# Patient Record
Sex: Male | Born: 1950 | Race: Black or African American | Hispanic: No | Marital: Married | Smoking: Current every day smoker
Health system: Southern US, Community
[De-identification: ages and names within clinical notes are randomized; demographics above are authoritative.]

## PROBLEM LIST (undated history)

## (undated) DIAGNOSIS — N289 Disorder of kidney and ureter, unspecified: Secondary | ICD-10-CM

## (undated) DIAGNOSIS — E785 Hyperlipidemia, unspecified: Secondary | ICD-10-CM

## (undated) DIAGNOSIS — K219 Gastro-esophageal reflux disease without esophagitis: Secondary | ICD-10-CM

## (undated) DIAGNOSIS — I10 Essential (primary) hypertension: Secondary | ICD-10-CM

## (undated) DIAGNOSIS — G4700 Insomnia, unspecified: Secondary | ICD-10-CM

## (undated) DIAGNOSIS — K760 Fatty (change of) liver, not elsewhere classified: Secondary | ICD-10-CM

## (undated) HISTORY — DX: Essential (primary) hypertension: I10

## (undated) HISTORY — DX: Fatty (change of) liver, not elsewhere classified: K76.0

## (undated) HISTORY — DX: Hyperlipidemia, unspecified: E78.5

## (undated) HISTORY — PX: OTHER SURGICAL HISTORY: SHX169

## (undated) HISTORY — PX: BACK SURGERY: SHX140

---

## 2002-08-25 ENCOUNTER — Encounter: Payer: Self-pay | Admitting: Family Medicine

## 2002-08-25 ENCOUNTER — Ambulatory Visit (HOSPITAL_COMMUNITY): Admission: RE | Admit: 2002-08-25 | Discharge: 2002-08-25 | Payer: Self-pay | Admitting: Family Medicine

## 2002-09-06 ENCOUNTER — Ambulatory Visit (HOSPITAL_COMMUNITY): Admission: RE | Admit: 2002-09-06 | Discharge: 2002-09-06 | Payer: Self-pay | Admitting: Urology

## 2002-09-06 ENCOUNTER — Encounter: Payer: Self-pay | Admitting: Urology

## 2002-10-23 ENCOUNTER — Ambulatory Visit (HOSPITAL_COMMUNITY): Admission: RE | Admit: 2002-10-23 | Discharge: 2002-10-23 | Payer: Self-pay | Admitting: Urology

## 2002-10-23 ENCOUNTER — Encounter: Payer: Self-pay | Admitting: Urology

## 2003-04-01 ENCOUNTER — Emergency Department (HOSPITAL_COMMUNITY): Admission: EM | Admit: 2003-04-01 | Discharge: 2003-04-01 | Payer: Self-pay | Admitting: Emergency Medicine

## 2003-04-02 ENCOUNTER — Emergency Department (HOSPITAL_COMMUNITY): Admission: EM | Admit: 2003-04-02 | Discharge: 2003-04-02 | Payer: Self-pay | Admitting: Emergency Medicine

## 2006-04-12 ENCOUNTER — Ambulatory Visit (HOSPITAL_COMMUNITY): Admission: RE | Admit: 2006-04-12 | Discharge: 2006-04-12 | Payer: Self-pay | Admitting: Family Medicine

## 2006-04-15 ENCOUNTER — Ambulatory Visit (HOSPITAL_COMMUNITY): Admission: RE | Admit: 2006-04-15 | Discharge: 2006-04-15 | Payer: Self-pay | Admitting: Family Medicine

## 2007-01-13 HISTORY — PX: COLONOSCOPY: SHX174

## 2007-01-14 ENCOUNTER — Ambulatory Visit (HOSPITAL_COMMUNITY): Admission: RE | Admit: 2007-01-14 | Discharge: 2007-01-14 | Payer: Self-pay | Admitting: General Surgery

## 2007-02-21 ENCOUNTER — Emergency Department (HOSPITAL_COMMUNITY): Admission: EM | Admit: 2007-02-21 | Discharge: 2007-02-21 | Payer: Self-pay | Admitting: Emergency Medicine

## 2007-11-28 ENCOUNTER — Ambulatory Visit (HOSPITAL_COMMUNITY): Admission: RE | Admit: 2007-11-28 | Discharge: 2007-11-28 | Payer: Self-pay | Admitting: Family Medicine

## 2008-06-11 ENCOUNTER — Ambulatory Visit (HOSPITAL_COMMUNITY): Admission: RE | Admit: 2008-06-11 | Discharge: 2008-06-11 | Payer: Self-pay | Admitting: General Surgery

## 2008-06-29 ENCOUNTER — Ambulatory Visit (HOSPITAL_COMMUNITY): Admission: RE | Admit: 2008-06-29 | Discharge: 2008-06-29 | Payer: Self-pay | Admitting: Neurosurgery

## 2008-07-10 IMAGING — CR DG CHEST 1V PORT
1 series · 1 of 1 positions shown · non-contrast
Comparison: 04/15/2006

CLINICAL DATA: Chest pain hypertension.

CHEST - 1 VIEW

[view not recorded]
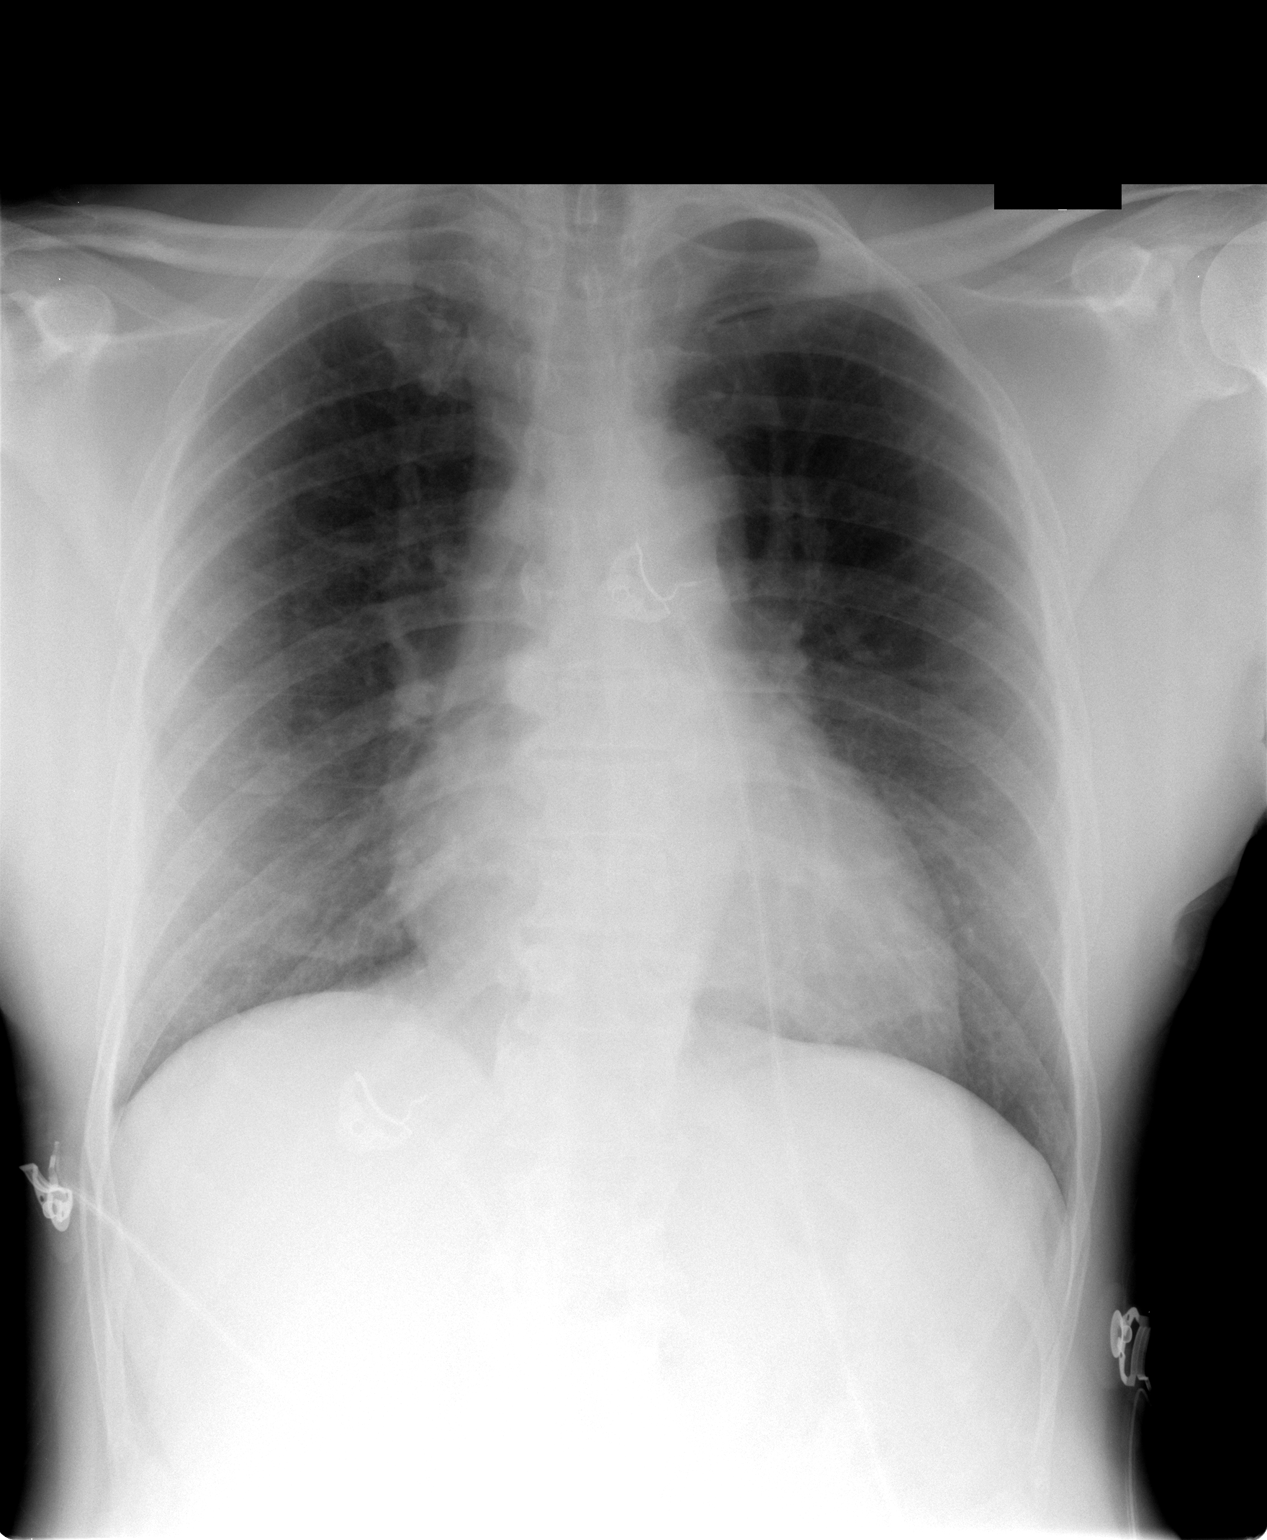

[1 of 1 positions shown; findings below may reference images not displayed]

FINDINGS: Mild S-shaped spinal curvature with advanced thoracic spondylosis.

Midline trachea. Mild cardiomegaly. Normal mediastinal contours. Sharp
costophrenic angles.

No pneumothorax or congestive failure. Clear lungs.

IMPRESSION

1. Mild cardiomegaly without acute cardiopulmonary disease.
2. Advanced thoracic spondylosis.

## 2008-08-16 ENCOUNTER — Ambulatory Visit (HOSPITAL_COMMUNITY): Admission: RE | Admit: 2008-08-16 | Discharge: 2008-08-17 | Payer: Self-pay | Admitting: Neurosurgery

## 2009-05-28 ENCOUNTER — Emergency Department (HOSPITAL_COMMUNITY): Admission: EM | Admit: 2009-05-28 | Discharge: 2009-05-28 | Payer: Self-pay | Admitting: Emergency Medicine

## 2009-12-27 ENCOUNTER — Ambulatory Visit (HOSPITAL_COMMUNITY): Admission: RE | Admit: 2009-12-27 | Discharge: 2009-12-27 | Payer: Self-pay | Admitting: Family Medicine

## 2010-11-28 ENCOUNTER — Ambulatory Visit (HOSPITAL_COMMUNITY)
Admission: RE | Admit: 2010-11-28 | Discharge: 2010-11-28 | Disposition: A | Discharge status: Home / Self Care | Payer: BC Managed Care – PPO | Source: Ambulatory Visit | Attending: Family Medicine | Admitting: Family Medicine

## 2010-11-28 ENCOUNTER — Other Ambulatory Visit (HOSPITAL_COMMUNITY): Payer: Self-pay | Admitting: Family Medicine

## 2010-11-28 DIAGNOSIS — K7689 Other specified diseases of liver: Secondary | ICD-10-CM | POA: Insufficient documentation

## 2010-11-28 DIAGNOSIS — R1011 Right upper quadrant pain: Secondary | ICD-10-CM

## 2010-11-28 DIAGNOSIS — R1013 Epigastric pain: Secondary | ICD-10-CM | POA: Insufficient documentation

## 2010-12-10 ENCOUNTER — Emergency Department (HOSPITAL_COMMUNITY)
Admission: EM | Admit: 2010-12-10 | Discharge: 2010-12-10 | Disposition: A | Discharge status: Home / Self Care | Payer: BC Managed Care – PPO | Attending: Emergency Medicine | Admitting: Emergency Medicine

## 2010-12-10 ENCOUNTER — Emergency Department (HOSPITAL_COMMUNITY): Payer: BC Managed Care – PPO

## 2010-12-10 DIAGNOSIS — Z79899 Other long term (current) drug therapy: Secondary | ICD-10-CM | POA: Insufficient documentation

## 2010-12-10 DIAGNOSIS — R10811 Right upper quadrant abdominal tenderness: Secondary | ICD-10-CM | POA: Insufficient documentation

## 2010-12-10 DIAGNOSIS — R609 Edema, unspecified: Secondary | ICD-10-CM | POA: Insufficient documentation

## 2010-12-10 DIAGNOSIS — E119 Type 2 diabetes mellitus without complications: Secondary | ICD-10-CM | POA: Insufficient documentation

## 2010-12-10 DIAGNOSIS — M2550 Pain in unspecified joint: Secondary | ICD-10-CM | POA: Insufficient documentation

## 2010-12-10 DIAGNOSIS — R109 Unspecified abdominal pain: Secondary | ICD-10-CM | POA: Insufficient documentation

## 2010-12-10 DIAGNOSIS — I10 Essential (primary) hypertension: Secondary | ICD-10-CM | POA: Insufficient documentation

## 2010-12-10 LAB — COMPREHENSIVE METABOLIC PANEL
ALT: 20 U/L (ref 0–53)
AST: 21 U/L (ref 0–37)
Albumin: 4.2 g/dL (ref 3.5–5.2)
Calcium: 9.7 mg/dL (ref 8.4–10.5)
GFR calc Af Amer: >60 mL/min (ref >60)
Sodium: 139 mEq/L (ref 135–145)
Total Protein: 7.8 g/dL (ref 6.0–8.3)

## 2010-12-10 LAB — URINALYSIS, ROUTINE W REFLEX MICROSCOPIC
Bilirubin Urine: NEGATIVE
Hgb urine dipstick: NEGATIVE
Nitrite: NEGATIVE
Specific Gravity, Urine: <1.005 — ABNORMAL LOW (ref 1.005–1.030)
pH: 5 (ref 5.0–8.0)

## 2010-12-10 LAB — CBC
MCHC: 33.2 g/dL (ref 30.0–36.0)
Platelets: 221 10*3/uL (ref 150–400)
RDW: 13.7 % (ref 11.5–15.5)

## 2010-12-10 LAB — DIFFERENTIAL
Basophils Absolute: 0.1 10*3/uL (ref 0.0–0.1)
Basophils Relative: 1 % (ref 0–1)
Eosinophils Relative: 2 % (ref 0–5)
Monocytes Absolute: 0.5 10*3/uL (ref 0.1–1.0)

## 2010-12-10 LAB — LIPASE, BLOOD: Lipase: 19 U/L (ref 11–59)

## 2010-12-10 MED ORDER — IOHEXOL 300 MG/ML  SOLN
100.0000 mL | Freq: Once | INTRAMUSCULAR | Status: AC | PRN
  Administered 2010-12-10: 100 mL via INTRAVENOUS

## 2011-01-07 ENCOUNTER — Encounter: Payer: Self-pay | Admitting: Gastroenterology

## 2011-01-07 ENCOUNTER — Ambulatory Visit (INDEPENDENT_AMBULATORY_CARE_PROVIDER_SITE_OTHER): Payer: BC Managed Care – PPO | Admitting: Gastroenterology

## 2011-01-07 VITALS — BP 190/100 | HR 83 | Temp 98.3°F | Ht 67.0 in | Wt 145.4 lb

## 2011-01-07 DIAGNOSIS — I10 Essential (primary) hypertension: Secondary | ICD-10-CM

## 2011-01-07 DIAGNOSIS — R1011 Right upper quadrant pain: Secondary | ICD-10-CM | POA: Insufficient documentation

## 2011-01-07 MED ORDER — IBUPROFEN 200 MG PO TABS: ORAL_TABLET | ORAL | Status: DC

## 2011-01-07 MED ORDER — ESOMEPRAZOLE MAGNESIUM 40 MG PO CPDR: 40.0000 mg | DELAYED_RELEASE_CAPSULE | Freq: Every day | ORAL | Status: DC

## 2011-01-07 NOTE — Progress Notes (Signed)
Primary Care Physician:  Colette Ribas, MD  Primary Gastroenterologist:  Dr. Roetta Sessions  Chief Complaint  Patient presents with  . Abdominal Pain    right side    HPI:  Chase Briggs is a 60 y.o. male here for further evaluation right upper quadrant abdominal pain of 3 weeks' duration. He saw Dr. Phillips Odor for this pain .LUQ pain began about 3 week or so. Burning pain. Notices more with heavy lifting. Some better with ace bandage applied. Sometimes feels little sore there. No heartburn. Took pill for indigestion for three days, given by ED. No n/v. BM regular. No melena, brbpr. Appetite not good for long time. Weight stable. No dysuria, hematuria. No rash. Taking some ibuprofen occasionally for this pain. Not pp.   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 Lumbar surgery two years ago, sometimes feels pain on right side (flank).    Abdominal ultrasound 11/28/2010 showed hepatic steatosis but otherwise unremarkable. 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 Urinalysis was negative. Lipase 19. LFTs normal. CBC normal.  Current Outpatient Prescriptions  Medication Sig Dispense Refill  . amLODipine-valsartan (EXFORGE) 5-320 MG per tablet Take 1 tablet by mouth daily.        Marland Kitchen glyBURIDE-metformin (GLUCOVANCE) 5-500 MG per tablet Take 1 tablet by mouth daily with breakfast.        . esomeprazole (NEXIUM) 40 MG capsule Take 1 capsule (40 mg total) by mouth daily.  30 capsule  0  . ibuprofen (ADVIL) 200 MG tablet Take two tablets three times daily for ten days.      . simvastatin (ZOCOR) 20 MG tablet Takes it every now again but not everyday        Allergies as of 01/07/2011  . (No Known Allergies)    Past Medical History  Diagnosis Date  . HTN (hypertension)   . Diabetes mellitus   . Hyperlipidemia     Past Surgical History  Procedure Date  . Back surgery   . Colonoscopy     Dr. [name], normal, per  patient around 2010    Family History  Problem Relation Age of Onset  . Colon cancer Neg Hx   . Liver disease Neg Hx     History   Social History  . Marital Status: Married    Spouse Name: N/A    Number of Children: 1  . Years of Education: N/A   Occupational History  .      [employer], heavy lifting   Social History Main Topics  . Smoking status: Current Everyday Smoker -- 1.0 packs/day  . Smokeless tobacco: Not on file  . Alcohol Use: No  . Drug Use: No  . Sexually Active: Not on file      ROS:  General: Negative for anorexia, weight loss, fever, chills, fatigue, weakness. Eyes: Negative for vision changes.  ENT: Negative for hoarseness, difficulty swallowing , nasal congestion. CV: Negative for chest pain, angina, palpitations, dyspnea on exertion, peripheral edema.  Respiratory: Negative for dyspnea at rest, dyspnea on exertion, cough, sputum, wheezing.  GI: See history of present illness. GU:  Negative for dysuria, hematuria, urinary incontinence, urinary frequency, nocturnal urination.  MS: Negative for joint pain. Intermittent low back pain especially in am.  Derm: Negative for rash or itching.  Neuro: Negative for weakness, abnormal sensation, seizure, frequent headaches, memory loss, confusion.  Psych: Negative for anxiety, depression, suicidal ideation, hallucinations.  Endo: Negative for unusual weight change.  Heme:  Negative for bruising or bleeding. Allergy: Negative for rash or hives.    Physical Examination:  BP 190/100  Pulse 83  Temp(Src) 98.3 F (36.8 C) (Tympanic)  Ht 5\' 7"  (1.702 m)  Wt 145 lb 6.4 oz (65.953 kg)  BMI 22.77 kg/m2  SpO2 100%   General: Well-nourished, well-developed in no acute distress.  Head: Normocephalic, atraumatic.   Eyes: Conjunctiva pink, no icterus. Mouth: Oropharyngeal mucosa moist and pink , no lesions erythema or exudate. Neck: Supple without thyromegaly, masses, or lymphadenopathy.  Lungs: Clear to auscultation  bilaterally.  Heart: Regular rate and rhythm, no murmurs rubs or gallops.  Abdomen: Bowel sounds are normal, nontender, nondistended, no hepatosplenomegaly or masses, no abdominal bruits or hernia , no rebound or guarding.   Extremities: No lower extremity edema.  Neuro: Alert and oriented x 4 , grossly normal neurologically.  Skin: Warm and dry, no rash or jaundice.   Psych: Alert and cooperative, normal mood and affect.

## 2011-01-07 NOTE — Assessment & Plan Note (Signed)
Blood pressure up this visit. Patient states he has "whitecoat syndrome". He will check his blood pressures at home and if remain elevated with systolic greater than 160 or diastolic greater than 100 he will call his PCP or go to the emergency department if elevated like it is currently. He will keep a record and discuss further with his PCP.

## 2011-01-07 NOTE — Assessment & Plan Note (Signed)
Three-week history of right upper quadrant abdominal pain, worse with movement, unrelated to meals. Suspect musculoskeletal source. Abdominal ultrasound and abdominal pelvic CT felt to reveal a source of his pain. Ibuprofen 400 mg 3 times a day for 10 days. Nexium 40 mg daily for one month. Samples provided. He will call with persistent symptoms.

## 2011-01-07 NOTE — Patient Instructions (Signed)
I suspect your right-sided pain is due to musculoskeletal component.  Please take ibuprofen 400 mg 3 times a day for the next 10 days. Hold for worsening abdominal pain. Take Nexium 40 mg daily for the next 30 days. This will protect her stomach as well as treat any gastritis or GERD. Call if symptoms persist.

## 2011-01-08 NOTE — Progress Notes (Signed)
Cc to PCP 

## 2011-01-27 NOTE — Op Note (Signed)
NAME:  Chase Briggs, Chase Briggs          ACCOUNT NO.:  [id number]   MEDICAL RECORD NO.:  [id number]          PATIENT TYPE:  OIB   LOCATION:  3535                         FACILITY:  [facility]   PHYSICIAN:  Danae Orleans. Venetia Maxon, M.D.  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   PROCEDURE:  Right L3-4 far-lateral microdiskectomy with microdissection.   SURGEON:  Danae Orleans. Venetia Maxon, MD   ASSISTANT:  Georgiann Cocker, RN and Clydene Fake, MD   ANESTHESIA:  General endotracheal anesthesia.   BLOOD LOSS:  Minimal.   COMPLICATIONS:  None.   DISPOSITION:  Recovery.   INDICATIONS:  Chase Briggs is a 60 year old man with right L3  radiculopathy with a large extraforaminal disk herniation at the L3-4  level.  It was elected to take him to surgery for far-lateral right L3-4  microdiskectomy.   PROCEDURE:  Chase Briggs was brought to the operating room.  Following satisfactory and uncomplicated induction of general  endotracheal anesthesia and placement of intravenous lines, the patient  was placed in the prone position on the operating table using the Wilson  frame.  His low back was prepped and draped in the usual sterile fashion  using C-arm fluoroscopy.  The L3-4 level was identified and incision was  made in the midline after infiltrating the skin with local lidocaine to  the lumbodorsal fascia which was incised sharply on the right side of  midline and flapped medially.  Subperiosteal dissection was performed  exposing the L3-4 and L2-3 facet joints and the L3 pars.  The self-  retaining retractor was placed and intraoperative x-ray confirmed  correct  orientation.  The outer rim of the pars was then drilled away  along with the superior aspect of the L3-4 facet joint.  Microscope was  brought into field, 2 and 3-mm Kerrison rongeurs were used to complete  bone removal.  The intertransverse ligament was then incised and removed  in piecemeal fashion with Kerrison rongeurs exposing the L3 nerve root  extraforaminally.  This was mobilized very gently laterally to expose  the interspace where there was a significant amount of herniated disk  material.  The interspace was incised with 15 blade and multiple large  fragments of disk material including more laterally.  The herniated disk  materials were removed.  Epstein curettes were used to gently remove any  residual adherent to the disk material.  A variety ball probes were then  inserted along the course of the nerve and this was felt to be  significantly decompressed.  Hemostasis was assured with Gelfoam soaked  in thrombin and was irrigated and bathed in Depo-Medrol and fentanyl.  The fascia was closed with 0 Vicryl sutures.  The subcutaneous tissues  were reapproximated with 2-0 Vicryl interrupted inverted sutures and  skin edges were  reapproximated with interrupted 3-0 Vicryl subcuticular stitch.  The  wound was dressed with Dermabond.  The patient was extubated in the  operating room and taken to recovery in stable and satisfactory  condition having tolerated the procedure well.  Counts were correct at  the end of the case.      Danae Orleans. Venetia Maxon, M.D.  Electronically Signed     JDS/MEDQ  D:  08/16/2008  T:  08/17/2008  Job:  [id number]

## 2011-01-27 NOTE — H&P (Signed)
NAME:  Chase Briggs, Chase Briggs          ACCOUNT NO.:  [id number]   MEDICAL RECORD NO.:  [id number]         PATIENT TYPE:  PAMB   LOCATION:  DAY                           FACILITY:  [facility]   PHYSICIAN:  [name], M.D.  DATE OF BIRTH:  1951-05-10   DATE OF ADMISSION:  DATE OF DISCHARGE:  LH                              HISTORY & PHYSICAL   CHIEF COMPLAINT:  Sebaceous cyst, scalp.   HISTORY OF PRESENT ILLNESS:  The patient is a 60 year old black male who  presents with an enlarging sebaceous cyst on the left posterior aspect  of his scalp.  It is causing him discomfort.  No recent drainage has  been noted.   PAST MEDICAL HISTORY:  1. Noninsulin-dependent diabetes mellitus.  2. Hypertension.   PAST SURGICAL HISTORY:  Colonoscopy.   CURRENT MEDICATIONS:  Glyburide/metformin, Benicar.   ALLERGIES:  No known drug allergies.   REVIEW OF SYSTEMS:  The patient smokes 1/2 pack of cigarettes a day.  Denies any alcohol use.  Denies any other cardiopulmonary difficulties  or bleeding disorders.   PHYSICAL EXAMINATION:  GENERAL:  The patient is a well-developed, well-  nourished black male in no acute distress.  LUNGS:  Clear to auscultation with equal breath sounds bilaterally.  HEART:  Regular rate and rhythm without S1, S3, or murmurs.  SCALP:  Reveals a 2-cm sebaceous cyst along the left posterior scalp  region.  No drainage is noted.   IMPRESSION:  Sebaceous cyst, scalp.   PLAN:  The patient is scheduled for excision of the sebaceous cyst on  the scalp on June 11, 2008.  The risks and benefits of the  procedure including bleeding, infection, and recurrence of the cyst were  fully explained to the patient, gave informed consent.      [name], M.D.  Electronically Signed     MAJ/MEDQ  D:  06/07/2008  T:  06/08/2008  Job:  [id number]   cc:   Patrica Duel, M.D.  Fax: [phone]

## 2011-01-27 NOTE — Op Note (Signed)
NAME:  Chase Briggs, Chase Briggs          ACCOUNT NO.:  [id number]   MEDICAL RECORD NO.:  [id number]          PATIENT TYPE:  AMB   LOCATION:  DAY                           FACILITY:  [facility]   PHYSICIAN:  [name], M.D.  DATE OF BIRTH:  02/02/1951   DATE OF PROCEDURE:  06/11/2008  DATE OF DISCHARGE:                               OPERATIVE REPORT   PREOPERATIVE DIAGNOSIS:  Sebaceous cyst, scalp.   POSTOPERATIVE DIAGNOSIS:  Sebaceous cyst, scalp.   PROCEDURE:  Excision of 2-cm sebaceous cyst, scalp.   SURGEON:  [name], MD   ANESTHESIA:  General.   INDICATIONS:  The patient is a 60 year old black male, who presents with  enlarging sebaceous cyst on the left posterior scalp region.  The risks  and benefits of the procedure including bleeding, infection, and  recurrence of the cyst were fully explained to the patient, who gave  informed consent.   PROCEDURE NOTE:  The patient was placed in the supine position.  After  general anesthesia was administered, the left posteroauricular scalp  region was prepped and draped using the usual sterile technique with  Betadine.  Surgical site confirmation was performed.   A 2-cm sebaceous cyst with punctum was present.  An elliptical incision  was made around the punctum.  The cyst was then removed without  difficulty.  The cyst was disposed off, thus no pathological specimen  was sent.  The wound was irrigated with normal saline.  Sensorcaine 0.5%  was instilled into the surrounding wound.  The incision was closed using  4-0 nylon interrupted sutures.  Betadine ointment and dry sterile  dressing were applied.   All tape and needle counts were correct at the end of the procedure.  The patient was awakened and transferred to PACU in stable condition.   COMPLICATIONS:  None.   SPECIMEN:  None.   BLOOD LOSS:  Minimal.      [name], M.D.  Electronically Signed     MAJ/MEDQ  D:  06/11/2008  T:  06/12/2008  Job:   [id number]   cc:   Patrica Duel, M.D.  Fax: [phone]

## 2011-01-30 NOTE — H&P (Signed)
NAME:  Chase Briggs, Chase Briggs          ACCOUNT NO.:  [id number]   MEDICAL RECORD NO.:  [id number]          PATIENT TYPE:  AMB   LOCATION:  DAY                           FACILITY:  [facility]   PHYSICIAN:  [name], M.D.  DATE OF BIRTH:  1951/02/21   DATE OF ADMISSION:  DATE OF DISCHARGE:  LH                              HISTORY & PHYSICAL   CHIEF COMPLAINT:  Need for screening colonoscopy.   HISTORY OF PRESENT ILLNESS:  The patient is a 60 year old black male who  was referred for endoscopic evaluation.  He needs a colonoscopy for  screening purposes.  No abdominal pain, weight loss, nausea, vomiting,  diarrhea, constipation, melena, or hematochezia have been noted.  He has  never had a colonoscopy.  There is no family history of colon carcinoma.   PAST MEDICAL HISTORY:  Includes hypertension and noninsulin dependent  diabetes mellitus.   PAST SURGICAL HISTORY:  Unremarkable.   CURRENT MEDICATIONS:  Amlodipine, Metoprolol, Glyburide,  hydrochlorothiazide, and simvastatin.   ALLERGIES:  NO KNOWN DRUG ALLERGIES.   REVIEW OF SYSTEMS:  The patient smokes daily.  Denies any alcohol use.  He denies any other cardiopulmonary difficulties or bleeding disorders.   PHYSICAL EXAMINATION:  GENERAL:  The patient is a well-developed, well-  nourished black male in no acute distress.  LUNGS:  Clear to auscultation with equal breath sounds bilaterally.  HEART:  The heart examination reveals a regular rate and rhythm without  S3, S4, or murmurs.  ABDOMEN:  Soft, nontender, nondistended.  No hepatosplenomegaly or  masses are noted.  RECTAL:  Examination deferred to the procedure.   IMPRESSION:  Need for screening colonoscopy.   PLAN:  The patient is scheduled for a colonoscopy on 01/21/2007.  The  risks and benefits of the procedure including bleeding and perforation  were fully explained to the patient, he gave informed consent.      [name], M.D.  Electronically Signed     MAJ/MEDQ  D:  01/06/2007  T:  01/06/2007  Job:  [id number]   cc:   Short Stay at [facility]   Patrica Duel, M.D.  Fax: [phone]

## 2011-05-20 ENCOUNTER — Ambulatory Visit (INDEPENDENT_AMBULATORY_CARE_PROVIDER_SITE_OTHER): Payer: BC Managed Care – PPO | Admitting: Gastroenterology

## 2011-05-20 ENCOUNTER — Encounter: Payer: Self-pay | Admitting: Gastroenterology

## 2011-05-20 DIAGNOSIS — R0789 Other chest pain: Secondary | ICD-10-CM

## 2011-05-20 DIAGNOSIS — R079 Chest pain, unspecified: Secondary | ICD-10-CM

## 2011-05-20 DIAGNOSIS — R1011 Right upper quadrant pain: Secondary | ICD-10-CM

## 2011-05-20 DIAGNOSIS — Z1211 Encounter for screening for malignant neoplasm of colon: Secondary | ICD-10-CM | POA: Insufficient documentation

## 2011-05-20 NOTE — Assessment & Plan Note (Signed)
Some musculoskeletal component but better on PPI. No alarm symptoms at this time. Continue lansoprazole one to two daily.

## 2011-05-20 NOTE — Patient Instructions (Signed)
Please take advil 200mg , 2-3 every 8 hours X 10 days for pain. Please take lansoprazole one tablet twice a day 30 minutes before breakfast and evening meal. Please call in two weeks and let us know how you are doing. If needed, we will order CXR at that time. If you develop difficulty breathing or worsening chest pain, go to ER.

## 2011-05-20 NOTE — Progress Notes (Signed)
Primary Care Physician: Colette Ribas, MD  Primary Gastroenterologist:  Jonette Eva, MD    Chief Complaint  Patient presents with  . Abdominal Pain    pain in the center of chest with sore under right breast    HPI: Chase Briggs is a 60 y.o. male here for f/u. Seen in 12/2010 for RUQ pain thought to be secondary to musculoskeletal source. Abd U/S and CT A/P unrevealing. Patient started on ibuprofen and Nexium. He states his pain went away after couple of weeks. Recently had some recurrent pain. Started lansoprazole OTC which seems to help. He is also concerned about pain that originates in sternum and goes down right ribs. No SOB. No diaphoresis. Worse with lifting. No n/v. BM normal . No melena, brbpr.   Current Outpatient Prescriptions  Medication Sig Dispense Refill  . amLODipine-valsartan (EXFORGE) 5-320 MG per tablet Take 1 tablet by mouth daily.        Marland Kitchen glyBURIDE-metformin (GLUCOVANCE) 5-500 MG per tablet Take 1 tablet by mouth daily with breakfast.        . ibuprofen (ADVIL) 200 MG tablet Take two tablets three times daily for ten days.      . lansoprazole (PREVACID) 15 MG capsule Take 15 mg by mouth daily.        . simvastatin (ZOCOR) 20 MG tablet Takes it every now again but not everyday        Allergies as of 05/20/2011  . (No Known Allergies)    ROS:  General: Negative for anorexia, weight loss, fever, chills, fatigue, weakness. ENT: Negative for hoarseness, difficulty swallowing , nasal congestion. CV: Negative for chest pain, angina, palpitations, dyspnea on exertion, peripheral edema.  Respiratory: Negative for dyspnea at rest, dyspnea on exertion, cough, sputum, wheezing.  GI: See history of present illness. GU:  Negative for dysuria, hematuria, urinary incontinence, urinary frequency, nocturnal urination.  Endo: Negative for unusual weight change.    Physical Examination:   BP 174/87  Pulse 86  Temp(Src) 97.7 F (36.5 C) (Temporal)  Ht 5\' 7"   (1.702 m)  Wt 144 lb 3.2 oz (65.409 kg)  BMI 22.59 kg/m2  General: Well-nourished, well-developed in no acute distress.  Eyes: No icterus. Mouth: Oropharyngeal mucosa moist and pink , no lesions erythema or exudate. Lungs: Clear to auscultation bilaterally. Palpation over sternum and chest wall without obvious abnormalities. Reproduction of tenderness notes.  Heart: Regular rate and rhythm, no murmurs rubs or gallops.  Abdomen: Bowel sounds are normal, nontender, nondistended, no hepatosplenomegaly or masses, no abdominal bruits or hernia , no rebound or guarding.   Extremities: No lower extremity edema. No clubbing or deformities. Neuro: Alert and oriented x 4   Skin: Warm and dry, no jaundice.   Psych: Alert and cooperative, normal mood and affect.

## 2011-05-20 NOTE — Assessment & Plan Note (Signed)
Ibuprofen 400-600mg  TID for 10 days. He will call if no better. Consider CXR as next step.

## 2011-05-20 NOTE — Assessment & Plan Note (Signed)
Patient states last TCS was in 2010 by Dr. [name]. Retrieve report.

## 2011-05-21 NOTE — Progress Notes (Signed)
agree

## 2011-05-21 NOTE — Progress Notes (Signed)
Cc to PCP 

## 2011-05-28 NOTE — Progress Notes (Signed)
Please let pt know, we have tried to find record of colonoscopy he said he had done by Dr. [name] in 2010. [facility] states there are no records. Please find out more information from pt or have him to get copy of report so further recommendations can be made regarding when next TCS is due.

## 2011-05-29 NOTE — Progress Notes (Signed)
Spoke with Debbie @ Dr. [name]. Pt had a colonoscopy in 2008 by Dr. [name], no biopsy.   She is faxing over the report. ( Dr. [name] did see him in 2009 for a cyst on his scalp)

## 2011-06-15 ENCOUNTER — Encounter: Payer: Self-pay | Admitting: Gastroenterology

## 2011-06-15 LAB — CBC
Hemoglobin: 13.8
MCHC: 34.1
RBC: 4.18 — ABNORMAL LOW

## 2011-06-15 LAB — BASIC METABOLIC PANEL
CO2: 28
Calcium: 9.9
Glucose, Bld: 165 — ABNORMAL HIGH
Sodium: 137

## 2011-06-15 NOTE — Progress Notes (Signed)
Please let pt know. Reviewed record from Dr. [name]. Next TCS due 01/2017. Please NIC.

## 2011-06-15 NOTE — Progress Notes (Signed)
Pt informed. Will let Darl Pikes know to NIC.

## 2011-06-16 LAB — BASIC METABOLIC PANEL
BUN: 9 mg/dL (ref 6–23)
CO2: 26 mEq/L (ref 19–32)
Chloride: 100 mEq/L (ref 96–112)
Glucose, Bld: 325 mg/dL — ABNORMAL HIGH (ref 70–99)
Potassium: 4 mEq/L (ref 3.5–5.1)

## 2011-06-16 LAB — CBC
HCT: 45.7 % (ref 39.0–52.0)
MCHC: 33.2 g/dL (ref 30.0–36.0)
MCV: 96.6 fL (ref 78.0–100.0)
Platelets: 190 10*3/uL (ref 150–400)
RDW: 13.3 % (ref 11.5–15.5)

## 2011-06-16 NOTE — Progress Notes (Signed)
Reminder in epic to have next tcs in 01/2017

## 2011-06-19 ENCOUNTER — Encounter: Payer: Self-pay | Admitting: General Surgery

## 2011-06-19 LAB — GLUCOSE, CAPILLARY
Glucose-Capillary: 248 mg/dL — ABNORMAL HIGH (ref 70–99)
Glucose-Capillary: 276 mg/dL — ABNORMAL HIGH (ref 70–99)

## 2011-07-02 LAB — BASIC METABOLIC PANEL
GFR calc non Af Amer: >60
Glucose, Bld: 197 — ABNORMAL HIGH
Potassium: 3.5
Sodium: 137

## 2011-07-02 LAB — POCT CARDIAC MARKERS
CKMB, poc: 4.7
Troponin i, poc: <0.05

## 2011-07-02 LAB — DIFFERENTIAL
Basophils Absolute: 0.1
Eosinophils Relative: 1
Lymphocytes Relative: 22
Lymphs Abs: 2.6
Monocytes Absolute: 0.6
Monocytes Relative: 5

## 2011-07-02 LAB — CBC
HCT: 39.9
Hemoglobin: 13.8
RDW: 13.7

## 2011-07-07 ENCOUNTER — Other Ambulatory Visit (HOSPITAL_COMMUNITY): Payer: Self-pay | Admitting: Rehabilitation

## 2011-07-07 DIAGNOSIS — M545 Low back pain: Secondary | ICD-10-CM

## 2011-07-07 DIAGNOSIS — M5137 Other intervertebral disc degeneration, lumbosacral region: Secondary | ICD-10-CM

## 2011-07-10 ENCOUNTER — Ambulatory Visit (HOSPITAL_COMMUNITY)
Admission: RE | Admit: 2011-07-10 | Discharge: 2011-07-10 | Disposition: A | Discharge status: Home / Self Care | Payer: BC Managed Care – PPO | Source: Ambulatory Visit | Attending: Rehabilitation | Admitting: Rehabilitation

## 2011-07-10 DIAGNOSIS — M5137 Other intervertebral disc degeneration, lumbosacral region: Secondary | ICD-10-CM

## 2011-07-10 DIAGNOSIS — M545 Low back pain, unspecified: Secondary | ICD-10-CM | POA: Insufficient documentation

## 2011-07-10 DIAGNOSIS — M25559 Pain in unspecified hip: Secondary | ICD-10-CM | POA: Insufficient documentation

## 2011-07-10 DIAGNOSIS — M5126 Other intervertebral disc displacement, lumbar region: Secondary | ICD-10-CM | POA: Insufficient documentation

## 2011-07-10 DIAGNOSIS — M79609 Pain in unspecified limb: Secondary | ICD-10-CM | POA: Insufficient documentation

## 2012-08-19 ENCOUNTER — Other Ambulatory Visit (HOSPITAL_COMMUNITY): Payer: Self-pay | Admitting: Internal Medicine

## 2012-08-19 DIAGNOSIS — R1011 Right upper quadrant pain: Secondary | ICD-10-CM

## 2012-08-23 ENCOUNTER — Ambulatory Visit (HOSPITAL_COMMUNITY)
Admission: RE | Admit: 2012-08-23 | Discharge: 2012-08-23 | Disposition: A | Discharge status: Home / Self Care | Payer: BC Managed Care – PPO | Source: Ambulatory Visit | Attending: Internal Medicine | Admitting: Internal Medicine

## 2012-08-23 DIAGNOSIS — R1011 Right upper quadrant pain: Secondary | ICD-10-CM | POA: Insufficient documentation

## 2012-08-23 DIAGNOSIS — I10 Essential (primary) hypertension: Secondary | ICD-10-CM | POA: Insufficient documentation

## 2012-08-23 DIAGNOSIS — E119 Type 2 diabetes mellitus without complications: Secondary | ICD-10-CM | POA: Insufficient documentation

## 2012-09-21 ENCOUNTER — Ambulatory Visit (INDEPENDENT_AMBULATORY_CARE_PROVIDER_SITE_OTHER): Payer: BC Managed Care – PPO | Admitting: Urgent Care

## 2012-09-21 ENCOUNTER — Encounter: Payer: Self-pay | Admitting: Urgent Care

## 2012-09-21 VITALS — BP 160/82 | HR 81 | Temp 98.3°F | Ht 67.0 in | Wt 137.6 lb

## 2012-09-21 DIAGNOSIS — K7689 Other specified diseases of liver: Secondary | ICD-10-CM

## 2012-09-21 DIAGNOSIS — Z1211 Encounter for screening for malignant neoplasm of colon: Secondary | ICD-10-CM

## 2012-09-21 DIAGNOSIS — R634 Abnormal weight loss: Secondary | ICD-10-CM

## 2012-09-21 DIAGNOSIS — K760 Fatty (change of) liver, not elsewhere classified: Secondary | ICD-10-CM

## 2012-09-21 DIAGNOSIS — R1011 Right upper quadrant pain: Secondary | ICD-10-CM

## 2012-09-21 LAB — HEPATIC FUNCTION PANEL
Bilirubin, Direct: 0.1 mg/dL (ref 0.0–0.3)
Indirect Bilirubin: 0.4 mg/dL (ref 0.0–0.9)
Total Bilirubin: 0.5 mg/dL (ref 0.3–1.2)

## 2012-09-21 MED ORDER — OMEPRAZOLE 20 MG PO CPDR: 20.0000 mg | DELAYED_RELEASE_CAPSULE | Freq: Every day | ORAL | Status: DC

## 2012-09-21 NOTE — Assessment & Plan Note (Signed)
Unintentional weight loss of 7# documented in past 3 months per our records.  Further evaluation warranted.  See RUQ pain.

## 2012-09-21 NOTE — Progress Notes (Signed)
Referring Provider: Corrie Mckusick, MD Primary Care Physician:  Colette Ribas, MD Primary Gastroenterologist:  Dr. Jonette Eva  Chief Complaint  Patient presents with  . Abdominal Pain    chronic RUQ pain, wt loss, fatty liver    HPI:  Chase Briggs is a 62 y.o. male here for follow up for RUQ pain.  He has chronic RUQ pain that has been worse for the past month.  He describes his pain as daily pain, but it is intermittent.  Pain not necessarily worse after eating, but may be.  Pain is better when he is supine.  Pain radiates to back.  Pain lingers.  Pain 8/10 at worst, usually lasts minutes to hours.  Occasionally feels like pins & needles.  Worse w/ movement.  He lifts & carries stuff at work & it makes it worse.  Denies nausea or vomiting.  Denies fever.  Denies heartburn or indigestion so long as he takes OTC omeprazole 20mg  daily.  He occasionally skips doses.  He has never had an EGD.  He is having BMs BID without rectal bleeding or melena.  Denies diarrhea or constipation.  He has poor appetite.  He has had weight loss 7# since October.  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  Rarely drinks now.  Last ETOH Aug 14, 2012.  Denies illicit drug use.  Diabetes not very well controlled.  However, last Hgb a1c was ok he thinks.  Last labs [facility] 3 mo ago.  Denies cough or SOB.    08/23/12 Abdominal ultrasound-Echogenic liver consistent with fatty infiltration. No focal abnormality. No gallstones.  Past Medical History  Diagnosis Date  . HTN (hypertension)   . Diabetes mellitus   . Hyperlipidemia   . Fatty liver     Past Surgical History  Procedure Date  . Back surgery   . Colonoscopy 01/2007    Dr. [name], reviewed report, mild diverticulosis. Prep adequate. Next TCS due 01/2017    Current Outpatient Prescriptions  Medication Sig Dispense Refill  . amLODipine-valsartan (EXFORGE) 5-320 MG per tablet Take 1 tablet by mouth daily.          Marland Kitchen glyBURIDE-metformin (GLUCOVANCE) 5-500 MG per tablet Take 1 tablet by mouth daily with breakfast.        . ibuprofen (ADVIL) 200 MG tablet Take two tablets three times daily for ten days.      Marland Kitchen omeprazole (PRILOSEC) 20 MG capsule Take 1 capsule (20 mg total) by mouth daily.  31 capsule  2  . simvastatin (ZOCOR) 20 MG tablet Takes it every now again but not everyday        Allergies as of 09/21/2012  . (No Known Allergies)    Review of Systems: Gen: see HPI CV: Denies chest pain, angina, palpitations, syncope, orthopnea, PND, peripheral edema, and claudication. Resp: Denies dyspnea at rest, dyspnea with exercise, cough, sputum, wheezing, coughing up blood, and pleurisy. GI: Denies vomiting blood, jaundice, and fecal incontinence.    Derm: Denies rash, itching, dry skin, hives, moles, warts, or unhealing ulcers.  Psych: Denies depression, anxiety, memory loss, suicidal ideation, hallucinations, paranoia, and confusion. Heme: Denies bruising, bleeding, and enlarged lymph nodes.  Physical Exam: BP 160/82  Pulse 81  Temp 98.3 F (36.8 C) (Oral)  Ht 5\' 7"  (1.702 m)  Wt 137 lb 9.6 oz (62.415 kg)  BMI 21.55 kg/m2 General:   Alert,  thin, pleasant and cooperative in NAD Eyes:  Sclera  clear, no icterus.   Conjunctiva pink. Mouth:  No deformity or lesions, oropharynx pink and moist. Neck:  Supple; no masses or thyromegaly. Heart:  Regular rate and rhythm; no murmurs, clicks, rubs,  or gallops. Abdomen:  Normal bowel sounds.  No bruits.  Soft,non-distended without masses, hepatosplenomegaly or hernias noted.  +mild RUQ tenderness on deep palpation.  Negative Murphy's.  No guarding or rebound tenderness.  Negative Carnett sign.  No CVAT.  Non-tender right costal margin. Rectal:  Deferred.  Msk:  Symmetrical without gross deformities.  Pulses:  Normal pulses noted. Extremities:  + clubbing.  No edema. Neurologic:  Alert and oriented x4;  grossly normal neurologically. Skin:  Intact  without significant lesions or rashes.

## 2012-09-21 NOTE — Assessment & Plan Note (Addendum)
Chronic RUQ pain ? Etiology. Differentials include biliary dyskinesia, gastritis or PUD, vs musculoskeletal component.  Fatty liver noted on otherwise normal ultrasound.  Unintentional weight loss is concerning.   HIDA scan if no symptom reproduction, will arrange EGD w/ Dr Darrick Penna. Omeprazole 20mg  30 min before breakfast daily Instructions for fatty liver: Talk with your doctor about blood sugar control with your diabetes Low fat/cholesterol diet.   Avoid sweets, sodas, fruit juices, sweetened beverages like tea, etc. Gradually increase exercise from 15 min daily up to 1 hr per day 5 days/week. Limit alcohol use. We have requested your recent labs from [place] to review

## 2012-09-21 NOTE — Progress Notes (Signed)
Faxed to PCP

## 2012-09-21 NOTE — Patient Instructions (Addendum)
Office visit with Chase Ribas, MD to discuss your blood pressure Go to lab to get blood drawn as soon as possible.  We will call you with results. Omeprazole 20mg  30 min before breakfast daily Instructions for fatty liver: Talk with your doctor about blood sugar control with your diabetes Low fat/cholesterol diet.   Avoid sweets, sodas, fruit juices, sweetened beverages like tea, etc. Gradually increase exercise from 15 min daily up to 1 hr per day 5 days/week. Limit alcohol use. We have requested your recent labs from [facility] to review Next screening colonoscopy May 2018

## 2012-09-22 NOTE — Progress Notes (Signed)
Quick Note:  Please let pt know liver tests normal Will call once gallbladder reviewed [id number], Chancy Hurter, MD   ______

## 2012-09-23 NOTE — Progress Notes (Signed)
Quick Note:  LMOM to Call. ______

## 2012-09-26 NOTE — Progress Notes (Signed)
Quick Note:  LMOM to call. ______ 

## 2012-09-27 ENCOUNTER — Telehealth: Payer: Self-pay | Admitting: Urgent Care

## 2012-09-27 NOTE — Telephone Encounter (Signed)
A user error has taken place: encounter opened in error, closed for administrative reasons.

## 2012-09-28 ENCOUNTER — Telehealth: Payer: Self-pay | Admitting: *Deleted

## 2012-09-28 NOTE — Telephone Encounter (Signed)
Routing to Soledad Gerlach who is scheduling his test for Gallbladder.

## 2012-09-28 NOTE — Telephone Encounter (Signed)
Chase Briggs called you back. He is at work and will try to reach you again around 2:30. Thanks.

## 2012-09-30 ENCOUNTER — Ambulatory Visit (HOSPITAL_COMMUNITY)
Admission: RE | Admit: 2012-09-30 | Discharge: 2012-09-30 | Disposition: A | Discharge status: Home / Self Care | Payer: BC Managed Care – PPO | Source: Ambulatory Visit | Attending: Family Medicine | Admitting: Family Medicine

## 2012-09-30 ENCOUNTER — Other Ambulatory Visit (HOSPITAL_COMMUNITY): Payer: Self-pay | Admitting: Family Medicine

## 2012-09-30 DIAGNOSIS — J449 Chronic obstructive pulmonary disease, unspecified: Secondary | ICD-10-CM | POA: Insufficient documentation

## 2012-09-30 DIAGNOSIS — R634 Abnormal weight loss: Secondary | ICD-10-CM | POA: Insufficient documentation

## 2012-09-30 DIAGNOSIS — R05 Cough: Secondary | ICD-10-CM

## 2012-09-30 DIAGNOSIS — F17200 Nicotine dependence, unspecified, uncomplicated: Secondary | ICD-10-CM

## 2012-09-30 DIAGNOSIS — J4489 Other specified chronic obstructive pulmonary disease: Secondary | ICD-10-CM | POA: Insufficient documentation

## 2012-09-30 DIAGNOSIS — R059 Cough, unspecified: Secondary | ICD-10-CM | POA: Insufficient documentation

## 2012-10-07 ENCOUNTER — Encounter (HOSPITAL_COMMUNITY): Payer: Self-pay

## 2012-10-07 ENCOUNTER — Encounter (HOSPITAL_COMMUNITY)
Admission: RE | Admit: 2012-10-07 | Discharge: 2012-10-07 | Disposition: A | Discharge status: Home / Self Care | Payer: BC Managed Care – PPO | Source: Ambulatory Visit | Attending: Urgent Care | Admitting: Urgent Care

## 2012-10-07 DIAGNOSIS — R1011 Right upper quadrant pain: Secondary | ICD-10-CM | POA: Insufficient documentation

## 2012-10-07 MED ORDER — TECHNETIUM TC 99M MEBROFENIN IV KIT
5.0000 | PACK | Freq: Once | INTRAVENOUS | Status: AC | PRN
  Administered 2012-10-07: 5 via INTRAVENOUS

## 2012-10-07 NOTE — Progress Notes (Signed)
Quick Note:  LMOM for pt to return call. His gallbladder function is normal. He should not take any advil, aleve, bc powders or anti-inflammatories I recommend EGD w/ Dr Darrick Penna as next step re: Upper abdominal pain & weight loss. If nothing on EGD, suggest CT scan. Continue PPI I can speak with him if he returns call. Thanks JW:[id number], Chancy Hurter, MD  ______

## 2012-10-10 ENCOUNTER — Other Ambulatory Visit: Payer: Self-pay | Admitting: Gastroenterology

## 2012-10-10 DIAGNOSIS — R634 Abnormal weight loss: Secondary | ICD-10-CM

## 2012-10-10 DIAGNOSIS — R1011 Right upper quadrant pain: Secondary | ICD-10-CM

## 2012-10-10 NOTE — Progress Notes (Signed)
Quick Note:  Pt returned call and was informed. OK to schedule EGD and can leave a message on his machine. ______

## 2012-10-10 NOTE — Progress Notes (Signed)
Quick Note:  LMOM to call. ______ 

## 2012-10-10 NOTE — Progress Notes (Signed)
Faxed to PCP

## 2012-10-10 NOTE — Progress Notes (Signed)
Patient is scheduled for EGD with [facility] on Friday 02/07 at 11:30 and instructions have been mailed

## 2012-10-13 ENCOUNTER — Encounter (HOSPITAL_COMMUNITY): Payer: Self-pay | Admitting: Pharmacy Technician

## 2012-10-20 ENCOUNTER — Encounter (HOSPITAL_COMMUNITY): Payer: Self-pay

## 2012-10-20 ENCOUNTER — Other Ambulatory Visit: Payer: Self-pay

## 2012-10-20 ENCOUNTER — Encounter (HOSPITAL_COMMUNITY): Payer: Self-pay | Admitting: Pharmacy Technician

## 2012-10-20 ENCOUNTER — Encounter (HOSPITAL_COMMUNITY)
Admission: RE | Admit: 2012-10-20 | Discharge: 2012-10-20 | Disposition: A | Discharge status: Home / Self Care | Payer: BC Managed Care – PPO | Source: Ambulatory Visit | Attending: General Surgery | Admitting: General Surgery

## 2012-10-20 HISTORY — DX: Gastro-esophageal reflux disease without esophagitis: K21.9

## 2012-10-20 LAB — CBC WITH DIFFERENTIAL/PLATELET
Basophils Relative: 1 % (ref 0–1)
Eosinophils Relative: 1 % (ref 0–5)
HCT: 41.6 % (ref 39.0–52.0)
Hemoglobin: 14.3 g/dL (ref 13.0–17.0)
Lymphs Abs: 2.9 10*3/uL (ref 0.7–4.0)
MCV: 93.7 fL (ref 78.0–100.0)
Monocytes Relative: 7 % (ref 3–12)
Platelets: ADEQUATE 10*3/uL (ref 150–400)
RBC: 4.44 MIL/uL (ref 4.22–5.81)
WBC: 8.5 10*3/uL (ref 4.0–10.5)

## 2012-10-20 LAB — BASIC METABOLIC PANEL
CO2: 26 mEq/L (ref 19–32)
Calcium: 10.2 mg/dL (ref 8.4–10.5)
Chloride: 101 mEq/L (ref 96–112)
Glucose, Bld: 192 mg/dL — ABNORMAL HIGH (ref 70–99)
Potassium: 4.7 mEq/L (ref 3.5–5.1)
Sodium: 137 mEq/L (ref 135–145)

## 2012-10-20 NOTE — Patient Instructions (Addendum)
     Chase Briggs  10/20/2012   Your procedure is scheduled on:  10/26/2012  Report to [facility] at  700 AM.  Call this number if you have problems the morning of surgery: [phone]   Remember:   Do not eat food or drink liquids after midnight.   Take these medicines the morning of surgery with A SIP OF WATER: exforge,prilosec   Do not wear jewelry, make-up or nail polish.  Do not wear lotions, powders, or perfumes.   Do not shave 48 hours prior to surgery. Men may shave face and neck.  Do not bring valuables to the hospital.  Contacts, dentures or bridgework may not be worn into surgery.  Leave suitcase in the car. After surgery it may be brought to your room.  For patients admitted to the hospital, checkout time is 11:00 AM the day of discharge.   Patients discharged the day of surgery will not be allowed to drive home.  Name and phone number of your driver: family  Special Instructions: Shower using CHG 2 nights before surgery and the night before surgery.  If you shower the day of surgery use CHG.  Use special wash - you have one bottle of CHG for all showers.  You should use approximately 1/3 of the bottle for each shower.   Please read over the following fact sheets that you were given: Pain Booklet, Coughing and Deep Breathing, MRSA Information, Surgical Site Infection Prevention, Anesthesia Post-op Instructions and Care and Recovery After Surgery  POST-ANESTHESIA  IMMEDIATELY FOLLOWING SURGERY:  Do not drive or operate machinery for the first twenty four hours after surgery.  Do not make any important decisions for twenty four hours after surgery or while taking narcotic pain medications or sedatives.  If you develop intractable nausea and vomiting or a severe headache please notify your doctor immediately.  FOLLOW-UP:  Please make an appointment with your surgeon as instructed. You do not need to follow up with anesthesia unless specifically instructed to do so.  WOUND  CARE INSTRUCTIONS (if applicable):  Keep a dry clean dressing on the anesthesia/puncture wound site if there is drainage.  Once the wound has quit draining you may leave it open to air.  Generally you should leave the bandage intact for twenty four hours unless there is drainage.  If the epidural site drains for more than 36-48 hours please call the anesthesia department.  QUESTIONS?:  Please feel free to call your physician or the hospital operator if you have any questions, and they will be happy to assist you.

## 2012-10-20 NOTE — H&P (Signed)
  NTS SOAP Note  Vital Signs:  Vitals as of: 10/20/2012: Systolic 163: Diastolic 92: Heart Rate 94: Temp 98.51F: Height 20ft 7.5in: Weight 140Lbs 0 Ounces: BMI 22  BMI : 21.6 kg/m2  Subjective: This 62 Years 65 Months old Male presents for a recurrent cyst on the scalp.  Had been removed in 2009, has recurred.  Enlarging in size.   Review of Symptoms:  Constitutional:unremarkable   Head:unremarkable    Eyes:unremarkable   Nose/Mouth/Throat:unremarkable Cardiovascular:  unremarkable   Respiratory:  cough Gastrointestin    abdominal pain Genitourinary:unremarkable     Musculoskeletal:unremarkable   Hematolgic/Lymphatic:unremarkable     Allergic/Immunologic:unremarkable     Past Medical History:    Reviewed   Past Medical History  Surgical History: TCS, scalp excision of cyst Medical Problems:  Diabetes, High Blood pressure Allergies: nkda Medications: janumet, exforge, ambien, lorcet   Social History:Reviewed  Social History  Preferred Language: English Race:  Black or African American Ethnicity: Hispanic / Latino Age: 62 Years 3 Months Marital Status:  S Alcohol:  No Recreational drug(s):  No   Smoking Status: Light tobacoo smoker reviewed on 10/20/2012 Started Date: 09/14/1970 Packs per day: 0.50 Functional Status reviewed on mm/dd/yyyy ------------------------------------------------ Bathing: Normal Cooking: Normal Dressing: Normal Driving: Normal Eating: Normal Managing Meds: Normal Oral Care: Normal Shopping: Normal Toileting: Normal Transferring: Normal Walking: Normal Cognitive Status reviewed on mm/dd/yyyy ------------------------------------------------ Attention: Normal Decision Making: Normal Language: Normal Memory: Normal Motor: Normal Perception: Normal Problem Solving: Normal Visual and Spatial: Normal   Family History:  Reviewed   Family History  Is there a family history  [id number]    Objective Information: General:  Well appearing, well nourished in no distress.      2cm cystic mass, left scalp.  No drainage noted.   Heart:  RRR, no murmur Lungs:    CTA bilaterally, no wheezes, rhonchi, rales.  Breathing unlabored.  Assessment:Inflamed cyst, scalp  Diagnosis &amp; Procedure:    Plan:Scheduled for excision of scalp mass on 10/26/12.   Patient Education:Alternative treatments to surgery were discussed with patient (and family).  Risks and benefits  of procedure were fully explained to the patient (and family) who gave informed consent. Patient/family questions were addressed.  Follow-up:Pending Surgery

## 2012-10-21 ENCOUNTER — Encounter (HOSPITAL_COMMUNITY)
Admission: RE | Disposition: A | Discharge status: Home / Self Care | Payer: Self-pay | Source: Ambulatory Visit | Attending: Gastroenterology

## 2012-10-21 ENCOUNTER — Ambulatory Visit (HOSPITAL_COMMUNITY)
Admission: RE | Admit: 2012-10-21 | Discharge: 2012-10-21 | Disposition: A | Discharge status: Home / Self Care | Payer: BC Managed Care – PPO | Source: Ambulatory Visit | Attending: Gastroenterology | Admitting: Gastroenterology

## 2012-10-21 ENCOUNTER — Encounter (HOSPITAL_COMMUNITY): Payer: Self-pay | Admitting: *Deleted

## 2012-10-21 DIAGNOSIS — R1011 Right upper quadrant pain: Secondary | ICD-10-CM

## 2012-10-21 DIAGNOSIS — I10 Essential (primary) hypertension: Secondary | ICD-10-CM | POA: Insufficient documentation

## 2012-10-21 DIAGNOSIS — E119 Type 2 diabetes mellitus without complications: Secondary | ICD-10-CM | POA: Insufficient documentation

## 2012-10-21 DIAGNOSIS — R634 Abnormal weight loss: Secondary | ICD-10-CM

## 2012-10-21 DIAGNOSIS — Z79899 Other long term (current) drug therapy: Secondary | ICD-10-CM | POA: Insufficient documentation

## 2012-10-21 DIAGNOSIS — K2970 Gastritis, unspecified, without bleeding: Secondary | ICD-10-CM

## 2012-10-21 DIAGNOSIS — Z01812 Encounter for preprocedural laboratory examination: Secondary | ICD-10-CM | POA: Insufficient documentation

## 2012-10-21 DIAGNOSIS — K2940 Chronic atrophic gastritis without bleeding: Secondary | ICD-10-CM | POA: Insufficient documentation

## 2012-10-21 DIAGNOSIS — K2990 Gastroduodenitis, unspecified, without bleeding: Secondary | ICD-10-CM

## 2012-10-21 HISTORY — PX: ESOPHAGOGASTRODUODENOSCOPY: SHX5428

## 2012-10-21 LAB — GLUCOSE, CAPILLARY: Glucose-Capillary: 155 mg/dL — ABNORMAL HIGH (ref 70–99)

## 2012-10-21 SURGERY — EGD (ESOPHAGOGASTRODUODENOSCOPY)
Anesthesia: Moderate Sedation

## 2012-10-21 MED ORDER — SODIUM CHLORIDE 0.45 % IV SOLN
INTRAVENOUS | Status: DC
  Administered 2012-10-21: 10:00:00 via INTRAVENOUS

## 2012-10-21 MED ORDER — MIDAZOLAM HCL 5 MG/5ML IJ SOLN
INTRAMUSCULAR | Status: AC
  Filled 2012-10-21: qty 10

## 2012-10-21 MED ORDER — MIDAZOLAM HCL 5 MG/5ML IJ SOLN
INTRAMUSCULAR | Status: DC | PRN
  Administered 2012-10-21: 2 mg via INTRAVENOUS
  Administered 2012-10-21 (×2): 1 mg via INTRAVENOUS

## 2012-10-21 MED ORDER — PROMETHAZINE HCL 25 MG/ML IJ SOLN
INTRAMUSCULAR | Status: AC
  Filled 2012-10-21: qty 1

## 2012-10-21 MED ORDER — MEPERIDINE HCL 100 MG/ML IJ SOLN
INTRAMUSCULAR | Status: DC | PRN
  Administered 2012-10-21: 50 mg via INTRAVENOUS
  Administered 2012-10-21: 25 mg via INTRAVENOUS

## 2012-10-21 MED ORDER — SODIUM CHLORIDE 0.9 % IJ SOLN
INTRAMUSCULAR | Status: AC
  Filled 2012-10-21: qty 10

## 2012-10-21 MED ORDER — MEPERIDINE HCL 100 MG/ML IJ SOLN
INTRAMUSCULAR | Status: AC
  Filled 2012-10-21: qty 2

## 2012-10-21 NOTE — Op Note (Addendum)
[facility] [address] [place], [address]   ENDOSCOPY PROCEDURE REPORT  PATIENT: Chase Briggs, Chase Briggs  MR#: [id number] BIRTHDATE: 1951-07-07 , 61  yrs. old GENDER: Male  ENDOSCOPIST: Jonette Eva, MD REFERRED MW:NUUV Phillips Odor, M.D. PROCEDURE DATE: 10/21/2012 PROCEDURE:   EGD w/ biopsy  INDICATIONS:RUQ ABDOMINAL PAIN, WEIGHT LOSS. LAST CT MAR 2012 DONE FOR EPIGASTRIC/RUQ PAIN: NAIAP, 2014:  ABD U/S-NL GB/PANCREAS, FATTY LIVER, HIDA- NL GB EF MEDICATIONS: Demerol 75 mg IV and Versed 4 mg IV TOPICAL ANESTHETIC:   Cetacaine Spray  DESCRIPTION OF PROCEDURE:     Physical exam was performed.  Informed consent was obtained from the patient after explaining the benefits, risks, and alternatives to the procedure.  The patient was connected to the monitor and placed in the left lateral position.  Continuous oxygen was provided by nasal cannula and IV medicine administered through an indwelling cannula.  After administration of sedation, the patients esophagus was intubated and the EG-[id number] ([id number])  endoscope was advanced under direct visualization to the second portion of the duodenum.  The scope was removed slowly by carefully examining the color, texture, anatomy, and integrity of the mucosa on the way out.  The patient was recovered in endoscopy and discharged home in satisfactory condition.   ESOPHAGUS: The mucosa of the esophagus appeared normal.   STOMACH: Moderate non-erosive gastritis (inflammation) was found in the gastric antrum.  Multiple biopsies were performed using cold forceps.   DUODENUM: The duodenal mucosa showed no abnormalities in the bulb and second portion of the duodenum. COMPLICATIONS:   None  ENDOSCOPIC IMPRESSION: MODERATE GASTRITIS  RECOMMENDATIONS: BID OMEPRAZOLE AWAIT BIOPSY LOW FAT DIET CT A/P W/ IV/ORAL CONTRAST NEXT WEEK OPV IN 3 MOS   REPEAT EXAM:   _______________________________ Rosalie DoctorJonette Eva, MD  10/21/2012 3:01 PM Revised: 10/21/2012 3:01 PM

## 2012-10-21 NOTE — H&P (Signed)
  Primary Care Physician:  Colette Ribas, MD Primary Gastroenterologist:  Dr. Darrick Penna  Pre-Procedure History & Physical: HPI:  Chase Briggs is a 62 y.o. male here for  ABDOMINAL PAIN/weight loss.  Past Medical History  Diagnosis Date  . HTN (hypertension)   . Diabetes mellitus   . Hyperlipidemia   . Fatty liver   . GERD (gastroesophageal reflux disease)    Past Surgical History  Procedure Date  . Back surgery   . Colonoscopy 01/2007    Dr. [name], reviewed report, mild diverticulosis. Prep adequate. Next TCS due 01/2017  . Scalp mass 4-5 yrs ago    Dr [name]    Prior to Admission medications   Medication Sig Start Date End Date Taking? Authorizing Provider  amLODipine-valsartan (EXFORGE) 5-320 MG per tablet Take 1 tablet by mouth daily.     Yes Historical Provider, MD  omeprazole (PRILOSEC) 20 MG capsule Take 1 capsule (20 mg total) by mouth daily. 09/21/12  Yes Joselyn Arrow, NP  simvastatin (ZOCOR) 20 MG tablet Take 20 mg by mouth every other day.  11/18/10  Yes Historical Provider, MD  sitaGLIPtan-metformin (JANUMET) 50-500 MG per tablet Take 1 tablet by mouth 2 (two) times daily with a meal.   Yes Historical Provider, MD  ibuprofen (ADVIL,MOTRIN) 200 MG tablet Take 400 mg by mouth every 6 (six) hours as needed. For pain 01/07/11   Tiffany Kocher, PA    Allergies as of 10/10/2012  . (No Known Allergies)    Family History  Problem Relation Age of Onset  . Colon cancer Neg Hx   . Liver disease Neg Hx     History   Social History  . Marital Status: Married    Spouse Name: N/A    Number of Children: 1  . Years of Education: N/A   Occupational History  .      [employer], heavy lifting   Social History Main Topics  . Smoking status: Current Every Day Smoker -- 0.5 packs/day for 25 years    Types: Cigarettes  . Smokeless tobacco: Not on file  . Alcohol Use: No     Comment: hx heavy etoh x 3 yrs, quit 30+ yrs ago  . Drug Use: No  . Sexually Active: Yes   Birth Control/ Protection: None   Other Topics Concern  . Not on file   Social History Narrative  . No narrative on file    Review of Systems: See HPI, otherwise negative ROS   Physical Exam: BP 172/92  Pulse 77  Temp 97.7 F (36.5 C) (Oral)  Resp 21  Ht 5' 7.5" (1.715 m)  Wt 137 lb (62.143 kg)  BMI 21.14 kg/m2  SpO2 99% General:   Alert,  pleasant and cooperative in NAD Head:  Normocephalic and atraumatic. Neck:  Supple; Lungs:  Clear throughout to auscultation.    Heart:  Regular rate and rhythm. Abdomen:  Soft, nontender and nondistended. Normal bowel sounds, without guarding, and without rebound.   Neurologic:  Alert and  oriented x4;  grossly normal neurologically.  Impression/Plan:     ABDOMINAL PAIN/WEIGHT LOSS  PLAN: 1. EGD TODAY

## 2012-10-24 ENCOUNTER — Ambulatory Visit (HOSPITAL_COMMUNITY)
Admit: 2012-10-24 | Discharge: 2012-10-24 | Disposition: A | Discharge status: Home / Self Care | Payer: BC Managed Care – PPO | Source: Ambulatory Visit | Attending: Gastroenterology | Admitting: Gastroenterology

## 2012-10-24 ENCOUNTER — Other Ambulatory Visit (HOSPITAL_COMMUNITY): Payer: BC Managed Care – PPO

## 2012-10-24 DIAGNOSIS — R1011 Right upper quadrant pain: Secondary | ICD-10-CM | POA: Insufficient documentation

## 2012-10-24 MED ORDER — IOHEXOL 300 MG/ML  SOLN
100.0000 mL | Freq: Once | INTRAMUSCULAR | Status: AC | PRN
  Administered 2012-10-24: 100 mL via INTRAVENOUS

## 2012-10-25 ENCOUNTER — Encounter (HOSPITAL_COMMUNITY): Payer: Self-pay | Admitting: Gastroenterology

## 2012-10-26 ENCOUNTER — Encounter (HOSPITAL_COMMUNITY): Payer: Self-pay | Admitting: *Deleted

## 2012-10-26 ENCOUNTER — Encounter (HOSPITAL_COMMUNITY): Payer: Self-pay | Admitting: Anesthesiology

## 2012-10-26 ENCOUNTER — Ambulatory Visit (HOSPITAL_COMMUNITY)
Admission: RE | Admit: 2012-10-26 | Discharge: 2012-10-26 | Disposition: A | Discharge status: Home / Self Care | Payer: BC Managed Care – PPO | Source: Ambulatory Visit | Attending: General Surgery | Admitting: General Surgery

## 2012-10-26 ENCOUNTER — Ambulatory Visit (HOSPITAL_COMMUNITY): Payer: BC Managed Care – PPO | Admitting: Anesthesiology

## 2012-10-26 ENCOUNTER — Encounter (HOSPITAL_COMMUNITY)
Admission: RE | Disposition: A | Discharge status: Home / Self Care | Payer: Self-pay | Source: Ambulatory Visit | Attending: General Surgery

## 2012-10-26 DIAGNOSIS — L723 Sebaceous cyst: Secondary | ICD-10-CM | POA: Insufficient documentation

## 2012-10-26 DIAGNOSIS — Z01812 Encounter for preprocedural laboratory examination: Secondary | ICD-10-CM | POA: Insufficient documentation

## 2012-10-26 DIAGNOSIS — I10 Essential (primary) hypertension: Secondary | ICD-10-CM | POA: Insufficient documentation

## 2012-10-26 DIAGNOSIS — E119 Type 2 diabetes mellitus without complications: Secondary | ICD-10-CM | POA: Insufficient documentation

## 2012-10-26 DIAGNOSIS — Z01810 Encounter for preprocedural cardiovascular examination: Secondary | ICD-10-CM | POA: Insufficient documentation

## 2012-10-26 HISTORY — PX: MASS EXCISION: SHX2000

## 2012-10-26 LAB — GLUCOSE, CAPILLARY: Glucose-Capillary: 150 mg/dL — ABNORMAL HIGH (ref 70–99)

## 2012-10-26 SURGERY — EXCISION MASS
Anesthesia: General | Site: Scalp | Laterality: Right | Wound class: Clean

## 2012-10-26 MED ORDER — BUPIVACAINE HCL 0.5 % IJ SOLN
INTRAMUSCULAR | Status: DC | PRN
  Administered 2012-10-26: 1 mL

## 2012-10-26 MED ORDER — CEFAZOLIN SODIUM-DEXTROSE 2-3 GM-% IV SOLR: 2.0000 g | INTRAVENOUS | Status: DC

## 2012-10-26 MED ORDER — LACTATED RINGERS IV SOLN
INTRAVENOUS | Status: DC
  Administered 2012-10-26: 1000 mL via INTRAVENOUS

## 2012-10-26 MED ORDER — BUPIVACAINE HCL (PF) 0.5 % IJ SOLN
INTRAMUSCULAR | Status: AC
  Filled 2012-10-26: qty 30

## 2012-10-26 MED ORDER — KETOROLAC TROMETHAMINE 30 MG/ML IJ SOLN
INTRAMUSCULAR | Status: AC
  Filled 2012-10-26: qty 1

## 2012-10-26 MED ORDER — SODIUM CHLORIDE 0.9 % IR SOLN
Status: DC | PRN
  Administered 2012-10-26: 1000 mL

## 2012-10-26 MED ORDER — POVIDONE-IODINE 10 % EX OINT
TOPICAL_OINTMENT | CUTANEOUS | Status: DC | PRN
  Administered 2012-10-26: 1 via TOPICAL

## 2012-10-26 MED ORDER — LACTATED RINGERS IV SOLN
INTRAVENOUS | Status: DC | PRN
  Administered 2012-10-26: 08:00:00 via INTRAVENOUS

## 2012-10-26 MED ORDER — MIDAZOLAM HCL 2 MG/2ML IJ SOLN
INTRAMUSCULAR | Status: AC
  Filled 2012-10-26: qty 2

## 2012-10-26 MED ORDER — FENTANYL CITRATE 0.05 MG/ML IJ SOLN
INTRAMUSCULAR | Status: DC | PRN
  Administered 2012-10-26 (×2): 50 ug via INTRAVENOUS

## 2012-10-26 MED ORDER — MIDAZOLAM HCL 2 MG/2ML IJ SOLN
1.0000 mg | INTRAMUSCULAR | Status: DC | PRN
  Administered 2012-10-26: 2 mg via INTRAVENOUS

## 2012-10-26 MED ORDER — CHLORHEXIDINE GLUCONATE 4 % EX LIQD: 1.0000 "application " | Freq: Once | CUTANEOUS | Status: DC

## 2012-10-26 MED ORDER — FENTANYL CITRATE 0.05 MG/ML IJ SOLN: 25.0000 ug | INTRAMUSCULAR | Status: DC | PRN

## 2012-10-26 MED ORDER — CEFAZOLIN SODIUM-DEXTROSE 2-3 GM-% IV SOLR
INTRAVENOUS | Status: DC | PRN
  Administered 2012-10-26: 2 g via INTRAVENOUS

## 2012-10-26 MED ORDER — PROPOFOL 10 MG/ML IV EMUL
INTRAVENOUS | Status: AC
  Filled 2012-10-26: qty 20

## 2012-10-26 MED ORDER — LIDOCAINE HCL (PF) 1 % IJ SOLN
INTRAMUSCULAR | Status: AC
  Filled 2012-10-26: qty 5

## 2012-10-26 MED ORDER — HYDROCODONE-ACETAMINOPHEN 5-325 MG PO TABS: 1.0000 | ORAL_TABLET | Freq: Four times a day (QID) | ORAL | Status: DC | PRN

## 2012-10-26 MED ORDER — PROPOFOL 10 MG/ML IV BOLUS
INTRAVENOUS | Status: DC | PRN
  Administered 2012-10-26: 160 mg via INTRAVENOUS

## 2012-10-26 MED ORDER — KETOROLAC TROMETHAMINE 30 MG/ML IJ SOLN
30.0000 mg | Freq: Once | INTRAMUSCULAR | Status: AC
  Administered 2012-10-26: 30 mg via INTRAVENOUS

## 2012-10-26 MED ORDER — POVIDONE-IODINE 10 % EX OINT
TOPICAL_OINTMENT | CUTANEOUS | Status: AC
  Filled 2012-10-26: qty 2

## 2012-10-26 MED ORDER — ONDANSETRON HCL 4 MG/2ML IJ SOLN: 4.0000 mg | Freq: Once | INTRAMUSCULAR | Status: DC | PRN

## 2012-10-26 MED ORDER — CEFAZOLIN SODIUM-DEXTROSE 2-3 GM-% IV SOLR
INTRAVENOUS | Status: AC
  Filled 2012-10-26: qty 50

## 2012-10-26 MED ORDER — FENTANYL CITRATE 0.05 MG/ML IJ SOLN
INTRAMUSCULAR | Status: AC
  Filled 2012-10-26: qty 2

## 2012-10-26 MED ORDER — LIDOCAINE HCL (CARDIAC) 10 MG/ML IV SOLN
INTRAVENOUS | Status: DC | PRN
  Administered 2012-10-26: 50 mg via INTRAVENOUS

## 2012-10-26 SURGICAL SUPPLY — 35 items
ADH SKN CLS APL DERMABOND .7 (GAUZE/BANDAGES/DRESSINGS) ×1
BAG HAMPER (MISCELLANEOUS) ×2 IMPLANT
CLOTH BEACON ORANGE TIMEOUT ST (SAFETY) ×2 IMPLANT
COVER LIGHT HANDLE STERIS (MISCELLANEOUS) ×4 IMPLANT
DECANTER SPIKE VIAL GLASS SM (MISCELLANEOUS) ×2 IMPLANT
DERMABOND ADVANCED (GAUZE/BANDAGES/DRESSINGS) ×1
DERMABOND ADVANCED .7 DNX12 (GAUZE/BANDAGES/DRESSINGS) IMPLANT
DURAPREP 26ML APPLICATOR (WOUND CARE) ×2 IMPLANT
ELECT NDL TIP 2.8 STRL (NEEDLE) IMPLANT
ELECT NEEDLE TIP 2.8 STRL (NEEDLE) ×2 IMPLANT
ELECT REM PT RETURN 9FT ADLT (ELECTROSURGICAL) ×2
ELECTRODE REM PT RTRN 9FT ADLT (ELECTROSURGICAL) ×1 IMPLANT
FORMALIN 10 PREFIL 120ML (MISCELLANEOUS) ×2 IMPLANT
GLOVE BIO SURGEON STRL SZ7.5 (GLOVE) ×2 IMPLANT
GLOVE BIOGEL PI IND STRL 7.0 (GLOVE) IMPLANT
GLOVE BIOGEL PI INDICATOR 7.0 (GLOVE) ×2
GLOVE ECLIPSE 6.5 STRL STRAW (GLOVE) ×1 IMPLANT
GOWN STRL REIN XL XLG (GOWN DISPOSABLE) ×5 IMPLANT
KIT ROOM TURNOVER APOR (KITS) ×2 IMPLANT
MANIFOLD NEPTUNE II (INSTRUMENTS) ×2 IMPLANT
NDL HYPO 25X1 1.5 SAFETY (NEEDLE) ×1 IMPLANT
NEEDLE HYPO 25X1 1.5 SAFETY (NEEDLE) ×2 IMPLANT
NS IRRIG 1000ML POUR BTL (IV SOLUTION) ×2 IMPLANT
PACK MINOR (CUSTOM PROCEDURE TRAY) IMPLANT
PAD ARMBOARD 7.5X6 YLW CONV (MISCELLANEOUS) ×2 IMPLANT
SET BASIN LINEN APH (SET/KITS/TRAYS/PACK) ×2 IMPLANT
SPONGE GAUZE 2X2 8PLY STRL LF (GAUZE/BANDAGES/DRESSINGS) ×1 IMPLANT
SUT ETHILON 3 0 FSL (SUTURE) IMPLANT
SUT ETHILON 4 0 PS 2 18 (SUTURE) ×2 IMPLANT
SUT PROLENE 4 0 PS 2 18 (SUTURE) IMPLANT
SUT VIC AB 3-0 SH 27 (SUTURE)
SUT VIC AB 3-0 SH 27X BRD (SUTURE) IMPLANT
SUT VIC AB 4-0 PS2 27 (SUTURE) IMPLANT
SYR CONTROL 10ML LL (SYRINGE) ×2 IMPLANT
TAPE CLOTH SURG 4X10 WHT LF (GAUZE/BANDAGES/DRESSINGS) ×1 IMPLANT

## 2012-10-26 NOTE — Anesthesia Procedure Notes (Signed)
Procedure Name: LMA Insertion Date/Time: 10/26/2012 8:31 AM Performed by: Carolyne Littles, Ninfa Giannelli L Pre-anesthesia Checklist: Patient identified, Patient being monitored, Emergency Drugs available, Timeout performed and Suction available Patient Re-evaluated:Patient Re-evaluated prior to inductionOxygen Delivery Method: Circle system utilized Preoxygenation: Pre-oxygenation with 100% oxygen Intubation Type: IV induction Ventilation: Mask ventilation without difficulty LMA: LMA inserted LMA Size: 4.0 Number of attempts: 1 Placement Confirmation: positive ETCO2 and breath sounds checked- equal and bilateral Tube secured with: Tape Dental Injury: Teeth and Oropharynx as per pre-operative assessment

## 2012-10-26 NOTE — Anesthesia Postprocedure Evaluation (Signed)
Transfer of care  Patient: Chase Briggs  Procedure(s) Performed: Procedure(s): EXCISION MASS (Right)  Patient Location: PACU  Anesthesia Type: General  Level of Consciousness: awake, alert , oriented and patient cooperative  Airway and Oxygen Therapy: Patient Spontanous Breathing and Patient connected to face mask oxygen  Post-op Pain: mild  Post-op Assessment: Post-op Vital signs reviewed, Patient's Cardiovascular Status Stable, Respiratory Function Stable, Patent Airway and No signs of Nausea or vomiting  Post-op Vital Signs: Reviewed and stable  Complications: No apparent anesthesia complications

## 2012-10-26 NOTE — Transfer of Care (Signed)
  Anesthesia Post-op Note  Patient: Chase Briggs  Procedure(s) Performed: Procedure(s): EXCISION MASS (Right)  Patient Location: PACU  Anesthesia Type: General  Level of Consciousness: awake, alert , oriented and patient cooperative  Airway and Oxygen Therapy: Patient Spontanous Breathing and Patient connected to face mask oxygen  Post-op Pain: mild  Post-op Assessment: Post-op Vital signs reviewed, Patient's Cardiovascular Status Stable, Respiratory Function Stable, Patent Airway and No signs of Nausea or vomiting  Post-op Vital Signs: Reviewed and stable  Complications: No apparent anesthesia complications

## 2012-10-26 NOTE — Anesthesia Preprocedure Evaluation (Signed)
Anesthesia Evaluation  Patient identified by MRN, date of birth, ID band Patient awake    Reviewed: Allergy & Precautions, H&P , NPO status , Patient's Chart, lab work & pertinent test results  Airway Mallampati: II TM Distance: >3 FB     Dental  (+) Edentulous Upper and Edentulous Lower   Pulmonary Current Smoker,  breath sounds clear to auscultation        Cardiovascular hypertension, Pt. on medications Rhythm:Regular Rate:Normal     Neuro/Psych    GI/Hepatic GERD-  Medicated,  Endo/Other  diabetes, Type 2, Oral Hypoglycemic Agents  Renal/GU      Musculoskeletal   Abdominal   Peds  Hematology   Anesthesia Other Findings   Reproductive/Obstetrics                           Anesthesia Physical Anesthesia Plan  ASA: III  Anesthesia Plan: General   Post-op Pain Management:    Induction: Intravenous  Airway Management Planned: LMA  Additional Equipment:   Intra-op Plan:   Post-operative Plan: Extubation in OR  Informed Consent: I have reviewed the patients History and Physical, chart, labs and discussed the procedure including the risks, benefits and alternatives for the proposed anesthesia with the patient or authorized representative who has indicated his/her understanding and acceptance.     Plan Discussed with:   Anesthesia Plan Comments:         Anesthesia Quick Evaluation

## 2012-10-26 NOTE — Interval H&P Note (Signed)
History and Physical Interval Note:  10/26/2012 8:14 AM  Chase Briggs  has presented today for surgery, with the diagnosis of Sebaceous Cyst of scalp  The various methods of treatment have been discussed with the patient and family. After consideration of risks, benefits and other options for treatment, the patient has consented to  Procedure(s): EXCISION MASS (Right) as a surgical intervention .  The patient's history has been reviewed, patient examined, no change in status, stable for surgery.  I have reviewed the patient's chart and labs.  Questions were answered to the patient's satisfaction.     Franky Macho A

## 2012-10-26 NOTE — Preoperative (Signed)
Beta Blockers   Reason not to administer Beta Blockers:Not Applicable 

## 2012-10-26 NOTE — Op Note (Signed)
Patient:  Chase Briggs  DOB:  1951-08-13  MRN:  [id number]   Preop Diagnosis:  Mass, scalp  Postop Diagnosis:  Sebaceous cyst, scalp  Procedure:  Excision of benign mass, scalp  Surgeon:  Franky Macho, M.D.  Anes:  General  Indications:  Patient is a 62 year old white male who has a cystic mass in the left posterior regular region of the scalp. The patient now comes to the operating room for excision. The risks and benefits of the procedure were fully explained to the patient, who gave informed consent.  Procedure note:  The patient was placed the supine position. After induction of general anesthesia, the left posterior regular scalp region was prepped and draped using usual sterile technique with DuraPrep. Surgical site confirmation was performed.  An elliptical incision was made around the base of the cystic mass which measured approximately 1.5 cm. A sebaceous cyst was found. It was excised in full without difficulty. He was sent to pathology for further examination. Any bleeding was controlled using Bovie electrocautery. 0.5% Sensorcaine was instilled the surrounding wound. The incision was closed with 4-0 nylon interrupted sutures. Betadine ointment dry sterile dressing were applied.  All tape and needle counts were correct at the end of the procedure. The patient was awakened and transferred to PACU in stable condition.  Complications:  None  EBL:  Minimal  Specimen:  Sebaceous cyst

## 2012-10-28 ENCOUNTER — Encounter (HOSPITAL_COMMUNITY): Payer: Self-pay | Admitting: General Surgery

## 2012-10-31 ENCOUNTER — Telehealth: Payer: Self-pay | Admitting: Gastroenterology

## 2012-10-31 NOTE — Telephone Encounter (Signed)
PLEASE CALL PT. His stomach Bx shows gastritis. Continue OMEPRAZOLE. HIS CT did not show any other reason for his abdominal pain. Follow up in June 2014.

## 2012-11-01 NOTE — Telephone Encounter (Signed)
Results faxed to PCP, recall made for June 2014

## 2012-11-01 NOTE — Telephone Encounter (Signed)
LMOM to call.

## 2012-11-02 NOTE — Telephone Encounter (Signed)
Pt returned call and was informed of results. Is to follow up in June 2014.

## 2012-11-22 NOTE — Progress Notes (Signed)
EGD JAN 2014 Gastitis  CT A/P FEB 2014 NAIAP  REVIEWED.

## 2013-01-18 ENCOUNTER — Ambulatory Visit: Payer: BC Managed Care – PPO | Admitting: Gastroenterology

## 2013-01-18 ENCOUNTER — Telehealth: Payer: Self-pay | Admitting: Gastroenterology

## 2013-01-18 NOTE — Telephone Encounter (Signed)
Pt was a no show

## 2013-01-18 NOTE — Telephone Encounter (Signed)
RECALL FOR JUN 2014

## 2013-01-19 ENCOUNTER — Encounter: Payer: Self-pay | Admitting: Gastroenterology

## 2013-01-19 NOTE — Telephone Encounter (Signed)
Pt is aware of OV on 6/4 at 11 with SF and appt card was mailed

## 2013-02-13 ENCOUNTER — Encounter: Payer: Self-pay | Admitting: Gastroenterology

## 2013-02-15 ENCOUNTER — Ambulatory Visit: Payer: BC Managed Care – PPO | Admitting: Gastroenterology

## 2013-02-15 ENCOUNTER — Telehealth: Payer: Self-pay | Admitting: Gastroenterology

## 2013-02-15 NOTE — Telephone Encounter (Signed)
REVIEWED.  

## 2013-02-15 NOTE — Telephone Encounter (Signed)
Pt was a no show

## 2014-01-31 ENCOUNTER — Ambulatory Visit (HOSPITAL_COMMUNITY)
Admission: RE | Admit: 2014-01-31 | Discharge: 2014-01-31 | Disposition: A | Discharge status: Home / Self Care | Source: Ambulatory Visit | Attending: Family Medicine | Admitting: Family Medicine

## 2014-01-31 ENCOUNTER — Other Ambulatory Visit (HOSPITAL_COMMUNITY): Payer: Self-pay | Admitting: Family Medicine

## 2014-01-31 DIAGNOSIS — R634 Abnormal weight loss: Secondary | ICD-10-CM | POA: Insufficient documentation

## 2014-01-31 DIAGNOSIS — F17200 Nicotine dependence, unspecified, uncomplicated: Secondary | ICD-10-CM | POA: Insufficient documentation

## 2014-02-14 ENCOUNTER — Encounter (HOSPITAL_COMMUNITY): Payer: Self-pay | Admitting: Dietician

## 2014-02-14 NOTE — Progress Notes (Signed)
Outpatient Initial Nutrition Assessment  Date:02/14/2014   Appt Start Time: 1334  Referring Physician: [facility] Reason for Visit: diabetes, unintentional weight loss  Nutrition Assessment:  Height: 5\' 7"  (170.2 cm)   Weight: 131 lb (59.421 kg)   IBW: 148#  %IBW: 89% UBW: 145#  %UBW: 90% Body mass index is 20.51 kg/(m^2).  Goal Weight: Maintenance Weight hx: Wt Readings from Last 10 Encounters:  10/21/12 137 lb (62.143 kg)  10/21/12 137 lb (62.143 kg)  10/20/12 137 lb (62.143 kg)  09/21/12 137 lb 9.6 oz (62.415 kg)  05/20/11 144 lb 3.2 oz (65.409 kg)  01/07/11 145 lb 6.4 oz (65.953 kg)    Estimated nutritional needs:  Kcals/ day: 4656-8127 Protein (grams)/day: 60-71 Fluid (L)/ day: 1.5-1.7  PMH:  Past Medical History  Diagnosis Date  . HTN (hypertension)   . Diabetes mellitus   . Hyperlipidemia   . Fatty liver   . GERD (gastroesophageal reflux disease)     Medications:  Current Outpatient Rx  Name  Route  Sig  Dispense  Refill  . amLODipine-valsartan (EXFORGE) 5-320 MG per tablet   Oral   Take 1 tablet by mouth daily.           Marland Kitchen HYDROcodone-acetaminophen (NORCO) 5-325 MG per tablet   Oral   Take 1-2 tablets by mouth every 6 (six) hours as needed for pain.   40 tablet   0   . ibuprofen (ADVIL,MOTRIN) 200 MG tablet   Oral   Take 400 mg by mouth every 6 (six) hours as needed. For pain         . omeprazole (PRILOSEC) 20 MG capsule   Oral   Take 1 capsule (20 mg total) by mouth daily.   31 capsule   2   . simvastatin (ZOCOR) 20 MG tablet   Oral   Take 20 mg by mouth every other day.          . sitaGLIPtan-metformin (JANUMET) 50-500 MG per tablet   Oral   Take 1 tablet by mouth 2 (two) times daily with a meal.           Labs: CMP     Component Value Date/Time   NA 137 10/20/2012 1525   K 4.7 10/20/2012 1525   CL 101 10/20/2012 1525   CO2 26 10/20/2012 1525   GLUCOSE 192* 10/20/2012 1525   BUN 11 10/20/2012 1525   CREATININE 1.14 10/20/2012 1525   CALCIUM  10.2 10/20/2012 1525   PROT 7.5 09/21/2012 1114   ALBUMIN 4.7 09/21/2012 1114   AST 13 09/21/2012 1114   ALT 12 09/21/2012 1114   ALKPHOS 79 09/21/2012 1114   BILITOT 0.5 09/21/2012 1114   GFRNONAA 68* 10/20/2012 1525   GFRAA 78* 10/20/2012 1525    Lipid Panel  No results found for this basename: chol, trig, hdl, cholhdl, vldl, ldlcalc     No results found for this basename: HGBA1C   Lab Results  Component Value Date   CREATININE 1.14 10/20/2012     Lifestyle/ social habits: Mr. Schmiesing resides in [place] with his wife. Occupation: retired from [employer] in January. Physical activity: active. He does not participate in a formal exercise program, but he reports he is very active. He mows his 1 acre yard and maintains a garden on his property. He reports "I'm always doing something".    Nutrition hx/habits: Mr. Chesbro complains of decreased appetite and weight loss. He reports UBW of 145#, which he last weighed 1 year ago.  Appetite has been poor for about the same time. Noted a 14# (9.7%) wt loss x 1 year, which is not clinically significant.  He describes himself of being "too choicey" of an eater. He reveals a limited diet since childhood, as his parents would not make him eat foods that he didn't like. He does not like to eat many vegetables, although he has access to a home garden. Upon further questioning, he reports he will eat vegetables are long as they are cooked soft. He denies any chewing or swallowing difficulties.  He also reveals that he recently retired from [employer] after working there since 1973. He reports that eating on a schedule was easier for him then, as he would eat during his breaks. He admits that now he is home alone, he will "forget" to eat because he will want to complete his household chores without taking a break. Snacks will include things like apples or grapes. However, meals are mainly starch and protein. He drinks mostly water and Gatorade.  He reveals that he takes his  health very seriously and his very concerned about his weight loss. He reports that cancer runs in his family and hopes that he does not have it. He attributes some of his weight loss to his disordered eating pattern.  CBGs typically range from 160-180.  He self tests twice a day.  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   Nutrition Intervention: Nutrition rx: 1500-1700 Kcal NAS, diabetic diet; 3 meals per day; 2-3 snacks; low calorie beverages only; Physical activity 30-60 minutes daily  Education/Counseling Provided: Reviewed diet recall. Discussed components of a general healthy diet, including: lean proteins, fruits, vegetables, whole grains, and low fat dairy. Discussed eating small, frequent meals due to early satiety. Discussed examples of high calorie, high protein snacks. Gave specific examples of ways pt could include more fruits and vegetables in his diet. Also reviewed low sodium and diabetic diet guidelines. Educated pt on food label reading. Provided AND Nutrition Care Manual's "General, Healthy Diet", "Low Sodium Nutrition Therapy", and "Tups for Increasing Calories and Protein" handouts. Also provided "Destination: Heart Healthy Eating" handout upon request. Teachback method used.   Understanding, Motivation, Ability to Follow Recommendations: Expect fair compliance.   Monitoring and Evaluation: Goals: 1) 0.5-2# wt loss per week; 2) Continue current physical activity; 3) 4-6 small meals per day  Recommendations: 1) Continue to drink water; 2) High Protein snacks, such as apple with peanut butter or yogurt; 3) Vegetables with every meal; 4) Continue to reduce salt intake  F/U: PRN. Provided RD contact information.   Yader Criger A. Mayford KnifeWilliams, RD, LDN 02/14/2014   Appt EndTime: [id number]

## 2014-02-19 ENCOUNTER — Encounter (HOSPITAL_COMMUNITY): Payer: Self-pay | Admitting: Emergency Medicine

## 2014-02-19 ENCOUNTER — Emergency Department (HOSPITAL_COMMUNITY)

## 2014-02-19 ENCOUNTER — Inpatient Hospital Stay (HOSPITAL_COMMUNITY)
Admission: EM | Admit: 2014-02-19 | Discharge: 2014-02-21 | DRG: 637 | Disposition: A | Discharge status: Home / Self Care | Attending: Internal Medicine | Admitting: Internal Medicine

## 2014-02-19 DIAGNOSIS — E1110 Type 2 diabetes mellitus with ketoacidosis without coma: Secondary | ICD-10-CM | POA: Diagnosis present

## 2014-02-19 DIAGNOSIS — R634 Abnormal weight loss: Secondary | ICD-10-CM

## 2014-02-19 DIAGNOSIS — E1310 Other specified diabetes mellitus with ketoacidosis without coma: Principal | ICD-10-CM | POA: Diagnosis present

## 2014-02-19 DIAGNOSIS — Z794 Long term (current) use of insulin: Secondary | ICD-10-CM

## 2014-02-19 DIAGNOSIS — E1165 Type 2 diabetes mellitus with hyperglycemia: Secondary | ICD-10-CM

## 2014-02-19 DIAGNOSIS — E441 Mild protein-calorie malnutrition: Secondary | ICD-10-CM | POA: Diagnosis present

## 2014-02-19 DIAGNOSIS — F17200 Nicotine dependence, unspecified, uncomplicated: Secondary | ICD-10-CM | POA: Diagnosis present

## 2014-02-19 DIAGNOSIS — E119 Type 2 diabetes mellitus without complications: Secondary | ICD-10-CM

## 2014-02-19 DIAGNOSIS — K7689 Other specified diseases of liver: Secondary | ICD-10-CM | POA: Diagnosis present

## 2014-02-19 DIAGNOSIS — K219 Gastro-esophageal reflux disease without esophagitis: Secondary | ICD-10-CM | POA: Diagnosis present

## 2014-02-19 DIAGNOSIS — E785 Hyperlipidemia, unspecified: Secondary | ICD-10-CM | POA: Diagnosis present

## 2014-02-19 DIAGNOSIS — R5383 Other fatigue: Secondary | ICD-10-CM

## 2014-02-19 DIAGNOSIS — E43 Unspecified severe protein-calorie malnutrition: Secondary | ICD-10-CM | POA: Diagnosis present

## 2014-02-19 DIAGNOSIS — E1122 Type 2 diabetes mellitus with diabetic chronic kidney disease: Secondary | ICD-10-CM | POA: Diagnosis present

## 2014-02-19 DIAGNOSIS — N183 Chronic kidney disease, stage 3 (moderate): Secondary | ICD-10-CM

## 2014-02-19 DIAGNOSIS — I10 Essential (primary) hypertension: Secondary | ICD-10-CM | POA: Diagnosis present

## 2014-02-19 DIAGNOSIS — E1169 Type 2 diabetes mellitus with other specified complication: Secondary | ICD-10-CM | POA: Diagnosis present

## 2014-02-19 DIAGNOSIS — Z79899 Other long term (current) drug therapy: Secondary | ICD-10-CM

## 2014-02-19 LAB — BASIC METABOLIC PANEL
BUN: 18 mg/dL (ref 6–23)
CHLORIDE: 101 meq/L (ref 96–112)
CO2: 17 mEq/L — ABNORMAL LOW (ref 19–32)
Calcium: 9.2 mg/dL (ref 8.4–10.5)
Creatinine, Ser: 1.19 mg/dL (ref 0.50–1.35)
GFR calc Af Amer: 74 mL/min — ABNORMAL LOW (ref >90)
GFR, EST NON AFRICAN AMERICAN: 64 mL/min — AB (ref >90)
GLUCOSE: 344 mg/dL — AB (ref 70–99)
POTASSIUM: 4.3 meq/L (ref 3.7–5.3)
SODIUM: 135 meq/L — AB (ref 137–147)

## 2014-02-19 LAB — URINALYSIS, ROUTINE W REFLEX MICROSCOPIC
BILIRUBIN URINE: NEGATIVE
Glucose, UA: >1000 mg/dL — AB
HGB URINE DIPSTICK: NEGATIVE
KETONES UR: 15 mg/dL — AB
Leukocytes, UA: NEGATIVE
NITRITE: NEGATIVE
Protein, ur: 30 mg/dL — AB
SPECIFIC GRAVITY, URINE: 1.039 — AB (ref 1.005–1.030)
UROBILINOGEN UA: 0.2 mg/dL (ref 0.0–1.0)
pH: 5.5 (ref 5.0–8.0)

## 2014-02-19 LAB — URINE MICROSCOPIC-ADD ON

## 2014-02-19 LAB — CBC
HCT: 42.2 % (ref 39.0–52.0)
HCT: 40.8 % (ref 39.0–52.0)
HEMOGLOBIN: 15.2 g/dL (ref 13.0–17.0)
HEMOGLOBIN: 14.7 g/dL (ref 13.0–17.0)
MCH: 32.2 pg (ref 26.0–34.0)
MCH: 31.9 pg (ref 26.0–34.0)
MCHC: 36 g/dL (ref 30.0–36.0)
MCHC: 36 g/dL (ref 30.0–36.0)
MCV: 89.4 fL (ref 78.0–100.0)
MCV: 88.5 fL (ref 78.0–100.0)
PLATELETS: 176 10*3/uL (ref 150–400)
Platelets: 179 10*3/uL (ref 150–400)
RBC: 4.72 MIL/uL (ref 4.22–5.81)
RBC: 4.61 MIL/uL (ref 4.22–5.81)
RDW: 12.7 % (ref 11.5–15.5)
RDW: 12.6 % (ref 11.5–15.5)
WBC: 10 10*3/uL (ref 4.0–10.5)
WBC: 9.3 10*3/uL (ref 4.0–10.5)

## 2014-02-19 LAB — I-STAT VENOUS BLOOD GAS, ED
Acid-base deficit: 6 mmol/L — ABNORMAL HIGH (ref 0.0–2.0)
BICARBONATE: 18.5 meq/L — AB (ref 20.0–24.0)
O2 Saturation: 58 %
PCO2 VEN: 34.3 mmHg — AB (ref 45.0–50.0)
PH VEN: 7.339 — AB (ref 7.250–7.300)
TCO2: 20 mmol/L (ref 0–100)
pO2, Ven: 32 mmHg (ref 30.0–45.0)

## 2014-02-19 LAB — GLUCOSE, CAPILLARY
Glucose-Capillary: 246 mg/dL — ABNORMAL HIGH (ref 70–99)
Glucose-Capillary: 137 mg/dL — ABNORMAL HIGH (ref 70–99)

## 2014-02-19 LAB — COMPREHENSIVE METABOLIC PANEL
ALK PHOS: 135 U/L — AB (ref 39–117)
ALT: 14 U/L (ref 0–53)
AST: 17 U/L (ref 0–37)
Albumin: 4.1 g/dL (ref 3.5–5.2)
BUN: 22 mg/dL (ref 6–23)
CHLORIDE: 100 meq/L (ref 96–112)
CO2: 16 meq/L — AB (ref 19–32)
Calcium: 9.9 mg/dL (ref 8.4–10.5)
Creatinine, Ser: 1.4 mg/dL — ABNORMAL HIGH (ref 0.50–1.35)
GFR, EST AFRICAN AMERICAN: 61 mL/min — AB (ref >90)
GFR, EST NON AFRICAN AMERICAN: 52 mL/min — AB (ref >90)
GLUCOSE: 431 mg/dL — AB (ref 70–99)
POTASSIUM: 4.8 meq/L (ref 3.7–5.3)
SODIUM: 133 meq/L — AB (ref 137–147)
Total Bilirubin: 0.4 mg/dL (ref 0.3–1.2)
Total Protein: 8 g/dL (ref 6.0–8.3)

## 2014-02-19 LAB — CBG MONITORING, ED
GLUCOSE-CAPILLARY: 433 mg/dL — AB (ref 70–99)
Glucose-Capillary: 294 mg/dL — ABNORMAL HIGH (ref 70–99)
Glucose-Capillary: 362 mg/dL — ABNORMAL HIGH (ref 70–99)
Glucose-Capillary: 368 mg/dL — ABNORMAL HIGH (ref 70–99)

## 2014-02-19 MED ORDER — PANTOPRAZOLE SODIUM 40 MG PO TBEC
40.0000 mg | DELAYED_RELEASE_TABLET | Freq: Every day | ORAL | Status: DC
  Administered 2014-02-19 – 2014-02-21 (×3): 40 mg via ORAL
  Filled 2014-02-19 (×3): qty 1

## 2014-02-19 MED ORDER — AMLODIPINE BESYLATE 5 MG PO TABS
5.0000 mg | ORAL_TABLET | Freq: Every day | ORAL | Status: DC
Start: 2014-02-20 — End: 2014-02-21
  Administered 2014-02-20 – 2014-02-21 (×2): 5 mg via ORAL
  Filled 2014-02-19 (×2): qty 1

## 2014-02-19 MED ORDER — IRBESARTAN 300 MG PO TABS
300.0000 mg | ORAL_TABLET | Freq: Every day | ORAL | Status: DC
  Administered 2014-02-20 – 2014-02-21 (×2): 300 mg via ORAL
  Filled 2014-02-19 (×2): qty 1

## 2014-02-19 MED ORDER — AMLODIPINE BESYLATE-VALSARTAN 5-320 MG PO TABS: 1.0000 | ORAL_TABLET | Freq: Every day | ORAL | Status: DC

## 2014-02-19 MED ORDER — SODIUM CHLORIDE 0.9 % IV SOLN
INTRAVENOUS | Status: AC
  Administered 2014-02-19: 2.3 [IU]/h via INTRAVENOUS
  Administered 2014-02-19: 6 [IU]/h via INTRAVENOUS
  Administered 2014-02-19: 0.8 [IU]/h via INTRAVENOUS
  Administered 2014-02-19: 3.7 [IU]/h via INTRAVENOUS
  Filled 2014-02-19 (×3): qty 1

## 2014-02-19 MED ORDER — DIAZEPAM 5 MG PO TABS: 5.0000 mg | ORAL_TABLET | ORAL | Status: DC | PRN

## 2014-02-19 MED ORDER — DEXTROSE-NACL 5-0.45 % IV SOLN
INTRAVENOUS | Status: AC
  Administered 2014-02-19: via INTRAVENOUS

## 2014-02-19 MED ORDER — POTASSIUM CHLORIDE 10 MEQ/100ML IV SOLN
10.0000 meq | INTRAVENOUS | Status: AC
  Administered 2014-02-19: 10 meq via INTRAVENOUS
  Filled 2014-02-19 (×2): qty 100

## 2014-02-19 MED ORDER — CLONIDINE HCL 0.2 MG PO TABS
0.2000 mg | ORAL_TABLET | Freq: Three times a day (TID) | ORAL | Status: DC
  Administered 2014-02-19 – 2014-02-21 (×5): 0.2 mg via ORAL
  Filled 2014-02-19 (×7): qty 1

## 2014-02-19 MED ORDER — SODIUM CHLORIDE 0.9 % IV SOLN
INTRAVENOUS | Status: DC
  Administered 2014-02-20: 14:00:00 via INTRAVENOUS

## 2014-02-19 MED ORDER — DEXTROSE 50 % IV SOLN: 25.0000 mL | INTRAVENOUS | Status: DC | PRN

## 2014-02-19 MED ORDER — DEXTROSE-NACL 5-0.45 % IV SOLN: INTRAVENOUS | Status: AC

## 2014-02-19 MED ORDER — SIMVASTATIN 20 MG PO TABS
20.0000 mg | ORAL_TABLET | ORAL | Status: DC
  Administered 2014-02-20: 20 mg via ORAL
  Filled 2014-02-19: qty 1

## 2014-02-19 MED ORDER — SODIUM CHLORIDE 0.9 % IV SOLN: INTRAVENOUS | Status: AC

## 2014-02-19 MED ORDER — HEPARIN SODIUM (PORCINE) 5000 UNIT/ML IJ SOLN
5000.0000 [IU] | Freq: Three times a day (TID) | INTRAMUSCULAR | Status: DC
  Administered 2014-02-19 – 2014-02-20 (×3): 5000 [IU] via SUBCUTANEOUS
  Filled 2014-02-19 (×8): qty 1

## 2014-02-19 MED ORDER — SODIUM CHLORIDE 0.9 % IV BOLUS (SEPSIS)
2000.0000 mL | Freq: Once | INTRAVENOUS | Status: AC
  Administered 2014-02-19: 2000 mL via INTRAVENOUS

## 2014-02-19 NOTE — ED Notes (Signed)
Dr. Gardener at bedside. 

## 2014-02-19 NOTE — ED Notes (Signed)
Hannah M, PA at bedside  

## 2014-02-19 NOTE — H&P (Signed)
[facility] History and Physical  DREGAN PALUBICKI DGL:[id number] DOB: 07-01-1951 DOA: 02/19/2014  Referring physician: EDP PCP: Lenise Herald, PA-C   Chief Complaint: Hyperglycemia   HPI: Chase Briggs is a 63 y.o. male h/o NIDDM, HTN, GERD.  He presents to the ED with gradually worsening hyperglycemia over the past 5 days.  Weight loss of 5 lbs in the past 4 days.  Polyuria and polydipsia in past week.  CBGs in the 500s today so he came in to the ED.  No changes in diet.  Has had significant stress in life over past week.  No other symptoms at this time.  Review of Systems: Systems reviewed.  As above, otherwise negative  Past Medical History  Diagnosis Date  . HTN (hypertension)   . Diabetes mellitus   . Hyperlipidemia   . Fatty liver   . GERD (gastroesophageal reflux disease)    Past Surgical History  Procedure Laterality Date  . Back surgery    . Colonoscopy  01/2007    Dr. [name], reviewed report, mild diverticulosis. Prep adequate. Next TCS due 01/2017  . Scalp mass  4-5 yrs ago    Dr [name]  . Esophagogastroduodenoscopy N/A 10/21/2012    [id number] GASTRITIS  . Mass excision Right 10/26/2012    Procedure: EXCISION MASS;  Surgeon: [name], MD;  Location: [facility];  Service: General;  Laterality: Right;   Social History:  reports that he has been smoking Cigarettes.  He has a 12.5 pack-year smoking history. He does not have any smokeless tobacco history on file. He reports that he does not drink alcohol or use illicit drugs.  No Known Allergies  Family History  Problem Relation Age of Onset  . Colon cancer Neg Hx   . Liver disease Neg Hx      Prior to Admission medications   Medication Sig Start Date End Date Taking? Authorizing Provider  amLODipine (NORVASC) 10 MG tablet Take 10 mg by mouth daily.   Yes Historical Provider, MD  amLODipine-valsartan (EXFORGE) 5-320 MG per tablet Take 1 tablet by mouth daily.     Yes Historical Provider, MD   cloNIDine (CATAPRES) 0.2 MG tablet Take 0.2 mg by mouth 3 (three) times daily.   Yes Historical Provider, MD  diazepam (VALIUM) 5 MG tablet Take 5 mg by mouth as needed for anxiety.   Yes Historical Provider, MD  losartan (COZAAR) 100 MG tablet Take 100 mg by mouth daily.   Yes Historical Provider, MD  megestrol (MEGACE) 40 MG/ML suspension Take by mouth daily.   Yes Historical Provider, MD  metFORMIN (GLUCOPHAGE-XR) 500 MG 24 hr tablet Take 1,000 mg by mouth at bedtime.   Yes Historical Provider, MD  omeprazole (PRILOSEC) 20 MG capsule Take 1 capsule (20 mg total) by mouth daily. 09/21/12  Yes Joselyn Arrow, NP  simvastatin (ZOCOR) 20 MG tablet Take 20 mg by mouth every other day.  11/18/10  Yes Historical Provider, MD   Physical Exam: Filed Vitals:   02/19/14 1930  BP: 188/93  Pulse: 75  Temp:   Resp:     BP 188/93  Pulse 75  Temp(Src) 97.9 F (36.6 C) (Oral)  Resp 16  Ht 5' 6.5" (1.689 m)  Wt 58.514 kg (129 lb)  BMI 20.51 kg/m2  SpO2 99%  General Appearance:    Alert, oriented, no distress, appears stated age  Head:    Normocephalic, atraumatic  Eyes:    PERRL, EOMI, sclera non-icteric  Nose:   Nares without drainage or epistaxis. Mucosa, turbinates normal  Throat:   Moist mucous membranes. Oropharynx without erythema or exudate.  Neck:   Supple. No carotid bruits.  No thyromegaly.  No lymphadenopathy.   Back:     No CVA tenderness, no spinal tenderness  Lungs:     Clear to auscultation bilaterally, without wheezes, rhonchi or rales  Chest wall:    No tenderness to palpitation  Heart:    Regular rate and rhythm without murmurs, gallops, rubs  Abdomen:     Soft, non-tender, nondistended, normal bowel sounds, no organomegaly  Genitalia:    deferred  Rectal:    deferred  Extremities:   No clubbing, cyanosis or edema.  Pulses:   2+ and symmetric all extremities  Skin:   Skin color, texture, turgor normal, no rashes or lesions  Lymph nodes:   Cervical, supraclavicular,  and axillary nodes normal  Neurologic:   CNII-XII intact. Normal strength, sensation and reflexes      throughout    Labs on Admission:  Basic Metabolic Panel:  Recent Labs Lab 02/19/14 1340  NA 133*  K 4.8  CL 100  CO2 16*  GLUCOSE 431*  BUN 22  CREATININE 1.40*  CALCIUM 9.9   Liver Function Tests:  Recent Labs Lab 02/19/14 1340  AST 17  ALT 14  ALKPHOS 135*  BILITOT 0.4  PROT 8.0  ALBUMIN 4.1   No results found for this basename: LIPASE, AMYLASE,  in the last 168 hours No results found for this basename: AMMONIA,  in the last 168 hours CBC:  Recent Labs Lab 02/19/14 1340  WBC 10.0  HGB 15.2  HCT 42.2  MCV 89.4  PLT 176   Cardiac Enzymes: No results found for this basename: CKTOTAL, CKMB, CKMBINDEX, TROPONINI,  in the last 168 hours  BNP (last 3 results) No results found for this basename: PROBNP,  in the last 8760 hours CBG:  Recent Labs Lab 02/19/14 1341 02/19/14 1929  GLUCAP 433* 294*    Radiological Exams on Admission: Dg Chest 2 View  02/19/2014   CLINICAL DATA:  Cough.  Smoker.  EXAM: CHEST  2 VIEW  COMPARISON:  PA and lateral chest 01/31/2014 and 09/30/2012.  FINDINGS: The lungs are clear. Heart size is normal. No pneumothorax or pleural effusion. Thoracic spondylosis noted.  IMPRESSION: No acute abnormality.   Electronically Signed   By: Drusilla Kannerhomas  Dalessio M.D.   On: 02/19/2014 18:55    EKG: Independently reviewed.  Assessment/Plan Principal Problem:   DKA (diabetic ketoacidoses)   1. DKA - patient with mild DKA, on DKA protocol with Q4H labs.  Work up negative for infectious cause.  Suspect increased stress vs conversion to IDDM given his long history of DM (10-15 years since diagnosis).    Code Status: Full  Family Communication: No family in room Disposition Plan: Admit to inpatient   Time spent: 50 min  Hillary BowJared M Nasean Zapf [facility] Pager [phone]  If 7AM-7PM, please contact the day team taking care of the  patient [email or web address] Password [email or web address] 02/19/2014, 8:49 PM

## 2014-02-19 NOTE — ED Notes (Signed)
Pt reports high blood sugars for about 1 week, also 15 lb wt loss over past few weeks. Has been taking metformin. Pt is a x 4. Denies pain. Respirations unlabored.

## 2014-02-19 NOTE — ED Provider Notes (Signed)
Medical screening examination/treatment/procedure(s) were conducted as a shared visit with non-physician practitioner(s) and myself.  I personally evaluated the patient during the encounter.   EKG Interpretation None      Patient seen by me. 63 year old male presented for high blood sugars for about a week. 15 pound weight loss. Patient is on metformin long-standing history of non-insulin-dependent diabetes. Patient here was significant lab abnormalities and acidosis consistent with DKA. Patient started on glucose stabilizer and will require admission. Critical care time built by the mid-level. Critical care time is appropriate. Admission arranged.     Vanetta Mulders, MD 02/19/14 2124

## 2014-02-19 NOTE — ED Provider Notes (Signed)
CSN: [id number]     Arrival date & time 02/19/14  1300 History   First MD Initiated Contact with Patient 02/19/14 1659     Chief Complaint  Patient presents with  . Hyperglycemia     (Consider location/radiation/quality/duration/timing/severity/associated sxs/prior Treatment) The history is provided by the patient and medical records. No language interpreter was used.    Chase Briggs is a 63 y.o. male  with a hx of NIDDM, HTN, GERD presents to the Emergency Department complaining of gradual, persistent, progressively worsening hyperglycemia onset 5 days ago.  Pt is taking Metformin 100mg  QD at bedtime.  Pt reports his CBG increased last week and was found to have CBGs in the 500s.   BS normally ran in the mid to low 100s.  Pt denies changes in his diet, but has had intermittent anorexia.  Pt reports weight loss of 5 lbs in the last 4 days, but weighed approx 145 in Jan.  Pt also endorses polyuria, polydipsia in the last week, but denies polyphagia. Pt reports he has had a significant increase in stress in his life last week.  No known aggravating or alleviating factors.  Pt denies fever, chills, headache, neck pain, chest pain, SOB, abd pain, N/V/D, weakness, dizziness, syncope.   PCP - [facility]  Past Medical History  Diagnosis Date  . HTN (hypertension)   . Diabetes mellitus   . Hyperlipidemia   . Fatty liver   . GERD (gastroesophageal reflux disease)    Past Surgical History  Procedure Laterality Date  . Back surgery    . Colonoscopy  01/2007    Dr. [name], reviewed report, mild diverticulosis. Prep adequate. Next TCS due 01/2017  . Scalp mass  4-5 yrs ago    Dr [name]  . Esophagogastroduodenoscopy N/A 10/21/2012    [id number] GASTRITIS  . Mass excision Right 10/26/2012    Procedure: EXCISION MASS;  Surgeon: [name], MD;  Location: [facility];  Service: General;  Laterality: Right;   Family History  Problem Relation Age of Onset  . Colon  cancer Neg Hx   . Liver disease Neg Hx    History  Substance Use Topics  . Smoking status: Current Every Day Smoker -- 0.50 packs/day for 25 years    Types: Cigarettes  . Smokeless tobacco: Not on file  . Alcohol Use: No     Comment: hx heavy etoh x 3 yrs, quit 30+ yrs ago    Review of Systems  Constitutional: Positive for fatigue and unexpected weight change (weight loss). Negative for fever, diaphoresis and appetite change.  HENT: Negative for mouth sores.   Eyes: Negative for visual disturbance.  Respiratory: Positive for cough. Negative for chest tightness, shortness of breath and wheezing.   Cardiovascular: Negative for chest pain.  Gastrointestinal: Negative for nausea, vomiting, abdominal pain, diarrhea and constipation.  Endocrine: Positive for polydipsia and polyuria. Negative for polyphagia.  Genitourinary: Negative for dysuria, urgency, frequency and hematuria.  Musculoskeletal: Negative for back pain and neck stiffness.  Skin: Negative for rash.  Allergic/Immunologic: Negative for immunocompromised state.  Neurological: Negative for syncope, light-headedness and headaches.  Hematological: Does not bruise/bleed easily.  Psychiatric/Behavioral: Negative for sleep disturbance. The patient is not nervous/anxious.       Allergies  Review of patient's allergies indicates no known allergies.  Home Medications   Prior to Admission medications   Medication Sig Start Date End Date Taking? Authorizing Provider  amLODipine (NORVASC) 10 MG tablet Take 10 mg by  mouth daily.   Yes Historical Provider, MD  amLODipine-valsartan (EXFORGE) 5-320 MG per tablet Take 1 tablet by mouth daily.     Yes Historical Provider, MD  cloNIDine (CATAPRES) 0.2 MG tablet Take 0.2 mg by mouth 3 (three) times daily.   Yes Historical Provider, MD  diazepam (VALIUM) 5 MG tablet Take 5 mg by mouth as needed for anxiety.   Yes Historical Provider, MD  losartan (COZAAR) 100 MG tablet Take 100 mg by mouth  daily.   Yes Historical Provider, MD  megestrol (MEGACE) 40 MG/ML suspension Take by mouth daily.   Yes Historical Provider, MD  metFORMIN (GLUCOPHAGE-XR) 500 MG 24 hr tablet Take 1,000 mg by mouth at bedtime.   Yes Historical Provider, MD  omeprazole (PRILOSEC) 20 MG capsule Take 1 capsule (20 mg total) by mouth daily. 09/21/12  Yes Joselyn ArrowKandice L Jones, NP  simvastatin (ZOCOR) 20 MG tablet Take 20 mg by mouth every other day.  11/18/10  Yes Historical Provider, MD   BP 188/93  Pulse 75  Temp(Src) 97.9 F (36.6 C) (Oral)  Resp 16  Ht 5' 6.5" (1.689 m)  Wt 129 lb (58.514 kg)  BMI 20.51 kg/m2  SpO2 99% Physical Exam  Nursing note and vitals reviewed. Constitutional: He is oriented to person, place, and time. He appears well-developed and well-nourished. No distress.  Awake, alert, nontoxic appearance Cachectic  HENT:  Head: Normocephalic and atraumatic.  Nose: Nose normal.  Mouth/Throat: Mucous membranes are dry. No oropharyngeal exudate.  Because membranes somewhat dry  Eyes: Conjunctivae are normal. No scleral icterus.  Neck: Normal range of motion. Neck supple.  Cardiovascular: Normal rate, regular rhythm, normal heart sounds and intact distal pulses.   No murmur heard. Pulmonary/Chest: Effort normal. No respiratory distress. He has wheezes in the left upper field. He has no rhonchi. He has no rales.  Mild, expiratory wheeze heard in the left upper field without rales or rhonchi  Abdominal: Soft. Bowel sounds are normal. He exhibits no distension and no mass. There is no tenderness. There is no rebound and no guarding.  Musculoskeletal: Normal range of motion. He exhibits no edema.  Neurological: He is alert and oriented to person, place, and time. Coordination normal.  Speech is clear and goal oriented Moves extremities without ataxia  Skin: Skin is warm and dry. He is not diaphoretic. No erythema.  Psychiatric: He has a normal mood and affect.    ED Course  Procedures (including  critical care time) Labs Review Labs Reviewed  COMPREHENSIVE METABOLIC PANEL - Abnormal; Notable for the following:    Sodium 133 (*)    CO2 16 (*)    Glucose, Bld 431 (*)    Creatinine, Ser 1.40 (*)    Alkaline Phosphatase 135 (*)    GFR calc non Af Amer 52 (*)    GFR calc Af Amer 61 (*)    All other components within normal limits  URINALYSIS, ROUTINE W REFLEX MICROSCOPIC - Abnormal; Notable for the following:    APPearance HAZY (*)    Specific Gravity, Urine 1.039 (*)    Glucose, UA >1000 (*)    Ketones, ur 15 (*)    Protein, ur 30 (*)    All other components within normal limits  URINE MICROSCOPIC-ADD ON - Abnormal; Notable for the following:    Bacteria, UA FEW (*)    Casts GRANULAR CAST (*)    All other components within normal limits  CBG MONITORING, ED - Abnormal; Notable for the following:  Glucose-Capillary 433 (*)    All other components within normal limits  I-STAT VENOUS BLOOD GAS, ED - Abnormal; Notable for the following:    pH, Ven 7.339 (*)    pCO2, Ven 34.3 (*)    Bicarbonate 18.5 (*)    Acid-base deficit 6.0 (*)    All other components within normal limits  CBG MONITORING, ED - Abnormal; Notable for the following:    Glucose-Capillary 294 (*)    All other components within normal limits  CBC  BLOOD GAS, VENOUS    Imaging Review Dg Chest 2 View  02/19/2014   CLINICAL DATA:  Cough.  Smoker.  EXAM: CHEST  2 VIEW  COMPARISON:  PA and lateral chest 01/31/2014 and 09/30/2012.  FINDINGS: The lungs are clear. Heart size is normal. No pneumothorax or pleural effusion. Thoracic spondylosis noted.  IMPRESSION: No acute abnormality.   Electronically Signed   By: Drusilla Kanner M.D.   On: 02/19/2014 18:55     EKG Interpretation None      CRITICAL CARE Performed by: Dahlia Client Antonis Lor Total critical care time: Critical care time was exclusive of separately billable procedures and treating other patients. Critical care was necessary to treat or  prevent imminent or life-threatening deterioration. Critical care was time spent personally by me on the following activities: development of treatment plan with patient and/or surrogate as well as nursing, discussions with consultants, evaluation of patient's response to treatment, examination of patient, obtaining history from patient or surrogate, ordering and performing treatments and interventions, ordering and review of laboratory studies, ordering and review of radiographic studies, pulse oximetry and re-evaluation of patient's condition.   MDM   Final diagnoses:  DKA (diabetic ketoacidoses)  Fatigue  Weight loss   Chase Briggs presents for hyperglycemia.  Patient without readily identifiable source of infection. Chest x-ray pending. Anion gap of 17 however patient CO2 low on CMP. Concern for possible DKA. Ketones noted in urine. We'll begin giving fluids, glucostabilizer.  VBG pending.    7:31 PM VBG with mild acidosis and low bicarbonate.  Pt with DKA.  Glucostabilizer begun.  Will proceed with admission.    BP 181/93  Pulse 74  Temp(Src) 97.9 F (36.6 C) (Oral)  Resp 16  Ht 5' 6.5" (1.689 m)  Wt 129 lb (58.514 kg)  BMI 20.51 kg/m2  SpO2 98%  8:06 PM Pt admitted by [facility].  Dahlia Client Raziya Aveni, PA-C 02/19/14 2006

## 2014-02-20 DIAGNOSIS — E43 Unspecified severe protein-calorie malnutrition: Secondary | ICD-10-CM | POA: Diagnosis present

## 2014-02-20 DIAGNOSIS — E1122 Type 2 diabetes mellitus with diabetic chronic kidney disease: Secondary | ICD-10-CM | POA: Diagnosis present

## 2014-02-20 DIAGNOSIS — E441 Mild protein-calorie malnutrition: Secondary | ICD-10-CM | POA: Diagnosis present

## 2014-02-20 DIAGNOSIS — E1169 Type 2 diabetes mellitus with other specified complication: Secondary | ICD-10-CM | POA: Diagnosis present

## 2014-02-20 DIAGNOSIS — E1165 Type 2 diabetes mellitus with hyperglycemia: Secondary | ICD-10-CM

## 2014-02-20 DIAGNOSIS — I10 Essential (primary) hypertension: Secondary | ICD-10-CM

## 2014-02-20 DIAGNOSIS — N183 Chronic kidney disease, stage 3 (moderate): Secondary | ICD-10-CM

## 2014-02-20 LAB — GLUCOSE, CAPILLARY
GLUCOSE-CAPILLARY: 138 mg/dL — AB (ref 70–99)
GLUCOSE-CAPILLARY: 157 mg/dL — AB (ref 70–99)
Glucose-Capillary: 160 mg/dL — ABNORMAL HIGH (ref 70–99)
Glucose-Capillary: 189 mg/dL — ABNORMAL HIGH (ref 70–99)
Glucose-Capillary: 214 mg/dL — ABNORMAL HIGH (ref 70–99)
Glucose-Capillary: 151 mg/dL — ABNORMAL HIGH (ref 70–99)
Glucose-Capillary: 109 mg/dL — ABNORMAL HIGH (ref 70–99)
Glucose-Capillary: 100 mg/dL — ABNORMAL HIGH (ref 70–99)
Glucose-Capillary: 132 mg/dL — ABNORMAL HIGH (ref 70–99)
Glucose-Capillary: 142 mg/dL — ABNORMAL HIGH (ref 70–99)
Glucose-Capillary: 178 mg/dL — ABNORMAL HIGH (ref 70–99)
Glucose-Capillary: 405 mg/dL — ABNORMAL HIGH (ref 70–99)
Glucose-Capillary: 324 mg/dL — ABNORMAL HIGH (ref 70–99)
Glucose-Capillary: 177 mg/dL — ABNORMAL HIGH (ref 70–99)

## 2014-02-20 LAB — BASIC METABOLIC PANEL
BUN: 14 mg/dL (ref 6–23)
BUN: 14 mg/dL (ref 6–23)
BUN: 16 mg/dL (ref 6–23)
BUN: 16 mg/dL (ref 6–23)
CALCIUM: 8.8 mg/dL (ref 8.4–10.5)
CALCIUM: 9.2 mg/dL (ref 8.4–10.5)
CHLORIDE: 104 meq/L (ref 96–112)
CHLORIDE: 105 meq/L (ref 96–112)
CO2: 17 mEq/L — ABNORMAL LOW (ref 19–32)
CO2: 17 meq/L — AB (ref 19–32)
CO2: 18 mEq/L — ABNORMAL LOW (ref 19–32)
CO2: 20 mEq/L (ref 19–32)
CREATININE: 1.05 mg/dL (ref 0.50–1.35)
CREATININE: 1.15 mg/dL (ref 0.50–1.35)
Calcium: 8.8 mg/dL (ref 8.4–10.5)
Calcium: 9 mg/dL (ref 8.4–10.5)
Chloride: 103 mEq/L (ref 96–112)
Chloride: 106 mEq/L (ref 96–112)
Creatinine, Ser: 1.1 mg/dL (ref 0.50–1.35)
Creatinine, Ser: 1.36 mg/dL — ABNORMAL HIGH (ref 0.50–1.35)
GFR calc Af Amer: 63 mL/min — ABNORMAL LOW (ref >90)
GFR calc non Af Amer: 54 mL/min — ABNORMAL LOW (ref >90)
GFR calc non Af Amer: 70 mL/min — ABNORMAL LOW (ref >90)
GFR, EST AFRICAN AMERICAN: 86 mL/min — AB (ref >90)
GFR, EST AFRICAN AMERICAN: 77 mL/min — AB (ref >90)
GFR, EST AFRICAN AMERICAN: 81 mL/min — AB (ref >90)
GFR, EST NON AFRICAN AMERICAN: 74 mL/min — AB (ref >90)
GFR, EST NON AFRICAN AMERICAN: 66 mL/min — AB (ref >90)
GLUCOSE: 220 mg/dL — AB (ref 70–99)
GLUCOSE: 86 mg/dL (ref 70–99)
Glucose, Bld: 152 mg/dL — ABNORMAL HIGH (ref 70–99)
Glucose, Bld: 397 mg/dL — ABNORMAL HIGH (ref 70–99)
POTASSIUM: 5.1 meq/L (ref 3.7–5.3)
POTASSIUM: 4.2 meq/L (ref 3.7–5.3)
Potassium: 4.3 mEq/L (ref 3.7–5.3)
Potassium: 4.2 mEq/L (ref 3.7–5.3)
SODIUM: 135 meq/L — AB (ref 137–147)
Sodium: 136 mEq/L — ABNORMAL LOW (ref 137–147)
Sodium: 138 mEq/L (ref 137–147)
Sodium: 139 mEq/L (ref 137–147)

## 2014-02-20 LAB — HEMOGLOBIN A1C
HEMOGLOBIN A1C: 10.8 % — AB (ref <5.7)
MEAN PLASMA GLUCOSE: 263 mg/dL — AB (ref <117)

## 2014-02-20 MED ORDER — INSULIN GLARGINE 100 UNIT/ML ~~LOC~~ SOLN
10.0000 [IU] | Freq: Every day | SUBCUTANEOUS | Status: DC
  Administered 2014-02-20 – 2014-02-21 (×2): 10 [IU] via SUBCUTANEOUS
  Filled 2014-02-20 (×3): qty 0.1

## 2014-02-20 MED ORDER — INSULIN ASPART 100 UNIT/ML ~~LOC~~ SOLN: 0.0000 [IU] | Freq: Every day | SUBCUTANEOUS | Status: DC

## 2014-02-20 MED ORDER — INSULIN ASPART 100 UNIT/ML ~~LOC~~ SOLN
0.0000 [IU] | Freq: Three times a day (TID) | SUBCUTANEOUS | Status: DC
  Administered 2014-02-21 (×2): 3 [IU] via SUBCUTANEOUS

## 2014-02-20 MED ORDER — GLUCERNA SHAKE PO LIQD
237.0000 mL | Freq: Three times a day (TID) | ORAL | Status: DC
  Administered 2014-02-20 – 2014-02-21 (×4): 237 mL via ORAL

## 2014-02-20 MED ORDER — SODIUM CHLORIDE 0.9 % IV BOLUS (SEPSIS)
500.0000 mL | Freq: Once | INTRAVENOUS | Status: AC
  Administered 2014-02-20: 500 mL via INTRAVENOUS

## 2014-02-20 MED ORDER — POTASSIUM CHLORIDE 10 MEQ/100ML IV SOLN
10.0000 meq | Freq: Once | INTRAVENOUS | Status: AC
  Administered 2014-02-20: 10 meq via INTRAVENOUS
  Filled 2014-02-20: qty 100

## 2014-02-20 MED ORDER — INSULIN GLARGINE 100 UNIT/ML ~~LOC~~ SOLN
8.0000 [IU] | Freq: Every day | SUBCUTANEOUS | Status: DC
  Administered 2014-02-20: 8 [IU] via SUBCUTANEOUS
  Filled 2014-02-20 (×2): qty 0.08

## 2014-02-20 MED ORDER — INSULIN ASPART 100 UNIT/ML ~~LOC~~ SOLN
15.0000 [IU] | Freq: Once | SUBCUTANEOUS | Status: AC
  Administered 2014-02-20: 15 [IU] via SUBCUTANEOUS

## 2014-02-20 MED ORDER — INSULIN ASPART 100 UNIT/ML ~~LOC~~ SOLN
0.0000 [IU] | Freq: Three times a day (TID) | SUBCUTANEOUS | Status: DC
  Administered 2014-02-20: 1 [IU] via SUBCUTANEOUS

## 2014-02-20 NOTE — Progress Notes (Signed)
INITIAL NUTRITION ASSESSMENT  DOCUMENTATION CODES Per approved criteria  -Severe malnutrition in the context of acute illness or injury   Pt meets criteria for severe MALNUTRITION in the context of acute illness as evidenced by 3% wt loss x 1 week, moderate fat and muscle depletion, <75% estimated energy intake x 1 month.  INTERVENTION: Glucerna Shake po TID, each supplement provides 220 kcal and 10 grams of protein  NUTRITION DIAGNOSIS: Inadequate oral intake related to decreased appetite as evidenced by 3% wt loss x 1 week, moderate fat and muscle depletion.   Goal: Pt will meet >90% of estimated nutritional needs  Monitor:  PO/supplement intake, labs, weight changes, I/O's  Reason for Assessment: MST=2  63 y.o. male  Admitting Dx: DKA (diabetic ketoacidoses)  ASSESSMENT: Pt admitted with DKA. Pt familiar to RD due to recent outpatient evaluation.  Pt with flat affect, but very cooperative. He reports his appetite is "okay". He does not drink nutritional supplements at home, but reports "someone talked to me about getting one". He is agreeable to Glucerna.  He is on Megace at home. He complains of early satiety, reporting that he is only able to eat small amount at a time.  Wt hx reveals UBW: 145#. Wt change of 3% x 1 week is clinically significant.  Nutrition Focused Physical Exam:  Subcutaneous Fat:  Orbital Region: moderate depletion Upper Arm Region: mild depletion Thoracic and Lumbar Region: moderate depletion  Muscle:  Temple Region: mild depletion Clavicle Bone Region: moderate depletion Clavicle and Acromion Bone Region: WDL Scapular Bone Region: WDL Dorsal Hand: moderate depletion Patellar Region: moderate depletion Anterior Thigh Region: moderate depletion Posterior Calf Region: moderate depletion  Edema: none present  Height: Ht Readings from Last 1 Encounters:  02/19/14 5' 6.5" (1.689 m)    Weight: Wt Readings from Last 1 Encounters:  02/19/14  127 lb 3.3 oz (57.7 kg)    Ideal Body Weight: 145#  % Ideal Body Weight: 88%  Wt Readings from Last 10 Encounters:  02/19/14 127 lb 3.3 oz (57.7 kg)  02/15/14 131 lb (59.421 kg)  10/21/12 137 lb (62.143 kg)  10/21/12 137 lb (62.143 kg)  10/20/12 137 lb (62.143 kg)  09/21/12 137 lb 9.6 oz (62.415 kg)  05/20/11 144 lb 3.2 oz (65.409 kg)  01/07/11 145 lb 6.4 oz (65.953 kg)    Usual Body Weight: 145#  % Usual Body Weight: 88%  BMI:  Body mass index is 20.23 kg/(m^2). Meets criteria for normal weight.  Estimated Nutritional Needs: Kcal: 1443-1731 Protein: 58-72 grams Fluid: 1.4-1.7 L  Skin: WDL  Diet Order: Carb Control  EDUCATION NEEDS: -Education needs addressed   Intake/Output Summary (Last 24 hours) at 02/20/14 1234 Last data filed at 02/20/14 1033  Gross per 24 hour  Intake 466.25 ml  Output   1550 ml  Net -1083.75 ml    Last BM: 02/19/14  Labs:   Recent Labs Lab 02/20/14 02/20/14 0341 02/20/14 0741  NA 139 136* 138  K 4.2 4.3 4.2  CL 105 103 106  CO2 18* 17* 20  BUN 16 14 14   CREATININE 1.10 1.05 1.15  CALCIUM 9.0 8.8 9.2  GLUCOSE 152* 220* 86    CBG (last 3)   Recent Labs  02/20/14 0732 02/20/14 0856 02/20/14 1002  GLUCAP 100* 132* 142*    Scheduled Meds: . amLODipine  5 mg Oral Daily  . cloNIDine  0.2 mg Oral TID  . heparin  5,000 Units Subcutaneous 3 times per day  .  insulin aspart  0-5 Units Subcutaneous QHS  . insulin aspart  0-9 Units Subcutaneous TID WC  . insulin glargine  10 Units Subcutaneous Daily  . irbesartan  300 mg Oral Daily  . pantoprazole  40 mg Oral Daily  . simvastatin  20 mg Oral QODAY    Continuous Infusions: . sodium chloride 125 mL/hr at 02/19/14 2305  . dextrose 5 % and 0.45% NaCl Stopped (02/20/14 1207)  . dextrose 5 % and 0.45% NaCl Stopped (02/20/14 1207)    Past Medical History  Diagnosis Date  . HTN (hypertension)   . Diabetes mellitus   . Hyperlipidemia   . Fatty liver   . GERD  (gastroesophageal reflux disease)     Past Surgical History  Procedure Laterality Date  . Back surgery    . Colonoscopy  01/2007    Dr. [name], reviewed report, mild diverticulosis. Prep adequate. Next TCS due 01/2017  . Scalp mass  4-5 yrs ago    Dr [name]  . Esophagogastroduodenoscopy N/A 10/21/2012    RDE:YCXKGYJE GASTRITIS  . Mass excision Right 10/26/2012    Procedure: EXCISION MASS;  Surgeon: [name], MD;  Location: [facility];  Service: General;  Laterality: Right;    Shondra Capps A. Mayford Knife, RD, LDN Pager: [phone] After hours Pager: [phone]

## 2014-02-20 NOTE — Progress Notes (Signed)
[facility] PROGRESS NOTE  EDRIC BIERCE ZOX:[id number] DOB: 09/28/1950 DOA: 02/19/2014  PCP: Lenise Herald, PA-C  Brief HPI: Chase Briggs is a 63 y.o. male h/o NIDDM, HTN, GERD. He presented to the ED with gradually worsening hyperglycemia over the past 5 days. Weight loss of 5 lbs in the past 4 days. Polyuria and polydipsia in past week. CBGs in the 500s today so he came in to the ED. No changes in diet. Has had significant stress in life over past week.   Past medical history:  Past Medical History  Diagnosis Date  . HTN (hypertension)   . Diabetes mellitus   . Hyperlipidemia   . Fatty liver   . GERD (gastroesophageal reflux disease)     Consultants: None  Procedures: None  Antibiotics: None  Subjective: Patient denies any complaints. Denies non compliance with his medications. Has been on Metformin for a long time. No recent dose changes.  Objective: Vital Signs  Filed Vitals:   02/19/14 2115 02/19/14 2208 02/20/14 0446 02/20/14 0921  BP: 171/93 180/96 146/86 156/89  Pulse: 87 74 61 60  Temp:  98.7 F (37.1 C) 98.5 F (36.9 C) 98.5 F (36.9 C)  TempSrc:    Oral  Resp: 23 18 18 18   Height:  5' 6.5" (1.689 m)    Weight:  57.7 kg (127 lb 3.3 oz)    SpO2: 90% 100% 98% 100%    Intake/Output Summary (Last 24 hours) at 02/20/14 1105 Last data filed at 02/20/14 1033  Gross per 24 hour  Intake 466.25 ml  Output   1550 ml  Net -1083.75 ml   Filed Weights   02/19/14 1331 02/19/14 2208  Weight: 58.514 kg (129 lb) 57.7 kg (127 lb 3.3 oz)    General appearance: alert, cooperative, appears stated age and no distress Resp: clear to auscultation bilaterally Cardio: regular rate and rhythm, S1, S2 normal, no murmur, click, rub or gallop GI: soft, non-tender; bowel sounds normal; no masses,  no organomegaly Extremities: extremities normal, atraumatic, no cyanosis or edema Neurologic: Alert and oriented X 3, normal strength and tone. Normal  symmetric reflexes. Normal coordination and gait  Lab Results:  Basic Metabolic Panel:  Recent Labs Lab 02/19/14 1340 02/19/14 2046 02/20/14 02/20/14 0341 02/20/14 0741  NA 133* 135* 139 136* 138  K 4.8 4.3 4.2 4.3 4.2  CL 100 101 105 103 106  CO2 16* 17* 18* 17* 20  GLUCOSE 431* 344* 152* 220* 86  BUN 22 18 16 14 14   CREATININE 1.40* 1.19 1.10 1.05 1.15  CALCIUM 9.9 9.2 9.0 8.8 9.2   Liver Function Tests:  Recent Labs Lab 02/19/14 1340  AST 17  ALT 14  ALKPHOS 135*  BILITOT 0.4  PROT 8.0  ALBUMIN 4.1   CBC:  Recent Labs Lab 02/19/14 1340 02/19/14 2046  WBC 10.0 9.3  HGB 15.2 14.7  HCT 42.2 40.8  MCV 89.4 88.5  PLT 176 179   CBG:  Recent Labs Lab 02/20/14 0548 02/20/14 0657 02/20/14 0732 02/20/14 0856 02/20/14 1002  GLUCAP 151* 109* 100* 132* 142*    Studies/Results: Dg Chest 2 View  02/19/2014   CLINICAL DATA:  Cough.  Smoker.  EXAM: CHEST  2 VIEW  COMPARISON:  PA and lateral chest 01/31/2014 and 09/30/2012.  FINDINGS: The lungs are clear. Heart size is normal. No pneumothorax or pleural effusion. Thoracic spondylosis noted.  IMPRESSION: No acute abnormality.   Electronically Signed   By: Drusilla Kanner M.D.  On: 02/19/2014 18:55    Medications:  Scheduled: . amLODipine  5 mg Oral Daily  . cloNIDine  0.2 mg Oral TID  . heparin  5,000 Units Subcutaneous 3 times per day  . insulin aspart  0-5 Units Subcutaneous QHS  . insulin aspart  0-9 Units Subcutaneous TID WC  . insulin glargine  10 Units Subcutaneous Daily  . irbesartan  300 mg Oral Daily  . pantoprazole  40 mg Oral Daily  . simvastatin  20 mg Oral QODAY   Continuous: . sodium chloride 125 mL/hr at 02/19/14 2305  . dextrose 5 % and 0.45% NaCl    . dextrose 5 % and 0.45% NaCl 75 mL/hr at 02/19/14 2347  . insulin (NOVOLIN-R) infusion 0.5 Units/hr (02/20/14 [id number])   JWJ:XBJYNWGNPRN:dextrose, diazepam  Assessment/Plan:  Principal Problem:   DKA (diabetic ketoacidoses) Active Problems:   HTN  (hypertension)   HTN (hypertension), benign   DM type 2 (diabetes mellitus, type 2)    DKA Resolved. Start lantus and stop insulin infusion. Resume diet.  Dm2, uncontrolled Appears to have progressed. Now requires insulin. Check HbA1c. SSI. Lantus. Diabetes Education.  Benign Hypertension Continue home medications. BP not well controlled. Monitor for now.   Code Status: Full Code  DVT Prophylaxis: Heparin    Family Communication: Discussed with patient and his wife.  Disposition Plan: Anticipate discharge home in AM. Mobilize.    LOS: 1 day   Osvaldo ShipperGokul Guilianna Mckoy  [facility] Pager [phone] 02/20/2014, 11:05 AM  If 8PM-8AM, please contact night-coverage at [email or web address], password [email or web address]   Disclaimer: This note was dictated with voice recognition software. Similar sounding words can inadvertently be transcribed and may not be corrected upon review.

## 2014-02-21 DIAGNOSIS — E43 Unspecified severe protein-calorie malnutrition: Secondary | ICD-10-CM

## 2014-02-21 DIAGNOSIS — E119 Type 2 diabetes mellitus without complications: Secondary | ICD-10-CM

## 2014-02-21 DIAGNOSIS — I10 Essential (primary) hypertension: Secondary | ICD-10-CM

## 2014-02-21 DIAGNOSIS — E1110 Type 2 diabetes mellitus with ketoacidosis without coma: Secondary | ICD-10-CM

## 2014-02-21 LAB — BASIC METABOLIC PANEL
BUN: 17 mg/dL (ref 6–23)
CALCIUM: 9.1 mg/dL (ref 8.4–10.5)
CO2: 21 meq/L (ref 19–32)
CREATININE: 1.35 mg/dL (ref 0.50–1.35)
Chloride: 106 mEq/L (ref 96–112)
GFR calc Af Amer: 63 mL/min — ABNORMAL LOW (ref >90)
GFR calc non Af Amer: 55 mL/min — ABNORMAL LOW (ref >90)
GLUCOSE: 207 mg/dL — AB (ref 70–99)
Potassium: 4.7 mEq/L (ref 3.7–5.3)
Sodium: 138 mEq/L (ref 137–147)

## 2014-02-21 LAB — CBC
HEMATOCRIT: 37 % — AB (ref 39.0–52.0)
Hemoglobin: 13.1 g/dL (ref 13.0–17.0)
MCH: 31.7 pg (ref 26.0–34.0)
MCHC: 35.4 g/dL (ref 30.0–36.0)
MCV: 89.6 fL (ref 78.0–100.0)
PLATELETS: 180 10*3/uL (ref 150–400)
RBC: 4.13 MIL/uL — AB (ref 4.22–5.81)
RDW: 12.6 % (ref 11.5–15.5)
WBC: 10.7 10*3/uL — ABNORMAL HIGH (ref 4.0–10.5)

## 2014-02-21 LAB — GLUCOSE, CAPILLARY
Glucose-Capillary: 128 mg/dL — ABNORMAL HIGH (ref 70–99)
Glucose-Capillary: 145 mg/dL — ABNORMAL HIGH (ref 70–99)

## 2014-02-21 MED ORDER — INSULIN GLARGINE 100 UNIT/ML ~~LOC~~ SOLN: 10.0000 [IU] | Freq: Every day | SUBCUTANEOUS | Status: DC

## 2014-02-21 MED ORDER — GLUCERNA SHAKE PO LIQD: 237.0000 mL | Freq: Three times a day (TID) | ORAL | Status: DC

## 2014-02-21 MED ORDER — INSULIN GLULISINE 100 UNIT/ML SOLOSTAR PEN: 10.0000 [IU] | PEN_INJECTOR | Freq: Every day | SUBCUTANEOUS | Status: DC

## 2014-02-21 MED ORDER — INSULIN STARTER KIT- PEN NEEDLES (ENGLISH): 1.0000 | Freq: Once | Status: DC

## 2014-02-21 MED ORDER — INSULIN GLARGINE 100 UNIT/ML SOLOSTAR PEN: 10.0000 [IU] | PEN_INJECTOR | Freq: Every day | SUBCUTANEOUS | Status: DC

## 2014-02-21 MED ORDER — INSULIN STARTER KIT- PEN NEEDLES (ENGLISH)
1.0000 | Freq: Once | Status: AC
  Administered 2014-02-21: 1
  Filled 2014-02-21: qty 1

## 2014-02-21 MED ORDER — LIVING WELL WITH DIABETES BOOK
Freq: Once | Status: AC
  Administered 2014-02-21: 16:00:00
  Filled 2014-02-21: qty 1

## 2014-02-21 MED ORDER — INSULIN PEN NEEDLE 31G X 5 MM MISC: Status: DC

## 2014-02-21 NOTE — Progress Notes (Signed)
Inpatient Diabetes Program Recommendations  AACE/ADA: New Consensus Statement on Inpatient Glycemic Control (2013)  Target Ranges:  Prepandial:   less than 140 mg/dL      Peak postprandial:   less than 180 mg/dL (1-2 hours)      Critically ill patients:  140 - 180 mg/dL   This coordinator met with patient and wife to instruct how to use insulin pen.  Patient and wife did return demonstration using teach back method.  Expect good compliance.  Discussed timing of insulin dose, monitoring glucose, hypoglycemia s/s and treatment and when to call MD.  Patient will be given an insulin starter kit at discharge. No further questions/concerns at the end of our visit.   Thank you  Raoul Pitch BSN, RN,CDE Inpatient Diabetes Coordinator [phone] (team pager)

## 2014-02-21 NOTE — Progress Notes (Signed)
Insulin starter kit and living with diabetis booklet given to patient.

## 2014-02-21 NOTE — Progress Notes (Signed)
Several Diabetes education printed and given to the patient like 1.Basic Carbohydrate counting. 2.Diabetes Meal Planning Guide 3.Diabetes and Exercise 4.Basic Food Portioning for Diabetes. 5. Signs and symptoms of Hyperglycemia  And Hypoglycemia. 5.Precautions and side effect of insulin and oral diabetic medicine. All of these printed from [place] and explained to the patient and his wife. Was seen also by diabetic  Educator.

## 2014-02-21 NOTE — Discharge Summary (Signed)
Physician Discharge Summary  Chase Briggs KVQ:[id number] DOB: Oct 24, 1950 DOA: 02/19/2014  PCP: PROVIDER NOT IN SYSTEM  Admit date: 02/19/2014 Discharge date: 02/21/2014  Time spent: 25 minutes  Recommendations for Outpatient Follow-up:  1. New medications: Lantus pen 10 units nightly 2. Medication change: Metformin discontinued 3. Patient will followup with PCP in 2 weeks  Discharge Diagnoses:  Principal Problem:   DKA (diabetic ketoacidoses) Active Problems:   HTN (hypertension)   HTN (hypertension), benign   DM type 2 (diabetes mellitus, type 2)   Malnutrition of moderate degree   Discharge Condition: Improved, being discharged home  Diet recommendation: Carb modified, low sodium  Filed Weights   02/19/14 1331 02/19/14 2208 02/20/14 2012  Weight: 58.514 kg (129 lb) 57.7 kg (127 lb 3.3 oz) 60.1 kg (132 lb 7.9 oz)    History of present illness:  63 year old African American male past medical history of diabetes mellitus on metformin plus hypertension who presented with worsening hyperglycemia over the past 5 days and CBGs noted to be in the 500s. Patient reports he has been taking his medication. He was found to be in DKA and started on insulin drip plus IV fluids.  Hospital Course:  Principal Problem:   DKA (diabetic ketoacidoses): Patient on DKA protocol. By that evening, able to be turned off of insulin drip. Start on Lantus 10 units each bedtime which she has been able to tolerate. CBGs have remained stable. Her condition given an A1c at 10.8, is that patient start on Lantus. Given slightly elevated creatinine, had discontinued metformin  Active Problems:   HTN (hypertension): Mildly elevated blood pressures. Following fluid resuscitation, restarted on home meds    DM type 2 (diabetes mellitus, type 2): See above Severe protein calorie malnutrition: Patient meets criteria in the context of acute illness as evidence by 3% weight loss x1 week, moderate fat and muscle  dictation and less than 75% estimated energy intake times one month. Started on Glucerna shakes 3 times a day  Procedures:  None  Consultations:  None  Discharge Exam: Filed Vitals:   02/21/14 0941  BP: 171/79  Pulse: 60  Temp: 98.3 F (36.8 C)  Resp: 18    General: Alert and oriented x3, no acute distress Cardiovascular: Regular rate and rhythm, S1-S2 Respiratory: Clear to auscultation bilaterally  Discharge Instructions You were cared for by a hospitalist during your hospital stay. If you have any questions about your discharge medications or the care you received while you were in the hospital after you are discharged, you can call the unit and asked to speak with the hospitalist on call if the hospitalist that took care of you is not available. Once you are discharged, your primary care physician will handle any further medical issues. Please note that NO REFILLS for any discharge medications will be authorized once you are discharged, as it is imperative that you return to your primary care physician (or establish a relationship with a primary care physician if you do not have one) for your aftercare needs so that they can reassess your need for medications and monitor your lab values.  Discharge Instructions   Diet - low sodium heart healthy    Complete by:  As directed      Diet Carb Modified    Complete by:  As directed      Increase activity slowly    Complete by:  As directed             Medication List  STOP taking these medications       metFORMIN 500 MG 24 hr tablet  Commonly known as:  GLUCOPHAGE-XR      TAKE these medications       amLODipine 10 MG tablet  Commonly known as:  NORVASC  Take 10 mg by mouth daily.     amLODipine-valsartan 5-320 MG per tablet  Commonly known as:  EXFORGE  Take 1 tablet by mouth daily.     cloNIDine 0.2 MG tablet  Commonly known as:  CATAPRES  Take 0.2 mg by mouth 3 (three) times daily.     diazepam 5 MG tablet   Commonly known as:  VALIUM  Take 5 mg by mouth as needed for anxiety.     feeding supplement (GLUCERNA SHAKE) Liqd  Take 237 mLs by mouth 3 (three) times daily between meals.     Insulin Glargine 100 UNIT/ML Solostar Pen  Commonly known as:  LANTUS  Inject 10 Units into the skin daily at 10 pm.     Insulin Pen Needle 31G X 5 MM Misc  Use as directed     insulin starter kit- pen needles Misc  1 kit by Other route once.     losartan 100 MG tablet  Commonly known as:  COZAAR  Take 100 mg by mouth daily.     megestrol 40 MG/ML suspension  Commonly known as:  MEGACE  Take by mouth daily.     omeprazole 20 MG capsule  Commonly known as:  PRILOSEC  Take 1 capsule (20 mg total) by mouth daily.     simvastatin 20 MG tablet  Commonly known as:  ZOCOR  Take 20 mg by mouth every other day.       No Known Allergies    The results of significant diagnostics from this hospitalization (including imaging, microbiology, ancillary and laboratory) are listed below for reference.    Significant Diagnostic Studies: Dg Chest 2 View  02/19/2014   CLINICAL DATA:  Cough.  Smoker.  EXAM: CHEST  2 VIEW  COMPARISON:  PA and lateral chest 01/31/2014 and 09/30/2012.  FINDINGS: The lungs are clear. Heart size is normal. No pneumothorax or pleural effusion. Thoracic spondylosis noted.  IMPRESSION: No acute abnormality.   Electronically Signed   By: Inge Rise M.D.   On: 02/19/2014 18:55   Dg Chest 2 View  01/31/2014   CLINICAL DATA:  Cigarette smoker, weight loss  EXAM: CHEST  2 VIEW  COMPARISON:  09/30/2012  FINDINGS: Normal heart size, mediastinal contours, and pulmonary vascularity.  Lungs clear.  No pleural effusion or pneumothorax.  Scattered multilevel endplate spur formation thoracic spine.  IMPRESSION: No acute abnormalities.   Electronically Signed   By: Lavonia Dana M.D.   On: 01/31/2014 12:49    Microbiology: No results found for this or any previous visit (from the past 240  hour(s)).   Labs: Basic Metabolic Panel:  Recent Labs Lab 02/20/14 02/20/14 0341 02/20/14 0741 02/20/14 1515 02/21/14 0142  NA 139 136* 138 135* 138  K 4.2 4.3 4.2 5.1 4.7  CL 105 103 106 104 106  CO2 18* 17* 20 17* 21  GLUCOSE 152* 220* 86 397* 207*  BUN $Re'16 14 14 16 17  'UuE$ CREATININE 1.10 1.05 1.15 1.36* 1.35  CALCIUM 9.0 8.8 9.2 8.8 9.1   Liver Function Tests:  Recent Labs Lab 02/19/14 1340  AST 17  ALT 14  ALKPHOS 135*  BILITOT 0.4  PROT 8.0  ALBUMIN 4.1   No results  found for this basename: LIPASE, AMYLASE,  in the last 168 hours No results found for this basename: AMMONIA,  in the last 168 hours CBC:  Recent Labs Lab 02/19/14 1340 02/19/14 2046 02/21/14 0146  WBC 10.0 9.3 10.7*  HGB 15.2 14.7 13.1  HCT 42.2 40.8 37.0*  MCV 89.4 88.5 89.6  PLT 176 179 180   Cardiac Enzymes: No results found for this basename: CKTOTAL, CKMB, CKMBINDEX, TROPONINI,  in the last 168 hours BNP: BNP (last 3 results) No results found for this basename: PROBNP,  in the last 8760 hours CBG:  Recent Labs Lab 02/20/14 1657 02/20/14 1854 02/20/14 2008 02/21/14 0729 02/21/14 1138  GLUCAP 405* 324* 177* 128* 145*       Signed:  Stephie Xu K  [facility] 02/21/2014, 12:21 PM

## 2015-07-12 ENCOUNTER — Other Ambulatory Visit: Payer: Self-pay | Admitting: "Endocrinology

## 2015-07-28 ENCOUNTER — Other Ambulatory Visit: Payer: Self-pay | Admitting: "Endocrinology

## 2015-08-19 LAB — HEMOGLOBIN A1C: Hgb A1c MFr Bld: 6.9 % — AB (ref 4.0–6.0)

## 2015-08-27 ENCOUNTER — Ambulatory Visit (INDEPENDENT_AMBULATORY_CARE_PROVIDER_SITE_OTHER): Admitting: "Endocrinology

## 2015-08-27 ENCOUNTER — Encounter: Payer: Self-pay | Admitting: "Endocrinology

## 2015-08-27 VITALS — BP 170/60 | HR 86 | Ht 67.0 in | Wt 132.0 lb

## 2015-08-27 DIAGNOSIS — I10 Essential (primary) hypertension: Secondary | ICD-10-CM

## 2015-08-27 DIAGNOSIS — E785 Hyperlipidemia, unspecified: Secondary | ICD-10-CM | POA: Diagnosis not present

## 2015-08-27 DIAGNOSIS — E782 Mixed hyperlipidemia: Secondary | ICD-10-CM | POA: Insufficient documentation

## 2015-08-27 DIAGNOSIS — E119 Type 2 diabetes mellitus without complications: Secondary | ICD-10-CM | POA: Diagnosis not present

## 2015-08-27 DIAGNOSIS — E559 Vitamin D deficiency, unspecified: Secondary | ICD-10-CM

## 2015-08-27 MED ORDER — VITAMIN D (ERGOCALCIFEROL) 1.25 MG (50000 UNIT) PO CAPS: 50000.0000 [IU] | ORAL_CAPSULE | ORAL | Status: DC

## 2015-08-27 NOTE — Progress Notes (Signed)
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 HTN (hypertension)   . Diabetes mellitus   . Hyperlipidemia   . Fatty liver   . GERD (gastroesophageal reflux disease)    Past Surgical History  Procedure Laterality Date  . Back surgery    . Colonoscopy  01/2007    Dr. Arnoldo Morale, reviewed report, mild diverticulosis. Prep adequate. Next TCS due 01/2017  . Scalp mass  4-5 yrs ago    Dr Arnoldo Morale  . Esophagogastroduodenoscopy N/A 10/21/2012    [id number] GASTRITIS  . Mass excision Right 10/26/2012    Procedure: EXCISION MASS;  Surgeon: Jamesetta So, MD;  Location: [facility];  Service: General;  Laterality: Right;   Social History   Social History  . Marital Status: Married    Spouse Name: N/A  . Number of Children: 1  . Years of Education: N/A   Occupational History  .      [employer], heavy lifting   Social History Main Topics  . Smoking status: Current Every Day Smoker -- 0.50 packs/day for 25 years    Types: Cigarettes  . Smokeless tobacco: None  . Alcohol Use: No     Comment: hx heavy etoh x 3 yrs, quit 30+ yrs ago  . Drug Use: No  . Sexual Activity: Yes    Birth Control/ Protection: None   Other Topics Concern  . None   Social History Narrative   Outpatient Encounter Prescriptions as of 08/27/2015  Medication Sig  . amLODipine (NORVASC) 10 MG tablet Take 10 mg by mouth daily.  Marland Kitchen aspirin EC 81 MG tablet Take 81 mg by mouth daily.  . cloNIDine (CATAPRES) 0.2 MG tablet Take 0.2 mg by mouth 3 (three) times daily.  Marland Kitchen losartan (COZAAR) 100 MG tablet Take 100 mg by mouth daily.  . metFORMIN (GLUCOPHAGE) 500 MG tablet TAKE 1 TABLET TWICE A DAY  . simvastatin (ZOCOR) 20 MG tablet Take 20 mg by mouth every other day.   Marland Kitchen omeprazole (PRILOSEC) 20 MG capsule Take 1 capsule (20 mg total) by mouth daily.  . Vitamin D, Ergocalciferol, (DRISDOL) 50000 UNITS CAPS capsule Take 1 capsule  (50,000 Units total) by mouth every 7 (seven) days.  . [DISCONTINUED] amLODipine-valsartan (EXFORGE) 5-320 MG per tablet Take 1 tablet by mouth daily.    . [DISCONTINUED] diazepam (VALIUM) 5 MG tablet Take 5 mg by mouth as needed for anxiety.  . [DISCONTINUED] feeding supplement, GLUCERNA SHAKE, (GLUCERNA SHAKE) LIQD Take 237 mLs by mouth 3 (three) times daily between meals.  . [DISCONTINUED] Insulin Glargine (LANTUS) 100 UNIT/ML Solostar Pen Inject 10 Units into the skin daily at 10 pm.  . [DISCONTINUED] Insulin Pen Needle 31G X 5 MM MISC Use as directed  . [DISCONTINUED] insulin starter kit- pen needles MISC 1 kit by Other route once.  . [DISCONTINUED] megestrol (MEGACE) 40 MG/ML suspension Take by mouth daily.   No facility-administered encounter medications on file as of 08/27/2015.   ALLERGIES: No Known Allergies VACCINATION STATUS:  There is no immunization history on file for this patient.  Diabetes He presents for his follow-up diabetic visit. He has type 2 diabetes mellitus. Onset time: He was diagnosed at approximate age of 74 years. His disease course has been stable. There are no hypoglycemic associated symptoms. Pertinent negatives for hypoglycemia include no confusion, headaches, pallor or seizures. There are no diabetic  associated symptoms. Pertinent negatives for diabetes include no chest pain, no fatigue, no polydipsia, no polyphagia, no polyuria and no weakness. There are no hypoglycemic complications. Symptoms are stable. Risk factors for coronary artery disease include diabetes mellitus, dyslipidemia, hypertension, male sex and sedentary lifestyle. He is compliant with treatment most of the time. His weight is stable. When asked about meal planning, he reported none. He has not had a previous visit with a dietitian. An ACE inhibitor/angiotensin II receptor blocker is being taken. Eye exam is current.  Hypertension This is a chronic problem. The current episode started more  than 1 year ago. The problem is uncontrolled. Pertinent negatives include no chest pain, headaches, neck pain, palpitations or shortness of breath. Risk factors for coronary artery disease include smoking/tobacco exposure. Past treatments include ACE inhibitors, calcium channel blockers and central alpha agonists. The current treatment provides no improvement. Compliance problems include psychosocial issues.   Hyperlipidemia This is a chronic problem. The current episode started more than 1 year ago. Pertinent negatives include no chest pain, myalgias or shortness of breath. Current antihyperlipidemic treatment includes statins. Risk factors for coronary artery disease include dyslipidemia, diabetes mellitus, male sex, hypertension and a sedentary lifestyle.     Review of Systems  Constitutional: Negative for fatigue and unexpected weight change.  HENT: Negative for dental problem, mouth sores and trouble swallowing.   Eyes: Negative for visual disturbance.  Respiratory: Negative for cough, choking, chest tightness, shortness of breath and wheezing.   Cardiovascular: Negative for chest pain, palpitations and leg swelling.  Gastrointestinal: Negative for nausea, vomiting, abdominal pain, diarrhea, constipation and abdominal distention.  Endocrine: Negative for polydipsia, polyphagia and polyuria.  Genitourinary: Negative for dysuria, urgency, hematuria and flank pain.  Musculoskeletal: Negative for myalgias, back pain, gait problem and neck pain.  Skin: Negative for pallor, rash and wound.  Neurological: Negative for seizures, syncope, weakness, numbness and headaches.  Psychiatric/Behavioral: Negative.  Negative for confusion and dysphoric mood.    Objective:    BP 170/60 mmHg  Pulse 86  Ht _0  (1.702 m)  Wt 132 lb (59.875 kg)  BMI 20.67 kg/m2  SpO2 99%  Wt Readings from Last 3 Encounters:  08/27/15 132 lb (59.875 kg)  02/20/14 132 lb 7.9 oz (60.1 kg)  02/15/14 131 lb (59.421 kg)     Physical Exam  Constitutional: He is oriented to person, place, and time. He appears well-developed and well-nourished. He is cooperative. No distress.  HENT:  Head: Normocephalic and atraumatic.  Eyes: EOM are normal.  Neck: Normal range of motion. Neck supple. No tracheal deviation present. No thyromegaly present.  Cardiovascular: Normal rate, S1 normal, S2 normal and normal heart sounds.  Exam reveals no gallop.   No murmur heard. Pulses:      Dorsalis pedis pulses are 1+ on the right side, and 1+ on the left side.       Posterior tibial pulses are 1+ on the right side, and 1+ on the left side.  Pulmonary/Chest: Breath sounds normal. No respiratory distress. He has no wheezes.  Abdominal: Soft. Bowel sounds are normal. He exhibits no distension. There is no tenderness. There is no guarding and no CVA tenderness.  Musculoskeletal: He exhibits no edema.       Right shoulder: He exhibits no swelling and no deformity.  Neurological: He is alert and oriented to person, place, and time. He has normal strength and normal reflexes. No cranial nerve deficit or sensory deficit. Gait normal.  Skin: Skin is warm  and dry. No rash noted. No cyanosis. Nails show no clubbing.  Psychiatric: He has a normal mood and affect. His speech is normal and behavior is normal. Judgment and thought content normal. Cognition and memory are normal.     Complete Blood Count (Most recent): Lab Results  Component Value Date   WBC 10.7* 02/21/2014   HGB 13.1 02/21/2014   HCT 37.0* 02/21/2014   MCV 89.6 02/21/2014   PLT 180 02/21/2014   Chemistry (most recent): Lab Results  Component Value Date   NA 138 02/21/2014   K 4.7 02/21/2014   CL 106 02/21/2014   CO2 21 02/21/2014   BUN 17 02/21/2014   CREATININE 1.35 02/21/2014   Diabetic Labs (most recent): Lab Results  Component Value Date   HGBA1C 6.9* 08/19/2015   HGBA1C 10.8* 02/20/2014    Assessment & Plan:   1. Type 2 diabetes mellitus without  complication, without long-term current use of insulin (HCC)  -He had hyperosmolar state in the past, patient remains at a high risk for more acute and chronic complications of diabetes which include CAD, CVA, CKD, retinopathy, and neuropathy. These are all discussed in detail with the patient.  Patient came with controlled A1c of 6.9 %.   Recent labs reviewed.   - I have re-counseled the patient on diet management   by adopting a carbohydrate restricted / protein rich  Diet.  - Suggestion is made for patient to avoid simple carbohydrates   from their diet including Cakes , Desserts, Ice Cream,  Soda (  diet and regular) , Sweet Tea , Candies,  Chips, Cookies, Artificial Sweeteners,   and "Sugar-free" Products .  This will help patient to have stable blood glucose profile and potentially avoid unintended  Weight gain.  - Patient is advised to stick to a routine mealtimes to eat 3 meals  a day and avoid unnecessary snacks ( to snack only to correct hypoglycemia).  - I have approached patient with the following individualized plan to manage diabetes and patient agrees.  - Continue low dose metformin 519m po qday. he will not need insulin therapy for now. -Patient is encouraged to call clinic for blood glucose levels less than 70 or above 300 mg /dl.  - Patient is not a candidate for  incretin therapy . - Patient specific target  for A1c; LDL, HDL, Triglycerides, and  Waist Circumference were discussed in detail.  2) BP/HTN:  uncontrolled due to the fact that he did not take his blood pressure medications this morning. Continue current medications including ACEI/ARB. 3) Lipids/HPL:  continue statins. 4)  Weight/Diet:  no weight problem and his case, exercise, and carbohydrates information provided. 5. Vitamin D deficiency -New diagnosis for him. I would initiate vitamin D 50,000 units weekly for the next 12 weeks.  6) Chronic Care/Health Maintenance:  -Patient is on ACEI/ARB and Statin  medications and encouraged to continue to follow up with Ophthalmology, Podiatrist at least yearly or according to recommendations, and advised to  Quit  smoking. I have recommended yearly flu vaccine and pneumonia vaccination at least every 5 years; moderate intensity exercise for up to 150 minutes weekly; and  sleep for at least 7 hours a day.  - 25 minutes of time was spent on the care of this patient , 50% of which was applied for counseling on diabetes complications and their preventions.  - I advised patient [place] [place]  Patient  is asked to bring meter and  blood glucose logs during their next visit.   Follow up plan: -Return in about 6 months (around 02/25/2016) for diabetes, high blood pressure, high cholesterol, follow up with pre-visit labs.  Glade Lloyd, MD Phone: [phone]  Fax: [phone]   08/27/2015, 9:03 AM

## 2015-10-03 ENCOUNTER — Other Ambulatory Visit: Payer: Self-pay

## 2015-10-03 MED ORDER — AMLODIPINE BESYLATE 10 MG PO TABS: 10.0000 mg | ORAL_TABLET | Freq: Every day | ORAL | Status: DC

## 2015-10-08 ENCOUNTER — Other Ambulatory Visit: Payer: Self-pay | Admitting: "Endocrinology

## 2015-11-14 ENCOUNTER — Ambulatory Visit (INDEPENDENT_AMBULATORY_CARE_PROVIDER_SITE_OTHER): Admitting: Orthopaedic Surgery

## 2015-11-14 ENCOUNTER — Encounter: Payer: Self-pay | Admitting: Orthopaedic Surgery

## 2015-11-14 ENCOUNTER — Ambulatory Visit (HOSPITAL_COMMUNITY)
Admission: RE | Admit: 2015-11-14 | Discharge: 2015-11-14 | Disposition: A | Discharge status: Home / Self Care | Source: Ambulatory Visit | Attending: Orthopaedic Surgery | Admitting: Orthopaedic Surgery

## 2015-11-14 ENCOUNTER — Ambulatory Visit

## 2015-11-14 VITALS — BP 195/86 | HR 83 | Temp 97.7°F | Resp 16 | Ht 67.0 in | Wt 132.0 lb

## 2015-11-14 DIAGNOSIS — M25512 Pain in left shoulder: Secondary | ICD-10-CM | POA: Diagnosis not present

## 2015-11-14 NOTE — Progress Notes (Addendum)
CC: left shoulder pain for a month  Subjective:    Patient ID: Chase Briggs, male    DOB: [birth year], 65 y.o.   MRN: [id number]  Shoulder Pain  The pain is present in the left shoulder. This is a new problem. The current episode started more than 1 month ago. There has been no history of extremity trauma. The problem occurs daily. The problem has been gradually worsening. The quality of the pain is described as aching and dull. The pain is at a severity of 4/10. The pain is moderate. He has tried acetaminophen, cold, heat and rest for the symptoms. The treatment provided mild relief.   He has had pain in the left shoulder for just over a month.  He has no trauma.  He has no paresthesias or redness.  It is not getting any better.  He saw his family doctor and is now referred here.    Review of Systems  Constitutional:       He has diabetes He has hypertension He does not have COPD He smokes  HENT: Negative for congestion.   Respiratory: Negative for cough and shortness of breath.   Cardiovascular: Negative for chest pain.  Endocrine: Positive for cold intolerance.  Musculoskeletal: Positive for myalgias and arthralgias.  Allergic/Immunologic: Positive for environmental allergies.  All other systems reviewed and are negative.  Past Medical History  Diagnosis Date  . HTN (hypertension)   . Diabetes mellitus   . Hyperlipidemia   . Fatty liver   . GERD (gastroesophageal reflux disease)    Social History   Social History  . Marital Status: Married    Spouse Name: N/A  . Number of Children: 1  . Years of Education: N/A   Occupational History  .      [employer], heavy lifting   Social History Main Topics  . Smoking status: Current Every Day Smoker -- 0.50 packs/day for 25 years    Types: Cigarettes  . Smokeless tobacco: Not on file  . Alcohol Use: No     Comment: hx heavy etoh x 3 yrs, quit 30+ yrs ago  . Drug Use: No  . Sexual Activity: Yes    Birth Control/  Protection: None   Other Topics Concern  . Not on file   Social History Narrative   Past Surgical History  Procedure Laterality Date  . Back surgery    . Colonoscopy  01/2007    Dr. [name], reviewed report, mild diverticulosis. Prep adequate. Next TCS due 01/2017  . Scalp mass  4-5 yrs ago    Dr [name]  . Esophagogastroduodenoscopy N/A 10/21/2012    [id number] GASTRITIS  . Mass excision Right 10/26/2012    Procedure: EXCISION MASS;  Surgeon: [name], MD;  Location: [facility];  Service: General;  Laterality: Right;     BP 195/86 mmHg  Pulse 83  Temp(Src) 97.7 F (36.5 C)  Resp 16  Ht  (1.702 m)  Wt 132 lb (59.875 kg)  BMI 20.67 kg/m2  Objective:   Physical Exam  Constitutional: He is oriented to person, place, and time. He appears well-developed and well-nourished.  HENT:  Head: Normocephalic and atraumatic.  Eyes: Conjunctivae and EOM are normal. Pupils are equal, round, and reactive to light.  Neck: Normal range of motion. Neck supple.  Cardiovascular: Normal rate, regular rhythm and intact distal pulses.   Pulmonary/Chest: Effort normal and breath sounds normal.  Abdominal: Soft.  Musculoskeletal: He exhibits tenderness (Pain left shoulder with no  effusion.  Decreaed motion.).       Left shoulder: He exhibits decreased range of motion and tenderness.       Arms: Neurological: He is alert and oriented to person, place, and time. He has normal reflexes. He displays normal reflexes. No cranial nerve deficit. He exhibits normal muscle tone. Coordination normal.  Skin: Skin is warm and dry.  Psychiatric: He has a normal mood and affect. His behavior is normal. Judgment and thought content normal.   Examination of left Upper Extremity is done.  Inspection:   Overall:  Elbow non-tender without crepitus or defects, forearm non-tender without crepitus or defects, wrist non-tender without crepitus or defects, hand non-tender.    Shoulder: with glenohumeral joint  tenderness, without effusion.   Upper arm: without swelling and tenderness   Range of motion:   Overall:  Full range of motion of the elbow, full range of motion of wrist and full range of motion in fingers.   Shoulder:  left  145 degrees forward flexion; 100 degrees abduction; 35 degrees internal rotation, 35 degrees external rotation, 15 degrees extension, 40 degrees adduction.   Stability:   Overall:  Shoulder, elbow and wrist stable   Strength and Tone:   Overall full shoulder muscles strength, full upper arm strength and normal upper arm bulk and tone.  The patient request injection, verbal consent was obtained.  The left shoulder was prepped appropriately after time out was performed.   Sterile technique was observed and injection of 1 cc of Depo-Medrol 40 mg with several cc's of plain xylocaine. Anesthesia was provided by ethyl chloride and a 20-gauge needle was used to inject the shoulder area. A posterior approach was used.  The injection was tolerated well.  A band aid dressing was applied.  The patient was advised to apply ice later today and tomorrow to the injection sight as needed.  I talked to him about his diabetes. His A1C is 6.6.  He is watching his blood sugars.  His hypertension is well controlled.  He has been retired for two years now and tries to stay active.    Encounter Diagnosis  Name Primary?  . Left shoulder pain Yes    Assessment & Plan:  Left shoulder pain  Return in three weeks.  Consider MRI if not improved.

## 2015-11-14 NOTE — Patient Instructions (Signed)

## 2015-12-05 ENCOUNTER — Encounter: Payer: Self-pay | Admitting: Orthopaedic Surgery

## 2015-12-05 ENCOUNTER — Ambulatory Visit (INDEPENDENT_AMBULATORY_CARE_PROVIDER_SITE_OTHER): Admitting: Orthopaedic Surgery

## 2015-12-05 VITALS — BP 173/94 | HR 81 | Temp 97.7°F | Resp 16 | Ht 67.0 in | Wt 133.0 lb

## 2015-12-05 DIAGNOSIS — M25512 Pain in left shoulder: Secondary | ICD-10-CM | POA: Diagnosis not present

## 2015-12-05 NOTE — Progress Notes (Signed)
Patient Chase Briggs, male DOB:1950-10-18, 65 y.o. JJO:[id number]  Chief Complaint  Patient presents with  . Follow-up    follow up left shoulder    HPI  Chase RainbowCalvin E Briggs is a 65 y.o. male who has left shoulder pain.  He had an injection last time.  He is much better.  He is doing his exercises. He is taking his medicine.  He has no paresthesias.  He has better motion.  He has been able to more things at home.  He is pleased with his progress.  HPI  Body mass index is 20.83 kg/(m^2).   Review of Systems  Constitutional:       He has diabetes He has hypertension He does not have COPD He smokes  HENT: Negative for congestion.   Respiratory: Negative for shortness of breath.   Cardiovascular: Negative for chest pain.  Endocrine: Positive for cold intolerance.  Musculoskeletal: Positive for myalgias and arthralgias.  All other systems reviewed and are negative.   Past Medical History  Diagnosis Date  . HTN (hypertension)   . Diabetes mellitus   . Hyperlipidemia   . Fatty liver   . GERD (gastroesophageal reflux disease)     Past Surgical History  Procedure Laterality Date  . Back surgery    . Colonoscopy  01/2007    Dr. Lovell SheehanJenkins, reviewed report, mild diverticulosis. Prep adequate. Next TCS due 01/2017  . Scalp mass  4-5 yrs ago    Dr Lovell SheehanJenkins  . Esophagogastroduodenoscopy N/A 10/21/2012    [id number]:MODERATE GASTRITIS  . Mass excision Right 10/26/2012    Procedure: EXCISION MASS;  Surgeon: Dalia HeadingMark A Jenkins, MD;  Location: [facility];  Service: General;  Laterality: Right;    Family History  Problem Relation Age of Onset  . Colon cancer Neg Hx   . Liver disease Neg Hx   . Diabetes Mother   . Diabetes Father   . Diabetes Brother     Social History Social History  Substance Use Topics  . Smoking status: Current Every Day Smoker -- 0.50 packs/day for 25 years    Types: Cigarettes  . Smokeless tobacco: None  . Alcohol Use: No     Comment: hx heavy etoh x 3 yrs,  quit 30+ yrs ago    No Known Allergies  Current Outpatient Prescriptions  Medication Sig Dispense Refill  . amLODipine (NORVASC) 10 MG tablet Take 1 tablet (10 mg total) by mouth daily. 90 tablet 0  . aspirin EC 81 MG tablet Take 81 mg by mouth daily.    . cloNIDine (CATAPRES) 0.2 MG tablet Take 0.2 mg by mouth 3 (three) times daily.    Marland Kitchen. losartan (COZAAR) 100 MG tablet Take 100 mg by mouth daily.    . metFORMIN (GLUCOPHAGE) 500 MG tablet TAKE 1 TABLET TWICE A DAY 180 tablet 0  . omeprazole (PRILOSEC) 20 MG capsule Take 1 capsule (20 mg total) by mouth daily. 31 capsule 2  . simvastatin (ZOCOR) 20 MG tablet Take 20 mg by mouth every other day.     . Vitamin D, Ergocalciferol, (DRISDOL) 50000 UNITS CAPS capsule Take 1 capsule (50,000 Units total) by mouth every 7 (seven) days. 12 capsule 0   No current facility-administered medications for this visit.     Physical Exam  Blood pressure 173/94, pulse 81, temperature 97.7 F (36.5 C), resp. rate 16, height 5\' 7"  (1.702 m), weight 133 lb (60.328 kg).  Constitutional: overall normal hygiene, normal nutrition, well developed, normal grooming, normal body habitus.  Assistive device:none  Musculoskeletal: gait and station Limp none, muscle tone and strength are normal, no tremors or atrophy is present.  .  Neurological: coordination overall normal.  Deep tendon reflex/nerve stretch intact.  Sensation normal.  Cranial nerves II-XII intact.   Skin:   normal overall no scars, lesions, ulcers or rashes. No psoriasis.  Psychiatric: Alert and oriented x 3.  Recent memory intact, remote memory unclear.  Normal mood and affect. Well groomed.  Good eye contact.  Cardiovascular: overall no swelling, no varicosities, no edema bilaterally, normal temperatures of the legs and arms, no clubbing, cyanosis and good capillary refill.  Lymphatic: palpation is normal.  Examination of left Upper Extremity is done.  Inspection:   Overall:  Elbow  non-tender without crepitus or defects, forearm non-tender without crepitus or defects, wrist non-tender without crepitus or defects, hand non-tender.    Shoulder: with glenohumeral joint tenderness, without effusion.   Upper arm: without swelling and tenderness   Range of motion:   Overall:  Full range of motion of the elbow, full range of motion of wrist and full range of motion in fingers.   Shoulder:  left  180 degrees forward flexion; 160 degrees abduction; 40 degrees internal rotation, 40 degrees external rotation, 30 degrees extension, 40 degrees adduction.   Stability:   Overall:  Shoulder, elbow and wrist stable   Strength and Tone:   Overall full shoulder muscles strength, full upper arm strength and normal upper arm bulk and tone.  The patient has been educated about the nature of the problem(s) and counseled on treatment options.  The patient appeared to understand what I have discussed and is in agreement with it.  Encounter Diagnosis  Name Primary?  . Left shoulder pain Yes    PLAN Call if any problems.  Precautions discussed.  Continue current medications.   Return to clinic 1 month

## 2015-12-05 NOTE — Patient Instructions (Signed)
Smoking Cessation, Tips for Success If you are ready to quit smoking, congratulations! You have chosen to help yourself be healthier. Cigarettes bring nicotine, tar, carbon monoxide, and other irritants into your body. Your lungs, heart, and blood vessels will be able to work better without these poisons. There are many different ways to quit smoking. Nicotine gum, nicotine patches, a nicotine inhaler, or nicotine nasal spray can help with physical craving. Hypnosis, support groups, and medicines help break the habit of smoking. WHAT THINGS CAN I DO TO MAKE QUITTING EASIER?  Here are some tips to help you quit for good:  Pick a date when you will quit smoking completely. Tell all of your friends and family about your plan to quit on that date.  Do not try to slowly cut down on the number of cigarettes you are smoking. Pick a quit date and quit smoking completely starting on that day.  Throw away all cigarettes.   Clean and remove all ashtrays from your home, work, and car.  On a card, write down your reasons for quitting. Carry the card with you and read it when you get the urge to smoke.  Cleanse your body of nicotine. Drink enough water and fluids to keep your urine clear or pale yellow. Do this after quitting to flush the nicotine from your body.  Learn to predict your moods. Do not let a bad situation be your excuse to have a cigarette. Some situations in your life might tempt you into wanting a cigarette.  Never have "just one" cigarette. It leads to wanting another and another. Remind yourself of your decision to quit.  Change habits associated with smoking. If you smoked while driving or when feeling stressed, try other activities to replace smoking. Stand up when drinking your coffee. Brush your teeth after eating. Sit in a different chair when you read the paper. Avoid alcohol while trying to quit, and try to drink fewer caffeinated beverages. Alcohol and caffeine may urge you to  smoke.  Avoid foods and drinks that can trigger a desire to smoke, such as sugary or spicy foods and alcohol.  Ask people who smoke not to smoke around you.  Have something planned to do right after eating or having a cup of coffee. For example, plan to take a walk or exercise.  Try a relaxation exercise to calm you down and decrease your stress. Remember, you may be tense and nervous for the first 2 weeks after you quit, but this will pass.  Find new activities to keep your hands busy. Play with a pen, coin, or rubber band. Doodle or draw things on paper.  Brush your teeth right after eating. This will help cut down on the craving for the taste of tobacco after meals. You can also try mouthwash.   Use oral substitutes in place of cigarettes. Try using lemon drops, carrots, cinnamon sticks, or chewing gum. Keep them handy so they are available when you have the urge to smoke.  When you have the urge to smoke, try deep breathing.  Designate your home as a nonsmoking area.  If you are a heavy smoker, ask your health care provider about a prescription for nicotine chewing gum. It can ease your withdrawal from nicotine.  Reward yourself. Set aside the cigarette money you save and buy yourself something nice.  Look for support from others. Join a support group or smoking cessation program. Ask someone at home or at work to help you with your plan   to quit smoking.  Always ask yourself, "Do I need this cigarette or is this just a reflex?" Tell yourself, "Today, I choose not to smoke," or "I do not want to smoke." You are reminding yourself of your decision to quit.  Do not replace cigarette smoking with electronic cigarettes (commonly called e-cigarettes). The safety of e-cigarettes is unknown, and some may contain harmful chemicals.  If you relapse, do not give up! Plan ahead and think about what you will do the next time you get the urge to smoke. HOW WILL I FEEL WHEN I QUIT SMOKING? You  may have symptoms of withdrawal because your body is used to nicotine (the addictive substance in cigarettes). You may crave cigarettes, be irritable, feel very hungry, cough often, get headaches, or have difficulty concentrating. The withdrawal symptoms are only temporary. They are strongest when you first quit but will go away within 10-14 days. When withdrawal symptoms occur, stay in control. Think about your reasons for quitting. Remind yourself that these are signs that your body is healing and getting used to being without cigarettes. Remember that withdrawal symptoms are easier to treat than the major diseases that smoking can cause.  Even after the withdrawal is over, expect periodic urges to smoke. However, these cravings are generally short lived and will go away whether you smoke or not. Do not smoke! WHAT RESOURCES ARE AVAILABLE TO HELP ME QUIT SMOKING? Your health care provider can direct you to community resources or hospitals for support, which may include:  Group support.  Education.  Hypnosis.  Therapy.   This information is not intended to replace advice given to you by your health care provider. Make sure you discuss any questions you have with your health care provider.   Document Released: 05/29/2004 Document Revised: 09/21/2014 Document Reviewed: 02/16/2013 Elsevier Interactive Patient Education 2016 [employer]  

## 2015-12-09 NOTE — Addendum Note (Signed)
Addended by: Earnstine RegalKEELING, JOHN W on: 12/09/2015 09:50 PM   Modules accepted: Kipp BroodSmartSet

## 2016-01-02 ENCOUNTER — Other Ambulatory Visit: Payer: Self-pay | Admitting: "Endocrinology

## 2016-01-02 ENCOUNTER — Encounter: Payer: Self-pay | Admitting: Orthopaedic Surgery

## 2016-01-02 ENCOUNTER — Ambulatory Visit (INDEPENDENT_AMBULATORY_CARE_PROVIDER_SITE_OTHER): Admitting: Orthopaedic Surgery

## 2016-01-02 VITALS — BP 174/78 | HR 76 | Temp 97.7°F | Ht 67.0 in | Wt 133.0 lb

## 2016-01-02 DIAGNOSIS — M25512 Pain in left shoulder: Secondary | ICD-10-CM | POA: Diagnosis not present

## 2016-01-02 NOTE — Progress Notes (Signed)
Patient MV:HQIONG:Chase Briggs, male DOB:[birth year], 65 y.o. EXB:[id number]  Chief Complaint  Patient presents with  . Follow-up    left shoulder pain    HPI  Chase Briggs is a 65 y.o. male who has had left shoulder pain.  He is significantly improved. He has no pain, no difficulty.  He is doing his exercises.  He has no numbness.    HPI  Body mass index is 20.83 kg/(m^2).  Review of Systems  Constitutional:       He has diabetes He has hypertension He does not have COPD He smokes  HENT: Negative for congestion.   Respiratory: Negative for shortness of breath.   Cardiovascular: Negative for chest pain.  Endocrine: Positive for cold intolerance.  Musculoskeletal: Positive for myalgias and arthralgias.  All other systems reviewed and are negative.   Past Medical History  Diagnosis Date  . HTN (hypertension)   . Diabetes mellitus   . Hyperlipidemia   . Fatty liver   . GERD (gastroesophageal reflux disease)     Past Surgical History  Procedure Laterality Date  . Back surgery    . Colonoscopy  01/2007    Dr. Lovell SheehanJenkins, reviewed report, mild diverticulosis. Prep adequate. Next TCS due 01/2017  . Scalp mass  4-5 yrs ago    Dr Lovell SheehanJenkins  . Esophagogastroduodenoscopy N/A 10/21/2012    NUU:[id number]:MODERATE GASTRITIS  . Mass excision Right 10/26/2012    Procedure: EXCISION MASS;  Surgeon: Dalia HeadingMark A Jenkins, MD;  Location: [facility];  Service: General;  Laterality: Right;    Family History  Problem Relation Age of Onset  . Colon cancer Neg Hx   . Liver disease Neg Hx   . Diabetes Mother   . Diabetes Father   . Diabetes Brother     Social History Social History  Substance Use Topics  . Smoking status: Current Every Day Smoker -- 0.50 packs/day for 25 years    Types: Cigarettes  . Smokeless tobacco: None  . Alcohol Use: No     Comment: hx heavy etoh x 3 yrs, quit 30+ yrs ago    No Known Allergies  Current Outpatient Prescriptions  Medication Sig Dispense Refill  .  amLODipine (NORVASC) 10 MG tablet Take 1 tablet (10 mg total) by mouth daily. 90 tablet 0  . aspirin EC 81 MG tablet Take 81 mg by mouth daily.    Marland Kitchen. losartan (COZAAR) 100 MG tablet Take 100 mg by mouth daily.    . metFORMIN (GLUCOPHAGE) 500 MG tablet TAKE 1 TABLET TWICE A DAY 180 tablet 0  . omeprazole (PRILOSEC) 20 MG capsule Take 1 capsule (20 mg total) by mouth daily. 31 capsule 2  . simvastatin (ZOCOR) 20 MG tablet Take 20 mg by mouth every other day.     . Vitamin D, Ergocalciferol, (DRISDOL) 50000 UNITS CAPS capsule Take 1 capsule (50,000 Units total) by mouth every 7 (seven) days. 12 capsule 0  . cloNIDine (CATAPRES) 0.2 MG tablet TAKE 1 TABLET TWICE A DAY 180 tablet 0   No current facility-administered medications for this visit.     Physical Exam  Blood pressure 174/78, pulse 76, temperature 97.7 F (36.5 C), height 5\' 7"  (1.702 m), weight 133 lb (60.328 kg).  Constitutional: overall normal hygiene, normal nutrition, well developed, normal grooming, normal body habitus. Assistive device:none  Musculoskeletal: gait and station Limp none, muscle tone and strength are normal, no tremors or atrophy is present.  .  Neurological: coordination overall normal.  Deep tendon reflex/nerve  stretch intact.  Sensation normal.  Cranial nerves II-XII intact.   Skin:   normal overall no scars, lesions, ulcers or rashes. No psoriasis.  Psychiatric: Alert and oriented x 3.  Recent memory intact, remote memory unclear.  Normal mood and affect. Well groomed.  Good eye contact.  Cardiovascular: overall no swelling, no varicosities, no edema bilaterally, normal temperatures of the legs and arms, no clubbing, cyanosis and good capillary refill.  Lymphatic: palpation is normal.  Examination of left Upper Extremity is done.  Inspection:   Overall:  Elbow non-tender without crepitus or defects, forearm non-tender without crepitus or defects, wrist non-tender without crepitus or defects, hand  non-tender.    Shoulder: without glenohumeral joint tenderness, without effusion.   Upper arm: without swelling and tenderness   Range of motion:   Overall:  Full range of motion of the elbow, full range of motion of wrist and full range of motion in fingers.   Shoulder:  left  180 degrees forward flexion; 180 degrees abduction; 40 degrees internal rotation, 40 degrees external rotation, 30 degrees extension, 40 degrees adduction.   Stability:   Overall:  Shoulder, elbow and wrist stable   Strength and Tone:   Overall full shoulder muscles strength, full upper arm strength and normal upper arm bulk and tone.  The patient has been educated about the nature of the problem(s) and counseled on treatment options.  The patient appeared to understand what I have discussed and is in agreement with it.  Encounter Diagnosis  Name Primary?  . Left shoulder pain Yes    PLAN Call if any problems.  Precautions discussed.  Continue current medications.   Return to clinic as needed.

## 2016-02-19 ENCOUNTER — Other Ambulatory Visit: Payer: Self-pay | Admitting: "Endocrinology

## 2016-02-19 LAB — HEMOGLOBIN A1C
Hgb A1c MFr Bld: 6.8 % — ABNORMAL HIGH (ref <5.7)
MEAN PLASMA GLUCOSE: 148 mg/dL

## 2016-02-20 LAB — BASIC METABOLIC PANEL
BUN: 16 mg/dL (ref 7–25)
CHLORIDE: 104 mmol/L (ref 98–110)
CO2: 24 mmol/L (ref 20–31)
CREATININE: 1.31 mg/dL — AB (ref 0.70–1.25)
Calcium: 9.7 mg/dL (ref 8.6–10.3)
Glucose, Bld: 131 mg/dL — ABNORMAL HIGH (ref 65–99)
POTASSIUM: 4.3 mmol/L (ref 3.5–5.3)
Sodium: 139 mmol/L (ref 135–146)

## 2016-02-26 ENCOUNTER — Ambulatory Visit: Admitting: "Endocrinology

## 2016-03-02 ENCOUNTER — Encounter: Payer: Self-pay | Admitting: "Endocrinology

## 2016-03-02 ENCOUNTER — Ambulatory Visit (INDEPENDENT_AMBULATORY_CARE_PROVIDER_SITE_OTHER): Admitting: "Endocrinology

## 2016-03-02 VITALS — BP 152/77 | HR 66 | Ht 67.0 in | Wt 132.0 lb

## 2016-03-02 DIAGNOSIS — E119 Type 2 diabetes mellitus without complications: Secondary | ICD-10-CM

## 2016-03-02 DIAGNOSIS — I10 Essential (primary) hypertension: Secondary | ICD-10-CM

## 2016-03-02 DIAGNOSIS — E785 Hyperlipidemia, unspecified: Secondary | ICD-10-CM

## 2016-03-02 NOTE — Progress Notes (Signed)
Subjective:    Patient ID: Chase Briggs, male    DOB: February 16, 1951, PCP [facility]   Past Medical History  Diagnosis Date  . HTN (hypertension)   . Diabetes mellitus   . Hyperlipidemia   . Fatty liver   . GERD (gastroesophageal reflux disease)    Past Surgical History  Procedure Laterality Date  . Back surgery    . Colonoscopy  01/2007    Dr. [name], reviewed report, mild diverticulosis. Prep adequate. Next TCS due 01/2017  . Scalp mass  4-5 yrs ago    Dr [name]  . Esophagogastroduodenoscopy N/A 10/21/2012    JXB:[id number] GASTRITIS  . Mass excision Right 10/26/2012    Procedure: EXCISION MASS;  Surgeon: [name], MD;  Location: [facility];  Service: General;  Laterality: Right;   Social History   Social History  . Marital Status: Married    Spouse Name: N/A  . Number of Children: 1  . Years of Education: N/A   Occupational History  .      [employer], heavy lifting   Social History Main Topics  . Smoking status: Current Every Day Smoker -- 0.50 packs/day for 25 years    Types: Cigarettes  . Smokeless tobacco: None  . Alcohol Use: No     Comment: hx heavy etoh x 3 yrs, quit 30+ yrs ago  . Drug Use: No  . Sexual Activity: Yes    Birth Control/ Protection: None   Other Topics Concern  . None   Social History Narrative   Outpatient Encounter Prescriptions as of 03/02/2016  Medication Sig  . amLODipine (NORVASC) 10 MG tablet Take 1 tablet (10 mg total) by mouth daily.  Marland Kitchen aspirin EC 81 MG tablet Take 81 mg by mouth daily.  . cloNIDine (CATAPRES) 0.2 MG tablet TAKE 1 TABLET TWICE A DAY  . losartan (COZAAR) 100 MG tablet Take 100 mg by mouth daily.  . metFORMIN (GLUCOPHAGE) 500 MG tablet TAKE 1 TABLET TWICE A DAY  . omeprazole (PRILOSEC) 20 MG capsule Take 1 capsule (20 mg total) by mouth daily.  . simvastatin (ZOCOR) 20 MG tablet Take 20 mg by mouth every other day.   . Vitamin D, Ergocalciferol, (DRISDOL) 50000 UNITS CAPS capsule  Take 1 capsule (50,000 Units total) by mouth every 7 (seven) days.   No facility-administered encounter medications on file as of 03/02/2016.   ALLERGIES: No Known Allergies VACCINATION STATUS:  There is no immunization history on file for this patient.  Diabetes He presents for his follow-up diabetic visit. He has type 2 diabetes mellitus. Onset time: He was diagnosed at approximate age of 35 years. His disease course has been improving. There are no hypoglycemic associated symptoms. Pertinent negatives for hypoglycemia include no confusion, headaches, pallor or seizures. There are no diabetic associated symptoms. Pertinent negatives for diabetes include no chest pain, no fatigue, no polydipsia, no polyphagia, no polyuria and no weakness. There are no hypoglycemic complications. Symptoms are improving. Risk factors for coronary artery disease include diabetes mellitus, dyslipidemia, hypertension, male sex and sedentary lifestyle. He is compliant with treatment most of the time. His weight is stable. When asked about meal planning, he reported none. He has not had a previous visit with a dietitian. An ACE inhibitor/angiotensin II receptor blocker is being taken. Eye exam is current.  Hypertension This is a chronic problem. The current episode started more than 1 year ago. The problem is uncontrolled. Pertinent negatives include no chest  pain, headaches, neck pain, palpitations or shortness of breath. Risk factors for coronary artery disease include smoking/tobacco exposure. Past treatments include ACE inhibitors, calcium channel blockers and central alpha agonists. The current treatment provides no improvement. Compliance problems include psychosocial issues.   Hyperlipidemia This is a chronic problem. The current episode started more than 1 year ago. Pertinent negatives include no chest pain, myalgias or shortness of breath. Current antihyperlipidemic treatment includes statins. Risk factors for  coronary artery disease include dyslipidemia, diabetes mellitus, male sex, hypertension and a sedentary lifestyle.     Review of Systems  Constitutional: Negative for fatigue and unexpected weight change.  HENT: Negative for dental problem, mouth sores and trouble swallowing.   Eyes: Negative for visual disturbance.  Respiratory: Negative for cough, choking, chest tightness, shortness of breath and wheezing.   Cardiovascular: Negative for chest pain, palpitations and leg swelling.  Gastrointestinal: Negative for nausea, vomiting, abdominal pain, diarrhea, constipation and abdominal distention.  Endocrine: Negative for polydipsia, polyphagia and polyuria.  Genitourinary: Negative for dysuria, urgency, hematuria and flank pain.  Musculoskeletal: Negative for myalgias, back pain, gait problem and neck pain.  Skin: Negative for pallor, rash and wound.  Neurological: Negative for seizures, syncope, weakness, numbness and headaches.  Psychiatric/Behavioral: Negative.  Negative for confusion and dysphoric mood.    Objective:    BP 152/77 mmHg  Pulse 66  Ht  (1.702 m)  Wt 132 lb (59.875 kg)  BMI 20.67 kg/m2  SpO2 98%  Wt Readings from Last 3 Encounters:  03/02/16 132 lb (59.875 kg)  01/02/16 133 lb (60.328 kg)  12/05/15 133 lb (60.328 kg)    Physical Exam  Constitutional: He is oriented to person, place, and time. He appears well-developed and well-nourished. He is cooperative. No distress.  HENT:  Head: Normocephalic and atraumatic.  Eyes: EOM are normal.  Neck: Normal range of motion. Neck supple. No tracheal deviation present. No thyromegaly present.  Cardiovascular: Normal rate, S1 normal, S2 normal and normal heart sounds.  Exam reveals no gallop.   No murmur heard. Pulses:      Dorsalis pedis pulses are 1+ on the right side, and 1+ on the left side.       Posterior tibial pulses are 1+ on the right side, and 1+ on the left side.  Pulmonary/Chest: Breath sounds normal. No  respiratory distress. He has no wheezes.  Abdominal: Soft. Bowel sounds are normal. He exhibits no distension. There is no tenderness. There is no guarding and no CVA tenderness.  Musculoskeletal: He exhibits no edema.       Right shoulder: He exhibits no swelling and no deformity.  Neurological: He is alert and oriented to person, place, and time. He has normal strength and normal reflexes. No cranial nerve deficit or sensory deficit. Gait normal.  Skin: Skin is warm and dry. No rash noted. No cyanosis. Nails show no clubbing.  Psychiatric: He has a normal mood and affect. His speech is normal and behavior is normal. Judgment and thought content normal. Cognition and memory are normal.     Complete Blood Count (Most recent): Lab Results  Component Value Date   WBC 10.7* 02/21/2014   HGB 13.1 02/21/2014   HCT 37.0* 02/21/2014   MCV 89.6 02/21/2014   PLT 180 02/21/2014   Chemistry (most recent): Lab Results  Component Value Date   NA 139 02/19/2016   K 4.3 02/19/2016   CL 104 02/19/2016   CO2 24 02/19/2016   BUN 16 02/19/2016  CREATININE 1.31* 02/19/2016   Diabetic Labs (most recent): Lab Results  Component Value Date   HGBA1C 6.8* 02/19/2016   HGBA1C 6.9* 08/19/2015   HGBA1C 10.8* 02/20/2014    Assessment & Plan:   1. Type 2 diabetes mellitus without complication, without long-term current use of insulin (HCC)  -He had hyperosmolar state in the past, patient remains at a high risk for more acute and chronic complications of diabetes which include CAD, CVA, CKD, retinopathy, and neuropathy. These are all discussed in detail with the patient.  Patient came with controlled A1c of 6.8 %, Overall improving from 10.8%.   Recent labs reviewed.   - I have re-counseled the patient on diet management   by adopting a carbohydrate restricted / protein rich  Diet.  - Suggestion is made for patient to avoid simple carbohydrates   from their diet including Cakes , Desserts, Ice  Cream,  Soda (  diet and regular) , Sweet Tea , Candies,  Chips, Cookies, Artificial Sweeteners,   and "Sugar-free" Products .  This will help patient to have stable blood glucose profile and potentially avoid unintended  Weight gain.  - Patient is advised to stick to a routine mealtimes to eat 3 meals  a day and avoid unnecessary snacks ( to snack only to correct hypoglycemia).  - I have approached patient with the following individualized plan to manage diabetes and patient agrees.  - Continue low dose metformin 500mg  po qday. he will not need insulin therapy for now. -Patient is encouraged to call clinic for blood glucose levels less than 70 or above 300 mg /dl.  - Patient is not a candidate for  incretin therapy . - Patient specific target  for A1c; LDL, HDL, Triglycerides, and  Waist Circumference were discussed in detail.  2) BP/HTN:  uncontrolled due to the fact that he did not take his blood pressure medications this morning. Continue current medications including ACEI/ARB. 3) Lipids/HPL:  continue statins. 4)  Weight/Diet:  no weight problem and his case, exercise, and carbohydrates information provided. 5. Vitamin D deficiency -New diagnosis for him. I would initiate vitamin D 50,000 units weekly for the next 12 weeks.  6) Chronic Care/Health Maintenance:  -Patient is on ACEI/ARB and Statin medications and encouraged to continue to follow up with Ophthalmology, Podiatrist at least yearly or according to recommendations, and advised to  Quit  smoking. I have recommended yearly flu vaccine and pneumonia vaccination at least every 5 years; moderate intensity exercise for up to 150 minutes weekly; and  sleep for at least 7 hours a day.  - 25 minutes of time was spent on the care of this patient , 50% of which was applied for counseling on diabetes complications and their preventions.  - I advised patient to maintain close follow up with [facility] for primary  care needs.  Patient is asked to bring meter and  blood glucose logs during their next visit.   Follow up plan: -Return in about 6 months (around 09/01/2016) for diabetes, follow up with pre-visit labs.  Marquis LunchGebre Aviana Shevlin, MD Phone: [phone]  Fax: [phone]   03/02/2016, 1:57 PM

## 2016-04-01 ENCOUNTER — Other Ambulatory Visit: Payer: Self-pay | Admitting: "Endocrinology

## 2016-07-06 ENCOUNTER — Other Ambulatory Visit (HOSPITAL_COMMUNITY): Payer: Self-pay | Admitting: Internal Medicine

## 2016-07-06 ENCOUNTER — Ambulatory Visit (HOSPITAL_COMMUNITY)
Admission: RE | Admit: 2016-07-06 | Discharge: 2016-07-06 | Disposition: A | Discharge status: Home / Self Care | Payer: Medicare Other | Source: Ambulatory Visit | Attending: Internal Medicine | Admitting: Internal Medicine

## 2016-07-06 DIAGNOSIS — Z720 Tobacco use: Secondary | ICD-10-CM

## 2016-07-06 DIAGNOSIS — I10 Essential (primary) hypertension: Secondary | ICD-10-CM | POA: Insufficient documentation

## 2016-08-17 ENCOUNTER — Other Ambulatory Visit: Payer: Self-pay | Admitting: "Endocrinology

## 2016-08-26 ENCOUNTER — Other Ambulatory Visit: Payer: Self-pay | Admitting: "Endocrinology

## 2016-08-27 LAB — COMPLETE METABOLIC PANEL WITH GFR
ALT: 11 U/L (ref 9–46)
AST: 13 U/L (ref 10–35)
Albumin: 4.6 g/dL (ref 3.6–5.1)
Alkaline Phosphatase: 132 U/L — ABNORMAL HIGH (ref 40–115)
BUN: 13 mg/dL (ref 7–25)
CHLORIDE: 98 mmol/L (ref 98–110)
CO2: 28 mmol/L (ref 20–31)
Calcium: 10 mg/dL (ref 8.6–10.3)
Creat: 1.62 mg/dL — ABNORMAL HIGH (ref 0.70–1.25)
GFR, EST AFRICAN AMERICAN: 51 mL/min — AB (ref >=60)
GFR, Est Non African American: 44 mL/min — ABNORMAL LOW (ref >=60)
Glucose, Bld: 413 mg/dL — ABNORMAL HIGH (ref 65–99)
POTASSIUM: 4.5 mmol/L (ref 3.5–5.3)
Sodium: 136 mmol/L (ref 135–146)
Total Bilirubin: 0.6 mg/dL (ref 0.2–1.2)
Total Protein: 8 g/dL (ref 6.1–8.1)

## 2016-08-27 LAB — HEMOGLOBIN A1C
Hgb A1c MFr Bld: 12.6 % — ABNORMAL HIGH (ref <5.7)
Mean Plasma Glucose: 315 mg/dL

## 2016-09-03 ENCOUNTER — Ambulatory Visit (INDEPENDENT_AMBULATORY_CARE_PROVIDER_SITE_OTHER): Payer: Medicare Other | Admitting: "Endocrinology

## 2016-09-03 ENCOUNTER — Encounter: Payer: Self-pay | Admitting: "Endocrinology

## 2016-09-03 VITALS — BP 146/81 | HR 67 | Ht 67.0 in | Wt 121.0 lb

## 2016-09-03 DIAGNOSIS — I10 Essential (primary) hypertension: Secondary | ICD-10-CM

## 2016-09-03 DIAGNOSIS — E1122 Type 2 diabetes mellitus with diabetic chronic kidney disease: Secondary | ICD-10-CM | POA: Diagnosis not present

## 2016-09-03 DIAGNOSIS — N183 Chronic kidney disease, stage 3 (moderate): Secondary | ICD-10-CM | POA: Diagnosis not present

## 2016-09-03 DIAGNOSIS — E1165 Type 2 diabetes mellitus with hyperglycemia: Secondary | ICD-10-CM

## 2016-09-03 DIAGNOSIS — E782 Mixed hyperlipidemia: Secondary | ICD-10-CM

## 2016-09-03 DIAGNOSIS — IMO0002 Reserved for concepts with insufficient information to code with codable children: Secondary | ICD-10-CM

## 2016-09-03 NOTE — Patient Instructions (Signed)

## 2016-09-03 NOTE — Progress Notes (Signed)
Subjective:    Patient ID: Chase Briggs, male    DOB: March 11, 1951, PCP [facility]   Past Medical History:  Diagnosis Date  . Diabetes mellitus   . Fatty liver   . GERD (gastroesophageal reflux disease)   . HTN (hypertension)   . Hyperlipidemia    Past Surgical History:  Procedure Laterality Date  . BACK SURGERY    . COLONOSCOPY  01/2007   Dr. Lovell SheehanJenkins, reviewed report, mild diverticulosis. Prep adequate. Next TCS due 01/2017  . ESOPHAGOGASTRODUODENOSCOPY N/A 10/21/2012   [id number]:MODERATE GASTRITIS  . MASS EXCISION Right 10/26/2012   Procedure: EXCISION MASS;  Surgeon: Dalia HeadingMark A Jenkins, MD;  Location: [facility];  Service: General;  Laterality: Right;  . scalp mass  4-5 yrs ago   Dr Lovell SheehanJenkins   Social History   Social History  . Marital status: Married    Spouse name: N/A  . Number of children: 1  . Years of education: N/A   Occupational History  .      [employer], heavy lifting   Social History Main Topics  . Smoking status: Current Every Day Smoker    Packs/day: 0.50    Years: 25.00    Types: Cigarettes  . Smokeless tobacco: Never Used  . Alcohol use No     Comment: hx heavy etoh x 3 yrs, quit 30+ yrs ago  . Drug use: No  . Sexual activity: Yes    Birth control/ protection: None   Other Topics Concern  . None   Social History Narrative  . None   Outpatient Encounter Prescriptions as of 09/03/2016  Medication Sig  . amLODipine (NORVASC) 10 MG tablet Take 1 tablet (10 mg total) by mouth daily.  Marland Kitchen. aspirin EC 81 MG tablet Take 81 mg by mouth daily.  . cloNIDine (CATAPRES) 0.2 MG tablet TAKE 1 TABLET TWICE A DAY  . losartan (COZAAR) 100 MG tablet Take 100 mg by mouth daily.  . metFORMIN (GLUCOPHAGE) 500 MG tablet TAKE 1 TABLET TWICE A DAY  . omeprazole (PRILOSEC) 20 MG capsule Take 1 capsule (20 mg total) by mouth daily.  . simvastatin (ZOCOR) 20 MG tablet Take 20 mg by mouth every other day.   . Vitamin D, Ergocalciferol, (DRISDOL) 50000  UNITS CAPS capsule Take 1 capsule (50,000 Units total) by mouth every 7 (seven) days.   No facility-administered encounter medications on file as of 09/03/2016.    ALLERGIES: No Known Allergies VACCINATION STATUS:  There is no immunization history on file for this patient.  Diabetes  He presents for his follow-up diabetic visit. He has type 2 diabetes mellitus. Onset time: He was diagnosed at approximate age of 35 years. His disease course has been worsening. There are no hypoglycemic associated symptoms. Pertinent negatives for hypoglycemia include no confusion, headaches, pallor or seizures. There are no diabetic associated symptoms. Pertinent negatives for diabetes include no chest pain, no fatigue, no polydipsia, no polyphagia, no polyuria and no weakness. There are no hypoglycemic complications. Symptoms are worsening. Risk factors for coronary artery disease include diabetes mellitus, dyslipidemia, hypertension, male sex and sedentary lifestyle. He is compliant with treatment most of the time. His weight is decreasing steadily. He is following a generally unhealthy diet. When asked about meal planning, he reported none. He has not had a previous visit with a dietitian. An ACE inhibitor/angiotensin II receptor blocker is being taken. Eye exam is current.  Hypertension  This is a chronic problem. The current episode  started more than 1 year ago. The problem is uncontrolled. Pertinent negatives include no chest pain, headaches, neck pain, palpitations or shortness of breath. Risk factors for coronary artery disease include smoking/tobacco exposure. Past treatments include ACE inhibitors, calcium channel blockers and central alpha agonists. The current treatment provides no improvement. Compliance problems include psychosocial issues.   Hyperlipidemia  This is a chronic problem. The current episode started more than 1 year ago. Pertinent negatives include no chest pain, myalgias or shortness of  breath. Current antihyperlipidemic treatment includes statins. Risk factors for coronary artery disease include dyslipidemia, diabetes mellitus, male sex, hypertension and a sedentary lifestyle.     Review of Systems  Constitutional: Negative for fatigue and unexpected weight change.  HENT: Negative for dental problem, mouth sores and trouble swallowing.   Eyes: Negative for visual disturbance.  Respiratory: Negative for cough, choking, chest tightness, shortness of breath and wheezing.   Cardiovascular: Negative for chest pain, palpitations and leg swelling.  Gastrointestinal: Negative for abdominal distention, abdominal pain, constipation, diarrhea, nausea and vomiting.  Endocrine: Negative for polydipsia, polyphagia and polyuria.  Genitourinary: Negative for dysuria, flank pain, hematuria and urgency.  Musculoskeletal: Negative for back pain, gait problem, myalgias and neck pain.  Skin: Negative for pallor, rash and wound.  Neurological: Negative for seizures, syncope, weakness, numbness and headaches.  Psychiatric/Behavioral: Negative.  Negative for confusion and dysphoric mood.    Objective:    BP (!) 146/81   Pulse 67   Ht 5\' 7"  (1.702 m)   Wt 121 lb (54.9 kg)   BMI 18.95 kg/m   Wt Readings from Last 3 Encounters:  09/03/16 121 lb (54.9 kg)  03/02/16 132 lb (59.9 kg)  01/02/16 133 lb (60.3 kg)    Physical Exam  Constitutional: He is oriented to person, place, and time. He appears well-developed and well-nourished. He is cooperative. No distress.  HENT:  Head: Normocephalic and atraumatic.  Eyes: EOM are normal.  Neck: Normal range of motion. Neck supple. No tracheal deviation present. No thyromegaly present.  Cardiovascular: Normal rate, S1 normal, S2 normal and normal heart sounds.  Exam reveals no gallop.   No murmur heard. Pulses:      Dorsalis pedis pulses are 1+ on the right side, and 1+ on the left side.       Posterior tibial pulses are 1+ on the right side, and  1+ on the left side.  Pulmonary/Chest: Breath sounds normal. No respiratory distress. He has no wheezes.  Abdominal: Soft. Bowel sounds are normal. He exhibits no distension. There is no tenderness. There is no guarding and no CVA tenderness.  Musculoskeletal: He exhibits no edema.       Right shoulder: He exhibits no swelling and no deformity.  Neurological: He is alert and oriented to person, place, and time. He has normal strength and normal reflexes. No cranial nerve deficit or sensory deficit. Gait normal.  Skin: Skin is warm and dry. No rash noted. No cyanosis. Nails show no clubbing.  Psychiatric: He has a normal mood and affect. His speech is normal and behavior is normal. Judgment and thought content normal. Cognition and memory are normal.     Complete Blood Count (Most recent): Lab Results  Component Value Date   WBC 10.7 (H) 02/21/2014   HGB 13.1 02/21/2014   HCT 37.0 (L) 02/21/2014   MCV 89.6 02/21/2014   PLT 180 02/21/2014   Chemistry (most recent): Lab Results  Component Value Date   NA 136 08/26/2016  K 4.5 08/26/2016   CL 98 08/26/2016   CO2 28 08/26/2016   BUN 13 08/26/2016   CREATININE 1.62 (H) 08/26/2016   Diabetic Labs (most recent): Lab Results  Component Value Date   HGBA1C 12.6 (H) 08/26/2016   HGBA1C 6.8 (H) 02/19/2016   HGBA1C 6.9 (A) 08/19/2015    Assessment & Plan:   1. Type 2 diabetes mellitus without complication, without long-term current use of insulin (HCC)  -He had hyperosmolar state in the past, patient remains at a high risk for more acute and chronic complications of diabetes which include CAD, CVA, CKD, retinopathy, and neuropathy. These are all discussed in detail with the patient.  Patient came with loss of control with A1c of 12.6%, increasing from 6.8%.   Recent labs reviewed.   - I have re-counseled the patient on diet management   by adopting a carbohydrate restricted / protein rich  Diet.  - Suggestion is made for patient  to avoid simple carbohydrates   from their diet including Cakes , Desserts, Ice Cream,  Soda (  diet and regular) , Sweet Tea , Candies,  Chips, Cookies, Artificial Sweeteners,   and "Sugar-free" Products .  This will help patient to have stable blood glucose profile and potentially avoid unintended  Weight gain.  - Patient is advised to stick to a routine mealtimes to eat 3 meals  a day and avoid unnecessary snacks ( to snack only to correct hypoglycemia).  - I have approached patient with the following individualized plan to manage diabetes and patient agrees.  - He did not bring any meter nor logs to review today. He is likely to require insulin treatment, however he is hesitant today. -I urged him to start monitoring blood glucose before meals and at bedtime (  4 times a day) and return in one week with his meter and logs. - In the meantime, I will increase his metformin to 500 mg by mouth twice a day.  -Patient is encouraged to call clinic for blood glucose levels less than 70 or above 300 mg /dl.  - Patient is not a candidate for  incretin therapy , however he is an insulin candidate. - Patient specific target  for A1c; LDL, HDL, Triglycerides, and  Waist Circumference were discussed in detail.  2) BP/HTN:  uncontrolled , as usual due to the fact that he did not take his blood pressure medications this morning. Continue current medications including ACEI/ARB. He is a chronic heavy smoker. 3) Lipids/HPL:  continue statins. 4)  Weight/Diet: He has lost 10 pounds since last visit which is due to significant glycosuria , exercise, and carbohydrates information provided. He will benefit from weight gain as a result of insulin treatment, see above.  CDE will be initiated. 5. Vitamin D deficiency -New diagnosis for him. I would initiate vitamin D 50,000 units weekly for the next 12 weeks.  6) Chronic Care/Health Maintenance:  -Patient is on ACEI/ARB and Statin medications and encouraged to  continue to follow up with Ophthalmology, Podiatrist at least yearly or according to recommendations, and advised to  Quit  smoking. I have recommended yearly flu vaccine and pneumonia vaccination at least every 5 years; moderate intensity exercise for up to 150 minutes weekly; and  sleep for at least 7 hours a day.  - 25 minutes of time was spent on the care of this patient , 50% of which was applied for counseling on diabetes complications and their preventions.  - I advised patient to  maintain close follow up with [facility] for primary care needs.  Patient is asked to bring meter and  blood glucose logs during their next visit.   Follow up plan: -Return in about 1 week (around 09/10/2016) for follow up with meter and logs- no labs.  Marquis Lunch, MD Phone: [phone]  Fax: [phone]   09/03/2016, 8:44 AM

## 2016-09-15 ENCOUNTER — Telehealth: Payer: Self-pay | Admitting: "Endocrinology

## 2016-09-15 NOTE — Telephone Encounter (Signed)
PT scheduled to be seen this week

## 2016-09-15 NOTE — Telephone Encounter (Signed)
He needs to return to clinic ASAP with his meter and logs for insulin initiation.

## 2016-09-15 NOTE — Telephone Encounter (Signed)
Patient left a VM stating he has had high blood sugar readings.

## 2016-09-15 NOTE — Telephone Encounter (Signed)
Pt states he has had high BG readings.   Date Before breakfast Before lunch Before supper Bedtime  12/31 413  [phone]  1/1 432 [phone]  1/2 270 418            Pt taking: MTF 500 mg bid

## 2016-09-16 ENCOUNTER — Ambulatory Visit: Payer: Medicare Other | Admitting: "Endocrinology

## 2016-09-17 ENCOUNTER — Ambulatory Visit (INDEPENDENT_AMBULATORY_CARE_PROVIDER_SITE_OTHER): Payer: Medicare Other | Admitting: "Endocrinology

## 2016-09-17 ENCOUNTER — Encounter: Payer: Self-pay | Admitting: "Endocrinology

## 2016-09-17 VITALS — BP 138/71 | HR 108 | Ht 67.0 in | Wt 125.0 lb

## 2016-09-17 DIAGNOSIS — N183 Chronic kidney disease, stage 3 (moderate): Secondary | ICD-10-CM | POA: Diagnosis not present

## 2016-09-17 DIAGNOSIS — IMO0002 Reserved for concepts with insufficient information to code with codable children: Secondary | ICD-10-CM

## 2016-09-17 DIAGNOSIS — E1122 Type 2 diabetes mellitus with diabetic chronic kidney disease: Secondary | ICD-10-CM

## 2016-09-17 DIAGNOSIS — I10 Essential (primary) hypertension: Secondary | ICD-10-CM

## 2016-09-17 DIAGNOSIS — E1165 Type 2 diabetes mellitus with hyperglycemia: Secondary | ICD-10-CM

## 2016-09-17 DIAGNOSIS — E782 Mixed hyperlipidemia: Secondary | ICD-10-CM | POA: Diagnosis not present

## 2016-09-17 MED ORDER — INSULIN GLARGINE 300 UNIT/ML ~~LOC~~ SOPN: 15.0000 [IU] | PEN_INJECTOR | Freq: Every day | SUBCUTANEOUS | 0 refills | Status: DC

## 2016-09-17 NOTE — Progress Notes (Signed)
Subjective:    Patient ID: Chase Briggs, male    DOB: 1950-12-13, PCP [facility]   Past Medical History:  Diagnosis Date  . Diabetes mellitus   . Fatty liver   . GERD (gastroesophageal reflux disease)   . HTN (hypertension)   . Hyperlipidemia    Past Surgical History:  Procedure Laterality Date  . BACK SURGERY    . COLONOSCOPY  01/2007   Dr. [name], reviewed report, mild diverticulosis. Prep adequate. Next TCS due 01/2017  . ESOPHAGOGASTRODUODENOSCOPY N/A 10/21/2012   [id number] GASTRITIS  . MASS EXCISION Right 10/26/2012   Procedure: EXCISION MASS;  Surgeon: [name], MD;  Location: [facility];  Service: General;  Laterality: Right;  . scalp mass  4-5 yrs ago   Dr [name]   Social History   Social History  . Marital status: Married    Spouse name: N/A  . Number of children: 1  . Years of education: N/A   Occupational History  .      [employer], heavy lifting   Social History Main Topics  . Smoking status: Current Every Day Smoker    Packs/day: 0.50    Years: 25.00    Types: Cigarettes  . Smokeless tobacco: Never Used  . Alcohol use No     Comment: hx heavy etoh x 3 yrs, quit 30+ yrs ago  . Drug use: No  . Sexual activity: Yes    Birth control/ protection: None   Other Topics Concern  . None   Social History Narrative  . None   Outpatient Encounter Prescriptions as of 09/17/2016  Medication Sig  . amLODipine (NORVASC) 10 MG tablet Take 1 tablet (10 mg total) by mouth daily.  Marland Kitchen aspirin EC 81 MG tablet Take 81 mg by mouth daily.  . cloNIDine (CATAPRES) 0.2 MG tablet TAKE 1 TABLET TWICE A DAY  . Insulin Glargine (TOUJEO SOLOSTAR) 300 UNIT/ML SOPN Inject 15 Units into the skin at bedtime.  Marland Kitchen losartan (COZAAR) 100 MG tablet Take 100 mg by mouth daily.  . metFORMIN (GLUCOPHAGE) 500 MG tablet Take 500 mg by mouth 2 (two) times daily with a meal.  . omeprazole (PRILOSEC) 20 MG capsule Take 1 capsule (20 mg total) by mouth  daily.  . simvastatin (ZOCOR) 20 MG tablet Take 20 mg by mouth every other day.   . Vitamin D, Ergocalciferol, (DRISDOL) 50000 UNITS CAPS capsule Take 1 capsule (50,000 Units total) by mouth every 7 (seven) days.   No facility-administered encounter medications on file as of 09/17/2016.    ALLERGIES: No Known Allergies VACCINATION STATUS:  There is no immunization history on file for this patient.  Diabetes  He presents for his follow-up diabetic visit. He has type 2 diabetes mellitus. Onset time: He was diagnosed at approximate age of 35 years. His disease course has been worsening. There are no hypoglycemic associated symptoms. Pertinent negatives for hypoglycemia include no confusion, headaches, pallor or seizures. There are no diabetic associated symptoms. Pertinent negatives for diabetes include no chest pain, no fatigue, no polydipsia, no polyphagia, no polyuria and no weakness. There are no hypoglycemic complications. Symptoms are worsening. Risk factors for coronary artery disease include diabetes mellitus, dyslipidemia, hypertension, male sex and sedentary lifestyle. He is compliant with treatment most of the time. His weight is decreasing steadily. He is following a generally unhealthy diet. When asked about meal planning, he reported none. He has not had a previous visit with a dietitian. His  breakfast blood glucose range is generally >200 mg/dl. His lunch blood glucose range is generally >200 mg/dl. His dinner blood glucose range is generally >200 mg/dl. His overall blood glucose range is >200 mg/dl. An ACE inhibitor/angiotensin II receptor blocker is being taken. Eye exam is current.  Hypertension  This is a chronic problem. The current episode started more than 1 year ago. The problem is uncontrolled. Pertinent negatives include no chest pain, headaches, neck pain, palpitations or shortness of breath. Risk factors for coronary artery disease include smoking/tobacco exposure. Past  treatments include ACE inhibitors, calcium channel blockers and central alpha agonists. The current treatment provides no improvement. Compliance problems include psychosocial issues.   Hyperlipidemia  This is a chronic problem. The current episode started more than 1 year ago. Pertinent negatives include no chest pain, myalgias or shortness of breath. Current antihyperlipidemic treatment includes statins. Risk factors for coronary artery disease include dyslipidemia, diabetes mellitus, male sex, hypertension and a sedentary lifestyle.     Review of Systems  Constitutional: Negative for fatigue and unexpected weight change.  HENT: Negative for dental problem, mouth sores and trouble swallowing.   Eyes: Negative for visual disturbance.  Respiratory: Negative for cough, choking, chest tightness, shortness of breath and wheezing.   Cardiovascular: Negative for chest pain, palpitations and leg swelling.  Gastrointestinal: Negative for abdominal distention, abdominal pain, constipation, diarrhea, nausea and vomiting.  Endocrine: Negative for polydipsia, polyphagia and polyuria.  Genitourinary: Negative for dysuria, flank pain, hematuria and urgency.  Musculoskeletal: Negative for back pain, gait problem, myalgias and neck pain.  Skin: Negative for pallor, rash and wound.  Neurological: Negative for seizures, syncope, weakness, numbness and headaches.  Psychiatric/Behavioral: Negative.  Negative for confusion and dysphoric mood.    Objective:    BP 138/71   Pulse (!) 108   Ht 5\' 7"  (1.702 m)   Wt 125 lb (56.7 kg)   BMI 19.58 kg/m   Wt Readings from Last 3 Encounters:  09/17/16 125 lb (56.7 kg)  09/03/16 121 lb (54.9 kg)  03/02/16 132 lb (59.9 kg)    Physical Exam  Constitutional: He is oriented to person, place, and time. He appears well-developed and well-nourished. He is cooperative. No distress.  HENT:  Head: Normocephalic and atraumatic.  Eyes: EOM are normal.  Neck: Normal range  of motion. Neck supple. No tracheal deviation present. No thyromegaly present.  Cardiovascular: Normal rate, S1 normal, S2 normal and normal heart sounds.  Exam reveals no gallop.   No murmur heard. Pulses:      Dorsalis pedis pulses are 1+ on the right side, and 1+ on the left side.       Posterior tibial pulses are 1+ on the right side, and 1+ on the left side.  Pulmonary/Chest: Breath sounds normal. No respiratory distress. He has no wheezes.  Abdominal: Soft. Bowel sounds are normal. He exhibits no distension. There is no tenderness. There is no guarding and no CVA tenderness.  Musculoskeletal: He exhibits no edema.       Right shoulder: He exhibits no swelling and no deformity.  Neurological: He is alert and oriented to person, place, and time. He has normal strength and normal reflexes. No cranial nerve deficit or sensory deficit. Gait normal.  Skin: Skin is warm and dry. No rash noted. No cyanosis. Nails show no clubbing.  Psychiatric: He has a normal mood and affect. His speech is normal and behavior is normal. Judgment and thought content normal. Cognition and memory are normal.  Complete Blood Count (Most recent): Lab Results  Component Value Date   WBC 10.7 (H) 02/21/2014   HGB 13.1 02/21/2014   HCT 37.0 (L) 02/21/2014   MCV 89.6 02/21/2014   PLT 180 02/21/2014   Chemistry (most recent): Lab Results  Component Value Date   NA 136 08/26/2016   K 4.5 08/26/2016   CL 98 08/26/2016   CO2 28 08/26/2016   BUN 13 08/26/2016   CREATININE 1.62 (H) 08/26/2016   Diabetic Labs (most recent): Lab Results  Component Value Date   HGBA1C 12.6 (H) 08/26/2016   HGBA1C 6.8 (H) 02/19/2016   HGBA1C 6.9 (A) 08/19/2015    Assessment & Plan:   1. Type 2 diabetes mellitus without complication, without long-term current use of insulin (HCC)  -He had hyperosmolar state in the past, patient remains at a high risk for more acute and chronic complications of diabetes which include CAD,  CVA, CKD, retinopathy, and neuropathy. These are all discussed in detail with the patient.  Patient came with loss of control with A1c of 12.6%, increasing from 6.8%.   Recent labs reviewed.   - I have re-counseled the patient on diet management   by adopting a carbohydrate restricted / protein rich  Diet.  - Suggestion is made for patient to avoid simple carbohydrates   from their diet including Cakes , Desserts, Ice Cream,  Soda (  diet and regular) , Sweet Tea , Candies,  Chips, Cookies, Artificial Sweeteners,   and "Sugar-free" Products .  This will help patient to have stable blood glucose profile and potentially avoid unintended  Weight gain.  - Patient is advised to stick to a routine mealtimes to eat 3 meals  a day and avoid unnecessary snacks ( to snack only to correct hypoglycemia).  - I have approached patient with the following individualized plan to manage diabetes and patient agrees.  - He has significant glycemic burden on this time. - He is approached for insulin treatment, to start with basal insulin. He agrees.  -I will initiate 15 units of Toujeo daily at bedtime, strict  monitoring blood glucose before meals and at bedtime (  4 times a day) and return in one week with his meter and logs. -I will continue  metformin to 500 mg by mouth twice a day.  -Patient is encouraged to call clinic for blood glucose levels less than 70 or above 300 mg /dl.  - Patient is not a candidate for  incretin therapy , however he is an insulin candidate. - Patient specific target  for A1c; LDL, HDL, Triglycerides, and  Waist Circumference were discussed in detail.  2) BP/HTN:  uncontrolled , as usual due to the fact that he did not take his blood pressure medications this morning. Continue current medications including ACEI/ARB. He is a chronic heavy smoker. 3) Lipids/HPL:  continue statins. 4)  Weight/Diet: He has lost 10 pounds since last visit which is due to significant glycosuria ,  exercise, and carbohydrates information provided. He will benefit from weight gain as a result of insulin treatment, see above.  CDE will be initiated. 5. Vitamin D deficiency -New diagnosis for him. I would initiate vitamin D 50,000 units weekly for the next 12 weeks.  6) Chronic Care/Health Maintenance:  -Patient is on ACEI/ARB and Statin medications and encouraged to continue to follow up with Ophthalmology, Podiatrist at least yearly or according to recommendations, and advised to  Quit  smoking. I have recommended yearly flu vaccine and pneumonia  vaccination at least every 5 years; moderate intensity exercise for up to 150 minutes weekly; and  sleep for at least 7 hours a day.  - 25 minutes of time was spent on the care of this patient , 50% of which was applied for counseling on diabetes complications and their preventions.  - I advised patient to maintain close follow up with [facility] for primary care needs.  Patient is asked to bring meter and  blood glucose logs during their next visit.   Follow up plan: -Return in about 1 week (around 09/24/2016) for follow up with meter and logs- no labs.  Marquis Lunch, MD Phone: [phone]  Fax: [phone]   09/17/2016, 2:24 PM

## 2016-09-25 DIAGNOSIS — Z720 Tobacco use: Secondary | ICD-10-CM | POA: Diagnosis not present

## 2016-09-25 DIAGNOSIS — I739 Peripheral vascular disease, unspecified: Secondary | ICD-10-CM | POA: Diagnosis not present

## 2016-09-25 DIAGNOSIS — E119 Type 2 diabetes mellitus without complications: Secondary | ICD-10-CM | POA: Diagnosis not present

## 2016-09-25 DIAGNOSIS — R252 Cramp and spasm: Secondary | ICD-10-CM | POA: Diagnosis not present

## 2016-09-25 DIAGNOSIS — I2510 Atherosclerotic heart disease of native coronary artery without angina pectoris: Secondary | ICD-10-CM | POA: Diagnosis not present

## 2016-09-29 ENCOUNTER — Ambulatory Visit: Payer: Medicare Other | Admitting: "Endocrinology

## 2016-09-29 ENCOUNTER — Encounter: Payer: Self-pay | Admitting: "Endocrinology

## 2016-09-29 ENCOUNTER — Ambulatory Visit (INDEPENDENT_AMBULATORY_CARE_PROVIDER_SITE_OTHER): Payer: Medicare Other | Admitting: "Endocrinology

## 2016-09-29 VITALS — BP 147/76 | HR 80 | Ht 67.0 in | Wt 129.0 lb

## 2016-09-29 DIAGNOSIS — E1122 Type 2 diabetes mellitus with diabetic chronic kidney disease: Secondary | ICD-10-CM

## 2016-09-29 DIAGNOSIS — E1165 Type 2 diabetes mellitus with hyperglycemia: Secondary | ICD-10-CM | POA: Diagnosis not present

## 2016-09-29 DIAGNOSIS — N183 Chronic kidney disease, stage 3 (moderate): Secondary | ICD-10-CM

## 2016-09-29 DIAGNOSIS — I10 Essential (primary) hypertension: Secondary | ICD-10-CM | POA: Diagnosis not present

## 2016-09-29 DIAGNOSIS — E782 Mixed hyperlipidemia: Secondary | ICD-10-CM

## 2016-09-29 DIAGNOSIS — IMO0002 Reserved for concepts with insufficient information to code with codable children: Secondary | ICD-10-CM

## 2016-09-29 MED ORDER — INSULIN GLARGINE 300 UNIT/ML ~~LOC~~ SOPN: 15.0000 [IU] | PEN_INJECTOR | Freq: Every day | SUBCUTANEOUS | 0 refills | Status: DC

## 2016-09-29 NOTE — Progress Notes (Signed)
Subjective:    Patient ID: Chase Briggs, male    DOB: 1951-04-26, PCP [facility]   Past Medical History:  Diagnosis Date  . Diabetes mellitus   . Fatty liver   . GERD (gastroesophageal reflux disease)   . HTN (hypertension)   . Hyperlipidemia    Past Surgical History:  Procedure Laterality Date  . BACK SURGERY    . COLONOSCOPY  01/2007   Dr. [name], reviewed report, mild diverticulosis. Prep adequate. Next TCS due 01/2017  . ESOPHAGOGASTRODUODENOSCOPY N/A 10/21/2012   [id number] GASTRITIS  . MASS EXCISION Right 10/26/2012   Procedure: EXCISION MASS;  Surgeon: [name], MD;  Location: [facility];  Service: General;  Laterality: Right;  . scalp mass  4-5 yrs ago   Dr [name]   Social History   Social History  . Marital status: Married    Spouse name: N/A  . Number of children: 1  . Years of education: N/A   Occupational History  .      [employer], heavy lifting   Social History Main Topics  . Smoking status: Current Every Day Smoker    Packs/day: 0.50    Years: 25.00    Types: Cigarettes  . Smokeless tobacco: Never Used  . Alcohol use No     Comment: hx heavy etoh x 3 yrs, quit 30+ yrs ago  . Drug use: No  . Sexual activity: Yes    Birth control/ protection: None   Other Topics Concern  . None   Social History Narrative  . None   Outpatient Encounter Prescriptions as of 09/29/2016  Medication Sig  . amLODipine (NORVASC) 10 MG tablet Take 1 tablet (10 mg total) by mouth daily.  Marland Kitchen aspirin EC 81 MG tablet Take 81 mg by mouth daily.  . cloNIDine (CATAPRES) 0.2 MG tablet TAKE 1 TABLET TWICE A DAY  . Insulin Glargine (TOUJEO SOLOSTAR) 300 UNIT/ML SOPN Inject 15 Units into the skin daily with breakfast.  . losartan (COZAAR) 100 MG tablet Take 100 mg by mouth daily.  . metFORMIN (GLUCOPHAGE) 500 MG tablet Take 500 mg by mouth 2 (two) times daily with a meal.  . omeprazole (PRILOSEC) 20 MG capsule Take 1 capsule (20 mg total) by  mouth daily.  . simvastatin (ZOCOR) 20 MG tablet Take 20 mg by mouth every other day.   . Vitamin D, Ergocalciferol, (DRISDOL) 50000 UNITS CAPS capsule Take 1 capsule (50,000 Units total) by mouth every 7 (seven) days.  . [DISCONTINUED] Insulin Glargine (TOUJEO SOLOSTAR) 300 UNIT/ML SOPN Inject 15 Units into the skin at bedtime.   No facility-administered encounter medications on file as of 09/29/2016.    ALLERGIES: No Known Allergies VACCINATION STATUS:  There is no immunization history on file for this patient.  Diabetes  He presents for his follow-up diabetic visit. He has type 2 diabetes mellitus. Onset time: He was diagnosed at approximate age of 35 years. His disease course has been improving. There are no hypoglycemic associated symptoms. Pertinent negatives for hypoglycemia include no confusion, headaches, pallor or seizures. There are no diabetic associated symptoms. Pertinent negatives for diabetes include no chest pain, no fatigue, no polydipsia, no polyphagia, no polyuria and no weakness. There are no hypoglycemic complications. Symptoms are improving. Risk factors for coronary artery disease include diabetes mellitus, dyslipidemia, hypertension, male sex and sedentary lifestyle. He is compliant with treatment most of the time. His weight is increasing steadily. He is following a generally unhealthy diet.  When asked about meal planning, he reported none. He has not had a previous visit with a dietitian. His breakfast blood glucose range is generally 130-140 mg/dl. His lunch blood glucose range is generally 180-200 mg/dl. His dinner blood glucose range is generally 140-180 mg/dl. His overall blood glucose range is 180-200 mg/dl. An ACE inhibitor/angiotensin II receptor blocker is being taken. Eye exam is current.  Hypertension  This is a chronic problem. The current episode started more than 1 year ago. The problem is uncontrolled. Pertinent negatives include no chest pain, headaches, neck  pain, palpitations or shortness of breath. Risk factors for coronary artery disease include smoking/tobacco exposure. Past treatments include ACE inhibitors, calcium channel blockers and central alpha agonists. The current treatment provides no improvement. Compliance problems include psychosocial issues.   Hyperlipidemia  This is a chronic problem. The current episode started more than 1 year ago. Pertinent negatives include no chest pain, myalgias or shortness of breath. Current antihyperlipidemic treatment includes statins. Risk factors for coronary artery disease include dyslipidemia, diabetes mellitus, male sex, hypertension and a sedentary lifestyle.     Review of Systems  Constitutional: Negative for fatigue and unexpected weight change.  HENT: Negative for dental problem, mouth sores and trouble swallowing.   Eyes: Negative for visual disturbance.  Respiratory: Negative for cough, choking, chest tightness, shortness of breath and wheezing.   Cardiovascular: Negative for chest pain, palpitations and leg swelling.  Gastrointestinal: Negative for abdominal distention, abdominal pain, constipation, diarrhea, nausea and vomiting.  Endocrine: Negative for polydipsia, polyphagia and polyuria.  Genitourinary: Negative for dysuria, flank pain, hematuria and urgency.  Musculoskeletal: Negative for back pain, gait problem, myalgias and neck pain.  Skin: Negative for pallor, rash and wound.  Neurological: Negative for seizures, syncope, weakness, numbness and headaches.  Psychiatric/Behavioral: Negative.  Negative for confusion and dysphoric mood.    Objective:    BP (!) 147/76   Pulse 80   Ht 5\' 7"  (1.702 m)   Wt 129 lb (58.5 kg)   BMI 20.20 kg/m   Wt Readings from Last 3 Encounters:  09/29/16 129 lb (58.5 kg)  09/17/16 125 lb (56.7 kg)  09/03/16 121 lb (54.9 kg)    Physical Exam  Constitutional: He is oriented to person, place, and time. He appears well-developed and well-nourished.  He is cooperative. No distress.  HENT:  Head: Normocephalic and atraumatic.  Eyes: EOM are normal.  Neck: Normal range of motion. Neck supple. No tracheal deviation present. No thyromegaly present.  Cardiovascular: Normal rate, S1 normal, S2 normal and normal heart sounds.  Exam reveals no gallop.   No murmur heard. Pulses:      Dorsalis pedis pulses are 1+ on the right side, and 1+ on the left side.       Posterior tibial pulses are 1+ on the right side, and 1+ on the left side.  Pulmonary/Chest: Breath sounds normal. No respiratory distress. He has no wheezes.  Abdominal: Soft. Bowel sounds are normal. He exhibits no distension. There is no tenderness. There is no guarding and no CVA tenderness.  Musculoskeletal: He exhibits no edema.       Right shoulder: He exhibits no swelling and no deformity.  Neurological: He is alert and oriented to person, place, and time. He has normal strength and normal reflexes. No cranial nerve deficit or sensory deficit. Gait normal.  Skin: Skin is warm and dry. No rash noted. No cyanosis. Nails show no clubbing.  Psychiatric: He has a normal mood and affect. His  speech is normal and behavior is normal. Judgment and thought content normal. Cognition and memory are normal.     Complete Blood Count (Most recent): Lab Results  Component Value Date   WBC 10.7 (H) 02/21/2014   HGB 13.1 02/21/2014   HCT 37.0 (L) 02/21/2014   MCV 89.6 02/21/2014   PLT 180 02/21/2014   Chemistry (most recent): Lab Results  Component Value Date   NA 136 08/26/2016   K 4.5 08/26/2016   CL 98 08/26/2016   CO2 28 08/26/2016   BUN 13 08/26/2016   CREATININE 1.62 (H) 08/26/2016   Diabetic Labs (most recent): Lab Results  Component Value Date   HGBA1C 12.6 (H) 08/26/2016   HGBA1C 6.8 (H) 02/19/2016   HGBA1C 6.9 (A) 08/19/2015    Assessment & Plan:   1. Type 2 diabetes mellitus without complication, without long-term current use of insulin (HCC)  -He had  hyperosmolar state in the past, patient remains at a high risk for more acute and chronic complications of diabetes which include CAD, CVA, CKD, retinopathy, and neuropathy. These are all discussed in detail with the patient.  Patient came with loss of control with A1c of 12.6%, increasing from 6.8%.   Recent labs reviewed.   - I have re-counseled the patient on diet management   by adopting a carbohydrate restricted / protein rich  Diet.  - Suggestion is made for patient to avoid simple carbohydrates   from their diet including Cakes , Desserts, Ice Cream,  Soda (  diet and regular) , Sweet Tea , Candies,  Chips, Cookies, Artificial Sweeteners,   and "Sugar-free" Products .  This will help patient to have stable blood glucose profile and potentially avoid unintended  Weight gain.  - Patient is advised to stick to a routine mealtimes to eat 3 meals  a day and avoid unnecessary snacks ( to snack only to correct hypoglycemia).  - I have approached patient with the following individualized plan to manage diabetes and patient agrees.  - He has benefited from insulin initiation.   -I will switch Toujeo 15 units to AM with breakfast , continue strict  monitoring blood glucose daily  before breakfast and at bedtime and PRN . - I  will continue  metformin to 500 mg by mouth twice a day.  -Patient is encouraged to call clinic for blood glucose levels less than 70 or above 300 mg /dl.  - Patient is not a candidate for  incretin therapy , however he is an insulin candidate. - Patient specific target  for A1c; LDL, HDL, Triglycerides, and  Waist Circumference were discussed in detail.  2) BP/HTN:  uncontrolled , as usual due to the fact that he did not take his blood pressure medications this morning. Continue current medications including ACEI/ARB. He is a chronic heavy smoker. 3) Lipids/HPL:  continue statins. 4)  Weight/Diet: He has regained 8 lbs after he lost  10 pounds. exercise, and  carbohydrates information provided. CDE consult is requested. 5. Vitamin D deficiency -New diagnosis for him. I would initiate vitamin D 50,000 units weekly for the next 12 weeks.  6) Chronic Care/Health Maintenance:  -Patient is on ACEI/ARB and Statin medications and encouraged to continue to follow up with Ophthalmology, Podiatrist at least yearly or according to recommendations, and advised to  Quit  smoking. I have recommended yearly flu vaccine and pneumonia vaccination at least every 5 years; moderate intensity exercise for up to 150 minutes weekly; and  sleep for at  least 7 hours a day.  - 25 minutes of time was spent on the care of this patient , 50% of which was applied for counseling on diabetes complications and their preventions.  - I advised patient to maintain close follow up with [facility] for primary care needs.  Patient is asked to bring meter and  blood glucose logs during their next visit.   Follow up plan: -Return in about 8 weeks (around 11/24/2016) for meter, and logs.  Chase LunchGebre Marilou Barnfield, MD Phone: [phone]  Fax: [phone]   09/29/2016, 3:40 PM

## 2016-11-16 ENCOUNTER — Other Ambulatory Visit: Payer: Self-pay | Admitting: "Endocrinology

## 2016-11-16 DIAGNOSIS — E1122 Type 2 diabetes mellitus with diabetic chronic kidney disease: Secondary | ICD-10-CM | POA: Diagnosis not present

## 2016-11-16 DIAGNOSIS — E1165 Type 2 diabetes mellitus with hyperglycemia: Secondary | ICD-10-CM | POA: Diagnosis not present

## 2016-11-16 DIAGNOSIS — N183 Chronic kidney disease, stage 3 (moderate): Secondary | ICD-10-CM | POA: Diagnosis not present

## 2016-11-16 LAB — HEMOGLOBIN A1C
HEMOGLOBIN A1C: 6 % — AB (ref <5.7)
Mean Plasma Glucose: 126 mg/dL

## 2016-11-16 LAB — COMPREHENSIVE METABOLIC PANEL
ALK PHOS: 66 U/L (ref 40–115)
ALT: 16 U/L (ref 9–46)
AST: 19 U/L (ref 10–35)
Albumin: 4.2 g/dL (ref 3.6–5.1)
BILIRUBIN TOTAL: 0.5 mg/dL (ref 0.2–1.2)
BUN: 18 mg/dL (ref 7–25)
CO2: 29 mmol/L (ref 20–31)
CREATININE: 1.39 mg/dL — AB (ref 0.70–1.25)
Calcium: 9.6 mg/dL (ref 8.6–10.3)
Chloride: 109 mmol/L (ref 98–110)
Glucose, Bld: 127 mg/dL — ABNORMAL HIGH (ref 65–99)
Potassium: 4.5 mmol/L (ref 3.5–5.3)
SODIUM: 141 mmol/L (ref 135–146)
TOTAL PROTEIN: 7.3 g/dL (ref 6.1–8.1)

## 2016-11-24 ENCOUNTER — Encounter: Payer: Self-pay | Admitting: "Endocrinology

## 2016-11-24 ENCOUNTER — Ambulatory Visit: Payer: Medicare Other | Admitting: "Endocrinology

## 2016-11-24 ENCOUNTER — Ambulatory Visit (INDEPENDENT_AMBULATORY_CARE_PROVIDER_SITE_OTHER): Payer: Medicare Other | Admitting: "Endocrinology

## 2016-11-24 VITALS — BP 151/81 | HR 83 | Ht 67.0 in | Wt 131.0 lb

## 2016-11-24 DIAGNOSIS — I10 Essential (primary) hypertension: Secondary | ICD-10-CM | POA: Diagnosis not present

## 2016-11-24 DIAGNOSIS — IMO0002 Reserved for concepts with insufficient information to code with codable children: Secondary | ICD-10-CM

## 2016-11-24 DIAGNOSIS — E1122 Type 2 diabetes mellitus with diabetic chronic kidney disease: Secondary | ICD-10-CM | POA: Diagnosis not present

## 2016-11-24 DIAGNOSIS — E1165 Type 2 diabetes mellitus with hyperglycemia: Secondary | ICD-10-CM | POA: Diagnosis not present

## 2016-11-24 DIAGNOSIS — E782 Mixed hyperlipidemia: Secondary | ICD-10-CM | POA: Diagnosis not present

## 2016-11-24 DIAGNOSIS — N183 Chronic kidney disease, stage 3 (moderate): Secondary | ICD-10-CM

## 2016-11-24 DIAGNOSIS — E559 Vitamin D deficiency, unspecified: Secondary | ICD-10-CM

## 2016-11-24 MED ORDER — VITAMIN D3 125 MCG (5000 UT) PO CAPS: 5000.0000 [IU] | ORAL_CAPSULE | Freq: Every day | ORAL | 0 refills | Status: DC

## 2016-11-24 NOTE — Progress Notes (Signed)
Subjective:    Patient ID: Chase Briggs, male    DOB: 03-27-1951, PCP [facility]   Past Medical History:  Diagnosis Date  . Diabetes mellitus   . Fatty liver   . GERD (gastroesophageal reflux disease)   . HTN (hypertension)   . Hyperlipidemia    Past Surgical History:  Procedure Laterality Date  . BACK SURGERY    . COLONOSCOPY  01/2007   Dr. [name], reviewed report, mild diverticulosis. Prep adequate. Next TCS due 01/2017  . ESOPHAGOGASTRODUODENOSCOPY N/A 10/21/2012   [id number] GASTRITIS  . MASS EXCISION Right 10/26/2012   Procedure: EXCISION MASS;  Surgeon: [name], MD;  Location: [facility];  Service: General;  Laterality: Right;  . scalp mass  4-5 yrs ago   Dr [name]   Social History   Social History  . Marital status: Married    Spouse name: N/A  . Number of children: 1  . Years of education: N/A   Occupational History  .      [employer], heavy lifting   Social History Main Topics  . Smoking status: Current Every Day Smoker    Packs/day: 0.50    Years: 25.00    Types: Cigarettes  . Smokeless tobacco: Never Used  . Alcohol use No     Comment: hx heavy etoh x 3 yrs, quit 30+ yrs ago  . Drug use: No  . Sexual activity: Yes    Birth control/ protection: None   Other Topics Concern  . None   Social History Narrative  . None   Outpatient Encounter Prescriptions as of 11/24/2016  Medication Sig  . amLODipine (NORVASC) 10 MG tablet Take 1 tablet (10 mg total) by mouth daily.  Marland Kitchen aspirin EC 81 MG tablet Take 81 mg by mouth daily.  . Cholecalciferol (VITAMIN D3) 5000 units CAPS Take 1 capsule (5,000 Units total) by mouth daily.  . cloNIDine (CATAPRES) 0.2 MG tablet TAKE 1 TABLET TWICE A DAY  . Insulin Glargine (TOUJEO SOLOSTAR) 300 UNIT/ML SOPN Inject 15 Units into the skin daily with breakfast.  . losartan (COZAAR) 100 MG tablet Take 100 mg by mouth daily.  . metFORMIN (GLUCOPHAGE) 500 MG tablet Take 500 mg by mouth 2  (two) times daily with a meal.  . omeprazole (PRILOSEC) 20 MG capsule Take 1 capsule (20 mg total) by mouth daily.  . simvastatin (ZOCOR) 20 MG tablet Take 20 mg by mouth every other day.   . [DISCONTINUED] Vitamin D, Ergocalciferol, (DRISDOL) 50000 UNITS CAPS capsule Take 1 capsule (50,000 Units total) by mouth every 7 (seven) days.   No facility-administered encounter medications on file as of 11/24/2016.    ALLERGIES: No Known Allergies VACCINATION STATUS:  There is no immunization history on file for this patient.  Diabetes  He presents for his follow-up diabetic visit. He has type 2 diabetes mellitus. Onset time: He was diagnosed at approximate age of 35 years. His disease course has been improving. There are no hypoglycemic associated symptoms. Pertinent negatives for hypoglycemia include no confusion, headaches, pallor or seizures. There are no diabetic associated symptoms. Pertinent negatives for diabetes include no chest pain, no fatigue, no polydipsia, no polyphagia, no polyuria and no weakness. There are no hypoglycemic complications. Symptoms are improving. Risk factors for coronary artery disease include diabetes mellitus, dyslipidemia, hypertension, male sex and sedentary lifestyle. He is compliant with treatment most of the time. His weight is increasing steadily. He is following a generally unhealthy diet.  When asked about meal planning, he reported none. He has not had a previous visit with a dietitian. His breakfast blood glucose range is generally 130-140 mg/dl. His lunch blood glucose range is generally 180-200 mg/dl. His dinner blood glucose range is generally 140-180 mg/dl. His overall blood glucose range is 180-200 mg/dl. An ACE inhibitor/angiotensin II receptor blocker is being taken. Eye exam is current.  Hypertension  This is a chronic problem. The current episode started more than 1 year ago. The problem is uncontrolled. Pertinent negatives include no chest pain, headaches,  neck pain, palpitations or shortness of breath. Risk factors for coronary artery disease include smoking/tobacco exposure. Past treatments include ACE inhibitors, calcium channel blockers and central alpha agonists. The current treatment provides no improvement. Compliance problems include psychosocial issues.   Hyperlipidemia  This is a chronic problem. The current episode started more than 1 year ago. Pertinent negatives include no chest pain, myalgias or shortness of breath. Current antihyperlipidemic treatment includes statins. Risk factors for coronary artery disease include dyslipidemia, diabetes mellitus, male sex, hypertension and a sedentary lifestyle.     Review of Systems  Constitutional: Negative for fatigue and unexpected weight change.  HENT: Negative for dental problem, mouth sores and trouble swallowing.   Eyes: Negative for visual disturbance.  Respiratory: Negative for cough, choking, chest tightness, shortness of breath and wheezing.   Cardiovascular: Negative for chest pain, palpitations and leg swelling.  Gastrointestinal: Negative for abdominal distention, abdominal pain, constipation, diarrhea, nausea and vomiting.  Endocrine: Negative for polydipsia, polyphagia and polyuria.  Genitourinary: Negative for dysuria, flank pain, hematuria and urgency.  Musculoskeletal: Negative for back pain, gait problem, myalgias and neck pain.  Skin: Negative for pallor, rash and wound.  Neurological: Negative for seizures, syncope, weakness, numbness and headaches.  Psychiatric/Behavioral: Negative.  Negative for confusion and dysphoric mood.    Objective:    BP (!) 151/81   Pulse 83   Ht 5\' 7"  (1.702 m)   Wt 131 lb (59.4 kg)   BMI 20.52 kg/m   Wt Readings from Last 3 Encounters:  11/24/16 131 lb (59.4 kg)  09/29/16 129 lb (58.5 kg)  09/17/16 125 lb (56.7 kg)    Physical Exam  Constitutional: He is oriented to person, place, and time. He appears well-developed and  well-nourished. He is cooperative. No distress.  HENT:  Head: Normocephalic and atraumatic.  Eyes: EOM are normal.  Neck: Normal range of motion. Neck supple. No tracheal deviation present. No thyromegaly present.  Cardiovascular: Normal rate, S1 normal, S2 normal and normal heart sounds.  Exam reveals no gallop.   No murmur heard. Pulses:      Dorsalis pedis pulses are 1+ on the right side, and 1+ on the left side.       Posterior tibial pulses are 1+ on the right side, and 1+ on the left side.  Pulmonary/Chest: Breath sounds normal. No respiratory distress. He has no wheezes.  Abdominal: Soft. Bowel sounds are normal. He exhibits no distension. There is no tenderness. There is no guarding and no CVA tenderness.  Musculoskeletal: He exhibits no edema.       Right shoulder: He exhibits no swelling and no deformity.  Neurological: He is alert and oriented to person, place, and time. He has normal strength and normal reflexes. No cranial nerve deficit or sensory deficit. Gait normal.  Skin: Skin is warm and dry. No rash noted. No cyanosis. Nails show no clubbing.  Psychiatric: He has a normal mood and affect. His  speech is normal and behavior is normal. Judgment and thought content normal. Cognition and memory are normal.     Complete Blood Count (Most recent): Lab Results  Component Value Date   WBC 10.7 (H) 02/21/2014   HGB 13.1 02/21/2014   HCT 37.0 (L) 02/21/2014   MCV 89.6 02/21/2014   PLT 180 02/21/2014   Chemistry (most recent): Lab Results  Component Value Date   NA 141 11/16/2016   K 4.5 11/16/2016   CL 109 11/16/2016   CO2 29 11/16/2016   BUN 18 11/16/2016   CREATININE 1.39 (H) 11/16/2016   Diabetic Labs (most recent): Lab Results  Component Value Date   HGBA1C 6.0 (H) 11/16/2016   HGBA1C 12.6 (H) 08/26/2016   HGBA1C 6.8 (H) 02/19/2016    Assessment & Plan:   1. Type 2 diabetes mellitus without complication, without long-term current use of insulin  (HCC)  -He had hyperosmolar state in the past, patient remains at a high risk for more acute and chronic complications of diabetes which include CAD, CVA, CKD, retinopathy, and neuropathy. These are all discussed in detail with the patient.  Patient came with significant improvement in his A1c to 6% from 12.6%.   Recent labs reviewed, showing improving renal function.    - I have re-counseled the patient on diet management   by adopting a carbohydrate restricted / protein rich  Diet.  - Suggestion is made for patient to avoid simple carbohydrates   from their diet including Cakes , Desserts, Ice Cream,  Soda (  diet and regular) , Sweet Tea , Candies,  Chips, Cookies, Artificial Sweeteners,   and "Sugar-free" Products .  This will help patient to have stable blood glucose profile and potentially avoid unintended  Weight gain.  - Patient is advised to stick to a routine mealtimes to eat 3 meals  a day and avoid unnecessary snacks ( to snack only to correct hypoglycemia).  - I have approached patient with the following individualized plan to manage diabetes and patient agrees.  - He has benefited from insulin initiation.   -I will continue  Toujeo 15 units to AM with breakfast , continue strict  monitoring blood glucose daily  before breakfast and at bedtime and PRN . - I  will continue  metformin to 500 mg by mouth twice a day.  -Patient is encouraged to call clinic for blood glucose levels less than 70 or above 300 mg /dl.  - Patient is not a candidate for  incretin therapy , however he is an insulin candidate. - Patient specific target  for A1c; LDL, HDL, Triglycerides, and  Waist Circumference were discussed in detail.  2) BP/HTN:  uncontrolled , as usual due to the fact that he did not take his blood pressure medications this morning. Continue current medications including ACEI/ARB. He is a chronic heavy smoker. 3) Lipids/HPL:  continue statins. 4)  Weight/Diet: He has regained 11 lbs  ,, overall and this is a good development for him.  - He would benefit from addition of more complex carbohydrates, information provided. CDE consult is requested. 5. Vitamin D deficiency -New diagnosis for him. He is status post therapy with  D 50,000 units weekly for 12 weeks. - He will continue with vitamin D3 5000 units daily for 90 days.  6) Chronic Care/Health Maintenance:  -Patient is on ACEI/ARB and Statin medications and encouraged to continue to follow up with Ophthalmology, Podiatrist at least yearly or according to recommendations, and advised to  Quit  smoking. I have recommended yearly flu vaccine and pneumonia vaccination at least every 5 years; moderate intensity exercise for up to 150 minutes weekly; and  sleep for at least 7 hours a day.  - 25 minutes of time was spent on the care of this patient , 50% of which was applied for counseling on diabetes complications and their preventions.  - I advised patient to maintain close follow up with [facility] for primary care needs.  Patient is asked to bring meter and  blood glucose logs during their next visit.   Follow up plan: -Return in about 3 months (around 02/24/2017) for follow up with pre-visit labs, meter, and logs.  Marquis Lunch, MD Phone: [phone]  Fax: [phone]   11/24/2016, 10:51 AM

## 2016-11-27 DIAGNOSIS — E119 Type 2 diabetes mellitus without complications: Secondary | ICD-10-CM | POA: Diagnosis not present

## 2016-11-27 DIAGNOSIS — F4320 Adjustment disorder, unspecified: Secondary | ICD-10-CM | POA: Diagnosis not present

## 2016-11-27 DIAGNOSIS — D509 Iron deficiency anemia, unspecified: Secondary | ICD-10-CM | POA: Diagnosis not present

## 2016-11-27 DIAGNOSIS — E785 Hyperlipidemia, unspecified: Secondary | ICD-10-CM | POA: Diagnosis not present

## 2016-12-15 ENCOUNTER — Encounter: Payer: Self-pay | Admitting: Gastroenterology

## 2016-12-24 ENCOUNTER — Other Ambulatory Visit: Payer: Self-pay

## 2016-12-24 MED ORDER — PEN NEEDLES 31G X 5 MM MISC: 1.0000 | Freq: Every day | 2 refills | Status: DC

## 2017-01-11 ENCOUNTER — Other Ambulatory Visit: Payer: Self-pay | Admitting: "Endocrinology

## 2017-02-15 ENCOUNTER — Other Ambulatory Visit: Payer: Self-pay | Admitting: "Endocrinology

## 2017-02-15 DIAGNOSIS — E1165 Type 2 diabetes mellitus with hyperglycemia: Secondary | ICD-10-CM | POA: Diagnosis not present

## 2017-02-15 DIAGNOSIS — E1122 Type 2 diabetes mellitus with diabetic chronic kidney disease: Secondary | ICD-10-CM | POA: Diagnosis not present

## 2017-02-15 DIAGNOSIS — N183 Chronic kidney disease, stage 3 (moderate): Secondary | ICD-10-CM | POA: Diagnosis not present

## 2017-02-15 LAB — COMPREHENSIVE METABOLIC PANEL
ALBUMIN: 4.2 g/dL (ref 3.6–5.1)
ALT: 14 U/L (ref 9–46)
AST: 21 U/L (ref 10–35)
Alkaline Phosphatase: 74 U/L (ref 40–115)
BUN: 17 mg/dL (ref 7–25)
CHLORIDE: 106 mmol/L (ref 98–110)
CO2: 28 mmol/L (ref 20–31)
Calcium: 9.6 mg/dL (ref 8.6–10.3)
Creat: 1.45 mg/dL — ABNORMAL HIGH (ref 0.70–1.25)
Glucose, Bld: 63 mg/dL — ABNORMAL LOW (ref 65–99)
POTASSIUM: 4.1 mmol/L (ref 3.5–5.3)
Sodium: 142 mmol/L (ref 135–146)
TOTAL PROTEIN: 7.7 g/dL (ref 6.1–8.1)
Total Bilirubin: 0.5 mg/dL (ref 0.2–1.2)

## 2017-02-16 LAB — HEMOGLOBIN A1C
HEMOGLOBIN A1C: 5.5 % (ref <5.7)
MEAN PLASMA GLUCOSE: 111 mg/dL

## 2017-02-25 ENCOUNTER — Encounter: Payer: Self-pay | Admitting: "Endocrinology

## 2017-02-25 ENCOUNTER — Ambulatory Visit (INDEPENDENT_AMBULATORY_CARE_PROVIDER_SITE_OTHER): Payer: Medicare Other | Admitting: "Endocrinology

## 2017-02-25 VITALS — BP 156/80 | HR 86 | Ht 67.0 in | Wt 133.0 lb

## 2017-02-25 DIAGNOSIS — E1122 Type 2 diabetes mellitus with diabetic chronic kidney disease: Secondary | ICD-10-CM | POA: Diagnosis not present

## 2017-02-25 DIAGNOSIS — E782 Mixed hyperlipidemia: Secondary | ICD-10-CM | POA: Diagnosis not present

## 2017-02-25 DIAGNOSIS — E1165 Type 2 diabetes mellitus with hyperglycemia: Secondary | ICD-10-CM

## 2017-02-25 DIAGNOSIS — N183 Chronic kidney disease, stage 3 (moderate): Secondary | ICD-10-CM | POA: Diagnosis not present

## 2017-02-25 DIAGNOSIS — IMO0002 Reserved for concepts with insufficient information to code with codable children: Secondary | ICD-10-CM

## 2017-02-25 DIAGNOSIS — I10 Essential (primary) hypertension: Secondary | ICD-10-CM

## 2017-02-25 NOTE — Progress Notes (Signed)
Subjective:    Patient ID: Chase Briggs, male    DOB: 1951-03-19, [facility]   Past Medical History:  Diagnosis Date  . Diabetes mellitus   . Fatty liver   . GERD (gastroesophageal reflux disease)   . HTN (hypertension)   . Hyperlipidemia    Past Surgical History:  Procedure Laterality Date  . BACK SURGERY    . COLONOSCOPY  01/2007   Dr. Lovell SheehanJenkins, reviewed report, mild diverticulosis. Prep adequate. Next TCS due 01/2017  . ESOPHAGOGASTRODUODENOSCOPY N/A 10/21/2012   ZOX:WRUEAVWUSLF:MODERATE GASTRITIS  . MASS EXCISION Right 10/26/2012   Procedure: EXCISION MASS;  Surgeon: Dalia HeadingMark A Jenkins, MD;  Location: [facility];  Service: General;  Laterality: Right;  . scalp mass  4-5 yrs ago   Dr Lovell SheehanJenkins   Social History   Social History  . Marital status: Married    Spouse name: N/A  . Number of children: 1  . Years of education: N/A   Occupational History  .      [employer], heavy lifting   Social History Main Topics  . Smoking status: Current Every Day Smoker    Packs/day: 0.50    Years: 25.00    Types: Cigarettes  . Smokeless tobacco: Never Used  . Alcohol use No     Comment: hx heavy etoh x 3 yrs, quit 30+ yrs ago  . Drug use: No  . Sexual activity: Yes    Birth control/ protection: None   Other Topics Concern  . None   Social History Narrative  . None   Outpatient Encounter Prescriptions as of 02/25/2017  Medication Sig  . amLODipine (NORVASC) 10 MG tablet Take 1 tablet (10 mg total) by mouth daily.  Marland Kitchen. aspirin EC 81 MG tablet Take 81 mg by mouth daily.  . Cholecalciferol (VITAMIN D3) 5000 units CAPS Take 1 capsule (5,000 Units total) by mouth daily.  . cloNIDine (CATAPRES) 0.2 MG tablet TAKE 1 TABLET TWICE A DAY  . Insulin Glargine (TOUJEO SOLOSTAR) 300 UNIT/ML SOPN Inject 15 Units into the skin daily with breakfast.  . Insulin Pen Needle (PEN NEEDLES) 31G X 5 MM MISC 1 each by Does not apply route at bedtime.  Marland Kitchen. losartan (COZAAR) 100 MG  tablet Take 100 mg by mouth daily.  Marland Kitchen. omeprazole (PRILOSEC) 20 MG capsule Take 1 capsule (20 mg total) by mouth daily.  . simvastatin (ZOCOR) 20 MG tablet Take 20 mg by mouth every other day.   . [DISCONTINUED] metFORMIN (GLUCOPHAGE) 500 MG tablet Take 500 mg by mouth 2 (two) times daily with a meal.   No facility-administered encounter medications on file as of 02/25/2017.    ALLERGIES: No Known Allergies VACCINATION STATUS:  There is no immunization history on file for this patient.  Diabetes  He presents for his follow-up diabetic visit. He has type 2 diabetes mellitus. Onset time: He was diagnosed at approximate age of 35 years. His disease course has been improving. There are no hypoglycemic associated symptoms. Pertinent negatives for hypoglycemia include no confusion, headaches, pallor or seizures. There are no diabetic associated symptoms. Pertinent negatives for diabetes include no chest pain, no fatigue, no polydipsia, no polyphagia, no polyuria and no weakness. There are no hypoglycemic complications. Symptoms are improving. Risk factors for coronary artery disease include diabetes mellitus, dyslipidemia, hypertension, male sex and sedentary lifestyle. He is compliant with treatment most of the time. His weight is increasing steadily. He is following a generally unhealthy diet. When  asked about meal planning, he reported none. He has not had a previous visit with a dietitian. His breakfast blood glucose range is generally 110-130 mg/dl. His overall blood glucose range is 110-130 mg/dl. An ACE inhibitor/angiotensin II receptor blocker is being taken. Eye exam is current.  Hypertension  This is a chronic problem. The current episode started more than 1 year ago. The problem is uncontrolled. Pertinent negatives include no chest pain, headaches, neck pain, palpitations or shortness of breath. Risk factors for coronary artery disease include smoking/tobacco exposure. Past treatments include ACE  inhibitors, calcium channel blockers and central alpha agonists. The current treatment provides no improvement. Compliance problems include psychosocial issues.   Hyperlipidemia  This is a chronic problem. The current episode started more than 1 year ago. Pertinent negatives include no chest pain, myalgias or shortness of breath. Current antihyperlipidemic treatment includes statins. Risk factors for coronary artery disease include dyslipidemia, diabetes mellitus, male sex, hypertension and a sedentary lifestyle.     Review of Systems  Constitutional: Negative for fatigue and unexpected weight change.  HENT: Negative for dental problem, mouth sores and trouble swallowing.   Eyes: Negative for visual disturbance.  Respiratory: Negative for cough, choking, chest tightness, shortness of breath and wheezing.   Cardiovascular: Negative for chest pain, palpitations and leg swelling.  Gastrointestinal: Negative for abdominal distention, abdominal pain, constipation, diarrhea, nausea and vomiting.  Endocrine: Negative for polydipsia, polyphagia and polyuria.  Genitourinary: Negative for dysuria, flank pain, hematuria and urgency.  Musculoskeletal: Negative for back pain, gait problem, myalgias and neck pain.  Skin: Negative for pallor, rash and wound.  Neurological: Negative for seizures, syncope, weakness, numbness and headaches.  Psychiatric/Behavioral: Negative.  Negative for confusion and dysphoric mood.    Objective:    BP (!) 156/80   Pulse 86   Ht 5\' 7"  (1.702 m)   Wt 133 lb (60.3 kg)   BMI 20.83 kg/m   Wt Readings from Last 3 Encounters:  02/25/17 133 lb (60.3 kg)  11/24/16 131 lb (59.4 kg)  09/29/16 129 lb (58.5 kg)    Physical Exam  Constitutional: He is oriented to person, place, and time. He appears well-developed and well-nourished. He is cooperative. No distress.  HENT:  Head: Normocephalic and atraumatic.  Eyes: EOM are normal.  Neck: Normal range of motion. Neck supple.  No tracheal deviation present. No thyromegaly present.  Cardiovascular: Normal rate, S1 normal, S2 normal and normal heart sounds.  Exam reveals no gallop.   No murmur heard. Pulses:      Dorsalis pedis pulses are 1+ on the right side, and 1+ on the left side.       Posterior tibial pulses are 1+ on the right side, and 1+ on the left side.  Pulmonary/Chest: Breath sounds normal. No respiratory distress. He has no wheezes.  Abdominal: Soft. Bowel sounds are normal. He exhibits no distension. There is no tenderness. There is no guarding and no CVA tenderness.  Musculoskeletal: He exhibits no edema.       Right shoulder: He exhibits no swelling and no deformity.  Neurological: He is alert and oriented to person, place, and time. He has normal strength and normal reflexes. No cranial nerve deficit or sensory deficit. Gait normal.  Skin: Skin is warm and dry. No rash noted. No cyanosis. Nails show no clubbing.  Psychiatric: He has a normal mood and affect. His speech is normal and behavior is normal. Judgment and thought content normal. Cognition and memory are normal.  Complete Blood Count (Most recent): Lab Results  Component Value Date   WBC 10.7 (H) 02/21/2014   HGB 13.1 02/21/2014   HCT 37.0 (L) 02/21/2014   MCV 89.6 02/21/2014   PLT 180 02/21/2014   Chemistry (most recent): Lab Results  Component Value Date   NA 142 02/15/2017   K 4.1 02/15/2017   CL 106 02/15/2017   CO2 28 02/15/2017   BUN 17 02/15/2017   CREATININE 1.45 (H) 02/15/2017   Diabetic Labs (most recent): Lab Results  Component Value Date   HGBA1C 5.5 02/15/2017   HGBA1C 6.0 (H) 11/16/2016   HGBA1C 12.6 (H) 08/26/2016    Assessment & Plan:   1. Type 2 diabetes mellitus without complication, without long-term current use of insulin (HCC)  -He had hyperosmolar state in the past, patient remains at a high risk for more acute and chronic complications of diabetes which include CAD, CVA, CKD, retinopathy, and  neuropathy. These are all discussed in detail with the patient.  Patient came with significant improvement in his A1c to 5.5% from 12.6%.   Recent labs reviewed, showing improving renal function.    - I have re-counseled the patient on diet management   by adopting a carbohydrate restricted / protein rich  Diet.  - Suggestion is made for patient to avoid simple carbohydrates   from their diet including Cakes , Desserts, Ice Cream,  Soda (  diet and regular) , Sweet Tea , Candies,  Chips, Cookies, Artificial Sweeteners,   and "Sugar-free" Products .  This will help patient to have stable blood glucose profile and potentially avoid unintended  Weight gain.  - Patient is advised to stick to a routine mealtimes to eat 3 meals  a day and avoid unnecessary snacks ( to snack only to correct hypoglycemia).  - I have approached patient with the following individualized plan to manage diabetes and patient agrees.  - He has benefited from insulin initiation.   -I will continue  Toujeo 15 units to AM with breakfast , continue strict  monitoring blood glucose daily  before breakfast and at bedtime and PRN . - I  will discontinue  Metformin for now due to rising serum creatinine. -Patient is encouraged to call clinic for blood glucose levels less than 70 or above 300 mg /dl.  - Patient is not a candidate for  incretin therapy . - Patient specific target  for A1c; LDL, HDL, Triglycerides, and  Waist Circumference were discussed in detail.  2) BP/HTN:  uncontrolled , as usual due to the fact that he did not take his blood pressure medications this morning. Continue current medications including ACEI/ARB. He is a chronic heavy smoker. 3) Lipids/HPL:  continue statins. 4)  Weight/Diet: He has regained 13 lbs , overall and this is a good development for him.  - He would benefit from addition of more complex carbohydrates, information provided. CDE consult is requested.  5. Vitamin D deficiency  - He is  status post therapy with  D 50,000 units weekly for 12 weeks. - He will continue with vitamin D3 5000 units daily for 90 days.   6) Chronic Care/Health Maintenance:  -Patient is on ACEI/ARB and Statin medications and encouraged to continue to follow up with Ophthalmology, Podiatrist at least yearly or according to recommendations, and advised to  Quit  smoking. I have recommended yearly flu vaccine and pneumonia vaccination at least every 5 years; moderate intensity exercise for up to 150 minutes weekly; and  sleep for  at least 7 hours a day.  - 25 minutes of time was spent on the care of this patient , 50% of which was applied for counseling on diabetes complications and their preventions.  - I advised patient to maintain close follow up with [facility] for primary care needs.  Patient is asked to bring meter and  blood glucose logs during their next visit.   Follow up plan: -Return in about 3 months (around 05/28/2017) for follow up with pre-visit labs, meter, and logs.  Marquis Lunch, MD Phone: [phone]  Fax: [phone]   02/25/2017, 9:40 AM

## 2017-03-12 DIAGNOSIS — R252 Cramp and spasm: Secondary | ICD-10-CM | POA: Diagnosis not present

## 2017-03-12 DIAGNOSIS — E119 Type 2 diabetes mellitus without complications: Secondary | ICD-10-CM | POA: Diagnosis not present

## 2017-03-12 DIAGNOSIS — D509 Iron deficiency anemia, unspecified: Secondary | ICD-10-CM | POA: Diagnosis not present

## 2017-03-12 DIAGNOSIS — E785 Hyperlipidemia, unspecified: Secondary | ICD-10-CM | POA: Diagnosis not present

## 2017-05-14 DIAGNOSIS — L03031 Cellulitis of right toe: Secondary | ICD-10-CM | POA: Diagnosis not present

## 2017-05-14 DIAGNOSIS — M6282 Rhabdomyolysis: Secondary | ICD-10-CM | POA: Diagnosis not present

## 2017-05-14 DIAGNOSIS — E785 Hyperlipidemia, unspecified: Secondary | ICD-10-CM | POA: Diagnosis not present

## 2017-05-24 DIAGNOSIS — E1165 Type 2 diabetes mellitus with hyperglycemia: Secondary | ICD-10-CM | POA: Diagnosis not present

## 2017-05-24 DIAGNOSIS — N183 Chronic kidney disease, stage 3 (moderate): Secondary | ICD-10-CM | POA: Diagnosis not present

## 2017-05-24 DIAGNOSIS — E1122 Type 2 diabetes mellitus with diabetic chronic kidney disease: Secondary | ICD-10-CM | POA: Diagnosis not present

## 2017-05-25 LAB — RENAL FUNCTION PANEL
ALBUMIN MSPROF: 4.4 g/dL (ref 3.6–5.1)
BUN/Creatinine Ratio: 12 (calc) (ref 6–22)
BUN: 18 mg/dL (ref 7–25)
CALCIUM: 10.1 mg/dL (ref 8.6–10.3)
CO2: 30 mmol/L (ref 20–32)
Chloride: 102 mmol/L (ref 98–110)
Creat: 1.52 mg/dL — ABNORMAL HIGH (ref 0.70–1.25)
GLUCOSE: 154 mg/dL — AB (ref 65–99)
PHOSPHORUS: 3.6 mg/dL (ref 2.1–4.3)
Potassium: 4.7 mmol/L (ref 3.5–5.3)
Sodium: 138 mmol/L (ref 135–146)

## 2017-05-25 LAB — HEMOGLOBIN A1C
EAG (MMOL/L): 8.4 (calc)
HEMOGLOBIN A1C: 6.9 %{Hb} — AB (ref <5.7)
Mean Plasma Glucose: 151 (calc)

## 2017-05-31 ENCOUNTER — Encounter: Payer: Self-pay | Admitting: "Endocrinology

## 2017-05-31 ENCOUNTER — Ambulatory Visit (INDEPENDENT_AMBULATORY_CARE_PROVIDER_SITE_OTHER): Payer: Medicare Other | Admitting: "Endocrinology

## 2017-05-31 VITALS — BP 146/71 | HR 87 | Ht 67.0 in | Wt 136.0 lb

## 2017-05-31 DIAGNOSIS — E1165 Type 2 diabetes mellitus with hyperglycemia: Secondary | ICD-10-CM

## 2017-05-31 DIAGNOSIS — E559 Vitamin D deficiency, unspecified: Secondary | ICD-10-CM

## 2017-05-31 DIAGNOSIS — N183 Chronic kidney disease, stage 3 (moderate): Secondary | ICD-10-CM | POA: Diagnosis not present

## 2017-05-31 DIAGNOSIS — E782 Mixed hyperlipidemia: Secondary | ICD-10-CM

## 2017-05-31 DIAGNOSIS — E1122 Type 2 diabetes mellitus with diabetic chronic kidney disease: Secondary | ICD-10-CM

## 2017-05-31 DIAGNOSIS — I10 Essential (primary) hypertension: Secondary | ICD-10-CM

## 2017-05-31 DIAGNOSIS — IMO0002 Reserved for concepts with insufficient information to code with codable children: Secondary | ICD-10-CM

## 2017-05-31 NOTE — Progress Notes (Signed)
Subjective:    Patient ID: Chase Briggs, male    DOB: Dec 20, 1950, [facility], [facility]   Past Medical History:  Diagnosis Date  . Diabetes mellitus   . Fatty liver   . GERD (gastroesophageal reflux disease)   . HTN (hypertension)   . Hyperlipidemia    Past Surgical History:  Procedure Laterality Date  . BACK SURGERY    . COLONOSCOPY  01/2007   Dr. [name], reviewed report, mild diverticulosis. Prep adequate. Next TCS due 01/2017  . ESOPHAGOGASTRODUODENOSCOPY N/A 10/21/2012   ZOX:WRUEAVWU GASTRITIS  . MASS EXCISION Right 10/26/2012   Procedure: EXCISION MASS;  Surgeon: [name], MD;  Location: [facility];  Service: General;  Laterality: Right;  . scalp mass  4-5 yrs ago   Dr [name]   Social History   Social History  . Marital status: Married    Spouse name: N/A  . Number of children: 1  . Years of education: N/A   Occupational History  .      [employer], heavy lifting   Social History Main Topics  . Smoking status: Current Every Day Smoker    Packs/day: 0.50    Years: 25.00    Types: Cigarettes  . Smokeless tobacco: Never Used  . Alcohol use No     Comment: hx heavy etoh x 3 yrs, quit 30+ yrs ago  . Drug use: No  . Sexual activity: Yes    Birth control/ protection: None   Other Topics Concern  . None   Social History Narrative  . None   Outpatient Encounter Prescriptions as of 05/31/2017  Medication Sig  . amLODipine (NORVASC) 10 MG tablet Take 1 tablet (10 mg total) by mouth daily.  Marland Kitchen aspirin EC 81 MG tablet Take 81 mg by mouth daily.  . Cholecalciferol (VITAMIN D3) 5000 units CAPS Take 1 capsule (5,000 Units total) by mouth daily.  . cloNIDine (CATAPRES) 0.2 MG tablet TAKE 1 TABLET TWICE A DAY  . Insulin Glargine (TOUJEO SOLOSTAR) 300 UNIT/ML SOPN Inject 15 Units into the skin daily with breakfast.  . Insulin Pen Needle (PEN NEEDLES) 31G X 5 MM MISC 1 each by Does not apply route at bedtime.  Marland Kitchen losartan (COZAAR) 100 MG  tablet Take 100 mg by mouth daily.  Marland Kitchen omeprazole (PRILOSEC) 20 MG capsule Take 1 capsule (20 mg total) by mouth daily.  . simvastatin (ZOCOR) 20 MG tablet Take 20 mg by mouth every other day.    No facility-administered encounter medications on file as of 05/31/2017.    ALLERGIES: No Known Allergies VACCINATION STATUS:  There is no immunization history on file for this patient.  Diabetes  He presents for his follow-up diabetic visit. He has type 2 diabetes mellitus. Onset time: He was diagnosed at approximate age of 35 years. His disease course has been improving. There are no hypoglycemic associated symptoms. Pertinent negatives for hypoglycemia include no confusion, headaches, pallor or seizures. There are no diabetic associated symptoms. Pertinent negatives for diabetes include no chest pain, no fatigue, no polydipsia, no polyphagia, no polyuria and no weakness. There are no hypoglycemic complications. Symptoms are improving. Risk factors for coronary artery disease include diabetes mellitus, dyslipidemia, hypertension, male sex and sedentary lifestyle. He is compliant with treatment most of the time. His weight is increasing steadily. He is following a generally unhealthy diet. When asked about meal planning, he reported none. He has not had a previous visit with a dietitian. His breakfast blood  glucose range is generally 110-130 mg/dl. His overall blood glucose range is 110-130 mg/dl. An ACE inhibitor/angiotensin II receptor blocker is being taken. Eye exam is current.  Hypertension  This is a chronic problem. The current episode started more than 1 year ago. The problem is uncontrolled. Pertinent negatives include no chest pain, headaches, neck pain, palpitations or shortness of breath. Risk factors for coronary artery disease include smoking/tobacco exposure. Past treatments include ACE inhibitors, calcium channel blockers and central alpha agonists. The current treatment provides no  improvement. Compliance problems include psychosocial issues.   Hyperlipidemia  This is a chronic problem. The current episode started more than 1 year ago. Pertinent negatives include no chest pain, myalgias or shortness of breath. Current antihyperlipidemic treatment includes statins. Risk factors for coronary artery disease include dyslipidemia, diabetes mellitus, male sex, hypertension and a sedentary lifestyle.     Review of Systems  Constitutional: Negative for fatigue and unexpected weight change.  HENT: Negative for dental problem, mouth sores and trouble swallowing.   Eyes: Negative for visual disturbance.  Respiratory: Negative for cough, choking, chest tightness, shortness of breath and wheezing.   Cardiovascular: Negative for chest pain, palpitations and leg swelling.  Gastrointestinal: Negative for abdominal distention, abdominal pain, constipation, diarrhea, nausea and vomiting.  Endocrine: Negative for polydipsia, polyphagia and polyuria.  Genitourinary: Negative for dysuria, flank pain, hematuria and urgency.  Musculoskeletal: Negative for back pain, gait problem, myalgias and neck pain.  Skin: Negative for pallor, rash and wound.  Neurological: Negative for seizures, syncope, weakness, numbness and headaches.  Psychiatric/Behavioral: Negative.  Negative for confusion and dysphoric mood.    Objective:    BP (!) 146/71   Pulse 87   Ht  (1.702 m)   Wt 136 lb (61.7 kg)   BMI 21.30 kg/m   Wt Readings from Last 3 Encounters:  05/31/17 136 lb (61.7 kg)  02/25/17 133 lb (60.3 kg)  11/24/16 131 lb (59.4 kg)    Physical Exam  Constitutional: He is oriented to person, place, and time. He appears well-developed and well-nourished. He is cooperative. No distress.  HENT:  Head: Normocephalic and atraumatic.  Eyes: EOM are normal.  Neck: Normal range of motion. Neck supple. No tracheal deviation present. No thyromegaly present.  Cardiovascular: Normal rate, S1 normal,  S2 normal and normal heart sounds.  Exam reveals no gallop.   No murmur heard. Pulses:      Dorsalis pedis pulses are 1+ on the right side, and 1+ on the left side.       Posterior tibial pulses are 1+ on the right side, and 1+ on the left side.  Pulmonary/Chest: Breath sounds normal. No respiratory distress. He has no wheezes.  Abdominal: Soft. Bowel sounds are normal. He exhibits no distension. There is no tenderness. There is no guarding and no CVA tenderness.  Musculoskeletal: He exhibits no edema.       Right shoulder: He exhibits no swelling and no deformity.  Neurological: He is alert and oriented to person, place, and time. He has normal strength and normal reflexes. No cranial nerve deficit or sensory deficit. Gait normal.  Skin: Skin is warm and dry. No rash noted. No cyanosis. Nails show no clubbing.  Psychiatric: He has a normal mood and affect. His speech is normal and behavior is normal. Judgment and thought content normal. Cognition and memory are normal.     Complete Blood Count (Most recent): Lab Results  Component Value Date   WBC 10.7 (H) 02/21/2014  HGB 13.1 02/21/2014   HCT 37.0 (L) 02/21/2014   MCV 89.6 02/21/2014   PLT 180 02/21/2014   Chemistry (most recent): Lab Results  Component Value Date   NA 138 05/24/2017   K 4.7 05/24/2017   CL 102 05/24/2017   CO2 30 05/24/2017   BUN 18 05/24/2017   CREATININE 1.52 (H) 05/24/2017   Diabetic Labs (most recent): Lab Results  Component Value Date   HGBA1C 6.9 (H) 05/24/2017   HGBA1C 5.5 02/15/2017   HGBA1C 6.0 (H) 11/16/2016    Assessment & Plan:   1. Type 2 diabetes mellitus without complication, without long-term current use of insulin (HCC)  -He had hyperosmolar state in the past, patient remains at a high risk for more acute and chronic complications of diabetes which include CAD, CVA, CKD, retinopathy, and neuropathy. These are all discussed in detail with the patient.  Patient came with controlled  glycemic profile and stable A1c of 6.9%, generally improving from 12.6%.   Recent labs reviewed, showing improving renal function.   - I have re-counseled the patient on diet management   by adopting a carbohydrate restricted / protein rich  Diet.  - Suggestion is made for patient to avoid simple carbohydrates   from their diet including Cakes , Desserts, Ice Cream,  Soda (  diet and regular) , Sweet Tea , Candies,  Chips, Cookies, Artificial Sweeteners,   and "Sugar-free" Products .  This will help patient to have stable blood glucose profile and potentially avoid unintended  Weight gain.  - Patient is advised to stick to a routine mealtimes to eat 3 meals  a day and avoid unnecessary snacks ( to snack only to correct hypoglycemia).  - I have approached patient with the following individualized plan to manage diabetes and patient agrees.  - He has benefited from insulin initiation.   - I will continue Toujeo 15 units  Every morning with breakfast , continue strict  monitoring blood glucose daily  before breakfast and at bedtime and PRN . -   he has stage 3 renal insufficiency , will stay off of metformin for now. If his renal insufficiency continues, he will need consultation with nephrology.  -Patient is encouraged to call clinic for blood glucose levels less than 70 or above 300 mg /dl.  - Patient is not a candidate for  incretin therapy . - Patient specific target  for A1c; LDL, HDL, Triglycerides, and  Waist Circumference were discussed in detail.  2) BP/HTN:  uncontrolled , as usual due to the fact that he did not take his blood pressure medications this morning. Continue current medications including ACEI/ARB. He is a chronic heavy smoker.  3) Lipids/HPL:  continue statins. 4)  Weight/Diet: He has regained 16 lbs  overall and this is a good development for him.  - He would benefit from addition of more complex carbohydrates, information provided. CDE consult is requested.  5. Vitamin  D deficiency - His insurance would not allow for 25 hydroxy vitamin D recheck. - He will continue with vitamin D3 5000 units daily for 90 days.   6) Chronic Care/Health Maintenance:  -Patient is on ACEI/ARB and Statin medications and encouraged to continue to follow up with Ophthalmology, Podiatrist at least yearly or according to recommendations, and advised to  Quit  smoking. I have recommended yearly flu vaccine and pneumonia vaccination at least every 5 years; moderate intensity exercise for up to 150 minutes weekly; and  sleep for at least 7 hours  a day.  - Time spent with the patient: 25 min, of which >50% was spent in reviewing his sugar logs , discussing his hypo- and hyper-glycemic episodes, reviewing his current and  previous labs and insulin doses and developing a plan to avoid hypo- and hyper-glycemia.  - I advised patient to maintain close follow up with [facility] for primary care needs. - I have advised him to avoid over-the-counter pain medications to avoid additional risk for renal insufficiency.  Follow up plan: -Return in about 3 months (around 08/30/2017) for follow up with pre-visit labs, meter, and logs.  Marquis Lunch, MD Phone: [phone]  Fax: [phone]  This note was partially dictated with voice recognition software. Similar sounding words can be transcribed inadequately or may not  be corrected upon review.  05/31/2017, 8:54 AM

## 2017-07-09 DIAGNOSIS — M25569 Pain in unspecified knee: Secondary | ICD-10-CM | POA: Diagnosis not present

## 2017-07-09 DIAGNOSIS — E119 Type 2 diabetes mellitus without complications: Secondary | ICD-10-CM | POA: Diagnosis not present

## 2017-07-09 DIAGNOSIS — E785 Hyperlipidemia, unspecified: Secondary | ICD-10-CM | POA: Diagnosis not present

## 2017-07-09 DIAGNOSIS — I10 Essential (primary) hypertension: Secondary | ICD-10-CM | POA: Diagnosis not present

## 2017-08-12 ENCOUNTER — Emergency Department (HOSPITAL_COMMUNITY): Payer: Medicare Other

## 2017-08-12 ENCOUNTER — Emergency Department (HOSPITAL_COMMUNITY)
Admission: EM | Admit: 2017-08-12 | Discharge: 2017-08-12 | Disposition: A | Discharge status: Home / Self Care | Payer: Medicare Other | Attending: Emergency Medicine | Admitting: Emergency Medicine

## 2017-08-12 ENCOUNTER — Encounter (HOSPITAL_COMMUNITY): Payer: Self-pay

## 2017-08-12 DIAGNOSIS — E119 Type 2 diabetes mellitus without complications: Secondary | ICD-10-CM | POA: Insufficient documentation

## 2017-08-12 DIAGNOSIS — S39012A Strain of muscle, fascia and tendon of lower back, initial encounter: Secondary | ICD-10-CM

## 2017-08-12 DIAGNOSIS — M25551 Pain in right hip: Secondary | ICD-10-CM | POA: Diagnosis not present

## 2017-08-12 DIAGNOSIS — Y929 Unspecified place or not applicable: Secondary | ICD-10-CM | POA: Diagnosis not present

## 2017-08-12 DIAGNOSIS — Z794 Long term (current) use of insulin: Secondary | ICD-10-CM | POA: Diagnosis not present

## 2017-08-12 DIAGNOSIS — Y999 Unspecified external cause status: Secondary | ICD-10-CM | POA: Diagnosis not present

## 2017-08-12 DIAGNOSIS — I10 Essential (primary) hypertension: Secondary | ICD-10-CM | POA: Diagnosis not present

## 2017-08-12 DIAGNOSIS — X58XXXA Exposure to other specified factors, initial encounter: Secondary | ICD-10-CM | POA: Insufficient documentation

## 2017-08-12 DIAGNOSIS — Z79899 Other long term (current) drug therapy: Secondary | ICD-10-CM | POA: Diagnosis not present

## 2017-08-12 DIAGNOSIS — M545 Low back pain: Secondary | ICD-10-CM | POA: Diagnosis not present

## 2017-08-12 DIAGNOSIS — S3992XA Unspecified injury of lower back, initial encounter: Secondary | ICD-10-CM | POA: Diagnosis present

## 2017-08-12 DIAGNOSIS — F17210 Nicotine dependence, cigarettes, uncomplicated: Secondary | ICD-10-CM | POA: Diagnosis not present

## 2017-08-12 DIAGNOSIS — Y939 Activity, unspecified: Secondary | ICD-10-CM | POA: Insufficient documentation

## 2017-08-12 MED ORDER — METHOCARBAMOL 500 MG PO TABS: 500.0000 mg | ORAL_TABLET | Freq: Three times a day (TID) | ORAL | 0 refills | Status: DC

## 2017-08-12 MED ORDER — PREDNISONE 10 MG PO TABS: ORAL_TABLET | ORAL | 0 refills | Status: DC

## 2017-08-12 MED ORDER — DEXAMETHASONE SODIUM PHOSPHATE 4 MG/ML IJ SOLN
10.0000 mg | Freq: Once | INTRAMUSCULAR | Status: AC
  Administered 2017-08-12: 10 mg via INTRAMUSCULAR
  Filled 2017-08-12: qty 3

## 2017-08-12 NOTE — ED Provider Notes (Signed)
[facility] EMERGENCY DEPARTMENT Provider Note   CSN: [id number] Arrival date & time: 08/12/17  4098     History   Chief Complaint Chief Complaint  Patient presents with  . Back Pain    HPI ZOLTAN GENEST is a 66 y.o. male.  HPI   TAVARIS EUDY is a 66 y.o. male who presents to the Emergency Department complaining of recurrent right low back pain for 1 week.  He describes a sharp throbbing pain that radiates from his right lower back into his right hip and buttock.  Pain is worse with certain movements and improves at rest.  He states this is a recurring issue for him.  He denies known injury, urine or bowel changes, abdominal pain, fever, pain numbness or weakness of the right lower leg.    Past Medical History:  Diagnosis Date  . Diabetes mellitus   . Fatty liver   . GERD (gastroesophageal reflux disease)   . HTN (hypertension)   . Hyperlipidemia     Patient Active Problem List   Diagnosis Date Noted  . Mixed hyperlipidemia 08/27/2015  . Vitamin D deficiency 08/27/2015  . Uncontrolled type 2 diabetes mellitus with stage 3 chronic kidney disease, without long-term current use of insulin (HCC) 02/20/2014  . Severe protein-calorie malnutrition (HCC) 02/20/2014  . Fatty liver 09/21/2012  . Weight loss 09/21/2012  . Sternum pain 05/20/2011  . Screen for colon cancer 05/20/2011  . RUQ pain 01/07/2011  . Essential hypertension 01/07/2011    Past Surgical History:  Procedure Laterality Date  . BACK SURGERY    . COLONOSCOPY  01/2007   Dr. [name], reviewed report, mild diverticulosis. Prep adequate. Next TCS due 01/2017  . ESOPHAGOGASTRODUODENOSCOPY N/A 10/21/2012   [id number] GASTRITIS  . MASS EXCISION Right 10/26/2012   Procedure: EXCISION MASS;  Surgeon: [name], MD;  Location: [facility];  Service: General;  Laterality: Right;  . scalp mass  4-5 yrs ago   Dr [name]       Home Medications    Prior to Admission medications   Medication Sig  Start Date End Date Taking? Authorizing Provider  acetaminophen (TYLENOL) 500 MG tablet Take 500 mg by mouth every 6 (six) hours as needed for moderate pain.   Yes [provider]  amLODipine (NORVASC) 10 MG tablet Take 1 tablet (10 mg total) by mouth daily. 10/03/15  Yes Roma Kayser, MD  aspirin EC 81 MG tablet Take 81 mg by mouth daily.   Yes [provider]  Cholecalciferol (VITAMIN D3) 5000 units CAPS Take 1 capsule (5,000 Units total) by mouth daily. 11/24/16  Yes Nida, Denman George, MD  cloNIDine (CATAPRES) 0.3 MG tablet Take 0.3 mg by mouth at bedtime.   Yes [provider]  Insulin Glargine (TOUJEO SOLOSTAR) 300 UNIT/ML SOPN Inject 15 Units into the skin daily with breakfast. 09/29/16  Yes Nida, Denman George, MD  losartan (COZAAR) 100 MG tablet Take 100 mg by mouth daily.   Yes [provider]  omeprazole (PRILOSEC) 20 MG capsule Take 1 capsule (20 mg total) by mouth daily. 09/21/12  Yes Lorenza Burton L, NP  Insulin Pen Needle (PEN NEEDLES) 31G X 5 MM MISC 1 each by Does not apply route at bedtime. 12/24/16   Roma Kayser, MD  methocarbamol (ROBAXIN) 500 MG tablet Take 1 tablet (500 mg total) by mouth 3 (three) times daily. 08/12/17   Gabriellah Rabel, PA-C  predniSONE (DELTASONE) 10 MG tablet Take 6 tablets day one,  5 tablets day two, 4 tablets day three, 3 tablets day four, 2 tablets day five, then 1 tablet day six 08/12/17   Asheley Hellberg, PA-C    Family History Family History  Problem Relation Age of Onset  . Diabetes Mother   . Diabetes Father   . Diabetes Brother   . Colon cancer Neg Hx   . Liver disease Neg Hx     Social History Social History   Tobacco Use  . Smoking status: Current Every Day Smoker    Packs/day: 0.50    Years: 25.00    Pack years: 12.50    Types: Cigarettes  . Smokeless tobacco: Never Used  Substance Use Topics  . Alcohol use: No    Comment: hx heavy etoh x 3 yrs, quit 30+ yrs ago  . Drug  use: No     Allergies   Patient has no known allergies.   Review of Systems Review of Systems  Constitutional: Negative for fever.  Respiratory: Negative for shortness of breath.   Gastrointestinal: Negative for abdominal pain, constipation and vomiting.  Genitourinary: Negative for decreased urine volume, difficulty urinating, dysuria, flank pain and hematuria.  Musculoskeletal: Positive for back pain. Negative for joint swelling.  Skin: Negative for rash.  Neurological: Negative for weakness and numbness.  All other systems reviewed and are negative.    Physical Exam Updated Vital Signs BP (!) 146/102 (BP Location: Right Arm)   Pulse 81   Temp 97.8 F (36.6 C) (Oral)   Resp 18   Ht 5\' 7"  (1.702 m)   Wt 62.6 kg (138 lb)   SpO2 98%   BMI 21.61 kg/m   Physical Exam  Constitutional: He is oriented to person, place, and time. He appears well-developed and well-nourished. No distress.  HENT:  Head: Normocephalic and atraumatic.  Neck: Normal range of motion. Neck supple.  Cardiovascular: Normal rate, regular rhythm and intact distal pulses.  No murmur heard. Pulmonary/Chest: Effort normal and breath sounds normal. No respiratory distress.  Abdominal: Soft. He exhibits no distension. There is no tenderness.  Musculoskeletal: He exhibits tenderness. He exhibits no edema.       Lumbar back: He exhibits tenderness and pain. He exhibits normal range of motion, no swelling, no deformity, no laceration and normal pulse.  Tenderness palpation of the right lower lumbar paraspinal muscles and SI joint space.  Patient has 5/5 strength against resistance of bilateral lower extremities.  Negative bilateral straight leg raise   Neurological: He is alert and oriented to person, place, and time. He has normal strength. No sensory deficit. He exhibits normal muscle tone. Coordination and gait normal.  Reflex Scores:      Patellar reflexes are 2+ on the right side and 2+ on the left  side.      Achilles reflexes are 2+ on the right side and 2+ on the left side. Skin: Skin is warm and dry. Capillary refill takes less than 2 seconds. No rash noted.  Nursing note and vitals reviewed.    ED Treatments / Results  Labs (all labs ordered are listed, but only abnormal results are displayed) Labs Reviewed - No data to display  EKG  EKG Interpretation None       Radiology Dg Lumbar Spine Complete  Result Date: 08/12/2017 CLINICAL DATA:  Right-sided back pain. EXAM: LUMBAR SPINE - COMPLETE 4+ VIEW COMPARISON:  CT 10/24/2012 . FINDINGS: Degenerative changes and scoliosis lumbar spine. No acute bony abnormality. Degenerative changes both hips. Aortoiliac atherosclerotic vascular  calcification. IMPRESSION: 1. Degenerative changes lumbar spine with scoliosis concave left. Degenerative changes most prominent at L3 and L4. No acute bony abnormality . 2. Aortoiliac atherosclerotic vascular disease. Electronically Signed   By: Maisie Fushomas  Register   On: 08/12/2017 11:04   Dg Hip Unilat W Or Wo Pelvis 2-3 Views Right  Result Date: 08/12/2017 CLINICAL DATA:  Right low back pain, right hip pain. EXAM: DG HIP (WITH OR WITHOUT PELVIS) 2-3V RIGHT COMPARISON:  None. FINDINGS: Mild symmetric degenerative changes in the hips with spurring. SI joints are symmetric and unremarkable. No acute bony abnormality. Specifically, no fracture, subluxation, or dislocation. Soft tissues are intact. 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  No acute bony abnormality. Electronically Signed   By: Charlett NoseKevin  Dover M.D.   On: 08/12/2017 11:02    Procedures Procedures (including critical care time)  Medications Ordered in ED Medications  dexamethasone (DECADRON) injection 10 mg (10 mg Intramuscular Given 08/12/17 1145)     Initial Impression / Assessment and Plan / ED Course  I have reviewed the triage vital signs and the nursing notes.  Pertinent labs & imaging results that were available during  my care of the patient were reviewed by me and considered in my medical decision making (see chart for details).     Patient well-appearing.  Neurovascularly intact.  No focal neuro deficits on exam.  Patient is ambulatory with steady gait.  On review of patient's medical record laboratory studies from 05/31/2017 show chronic kidney disease, so I will refrain from use of NSAIDs.  We will try steroids and muscle relaxers.  Patient agrees to close follow-up with his PCP.  Return cautions discussed.  Final Clinical Impressions(s) / ED Diagnoses   Final diagnoses:  Strain of lumbar region, initial encounter  Acute pain of right hip    ED Discharge Orders        Ordered    predniSONE (DELTASONE) 10 MG tablet     08/12/17 1138    methocarbamol (ROBAXIN) 500 MG tablet  3 times daily     08/12/17 1138       Pauline Ausriplett, Aadvik Roker, PA-C 08/12/17 2019    Linwood DibblesKnapp, Jon, MD 08/14/17 [phone]

## 2017-08-12 NOTE — ED Triage Notes (Signed)
Pt reports r lower back pain radiating into r hip and thigh x 1 week.  Denies injury.  Reports long history of back pain.  Denies urinary symptoms.

## 2017-08-12 NOTE — Discharge Instructions (Signed)
You can alternate ice and heat on and off to your back and right hip as needed.  Follow-up with your back specialist if your pain is worsening or persist.

## 2017-08-12 NOTE — ED Notes (Signed)
Tammy PA aware of bp, instructed to monitor bp and follow up with pcp.

## 2017-08-20 DIAGNOSIS — E1122 Type 2 diabetes mellitus with diabetic chronic kidney disease: Secondary | ICD-10-CM | POA: Diagnosis not present

## 2017-08-20 DIAGNOSIS — E1165 Type 2 diabetes mellitus with hyperglycemia: Secondary | ICD-10-CM | POA: Diagnosis not present

## 2017-08-20 DIAGNOSIS — N183 Chronic kidney disease, stage 3 (moderate): Secondary | ICD-10-CM | POA: Diagnosis not present

## 2017-08-20 DIAGNOSIS — E782 Mixed hyperlipidemia: Secondary | ICD-10-CM | POA: Diagnosis not present

## 2017-08-21 LAB — LIPID PANEL
Cholesterol: 104 mg/dL (ref <200)
HDL: 43 mg/dL (ref >40)
LDL Cholesterol (Calc): 45 mg/dL (calc)
NON-HDL CHOLESTEROL (CALC): 61 mg/dL (ref <130)
TRIGLYCERIDES: 84 mg/dL (ref <150)
Total CHOL/HDL Ratio: 2.4 (calc) (ref <5.0)

## 2017-08-21 LAB — COMPLETE METABOLIC PANEL WITH GFR
AG Ratio: 1.2 (calc) (ref 1.0–2.5)
ALBUMIN MSPROF: 3.7 g/dL (ref 3.6–5.1)
ALT: 14 U/L (ref 9–46)
AST: 12 U/L (ref 10–35)
Alkaline phosphatase (APISO): 113 U/L (ref 40–115)
BUN / CREAT RATIO: 12 (calc) (ref 6–22)
BUN: 20 mg/dL (ref 7–25)
CALCIUM: 9.5 mg/dL (ref 8.6–10.3)
CO2: 31 mmol/L (ref 20–32)
CREATININE: 1.62 mg/dL — AB (ref 0.70–1.25)
Chloride: 101 mmol/L (ref 98–110)
GFR, EST AFRICAN AMERICAN: 51 mL/min/{1.73_m2} — AB (ref >=60)
GFR, EST NON AFRICAN AMERICAN: 44 mL/min/{1.73_m2} — AB (ref >=60)
GLUCOSE: 300 mg/dL — AB (ref 65–99)
Globulin: 3 g/dL (calc) (ref 1.9–3.7)
Potassium: 4.1 mmol/L (ref 3.5–5.3)
Sodium: 139 mmol/L (ref 135–146)
TOTAL PROTEIN: 6.7 g/dL (ref 6.1–8.1)
Total Bilirubin: 0.3 mg/dL (ref 0.2–1.2)

## 2017-08-21 LAB — HEMOGLOBIN A1C
EAG (MMOL/L): 10.9 (calc)
Hgb A1c MFr Bld: 8.5 % of total Hgb — ABNORMAL HIGH (ref <5.7)
Mean Plasma Glucose: 197 (calc)

## 2017-08-21 LAB — MICROALBUMIN / CREATININE URINE RATIO
Creatinine, Urine: 284 mg/dL (ref 20–320)
MICROALB/CREAT RATIO: 35 ug/mg{creat} — AB (ref <30)
Microalb, Ur: 9.9 mg/dL

## 2017-08-25 ENCOUNTER — Emergency Department (HOSPITAL_COMMUNITY)
Admission: EM | Admit: 2017-08-25 | Discharge: 2017-08-25 | Disposition: A | Discharge status: Home / Self Care | Payer: Medicare Other | Attending: Emergency Medicine | Admitting: Emergency Medicine

## 2017-08-25 ENCOUNTER — Encounter (HOSPITAL_COMMUNITY): Payer: Self-pay | Admitting: *Deleted

## 2017-08-25 ENCOUNTER — Other Ambulatory Visit: Payer: Self-pay

## 2017-08-25 DIAGNOSIS — Z7982 Long term (current) use of aspirin: Secondary | ICD-10-CM | POA: Diagnosis not present

## 2017-08-25 DIAGNOSIS — Z794 Long term (current) use of insulin: Secondary | ICD-10-CM | POA: Diagnosis not present

## 2017-08-25 DIAGNOSIS — E1165 Type 2 diabetes mellitus with hyperglycemia: Secondary | ICD-10-CM | POA: Insufficient documentation

## 2017-08-25 DIAGNOSIS — F17210 Nicotine dependence, cigarettes, uncomplicated: Secondary | ICD-10-CM | POA: Diagnosis not present

## 2017-08-25 DIAGNOSIS — I10 Essential (primary) hypertension: Secondary | ICD-10-CM | POA: Diagnosis not present

## 2017-08-25 DIAGNOSIS — R739 Hyperglycemia, unspecified: Secondary | ICD-10-CM

## 2017-08-25 DIAGNOSIS — Z79899 Other long term (current) drug therapy: Secondary | ICD-10-CM | POA: Diagnosis not present

## 2017-08-25 LAB — CBC
HEMATOCRIT: 44.1 % (ref 39.0–52.0)
HEMOGLOBIN: 14.6 g/dL (ref 13.0–17.0)
MCH: 31.9 pg (ref 26.0–34.0)
MCHC: 33.1 g/dL (ref 30.0–36.0)
MCV: 96.3 fL (ref 78.0–100.0)
Platelets: 201 10*3/uL (ref 150–400)
RBC: 4.58 MIL/uL (ref 4.22–5.81)
RDW: 13.3 % (ref 11.5–15.5)
WBC: 7.7 10*3/uL (ref 4.0–10.5)

## 2017-08-25 LAB — URINALYSIS, ROUTINE W REFLEX MICROSCOPIC
BACTERIA UA: NONE SEEN
Bilirubin Urine: NEGATIVE
Glucose, UA: >=500 mg/dL — AB
Hgb urine dipstick: NEGATIVE
Ketones, ur: NEGATIVE mg/dL
Leukocytes, UA: NEGATIVE
Nitrite: NEGATIVE
PROTEIN: NEGATIVE mg/dL
SPECIFIC GRAVITY, URINE: 1.008 (ref 1.005–1.030)
pH: 7 (ref 5.0–8.0)

## 2017-08-25 LAB — BASIC METABOLIC PANEL
ANION GAP: 10 (ref 5–15)
BUN: 20 mg/dL (ref 6–20)
CALCIUM: 9.8 mg/dL (ref 8.9–10.3)
CO2: 24 mmol/L (ref 22–32)
Chloride: 96 mmol/L — ABNORMAL LOW (ref 101–111)
Creatinine, Ser: 1.67 mg/dL — ABNORMAL HIGH (ref 0.61–1.24)
GFR calc Af Amer: 48 mL/min — ABNORMAL LOW (ref >60)
GFR calc non Af Amer: 41 mL/min — ABNORMAL LOW (ref >60)
GLUCOSE: 456 mg/dL — AB (ref 65–99)
POTASSIUM: 4.4 mmol/L (ref 3.5–5.1)
Sodium: 130 mmol/L — ABNORMAL LOW (ref 135–145)

## 2017-08-25 LAB — CBG MONITORING, ED
Glucose-Capillary: 458 mg/dL — ABNORMAL HIGH (ref 65–99)
Glucose-Capillary: 299 mg/dL — ABNORMAL HIGH (ref 65–99)

## 2017-08-25 MED ORDER — SODIUM CHLORIDE 0.9 % IV BOLUS (SEPSIS)
1000.0000 mL | Freq: Once | INTRAVENOUS | Status: AC
  Administered 2017-08-25: 1000 mL via INTRAVENOUS

## 2017-08-25 NOTE — ED Notes (Signed)
ED Provider at bedside. 

## 2017-08-25 NOTE — ED Triage Notes (Signed)
Blood sugar running high

## 2017-08-25 NOTE — ED Provider Notes (Signed)
[facility] EMERGENCY DEPARTMENT Provider Note   CSN: [id number] Arrival date & time: 08/25/17  [id number]     History   Chief Complaint Chief Complaint  Patient presents with  . Hyperglycemia    HPI Chase Briggs is a 66 y.o. male.  HPI  66 year old male with a history of type 2 diabetes presents with increased glucose for the last week or more.  Glucose has been running in the 400s.  Has been taking his insulin as prescribed.  2 weeks ago he was given a steroid shot and prednisone for hip pain.  Last finished the prednisone 1 week ago.  Continues to have high glucoses.  He states he feels tired but otherwise denies any acute illness such as fevers, pain, or vomiting.  Past Medical History:  Diagnosis Date  . Diabetes mellitus   . Fatty liver   . GERD (gastroesophageal reflux disease)   . HTN (hypertension)   . Hyperlipidemia     Patient Active Problem List   Diagnosis Date Noted  . Mixed hyperlipidemia 08/27/2015  . Vitamin D deficiency 08/27/2015  . Uncontrolled type 2 diabetes mellitus with stage 3 chronic kidney disease, without long-term current use of insulin (HCC) 02/20/2014  . Severe protein-calorie malnutrition (HCC) 02/20/2014  . Fatty liver 09/21/2012  . Weight loss 09/21/2012  . Sternum pain 05/20/2011  . Screen for colon cancer 05/20/2011  . RUQ pain 01/07/2011  . Essential hypertension 01/07/2011    Past Surgical History:  Procedure Laterality Date  . BACK SURGERY    . COLONOSCOPY  01/2007   Dr. Lovell SheehanJenkins, reviewed report, mild diverticulosis. Prep adequate. Next TCS due 01/2017  . ESOPHAGOGASTRODUODENOSCOPY N/A 10/21/2012   [id number]:MODERATE GASTRITIS  . MASS EXCISION Right 10/26/2012   Procedure: EXCISION MASS;  Surgeon: Dalia HeadingMark A Jenkins, MD;  Location: [facility];  Service: General;  Laterality: Right;  . scalp mass  4-5 yrs ago   Dr Lovell SheehanJenkins       Home Medications    Prior to Admission medications   Medication Sig Start Date End Date Taking?  Authorizing Provider  acetaminophen (TYLENOL) 500 MG tablet Take 500 mg by mouth every 6 (six) hours as needed for moderate pain.   Yes [provider]  amLODipine (NORVASC) 10 MG tablet Take 1 tablet (10 mg total) by mouth daily. 10/03/15  Yes Roma KayserNida, Gebreselassie W, MD  aspirin EC 81 MG tablet Take 81 mg by mouth daily.   Yes [provider]  Cholecalciferol (VITAMIN D3) 5000 units CAPS Take 1 capsule (5,000 Units total) by mouth daily. 11/24/16  Yes Nida, Denman GeorgeGebreselassie W, MD  cloNIDine (CATAPRES) 0.3 MG tablet Take 0.3 mg by mouth at bedtime.   Yes [provider]  Insulin Glargine (TOUJEO SOLOSTAR) 300 UNIT/ML SOPN Inject 15 Units into the skin daily with breakfast. 09/29/16  Yes Nida, Denman GeorgeGebreselassie W, MD  Insulin Pen Needle (PEN NEEDLES) 31G X 5 MM MISC 1 each by Does not apply route at bedtime. 12/24/16  Yes Nida, Denman GeorgeGebreselassie W, MD  losartan (COZAAR) 100 MG tablet Take 100 mg by mouth daily.   Yes [provider]  omeprazole (PRILOSEC) 20 MG capsule Take 1 capsule (20 mg total) by mouth daily. 09/21/12  Yes Joselyn ArrowJones, Kandice L, NP  methocarbamol (ROBAXIN) 500 MG tablet Take 1 tablet (500 mg total) by mouth 3 (three) times daily. Patient not taking: Reported on 08/25/2017 08/12/17   Triplett, Tammy, PA-C  predniSONE (DELTASONE) 10 MG tablet Take 6 tablets day one, 5 tablets  day two, 4 tablets day three, 3 tablets day four, 2 tablets day five, then 1 tablet day six Patient not taking: Reported on 08/25/2017 08/12/17   Pauline Ausriplett, Tammy, PA-C    Family History Family History  Problem Relation Age of Onset  . Diabetes Mother   . Diabetes Father   . Diabetes Brother   . Colon cancer Neg Hx   . Liver disease Neg Hx     Social History Social History   Tobacco Use  . Smoking status: Current Every Day Smoker    Packs/day: 0.50    Years: 25.00    Pack years: 12.50    Types: Cigarettes  . Smokeless tobacco: Never Used  Substance Use Topics  . Alcohol use: No      Comment: hx heavy etoh x 3 yrs, quit 30+ yrs ago  . Drug use: No     Allergies   Patient has no known allergies.   Review of Systems Review of Systems  Constitutional: Positive for fatigue. Negative for fever.  Respiratory: Negative for shortness of breath.   Cardiovascular: Negative for chest pain.  Gastrointestinal: Negative for abdominal pain.  All other systems reviewed and are negative.    Physical Exam Updated Vital Signs BP (!) 160/88 (BP Location: Left Arm)   Pulse 86   Temp 97.8 F (36.6 C) (Oral)   Resp 18   Ht 5' 7.5" (1.715 m)   Wt 62.6 kg (138 lb)   SpO2 100%   BMI 21.29 kg/m   Physical Exam  Constitutional: He is oriented to person, place, and time. He appears well-developed and well-nourished. No distress.  HENT:  Head: Normocephalic and atraumatic.  Right Ear: External ear normal.  Left Ear: External ear normal.  Nose: Nose normal.  Eyes: Right eye exhibits no discharge. Left eye exhibits no discharge.  Neck: Neck supple.  Cardiovascular: Normal rate, regular rhythm and normal heart sounds.  Pulmonary/Chest: Effort normal and breath sounds normal.  Abdominal: Soft. There is no tenderness.  Musculoskeletal: He exhibits no edema.  Neurological: He is alert and oriented to person, place, and time.  Skin: Skin is warm and dry. He is not diaphoretic.  Nursing note and vitals reviewed.    ED Treatments / Results  Labs (all labs ordered are listed, but only abnormal results are displayed) Labs Reviewed  BASIC METABOLIC PANEL - Abnormal; Notable for the following components:      Result Value   Sodium 130 (*)    Chloride 96 (*)    Glucose, Bld 456 (*)    Creatinine, Ser 1.67 (*)    GFR calc non Af Amer 41 (*)    GFR calc Af Amer 48 (*)    All other components within normal limits  URINALYSIS, ROUTINE W REFLEX MICROSCOPIC - Abnormal; Notable for the following components:   Color, Urine STRAW (*)    Glucose, UA >=500 (*)    Squamous  Epithelial / LPF 0-5 (*)    All other components within normal limits  CBG MONITORING, ED - Abnormal; Notable for the following components:   Glucose-Capillary 458 (*)    All other components within normal limits  CBG MONITORING, ED - Abnormal; Notable for the following components:   Glucose-Capillary 299 (*)    All other components within normal limits  CBC    EKG  EKG Interpretation None       Radiology No results found.  Procedures Procedures (including critical care time)  Medications Ordered in ED Medications  sodium chloride 0.9 % bolus 1,000 mL (0 mLs Intravenous Stopped 08/25/17 1534)  sodium chloride 0.9 % bolus 1,000 mL (0 mLs Intravenous Stopped 08/25/17 1405)     Initial Impression / Assessment and Plan / ED Course  I have reviewed the triage vital signs and the nursing notes.  Pertinent labs & imaging results that were available during my care of the patient were reviewed by me and considered in my medical decision making (see chart for details).     Hyperglycemia is likely secondary to recent steroid use.  However he does not have evidence of DKA or other significant electrolyte disturbance.  Renal function is at baseline.  Glucose is coming down with IV fluids and he feels better.  Instructed to continue using his insulin and call his PCP for further outpatient diabetes management.  He already has an appointment early next week. Return precautions  Final Clinical Impressions(s) / ED Diagnoses   Final diagnoses:  Hyperglycemia    ED Discharge Orders    None       Pricilla Loveless, MD 08/25/17 1601

## 2017-08-25 NOTE — ED Notes (Signed)
Pt c/o elevated blood sugar for the past week or so, reports that he was seen in er recently, given an ?steroid shot and sent home with prescription for prednisone, pt states that he blood sugar has increased steadily since then,

## 2017-08-27 ENCOUNTER — Encounter: Payer: Self-pay | Admitting: "Endocrinology

## 2017-08-31 ENCOUNTER — Encounter: Payer: Self-pay | Admitting: "Endocrinology

## 2017-08-31 ENCOUNTER — Ambulatory Visit (INDEPENDENT_AMBULATORY_CARE_PROVIDER_SITE_OTHER): Payer: Medicare Other | Admitting: "Endocrinology

## 2017-08-31 VITALS — BP 180/88 | HR 76 | Ht 67.0 in | Wt 136.0 lb

## 2017-08-31 DIAGNOSIS — F17200 Nicotine dependence, unspecified, uncomplicated: Secondary | ICD-10-CM | POA: Diagnosis not present

## 2017-08-31 DIAGNOSIS — E1165 Type 2 diabetes mellitus with hyperglycemia: Secondary | ICD-10-CM

## 2017-08-31 DIAGNOSIS — I10 Essential (primary) hypertension: Secondary | ICD-10-CM | POA: Diagnosis not present

## 2017-08-31 DIAGNOSIS — N183 Chronic kidney disease, stage 3 (moderate): Secondary | ICD-10-CM | POA: Diagnosis not present

## 2017-08-31 DIAGNOSIS — E782 Mixed hyperlipidemia: Secondary | ICD-10-CM

## 2017-08-31 DIAGNOSIS — IMO0002 Reserved for concepts with insufficient information to code with codable children: Secondary | ICD-10-CM

## 2017-08-31 DIAGNOSIS — E1122 Type 2 diabetes mellitus with diabetic chronic kidney disease: Secondary | ICD-10-CM | POA: Diagnosis not present

## 2017-08-31 NOTE — Progress Notes (Signed)
Subjective:    Patient ID: Chase Briggs, male    DOB: Feb 07, 1951, [facility]   Past Medical History:  Diagnosis Date  . Diabetes mellitus   . Fatty liver   . GERD (gastroesophageal reflux disease)   . HTN (hypertension)   . Hyperlipidemia    Past Surgical History:  Procedure Laterality Date  . BACK SURGERY    . COLONOSCOPY  01/2007   Dr. Lovell SheehanJenkins, reviewed report, mild diverticulosis. Prep adequate. Next TCS due 01/2017  . ESOPHAGOGASTRODUODENOSCOPY N/A 10/21/2012   ZOX:[id number]:MODERATE GASTRITIS  . MASS EXCISION Right 10/26/2012   Procedure: EXCISION MASS;  Surgeon: Dalia HeadingMark A Jenkins, MD;  Location: [facility];  Service: General;  Laterality: Right;  . scalp mass  4-5 yrs ago   Dr Lovell SheehanJenkins   Social History   Socioeconomic History  . Marital status: Married    Spouse name: None  . Number of children: 1  . Years of education: None  . Highest education level: None  Social Needs  . Financial resource strain: None  . Food insecurity - worry: None  . Food insecurity - inability: None  . Transportation needs - medical: None  . Transportation needs - non-medical: None  Occupational History    Comment: [employer], heavy lifting  Tobacco Use  . Smoking status: Current Every Day Smoker    Packs/day: 0.50    Years: 25.00    Pack years: 12.50    Types: Cigarettes  . Smokeless tobacco: Never Used  Substance and Sexual Activity  . Alcohol use: No    Comment: hx heavy etoh x 3 yrs, quit 30+ yrs ago  . Drug use: No  . Sexual activity: Yes    Birth control/protection: None  Other Topics Concern  . None  Social History Narrative  . None   Outpatient Encounter Medications as of 08/31/2017  Medication Sig  . acetaminophen (TYLENOL) 500 MG tablet Take 500 mg by mouth every 6 (six) hours as needed for moderate pain.  Marland Kitchen. amLODipine (NORVASC) 10 MG tablet Take 1 tablet (10 mg total) by mouth daily.  Marland Kitchen. aspirin EC 81 MG tablet Take 81 mg by mouth daily.  .  Cholecalciferol (VITAMIN D3) 5000 units CAPS Take 1 capsule (5,000 Units total) by mouth daily.  . cloNIDine (CATAPRES) 0.3 MG tablet Take 0.3 mg by mouth at bedtime.  . Insulin Glargine (TOUJEO SOLOSTAR) 300 UNIT/ML SOPN Inject 15 Units into the skin daily with breakfast.  . Insulin Pen Needle (PEN NEEDLES) 31G X 5 MM MISC 1 each by Does not apply route at bedtime.  Marland Kitchen. losartan (COZAAR) 100 MG tablet Take 100 mg by mouth daily.  . methocarbamol (ROBAXIN) 500 MG tablet Take 1 tablet (500 mg total) by mouth 3 (three) times daily. (Patient not taking: Reported on 08/25/2017)  . omeprazole (PRILOSEC) 20 MG capsule Take 1 capsule (20 mg total) by mouth daily.  . predniSONE (DELTASONE) 10 MG tablet Take 6 tablets day one, 5 tablets day two, 4 tablets day three, 3 tablets day four, 2 tablets day five, then 1 tablet day six (Patient not taking: Reported on 08/25/2017)   No facility-administered encounter medications on file as of 08/31/2017.    ALLERGIES: No Known Allergies VACCINATION STATUS:  There is no immunization history on file for this patient.  Diabetes  He presents for his follow-up diabetic visit. He has type 2 diabetes mellitus. Onset time: He was diagnosed at approximate age of 35 years. His  disease course has been worsening (He did have oral and intra-articular steroid exposure on 2 separate occasions since his last visit as related to back pain.). There are no hypoglycemic associated symptoms. Pertinent negatives for hypoglycemia include no confusion, headaches, pallor or seizures. There are no diabetic associated symptoms. Pertinent negatives for diabetes include no chest pain, no fatigue, no polydipsia, no polyphagia, no polyuria and no weakness. There are no hypoglycemic complications. Symptoms are worsening. Risk factors for coronary artery disease include diabetes mellitus, dyslipidemia, hypertension, male sex and sedentary lifestyle. He is compliant with treatment most of the time. His  weight is stable. He is following a generally unhealthy diet. When asked about meal planning, he reported none. He has not had a previous visit with a dietitian. His breakfast blood glucose range is generally 140-180 mg/dl. His dinner blood glucose range is generally 180-200 mg/dl. His overall blood glucose range is 180-200 mg/dl. An ACE inhibitor/angiotensin II receptor blocker is being taken. Eye exam is current.  Hypertension  This is a chronic problem. The current episode started more than 1 year ago. The problem is uncontrolled. Pertinent negatives include no chest pain, headaches, neck pain, palpitations or shortness of breath. Risk factors for coronary artery disease include smoking/tobacco exposure. Past treatments include ACE inhibitors, calcium channel blockers and central alpha agonists. The current treatment provides no improvement. Compliance problems include psychosocial issues.   Hyperlipidemia  This is a chronic problem. The current episode started more than 1 year ago. Pertinent negatives include no chest pain, myalgias or shortness of breath. Current antihyperlipidemic treatment includes statins. Risk factors for coronary artery disease include dyslipidemia, diabetes mellitus, male sex, hypertension and a sedentary lifestyle.     Review of Systems  Constitutional: Negative for fatigue and unexpected weight change.  HENT: Negative for dental problem, mouth sores and trouble swallowing.   Eyes: Negative for visual disturbance.  Respiratory: Negative for cough, choking, chest tightness, shortness of breath and wheezing.   Cardiovascular: Negative for chest pain, palpitations and leg swelling.  Gastrointestinal: Negative for abdominal distention, abdominal pain, constipation, diarrhea, nausea and vomiting.  Endocrine: Negative for polydipsia, polyphagia and polyuria.  Genitourinary: Negative for dysuria, flank pain, hematuria and urgency.  Musculoskeletal: Negative for back pain, gait  problem, myalgias and neck pain.  Skin: Negative for pallor, rash and wound.  Neurological: Negative for seizures, syncope, weakness, numbness and headaches.  Psychiatric/Behavioral: Negative.  Negative for confusion and dysphoric mood.    Objective:    BP (!) 180/88   Pulse 76   Ht 5\' 7"  (1.702 m)   Wt 136 lb (61.7 kg)   BMI 21.30 kg/m   Wt Readings from Last 3 Encounters:  08/31/17 136 lb (61.7 kg)  08/25/17 138 lb (62.6 kg)  08/12/17 138 lb (62.6 kg)    Physical Exam  Constitutional: He is oriented to person, place, and time. He appears well-developed and well-nourished. He is cooperative. No distress.  HENT:  Head: Normocephalic and atraumatic.  Eyes: EOM are normal.  Neck: Normal range of motion. Neck supple. No tracheal deviation present. No thyromegaly present.  Cardiovascular: Normal rate, S1 normal, S2 normal and normal heart sounds. Exam reveals no gallop.  No murmur heard. Pulses:      Dorsalis pedis pulses are 1+ on the right side, and 1+ on the left side.       Posterior tibial pulses are 1+ on the right side, and 1+ on the left side.  Pulmonary/Chest: Breath sounds normal. No respiratory distress. He  has no wheezes.  Abdominal: Soft. Bowel sounds are normal. He exhibits no distension. There is no tenderness. There is no guarding and no CVA tenderness.  Musculoskeletal: He exhibits no edema.       Right shoulder: He exhibits no swelling and no deformity.  Neurological: He is alert and oriented to person, place, and time. He has normal strength and normal reflexes. No cranial nerve deficit or sensory deficit. Gait normal.  Skin: Skin is warm and dry. No rash noted. No cyanosis. Nails show no clubbing.  Psychiatric: He has a normal mood and affect. His speech is normal and behavior is normal. Judgment and thought content normal. Cognition and memory are normal.     Complete Blood Count (Most recent): Lab Results  Component Value Date   WBC 7.7 08/25/2017   HGB  14.6 08/25/2017   HCT 44.1 08/25/2017   MCV 96.3 08/25/2017   PLT 201 08/25/2017   Chemistry (most recent): Lab Results  Component Value Date   NA 130 (L) 08/25/2017   K 4.4 08/25/2017   CL 96 (L) 08/25/2017   CO2 24 08/25/2017   BUN 20 08/25/2017   CREATININE 1.67 (H) 08/25/2017   Diabetic Labs (most recent): Lab Results  Component Value Date   HGBA1C 8.5 (H) 08/20/2017   HGBA1C 6.9 (H) 05/24/2017   HGBA1C 5.5 02/15/2017    Assessment & Plan:   1. Type 2 diabetes mellitus without complication, without long-term current use of insulin (HCC)  -He had hyperosmolar state in the past, patient remains at a high risk for more acute and chronic complications of diabetes which include CAD, CVA, CKD, retinopathy, and neuropathy. These are all discussed in detail with the patient.  Patient came with loss of control with A1c of 8.5% from 6.9%, after generally improving from 12.6%. - This is due to his exposure to steroids on multiple occasions since last visit related to his back pain.   Recent labs reviewed, state 3 renal insufficiency. - I have re-counseled the patient on diet management  by adopting a carbohydrate restricted / protein rich  Diet.  -  Suggestion is made for him to avoid simple carbohydrates  from his diet including Cakes, Sweet Desserts / Pastries, Ice Cream, Soda (diet and regular), Sweet Tea, Candies, Chips, Cookies, Store Bought Juices, Alcohol in Excess of  1-2 drinks a day, Artificial Sweeteners, and "Sugar-free" Products. This will help patient to have stable blood glucose profile and potentially avoid unintended weight gain.   - Patient is advised to stick to a routine mealtimes to eat 3 meals  a day and avoid unnecessary snacks ( to snack only to correct hypoglycemia).  - I have approached patient with the following individualized plan to manage diabetes and patient agrees.  - He has benefited from insulin initiation.   - I   advised him to increase his  basal insulin Toujeo to 20 units  Every morning with breakfast , continue strict  monitoring blood glucose daily  before breakfast and at bedtime and PRN . He will call clinic if he registers greater than 200 mg/dL.  -   he has stage 3 renal insufficiency , will stay off of metformin for now. If his renal insufficiency continues, he will need consultation with nephrology.  -Patient is encouraged to call clinic for blood glucose levels less than 70 or above 300 mg /dl.  - Patient is not a candidate for  incretin therapy . - Patient specific target  for A1c; LDL,  HDL, Triglycerides, and  Waist Circumference were discussed in detail.  2) BP/HTN:  Uncontrolled. He has 3 medications including losartan, clonidine, and amlodipine. He is not consistent taking his blood pressure medications. I urged him to start his medications as prescribed.   He is a chronic heavy smoker, stating blood pressure control, re-counseled him about smoking cessation.   3) Lipids/HPL:  continue statins. 4)  Weight/Diet: He has regained 16 lbs  overall and this is a good development for him.  - He would benefit from addition of more complex carbohydrates, information provided. CDE consult is requested.  5. Vitamin D deficiency - His insurance would not allow for 25 hydroxy vitamin D recheck. - He will continue with vitamin D3 5000 units daily for 90 days.   6) Chronic Care/Health Maintenance:  -Patient is on ACEI/ARB and Statin medications and encouraged to continue to follow up with Ophthalmology, Podiatrist at least yearly or according to recommendations, and advised to  Quit  smoking. I have recommended yearly flu vaccine and pneumonia vaccination at least every 5 years; moderate intensity exercise for up to 150 minutes weekly; and  sleep for at least 7 hours a day.  - I advised patient to maintain close follow up with [facility] for primary care needs. - I have advised him to avoid  over-the-counter pain medications to avoid additional risk for renal insufficiency.  - Time spent with the patient: 25 min, of which >50% was spent in reviewing his sugar logs , discussing his hypo- and hyper-glycemic episodes, reviewing his current and  previous labs and insulin doses and developing a plan to avoid hypo- and hyper-glycemia.   Follow up plan: -Return in about 3 months (around 11/29/2017) for follow up with pre-visit labs, meter, and logs.  Marquis Lunch, MD Phone: [phone]  Fax: [phone]  This note was partially dictated with voice recognition software. Similar sounding words can be transcribed inadequately or may not  be corrected upon review.  08/31/2017, 9:15 AM

## 2017-10-06 DIAGNOSIS — E119 Type 2 diabetes mellitus without complications: Secondary | ICD-10-CM | POA: Diagnosis not present

## 2017-10-06 DIAGNOSIS — I10 Essential (primary) hypertension: Secondary | ICD-10-CM | POA: Diagnosis not present

## 2017-10-06 DIAGNOSIS — F419 Anxiety disorder, unspecified: Secondary | ICD-10-CM | POA: Diagnosis not present

## 2017-10-14 ENCOUNTER — Telehealth: Payer: Self-pay

## 2017-10-14 MED ORDER — PEN NEEDLES 32G X 4 MM MISC: 1.0000 | Freq: Every day | 2 refills | Status: DC

## 2017-10-14 NOTE — Telephone Encounter (Signed)
Pt needs refill on Sure comfort pen 32 gx 4mm

## 2017-10-20 DIAGNOSIS — D649 Anemia, unspecified: Secondary | ICD-10-CM | POA: Diagnosis not present

## 2017-10-20 DIAGNOSIS — F419 Anxiety disorder, unspecified: Secondary | ICD-10-CM | POA: Diagnosis not present

## 2017-10-20 DIAGNOSIS — E1165 Type 2 diabetes mellitus with hyperglycemia: Secondary | ICD-10-CM | POA: Diagnosis not present

## 2017-10-27 ENCOUNTER — Emergency Department (HOSPITAL_COMMUNITY)
Admission: EM | Admit: 2017-10-27 | Discharge: 2017-10-27 | Disposition: A | Discharge status: Home / Self Care | Payer: Medicare Other | Attending: Emergency Medicine | Admitting: Emergency Medicine

## 2017-10-27 ENCOUNTER — Encounter (HOSPITAL_COMMUNITY): Payer: Self-pay | Admitting: Emergency Medicine

## 2017-10-27 ENCOUNTER — Other Ambulatory Visit: Payer: Self-pay

## 2017-10-27 DIAGNOSIS — Z794 Long term (current) use of insulin: Secondary | ICD-10-CM | POA: Insufficient documentation

## 2017-10-27 DIAGNOSIS — Z79899 Other long term (current) drug therapy: Secondary | ICD-10-CM | POA: Insufficient documentation

## 2017-10-27 DIAGNOSIS — Z7982 Long term (current) use of aspirin: Secondary | ICD-10-CM | POA: Insufficient documentation

## 2017-10-27 DIAGNOSIS — R197 Diarrhea, unspecified: Secondary | ICD-10-CM

## 2017-10-27 DIAGNOSIS — R110 Nausea: Secondary | ICD-10-CM | POA: Diagnosis not present

## 2017-10-27 DIAGNOSIS — F17210 Nicotine dependence, cigarettes, uncomplicated: Secondary | ICD-10-CM | POA: Insufficient documentation

## 2017-10-27 DIAGNOSIS — E1122 Type 2 diabetes mellitus with diabetic chronic kidney disease: Secondary | ICD-10-CM | POA: Diagnosis not present

## 2017-10-27 DIAGNOSIS — N183 Chronic kidney disease, stage 3 (moderate): Secondary | ICD-10-CM | POA: Diagnosis not present

## 2017-10-27 DIAGNOSIS — I129 Hypertensive chronic kidney disease with stage 1 through stage 4 chronic kidney disease, or unspecified chronic kidney disease: Secondary | ICD-10-CM | POA: Insufficient documentation

## 2017-10-27 DIAGNOSIS — R630 Anorexia: Secondary | ICD-10-CM | POA: Diagnosis not present

## 2017-10-27 LAB — CBG MONITORING, ED: GLUCOSE-CAPILLARY: 189 mg/dL — AB (ref 65–99)

## 2017-10-27 LAB — CBC WITH DIFFERENTIAL/PLATELET
BASOS PCT: 1 %
Basophils Absolute: 0.1 10*3/uL (ref 0.0–0.1)
EOS ABS: 0 10*3/uL (ref 0.0–0.7)
Eosinophils Relative: 0 %
HCT: 43.3 % (ref 39.0–52.0)
Hemoglobin: 14.4 g/dL (ref 13.0–17.0)
Lymphocytes Relative: 35 %
Lymphs Abs: 1.7 10*3/uL (ref 0.7–4.0)
MCH: 31.2 pg (ref 26.0–34.0)
MCHC: 33.3 g/dL (ref 30.0–36.0)
MCV: 93.9 fL (ref 78.0–100.0)
MONO ABS: 0.7 10*3/uL (ref 0.1–1.0)
MONOS PCT: 15 %
Neutro Abs: 2.5 10*3/uL (ref 1.7–7.7)
Neutrophils Relative %: 49 %
Platelets: 124 10*3/uL — ABNORMAL LOW (ref 150–400)
RBC: 4.61 MIL/uL (ref 4.22–5.81)
RDW: 12.9 % (ref 11.5–15.5)
WBC: 5 10*3/uL (ref 4.0–10.5)

## 2017-10-27 LAB — URINALYSIS, ROUTINE W REFLEX MICROSCOPIC
Bacteria, UA: NONE SEEN
Bilirubin Urine: NEGATIVE
Glucose, UA: NEGATIVE mg/dL
Ketones, ur: 5 mg/dL — AB
Leukocytes, UA: NEGATIVE
Nitrite: NEGATIVE
Protein, ur: 30 mg/dL — AB
RBC / HPF: NONE SEEN RBC/hpf (ref 0–5)
Specific Gravity, Urine: 1.013 (ref 1.005–1.030)
pH: 6 (ref 5.0–8.0)

## 2017-10-27 LAB — COMPREHENSIVE METABOLIC PANEL
ALBUMIN: 4.2 g/dL (ref 3.5–5.0)
ALK PHOS: 69 U/L (ref 38–126)
ALT: 37 U/L (ref 17–63)
AST: 44 U/L — ABNORMAL HIGH (ref 15–41)
Anion gap: 14 (ref 5–15)
BUN: 31 mg/dL — AB (ref 6–20)
CALCIUM: 9.3 mg/dL (ref 8.9–10.3)
CO2: 24 mmol/L (ref 22–32)
CREATININE: 2.09 mg/dL — AB (ref 0.61–1.24)
Chloride: 92 mmol/L — ABNORMAL LOW (ref 101–111)
GFR calc Af Amer: 36 mL/min — ABNORMAL LOW (ref >60)
GFR calc non Af Amer: 31 mL/min — ABNORMAL LOW (ref >60)
GLUCOSE: 164 mg/dL — AB (ref 65–99)
Potassium: 4.2 mmol/L (ref 3.5–5.1)
SODIUM: 130 mmol/L — AB (ref 135–145)
Total Bilirubin: 0.5 mg/dL (ref 0.3–1.2)
Total Protein: 8.2 g/dL — ABNORMAL HIGH (ref 6.5–8.1)

## 2017-10-27 LAB — I-STAT CREATININE, ED: Creatinine, Ser: 1.7 mg/dL — ABNORMAL HIGH (ref 0.61–1.24)

## 2017-10-27 LAB — LIPASE, BLOOD: Lipase: 27 U/L (ref 11–51)

## 2017-10-27 MED ORDER — ONDANSETRON HCL 4 MG PO TABS: 4.0000 mg | ORAL_TABLET | Freq: Three times a day (TID) | ORAL | 0 refills | Status: DC | PRN

## 2017-10-27 MED ORDER — SODIUM CHLORIDE 0.9 % IV BOLUS (SEPSIS)
500.0000 mL | Freq: Once | INTRAVENOUS | Status: AC
  Administered 2017-10-27: 500 mL via INTRAVENOUS

## 2017-10-27 MED ORDER — SODIUM CHLORIDE 0.9 % IV BOLUS (SEPSIS)
1000.0000 mL | Freq: Once | INTRAVENOUS | Status: AC
  Administered 2017-10-27: 1000 mL via INTRAVENOUS

## 2017-10-27 NOTE — ED Triage Notes (Signed)
Pt c/o diarrhea and no appetite x 3 days. Denies pain or n/v. Nad. C/o gen weakness

## 2017-10-27 NOTE — ED Notes (Signed)
Pt has urinated x2. Has had no diarrhea since in ED. Drinking fluids well

## 2017-10-27 NOTE — ED Provider Notes (Signed)
[facility]Y [facility]PART[facility] Provider Note   CSN: [id number] Arrival date & time: 10/27/17  0759     History   Chief Complaint Chief Complaint  Patient presents with  . Diarrhea    HPI Chase Briggs is a 67 y.o. male with history of DM, fatty liver, GERD, HTN, HLD presents today for evaluation of acute onset of anorexia for 3 days.  He states that he developed nausea as well but not denies vomiting.  Notes that yesterday he developed diarrhea "all night long".  Stools were watery and nonbloody.  He feels generally weak.  He denies abdominal pain, chest pain, shortness of breath.  He denies fevers but endorses chills.  Has not tried anything for his symptoms.  No known sick contacts.  He denies suspicious food intake or recent treatment with antibiotics.  The history is provided by the patient.    Past Medical History:  Diagnosis Date  . Diabetes mellitus   . Fatty liver   . GERD (gastroesophageal reflux disease)   . HTN (hypertension)   . Hyperlipidemia     Patient Active Problem List   Diagnosis Date Noted  . Current smoker 08/31/2017  . Mixed hyperlipidemia 08/27/2015  . Vitamin D deficiency 08/27/2015  . Uncontrolled type 2 diabetes mellitus with stage 3 chronic kidney disease, without long-term current use of insulin (HCC) 02/20/2014  . Severe protein-calorie malnutrition (HCC) 02/20/2014  . Fatty liver 09/21/2012  . Weight loss 09/21/2012  . Sternum pain 05/20/2011  . Screen for colon cancer 05/20/2011  . RUQ pain 01/07/2011  . Essential hypertension 01/07/2011    Past Surgical History:  Procedure Laterality Date  . BACK SURGERY    . COLONOSCOPY  01/2007   Dr. Lovell SheehanJenkins, reviewed report, mild diverticulosis. Prep adequate. Next TCS due 01/2017  . ESOPHAGOGASTRODUODENOSCOPY N/A 10/21/2012   XLK:[id number]:MODERATE GASTRITIS  . MASS EXCISION Right 10/26/2012   Procedure: EXCISION MASS;  Surgeon: Dalia HeadingMark A Jenkins, MD;  Location: [facility];  Service: General;  Laterality:  Right;  . scalp mass  4-5 yrs ago   Dr Lovell SheehanJenkins       Home Medications    Prior to Admission medications   Medication Sig Start Date End Date Taking? Authorizing Provider  acetaminophen (TYLENOL) 500 MG tablet Take 500 mg by mouth every 6 (six) hours as needed for moderate pain.   Yes [provider]  amLODipine (NORVASC) 10 MG tablet Take 1 tablet (10 mg total) by mouth daily. 10/03/15  Yes Roma KayserNida, Gebreselassie W, MD  aspirin EC 81 MG tablet Take 81 mg by mouth daily.   Yes [provider]  cloNIDine (CATAPRES) 0.3 MG tablet Take 0.3 mg by mouth at bedtime.   Yes [provider]  FLUoxetine (PROZAC) 10 MG capsule Take 10 mg by mouth daily. 10/06/17  Yes [provider]  Insulin Glargine (TOUJEO SOLOSTAR) 300 UNIT/ML SOPN Inject 15 Units into the skin daily with breakfast. Patient taking differently: Inject 20 Units into the skin daily with breakfast.  09/29/16  Yes Nida, Denman GeorgeGebreselassie W, MD  lisinopril-hydrochlorothiazide (PRINZIDE,ZESTORETIC) 10-12.5 MG tablet Take 1 tablet by mouth daily. 10/06/17  Yes [provider]  Insulin Pen Needle (PEN NEEDLES) 32G X 4 MM MISC 1 each by Does not apply route at bedtime. 10/14/17   Roma KayserNida, Gebreselassie W, MD  ondansetron (ZOFRAN) 4 MG tablet Take 1 tablet (4 mg total) by mouth every 8 (eight) hours as needed for nausea or vomiting. 10/27/17   Jeanie SewerFawze, Quintel Mccalla A, PA-C  Family History Family History  Problem Relation Age of Onset  . Diabetes Mother   . Diabetes Father   . Diabetes Brother   . Colon cancer Neg Hx   . Liver disease Neg Hx     Social History Social History   Tobacco Use  . Smoking status: Current Every Day Smoker    Packs/day: 0.50    Years: 25.00    Pack years: 12.50    Types: Cigarettes  . Smokeless tobacco: Never Used  Substance Use Topics  . Alcohol use: No    Comment: hx heavy etoh x 3 yrs, quit 30+ yrs ago  . Drug use: No     Allergies   Patient has no known  allergies.   Review of Systems Review of Systems  Constitutional: Positive for appetite change and chills. Negative for fever.  Respiratory: Negative for shortness of breath.   Cardiovascular: Negative for chest pain.  Gastrointestinal: Positive for diarrhea and nausea. Negative for abdominal pain, blood in stool, constipation and vomiting.  Genitourinary: Negative for dysuria, frequency, hematuria and urgency.  All other systems reviewed and are negative.    Physical Exam Updated Vital Signs BP (!) 144/73 (BP Location: Left Arm)   Pulse 75   Temp 98.3 F (36.8 C) (Oral)   Resp 17   SpO2 100%   Physical Exam  Constitutional: He appears well-developed and well-nourished. No distress.  Thin, resting in bed in no apparent distress  HENT:  Head: Normocephalic and atraumatic.  Eyes: Conjunctivae are normal. Right eye exhibits no discharge. Left eye exhibits no discharge.  Neck: Normal range of motion. Neck supple. No JVD present. No tracheal deviation present.  Cardiovascular: Normal rate, regular rhythm and normal heart sounds.  Pulmonary/Chest: Effort normal and breath sounds normal.  Abdominal: Soft. Bowel sounds are normal. He exhibits no distension. There is no tenderness.  Musculoskeletal: He exhibits no edema.  Neurological: He is alert.  Skin: Skin is warm and dry. No erythema.  Psychiatric: He has a normal mood and affect. His behavior is normal.  Nursing note and vitals reviewed.    ED Treatments / Results  Labs (all labs ordered are listed, but only abnormal results are displayed) Labs Reviewed  COMPREHENSIVE METABOLIC PANEL - Abnormal; Notable for the following components:      Result Value   Sodium 130 (*)    Chloride 92 (*)    Glucose, Bld 164 (*)    BUN 31 (*)    Creatinine, Ser 2.09 (*)    Total Protein 8.2 (*)    AST 44 (*)    GFR calc non Af Amer 31 (*)    GFR calc Af Amer 36 (*)    All other components within normal limits  CBC WITH  DIFFERENTIAL/PLATELET - Abnormal; Notable for the following components:   Platelets 124 (*)    All other components within normal limits  URINALYSIS, ROUTINE W REFLEX MICROSCOPIC - Abnormal; Notable for the following components:   Hgb urine dipstick SMALL (*)    Ketones, ur 5 (*)    Protein, ur 30 (*)    Squamous Epithelial / LPF 0-5 (*)    All other components within normal limits  CBG MONITORING, ED - Abnormal; Notable for the following components:   Glucose-Capillary 189 (*)    All other components within normal limits  I-STAT CREATININE, ED - Abnormal; Notable for the following components:   Creatinine, Ser 1.70 (*)    All other components within normal limits  LIPASE, BLOOD    EKG  EKG Interpretation None       Radiology No results found.  Procedures Procedures (including critical care time)  Medications Ordered in ED Medications  sodium chloride 0.9 % bolus 500 mL (0 mLs Intravenous Stopped 10/27/17 1545)  sodium chloride 0.9 % bolus 1,000 mL (0 mLs Intravenous Stopped 10/27/17 1418)     Initial Impression / Assessment and Plan / ED Course  I have reviewed the triage vital signs and the nursing notes.  Pertinent labs & imaging results that were available during my care of the patient were reviewed by me and considered in my medical decision making (see chart for details).     Patient presents with 3 days of decreased appetite, nausea, and diarrhea.  Afebrile, vital signs are at patient's baseline.  He is thin but nontoxic in appearance.  No episodes of diarrhea or emesis while in the ED.  Abdominal examination is entirely benign.  No leukocytosis or anemia, however lab work does show changes consistent with dehydration.  He does have an elevation in his creatinine from his baseline of around 1.6 to 2.09 with acutely elevated BUN of 31.  Patient was given IV fluid rehydration, with repeat creatinine improved to 1.7.  He has been tolerating p.o. food and fluids in the  ED and has urinated multiple times without difficulty.  He states he is feeling better.  I doubt acute surgical abdominal pathology such as obstruction, perforation, appendicitis, colitis, or AAA.  Suspect likely viral process.  Recommended supportive treatment and will discharge with Zofran as needed for nausea.  Encouraged patient to follow-up with his PCP in the next few days.  Discussed indications for return to the ED. Pt verbalized understanding of and agreement with plan and is safe for discharge home at this time.  He has no complaints prior to discharge.  Final Clinical Impressions(s) / ED Diagnoses   Final diagnoses:  Diarrhea in adult patient  Nausea  Anorexia    ED Discharge Orders        Ordered    ondansetron (ZOFRAN) 4 MG tablet  Every 8 hours PRN     10/27/17 1541       Trentyn Boisclair, Marlynn Perking, PA-C 10/27/17 1706    Samuel Jester, DO 10/29/17 1301

## 2017-10-27 NOTE — Discharge Instructions (Signed)
Drink plenty of fluids and get plenty of rest.  Take Zofran as needed for nausea and vomiting.  Wait around 20 minutes before having something to eat and drink after taking this medication.  Follow-up with your primary care physician for reevaluation of your symptoms.  Return to the emergency department if any concerning signs or symptoms develop such as severe abdominal pain, fevers, not being able to keep any food or drink down, or blood in your urine or stool.  Follow-up with your primary care physician also to check your kidney function.

## 2017-10-30 ENCOUNTER — Emergency Department (HOSPITAL_COMMUNITY): Payer: Medicare Other

## 2017-10-30 ENCOUNTER — Encounter (HOSPITAL_COMMUNITY): Payer: Self-pay | Admitting: Emergency Medicine

## 2017-10-30 ENCOUNTER — Emergency Department (HOSPITAL_COMMUNITY)
Admission: EM | Admit: 2017-10-30 | Discharge: 2017-10-30 | Disposition: A | Discharge status: Home / Self Care | Payer: Medicare Other | Attending: Emergency Medicine | Admitting: Emergency Medicine

## 2017-10-30 ENCOUNTER — Other Ambulatory Visit: Payer: Self-pay

## 2017-10-30 DIAGNOSIS — R1013 Epigastric pain: Secondary | ICD-10-CM | POA: Insufficient documentation

## 2017-10-30 DIAGNOSIS — I10 Essential (primary) hypertension: Secondary | ICD-10-CM | POA: Insufficient documentation

## 2017-10-30 DIAGNOSIS — R109 Unspecified abdominal pain: Secondary | ICD-10-CM | POA: Diagnosis not present

## 2017-10-30 DIAGNOSIS — Z7982 Long term (current) use of aspirin: Secondary | ICD-10-CM | POA: Diagnosis not present

## 2017-10-30 DIAGNOSIS — F17210 Nicotine dependence, cigarettes, uncomplicated: Secondary | ICD-10-CM | POA: Diagnosis not present

## 2017-10-30 DIAGNOSIS — Z794 Long term (current) use of insulin: Secondary | ICD-10-CM | POA: Insufficient documentation

## 2017-10-30 DIAGNOSIS — Z79899 Other long term (current) drug therapy: Secondary | ICD-10-CM | POA: Diagnosis not present

## 2017-10-30 DIAGNOSIS — I700 Atherosclerosis of aorta: Secondary | ICD-10-CM

## 2017-10-30 DIAGNOSIS — E119 Type 2 diabetes mellitus without complications: Secondary | ICD-10-CM | POA: Insufficient documentation

## 2017-10-30 DIAGNOSIS — R1011 Right upper quadrant pain: Secondary | ICD-10-CM | POA: Diagnosis not present

## 2017-10-30 LAB — URINALYSIS, ROUTINE W REFLEX MICROSCOPIC
Bacteria, UA: NONE SEEN
Bilirubin Urine: NEGATIVE
GLUCOSE, UA: 50 mg/dL — AB
KETONES UR: NEGATIVE mg/dL
LEUKOCYTES UA: NEGATIVE
NITRITE: NEGATIVE
PH: 6 (ref 5.0–8.0)
Protein, ur: 30 mg/dL — AB
Specific Gravity, Urine: 1.015 (ref 1.005–1.030)

## 2017-10-30 LAB — CBC WITH DIFFERENTIAL/PLATELET
BASOS ABS: 0.1 10*3/uL (ref 0.0–0.1)
Basophils Relative: 1 %
Eosinophils Absolute: 0 10*3/uL (ref 0.0–0.7)
Eosinophils Relative: 0 %
HEMATOCRIT: 38.6 % — AB (ref 39.0–52.0)
Hemoglobin: 12.7 g/dL — ABNORMAL LOW (ref 13.0–17.0)
LYMPHS ABS: 2.1 10*3/uL (ref 0.7–4.0)
Lymphocytes Relative: 37 %
MCH: 31.2 pg (ref 26.0–34.0)
MCHC: 32.9 g/dL (ref 30.0–36.0)
MCV: 94.8 fL (ref 78.0–100.0)
MONO ABS: 0.6 10*3/uL (ref 0.1–1.0)
MONOS PCT: 11 %
NEUTROS ABS: 3 10*3/uL (ref 1.7–7.7)
Neutrophils Relative %: 51 %
Platelets: 203 10*3/uL (ref 150–400)
RBC: 4.07 MIL/uL — ABNORMAL LOW (ref 4.22–5.81)
RDW: 13 % (ref 11.5–15.5)
WBC: 5.8 10*3/uL (ref 4.0–10.5)

## 2017-10-30 LAB — COMPREHENSIVE METABOLIC PANEL
ALBUMIN: 4 g/dL (ref 3.5–5.0)
ALT: 34 U/L (ref 17–63)
ANION GAP: 13 (ref 5–15)
AST: 38 U/L (ref 15–41)
Alkaline Phosphatase: 79 U/L (ref 38–126)
BILIRUBIN TOTAL: 0.5 mg/dL (ref 0.3–1.2)
BUN: 16 mg/dL (ref 6–20)
CO2: 25 mmol/L (ref 22–32)
Calcium: 9.8 mg/dL (ref 8.9–10.3)
Chloride: 97 mmol/L — ABNORMAL LOW (ref 101–111)
Creatinine, Ser: 1.38 mg/dL — ABNORMAL HIGH (ref 0.61–1.24)
GFR calc Af Amer: 60 mL/min — ABNORMAL LOW (ref >60)
GFR, EST NON AFRICAN AMERICAN: 52 mL/min — AB (ref >60)
GLUCOSE: 206 mg/dL — AB (ref 65–99)
POTASSIUM: 4.1 mmol/L (ref 3.5–5.1)
Sodium: 135 mmol/L (ref 135–145)
TOTAL PROTEIN: 8.1 g/dL (ref 6.5–8.1)

## 2017-10-30 LAB — LIPASE, BLOOD: LIPASE: 26 U/L (ref 11–51)

## 2017-10-30 MED ORDER — IOPAMIDOL (ISOVUE-300) INJECTION 61%
100.0000 mL | Freq: Once | INTRAVENOUS | Status: AC | PRN
  Administered 2017-10-30: 100 mL via INTRAVENOUS

## 2017-10-30 MED ORDER — SODIUM CHLORIDE 0.9 % IV BOLUS (SEPSIS)
500.0000 mL | Freq: Once | INTRAVENOUS | Status: AC
  Administered 2017-10-30: 500 mL via INTRAVENOUS

## 2017-10-30 NOTE — ED Triage Notes (Signed)
Pt seen here Wednesday diarrhea. Pt states today having abd pain in mid-right side. Denies nausea/vomiting/diarrhea. Pt states did not have xray or Ct scan. Pt states feels like something is "in there."

## 2017-10-30 NOTE — ED Notes (Signed)
Patient transported to CT 

## 2017-10-30 NOTE — ED Provider Notes (Signed)
[facility] EMERGENCY DEPARTMENT Provider Note   CSN: [id number] Arrival date & time: 10/30/17  [id number]     History   Chief Complaint Chief Complaint  Patient presents with  . Abdominal Pain    HPI Osborne CascoCalvin E Muzzy is a 67 y.o. male.  HPI 67 year old male complaining of some epigastric to right upper quadrant abdominal pain.  He states he began having some diarrhea on Wednesday and had several loose bowel movements.  These were non-bloody and they were not dark.  He has not had nausea or vomiting.  The diarrhea has resolved.  Yesterday began having some right-sided abdominal pain.  He has had some similar episodes in the past.  Pain is not worsened with food intake and he has been able to eat and drink.  He does describe some decrease in appetite.  He has not noted any weight loss.  Not noted any blood in his stool.  He has not noted any night sweats. Past Medical History:  Diagnosis Date  . Diabetes mellitus   . Fatty liver   . GERD (gastroesophageal reflux disease)   . HTN (hypertension)   . Hyperlipidemia     Patient Active Problem List   Diagnosis Date Noted  . Current smoker 08/31/2017  . Mixed hyperlipidemia 08/27/2015  . Vitamin D deficiency 08/27/2015  . Uncontrolled type 2 diabetes mellitus with stage 3 chronic kidney disease, without long-term current use of insulin (HCC) 02/20/2014  . Severe protein-calorie malnutrition (HCC) 02/20/2014  . Fatty liver 09/21/2012  . Weight loss 09/21/2012  . Sternum pain 05/20/2011  . Screen for colon cancer 05/20/2011  . RUQ pain 01/07/2011  . Essential hypertension 01/07/2011    Past Surgical History:  Procedure Laterality Date  . BACK SURGERY    . COLONOSCOPY  01/2007   Dr. Lovell SheehanJenkins, reviewed report, mild diverticulosis. Prep adequate. Next TCS due 01/2017  . ESOPHAGOGASTRODUODENOSCOPY N/A 10/21/2012   JXB:[id number]:MODERATE GASTRITIS  . MASS EXCISION Right 10/26/2012   Procedure: EXCISION MASS;  Surgeon: Dalia HeadingMark A Jenkins, MD;   Location: [facility];  Service: General;  Laterality: Right;  . scalp mass  4-5 yrs ago   Dr Lovell SheehanJenkins       Home Medications    Prior to Admission medications   Medication Sig Start Date End Date Taking? Authorizing Provider  acetaminophen (TYLENOL) 500 MG tablet Take 500 mg by mouth every 6 (six) hours as needed for moderate pain.    [provider]  amLODipine (NORVASC) 10 MG tablet Take 1 tablet (10 mg total) by mouth daily. 10/03/15   Roma KayserNida, Gebreselassie W, MD  aspirin EC 81 MG tablet Take 81 mg by mouth daily.    [provider]  cloNIDine (CATAPRES) 0.3 MG tablet Take 0.3 mg by mouth at bedtime.    [provider]  FLUoxetine (PROZAC) 10 MG capsule Take 10 mg by mouth daily. 10/06/17   [provider]  Insulin Glargine (TOUJEO SOLOSTAR) 300 UNIT/ML SOPN Inject 15 Units into the skin daily with breakfast. Patient taking differently: Inject 20 Units into the skin daily with breakfast.  09/29/16   Nida, Denman GeorgeGebreselassie W, MD  Insulin Pen Needle (PEN NEEDLES) 32G X 4 MM MISC 1 each by Does not apply route at bedtime. 10/14/17   Roma KayserNida, Gebreselassie W, MD  lisinopril-hydrochlorothiazide (PRINZIDE,ZESTORETIC) 10-12.5 MG tablet Take 1 tablet by mouth daily. 10/06/17   [provider]  ondansetron (ZOFRAN) 4 MG tablet Take 1 tablet (4 mg total) by mouth every 8 (eight) hours  as needed for nausea or vomiting. 10/27/17   Jeanie Sewer, PA-C    Family History Family History  Problem Relation Age of Onset  . Diabetes Mother   . Diabetes Father   . Diabetes Brother   . Colon cancer Neg Hx   . Liver disease Neg Hx     Social History Social History   Tobacco Use  . Smoking status: Current Every Day Smoker    Packs/day: 0.50    Years: 25.00    Pack years: 12.50    Types: Cigarettes  . Smokeless tobacco: Never Used  Substance Use Topics  . Alcohol use: No    Comment: hx heavy etoh x 3 yrs, quit 30+ yrs ago  . Drug use: No     Allergies   Patient  has no known allergies.   Review of Systems Review of Systems  All other systems reviewed and are negative.    Physical Exam Updated Vital Signs BP (!) 164/64 (BP Location: Left Arm)   Pulse 83   Temp 98 F (36.7 C) (Oral)   Resp 16   Ht 1.702 m (5\' 7" )   Wt 62.6 kg (138 lb)   SpO2 98%   BMI 21.61 kg/m   Physical Exam  Constitutional: He is oriented to person, place, and time. He appears well-developed.  HENT:  Head: Normocephalic and atraumatic.  Right Ear: External ear normal.  Left Ear: External ear normal.  Nose: Nose normal.  Eyes: EOM are normal.  Neck: No tracheal deviation present.  Pulmonary/Chest: Effort normal.  Abdominal: Soft. Normal appearance, normal aorta and bowel sounds are normal. He exhibits no distension and no fluid wave. There is tenderness.  Mild diffuse tenderness to palpation without masses or rebound noted  Musculoskeletal: Normal range of motion.  Neurological: He is alert and oriented to person, place, and time.  Skin: Skin is warm and dry. Capillary refill takes less than 2 seconds.  Psychiatric: He has a normal mood and affect. His behavior is normal.  Nursing note and vitals reviewed.    ED Treatments / Results  Labs (all labs ordered are listed, but only abnormal results are displayed) Labs Reviewed  CBC WITH DIFFERENTIAL/PLATELET - Abnormal; Notable for the following components:      Result Value   RBC 4.07 (*)    Hemoglobin 12.7 (*)    HCT 38.6 (*)    All other components within normal limits  COMPREHENSIVE METABOLIC PANEL - Abnormal; Notable for the following components:   Chloride 97 (*)    Glucose, Bld 206 (*)    Creatinine, Ser 1.38 (*)    GFR calc non Af Amer 52 (*)    GFR calc Af Amer 60 (*)    All other components within normal limits  URINALYSIS, ROUTINE W REFLEX MICROSCOPIC - Abnormal; Notable for the following components:   Glucose, UA 50 (*)    Hgb urine dipstick SMALL (*)    Protein, ur 30 (*)    Squamous  Epithelial / LPF 0-5 (*)    All other components within normal limits  LIPASE, BLOOD    EKG  EKG Interpretation None       Radiology Ct Abdomen Pelvis W Contrast  Result Date: 10/30/2017 CLINICAL DATA:  Right-sided abdominal pain. EXAM: CT ABDOMEN AND PELVIS WITH CONTRAST TECHNIQUE: Multidetector CT imaging of the abdomen and pelvis was performed using the standard protocol following bolus administration of intravenous contrast. CONTRAST:  ISOVUE-300 IOPAMIDOL (ISOVUE-300) INJECTION 61% COMPARISON:  CT abdomen pelvis dated October 24, 2012. FINDINGS: Lower chest: No acute abnormality. Hepatobiliary: Focal fat along the falciform ligament. No other focal liver abnormality. Gallbladder is unremarkable. No biliary dilatation. Pancreas: Unremarkable. No pancreatic ductal dilatation or surrounding inflammatory changes. Spleen: Normal in size without focal abnormality. Adrenals/Urinary Tract: Adrenal glands are unremarkable. Subcentimeter low-density lesions in the left kidney remain too small to characterize, but are unchanged. Kidneys are otherwise normal, without renal calculi, focal lesion, or hydronephrosis. Bladder is unremarkable. Stomach/Bowel: Stomach is within normal limits. Appendix appears normal. No evidence of bowel wall thickening, distention, or inflammatory changes. Vascular/Lymphatic: Severe aortic atherosclerosis. No enlarged abdominal or pelvic lymph nodes. Reproductive: Mild prostatomegaly. Other: No abdominal wall hernia or abnormality. No abdominopelvic ascites. Musculoskeletal: No acute or significant osseous findings. Degenerative changes of the thoracolumbar spine. IMPRESSION: 1.  No acute intra-abdominal process. 2.  Aortic atherosclerosis (ICD10-I70.0). Electronically Signed   By: Obie Dredge M.D.   On: 10/30/2017 09:39   Reviewed labs and radiology studies with patient and family. Galen Malkowski  Procedures Procedures (including critical care time)  Medications  Ordered in ED Medications  sodium chloride 0.9 % bolus 500 mL (0 mLs Intravenous Stopped 10/30/17 0904)  iopamidol (ISOVUE-300) 61 % injection 100 mL (100 mLs Intravenous Contrast Given 10/30/17 0910)     Initial Impression / Assessment and Plan / ED Course  I have reviewed the triage vital signs and the nursing notes.  Pertinent labs & imaging results that were available during my care of the patient were reviewed by me and considered in my medical decision making (see chart for details).     67 year old male with diarrhea earlier this week and now with some diffuse abdominal pain.  Labs are essentially normal with some mild anemia.  His creatinine is slightly elevated but is improved from previous recent creatinine levels.  CT of the abdomen shows some small lesions of the left kidney and abdominal atherosclerosis of the aorta but is otherwise normal without any explanation for his abdominal pain.  Recent is hypertensive here with a known history of hypertension.  He received 500 cc of normal saline here.  He remains hemodynamically stable.  We have discussed need for follow-up, return precautions, and results of the above studies.  He voices understanding of the plan.  Final Clinical Impressions(s) / ED Diagnoses   Final diagnoses:  Abdominal pain, unspecified abdominal location  Aortic atherosclerosis [facility])    ED Discharge Orders    None       Margarita Grizzle, MD 10/30/17 [phone]

## 2017-11-05 DIAGNOSIS — D649 Anemia, unspecified: Secondary | ICD-10-CM | POA: Diagnosis not present

## 2017-11-05 DIAGNOSIS — E079 Disorder of thyroid, unspecified: Secondary | ICD-10-CM | POA: Diagnosis not present

## 2017-11-05 DIAGNOSIS — I10 Essential (primary) hypertension: Secondary | ICD-10-CM | POA: Diagnosis not present

## 2017-11-05 DIAGNOSIS — R630 Anorexia: Secondary | ICD-10-CM | POA: Diagnosis not present

## 2017-11-19 DIAGNOSIS — D649 Anemia, unspecified: Secondary | ICD-10-CM | POA: Diagnosis not present

## 2017-11-19 DIAGNOSIS — F329 Major depressive disorder, single episode, unspecified: Secondary | ICD-10-CM | POA: Diagnosis not present

## 2017-11-19 DIAGNOSIS — I10 Essential (primary) hypertension: Secondary | ICD-10-CM | POA: Diagnosis not present

## 2017-11-22 DIAGNOSIS — E1165 Type 2 diabetes mellitus with hyperglycemia: Secondary | ICD-10-CM | POA: Diagnosis not present

## 2017-11-22 DIAGNOSIS — E1122 Type 2 diabetes mellitus with diabetic chronic kidney disease: Secondary | ICD-10-CM | POA: Diagnosis not present

## 2017-11-22 DIAGNOSIS — N183 Chronic kidney disease, stage 3 (moderate): Secondary | ICD-10-CM | POA: Diagnosis not present

## 2017-11-23 LAB — COMPLETE METABOLIC PANEL WITH GFR
AG Ratio: 1.1 (calc) (ref 1.0–2.5)
ALBUMIN MSPROF: 4 g/dL (ref 3.6–5.1)
ALKALINE PHOSPHATASE (APISO): 91 U/L (ref 40–115)
ALT: 10 U/L (ref 9–46)
AST: 14 U/L (ref 10–35)
BILIRUBIN TOTAL: 0.4 mg/dL (ref 0.2–1.2)
BUN / CREAT RATIO: 10 (calc) (ref 6–22)
BUN: 14 mg/dL (ref 7–25)
CHLORIDE: 104 mmol/L (ref 98–110)
CO2: 30 mmol/L (ref 20–32)
CREATININE: 1.38 mg/dL — AB (ref 0.70–1.25)
Calcium: 9.5 mg/dL (ref 8.6–10.3)
GFR, Est African American: 61 mL/min/{1.73_m2} (ref >=60)
GFR, Est Non African American: 53 mL/min/{1.73_m2} — ABNORMAL LOW (ref >=60)
Globulin: 3.5 g/dL (calc) (ref 1.9–3.7)
Glucose, Bld: 134 mg/dL — ABNORMAL HIGH (ref 65–99)
POTASSIUM: 4.1 mmol/L (ref 3.5–5.3)
SODIUM: 139 mmol/L (ref 135–146)
Total Protein: 7.5 g/dL (ref 6.1–8.1)

## 2017-11-23 LAB — HEMOGLOBIN A1C
EAG (MMOL/L): 7.9 (calc)
Hgb A1c MFr Bld: 6.6 % of total Hgb — ABNORMAL HIGH (ref <5.7)
MEAN PLASMA GLUCOSE: 143 (calc)

## 2017-11-29 ENCOUNTER — Encounter: Payer: Self-pay | Admitting: "Endocrinology

## 2017-11-29 ENCOUNTER — Ambulatory Visit (INDEPENDENT_AMBULATORY_CARE_PROVIDER_SITE_OTHER): Payer: Medicare Other | Admitting: "Endocrinology

## 2017-11-29 VITALS — BP 142/84 | HR 80 | Ht 67.0 in | Wt 138.0 lb

## 2017-11-29 DIAGNOSIS — E559 Vitamin D deficiency, unspecified: Secondary | ICD-10-CM | POA: Diagnosis not present

## 2017-11-29 DIAGNOSIS — E1165 Type 2 diabetes mellitus with hyperglycemia: Secondary | ICD-10-CM

## 2017-11-29 DIAGNOSIS — I10 Essential (primary) hypertension: Secondary | ICD-10-CM | POA: Diagnosis not present

## 2017-11-29 DIAGNOSIS — F17200 Nicotine dependence, unspecified, uncomplicated: Secondary | ICD-10-CM

## 2017-11-29 DIAGNOSIS — N183 Chronic kidney disease, stage 3 (moderate): Secondary | ICD-10-CM | POA: Diagnosis not present

## 2017-11-29 DIAGNOSIS — E782 Mixed hyperlipidemia: Secondary | ICD-10-CM

## 2017-11-29 DIAGNOSIS — IMO0002 Reserved for concepts with insufficient information to code with codable children: Secondary | ICD-10-CM

## 2017-11-29 DIAGNOSIS — E1122 Type 2 diabetes mellitus with diabetic chronic kidney disease: Secondary | ICD-10-CM | POA: Diagnosis not present

## 2017-11-29 NOTE — Progress Notes (Signed)
Subjective:    Patient ID: Chase Briggs, male    DOB: 1951-02-03, [facility]   Past Medical History:  Diagnosis Date  . Diabetes mellitus   . Fatty liver   . GERD (gastroesophageal reflux disease)   . HTN (hypertension)   . Hyperlipidemia    Past Surgical History:  Procedure Laterality Date  . BACK SURGERY    . COLONOSCOPY  01/2007   Dr. [name], reviewed report, mild diverticulosis. Prep adequate. Next TCS due 01/2017  . ESOPHAGOGASTRODUODENOSCOPY N/A 10/21/2012   RUE:[id number] GASTRITIS  . MASS EXCISION Right 10/26/2012   Procedure: EXCISION MASS;  Surgeon: [name], MD;  Location: [facility];  Service: General;  Laterality: Right;  . scalp mass  4-5 yrs ago   Dr [name]   Social History   Socioeconomic History  . Marital status: Married    Spouse name: None  . Number of children: 1  . Years of education: None  . Highest education level: None  Social Needs  . Financial resource strain: None  . Food insecurity - worry: None  . Food insecurity - inability: None  . Transportation needs - medical: None  . Transportation needs - non-medical: None  Occupational History    Comment: [employer], heavy lifting  Tobacco Use  . Smoking status: Current Every Day Smoker    Packs/day: 0.50    Years: 25.00    Pack years: 12.50    Types: Cigarettes  . Smokeless tobacco: Never Used  Substance and Sexual Activity  . Alcohol use: No    Comment: hx heavy etoh x 3 yrs, quit 30+ yrs ago  . Drug use: No  . Sexual activity: Yes    Birth control/protection: None  Other Topics Concern  . None  Social History Narrative  . None   Outpatient Encounter Medications as of 11/29/2017  Medication Sig  . acetaminophen (TYLENOL) 500 MG tablet Take 500 mg by mouth every 6 (six) hours as needed for moderate pain.  Marland Kitchen amLODipine (NORVASC) 10 MG tablet Take 1 tablet (10 mg total) by mouth daily.  Marland Kitchen aspirin EC 81 MG tablet Take 81 mg by mouth daily.  .  cloNIDine (CATAPRES) 0.3 MG tablet Take 0.3 mg by mouth at bedtime.  Marland Kitchen FLUoxetine (PROZAC) 10 MG capsule Take 10 mg by mouth daily.  . Insulin Glargine (TOUJEO SOLOSTAR) 300 UNIT/ML SOPN Inject 15 Units into the skin daily with breakfast. (Patient taking differently: Inject 20 Units into the skin daily with breakfast. )  . Insulin Pen Needle (PEN NEEDLES) 32G X 4 MM MISC 1 each by Does not apply route at bedtime.  Marland Kitchen lisinopril-hydrochlorothiazide (PRINZIDE,ZESTORETIC) 10-12.5 MG tablet Take 1 tablet by mouth daily.  . ondansetron (ZOFRAN) 4 MG tablet Take 1 tablet (4 mg total) by mouth every 8 (eight) hours as needed for nausea or vomiting.   No facility-administered encounter medications on file as of 11/29/2017.    ALLERGIES: No Known Allergies VACCINATION STATUS:  There is no immunization history on file for this patient.  Diabetes  He presents for his follow-up diabetic visit. He has type 2 diabetes mellitus. Onset time: He was diagnosed at approximate age of 35 years. His disease course has been improving (He did have oral and intra-articular steroid exposure on 2 separate occasions since his last visit as related to back pain.). There are no hypoglycemic associated symptoms. Pertinent negatives for hypoglycemia include no confusion, headaches, pallor or seizures. There are no  diabetic associated symptoms. Pertinent negatives for diabetes include no chest pain, no fatigue, no polydipsia, no polyphagia, no polyuria and no weakness. There are no hypoglycemic complications. Symptoms are improving. Risk factors for coronary artery disease include diabetes mellitus, dyslipidemia, hypertension, male sex and sedentary lifestyle. He is compliant with treatment most of the time. His weight is stable. He is following a generally unhealthy diet. When asked about meal planning, he reported none. He has not had a previous visit with a dietitian. His home blood glucose trend is increasing steadily. His  breakfast blood glucose range is generally 140-180 mg/dl. His bedtime blood glucose range is generally 140-180 mg/dl. His overall blood glucose range is 140-180 mg/dl. An ACE inhibitor/angiotensin II receptor blocker is being taken. Eye exam is current.  Hypertension  This is a chronic problem. The current episode started more than 1 year ago. The problem has been gradually improving since onset. The problem is uncontrolled. Pertinent negatives include no chest pain, headaches, neck pain, palpitations or shortness of breath. Risk factors for coronary artery disease include smoking/tobacco exposure, sedentary lifestyle, dyslipidemia, diabetes mellitus and family history. Past treatments include ACE inhibitors, calcium channel blockers and central alpha agonists. The current treatment provides no improvement. Compliance problems include psychosocial issues.   Hyperlipidemia  This is a chronic problem. The current episode started more than 1 year ago. Exacerbating diseases include diabetes. Pertinent negatives include no chest pain, myalgias or shortness of breath. Current antihyperlipidemic treatment includes statins. Risk factors for coronary artery disease include dyslipidemia, diabetes mellitus, male sex, hypertension and a sedentary lifestyle.     Review of Systems  Constitutional: Negative for fatigue and unexpected weight change.  HENT: Negative for dental problem, mouth sores and trouble swallowing.   Eyes: Negative for visual disturbance.  Respiratory: Negative for cough, choking, chest tightness, shortness of breath and wheezing.   Cardiovascular: Negative for chest pain, palpitations and leg swelling.  Gastrointestinal: Negative for abdominal distention, abdominal pain, constipation, diarrhea, nausea and vomiting.  Endocrine: Negative for polydipsia, polyphagia and polyuria.  Genitourinary: Negative for dysuria, flank pain, hematuria and urgency.  Musculoskeletal: Negative for back pain,  gait problem, myalgias and neck pain.  Skin: Negative for pallor, rash and wound.  Neurological: Negative for seizures, syncope, weakness, numbness and headaches.  Psychiatric/Behavioral: Negative.  Negative for confusion and dysphoric mood.    Objective:    BP (!) 142/84   Pulse 80   Ht 5\' 7"  (1.702 m)   Wt 138 lb (62.6 kg)   BMI 21.61 kg/m   Wt Readings from Last 3 Encounters:  11/29/17 138 lb (62.6 kg)  10/30/17 138 lb (62.6 kg)  08/31/17 136 lb (61.7 kg)    Physical Exam  Constitutional: He is oriented to person, place, and time. He appears well-developed and well-nourished. He is cooperative. No distress.  HENT:  Head: Normocephalic and atraumatic.  Eyes: EOM are normal.  Neck: Normal range of motion. Neck supple. No tracheal deviation present. No thyromegaly present.  Cardiovascular: Normal rate, S1 normal, S2 normal and normal heart sounds. Exam reveals no gallop.  No murmur heard. Pulses:      Dorsalis pedis pulses are 1+ on the right side, and 1+ on the left side.       Posterior tibial pulses are 1+ on the right side, and 1+ on the left side.  Pulmonary/Chest: Breath sounds normal. No respiratory distress. He has no wheezes.  Abdominal: Soft. Bowel sounds are normal. He exhibits no distension. There is no tenderness.  There is no guarding and no CVA tenderness.  Musculoskeletal: He exhibits no edema.       Right shoulder: He exhibits no swelling and no deformity.  Neurological: He is alert and oriented to person, place, and time. He has normal strength and normal reflexes. No cranial nerve deficit or sensory deficit. Gait normal.  Skin: Skin is warm and dry. No rash noted. No cyanosis. Nails show no clubbing.  Psychiatric: He has a normal mood and affect. His speech is normal and behavior is normal. Judgment and thought content normal. Cognition and memory are normal.     Complete Blood Count (Most recent): Lab Results  Component Value Date   WBC 5.8 10/30/2017    HGB 12.7 (L) 10/30/2017   HCT 38.6 (L) 10/30/2017   MCV 94.8 10/30/2017   PLT 203 10/30/2017   Chemistry (most recent): Lab Results  Component Value Date   NA 139 11/22/2017   K 4.1 11/22/2017   CL 104 11/22/2017   CO2 30 11/22/2017   BUN 14 11/22/2017   CREATININE 1.38 (H) 11/22/2017   Diabetic Labs (most recent): Lab Results  Component Value Date   HGBA1C 6.6 (H) 11/22/2017   HGBA1C 8.5 (H) 08/20/2017   HGBA1C 6.9 (H) 05/24/2017    Assessment & Plan:   1. Type 2 diabetes mellitus without complication, without long-term current use of insulin (HCC)  -He has had hyperosmolar state in the past, patient remains at a high risk for more acute and chronic complications of diabetes which include CAD, CVA, CKD, retinopathy, and neuropathy. These are all discussed in detail with the patient.  Patient came with controlled glycemic profile, A1c 6.6% improving from 8.5% during last visit.    -Overall, he has improved his A1c from 12.6%.      Recent labs reviewed, state 3 renal insufficiency. - I have re-counseled the patient on diet management  by adopting a carbohydrate restricted / protein rich  Diet.  -  Suggestion is made for him to avoid simple carbohydrates  from his diet including Cakes, Sweet Desserts / Pastries, Ice Cream, Soda (diet and regular), Sweet Tea, Candies, Chips, Cookies, Store Bought Juices, Alcohol in Excess of  1-2 drinks a day, Artificial Sweeteners, and "Sugar-free" Products. This will help patient to have stable blood glucose profile and potentially avoid unintended weight gain.   - Patient is advised to stick to a routine mealtimes to eat 3 meals  a day and avoid unnecessary snacks ( to snack only to correct hypoglycemia).  - I have approached patient with the following individualized plan to manage diabetes and patient agrees.  - He has benefited from insulin initiation.   - I   advised him to continue Toujeo 20 units   Every morning with breakfast ,  continue strict  monitoring blood glucose daily  before breakfast and at bedtime and PRN . He will call clinic if he registers blood glucose readings less than 70 or greater than 200 mg/dL.  -   He has stage 3 renal insufficiency , will stay off of metformin for now. If his renal insufficiency continues, he will need consultation with nephrology.  -Patient is encouraged to call clinic for blood glucose levels less than 70 or above 200 mg /dl.  - Patient is not a candidate for  incretin therapy . - Patient specific target  for A1c; LDL, HDL, Triglycerides, and  Waist Circumference were discussed in detail.  2) BP/HTN:  Uncontrolled, proving.  He has 3 medications  including losartan, clonidine, and amlodipine.  Since his last visit, he has been more consistent taking  his blood pressure medications. I urged him to start his medications as prescribed.   He is a chronic heavy smoker, stating blood pressure control, re-counseled him about smoking cessation.   3) Lipids/HPL: Advised him to continue statins. 4)  Weight/Diet: He has regained 18 lbs  overall and this is a good development for him.  - He would benefit from addition of more complex carbohydrates, information provided. CDE consult is requested.  5. Vitamin D deficiency - His insurance would not allow for 25 hydroxy vitamin D recheck. - He will continue with vitamin D3 5000 units daily for 90 days.   6) Chronic Care/Health Maintenance:  -Patient is on ACEI/ARB and Statin medications and encouraged to continue to follow up with Ophthalmology, Podiatrist at least yearly or according to recommendations, and advised to  Quit  smoking. I have recommended yearly flu vaccine and pneumonia vaccination at least every 5 years; moderate intensity exercise for up to 150 minutes weekly; and  sleep for at least 7 hours a day.  - I advised patient to maintain close follow up with [facility] for primary care needs. - I have  advised him to avoid over-the-counter pain medications to avoid additional risk for renal insufficiency.  - Time spent with the patient: 25 min, of which >50% was spent in reviewing his blood glucose logs , discussing his hypo- and hyper-glycemic episodes, reviewing his current and  previous labs and insulin doses and developing a plan to avoid hypo- and hyper-glycemia. Please refer to Patient Instructions for Blood Glucose Monitoring and Insulin/Medications Dosing Guide"  in media tab for additional information. Roseanna Rainbowalvin E Valdivia participated in the discussions, expressed understanding, and voiced agreement with the above plans.  All questions were answered to his satisfaction. he is encouraged to contact clinic should he have any questions or concerns prior to his return visit.  Follow up plan: -Return in about 4 months (around 03/31/2018) for follow up with pre-visit labs, meter, and logs.  Marquis LunchGebre Jylan Loeza, MD Phone: [phone]  Fax: [phone]  This note was partially dictated with voice recognition software. Similar sounding words can be transcribed inadequately or may not  be corrected upon review.  11/29/2017, 12:08 PM

## 2017-12-24 DIAGNOSIS — F329 Major depressive disorder, single episode, unspecified: Secondary | ICD-10-CM | POA: Diagnosis not present

## 2017-12-24 DIAGNOSIS — E785 Hyperlipidemia, unspecified: Secondary | ICD-10-CM | POA: Diagnosis not present

## 2017-12-24 DIAGNOSIS — R52 Pain, unspecified: Secondary | ICD-10-CM | POA: Diagnosis not present

## 2018-02-23 DIAGNOSIS — E1165 Type 2 diabetes mellitus with hyperglycemia: Secondary | ICD-10-CM | POA: Diagnosis not present

## 2018-02-23 DIAGNOSIS — E559 Vitamin D deficiency, unspecified: Secondary | ICD-10-CM | POA: Diagnosis not present

## 2018-02-23 DIAGNOSIS — N183 Chronic kidney disease, stage 3 (moderate): Secondary | ICD-10-CM | POA: Diagnosis not present

## 2018-02-23 DIAGNOSIS — E1122 Type 2 diabetes mellitus with diabetic chronic kidney disease: Secondary | ICD-10-CM | POA: Diagnosis not present

## 2018-02-24 LAB — VITAMIN D 25 HYDROXY (VIT D DEFICIENCY, FRACTURES): VIT D 25 HYDROXY: 55 ng/mL (ref 30–100)

## 2018-02-24 LAB — COMPLETE METABOLIC PANEL WITH GFR
AG Ratio: 1.3 (calc) (ref 1.0–2.5)
ALKALINE PHOSPHATASE (APISO): 89 U/L (ref 40–115)
ALT: 11 U/L (ref 9–46)
AST: 16 U/L (ref 10–35)
Albumin: 4.3 g/dL (ref 3.6–5.1)
BILIRUBIN TOTAL: 0.6 mg/dL (ref 0.2–1.2)
BUN/Creatinine Ratio: 11 (calc) (ref 6–22)
BUN: 17 mg/dL (ref 7–25)
CHLORIDE: 103 mmol/L (ref 98–110)
CO2: 29 mmol/L (ref 20–32)
CREATININE: 1.6 mg/dL — AB (ref 0.70–1.25)
Calcium: 10 mg/dL (ref 8.6–10.3)
GFR, Est African American: 51 mL/min/{1.73_m2} — ABNORMAL LOW (ref >=60)
GFR, Est Non African American: 44 mL/min/{1.73_m2} — ABNORMAL LOW (ref >=60)
GLUCOSE: 108 mg/dL — AB (ref 65–99)
Globulin: 3.3 g/dL (calc) (ref 1.9–3.7)
Potassium: 4.5 mmol/L (ref 3.5–5.3)
Sodium: 140 mmol/L (ref 135–146)
Total Protein: 7.6 g/dL (ref 6.1–8.1)

## 2018-02-24 LAB — HEMOGLOBIN A1C
HEMOGLOBIN A1C: 6.7 %{Hb} — AB (ref <5.7)
MEAN PLASMA GLUCOSE: 146 (calc)
eAG (mmol/L): 8.1 (calc)

## 2018-03-25 DIAGNOSIS — M545 Low back pain: Secondary | ICD-10-CM | POA: Diagnosis not present

## 2018-03-25 DIAGNOSIS — M79669 Pain in unspecified lower leg: Secondary | ICD-10-CM | POA: Diagnosis not present

## 2018-04-01 ENCOUNTER — Encounter: Payer: Self-pay | Admitting: "Endocrinology

## 2018-04-01 ENCOUNTER — Ambulatory Visit (INDEPENDENT_AMBULATORY_CARE_PROVIDER_SITE_OTHER): Payer: Medicare Other | Admitting: "Endocrinology

## 2018-04-01 VITALS — BP 131/80 | HR 82 | Ht 67.0 in | Wt 146.0 lb

## 2018-04-01 DIAGNOSIS — F17200 Nicotine dependence, unspecified, uncomplicated: Secondary | ICD-10-CM | POA: Diagnosis not present

## 2018-04-01 DIAGNOSIS — I10 Essential (primary) hypertension: Secondary | ICD-10-CM | POA: Diagnosis not present

## 2018-04-01 DIAGNOSIS — E782 Mixed hyperlipidemia: Secondary | ICD-10-CM | POA: Diagnosis not present

## 2018-04-01 DIAGNOSIS — IMO0002 Reserved for concepts with insufficient information to code with codable children: Secondary | ICD-10-CM

## 2018-04-01 DIAGNOSIS — E1165 Type 2 diabetes mellitus with hyperglycemia: Secondary | ICD-10-CM | POA: Diagnosis not present

## 2018-04-01 DIAGNOSIS — E559 Vitamin D deficiency, unspecified: Secondary | ICD-10-CM

## 2018-04-01 DIAGNOSIS — E1122 Type 2 diabetes mellitus with diabetic chronic kidney disease: Secondary | ICD-10-CM

## 2018-04-01 DIAGNOSIS — N183 Chronic kidney disease, stage 3 (moderate): Secondary | ICD-10-CM | POA: Diagnosis not present

## 2018-04-01 NOTE — Progress Notes (Signed)
Subjective:    Patient ID: Chase Briggs, male    DOB: 06/19/1951, [facility]   Past Medical History:  Diagnosis Date  . Diabetes mellitus   . Fatty liver   . GERD (gastroesophageal reflux disease)   . HTN (hypertension)   . Hyperlipidemia    Past Surgical History:  Procedure Laterality Date  . BACK SURGERY    . COLONOSCOPY  01/2007   Dr. [name], reviewed report, mild diverticulosis. Prep adequate. Next TCS due 01/2017  . ESOPHAGOGASTRODUODENOSCOPY N/A 10/21/2012   ZOX:[id number] GASTRITIS  . MASS EXCISION Right 10/26/2012   Procedure: EXCISION MASS;  Surgeon: [name], MD;  Location: [facility];  Service: General;  Laterality: Right;  . scalp mass  4-5 yrs ago   Dr [name]   Social History   Socioeconomic History  . Marital status: Married    Spouse name: Not on file  . Number of children: 1  . Years of education: Not on file  . Highest education level: Not on file  Occupational History    Comment: [employer], heavy lifting  Social Needs  . Financial resource strain: Not on file  . Food insecurity:    Worry: Not on file    Inability: Not on file  . Transportation needs:    Medical: Not on file    Non-medical: Not on file  Tobacco Use  . Smoking status: Current Every Day Smoker    Packs/day: 0.50    Years: 25.00    Pack years: 12.50    Types: Cigarettes  . Smokeless tobacco: Never Used  Substance and Sexual Activity  . Alcohol use: No    Comment: hx heavy etoh x 3 yrs, quit 30+ yrs ago  . Drug use: No  . Sexual activity: Yes    Birth control/protection: None  Lifestyle  . Physical activity:    Days per week: Not on file    Minutes per session: Not on file  . Stress: Not on file  Relationships  . Social connections:    Talks on phone: Not on file    Gets together: Not on file    Attends religious service: Not on file    Active member of club or organization: Not on file    Attends meetings of clubs or  organizations: Not on file    Relationship status: Not on file  Other Topics Concern  . Not on file  Social History Narrative  . Not on file   Outpatient Encounter Medications as of 04/01/2018  Medication Sig  . acetaminophen (TYLENOL) 500 MG tablet Take 500 mg by mouth every 6 (six) hours as needed for moderate pain.  Marland Kitchen amLODipine (NORVASC) 10 MG tablet Take 1 tablet (10 mg total) by mouth daily.  Marland Kitchen aspirin EC 81 MG tablet Take 81 mg by mouth daily.  . cloNIDine (CATAPRES) 0.3 MG tablet Take 0.3 mg by mouth at bedtime.  Marland Kitchen FLUoxetine (PROZAC) 10 MG capsule Take 10 mg by mouth daily.  . Insulin Glargine (TOUJEO SOLOSTAR) 300 UNIT/ML SOPN Inject 15 Units into the skin daily with breakfast. (Patient taking differently: Inject 20 Units into the skin daily with breakfast. )  . Insulin Pen Needle (PEN NEEDLES) 32G X 4 MM MISC 1 each by Does not apply route at bedtime.  Marland Kitchen lisinopril-hydrochlorothiazide (PRINZIDE,ZESTORETIC) 10-12.5 MG tablet Take 1 tablet by mouth daily.  . ondansetron (ZOFRAN) 4 MG tablet Take 1 tablet (4 mg total) by mouth every  8 (eight) hours as needed for nausea or vomiting.   No facility-administered encounter medications on file as of 04/01/2018.    ALLERGIES: No Known Allergies VACCINATION STATUS:  There is no immunization history on file for this patient.  Diabetes  He presents for his follow-up diabetic visit. He has type 2 diabetes mellitus. Onset time: He was diagnosed at approximate age of 35 years. His disease course has been stable. There are no hypoglycemic associated symptoms. Pertinent negatives for hypoglycemia include no confusion, headaches, pallor or seizures. There are no diabetic associated symptoms. Pertinent negatives for diabetes include no chest pain, no fatigue, no polydipsia, no polyphagia, no polyuria and no weakness. There are no hypoglycemic complications. Symptoms are stable. Risk factors for coronary artery disease include diabetes mellitus,  dyslipidemia, hypertension, male sex and sedentary lifestyle. He is compliant with treatment most of the time. His weight is increasing steadily. He is following a generally unhealthy diet. When asked about meal planning, he reported none. He has not had a previous visit with a dietitian. His home blood glucose trend is increasing steadily. His breakfast blood glucose range is generally 140-180 mg/dl. His bedtime blood glucose range is generally 130-140 mg/dl. His overall blood glucose range is 130-140 mg/dl. An ACE inhibitor/angiotensin II receptor blocker is being taken. Eye exam is current.  Hypertension  This is a chronic problem. The current episode started more than 1 year ago. The problem has been gradually improving since onset. The problem is uncontrolled. Pertinent negatives include no chest pain, headaches, neck pain, palpitations or shortness of breath. Risk factors for coronary artery disease include smoking/tobacco exposure, sedentary lifestyle, dyslipidemia, diabetes mellitus and family history. Past treatments include ACE inhibitors, calcium channel blockers and central alpha agonists. The current treatment provides no improvement. Compliance problems include psychosocial issues.   Hyperlipidemia  This is a chronic problem. The current episode started more than 1 year ago. Exacerbating diseases include diabetes. Pertinent negatives include no chest pain, myalgias or shortness of breath. Current antihyperlipidemic treatment includes statins. Risk factors for coronary artery disease include dyslipidemia, diabetes mellitus, male sex, hypertension and a sedentary lifestyle.     Review of Systems  Constitutional: Negative for fatigue and unexpected weight change.  HENT: Negative for dental problem, mouth sores and trouble swallowing.   Eyes: Negative for visual disturbance.  Respiratory: Negative for cough, choking, chest tightness, shortness of breath and wheezing.   Cardiovascular: Negative  for chest pain, palpitations and leg swelling.  Gastrointestinal: Negative for abdominal distention, abdominal pain, constipation, diarrhea, nausea and vomiting.  Endocrine: Negative for polydipsia, polyphagia and polyuria.  Genitourinary: Negative for dysuria, flank pain, hematuria and urgency.  Musculoskeletal: Negative for back pain, gait problem, myalgias and neck pain.  Skin: Negative for pallor, rash and wound.  Neurological: Negative for seizures, syncope, weakness, numbness and headaches.  Psychiatric/Behavioral: Negative.  Negative for confusion and dysphoric mood.    Objective:    BP 131/80   Pulse 82   Ht 5\' 7"  (1.702 m)   Wt 146 lb (66.2 kg)   BMI 22.87 kg/m   Wt Readings from Last 3 Encounters:  04/01/18 146 lb (66.2 kg)  11/29/17 138 lb (62.6 kg)  10/30/17 138 lb (62.6 kg)    Physical Exam  Constitutional: He is oriented to person, place, and time. He appears well-developed. He is cooperative. No distress.  HENT:  Head: Normocephalic and atraumatic.  Eyes: EOM are normal.  Neck: Normal range of motion. Neck supple. No tracheal deviation present. No  thyromegaly present.  Cardiovascular: Normal rate, S1 normal and S2 normal. Exam reveals no gallop.  No murmur heard. Pulses:      Dorsalis pedis pulses are 1+ on the right side, and 1+ on the left side.       Posterior tibial pulses are 1+ on the right side, and 1+ on the left side.  Pulmonary/Chest: Effort normal. No respiratory distress. He has no wheezes.  Abdominal: Bowel sounds are normal. He exhibits no distension. There is no tenderness. There is no guarding and no CVA tenderness.  Musculoskeletal: He exhibits no edema.       Right shoulder: He exhibits no swelling and no deformity.  Neurological: He is alert and oriented to person, place, and time. He has normal strength and normal reflexes. No cranial nerve deficit or sensory deficit. Gait normal.  Skin: Skin is warm and dry. No rash noted. No cyanosis. Nails  show no clubbing.  Psychiatric: He has a normal mood and affect. His speech is normal. Judgment normal. Cognition and memory are normal.    Complete Blood Count (Most recent): Lab Results  Component Value Date   WBC 5.8 10/30/2017   HGB 12.7 (L) 10/30/2017   HCT 38.6 (L) 10/30/2017   MCV 94.8 10/30/2017   PLT 203 10/30/2017   Chemistry (most recent): Lab Results  Component Value Date   NA 140 02/23/2018   K 4.5 02/23/2018   CL 103 02/23/2018   CO2 29 02/23/2018   BUN 17 02/23/2018   CREATININE 1.60 (H) 02/23/2018   Diabetic Labs (most recent): Lab Results  Component Value Date   HGBA1C 6.7 (H) 02/23/2018   HGBA1C 6.6 (H) 11/22/2017   HGBA1C 8.5 (H) 08/20/2017   Lipid Panel     Component Value Date/Time   CHOL 104 08/20/2017 0725   TRIG 84 08/20/2017 0725   HDL 43 08/20/2017 0725   CHOLHDL 2.4 08/20/2017 0725   LDLCALC 45 08/20/2017 0725     Assessment & Plan:   1. Type 2 diabetes mellitus without complication, without long-term current use of insulin (HCC)  -He presents with controlled glycemia and stable A1c of 6.7%, overall improving from 12.6%.    He Has had hyperosmolar state in the past, patient remains at a high risk for more acute and chronic complications of diabetes which include CAD, CVA, CKD, retinopathy, and neuropathy. These are all discussed in detail with the patient.    Recent labs reviewed, state 3 renal insufficiency. - I have re-counseled the patient on diet management  by adopting a carbohydrate restricted / protein rich  Diet.  -  Suggestion is made for him to avoid simple carbohydrates  from his diet including Cakes, Sweet Desserts / Pastries, Ice Cream, Soda (diet and regular), Sweet Tea, Candies, Chips, Cookies, Store Bought Juices, Alcohol in Excess of  1-2 drinks a day, Artificial Sweeteners, and "Sugar-free" Products. This will help patient to have stable blood glucose profile and potentially avoid unintended weight gain.   - Patient  is advised to stick to a routine mealtimes to eat 3 meals  a day and avoid unnecessary snacks ( to snack only to correct hypoglycemia).  - I have approached patient with the following individualized plan to manage diabetes and patient agrees.  - He has benefited from insulin initiation.   - I   advised him to lower his Toujeo to 15  units   Every morning with breakfast , continue strict  monitoring blood glucose daily  before breakfast and  at bedtime and PRN . He will call clinic if he registers blood glucose readings less than 70 or greater than 200 mg/dL.  -   He has stage 3 renal insufficiency , will stay off of metformin for now. If his renal insufficiency continues, he will need consultation with nephrology.  -Patient is encouraged to call clinic for blood glucose levels less than 70 or above 200 mg /dl.  - Patient is not a candidate for  incretin therapy . - Patient specific target  for A1c; LDL, HDL, Triglycerides, and  Waist Circumference were discussed in detail.  2) BP/HTN: His blood pressure is controlled.    He has 3 medications including losartan, clonidine, and amlodipine.  Since his last visit, he has been more consistent taking  his blood pressure medications. I urged him to start his medications as prescribed.   He is a chronic heavy smoker, re-counseled him about smoking cessation.   3) Lipids/HPL: Recent lipid panel showed LDL of 45.  He is not on statins at the moment.   4)  Weight/Diet: He has regained 20+ lbs  overall and this is a good development for him.  - He would benefit from addition of more complex carbohydrates, information provided. CDE consult is requested.  5. Vitamin D deficiency - His insurance would not allow for 25 hydroxy vitamin D recheck. - He will continue with vitamin D3 5000 units daily for 90 days.   6) Chronic Care/Health Maintenance:  -Patient is on ACEI/ARB and Statin medications and encouraged to continue to follow up with Ophthalmology,  Podiatrist at least yearly or according to recommendations, and advised to  Quit  smoking. I have recommended yearly flu vaccine and pneumonia vaccination at least every 5 years; moderate intensity exercise for up to 150 minutes weekly; and  sleep for at least 7 hours a day.  - I advised patient to maintain close follow up with [facility] for primary care needs. - I have advised him to avoid over-the-counter pain medications to avoid additional risk for renal insufficiency.  - Time spent with the patient: 25 min, of which >50% was spent in reviewing his blood glucose logs , discussing his hypo- and hyper-glycemic episodes, reviewing his current and  previous labs and insulin doses and developing a plan to avoid hypo- and hyper-glycemia. Please refer to Patient Instructions for Blood Glucose Monitoring and Insulin/Medications Dosing Guide"  in media tab for additional information. Roseanna Rainbowalvin E Maniaci participated in the discussions, expressed understanding, and voiced agreement with the above plans.  All questions were answered to his satisfaction. he is encouraged to contact clinic should he have any questions or concerns prior to his return visit.   Follow up plan: -Return in about 6 months (around 10/02/2018) for meter, and logs.  Marquis LunchGebre Undray Allman, MD Phone: [phone]  Fax: [phone]  This note was partially dictated with voice recognition software. Similar sounding words can be transcribed inadequately or may not  be corrected upon review.  04/01/2018, 8:40 AM

## 2018-04-01 NOTE — Patient Instructions (Signed)
                                             Additional Care Considerations for Diabetes   -Diabetes he is a chronic disease.  The most important care consideration is regular follow-up with your diabetes care provider with the goal being avoiding or delaying its complications and to take advantage of advances in medications and technology.    -Type 2 diabetes is known to coexist with other important comorbidities such as high blood pressure and high cholesterol.  It is critical to control not only the diabetes but also the high blood pressure and high cholesterol to minimize and delay the risk of complications including coronary artery disease, stroke, amputations, blindness, etc.    - Studies showed that people with diabetes will benefit from a class of medications known as ACE inhibitors and statins.  Unless there are specific reasons not to be on these medications, the standard of care is to consider getting one from these groups of medications at a minimum dose.  These medications are generally considered safe and proven to help protect the heart and the kidneys.    - People with diabetes are encouraged to initiate and maintain regular follow-up with eye doctors, foot doctors, dentists , and if necessary heart and kidney doctors.     - It is highly recommended that people with diabetes quit smoking or stay away from smoking, and get yearly  flu vaccine and pneumonia vaccine at least every 5 years.  One other important lifestyle recommendation is to ensure adequate sleep - at least 7 hours of uninterrupted sleep at night.  -Exercise: If you are able: 30 -60 minutes a day, 4 days a week, or 150 minutes a week.  The longer the better.  Combine stretch, strength, and aerobic activities.  If you were told in the past that you have high risk for cardiovascular diseases, you may seek evaluation by your heart doctor prior to initiating moderate to intense exercise programs.          

## 2018-07-07 ENCOUNTER — Ambulatory Visit (HOSPITAL_COMMUNITY)
Admission: RE | Admit: 2018-07-07 | Discharge: 2018-07-07 | Disposition: A | Discharge status: Home / Self Care | Payer: Medicare Other | Source: Ambulatory Visit | Attending: Nurse Practitioner | Admitting: Nurse Practitioner

## 2018-07-07 ENCOUNTER — Other Ambulatory Visit (HOSPITAL_COMMUNITY): Payer: Self-pay | Admitting: Nurse Practitioner

## 2018-07-07 DIAGNOSIS — R202 Paresthesia of skin: Secondary | ICD-10-CM | POA: Diagnosis not present

## 2018-07-07 DIAGNOSIS — Z125 Encounter for screening for malignant neoplasm of prostate: Secondary | ICD-10-CM | POA: Diagnosis not present

## 2018-07-07 DIAGNOSIS — M545 Low back pain: Secondary | ICD-10-CM | POA: Diagnosis not present

## 2018-07-07 DIAGNOSIS — I10 Essential (primary) hypertension: Secondary | ICD-10-CM | POA: Diagnosis not present

## 2018-07-07 DIAGNOSIS — F17200 Nicotine dependence, unspecified, uncomplicated: Secondary | ICD-10-CM | POA: Diagnosis not present

## 2018-07-07 DIAGNOSIS — R0609 Other forms of dyspnea: Principal | ICD-10-CM

## 2018-07-07 DIAGNOSIS — E559 Vitamin D deficiency, unspecified: Secondary | ICD-10-CM | POA: Diagnosis not present

## 2018-07-07 DIAGNOSIS — F330 Major depressive disorder, recurrent, mild: Secondary | ICD-10-CM | POA: Diagnosis not present

## 2018-07-07 DIAGNOSIS — R630 Anorexia: Secondary | ICD-10-CM | POA: Diagnosis not present

## 2018-07-07 DIAGNOSIS — E119 Type 2 diabetes mellitus without complications: Secondary | ICD-10-CM | POA: Diagnosis not present

## 2018-07-08 ENCOUNTER — Telehealth (HOSPITAL_COMMUNITY): Payer: Self-pay | Admitting: *Deleted

## 2018-07-08 NOTE — Telephone Encounter (Signed)
Tried several times to reach patient to schedule appointment.

## 2018-07-11 ENCOUNTER — Other Ambulatory Visit: Payer: Self-pay

## 2018-07-11 DIAGNOSIS — M79662 Pain in left lower leg: Principal | ICD-10-CM

## 2018-07-11 DIAGNOSIS — M79661 Pain in right lower leg: Secondary | ICD-10-CM

## 2018-07-11 DIAGNOSIS — I739 Peripheral vascular disease, unspecified: Secondary | ICD-10-CM

## 2018-07-18 ENCOUNTER — Ambulatory Visit (HOSPITAL_COMMUNITY)
Admission: RE | Admit: 2018-07-18 | Discharge: 2018-07-18 | Disposition: A | Discharge status: Home / Self Care | Payer: Medicare Other | Source: Ambulatory Visit | Attending: Nurse Practitioner | Admitting: Nurse Practitioner

## 2018-07-18 ENCOUNTER — Encounter: Payer: Self-pay | Admitting: Vascular Surgery

## 2018-07-18 ENCOUNTER — Ambulatory Visit (INDEPENDENT_AMBULATORY_CARE_PROVIDER_SITE_OTHER): Payer: Medicare Other | Admitting: Vascular Surgery

## 2018-07-18 VITALS — BP 156/82 | HR 67 | Temp 97.8°F | Resp 20 | Ht 67.0 in | Wt 146.6 lb

## 2018-07-18 DIAGNOSIS — I739 Peripheral vascular disease, unspecified: Secondary | ICD-10-CM | POA: Diagnosis not present

## 2018-07-18 DIAGNOSIS — M79662 Pain in left lower leg: Secondary | ICD-10-CM | POA: Diagnosis not present

## 2018-07-18 DIAGNOSIS — M79661 Pain in right lower leg: Secondary | ICD-10-CM

## 2018-07-18 NOTE — Progress Notes (Signed)
[facility]ist of [facility]  Patient name: Chase Briggs MRN: [id number] DOB: 12-Jul-1951 Sex: male  REASON FOR CONSULT: Evaluation of bilateral lower extremity medication right greater than left  Seen today in our [place] office  HPI: Chase Briggs is a 67 y.o. male, who is here today for evaluation of lower extremity claudication.  He is a very pleasant gentleman who reports calf claudication which can extend up into his thigh and occasionally in his buttocks with a great deal of walking.  He reports that this is worse if he is walking uphill.  He denies any history of tissue loss and has no history of arterial rest pain.  He reports that this limits him to some degree but that he is retired and does not limit his ability for work.  No history of cardiac disease and no history of stroke.  He reports that this is been present for quite a few months and bothers him more in the summertime when he is active and is less active in the winter.  He does have a prior history of lumbar disc surgery 25 years ago and reports some of the similarity to the pain he had with this.  Past Medical History:  Diagnosis Date  . Diabetes mellitus   . Fatty liver   . GERD (gastroesophageal reflux disease)   . HTN (hypertension)   . Hyperlipidemia     Family History  Problem Relation Age of Onset  . Diabetes Mother   . Diabetes Father   . Diabetes Brother   . Colon cancer Neg Hx   . Liver disease Neg Hx     SOCIAL HISTORY: Social History   Socioeconomic History  . Marital status: Married    Spouse name: Not on file  . Number of children: 1  . Years of education: Not on file  . Highest education level: Not on file  Occupational History    Comment: [employer], heavy lifting  Social Needs  . Financial resource strain: Not on file  . Food insecurity:    Worry: Not on file    Inability: Not on file  . Transportation needs:    Medical:  Not on file    Non-medical: Not on file  Tobacco Use  . Smoking status: Current Every Day Smoker    Packs/day: 0.50    Years: 25.00    Pack years: 12.50    Types: Cigarettes  . Smokeless tobacco: Never Used  Substance and Sexual Activity  . Alcohol use: No    Comment: hx heavy etoh x 3 yrs, quit 30+ yrs ago  . Drug use: No  . Sexual activity: Yes    Birth control/protection: None  Lifestyle  . Physical activity:    Days per week: Not on file    Minutes per session: Not on file  . Stress: Not on file  Relationships  . Social connections:    Talks on phone: Not on file    Gets together: Not on file    Attends religious service: Not on file    Active member of club or organization: Not on file    Attends meetings of clubs or organizations: Not on file    Relationship status: Not on file  . Intimate partner violence:    Fear of current or ex partner: Not on file    Emotionally abused: Not on file    Physically abused: Not on file    Forced sexual activity: Not on  file  Other Topics Concern  . Not on file  Social History Narrative  . Not on file    No Known Allergies  Current Outpatient Medications  Medication Sig Dispense Refill  . acetaminophen (TYLENOL) 500 MG tablet Take 500 mg by mouth every 6 (six) hours as needed for moderate pain.    Marland Kitchen amLODipine (NORVASC) 10 MG tablet Take 1 tablet (10 mg total) by mouth daily. 90 tablet 0  . aspirin EC 81 MG tablet Take 81 mg by mouth daily.    . cloNIDine (CATAPRES) 0.3 MG tablet Take 0.3 mg by mouth at bedtime.    Marland Kitchen FLUoxetine (PROZAC) 10 MG capsule Take 10 mg by mouth daily.    . Insulin Glargine (TOUJEO SOLOSTAR) 300 UNIT/ML SOPN Inject 15 Units into the skin daily with breakfast. (Patient taking differently: Inject 20 Units into the skin daily with breakfast. ) 3 pen 0  . Insulin Pen Needle (PEN NEEDLES) 32G X 4 MM MISC 1 each by Does not apply route at bedtime. 200 each 2  . lisinopril-hydrochlorothiazide  (PRINZIDE,ZESTORETIC) 10-12.5 MG tablet Take 1 tablet by mouth daily.    . ondansetron (ZOFRAN) 4 MG tablet Take 1 tablet (4 mg total) by mouth every 8 (eight) hours as needed for nausea or vomiting. 10 tablet 0   No current facility-administered medications for this visit.     REVIEW OF SYSTEMS:  [X]  denotes positive finding, [ ]  denotes negative finding Cardiac  Comments:  Chest pain or chest pressure:    Shortness of breath upon exertion: x   Short of breath when lying flat:    Irregular heart rhythm:        Vascular    Pain in calf, thigh, or hip brought on by ambulation: x   Pain in feet at night that wakes you up from your sleep:     Blood clot in your veins:    Leg swelling:         Pulmonary    Oxygen at home:    Productive cough:     Wheezing:         Neurologic    Sudden weakness in arms or legs:     Sudden numbness in arms or legs:     Sudden onset of difficulty speaking or slurred speech:    Temporary loss of vision in one eye:     Problems with dizziness:         Gastrointestinal    Blood in stool:     Vomited blood:         Genitourinary    Burning when urinating:     Blood in urine:        Psychiatric    Major depression:         Hematologic    Bleeding problems:    Problems with blood clotting too easily:        Skin    Rashes or ulcers:        Constitutional    Fever or chills:      PHYSICAL EXAM: Vitals:   07/18/18 1109  BP: (!) 156/82  Pulse: 67  Resp: 20  Temp: 97.8 F (36.6 C)  TempSrc: Temporal  Weight: 146 lb 9.6 oz (66.5 kg)  Height: 5\' 7"  (1.702 m)    GENERAL: The patient is a well-nourished male, in no acute distress. The vital signs are documented above. CARDIOVASCULAR: Carotid arteries without bruits bilaterally.  2+ radial pulses bilaterally.  1+ right  femoral and 1-2+ left femoral pulse.  1+ left popliteal pulse.  I do not palpate pedal pulses bilaterally PULMONARY: There is good air exchange  ABDOMEN: Soft and  non-tender no abdominal bruit MUSCULOSKELETAL: There are no major deformities or cyanosis. NEUROLOGIC: No focal weakness or paresthesias are detected. SKIN: There are no ulcers or rashes noted. PSYCHIATRIC: The patient has a normal affect.  DATA:  Noninvasive studies at [facility] today reveal ankle arm index of 0.66 on the right and 0.81 on the left  MEDICAL ISSUES: Had a long discussion with the patient.  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  I explained that this is not limb threatening and would recommend treatment only if he is severely limited by this.  He reports that he is comfortable with this level of claudication and wishes observation only.  I encouraged him to continue walking program.  I did explain that this is directly related to cigarette smoking and explained the critical importance of smoking cessation.  He will continue to try this as well.  He will see [place] again on an as-needed basis   Larina Earthly, MD [facility] of [facility] Tel [phone] Pager [phone]

## 2018-07-25 DIAGNOSIS — F17200 Nicotine dependence, unspecified, uncomplicated: Secondary | ICD-10-CM | POA: Diagnosis not present

## 2018-07-25 DIAGNOSIS — M6740 Ganglion, unspecified site: Secondary | ICD-10-CM | POA: Diagnosis not present

## 2018-07-25 DIAGNOSIS — M79669 Pain in unspecified lower leg: Secondary | ICD-10-CM | POA: Diagnosis not present

## 2018-07-25 DIAGNOSIS — I10 Essential (primary) hypertension: Secondary | ICD-10-CM | POA: Diagnosis not present

## 2018-08-09 ENCOUNTER — Other Ambulatory Visit: Payer: Self-pay

## 2018-08-09 MED ORDER — PEN NEEDLES 32G X 4 MM MISC: 1.0000 | Freq: Every day | 2 refills | Status: DC

## 2018-08-10 DIAGNOSIS — I7300 Raynaud's syndrome without gangrene: Secondary | ICD-10-CM | POA: Diagnosis not present

## 2018-08-10 DIAGNOSIS — F17200 Nicotine dependence, unspecified, uncomplicated: Secondary | ICD-10-CM | POA: Diagnosis not present

## 2018-08-10 DIAGNOSIS — M6740 Ganglion, unspecified site: Secondary | ICD-10-CM | POA: Diagnosis not present

## 2018-08-10 DIAGNOSIS — R202 Paresthesia of skin: Secondary | ICD-10-CM | POA: Diagnosis not present

## 2018-08-15 ENCOUNTER — Ambulatory Visit (INDEPENDENT_AMBULATORY_CARE_PROVIDER_SITE_OTHER): Payer: Medicare Other | Admitting: Vascular Surgery

## 2018-08-15 ENCOUNTER — Encounter: Payer: Self-pay | Admitting: Vascular Surgery

## 2018-08-15 VITALS — BP 162/66 | Temp 99.5°F | Ht 67.0 in | Wt 147.1 lb

## 2018-08-15 DIAGNOSIS — I739 Peripheral vascular disease, unspecified: Secondary | ICD-10-CM | POA: Diagnosis not present

## 2018-08-15 NOTE — Progress Notes (Signed)
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 male here today for follow-up.  I had seen him initially 1 month ago.  He has very clear-cut right calf claudication.  I explained that the next step would be arteriography for further evaluation and the could potentially be treated with endovascular means and may require open surgical treatment.  We would make this recommendation based on his arteriogram.  Did explain that this is not limb threatening.  He reports that he is unable to tolerate this level of discomfort.  He also reports symptoms of discomfort in his posterior thighs bilaterally with sitting.  I explained that this sounds more neurogenic and would not be related to his arterial flow and therefore would not be directed with any treatment we could offer calf discomfort is limited to him and wishes to proceed.  Past Medical History:  Diagnosis Date  . Diabetes mellitus   . Fatty liver   . GERD (gastroesophageal reflux disease)   . HTN (hypertension)   . Hyperlipidemia     Family History  Problem Relation Age of Onset  . Diabetes Mother   . Diabetes Father   . Diabetes Brother   . Colon cancer Neg Hx   . Liver disease Neg Hx     SOCIAL HISTORY: Social History   Tobacco Use  . Smoking status: Current Every Day Smoker    Packs/day: 0.50    Years: 25.00    Pack years: 12.50    Types: Cigarettes  . Smokeless tobacco: Never Used  Substance Use Topics  . Alcohol use: No    Comment: hx heavy etoh x 3 yrs, quit 30+ yrs ago    No Known Allergies  Current Outpatient Medications  Medication Sig Dispense Refill  . amLODipine (NORVASC) 10 MG tablet Take 1 tablet (10 mg total) by mouth daily. 90 tablet 0  . aspirin EC 81 MG tablet Take 81 mg by mouth daily.     . cloNIDine (CATAPRES) 0.3 MG tablet Take 0.3 mg by mouth at bedtime.    Marland Kitchen. FLUoxetine (PROZAC) 10 MG capsule Take 10 mg by mouth daily.    . Insulin Glargine (TOUJEO SOLOSTAR) 300 UNIT/ML SOPN Inject 15 Units into the skin daily with breakfast. (Patient taking differently: Inject 20 Units into the skin daily with breakfast. ) 3 pen 0  . Insulin Pen Needle (PEN NEEDLES) 32G X 4 MM MISC 1 each by Does not apply route at bedtime. 200 each 2  . lisinopril-hydrochlorothiazide (PRINZIDE,ZESTORETIC) 10-12.5 MG tablet Take 1 tablet by mouth daily.    . ondansetron (ZOFRAN) 4 MG tablet Take 1 tablet (4 mg total) by mouth every 8 (eight) hours as needed for nausea or vomiting. 10 tablet 0  . acetaminophen (TYLENOL) 500 MG tablet Take 500 mg by mouth every 6 (six) hours as needed for moderate pain.     No current facility-administered medications for this visit.     REVIEW OF SYSTEMS:  [X]  denotes positive finding, [ ]  denotes negative finding Cardiac  Comments:  Chest pain or chest pressure:    Shortness of breath upon exertion:    Short of breath when lying flat:    Irregular heart rhythm:        Vascular    Pain in calf, thigh, or hip brought on by ambulation:  x   Pain in feet at night that wakes you up from your sleep:     Blood clot in your veins:    Leg swelling:           PHYSICAL EXAM: Vitals:   08/15/18 1126  BP: (!) 162/66  Temp: 99.5 F (37.5 C)  Weight: 147 lb 2 oz (66.7 kg)  Height: 5\' 7"  (1.702 m)    GENERAL: The patient is a well-nourished male, in no acute distress. The vital signs are documented above. CARDIOVASCULAR: Palpable right pedal pulses. PULMONARY: There is good air exchange  MUSCULOSKELETAL: There are no major deformities or cyanosis. NEUROLOGIC: No focal weakness or paresthesias are detected. SKIN: There are no ulcers or rashes noted. PSYCHIATRIC: The patient has a normal affect.  DATA:  Invasive studies again reviewed showing ankle arm index of 0.6  on the and normal on the left  MEDICAL ISSUES: Eating claudication right leg.  Potentially related to right iliac or SFA occlusive disease.  Will schedule outpatient diagnostic arteriogram.  Explained that he would potentially have endovascular treatment if appropriate at the same setting.  If not would be have further discussion for surgical indications.    Larina Earthly, MD FACS [facility] of [facility] Tel [phone] Pager [phone]

## 2018-08-16 ENCOUNTER — Other Ambulatory Visit: Payer: Self-pay

## 2018-08-16 ENCOUNTER — Telehealth: Payer: Self-pay

## 2018-08-16 NOTE — Telephone Encounter (Signed)
Spoke with patient to schedule his surgery for 12/9. Pt. Was given instructions for taking half his dose of insulin in morning, as well as Lisinopril/HCT and Norvasc. He understands he is also supposed to eat a high protein snack the night before and then remain NPO after midnight. Pt. Verbalized understanding and states no further questions at this time.

## 2018-08-22 ENCOUNTER — Other Ambulatory Visit: Payer: Self-pay

## 2018-08-22 ENCOUNTER — Ambulatory Visit (HOSPITAL_COMMUNITY)
Admission: RE | Admit: 2018-08-22 | Discharge: 2018-08-22 | Disposition: A | Discharge status: Home / Self Care | Payer: Medicare Other | Attending: Vascular Surgery | Admitting: Vascular Surgery

## 2018-08-22 ENCOUNTER — Telehealth: Payer: Self-pay | Admitting: Vascular Surgery

## 2018-08-22 ENCOUNTER — Encounter (HOSPITAL_COMMUNITY): Payer: Self-pay | Admitting: Vascular Surgery

## 2018-08-22 ENCOUNTER — Encounter (HOSPITAL_COMMUNITY)
Admission: RE | Disposition: A | Discharge status: Home / Self Care | Payer: Self-pay | Source: Home / Self Care | Attending: Vascular Surgery

## 2018-08-22 DIAGNOSIS — Z7982 Long term (current) use of aspirin: Secondary | ICD-10-CM | POA: Diagnosis not present

## 2018-08-22 DIAGNOSIS — Z794 Long term (current) use of insulin: Secondary | ICD-10-CM | POA: Insufficient documentation

## 2018-08-22 DIAGNOSIS — E119 Type 2 diabetes mellitus without complications: Secondary | ICD-10-CM | POA: Insufficient documentation

## 2018-08-22 DIAGNOSIS — K219 Gastro-esophageal reflux disease without esophagitis: Secondary | ICD-10-CM | POA: Diagnosis not present

## 2018-08-22 DIAGNOSIS — Z79899 Other long term (current) drug therapy: Secondary | ICD-10-CM | POA: Insufficient documentation

## 2018-08-22 DIAGNOSIS — Z833 Family history of diabetes mellitus: Secondary | ICD-10-CM | POA: Diagnosis not present

## 2018-08-22 DIAGNOSIS — E785 Hyperlipidemia, unspecified: Secondary | ICD-10-CM | POA: Diagnosis not present

## 2018-08-22 DIAGNOSIS — F17210 Nicotine dependence, cigarettes, uncomplicated: Secondary | ICD-10-CM | POA: Diagnosis not present

## 2018-08-22 DIAGNOSIS — K769 Liver disease, unspecified: Secondary | ICD-10-CM | POA: Diagnosis not present

## 2018-08-22 DIAGNOSIS — I70213 Atherosclerosis of native arteries of extremities with intermittent claudication, bilateral legs: Secondary | ICD-10-CM | POA: Diagnosis not present

## 2018-08-22 DIAGNOSIS — I10 Essential (primary) hypertension: Secondary | ICD-10-CM | POA: Diagnosis not present

## 2018-08-22 HISTORY — PX: ABDOMINAL AORTOGRAM W/LOWER EXTREMITY: CATH118223

## 2018-08-22 HISTORY — PX: PERIPHERAL VASCULAR INTERVENTION: CATH118257

## 2018-08-22 LAB — POCT I-STAT, CHEM 8
BUN: 18 mg/dL (ref 8–23)
CHLORIDE: 105 mmol/L (ref 98–111)
Calcium, Ion: 1.19 mmol/L (ref 1.15–1.40)
Creatinine, Ser: 1.5 mg/dL — ABNORMAL HIGH (ref 0.61–1.24)
GLUCOSE: 224 mg/dL — AB (ref 70–99)
HCT: 44 % (ref 39.0–52.0)
Hemoglobin: 15 g/dL (ref 13.0–17.0)
POTASSIUM: 3.7 mmol/L (ref 3.5–5.1)
Sodium: 139 mmol/L (ref 135–145)
TCO2: 25 mmol/L (ref 22–32)

## 2018-08-22 LAB — GLUCOSE, CAPILLARY: Glucose-Capillary: 169 mg/dL — ABNORMAL HIGH (ref 70–99)

## 2018-08-22 SURGERY — ABDOMINAL AORTOGRAM W/LOWER EXTREMITY
Anesthesia: LOCAL

## 2018-08-22 MED ORDER — LABETALOL HCL 5 MG/ML IV SOLN: 10.0000 mg | INTRAVENOUS | Status: DC | PRN

## 2018-08-22 MED ORDER — HEPARIN (PORCINE) IN NACL 1000-0.9 UT/500ML-% IV SOLN
INTRAVENOUS | Status: AC
  Filled 2018-08-22: qty 500

## 2018-08-22 MED ORDER — MIDAZOLAM HCL 2 MG/2ML IJ SOLN
INTRAMUSCULAR | Status: DC | PRN
  Administered 2018-08-22: 2 mg via INTRAVENOUS

## 2018-08-22 MED ORDER — ACETAMINOPHEN 325 MG PO TABS: 650.0000 mg | ORAL_TABLET | ORAL | Status: DC | PRN

## 2018-08-22 MED ORDER — HEPARIN (PORCINE) IN NACL 1000-0.9 UT/500ML-% IV SOLN
INTRAVENOUS | Status: DC | PRN
  Administered 2018-08-22 (×2): 500 mL

## 2018-08-22 MED ORDER — SODIUM CHLORIDE 0.9 % IV SOLN
INTRAVENOUS | Status: DC
  Administered 2018-08-22: 06:00:00 via INTRAVENOUS

## 2018-08-22 MED ORDER — LIDOCAINE HCL (PF) 1 % IJ SOLN
INTRAMUSCULAR | Status: DC | PRN
  Administered 2018-08-22: 15 mL

## 2018-08-22 MED ORDER — LIDOCAINE HCL (PF) 1 % IJ SOLN
INTRAMUSCULAR | Status: AC
  Filled 2018-08-22: qty 30

## 2018-08-22 MED ORDER — SODIUM CHLORIDE 0.9% FLUSH: 3.0000 mL | Freq: Two times a day (BID) | INTRAVENOUS | Status: DC

## 2018-08-22 MED ORDER — SODIUM CHLORIDE 0.9 % WEIGHT BASED INFUSION: 1.0000 mL/kg/h | INTRAVENOUS | Status: DC

## 2018-08-22 MED ORDER — CLOPIDOGREL BISULFATE 300 MG PO TABS
ORAL_TABLET | ORAL | Status: AC
  Filled 2018-08-22: qty 1

## 2018-08-22 MED ORDER — CLOPIDOGREL BISULFATE 300 MG PO TABS
ORAL_TABLET | ORAL | Status: DC | PRN
  Administered 2018-08-22: 300 mg via ORAL

## 2018-08-22 MED ORDER — MIDAZOLAM HCL 2 MG/2ML IJ SOLN
INTRAMUSCULAR | Status: AC
  Filled 2018-08-22: qty 2

## 2018-08-22 MED ORDER — FENTANYL CITRATE (PF) 100 MCG/2ML IJ SOLN
INTRAMUSCULAR | Status: AC
  Filled 2018-08-22: qty 2

## 2018-08-22 MED ORDER — CLOPIDOGREL BISULFATE 75 MG PO TABS: 75.0000 mg | ORAL_TABLET | Freq: Every day | ORAL | Status: DC

## 2018-08-22 MED ORDER — OXYCODONE HCL 5 MG PO TABS: 5.0000 mg | ORAL_TABLET | ORAL | Status: DC | PRN

## 2018-08-22 MED ORDER — HEPARIN SODIUM (PORCINE) 1000 UNIT/ML IJ SOLN
INTRAMUSCULAR | Status: AC
  Filled 2018-08-22: qty 1

## 2018-08-22 MED ORDER — FENTANYL CITRATE (PF) 100 MCG/2ML IJ SOLN
INTRAMUSCULAR | Status: DC | PRN
  Administered 2018-08-22: 50 ug via INTRAVENOUS

## 2018-08-22 MED ORDER — IODIXANOL 320 MG/ML IV SOLN
INTRAVENOUS | Status: DC | PRN
  Administered 2018-08-22: 130 mL via INTRA_ARTERIAL

## 2018-08-22 MED ORDER — SODIUM CHLORIDE 0.9 % IV SOLN: 250.0000 mL | INTRAVENOUS | Status: DC | PRN

## 2018-08-22 MED ORDER — HEPARIN SODIUM (PORCINE) 1000 UNIT/ML IJ SOLN
INTRAMUSCULAR | Status: DC | PRN
  Administered 2018-08-22: 6000 [IU] via INTRAVENOUS

## 2018-08-22 MED ORDER — CLOPIDOGREL BISULFATE 75 MG PO TABS: 300.0000 mg | ORAL_TABLET | Freq: Once | ORAL | Status: DC

## 2018-08-22 MED ORDER — HYDRALAZINE HCL 20 MG/ML IJ SOLN: 5.0000 mg | INTRAMUSCULAR | Status: DC | PRN

## 2018-08-22 MED ORDER — CLOPIDOGREL BISULFATE 75 MG PO TABS: 75.0000 mg | ORAL_TABLET | Freq: Every day | ORAL | 3 refills | Status: DC

## 2018-08-22 MED ORDER — ONDANSETRON HCL 4 MG/2ML IJ SOLN: 4.0000 mg | Freq: Four times a day (QID) | INTRAMUSCULAR | Status: DC | PRN

## 2018-08-22 MED ORDER — SODIUM CHLORIDE 0.9% FLUSH: 3.0000 mL | INTRAVENOUS | Status: DC | PRN

## 2018-08-22 SURGICAL SUPPLY — 19 items
BALLN MUSTANG 6.0X40 75 (BALLOONS) ×3
BALLN MUSTANG 6X60X75 (BALLOONS) ×3
BALLOON MUSTANG 6.0X40 75 (BALLOONS) IMPLANT
BALLOON MUSTANG 6X60X75 (BALLOONS) IMPLANT
CATH OMNI FLUSH 5F 65CM (CATHETERS) ×1 IMPLANT
CLOSURE MYNX CONTROL 6F/7F (Vascular Products) ×1 IMPLANT
KIT MICROPUNCTURE NIT STIFF (SHEATH) ×1 IMPLANT
KIT PV (KITS) ×3 IMPLANT
SHEATH FLEX ANSEL ANG 6F 45CM (SHEATH) ×1 IMPLANT
SHEATH PINNACLE 5F 10CM (SHEATH) ×1 IMPLANT
SHEATH PINNACLE 6F 10CM (SHEATH) ×1 IMPLANT
SHEATH PROBE COVER 6X72 (BAG) ×1 IMPLANT
STENT INNOVA 7X40X130 (Permanent Stent) ×1 IMPLANT
STENT INNOVA 7X60X130 (Permanent Stent) ×1 IMPLANT
SYR MEDRAD MARK V 150ML (SYRINGE) ×1 IMPLANT
TRANSDUCER W/STOPCOCK (MISCELLANEOUS) ×3 IMPLANT
TRAY PV CATH (CUSTOM PROCEDURE TRAY) ×3 IMPLANT
WIRE BENTSON .035X145CM (WIRE) ×1 IMPLANT
WIRE ROSEN-J .035X260CM (WIRE) ×1 IMPLANT

## 2018-08-22 NOTE — Telephone Encounter (Signed)
sch appt spk to pt wife mld ltr 10/11/2018 10am Aorta iliac 11am ABI 1130am p/o MD

## 2018-08-22 NOTE — Discharge Instructions (Signed)
Femoral Site Care °Refer to this sheet in the next few weeks. These instructions provide you with information about caring for yourself after your procedure. Your health care provider may also give you more specific instructions. Your treatment has been planned according to current medical practices, but problems sometimes occur. Call your health care provider if you have any problems or questions after your procedure. °What can I expect after the procedure? °After your procedure, it is typical to have the following: °· Bruising at the site that usually fades within 1-2 weeks. °· Blood collecting in the tissue (hematoma) that may be painful to the touch. It should usually decrease in size and tenderness within 1-2 weeks. ° °Follow these instructions at home: °· Take medicines only as directed by your health care provider. °· You may shower 24-48 hours after the procedure or as directed by your health care provider. Remove the bandage (dressing) and gently wash the site with plain soap and water. Pat the area dry with a clean towel. Do not rub the site, because this may cause bleeding. °· Do not take baths, swim, or use a hot tub until your health care provider approves. °· Check your insertion site every day for redness, swelling, or drainage. °· Do not apply powder or lotion to the site. °· Limit use of stairs to twice a day for the first 2-3 days or as directed by your health care provider. °· Do not squat for the first 2-3 days or as directed by your health care provider. °· Do not lift over 10 lb (4.5 kg) for 5 days after your procedure or as directed by your health care provider. °· Ask your health care provider when it is okay to: °? Return to work or school. °? Resume usual physical activities or sports. °? Resume sexual activity. °· Do not drive home if you are discharged the same day as the procedure. Have someone else drive you. °· You may drive 24 hours after the procedure unless otherwise instructed by  your health care provider. °· Do not operate machinery or power tools for 24 hours after the procedure or as directed by your health care provider. °· If your procedure was done as an outpatient procedure, which means that you went home the same day as your procedure, a responsible adult should be with you for the first 24 hours after you arrive home. °· Keep all follow-up visits as directed by your health care provider. This is important. °Contact a health care provider if: °· You have a fever. °· You have chills. °· You have increased bleeding from the site. Hold pressure on the site. °Get help right away if: °· You have unusual pain at the site. °· You have redness, warmth, or swelling at the site. °· You have drainage (other than a small amount of blood on the dressing) from the site. °· The site is bleeding, and the bleeding does not stop after 30 minutes of holding steady pressure on the site. °· Your leg or foot becomes pale, cool, tingly, or numb. °This information is not intended to replace advice given to you by your health care provider. Make sure you discuss any questions you have with your health care provider. °Document Released: 05/04/2014 Document Revised: 02/06/2016 Document Reviewed: 03/20/2014 °Elsevier Interactive Patient Education © 2018 [employer] °Moderate Conscious Sedation, Adult, Care After °These instructions provide you with information about caring for yourself after your procedure. Your health care provider may also give you more   specific instructions. Your treatment has been planned according to current medical practices, but problems sometimes occur. Call your health care provider if you have any problems or questions after your procedure. °What can I expect after the procedure? °After your procedure, it is common: °· To feel sleepy for several hours. °· To feel clumsy and have poor balance for several hours. °· To have poor judgment for several hours. °· To vomit if you eat too  soon. ° °Follow these instructions at home: °For at least 24 hours after the procedure: ° °· Do not: °? Participate in activities where you could fall or become injured. °? Drive. °? Use heavy machinery. °? Drink alcohol. °? Take sleeping pills or medicines that cause drowsiness. °? Make important decisions or sign legal documents. °? Take care of children on your own. °· Rest. °Eating and drinking °· Follow the diet recommended by your health care provider. °· If you vomit: °? Drink water, juice, or soup when you can drink without vomiting. °? Make sure you have little or no nausea before eating solid foods. °General instructions °· Have a responsible adult stay with you until you are awake and alert. °· Take over-the-counter and prescription medicines only as told by your health care provider. °· If you smoke, do not smoke without supervision. °· Keep all follow-up visits as told by your health care provider. This is important. °Contact a health care provider if: °· You keep feeling nauseous or you keep vomiting. °· You feel light-headed. °· You develop a rash. °· You have a fever. °Get help right away if: °· You have trouble breathing. °This information is not intended to replace advice given to you by your health care provider. Make sure you discuss any questions you have with your health care provider. °Document Released: 06/21/2013 Document Revised: 02/03/2016 Document Reviewed: 12/21/2015 °Elsevier Interactive Patient Education © 2018 Elsevier Inc. ° °

## 2018-08-22 NOTE — Telephone Encounter (Signed)
No additional nursing notes. 

## 2018-08-22 NOTE — Op Note (Signed)
    Patient name: Osborne CascoCalvin E Fregeau MRN: [id number] DOB: October 27, 1950 Sex: male  08/22/2018 Pre-operative Diagnosis: Bilateral lower extremity claudication Post-operative diagnosis:  Same Surgeon:  Apolinar JunesBrandon C. Randie Heinzain, MD Procedure Performed: 1.  Ultrasound-guided cannulation left common femoral artery 2.  Aortogram bilateral lower extremity runoff 3.  Stent of right external iliac artery with 7 x 60 mm Innova 4.  Stent of left external iliac artery with 7 x 40 mm Innova 5.  Minx device closure left common femoral artery 6.  Moderate sedation with fentanyl and Versed for 45 minutes  Indications: 67 year old male with bilateral lower extremity claudication.  He is an every day smoker.  He takes aspirin daily.  He is indicated for angiogram possible intervention.  Findings: Aorta bilateral common iliac arteries were free of disease.  Right external iliac artery was stenosed at 90% at completion 0% residual stenosis and no dissection.  Left external iliac artery stenosis 70% completion 0% residual stenosis and no dissection.  He has no flow-limiting stenosis in his bilateral SFAs or tibial disease but the arteries are very diminutive.   Procedure:  The patient was identified in the holding area and taken to room 8.  The patient was then placed supine on the table and prepped and draped in the usual sterile fashion.  A time out was called.  Ultrasound was used to evaluate left common femoral artery which was noted to be patent.  An image was saved the permanent record.  The area was anesthetized 1% lidocaine.  It was cannulate with direct ultrasound guidance with micropuncture needle followed by wire and sheath.  A Bentson wire was placed an Omni catheter to L1 and aortogram followed by pelvic views followed by bilateral lower extremity runoffs were performed.  We then elected to intervene first on the right.  We were able to cross over the bifurcation with Omni catheter Bentson wire.  We then exchanged  for a Rosen wire.  A long 6 French sheath was placed past the tight stenosis.  We then retracted the sheath placed a 7 x 60 stent deployed this and post ballooned it with 6 x 60 balloon.  There was no residual dissection or stenosis.  We then with directed our sheath performed angled views of the left external iliac artery.  A 7 x 40 mm bare metal stent was then brought into place.  This was deployed postdilated with a 6 x 40 balloon.  Completion showed no residual stenosis or dissection.  We exchanged for short 6 French sheath deployed a minx device.  He tolerated this well without immediate complication  Contrast: 130cc  Marico Buckle C. Randie Heinzain, MD [facility] Office: [phone] Pager: [phone]

## 2018-08-22 NOTE — Progress Notes (Signed)
Up and walked and tolerated well; left groin stable, no bleeding or hematoma 

## 2018-08-22 NOTE — Telephone Encounter (Signed)
-----   Message from Maeola HarmanBrandon Christopher Cain, MD sent at 08/22/2018  8:38 AM EST ----- Chase Briggs [id number] Mar 27, 1951  08/22/2018 Pre-operative Diagnosis: Bilateral lower extremity claudication  Surgeon:  Apolinar JunesBrandon C. Randie Heinzain, MD  Procedure Performed: 1.  Ultrasound-guided cannulation left common femoral artery 2.  Aortogram bilateral lower extremity runoff 3.  Stent of right external iliac artery with 7 x 60 mm Innova 4.  Stent of left external iliac artery with 7 x 40 mm Innova 5.  Minx device closure left common femoral artery 6.  Moderate sedation with fentanyl and Versed for 45 minutes  F/u in 4-6 weeks with np/pa or Dr. Arbie CookeyEarly with ABI and aorto iliac duplex.

## 2018-08-22 NOTE — H&P (Signed)
   History and Physical Update  The patient was interviewed and re-examined.  The patient's previous History and Physical has been reviewed and is unchanged from Dr. Bosie HelperEarly's office visit. Plan for aortogram with bilateral runoff and possible right sided intervention.  Gale Klar C. Randie Heinzain, MD [facility] Office: [phone] Pager: [phone]   08/22/2018, 7:17 AM

## 2018-09-01 ENCOUNTER — Encounter: Payer: Medicare Other | Admitting: Vascular Surgery

## 2018-09-01 ENCOUNTER — Encounter (HOSPITAL_COMMUNITY): Payer: Medicare Other

## 2018-09-09 ENCOUNTER — Telehealth: Payer: Self-pay

## 2018-09-09 NOTE — Telephone Encounter (Signed)
1.  He can increase his Toujeo to 18 units daily at breakfast. 2.  He has to start testing blood glucose 4 times a day-before meals and at bedtime and call back if readings are greater than 2003

## 2018-09-09 NOTE — Telephone Encounter (Signed)
Patient is aware of the recommendation 

## 2018-09-09 NOTE — Telephone Encounter (Signed)
Chase Briggs is stating that his blood sugars have been fluctuating but was on Steroids for 5 days does he need to make any changes, please advise?   Tues 12/24 AM-130 PM-201  Wed 12/25 AM-141 PM-129  Thurs 12/26 AM-128 PM-DIDN'T CHECK  Friday 12/27 AM-367

## 2018-09-11 ENCOUNTER — Encounter (HOSPITAL_COMMUNITY): Payer: Self-pay | Admitting: Emergency Medicine

## 2018-09-11 ENCOUNTER — Other Ambulatory Visit: Payer: Self-pay

## 2018-09-11 ENCOUNTER — Emergency Department (HOSPITAL_COMMUNITY)
Admission: EM | Admit: 2018-09-11 | Discharge: 2018-09-11 | Disposition: A | Discharge status: Home / Self Care | Payer: Medicare Other | Attending: Emergency Medicine | Admitting: Emergency Medicine

## 2018-09-11 DIAGNOSIS — Z5321 Procedure and treatment not carried out due to patient leaving prior to being seen by health care provider: Secondary | ICD-10-CM | POA: Insufficient documentation

## 2018-09-11 DIAGNOSIS — R739 Hyperglycemia, unspecified: Secondary | ICD-10-CM | POA: Diagnosis not present

## 2018-09-11 LAB — CBG MONITORING, ED
Glucose-Capillary: 385 mg/dL — ABNORMAL HIGH (ref 70–99)
Glucose-Capillary: 328 mg/dL — ABNORMAL HIGH (ref 70–99)

## 2018-09-11 NOTE — ED Notes (Signed)
Rechecked fsbs. Glucose went down since prior check. Pt said he is going to go home. Has apt with pcp soon. Will come back if needed.

## 2018-09-11 NOTE — ED Triage Notes (Signed)
PT STATES THAT HIS SUGAR IS ELEVATED PT STATES THAT HE DOES NOT HAVE ANY ENERGY

## 2018-09-12 ENCOUNTER — Telehealth: Payer: Self-pay

## 2018-09-12 NOTE — Telephone Encounter (Signed)
Patient is aware of the recommendation 

## 2018-09-12 NOTE — Telephone Encounter (Signed)
After reviewing the blood sugar logs that Mr. Chase Briggs dropped off Dr. Fransico HimNida suggested that Chase Briggs increase the Toujeo to 22 units at breakfast

## 2018-09-16 ENCOUNTER — Encounter (HOSPITAL_COMMUNITY): Payer: Self-pay

## 2018-09-16 ENCOUNTER — Encounter: Payer: Self-pay | Admitting: "Endocrinology

## 2018-09-16 ENCOUNTER — Other Ambulatory Visit: Payer: Self-pay

## 2018-09-16 ENCOUNTER — Emergency Department (HOSPITAL_COMMUNITY)
Admission: EM | Admit: 2018-09-16 | Discharge: 2018-09-16 | Disposition: A | Discharge status: Home / Self Care | Payer: Medicare Other | Attending: Emergency Medicine | Admitting: Emergency Medicine

## 2018-09-16 ENCOUNTER — Ambulatory Visit (INDEPENDENT_AMBULATORY_CARE_PROVIDER_SITE_OTHER): Payer: Medicare Other | Admitting: "Endocrinology

## 2018-09-16 VITALS — BP 160/74 | HR 78 | Ht 67.0 in | Wt 145.0 lb

## 2018-09-16 DIAGNOSIS — E1122 Type 2 diabetes mellitus with diabetic chronic kidney disease: Secondary | ICD-10-CM | POA: Diagnosis not present

## 2018-09-16 DIAGNOSIS — Z79899 Other long term (current) drug therapy: Secondary | ICD-10-CM | POA: Insufficient documentation

## 2018-09-16 DIAGNOSIS — N183 Chronic kidney disease, stage 3 (moderate): Secondary | ICD-10-CM

## 2018-09-16 DIAGNOSIS — Z794 Long term (current) use of insulin: Secondary | ICD-10-CM | POA: Insufficient documentation

## 2018-09-16 DIAGNOSIS — E1165 Type 2 diabetes mellitus with hyperglycemia: Secondary | ICD-10-CM | POA: Insufficient documentation

## 2018-09-16 DIAGNOSIS — I10 Essential (primary) hypertension: Secondary | ICD-10-CM | POA: Diagnosis not present

## 2018-09-16 DIAGNOSIS — Z7902 Long term (current) use of antithrombotics/antiplatelets: Secondary | ICD-10-CM | POA: Insufficient documentation

## 2018-09-16 DIAGNOSIS — F17210 Nicotine dependence, cigarettes, uncomplicated: Secondary | ICD-10-CM | POA: Diagnosis not present

## 2018-09-16 DIAGNOSIS — Z7982 Long term (current) use of aspirin: Secondary | ICD-10-CM | POA: Insufficient documentation

## 2018-09-16 DIAGNOSIS — IMO0002 Reserved for concepts with insufficient information to code with codable children: Secondary | ICD-10-CM

## 2018-09-16 DIAGNOSIS — F17200 Nicotine dependence, unspecified, uncomplicated: Secondary | ICD-10-CM | POA: Diagnosis not present

## 2018-09-16 DIAGNOSIS — R739 Hyperglycemia, unspecified: Secondary | ICD-10-CM

## 2018-09-16 LAB — CBC WITH DIFFERENTIAL/PLATELET
Abs Immature Granulocytes: 0.02 10*3/uL (ref 0.00–0.07)
Basophils Absolute: 0 10*3/uL (ref 0.0–0.1)
Basophils Relative: 0 %
EOS ABS: 0.1 10*3/uL (ref 0.0–0.5)
Eosinophils Relative: 2 %
HCT: 39.5 % (ref 39.0–52.0)
Hemoglobin: 12.8 g/dL — ABNORMAL LOW (ref 13.0–17.0)
Immature Granulocytes: 0 %
LYMPHS ABS: 2.3 10*3/uL (ref 0.7–4.0)
Lymphocytes Relative: 31 %
MCH: 30.6 pg (ref 26.0–34.0)
MCHC: 32.4 g/dL (ref 30.0–36.0)
MCV: 94.5 fL (ref 80.0–100.0)
Monocytes Absolute: 0.6 10*3/uL (ref 0.1–1.0)
Monocytes Relative: 8 %
NRBC: 0 % (ref 0.0–0.2)
Neutro Abs: 4.2 10*3/uL (ref 1.7–7.7)
Neutrophils Relative %: 59 %
Platelets: 202 10*3/uL (ref 150–400)
RBC: 4.18 MIL/uL — ABNORMAL LOW (ref 4.22–5.81)
RDW: 12.6 % (ref 11.5–15.5)
WBC: 7.2 10*3/uL (ref 4.0–10.5)

## 2018-09-16 LAB — BASIC METABOLIC PANEL
Anion gap: 9 (ref 5–15)
BUN: 17 mg/dL (ref 8–23)
CO2: 22 mmol/L (ref 22–32)
Calcium: 9.2 mg/dL (ref 8.9–10.3)
Chloride: 102 mmol/L (ref 98–111)
Creatinine, Ser: 1.51 mg/dL — ABNORMAL HIGH (ref 0.61–1.24)
GFR calc Af Amer: 55 mL/min — ABNORMAL LOW (ref >60)
GFR calc non Af Amer: 47 mL/min — ABNORMAL LOW (ref >60)
Glucose, Bld: 327 mg/dL — ABNORMAL HIGH (ref 70–99)
Potassium: 4 mmol/L (ref 3.5–5.1)
Sodium: 133 mmol/L — ABNORMAL LOW (ref 135–145)

## 2018-09-16 LAB — URINALYSIS, ROUTINE W REFLEX MICROSCOPIC
Bilirubin Urine: NEGATIVE
Glucose, UA: >=500 mg/dL — AB
Ketones, ur: NEGATIVE mg/dL
Leukocytes, UA: NEGATIVE
Nitrite: NEGATIVE
PH: 6 (ref 5.0–8.0)
Protein, ur: NEGATIVE mg/dL
Specific Gravity, Urine: 1.005 (ref 1.005–1.030)

## 2018-09-16 LAB — CBG MONITORING, ED
Glucose-Capillary: 338 mg/dL — ABNORMAL HIGH (ref 70–99)
Glucose-Capillary: 271 mg/dL — ABNORMAL HIGH (ref 70–99)

## 2018-09-16 MED ORDER — SODIUM CHLORIDE 0.9 % IV BOLUS
500.0000 mL | Freq: Once | INTRAVENOUS | Status: AC
  Administered 2018-09-16: 500 mL via INTRAVENOUS

## 2018-09-16 NOTE — ED Provider Notes (Signed)
[facility] EMERGENCY DEPARTMENT Provider Note   CSN: [id number] Arrival date & time: 09/16/18  0830     History   Chief Complaint Chief Complaint  Patient presents with  . Hyperglycemia    HPI Chase Briggs is a 68 y.o. male.  HPI Patient presents with hyperglycemia.  Over the last 2 weeks his sugars been elevated and his endocrinologist has been increasing his medicines.  He is on Toujeo at 22.  This is recently been increased.  However sugars are still been running high.  Up to 465.  Has had some mild thirst but otherwise patient states he feels well.  Was able to sleep all night last night without getting up to urinate.  He had been on steroids for wrist pain but finished that up around 2 weeks ago. Past Medical History:  Diagnosis Date  . Diabetes mellitus   . Fatty liver   . GERD (gastroesophageal reflux disease)   . HTN (hypertension)   . Hyperlipidemia     Patient Active Problem List   Diagnosis Date Noted  . Current smoker 08/31/2017  . Mixed hyperlipidemia 08/27/2015  . Vitamin D deficiency 08/27/2015  . Uncontrolled type 2 diabetes mellitus with stage 3 chronic kidney disease, without long-term current use of insulin (HCC) 02/20/2014  . Severe protein-calorie malnutrition (HCC) 02/20/2014  . Fatty liver 09/21/2012  . Weight loss 09/21/2012  . Sternum pain 05/20/2011  . Screen for colon cancer 05/20/2011  . RUQ pain 01/07/2011  . Essential hypertension 01/07/2011    Past Surgical History:  Procedure Laterality Date  . ABDOMINAL AORTOGRAM W/LOWER EXTREMITY N/A 08/22/2018   Procedure: ABDOMINAL AORTOGRAM W/LOWER EXTREMITY;  Surgeon: [name], MD;  Location: [facility] INVASIVE CV LAB;  Service: Cardiovascular;  Laterality: N/A;  . BACK SURGERY    . COLONOSCOPY  01/2007   Dr. [name], reviewed report, mild diverticulosis. Prep adequate. Next TCS due 01/2017  . ESOPHAGOGASTRODUODENOSCOPY N/A 10/21/2012   UMP:NTIRWERX GASTRITIS  . MASS EXCISION  Right 10/26/2012   Procedure: EXCISION MASS;  Surgeon: [name], MD;  Location: [facility] ORS;  Service: General;  Laterality: Right;  . PERIPHERAL VASCULAR INTERVENTION  08/22/2018   Procedure: PERIPHERAL VASCULAR INTERVENTION;  Surgeon: [name], MD;  Location: [facility] INVASIVE CV LAB;  Service: Cardiovascular;;  bilateral external iliac stents  . scalp mass  4-5 yrs ago   Dr [name]        Home Medications    Prior to Admission medications   Medication Sig Start Date End Date Taking? Authorizing Provider  acetaminophen (TYLENOL) 500 MG tablet Take 500 mg by mouth every 6 (six) hours as needed for moderate pain.    [provider]  amLODipine (NORVASC) 10 MG tablet Take 1 tablet (10 mg total) by mouth daily. 10/03/15   Roma Kayser, MD  aspirin EC 81 MG tablet Take 81 mg by mouth daily.    [provider]  cholecalciferol (VITAMIN D3) 25 MCG (1000 UT) tablet Take 1,000 Units by mouth daily.    [provider]  cloNIDine (CATAPRES) 0.3 MG tablet Take 0.3 mg by mouth at bedtime.    [provider]  clopidogrel (PLAVIX) 75 MG tablet Take 1 tablet (75 mg total) by mouth daily. 08/22/18 08/22/19  [name], MD  Ferrous Sulfate (IRON) 325 (65 Fe) MG TABS Take 325 mg by mouth daily.    [provider]  FLUoxetine (PROZAC) 10 MG capsule Take 10 mg by mouth 2 (two) times  a week.  10/06/17   [provider]  Insulin Glargine, 1 Unit Dial, 300 UNIT/ML SOPN Inject 30 Units into the skin daily with breakfast.    [provider]  Insulin Pen Needle (PEN NEEDLES) 32G X 4 MM MISC 1 each by Does not apply route at bedtime. 08/09/18   Roma KayserNida, Gebreselassie W, MD  lisinopril-hydrochlorothiazide (PRINZIDE,ZESTORETIC) 10-12.5 MG tablet Take 1 tablet by mouth daily. 10/06/17   [provider]  mirtazapine (REMERON) 15 MG tablet Take 7.5 mg by mouth daily.    [provider]  traZODone (DESYREL) 50 MG  tablet Take 50 mg by mouth at bedtime. 07/25/18   [provider]    Family History Family History  Problem Relation Age of Onset  . Diabetes Mother   . Diabetes Father   . Diabetes Brother   . Colon cancer Neg Hx   . Liver disease Neg Hx     Social History Social History   Tobacco Use  . Smoking status: Current Every Day Smoker    Packs/day: 0.50    Years: 25.00    Pack years: 12.50    Types: Cigarettes  . Smokeless tobacco: Never Used  Substance Use Topics  . Alcohol use: No    Comment: hx heavy etoh x 3 yrs, quit 30+ yrs ago  . Drug use: No     Allergies   Patient has no known allergies.   Review of Systems Review of Systems  Constitutional: Negative for appetite change.  HENT: Negative for congestion.   Respiratory: Negative for shortness of breath.   Cardiovascular: Negative for chest pain.  Gastrointestinal: Negative for abdominal distention.  Endocrine: Positive for polydipsia. Negative for polyphagia and polyuria.  Genitourinary: Negative for flank pain.  Musculoskeletal: Negative for arthralgias.  Skin: Negative for rash.  Neurological: Negative for weakness.     Physical Exam Updated Vital Signs BP (!) 146/73   Pulse (!) 48   Temp 98.1 F (36.7 C) (Oral)   Resp 18   Ht 5\' 7"  (1.702 m)   Wt 66.2 kg   SpO2 100%   BMI 22.87 kg/m   Physical Exam Vitals signs and nursing note reviewed.  HENT:     Head: Atraumatic.  Cardiovascular:     Rate and Rhythm: Normal rate and regular rhythm.  Pulmonary:     Effort: Pulmonary effort is normal.  Abdominal:     General: There is no distension.  Musculoskeletal:     Right lower leg: No edema.     Left lower leg: No edema.  Skin:    General: Skin is warm.     Capillary Refill: Capillary refill takes less than 2 seconds.  Neurological:     General: No focal deficit present.     Mental Status: He is alert.  Psychiatric:        Mood and Affect: Mood normal.      ED Treatments /  Results  Labs (all labs ordered are listed, but only abnormal results are displayed) Labs Reviewed  BASIC METABOLIC PANEL - Abnormal; Notable for the following components:      Result Value   Sodium 133 (*)    Glucose, Bld 327 (*)    Creatinine, Ser 1.51 (*)    GFR calc non Af Amer 47 (*)    GFR calc Af Amer 55 (*)    All other components within normal limits  CBC WITH DIFFERENTIAL/PLATELET - Abnormal; Notable for the following components:   RBC 4.18 (*)  Hemoglobin 12.8 (*)    All other components within normal limits  URINALYSIS, ROUTINE W REFLEX MICROSCOPIC - Abnormal; Notable for the following components:   Color, Urine STRAW (*)    Glucose, UA >=500 (*)    Hgb urine dipstick SMALL (*)    Bacteria, UA RARE (*)    All other components within normal limits  CBG MONITORING, ED - Abnormal; Notable for the following components:   Glucose-Capillary 338 (*)    All other components within normal limits  CBG MONITORING, ED - Abnormal; Notable for the following components:   Glucose-Capillary 271 (*)    All other components within normal limits    EKG None  Radiology No results found.  Procedures Procedures (including critical care time)  Medications Ordered in ED Medications  sodium chloride 0.9 % bolus 500 mL (0 mLs Intravenous Stopped 09/16/18 1016)     Initial Impression / Assessment and Plan / ED Course  I have reviewed the triage vital signs and the nursing notes.  Pertinent labs & imaging results that were available during my care of the patient were reviewed by me and considered in my medical decision making (see chart for details).     Patient with hyperglycemia.  Not DKA.  Had been on steroids up until a couple weeks ago but still has sugars that are high.  Fluid is helped some.  Will follow with his endocrinologist about further adjustment of his medication.  Final Clinical Impressions(s) / ED Diagnoses   Final diagnoses:  Hyperglycemia    ED Discharge  Orders    None       Benjiman Core, MD 09/16/18 [phone]

## 2018-09-16 NOTE — ED Triage Notes (Signed)
Pt reports blood sugar has been elevated for the past 2 weeks.   REports has been as high as 465.  PT says is on insulin and is taking it as prescribed.  Denies any symptoms at this time.

## 2018-09-16 NOTE — Progress Notes (Signed)
Subjective:    Patient ID: Chase Briggs, male    DOB: [birth year], PCP Elenore Paddy, NP   Past Medical History:  Diagnosis Date  . Diabetes mellitus   . Fatty liver   . GERD (gastroesophageal reflux disease)   . HTN (hypertension)   . Hyperlipidemia    Past Surgical History:  Procedure Laterality Date  . ABDOMINAL AORTOGRAM W/LOWER EXTREMITY N/A 08/22/2018   Procedure: ABDOMINAL AORTOGRAM W/LOWER EXTREMITY;  Surgeon: [name], MD;  Location: [facility] INVASIVE CV LAB;  Service: Cardiovascular;  Laterality: N/A;  . BACK SURGERY    . COLONOSCOPY  01/2007   Dr. [name], reviewed report, mild diverticulosis. Prep adequate. Next TCS due 01/2017  . ESOPHAGOGASTRODUODENOSCOPY N/A 10/21/2012   WGN:FAOZHYQM GASTRITIS  . MASS EXCISION Right 10/26/2012   Procedure: EXCISION MASS;  Surgeon: [name], MD;  Location: [facility];  Service: General;  Laterality: Right;  . PERIPHERAL VASCULAR INTERVENTION  08/22/2018   Procedure: PERIPHERAL VASCULAR INTERVENTION;  Surgeon: [name], MD;  Location: [facility] INVASIVE CV LAB;  Service: Cardiovascular;;  bilateral external iliac stents  . scalp mass  4-5 yrs ago   Dr [name]   Social History   Socioeconomic History  . Marital status: Married    Spouse name: Not on file  . Number of children: 1  . Years of education: Not on file  . Highest education level: Not on file  Occupational History    Comment: [employer], heavy lifting  Social Needs  . Financial resource strain: Not on file  . Food insecurity:    Worry: Not on file    Inability: Not on file  . Transportation needs:    Medical: Not on file    Non-medical: Not on file  Tobacco Use  . Smoking status: Current Every Day Smoker    Packs/day: 0.50    Years: 25.00    Pack years: 12.50    Types: Cigarettes  . Smokeless tobacco: Never Used  Substance and Sexual Activity  . Alcohol use: No    Comment: hx heavy etoh x 3 yrs, quit 30+ yrs ago  . Drug use: No  .  Sexual activity: Yes    Birth control/protection: None  Lifestyle  . Physical activity:    Days per week: Not on file    Minutes per session: Not on file  . Stress: Not on file  Relationships  . Social connections:    Talks on phone: Not on file    Gets together: Not on file    Attends religious service: Not on file    Active member of club or organization: Not on file    Attends meetings of clubs or organizations: Not on file    Relationship status: Not on file  Other Topics Concern  . Not on file  Social History Narrative  . Not on file   Outpatient Encounter Medications as of 09/16/2018  Medication Sig  . Insulin Glargine, 1 Unit Dial, 300 UNIT/ML SOPN Inject 30 Units into the skin daily with breakfast.  . acetaminophen (TYLENOL) 500 MG tablet Take 500 mg by mouth every 6 (six) hours as needed for moderate pain.  Marland Kitchen amLODipine (NORVASC) 10 MG tablet Take 1 tablet (10 mg total) by mouth daily.  Marland Kitchen aspirin EC 81 MG tablet Take 81 mg by mouth daily.  . cholecalciferol (VITAMIN D3) 25 MCG (1000 UT) tablet Take 1,000 Units by mouth daily.  . cloNIDine (CATAPRES) 0.3 MG tablet Take 0.3  mg by mouth at bedtime.  . clopidogrel (PLAVIX) 75 MG tablet Take 1 tablet (75 mg total) by mouth daily.  . Ferrous Sulfate (IRON) 325 (65 Fe) MG TABS Take 325 mg by mouth daily.  Marland Kitchen. FLUoxetine (PROZAC) 10 MG capsule Take 10 mg by mouth 2 (two) times a week.   . Insulin Pen Needle (PEN NEEDLES) 32G X 4 MM MISC 1 each by Does not apply route at bedtime.  Marland Kitchen. lisinopril-hydrochlorothiazide (PRINZIDE,ZESTORETIC) 10-12.5 MG tablet Take 1 tablet by mouth daily.  . mirtazapine (REMERON) 15 MG tablet Take 7.5 mg by mouth daily.  . traZODone (DESYREL) 50 MG tablet Take 50 mg by mouth at bedtime.  . [DISCONTINUED] Insulin Glargine (TOUJEO SOLOSTAR) 300 UNIT/ML SOPN Inject 15 Units into the skin daily with breakfast. (Patient taking differently: Inject 22 Units into the skin daily with breakfast. )   No  facility-administered encounter medications on file as of 09/16/2018.    ALLERGIES: No Known Allergies VACCINATION STATUS:  There is no immunization history on file for this patient.  Diabetes  He presents for his follow-up diabetic visit. He has type 2 diabetes mellitus. Onset time: He was diagnosed at approximate age of 35 years. His disease course has been worsening. There are no hypoglycemic associated symptoms. Pertinent negatives for hypoglycemia include no confusion, headaches, pallor or seizures. There are no diabetic associated symptoms. Pertinent negatives for diabetes include no chest pain, no fatigue, no polydipsia, no polyphagia, no polyuria and no weakness. There are no hypoglycemic complications. Symptoms are worsening. Risk factors for coronary artery disease include diabetes mellitus, dyslipidemia, hypertension, male sex and sedentary lifestyle. He is compliant with treatment most of the time. His weight is stable. He is following a generally unhealthy diet. When asked about meal planning, he reported none. He has not had a previous visit with a dietitian. His home blood glucose trend is increasing steadily. His breakfast blood glucose range is generally >200 mg/dl. His lunch blood glucose range is generally >200 mg/dl. His dinner blood glucose range is generally >200 mg/dl. His bedtime blood glucose range is generally >200 mg/dl. His overall blood glucose range is >200 mg/dl. An ACE inhibitor/angiotensin II receptor blocker is being taken. Eye exam is current.  Hypertension  This is a chronic problem. The current episode started more than 1 year ago. The problem has been gradually improving since onset. The problem is uncontrolled. Pertinent negatives include no chest pain, headaches, neck pain, palpitations or shortness of breath. Risk factors for coronary artery disease include smoking/tobacco exposure, sedentary lifestyle, dyslipidemia, diabetes mellitus and family history. Past  treatments include ACE inhibitors, calcium channel blockers and central alpha agonists. The current treatment provides no improvement. Compliance problems include psychosocial issues.   Hyperlipidemia  This is a chronic problem. The current episode started more than 1 year ago. Exacerbating diseases include diabetes. Pertinent negatives include no chest pain, myalgias or shortness of breath. Current antihyperlipidemic treatment includes statins. Risk factors for coronary artery disease include dyslipidemia, diabetes mellitus, male sex, hypertension and a sedentary lifestyle.     Review of Systems  Constitutional: Negative for fatigue and unexpected weight change.  HENT: Negative for dental problem, mouth sores and trouble swallowing.   Eyes: Negative for visual disturbance.  Respiratory: Negative for cough, choking, chest tightness, shortness of breath and wheezing.   Cardiovascular: Negative for chest pain, palpitations and leg swelling.  Gastrointestinal: Negative for abdominal distention, abdominal pain, constipation, diarrhea, nausea and vomiting.  Endocrine: Negative for polydipsia, polyphagia and polyuria.  Genitourinary: Negative for dysuria, flank pain, hematuria and urgency.  Musculoskeletal: Negative for back pain, gait problem, myalgias and neck pain.  Skin: Negative for pallor, rash and wound.  Neurological: Negative for seizures, syncope, weakness, numbness and headaches.  Psychiatric/Behavioral: Negative.  Negative for confusion and dysphoric mood.    Objective:    BP (!) 160/74   Pulse 78   Ht 5\' 7"  (1.702 m)   Wt 145 lb (65.8 kg)   BMI 22.71 kg/m   Wt Readings from Last 3 Encounters:  09/16/18 146 lb (66.2 kg)  09/16/18 145 lb (65.8 kg)  09/11/18 146 lb (66.2 kg)    Physical Exam  Constitutional: He is oriented to person, place, and time. He appears well-developed. He is cooperative. No distress.  HENT:  Head: Normocephalic and atraumatic.  Eyes: EOM are normal.   Neck: Normal range of motion. Neck supple. No tracheal deviation present. No thyromegaly present.  Cardiovascular: Normal rate, S1 normal and S2 normal. Exam reveals no gallop.  No murmur heard. Pulses:      Dorsalis pedis pulses are 1+ on the right side and 1+ on the left side.       Posterior tibial pulses are 1+ on the right side and 1+ on the left side.  Pulmonary/Chest: Effort normal. No respiratory distress. He has no wheezes.  Abdominal: Bowel sounds are normal. He exhibits no distension. There is no abdominal tenderness. There is no guarding and no CVA tenderness.  Musculoskeletal:        General: No edema.     Right shoulder: He exhibits no swelling and no deformity.  Neurological: He is alert and oriented to person, place, and time. He has normal strength and normal reflexes. No cranial nerve deficit or sensory deficit. Gait normal.  Skin: Skin is warm and dry. No rash noted. No cyanosis. Nails show no clubbing.  Psychiatric: He has a normal mood and affect. His speech is normal. Judgment normal. Cognition and memory are normal.    Complete Blood Count (Most recent): Lab Results  Component Value Date   WBC 7.2 09/16/2018   HGB 12.8 (L) 09/16/2018   HCT 39.5 09/16/2018   MCV 94.5 09/16/2018   PLT 202 09/16/2018   Chemistry (most recent): Lab Results  Component Value Date   NA 133 (L) 09/16/2018   K 4.0 09/16/2018   CL 102 09/16/2018   CO2 22 09/16/2018   BUN 17 09/16/2018   CREATININE 1.51 (H) 09/16/2018   Diabetic Labs (most recent): Lab Results  Component Value Date   HGBA1C 6.7 (H) 02/23/2018   HGBA1C 6.6 (H) 11/22/2017   HGBA1C 8.5 (H) 08/20/2017   Lipid Panel     Component Value Date/Time   CHOL 104 08/20/2017 0725   TRIG 84 08/20/2017 0725   HDL 43 08/20/2017 0725   CHOLHDL 2.4 08/20/2017 0725   LDLCALC 45 08/20/2017 0725     Assessment & Plan:   1. Type 2 diabetes mellitus without complication, without long-term current use of insulin  (HCC)  -He presents with loss of control of glycemia due to recent treatment with high-dose steroids related to his right hand pain.    -His recent A1c was 6.7% overall improving from 12.6%   He Has had hyperosmolar state in the past, patient remains at a high risk for more acute and chronic complications of diabetes which include CAD, CVA, CKD, retinopathy, and neuropathy. These are all discussed in detail with the patient.    Recent labs reviewed,  state 3 renal insufficiency. - I have re-counseled the patient on diet management  by adopting a carbohydrate restricted / protein rich  Diet.  -  Suggestion is made for him to avoid simple carbohydrates  from his diet including Cakes, Sweet Desserts / Pastries, Ice Cream, Soda (diet and regular), Sweet Tea, Candies, Chips, Cookies, Store Bought Juices, Alcohol in Excess of  1-2 drinks a day, Artificial Sweeteners, and "Sugar-free" Products. This will help patient to have stable blood glucose profile and potentially avoid unintended weight gain.   - Patient is advised to stick to a routine mealtimes to eat 3 meals  a day and avoid unnecessary snacks ( to snack only to correct hypoglycemia).  - I have approached patient with the following individualized plan to manage diabetes and patient agrees.  - He has benefited from insulin initiation.   - He is on his presentation with persistent severe hyperglycemia, he is advised to increase Toujeo to 30 units daily at breakfast (to get 10 units extra this morning) , associated with monitoring of blood glucose 4 times a day-daily before breakfast, lunch, supper, and at bedtime.    - He will call clinic if he registers blood glucose readings less than 70 or greater than 200 mg/dL.  -   He has stage 3 renal insufficiency , will stay off of metformin for now. If his renal insufficiency continues, he will need consultation with nephrology.    - Patient is not a candidate for  incretin therapy . - Patient  specific target  for A1c; LDL, HDL, Triglycerides, and  Waist Circumference were discussed in detail.  2) BP/HTN: His blood pressure is uncontrolled.    He has 3 medications including losartan, clonidine, and amlodipine.  He continues to smoke heavily.  He is advised to be consistent taking his medications. - re-counseled him about smoking cessation.   3) Lipids/HPL: Recent lipid panel showed LDL of 45.  He is not on statins at the moment.   4)  Weight/Diet: He has regained 20+ lbs  overall and this is a good development for him.  - He would benefit from addition of more complex carbohydrates, information provided. CDE consult is requested.  5. Vitamin D deficiency - His insurance would not allow for 25 hydroxy vitamin D recheck. - He will continue with vitamin D3 5000 units daily for 90 days.   6) Chronic Care/Health Maintenance:  -Patient is on ACEI/ARB and Statin medications and encouraged to continue to follow up with Ophthalmology, Podiatrist at least yearly or according to recommendations, and advised to  Quit  smoking. I have recommended yearly flu vaccine and pneumonia vaccination at least every 5 years; moderate intensity exercise for up to 150 minutes weekly; and  sleep for at least 7 hours a day.  - I advised patient to maintain close follow up with Elenore Paddy, NP for primary care needs. - I have advised him to avoid over-the-counter pain medications to avoid additional risk for renal insufficiency.  - Time spent with the patient: 25 min, of which >50% was spent in reviewing his blood glucose logs , discussing his hypo- and hyper-glycemic episodes, reviewing his current and  previous labs and insulin doses and developing a plan to avoid hypo- and hyper-glycemia. Please refer to Patient Instructions for Blood Glucose Monitoring and Insulin/Medications Dosing Guide"  in media tab for additional information. Chase Briggs participated in the discussions, expressed  understanding, and voiced agreement with the above plans.  All questions  were answered to his satisfaction. he is encouraged to contact clinic should he have any questions or concerns prior to his return visit.   Follow up plan: -Return Keep his regular appointment.  Marquis Lunch, MD Phone: [phone]  Fax: [phone]  This note was partially dictated with voice recognition software. Similar sounding words can be transcribed inadequately or may not  be corrected upon review.  09/16/2018, 11:51 AM

## 2018-09-19 DIAGNOSIS — R630 Anorexia: Secondary | ICD-10-CM | POA: Diagnosis not present

## 2018-09-19 DIAGNOSIS — R202 Paresthesia of skin: Secondary | ICD-10-CM | POA: Diagnosis not present

## 2018-09-19 DIAGNOSIS — M6740 Ganglion, unspecified site: Secondary | ICD-10-CM | POA: Diagnosis not present

## 2018-09-20 DIAGNOSIS — G5601 Carpal tunnel syndrome, right upper limb: Secondary | ICD-10-CM | POA: Diagnosis not present

## 2018-09-20 DIAGNOSIS — E1142 Type 2 diabetes mellitus with diabetic polyneuropathy: Secondary | ICD-10-CM | POA: Diagnosis not present

## 2018-09-20 DIAGNOSIS — G5622 Lesion of ulnar nerve, left upper limb: Secondary | ICD-10-CM | POA: Diagnosis not present

## 2018-09-28 DIAGNOSIS — E1122 Type 2 diabetes mellitus with diabetic chronic kidney disease: Secondary | ICD-10-CM | POA: Diagnosis not present

## 2018-09-28 DIAGNOSIS — E1165 Type 2 diabetes mellitus with hyperglycemia: Secondary | ICD-10-CM | POA: Diagnosis not present

## 2018-09-28 DIAGNOSIS — N183 Chronic kidney disease, stage 3 (moderate): Secondary | ICD-10-CM | POA: Diagnosis not present

## 2018-09-29 LAB — COMPLETE METABOLIC PANEL WITH GFR
AG RATIO: 1.3 (calc) (ref 1.0–2.5)
ALT: 13 U/L (ref 9–46)
AST: 9 U/L — AB (ref 10–35)
Albumin: 4.1 g/dL (ref 3.6–5.1)
Alkaline phosphatase (APISO): 144 U/L — ABNORMAL HIGH (ref 40–115)
BUN/Creatinine Ratio: 17 (calc) (ref 6–22)
BUN: 28 mg/dL — ABNORMAL HIGH (ref 7–25)
CALCIUM: 9.6 mg/dL (ref 8.6–10.3)
CO2: 25 mmol/L (ref 20–32)
Chloride: 99 mmol/L (ref 98–110)
Creat: 1.65 mg/dL — ABNORMAL HIGH (ref 0.70–1.25)
GFR, EST NON AFRICAN AMERICAN: 42 mL/min/{1.73_m2} — AB (ref >=60)
GFR, Est African American: 49 mL/min/{1.73_m2} — ABNORMAL LOW (ref >=60)
GLOBULIN: 3.1 g/dL (ref 1.9–3.7)
Glucose, Bld: 348 mg/dL — ABNORMAL HIGH (ref 65–99)
Potassium: 4.4 mmol/L (ref 3.5–5.3)
SODIUM: 132 mmol/L — AB (ref 135–146)
TOTAL PROTEIN: 7.2 g/dL (ref 6.1–8.1)
Total Bilirubin: 0.4 mg/dL (ref 0.2–1.2)

## 2018-09-29 LAB — HEMOGLOBIN A1C
HEMOGLOBIN A1C: 10.8 %{Hb} — AB (ref <5.7)
MEAN PLASMA GLUCOSE: 263 (calc)
eAG (mmol/L): 14.6 (calc)

## 2018-10-05 ENCOUNTER — Ambulatory Visit (INDEPENDENT_AMBULATORY_CARE_PROVIDER_SITE_OTHER): Payer: Medicare Other | Admitting: "Endocrinology

## 2018-10-05 ENCOUNTER — Encounter: Payer: Self-pay | Admitting: "Endocrinology

## 2018-10-05 VITALS — BP 158/89 | HR 106 | Ht 67.0 in | Wt 148.0 lb

## 2018-10-05 DIAGNOSIS — E1165 Type 2 diabetes mellitus with hyperglycemia: Secondary | ICD-10-CM | POA: Diagnosis not present

## 2018-10-05 DIAGNOSIS — F17200 Nicotine dependence, unspecified, uncomplicated: Secondary | ICD-10-CM | POA: Diagnosis not present

## 2018-10-05 DIAGNOSIS — I10 Essential (primary) hypertension: Secondary | ICD-10-CM

## 2018-10-05 DIAGNOSIS — E1122 Type 2 diabetes mellitus with diabetic chronic kidney disease: Secondary | ICD-10-CM

## 2018-10-05 DIAGNOSIS — E782 Mixed hyperlipidemia: Secondary | ICD-10-CM

## 2018-10-05 DIAGNOSIS — N183 Chronic kidney disease, stage 3 (moderate): Secondary | ICD-10-CM | POA: Diagnosis not present

## 2018-10-05 DIAGNOSIS — IMO0002 Reserved for concepts with insufficient information to code with codable children: Secondary | ICD-10-CM

## 2018-10-05 NOTE — Progress Notes (Signed)
Subjective:    Patient ID: Chase Briggs, male    DOB: 25-Feb-1951, PCP Elenore PaddyGray, Sarah E, NP   Past Medical History:  Diagnosis Date  . Diabetes mellitus   . Fatty liver   . GERD (gastroesophageal reflux disease)   . HTN (hypertension)   . Hyperlipidemia    Past Surgical History:  Procedure Laterality Date  . ABDOMINAL AORTOGRAM W/LOWER EXTREMITY N/A 08/22/2018   Procedure: ABDOMINAL AORTOGRAM W/LOWER EXTREMITY;  Surgeon: Maeola Harmanain, Brandon Christopher, MD;  Location: [facility];  Service: Cardiovascular;  Laterality: N/A;  . BACK SURGERY    . COLONOSCOPY  01/2007   Dr. Lovell SheehanJenkins, reviewed report, mild diverticulosis. Prep adequate. Next TCS due 01/2017  . ESOPHAGOGASTRODUODENOSCOPY N/A 10/21/2012   PIR:JJOACZYSSLF:MODERATE GASTRITIS  . MASS EXCISION Right 10/26/2012   Procedure: EXCISION MASS;  Surgeon: Dalia HeadingMark A Jenkins, MD;  Location: [facility];  Service: General;  Laterality: Right;  . PERIPHERAL VASCULAR INTERVENTION  08/22/2018   Procedure: PERIPHERAL VASCULAR INTERVENTION;  Surgeon: Maeola Harmanain, Brandon Christopher, MD;  [facility];  Service: Cardiovascular;;  bilateral external iliac stents  . scalp mass  4-5 yrs ago   Dr Lovell SheehanJenkins   Social History   Socioeconomic History  . Marital status: Married    Spouse name: Not on file  . Number of children: 1  . Years of education: Not on file  . Highest education level: Not on file  Occupational History    Comment: [employer], heavy lifting  Social Needs  . Financial resource strain: Not on file  . Food insecurity:    Worry: Not on file    Inability: Not on file  . Transportation needs:    Medical: Not on file    Non-medical: Not on file  Tobacco Use  . Smoking status: Current Every Day Smoker    Packs/day: 0.50    Years: 25.00    Pack years: 12.50    Types: Cigarettes  . Smokeless tobacco: Never Used  Substance and Sexual Activity  . Alcohol use: No    Comment: hx heavy etoh x 3 yrs, quit 30+ yrs ago  . Drug use: No  .  Sexual activity: Yes    Birth control/protection: None  Lifestyle  . Physical activity:    Days per week: Not on file    Minutes per session: Not on file  . Stress: Not on file  Relationships  . Social connections:    Talks on phone: Not on file    Gets together: Not on file    Attends religious service: Not on file    Active member of club or organization: Not on file    Attends meetings of clubs or organizations: Not on file    Relationship status: Not on file  Other Topics Concern  . Not on file  Social History Narrative  . Not on file   Outpatient Encounter Medications as of 10/05/2018  Medication Sig  . amLODipine (NORVASC) 10 MG tablet Take 1 tablet (10 mg total) by mouth daily.  Marland Kitchen. aspirin EC 81 MG tablet Take 81 mg by mouth daily.  . cholecalciferol (VITAMIN D3) 25 MCG (1000 UT) tablet Take 1,000 Units by mouth daily.  . cloNIDine (CATAPRES) 0.3 MG tablet Take 0.3 mg by mouth at bedtime.  . clopidogrel (PLAVIX) 75 MG tablet Take 1 tablet (75 mg total) by mouth daily.  . Ferrous Sulfate (IRON) 325 (65 Fe) MG TABS Take 325 mg by mouth daily.  Marland Kitchen. FLUoxetine (  PROZAC) 10 MG capsule Take 10 mg by mouth 2 (two) times a week.   . Insulin Glargine, 1 Unit Dial, 300 UNIT/ML SOPN Inject 50 Units into the skin at bedtime.  . Insulin Pen Needle (PEN NEEDLES) 32G X 4 MM MISC 1 each by Does not apply route at bedtime.  Marland Kitchen lisinopril-hydrochlorothiazide (PRINZIDE,ZESTORETIC) 10-12.5 MG tablet Take 1 tablet by mouth daily.  . mirtazapine (REMERON) 15 MG tablet Take 7.5 mg by mouth daily.  . traZODone (DESYREL) 50 MG tablet Take 50 mg by mouth at bedtime.  . [DISCONTINUED] acetaminophen (TYLENOL) 500 MG tablet Take 500 mg by mouth every 6 (six) hours as needed for moderate pain.   No facility-administered encounter medications on file as of 10/05/2018.    ALLERGIES: No Known Allergies VACCINATION STATUS:  There is no immunization history on file for this patient.  Diabetes  He presents  for his follow-up diabetic visit. He has type 2 diabetes mellitus. Onset time: He was diagnosed at approximate age of 35 years. His disease course has been worsening. There are no hypoglycemic associated symptoms. Pertinent negatives for hypoglycemia include no confusion, headaches, pallor or seizures. There are no diabetic associated symptoms. Pertinent negatives for diabetes include no chest pain, no fatigue, no polydipsia, no polyphagia, no polyuria and no weakness. There are no hypoglycemic complications. Symptoms are worsening (He recently lost control of his glycemia due to exposure to heavy dose steroids.). Risk factors for coronary artery disease include diabetes mellitus, dyslipidemia, hypertension, male sex and sedentary lifestyle. He is compliant with treatment most of the time. His weight is fluctuating minimally. He is following a generally unhealthy diet. When asked about meal planning, he reported none. He has not had a previous visit with a dietitian. His home blood glucose trend is increasing steadily. His breakfast blood glucose range is generally >200 mg/dl. His lunch blood glucose range is generally >200 mg/dl. His dinner blood glucose range is generally >200 mg/dl. His bedtime blood glucose range is generally >200 mg/dl. His overall blood glucose range is >200 mg/dl. An ACE inhibitor/angiotensin II receptor blocker is being taken. Eye exam is current.  Hypertension  This is a chronic problem. The current episode started more than 1 year ago. The problem has been gradually improving since onset. The problem is uncontrolled. Pertinent negatives include no chest pain, headaches, neck pain, palpitations or shortness of breath. Risk factors for coronary artery disease include smoking/tobacco exposure, sedentary lifestyle, dyslipidemia, diabetes mellitus and family history. Past treatments include ACE inhibitors, calcium channel blockers and central alpha agonists. The current treatment provides no  improvement. Compliance problems include psychosocial issues.   Hyperlipidemia  This is a chronic problem. The current episode started more than 1 year ago. Exacerbating diseases include diabetes. Pertinent negatives include no chest pain, myalgias or shortness of breath. Current antihyperlipidemic treatment includes statins. Risk factors for coronary artery disease include dyslipidemia, diabetes mellitus, male sex, hypertension and a sedentary lifestyle.     Review of Systems  Constitutional: Negative for fatigue and unexpected weight change.  HENT: Negative for dental problem, mouth sores and trouble swallowing.   Eyes: Negative for visual disturbance.  Respiratory: Negative for cough, choking, chest tightness, shortness of breath and wheezing.   Cardiovascular: Negative for chest pain, palpitations and leg swelling.  Gastrointestinal: Negative for abdominal distention, abdominal pain, constipation, diarrhea, nausea and vomiting.  Endocrine: Negative for polydipsia, polyphagia and polyuria.  Genitourinary: Negative for dysuria, flank pain, hematuria and urgency.  Musculoskeletal: Negative for back pain, gait  problem, myalgias and neck pain.  Skin: Negative for pallor, rash and wound.  Neurological: Negative for seizures, syncope, weakness, numbness and headaches.  Psychiatric/Behavioral: Negative.  Negative for confusion and dysphoric mood.    Objective:    BP (!) 158/89   Pulse (!) 106   Ht 5\' 7"  (1.702 m)   Wt 148 lb (67.1 kg)   BMI 23.18 kg/m   Wt Readings from Last 3 Encounters:  10/05/18 148 lb (67.1 kg)  09/16/18 146 lb (66.2 kg)  09/16/18 145 lb (65.8 kg)    Physical Exam  Constitutional: He is oriented to person, place, and time. He appears well-developed. He is cooperative. No distress.  HENT:  Head: Normocephalic and atraumatic.  Eyes: EOM are normal.  Neck: Normal range of motion. Neck supple. No tracheal deviation present. No thyromegaly present.  Cardiovascular:  Normal rate, S1 normal and S2 normal. Exam reveals no gallop.  No murmur heard. Pulses:      Dorsalis pedis pulses are 1+ on the right side and 1+ on the left side.       Posterior tibial pulses are 1+ on the right side and 1+ on the left side.  Pulmonary/Chest: Effort normal. No respiratory distress. He has no wheezes.  Abdominal: Bowel sounds are normal. He exhibits no distension. There is no abdominal tenderness. There is no guarding and no CVA tenderness.  Musculoskeletal:        General: No edema.     Right shoulder: He exhibits no swelling and no deformity.  Neurological: He is alert and oriented to person, place, and time. He has normal strength and normal reflexes. No cranial nerve deficit or sensory deficit. Gait normal.  Skin: Skin is warm and dry. No rash noted. No cyanosis. Nails show no clubbing.  Psychiatric: He has a normal mood and affect. His speech is normal. Judgment normal. Cognition and memory are normal.    Complete Blood Count (Most recent): Lab Results  Component Value Date   WBC 7.2 09/16/2018   HGB 12.8 (L) 09/16/2018   HCT 39.5 09/16/2018   MCV 94.5 09/16/2018   PLT 202 09/16/2018   Chemistry (most recent): Lab Results  Component Value Date   NA 132 (L) 09/28/2018   K 4.4 09/28/2018   CL 99 09/28/2018   CO2 25 09/28/2018   BUN 28 (H) 09/28/2018   CREATININE 1.65 (H) 09/28/2018   Diabetic Labs (most recent): Lab Results  Component Value Date   HGBA1C 10.8 (H) 09/28/2018   HGBA1C 6.7 (H) 02/23/2018   HGBA1C 6.6 (H) 11/22/2017   Lipid Panel     Component Value Date/Time   CHOL 104 08/20/2017 0725   TRIG 84 08/20/2017 0725   HDL 43 08/20/2017 0725   CHOLHDL 2.4 08/20/2017 0725   LDLCALC 45 08/20/2017 0725     Assessment & Plan:   1. Type 2 diabetes mellitus without complication, without long-term current use of insulin (HCC)  -He presents with loss of control of glycemia due to recent treatment with high-dose steroids related to his right  hand pain.    -He recently lost control of glycemia due to high-dose steroids given for pain in his right hand.  His A1c has increased to 10.8% from 6.7%.     He is a chronic heavy smoker, remains at a high risk for more acute and chronic complications of diabetes which include CAD, CVA, CKD, retinopathy, and neuropathy. These are all discussed in detail with the patient.    Recent  labs reviewed, stage 3 renal insufficiency. - I have re-counseled the patient on diet management  by adopting a carbohydrate restricted / protein rich  Diet.  -  Suggestion is made for him to avoid simple carbohydrates  from his diet including Cakes, Sweet Desserts / Pastries, Ice Cream, Soda (diet and regular), Sweet Tea, Candies, Chips, Cookies, Store Bought Juices, Alcohol in Excess of  1-2 drinks a day, Artificial Sweeteners, and "Sugar-free" Products. This will help patient to have stable blood glucose profile and potentially avoid unintended weight gain.  - Patient is advised to stick to a routine mealtimes to eat 3 meals  a day and avoid unnecessary snacks ( to snack only to correct hypoglycemia).  - I have approached patient with the following individualized plan to manage diabetes and patient agrees.  - He has benefited from insulin initiation.   - Based on  his presentation with persistent severe hyperglycemia, he is advised to increase Toujeo to 50 units daily at bedtime, associated with strict monitoring of blood glucose 4 times a day-daily before breakfast, lunch, supper, and at bedtime.    - He will call clinic if he registers blood glucose readings less than 70 or greater than 200 mg/dL.  -   He has stage 3 renal insufficiency , will stay off of metformin for now. If his renal insufficiency continues, he will need consultation with nephrology.    - Patient is not a candidate for  incretin therapy . - Patient specific target  for A1c; LDL, HDL, Triglycerides, and  Waist Circumference were discussed  in detail.  2) BP/HTN: His blood pressure is not controlled to target.     He has 3 medications including losartan, clonidine, and amlodipine.  He continues to smoke heavily.  He is advised to be consistent taking his medications. - re-counseled him about smoking cessation.   3) Lipids/HPL: Recent lipid panel showed LDL of 45.  He is not on statins at the moment.   4)  Weight/Diet: He has regained 20+ lbs  overall and this is a good development for him.  - He would benefit from addition of more complex carbohydrates, information provided. CDE consult is requested.  5. Vitamin D deficiency - His insurance would not allow for 25 hydroxy vitamin D recheck. - He will continue with vitamin D3 5000 units daily for 90 days.   6) Chronic Care/Health Maintenance:  -Patient is on ACEI/ARB and Statin medications and encouraged to continue to follow up with Ophthalmology, Podiatrist at least yearly or according to recommendations, and advised to  Quit  smoking. I have recommended yearly flu vaccine and pneumonia vaccination at least every 5 years; moderate intensity exercise for up to 150 minutes weekly; and  sleep for at least 7 hours a day. -He is counseled extensively for smoking cessation.  - I advised patient to maintain close follow up with Elenore PaddyGray, Sarah E, NP for primary care needs. - I have advised him to avoid over-the-counter pain medications to avoid additional risk for renal insufficiency.  - Time spent with the patient: 25 min, of which >50% was spent in reviewing his blood glucose logs , discussing his hypo- and hyper-glycemic episodes, reviewing his current and  previous labs and insulin doses and developing a plan to avoid hypo- and hyper-glycemia. Please refer to Patient Instructions for Blood Glucose Monitoring and Insulin/Medications Dosing Guide"  in media tab for additional information. Chase Briggs participated in the discussions, expressed understanding, and voiced agreement  with the  above plans.  All questions were answered to his satisfaction. he is encouraged to contact clinic should he have any questions or concerns prior to his return visit.   Follow up plan: -Return in about 3 months (around 01/04/2019) for Meter, and Logs.  Marquis Lunch, MD Phone: [phone]  Fax: [phone]  This note was partially dictated with voice recognition software. Similar sounding words can be transcribed inadequately or may not  be corrected upon review.  10/05/2018, 8:51 AM

## 2018-10-10 ENCOUNTER — Other Ambulatory Visit: Payer: Self-pay

## 2018-10-10 DIAGNOSIS — M79661 Pain in right lower leg: Secondary | ICD-10-CM

## 2018-10-10 DIAGNOSIS — M79662 Pain in left lower leg: Secondary | ICD-10-CM

## 2018-10-10 DIAGNOSIS — I739 Peripheral vascular disease, unspecified: Secondary | ICD-10-CM

## 2018-10-11 ENCOUNTER — Ambulatory Visit (HOSPITAL_COMMUNITY)
Admission: RE | Admit: 2018-10-11 | Discharge: 2018-10-11 | Disposition: A | Discharge status: Home / Self Care | Payer: Medicare Other | Source: Ambulatory Visit | Attending: Vascular Surgery | Admitting: Vascular Surgery

## 2018-10-11 ENCOUNTER — Ambulatory Visit (INDEPENDENT_AMBULATORY_CARE_PROVIDER_SITE_OTHER): Payer: Medicare Other | Admitting: Vascular Surgery

## 2018-10-11 ENCOUNTER — Other Ambulatory Visit: Payer: Self-pay

## 2018-10-11 ENCOUNTER — Ambulatory Visit (INDEPENDENT_AMBULATORY_CARE_PROVIDER_SITE_OTHER)
Admit: 2018-10-11 | Discharge: 2018-10-11 | Disposition: A | Discharge status: Home / Self Care | Payer: Medicare Other | Attending: Vascular Surgery | Admitting: Vascular Surgery

## 2018-10-11 ENCOUNTER — Encounter: Payer: Self-pay | Admitting: Vascular Surgery

## 2018-10-11 VITALS — BP 137/75 | HR 68 | Temp 97.8°F | Resp 16 | Ht 67.0 in | Wt 146.3 lb

## 2018-10-11 DIAGNOSIS — M79661 Pain in right lower leg: Secondary | ICD-10-CM | POA: Diagnosis not present

## 2018-10-11 DIAGNOSIS — I739 Peripheral vascular disease, unspecified: Secondary | ICD-10-CM

## 2018-10-11 DIAGNOSIS — M79662 Pain in left lower leg: Secondary | ICD-10-CM

## 2018-10-11 NOTE — Progress Notes (Signed)
[facility]  Patient name: Chase Briggs MRN: [id number] DOB: 02-22-1951 Sex: male  REASON FOR VISIT: Follow-up recent bilateral external iliac artery angioplasty with Dr. [name] on 08/22/2018.  HPI: Chase Briggs is a 68 y.o. male here today for follow-up.  He reports complete resolution of his lower extremity thigh and calf claudication.  He does report some pain in his hips with walking.  I suspect this is more related to arthritis or degenerative disc disease.  He did not have any difficulty with his puncture site.  Past Medical History:  Diagnosis Date  . Diabetes mellitus   . Fatty liver   . GERD (gastroesophageal reflux disease)   . HTN (hypertension)   . Hyperlipidemia     Family History  Problem Relation Age of Onset  . Diabetes Mother   . Diabetes Father   . Diabetes Brother   . Colon cancer Neg Hx   . Liver disease Neg Hx     SOCIAL HISTORY: Social History   Tobacco Use  . Smoking status: Current Every Day Smoker    Packs/day: 0.50    Years: 25.00    Pack years: 12.50    Types: Cigarettes  . Smokeless tobacco: Never Used  Substance Use Topics  . Alcohol use: No    Comment: hx heavy etoh x 3 yrs, quit 30+ yrs ago    No Known Allergies  Current Outpatient Medications  Medication Sig Dispense Refill  . amLODipine (NORVASC) 10 MG tablet Take 1 tablet (10 mg total) by mouth daily. 90 tablet 0  . aspirin EC 81 MG tablet Take 81 mg by mouth daily.    . cholecalciferol (VITAMIN D3) 25 MCG (1000 UT) tablet Take 1,000 Units by mouth daily.    . cloNIDine (CATAPRES) 0.3 MG tablet Take 0.3 mg by mouth at bedtime.    . clopidogrel (PLAVIX) 75 MG tablet Take 1 tablet (75 mg total) by mouth daily. 30 tablet 3  . Ferrous Sulfate (IRON) 325 (65 Fe) MG TABS Take 325 mg by mouth daily.    Marland Kitchen FLUoxetine (PROZAC) 10 MG capsule Take 10 mg by mouth 2 (two) times a week.     . Insulin Glargine, 1  Unit Dial, 300 UNIT/ML SOPN Inject 50 Units into the skin at bedtime.    . Insulin Pen Needle (PEN NEEDLES) 32G X 4 MM MISC 1 each by Does not apply route at bedtime. 200 each 2  . lisinopril-hydrochlorothiazide (PRINZIDE,ZESTORETIC) 10-12.5 MG tablet Take 1 tablet by mouth daily.    . mirtazapine (REMERON) 15 MG tablet Take 7.5 mg by mouth daily.    . traZODone (DESYREL) 50 MG tablet Take 50 mg by mouth at bedtime.     No current facility-administered medications for this visit.     REVIEW OF SYSTEMS:  [X]  denotes positive finding, [ ]  denotes negative finding Cardiac  Comments:  Chest pain or chest pressure:    Shortness of breath upon exertion:    Short of breath when lying flat:    Irregular heart rhythm:        Vascular    Pain in calf, thigh, or hip brought on by ambulation:    Pain in feet at night that wakes you up from your sleep:     Blood clot in your veins:    Leg swelling:           PHYSICAL EXAM: Vitals:   10/11/18 1014  BP: 137/75  Pulse: 68  Resp: 16  Temp: 97.8 F (36.6 C)  TempSrc: Oral  Weight: 146 lb 4.8 oz (66.4 kg)  Height: 5\' 7"  (1.702 m)    GENERAL: The patient is a well-nourished male, in no acute distress. The vital signs are documented above. CARDIOVASCULAR: 2+ popliteal and dorsalis pedis pulses bilaterally.  Groins without pseudoaneurysm bilaterally PULMONARY: There is good air exchange  MUSCULOSKELETAL: There are no major deformities or cyanosis. NEUROLOGIC: No focal weakness or paresthesias are detected. SKIN: There are no ulcers or rashes noted. PSYCHIATRIC: The patient has a normal affect.  DATA:  Noninvasive studies showed improvement with ankle arm index from 0.8 bilaterally to 1.0 bilaterally following the procedure.  Duplex of his external iliac shows no evidence of stenosis  MEDICAL ISSUES:  Akiah Bauch result from external iliac artery angioplasty.  Will continue his walking program.  Will continue Plavix as well.  I again discussed  with the patient and his wife the critical limb's importance of smoking cessation.  He reports that he is cut back.  I explained that this is the most important thing he can do for his health.  He will see us again and 6 months with repeat noninvasive studies.  At that time we will consider discontinuation of his Plavix.    Chase Earthlyodd F. Alberto Schoch, MD FACS [facility] Tel [phone] Pager [phone]

## 2018-10-12 DIAGNOSIS — F17200 Nicotine dependence, unspecified, uncomplicated: Secondary | ICD-10-CM | POA: Diagnosis not present

## 2018-10-12 DIAGNOSIS — I10 Essential (primary) hypertension: Secondary | ICD-10-CM | POA: Diagnosis not present

## 2018-10-12 DIAGNOSIS — E119 Type 2 diabetes mellitus without complications: Secondary | ICD-10-CM | POA: Diagnosis not present

## 2018-10-12 DIAGNOSIS — I739 Peripheral vascular disease, unspecified: Secondary | ICD-10-CM | POA: Diagnosis not present

## 2018-10-31 ENCOUNTER — Other Ambulatory Visit: Payer: Self-pay | Admitting: "Endocrinology

## 2018-11-02 DIAGNOSIS — I10 Essential (primary) hypertension: Secondary | ICD-10-CM | POA: Diagnosis not present

## 2018-11-02 DIAGNOSIS — E119 Type 2 diabetes mellitus without complications: Secondary | ICD-10-CM | POA: Diagnosis not present

## 2018-11-02 DIAGNOSIS — Z139 Encounter for screening, unspecified: Secondary | ICD-10-CM | POA: Diagnosis not present

## 2018-11-02 DIAGNOSIS — I739 Peripheral vascular disease, unspecified: Secondary | ICD-10-CM | POA: Diagnosis not present

## 2018-11-03 DIAGNOSIS — E1142 Type 2 diabetes mellitus with diabetic polyneuropathy: Secondary | ICD-10-CM | POA: Diagnosis not present

## 2018-11-03 DIAGNOSIS — G5601 Carpal tunnel syndrome, right upper limb: Secondary | ICD-10-CM | POA: Diagnosis not present

## 2018-11-03 DIAGNOSIS — G5622 Lesion of ulnar nerve, left upper limb: Secondary | ICD-10-CM | POA: Diagnosis not present

## 2018-11-16 ENCOUNTER — Encounter: Payer: Self-pay | Admitting: Orthopedic Surgery

## 2018-11-16 ENCOUNTER — Other Ambulatory Visit: Payer: Self-pay

## 2018-11-16 ENCOUNTER — Telehealth: Payer: Self-pay | Admitting: Orthopedic Surgery

## 2018-11-16 ENCOUNTER — Ambulatory Visit (INDEPENDENT_AMBULATORY_CARE_PROVIDER_SITE_OTHER): Payer: Medicare Other | Admitting: Orthopedic Surgery

## 2018-11-16 VITALS — BP 165/82 | HR 54 | Ht 67.5 in | Wt 150.0 lb

## 2018-11-16 DIAGNOSIS — G5621 Lesion of ulnar nerve, right upper limb: Secondary | ICD-10-CM

## 2018-11-16 DIAGNOSIS — G5602 Carpal tunnel syndrome, left upper limb: Secondary | ICD-10-CM | POA: Diagnosis not present

## 2018-11-16 DIAGNOSIS — G5601 Carpal tunnel syndrome, right upper limb: Secondary | ICD-10-CM | POA: Diagnosis not present

## 2018-11-16 NOTE — Telephone Encounter (Signed)
Upon checking out, Chase Briggs stated that he was given 2 medicines by Dr. Gerilyn Pilgrim and that he was to call the office back with the name of those medications.    He did call back with the following medications:  PREDNISONE     10 MGS. GABAPENTIN      300 MGS. CAPSULE

## 2018-11-16 NOTE — Patient Instructions (Signed)
[facility] in [place] will call you with an appointment, they are located on [address] in [place], the phone number is [phone]

## 2018-11-16 NOTE — Progress Notes (Signed)
NEW PATIENT OFFICE VISIT  Chief Complaint  Patient presents with  . Carpal Tunnel    right     68 year old male status post bilateral femoral stent placement currently on Plavix presents with 28-month history of severe pain numbness and tingling right upper extremity with positive nerve study for cubital tunnel syndrome and carpal tunnel syndrome.  He also has a history of diabetes  He was treated initially with a brace which he does not tolerate and 2 additional medications that he cannot recall 1 of them he still taking the other when the prescription ran out and there was no refill.   Review of Systems  Constitutional: Negative for fever.  Skin: Negative.   Neurological: Positive for tingling and sensory change. Negative for focal weakness and weakness.     Past Medical History:  Diagnosis Date  . Diabetes mellitus   . Fatty liver   . GERD (gastroesophageal reflux disease)   . HTN (hypertension)   . Hyperlipidemia     Past Surgical History:  Procedure Laterality Date  . ABDOMINAL AORTOGRAM W/LOWER EXTREMITY N/A 08/22/2018   Procedure: ABDOMINAL AORTOGRAM W/LOWER EXTREMITY;  Surgeon: [name], MD;  Location: [facility] INVASIVE CV LAB;  Service: Cardiovascular;  Laterality: N/A;  . BACK SURGERY    . COLONOSCOPY  01/2007   Dr. [name], reviewed report, mild diverticulosis. Prep adequate. Next TCS due 01/2017  . ESOPHAGOGASTRODUODENOSCOPY N/A 10/21/2012   [id number] GASTRITIS  . MASS EXCISION Right 10/26/2012   Procedure: EXCISION MASS;  Surgeon: [name], MD;  Location: [facility];  Service: General;  Laterality: Right;  . PERIPHERAL VASCULAR INTERVENTION  08/22/2018   Procedure: PERIPHERAL VASCULAR INTERVENTION;  Surgeon: [name], MD;  Location: [facility] INVASIVE CV LAB;  Service: Cardiovascular;;  bilateral external iliac stents  . scalp mass  4-5 yrs ago   Dr [name]    Family History  Problem Relation Age of Onset  . Diabetes Mother   .  Diabetes Father   . Diabetes Brother   . Colon cancer Neg Hx   . Liver disease Neg Hx    Social History   Tobacco Use  . Smoking status: Current Every Day Smoker    Packs/day: 0.50    Years: 25.00    Pack years: 12.50    Types: Cigarettes  . Smokeless tobacco: Never Used  Substance Use Topics  . Alcohol use: No    Comment: hx heavy etoh x 3 yrs, quit 30+ yrs ago  . Drug use: No    No Known Allergies  Current Meds  Medication Sig  . amLODipine (NORVASC) 10 MG tablet Take 1 tablet (10 mg total) by mouth daily.  Marland Kitchen aspirin EC 81 MG tablet Take 81 mg by mouth daily.  . cholecalciferol (VITAMIN D3) 25 MCG (1000 UT) tablet Take 1,000 Units by mouth daily.  . cloNIDine (CATAPRES) 0.3 MG tablet Take 0.3 mg by mouth at bedtime.  . clopidogrel (PLAVIX) 75 MG tablet Take 1 tablet (75 mg total) by mouth daily.  . Ferrous Sulfate (IRON) 325 (65 Fe) MG TABS Take 325 mg by mouth daily.  . Insulin Glargine, 1 Unit Dial, 300 UNIT/ML SOPN Inject 50 Units into the skin at bedtime.  . Insulin Pen Needle (PEN NEEDLES) 32G X 4 MM MISC 1 each by Does not apply route at bedtime.  Marland Kitchen lisinopril-hydrochlorothiazide (PRINZIDE,ZESTORETIC) 10-12.5 MG tablet Take 1 tablet by mouth daily.  . mirtazapine (REMERON) 15 MG tablet Take 7.5 mg by mouth daily.  Marland Kitchen  traZODone (DESYREL) 50 MG tablet Take 50 mg by mouth at bedtime.    BP (!) 165/82   Pulse (!) 54   Ht 5' 7.5" (1.715 m)   Wt 150 lb (68 kg)   BMI 23.15 kg/m   Physical Exam Vitals signs reviewed.  Constitutional:      Appearance: Normal appearance. He is well-developed.  Skin:    General: Skin is warm and dry.     Findings: No erythema.  Neurological:     Mental Status: He is alert and oriented to person, place, and time.  Psychiatric:        Mood and Affect: Mood and affect normal.     Ortho Exam Right upper extremity: Skin clean dry intact no erythema rash ulceration or lesion Color capillary refill radial and ulnar pulses  normal Full flexion of all digits right small finger has a flexion contracture He has tenderness over the carpal tunnel positive carpal tunnel compression test negative Tinel's test positive Phalen's test Grip strength is equal right to left Joint is stable Tenderness over the ulnar nerve proximally at the elbow negative Tinel's there.  Left upper extremity Skin normal Pulse and perfusion color normal Grip strength equal to the right side Wrist joint stable  MEDICAL DECISION SECTION  Xrays were done at No   Dr. Gerilyn Pilgrim has sent over his notes for referral.  We have a med list for this patient.  We also have a report that his nerve study was positive for carpal tunnel and cubital tunnel as well as poly-sensory neuropathy.  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  . Right carpal tunnel syndrome Yes  . Cubital tunnel syndrome, right   . Left carpal tunnel syndrome     PLAN: (Rx., injectx, surgery, frx, mri/ct) Referral  He will call us back for review of his medications and I will make an adjustment to keep him comfortable until he sees the hand surgeon  No orders of the defined types were placed in this encounter.   Fuller Canada, MD  11/16/2018 10:04 AM

## 2018-11-23 ENCOUNTER — Other Ambulatory Visit: Payer: Self-pay | Admitting: Vascular Surgery

## 2018-11-23 DIAGNOSIS — G5621 Lesion of ulnar nerve, right upper limb: Secondary | ICD-10-CM | POA: Diagnosis not present

## 2018-11-23 DIAGNOSIS — G5603 Carpal tunnel syndrome, bilateral upper limbs: Secondary | ICD-10-CM | POA: Diagnosis not present

## 2018-11-25 ENCOUNTER — Encounter (INDEPENDENT_AMBULATORY_CARE_PROVIDER_SITE_OTHER): Payer: Self-pay | Admitting: Internal Medicine

## 2018-12-27 ENCOUNTER — Ambulatory Visit (INDEPENDENT_AMBULATORY_CARE_PROVIDER_SITE_OTHER): Payer: Medicare Other | Admitting: Internal Medicine

## 2018-12-27 DIAGNOSIS — Z139 Encounter for screening, unspecified: Secondary | ICD-10-CM | POA: Diagnosis not present

## 2018-12-27 DIAGNOSIS — E559 Vitamin D deficiency, unspecified: Secondary | ICD-10-CM | POA: Diagnosis not present

## 2018-12-27 DIAGNOSIS — E119 Type 2 diabetes mellitus without complications: Secondary | ICD-10-CM | POA: Diagnosis not present

## 2018-12-27 DIAGNOSIS — I10 Essential (primary) hypertension: Secondary | ICD-10-CM | POA: Diagnosis not present

## 2018-12-27 DIAGNOSIS — R630 Anorexia: Secondary | ICD-10-CM | POA: Diagnosis not present

## 2018-12-27 DIAGNOSIS — R5382 Chronic fatigue, unspecified: Secondary | ICD-10-CM | POA: Diagnosis not present

## 2018-12-27 LAB — VITAMIN D 25 HYDROXY (VIT D DEFICIENCY, FRACTURES): Vit D, 25-Hydroxy: 42

## 2018-12-27 LAB — BASIC METABOLIC PANEL
BUN: 16 (ref 4–21)
Creatinine: 1.6 — AB (ref 0.6–1.3)

## 2018-12-27 LAB — HEMOGLOBIN A1C: Hemoglobin A1C: 6.7

## 2018-12-30 DIAGNOSIS — E119 Type 2 diabetes mellitus without complications: Secondary | ICD-10-CM | POA: Diagnosis not present

## 2018-12-30 DIAGNOSIS — E559 Vitamin D deficiency, unspecified: Secondary | ICD-10-CM | POA: Diagnosis not present

## 2018-12-30 IMAGING — DX DG HIP (WITH OR WITHOUT PELVIS) 2-3V*R*
3 series · 3 of 3 positions shown · non-contrast
Comparison: None.

CLINICAL DATA: Right low back pain, right hip pain.

EXAM:
DG HIP (WITH OR WITHOUT PELVIS) 2-3V RIGHT

[pelvis ap]
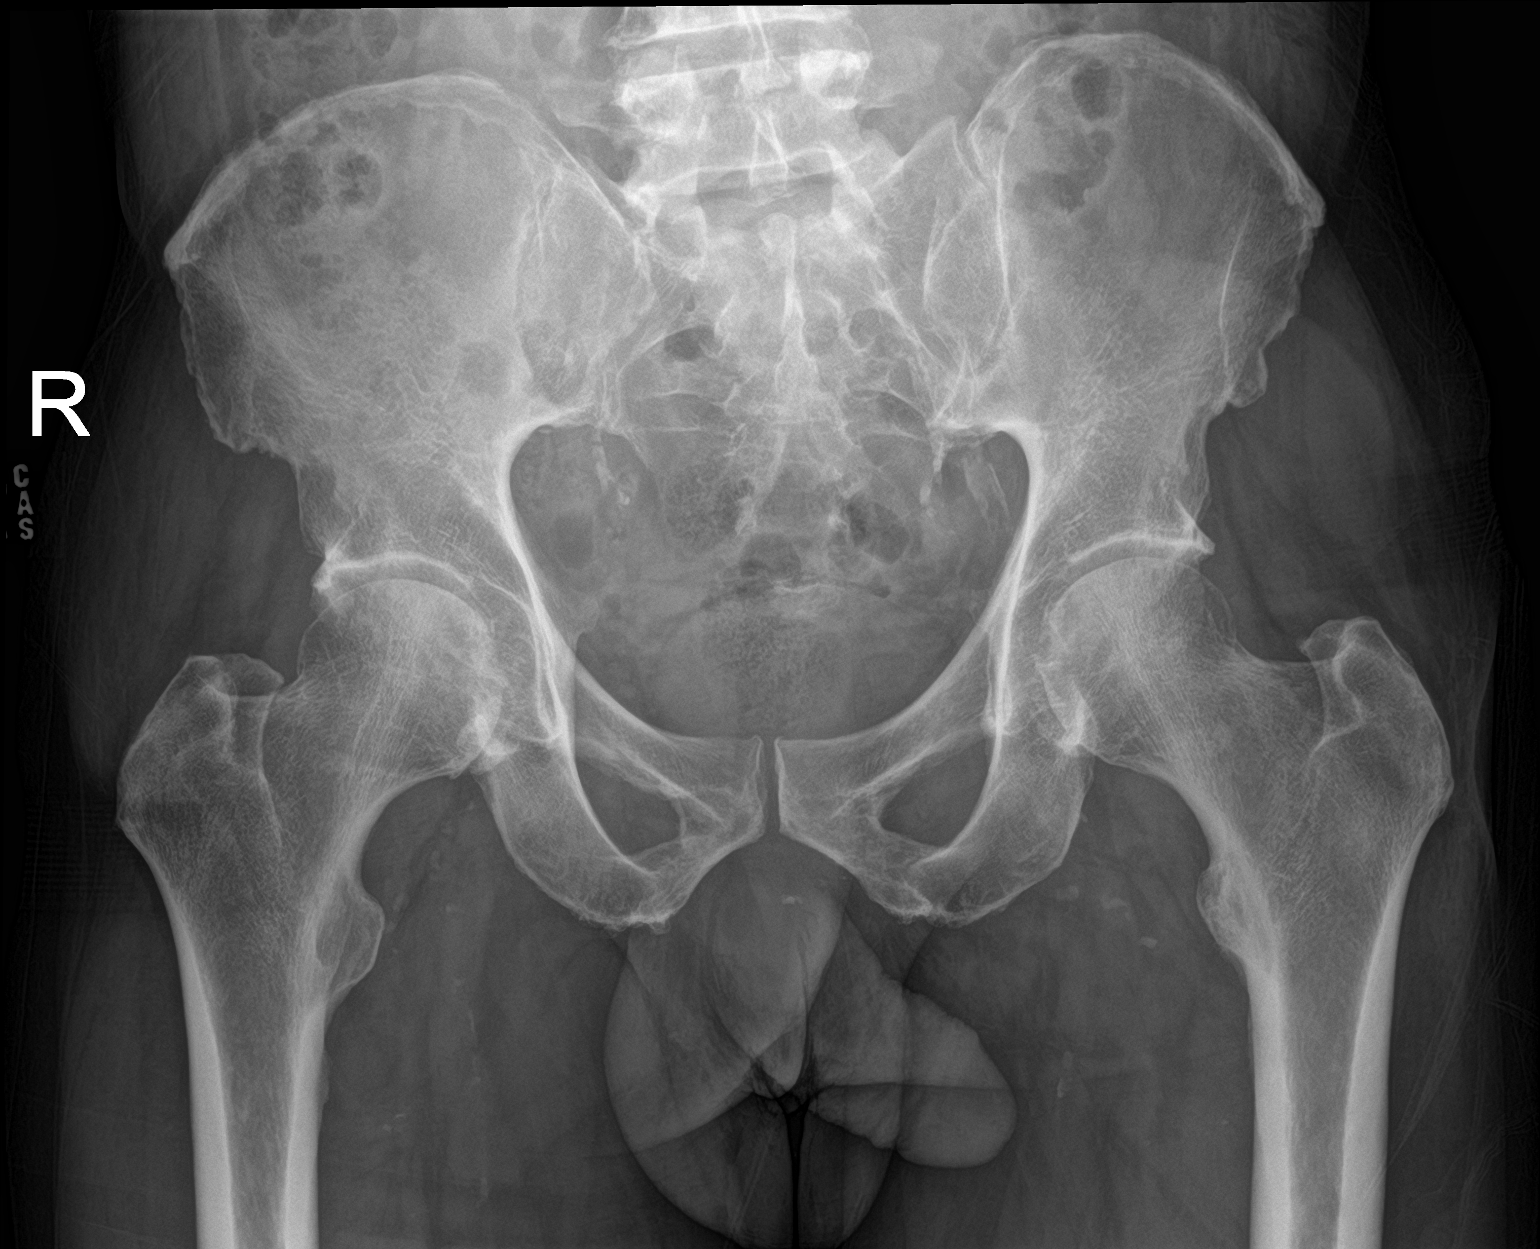

[hip ap]
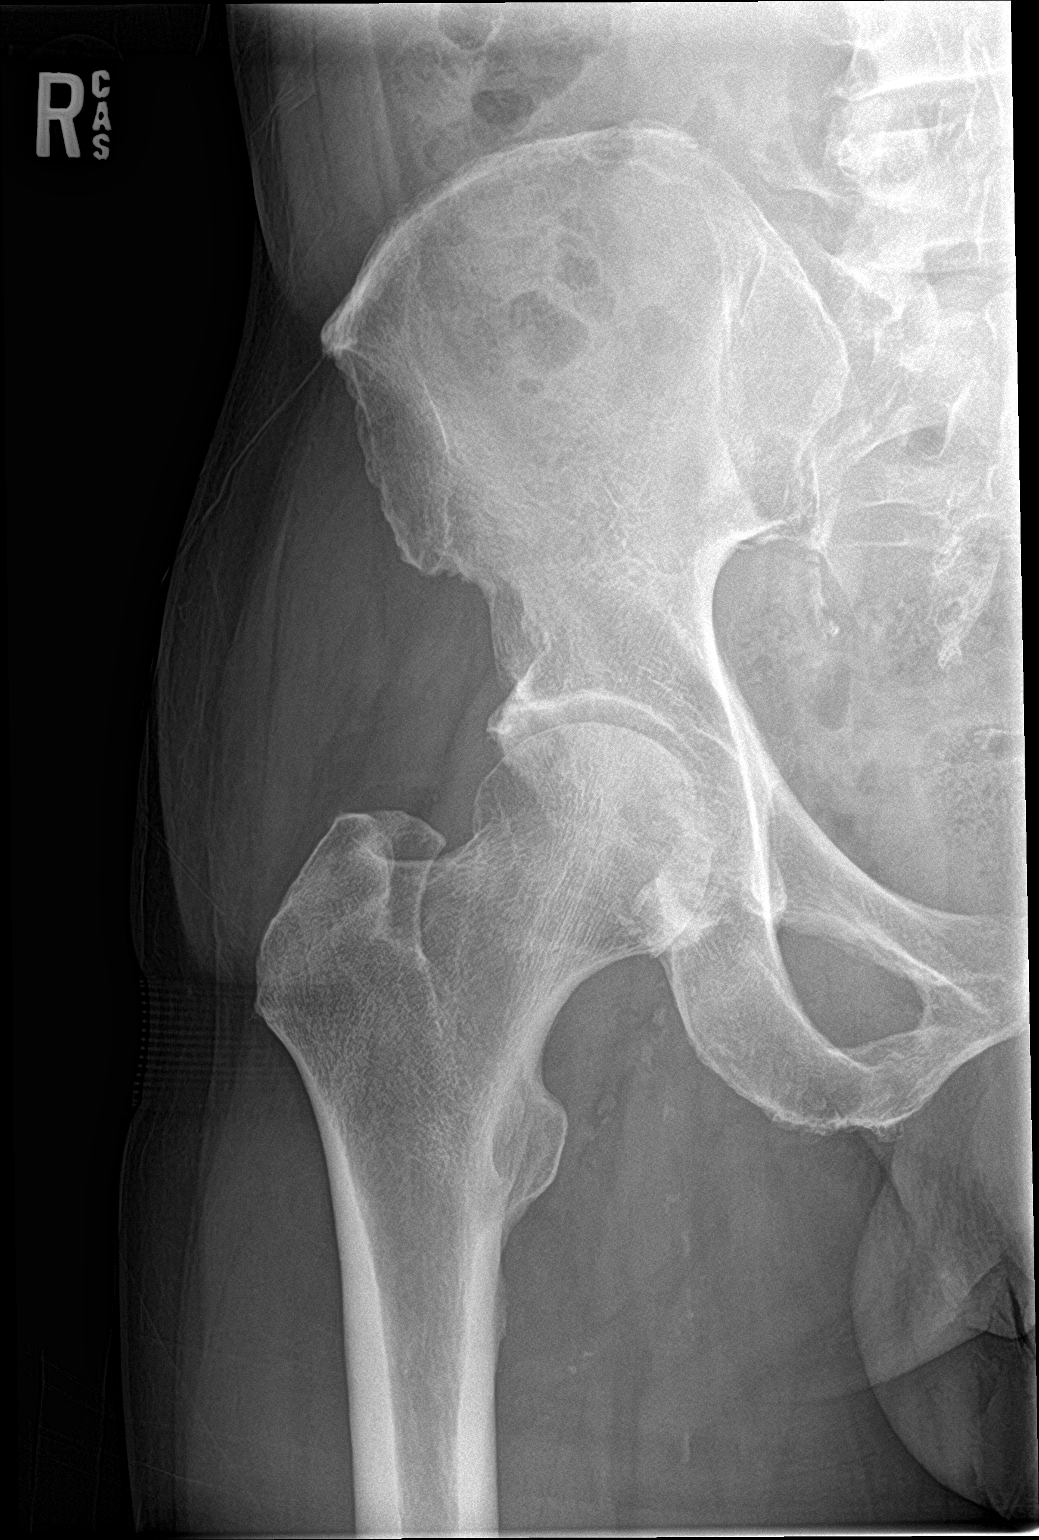

[hip lat]
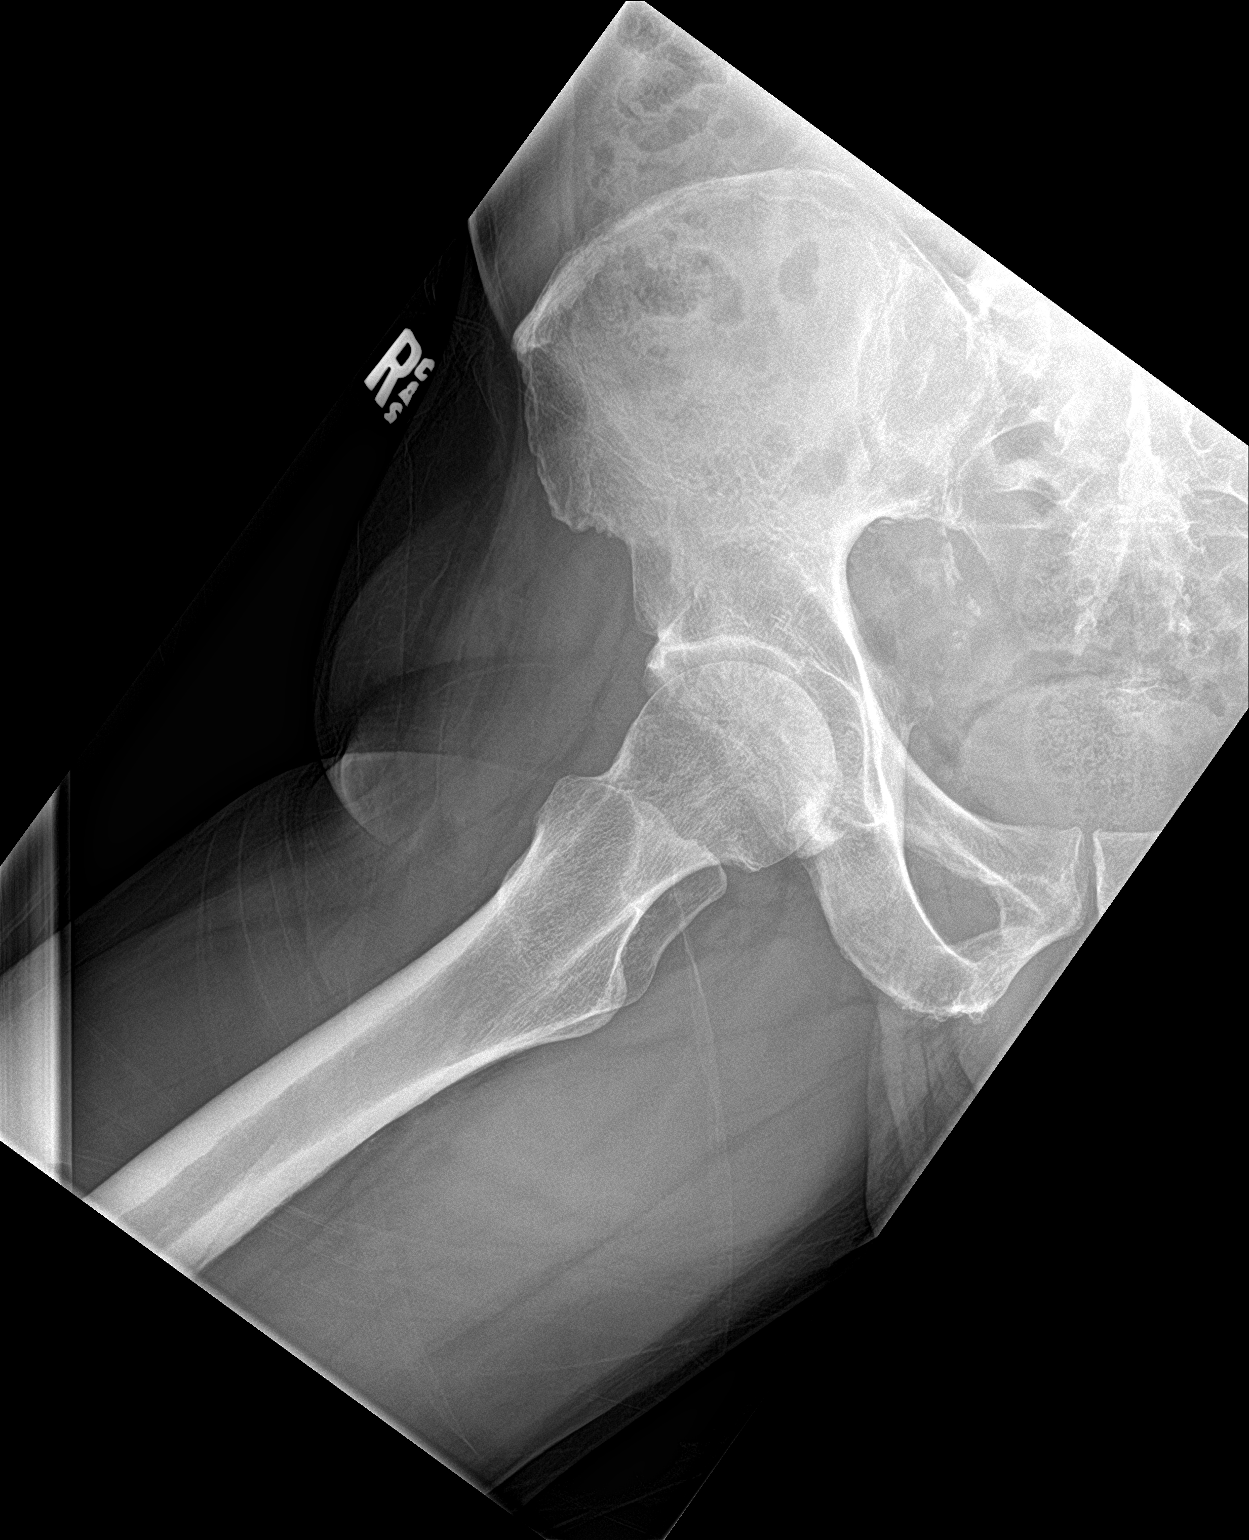

[3 of 3 positions shown; findings below may reference images not displayed]

FINDINGS: Mild symmetric degenerative changes in the hips with spurring. SI
joints are symmetric and unremarkable. No acute bony abnormality.
Specifically, no fracture, subluxation, or dislocation. Soft tissues
are intact.
IMPRESSION: Mild degenerative changes in the hips.  No acute bony abnormality.

## 2019-01-10 ENCOUNTER — Ambulatory Visit (INDEPENDENT_AMBULATORY_CARE_PROVIDER_SITE_OTHER): Payer: Medicare Other | Admitting: "Endocrinology

## 2019-01-10 ENCOUNTER — Encounter: Payer: Self-pay | Admitting: "Endocrinology

## 2019-01-10 ENCOUNTER — Other Ambulatory Visit: Payer: Self-pay

## 2019-01-10 VITALS — BP 148/73 | HR 75 | Ht 67.0 in

## 2019-01-10 DIAGNOSIS — N183 Chronic kidney disease, stage 3 (moderate): Secondary | ICD-10-CM

## 2019-01-10 DIAGNOSIS — E1165 Type 2 diabetes mellitus with hyperglycemia: Secondary | ICD-10-CM | POA: Diagnosis not present

## 2019-01-10 DIAGNOSIS — IMO0002 Reserved for concepts with insufficient information to code with codable children: Secondary | ICD-10-CM

## 2019-01-10 DIAGNOSIS — I10 Essential (primary) hypertension: Secondary | ICD-10-CM

## 2019-01-10 DIAGNOSIS — E1122 Type 2 diabetes mellitus with diabetic chronic kidney disease: Secondary | ICD-10-CM

## 2019-01-10 DIAGNOSIS — E782 Mixed hyperlipidemia: Secondary | ICD-10-CM | POA: Diagnosis not present

## 2019-01-10 DIAGNOSIS — F17200 Nicotine dependence, unspecified, uncomplicated: Secondary | ICD-10-CM | POA: Diagnosis not present

## 2019-01-10 NOTE — Progress Notes (Signed)
Endocrinology follow-up note  Subjective:    Patient ID: Chase Briggs, male    DOB: 02-Oct-1950, PCP [name], MD   Past Medical History:  Diagnosis Date  . Diabetes mellitus   . Fatty liver   . GERD (gastroesophageal reflux disease)   . HTN (hypertension)   . Hyperlipidemia    Past Surgical History:  Procedure Laterality Date  . ABDOMINAL AORTOGRAM W/LOWER EXTREMITY N/A 08/22/2018   Procedure: ABDOMINAL AORTOGRAM W/LOWER EXTREMITY;  Surgeon: [name], MD;  Location: [facility] INVASIVE CV LAB;  Service: Cardiovascular;  Laterality: N/A;  . BACK SURGERY    . COLONOSCOPY  01/2007   Dr. [name], reviewed report, mild diverticulosis. Prep adequate. Next TCS due 01/2017  . ESOPHAGOGASTRODUODENOSCOPY N/A 10/21/2012   [id number] GASTRITIS  . MASS EXCISION Right 10/26/2012   Procedure: EXCISION MASS;  Surgeon: [name], MD;  Location: [facility];  Service: General;  Laterality: Right;  . PERIPHERAL VASCULAR INTERVENTION  08/22/2018   Procedure: PERIPHERAL VASCULAR INTERVENTION;  Surgeon: [name], MD;  Location: [facility] INVASIVE CV LAB;  Service: Cardiovascular;;  bilateral external iliac stents  . scalp mass  4-5 yrs ago   Dr [name]   Social History   Socioeconomic History  . Marital status: Married    Spouse name: Not on file  . Number of children: 1  . Years of education: Not on file  . Highest education level: Not on file  Occupational History    Comment: [employer], heavy lifting  Social Needs  . Financial resource strain: Not on file  . Food insecurity:    Worry: Not on file    Inability: Not on file  . Transportation needs:    Medical: Not on file    Non-medical: Not on file  Tobacco Use  . Smoking status: Current Every Day Smoker    Packs/day: 0.50    Years: 25.00    Pack years: 12.50    Types: Cigarettes  . Smokeless tobacco: Never Used  Substance and Sexual Activity  . Alcohol use: No    Comment: hx heavy etoh x 3 yrs, quit  30+ yrs ago  . Drug use: No  . Sexual activity: Yes    Birth control/protection: None  Lifestyle  . Physical activity:    Days per week: Not on file    Minutes per session: Not on file  . Stress: Not on file  Relationships  . Social connections:    Talks on phone: Not on file    Gets together: Not on file    Attends religious service: Not on file    Active member of club or organization: Not on file    Attends meetings of clubs or organizations: Not on file    Relationship status: Not on file  Other Topics Concern  . Not on file  Social History Narrative  . Not on file   Outpatient Encounter Medications as of 01/10/2019  Medication Sig  . amLODipine (NORVASC) 10 MG tablet Take 1 tablet (10 mg total) by mouth daily.  Marland Kitchen aspirin EC 81 MG tablet Take 81 mg by mouth daily.  . cholecalciferol (VITAMIN D3) 25 MCG (1000 UT) tablet Take 1,000 Units by mouth daily.  . cloNIDine (CATAPRES) 0.3 MG tablet Take 0.3 mg by mouth at bedtime.  . clopidogrel (PLAVIX) 75 MG tablet TAKE 1 TABLET BY MOUTH DAILY  . Ferrous Sulfate (IRON) 325 (65 Fe) MG TABS Take 325 mg by mouth daily.  Marland Kitchen FLUoxetine (PROZAC)  10 MG capsule Take 10 mg by mouth 2 (two) times a week.   . gabapentin (NEURONTIN) 300 MG capsule   . Insulin Glargine, 1 Unit Dial, 300 UNIT/ML SOPN Inject 26 Units into the skin daily with breakfast.  . Insulin Pen Needle (PEN NEEDLES) 32G X 4 MM MISC 1 each by Does not apply route at bedtime.  Marland Kitchen lisinopril-hydrochlorothiazide (PRINZIDE,ZESTORETIC) 10-12.5 MG tablet Take 1 tablet by mouth daily.  . mirtazapine (REMERON) 15 MG tablet Take 7.5 mg by mouth daily.  . traZODone (DESYREL) 50 MG tablet Take 50 mg by mouth at bedtime.   No facility-administered encounter medications on file as of 01/10/2019.    ALLERGIES: No Known Allergies VACCINATION STATUS:  There is no immunization history on file for this patient.  Diabetes  He presents for his follow-up diabetic visit. He has type 2 diabetes  mellitus. Onset time: He was diagnosed at approximate age of 35 years. His disease course has been worsening. There are no hypoglycemic associated symptoms. Pertinent negatives for hypoglycemia include no confusion, headaches, pallor or seizures. There are no diabetic associated symptoms. Pertinent negatives for diabetes include no chest pain, no fatigue, no polydipsia, no polyphagia, no polyuria and no weakness. There are no hypoglycemic complications. Symptoms are worsening (He recently lost control of his glycemia due to exposure to heavy dose steroids.). Risk factors for coronary artery disease include diabetes mellitus, dyslipidemia, hypertension, male sex and sedentary lifestyle. He is compliant with treatment most of the time. His weight is fluctuating minimally. He is following a generally unhealthy diet. When asked about meal planning, he reported none. He has not had a previous visit with a dietitian. His home blood glucose trend is increasing steadily. His breakfast blood glucose range is generally 90-110 mg/dl. His lunch blood glucose range is generally 110-130 mg/dl. His dinner blood glucose range is generally 110-130 mg/dl. His bedtime blood glucose range is generally 110-130 mg/dl. His overall blood glucose range is 110-130 mg/dl. An ACE inhibitor/angiotensin II receptor blocker is being taken. Eye exam is current.  Hypertension  This is a chronic problem. The current episode started more than 1 year ago. The problem has been gradually improving since onset. The problem is uncontrolled. Pertinent negatives include no chest pain, headaches, neck pain, palpitations or shortness of breath. Risk factors for coronary artery disease include smoking/tobacco exposure, sedentary lifestyle, dyslipidemia, diabetes mellitus and family history. Past treatments include ACE inhibitors, calcium channel blockers and central alpha agonists. The current treatment provides no improvement. Compliance problems include  psychosocial issues.   Hyperlipidemia  This is a chronic problem. The current episode started more than 1 year ago. Exacerbating diseases include diabetes. Pertinent negatives include no chest pain, myalgias or shortness of breath. Current antihyperlipidemic treatment includes statins. Risk factors for coronary artery disease include dyslipidemia, diabetes mellitus, male sex, hypertension and a sedentary lifestyle.     Review of Systems  Constitutional: Negative for fatigue and unexpected weight change.  HENT: Negative for dental problem, mouth sores and trouble swallowing.   Eyes: Negative for visual disturbance.  Respiratory: Negative for cough, choking, chest tightness, shortness of breath and wheezing.   Cardiovascular: Negative for chest pain, palpitations and leg swelling.  Gastrointestinal: Negative for abdominal distention, abdominal pain, constipation, diarrhea, nausea and vomiting.  Endocrine: Negative for polydipsia, polyphagia and polyuria.  Genitourinary: Negative for dysuria, flank pain, hematuria and urgency.  Musculoskeletal: Negative for back pain, gait problem, myalgias and neck pain.  Skin: Negative for pallor, rash and wound.  Neurological: Negative for seizures, syncope, weakness, numbness and headaches.  Psychiatric/Behavioral: Negative.  Negative for confusion and dysphoric mood.    Objective:    BP (!) 148/73   Pulse 75   Ht 5\' 7"  (1.702 m)   BMI 23.49 kg/m   Wt Readings from Last 3 Encounters:  11/16/18 150 lb (68 kg)  10/11/18 146 lb 4.8 oz (66.4 kg)  10/05/18 148 lb (67.1 kg)    Physical Exam  Constitutional: He is oriented to person, place, and time. He appears well-developed. He is cooperative. No distress.  HENT:  Head: Normocephalic and atraumatic.  Eyes: EOM are normal.  Neck: Normal range of motion. Neck supple. No tracheal deviation present. No thyromegaly present.  Cardiovascular: Normal rate, S1 normal and S2 normal. Exam reveals no gallop.   No murmur heard. Pulses:      Dorsalis pedis pulses are 1+ on the right side and 1+ on the left side.       Posterior tibial pulses are 1+ on the right side and 1+ on the left side.  Pulmonary/Chest: Effort normal. No respiratory distress. He has no wheezes.  Abdominal: Bowel sounds are normal. He exhibits no distension. There is no abdominal tenderness. There is no guarding and no CVA tenderness.  Musculoskeletal:        General: No edema.     Right shoulder: He exhibits no swelling and no deformity.  Neurological: He is alert and oriented to person, place, and time. He has normal strength and normal reflexes. No cranial nerve deficit or sensory deficit. Gait normal.  Skin: Skin is warm and dry. No rash noted. No cyanosis. Nails show no clubbing.  Psychiatric: He has a normal mood and affect. His speech is normal. Judgment normal. Cognition and memory are normal.    Complete Blood Count (Most recent): Lab Results  Component Value Date   WBC 7.2 09/16/2018   HGB 12.8 (L) 09/16/2018   HCT 39.5 09/16/2018   MCV 94.5 09/16/2018   PLT 202 09/16/2018   Chemistry (most recent): Lab Results  Component Value Date   NA 132 (L) 09/28/2018   K 4.4 09/28/2018   CL 99 09/28/2018   CO2 25 09/28/2018   BUN 16 12/27/2018   CREATININE 1.6 (A) 12/27/2018   Diabetic Labs (most recent): Lab Results  Component Value Date   HGBA1C 6.7 12/27/2018   HGBA1C 10.8 (H) 09/28/2018   HGBA1C 6.7 (H) 02/23/2018   Lipid Panel     Component Value Date/Time   CHOL 104 08/20/2017 0725   TRIG 84 08/20/2017 0725   HDL 43 08/20/2017 0725   CHOLHDL 2.4 08/20/2017 0725   LDLCALC 45 08/20/2017 0725     Assessment & Plan:   1. Type 2 diabetes mellitus without complication, without long-term current use of insulin (HCC)  -He presents with better control of glycemia and A1c of 6.7%, improving from 10.8%.    He is a chronic heavy smoker, remains at a high risk for more acute and chronic complications of  diabetes which include CAD, CVA, CKD, retinopathy, and neuropathy. These are all discussed in detail with the patient.    Recent labs reviewed, stage 3 renal insufficiency. - I have re-counseled the patient on diet management  by adopting a carbohydrate restricted / protein rich  Diet.  - Patient admits there is a room for improvement in his diet and drink choices. -  Suggestion is made for him to avoid simple carbohydrates  from his diet including Cakes,  Sweet Desserts / Pastries, Ice Cream, Soda (diet and regular), Sweet Tea, Candies, Chips, Cookies, Store Bought Juices, Alcohol in Excess of  1-2 drinks a day, Artificial Sweeteners, and "Sugar-free" Products. This will help patient to have stable blood glucose profile and potentially avoid unintended weight gain.   - Patient is advised to stick to a routine mealtimes to eat 3 meals  a day and avoid unnecessary snacks ( to snack only to correct hypoglycemia).  - I have approached patient with the following individualized plan to manage diabetes and patient agrees.  - He has benefited from insulin initiation.   - Based on  his presentation with tightly controlled glycemic profile both fasting and postprandial, he will not require prandial insulin for now.  He is advised to lower his Toujeo to 26 units daily at breakfast associated with monitoring of blood glucose 2 times a day-daily before breakfast and at bedtime.   - He will call clinic if he registers blood glucose readings less than 70 or greater than 200 mg/dL.  -   He has stage 3 renal insufficiency , will stay off of metformin for now. If his renal insufficiency continues, he will need consultation with nephrology.    - Patient is not a candidate for  incretin therapy . - Patient specific target  for A1c; LDL, HDL, Triglycerides, and  Waist Circumference were discussed in detail.  2) BP/HTN: His blood pressure is not controlled to target.    He has 3 medications including losartan,  clonidine, and amlodipine.  He continues to smoke heavily.  He is advised to be consistent taking his medications. - re-counseled him about smoking cessation.   3) Lipids/HPL: Recent lipid panel showed LDL of 45.  He is not on statins at the moment.   4)  Weight/Diet: He has regained 20+ lbs  overall and this is a good development for him.  - He would benefit from addition of more complex carbohydrates, information provided. CDE consult is requested.  5. Vitamin D deficiency - His insurance would not allow for 25 hydroxy vitamin D recheck. - He will continue with vitamin D3 5000 units daily for 90 days.   6) Chronic Care/Health Maintenance:  -Patient is on ACEI/ARB and Statin medications and encouraged to continue to follow up with Ophthalmology, Podiatrist at least yearly or according to recommendations, and advised to  Quit  smoking. I have recommended yearly flu vaccine and pneumonia vaccination at least every 5 years; moderate intensity exercise for up to 150 minutes weekly; and  sleep for at least 7 hours a day. -He is counseled extensively for smoking cessation.  - I advised patient to maintain close follow up with [name], MD for primary care needs. - I have advised him to avoid over-the-counter pain medications to avoid additional risk for renal insufficiency.  - Time spent with the patient: 25 min, of which >50% was spent in reviewing his blood glucose logs , discussing his hypoglycemia and hyperglycemia episodes, reviewing his current and  previous labs / studies and medications  doses and developing a plan to avoid hypoglycemia and hyperglycemia. Please refer to Patient Instructions for Blood Glucose Monitoring and Insulin/Medications Dosing Guide"  in media tab for additional information. Please  also refer to " Patient Self Inventory" in the Media  tab for reviewed elements of pertinent patient history.  Chase Briggs participated in the discussions, expressed  understanding, and voiced agreement with the above plans.  All questions were answered to  his satisfaction. he is encouraged to contact clinic should he have any questions or concerns prior to his return visit.   Follow up plan: -Return in about 4 months (around 05/12/2019) for Follow up with Pre-visit Labs, Meter, and Logs.  Chase LunchGebre Nida, MD Phone: [phone]  Fax: [phone]  This note was partially dictated with voice recognition software. Similar sounding words can be transcribed inadequately or may not  be corrected upon review.  01/10/2019, 11:58 AM

## 2019-01-12 ENCOUNTER — Ambulatory Visit: Payer: Medicare Other | Admitting: "Endocrinology

## 2019-01-30 DIAGNOSIS — E1142 Type 2 diabetes mellitus with diabetic polyneuropathy: Secondary | ICD-10-CM | POA: Diagnosis not present

## 2019-01-30 DIAGNOSIS — G5622 Lesion of ulnar nerve, left upper limb: Secondary | ICD-10-CM | POA: Diagnosis not present

## 2019-01-30 DIAGNOSIS — G5601 Carpal tunnel syndrome, right upper limb: Secondary | ICD-10-CM | POA: Diagnosis not present

## 2019-02-17 ENCOUNTER — Other Ambulatory Visit: Payer: Self-pay | Admitting: Orthopedic Surgery

## 2019-03-08 ENCOUNTER — Telehealth: Payer: Self-pay

## 2019-03-08 NOTE — Telephone Encounter (Signed)
He can switch his toujeo 26 units to bedtime. Continue to monitor glucose twice a day -before breakfast and bed time.

## 2019-03-08 NOTE — Telephone Encounter (Signed)
Patient is aware of the recommendations 

## 2019-03-08 NOTE — Telephone Encounter (Signed)
Chase Briggs is stating that his blood sugar readings are running high  Sun 06/21 Am-267 Lunch-250 Bedtime-240  Mon 06/22 Am-264 Lunch-286 Bedtime-252  Tues 06/23 Am-264 Bedtime-252 *ate dinner at 5:30 pm and nothing afterwards  Wed 06/24 Am-141  *He states that he thinks his blood sugars were better when he was taking his insulin at bedtime, please advise?

## 2019-03-13 ENCOUNTER — Encounter (HOSPITAL_BASED_OUTPATIENT_CLINIC_OR_DEPARTMENT_OTHER): Payer: Self-pay

## 2019-03-13 ENCOUNTER — Other Ambulatory Visit: Payer: Self-pay

## 2019-03-16 ENCOUNTER — Other Ambulatory Visit: Payer: Self-pay

## 2019-03-16 ENCOUNTER — Encounter (HOSPITAL_BASED_OUTPATIENT_CLINIC_OR_DEPARTMENT_OTHER)
Admission: RE | Admit: 2019-03-16 | Discharge: 2019-03-16 | Disposition: A | Discharge status: Home / Self Care | Payer: Medicare Other | Source: Ambulatory Visit | Attending: Orthopedic Surgery | Admitting: Orthopedic Surgery

## 2019-03-16 DIAGNOSIS — Z1159 Encounter for screening for other viral diseases: Secondary | ICD-10-CM | POA: Diagnosis not present

## 2019-03-16 DIAGNOSIS — Z01812 Encounter for preprocedural laboratory examination: Secondary | ICD-10-CM | POA: Insufficient documentation

## 2019-03-16 LAB — BASIC METABOLIC PANEL
Anion gap: 9 (ref 5–15)
BUN: 15 mg/dL (ref 8–23)
CO2: 26 mmol/L (ref 22–32)
Calcium: 9.6 mg/dL (ref 8.9–10.3)
Chloride: 104 mmol/L (ref 98–111)
Creatinine, Ser: 1.6 mg/dL — ABNORMAL HIGH (ref 0.61–1.24)
GFR calc Af Amer: 51 mL/min — ABNORMAL LOW (ref >60)
GFR calc non Af Amer: 44 mL/min — ABNORMAL LOW (ref >60)
Glucose, Bld: 65 mg/dL — ABNORMAL LOW (ref 70–99)
Potassium: 4.2 mmol/L (ref 3.5–5.1)
Sodium: 139 mmol/L (ref 135–145)

## 2019-03-16 NOTE — Progress Notes (Signed)
Gatorade 2 drink given with instructions to complete by 0430 dos, pt verbalized understanding. 

## 2019-03-17 ENCOUNTER — Other Ambulatory Visit (HOSPITAL_COMMUNITY)
Admission: RE | Admit: 2019-03-17 | Discharge: 2019-03-17 | Disposition: A | Discharge status: Home / Self Care | Payer: Medicare Other | Source: Ambulatory Visit | Attending: Orthopedic Surgery | Admitting: Orthopedic Surgery

## 2019-03-17 DIAGNOSIS — Z01812 Encounter for preprocedural laboratory examination: Secondary | ICD-10-CM | POA: Diagnosis not present

## 2019-03-17 DIAGNOSIS — Z1159 Encounter for screening for other viral diseases: Secondary | ICD-10-CM | POA: Diagnosis not present

## 2019-03-17 LAB — SARS CORONAVIRUS 2 (TAT 6-24 HRS): SARS Coronavirus 2: NEGATIVE

## 2019-03-21 ENCOUNTER — Encounter (HOSPITAL_BASED_OUTPATIENT_CLINIC_OR_DEPARTMENT_OTHER)
Admission: RE | Disposition: A | Discharge status: Home / Self Care | Payer: Self-pay | Source: Home / Self Care | Attending: Orthopedic Surgery

## 2019-03-21 ENCOUNTER — Ambulatory Visit (HOSPITAL_BASED_OUTPATIENT_CLINIC_OR_DEPARTMENT_OTHER): Payer: Medicare Other | Admitting: Anesthesiology

## 2019-03-21 ENCOUNTER — Encounter (HOSPITAL_BASED_OUTPATIENT_CLINIC_OR_DEPARTMENT_OTHER): Payer: Self-pay | Admitting: *Deleted

## 2019-03-21 ENCOUNTER — Ambulatory Visit (HOSPITAL_BASED_OUTPATIENT_CLINIC_OR_DEPARTMENT_OTHER)
Admission: RE | Admit: 2019-03-21 | Discharge: 2019-03-21 | Disposition: A | Discharge status: Home / Self Care | Payer: Medicare Other | Attending: Orthopedic Surgery | Admitting: Orthopedic Surgery

## 2019-03-21 ENCOUNTER — Other Ambulatory Visit: Payer: Self-pay

## 2019-03-21 DIAGNOSIS — R200 Anesthesia of skin: Secondary | ICD-10-CM | POA: Diagnosis present

## 2019-03-21 DIAGNOSIS — Z794 Long term (current) use of insulin: Secondary | ICD-10-CM | POA: Insufficient documentation

## 2019-03-21 DIAGNOSIS — G8918 Other acute postprocedural pain: Secondary | ICD-10-CM | POA: Diagnosis not present

## 2019-03-21 DIAGNOSIS — E1151 Type 2 diabetes mellitus with diabetic peripheral angiopathy without gangrene: Secondary | ICD-10-CM | POA: Insufficient documentation

## 2019-03-21 DIAGNOSIS — G5601 Carpal tunnel syndrome, right upper limb: Secondary | ICD-10-CM | POA: Diagnosis not present

## 2019-03-21 DIAGNOSIS — E1142 Type 2 diabetes mellitus with diabetic polyneuropathy: Secondary | ICD-10-CM | POA: Insufficient documentation

## 2019-03-21 DIAGNOSIS — G5621 Lesion of ulnar nerve, right upper limb: Secondary | ICD-10-CM | POA: Diagnosis not present

## 2019-03-21 DIAGNOSIS — I10 Essential (primary) hypertension: Secondary | ICD-10-CM | POA: Diagnosis not present

## 2019-03-21 DIAGNOSIS — F17210 Nicotine dependence, cigarettes, uncomplicated: Secondary | ICD-10-CM | POA: Insufficient documentation

## 2019-03-21 DIAGNOSIS — G5603 Carpal tunnel syndrome, bilateral upper limbs: Secondary | ICD-10-CM | POA: Insufficient documentation

## 2019-03-21 DIAGNOSIS — E782 Mixed hyperlipidemia: Secondary | ICD-10-CM | POA: Diagnosis not present

## 2019-03-21 HISTORY — PX: ULNAR NERVE TRANSPOSITION: SHX2595

## 2019-03-21 HISTORY — DX: Insomnia, unspecified: G47.00

## 2019-03-21 HISTORY — PX: CARPAL TUNNEL WITH CUBITAL TUNNEL: SHX5608

## 2019-03-21 LAB — GLUCOSE, CAPILLARY
Glucose-Capillary: 85 mg/dL (ref 70–99)
Glucose-Capillary: 110 mg/dL — ABNORMAL HIGH (ref 70–99)

## 2019-03-21 SURGERY — RELEASE, CARPAL TUNNEL AND CUBITAL TUNNEL
Anesthesia: General | Laterality: Right

## 2019-03-21 MED ORDER — PROPOFOL 10 MG/ML IV BOLUS
INTRAVENOUS | Status: AC
  Filled 2019-03-21: qty 20

## 2019-03-21 MED ORDER — ROPIVACAINE HCL 7.5 MG/ML IJ SOLN
INTRAMUSCULAR | Status: DC | PRN
  Administered 2019-03-21: 20 mL via PERINEURAL

## 2019-03-21 MED ORDER — DEXMEDETOMIDINE HCL IN NACL 200 MCG/50ML IV SOLN
INTRAVENOUS | Status: AC
  Filled 2019-03-21: qty 100

## 2019-03-21 MED ORDER — MIDAZOLAM HCL 2 MG/2ML IJ SOLN
INTRAMUSCULAR | Status: AC
  Filled 2019-03-21: qty 2

## 2019-03-21 MED ORDER — EPHEDRINE 5 MG/ML INJ
INTRAVENOUS | Status: AC
  Filled 2019-03-21: qty 10

## 2019-03-21 MED ORDER — CHLORHEXIDINE GLUCONATE 4 % EX LIQD: 60.0000 mL | Freq: Once | CUTANEOUS | Status: DC

## 2019-03-21 MED ORDER — CLONIDINE HCL (ANALGESIA) 100 MCG/ML EP SOLN
EPIDURAL | Status: DC | PRN
  Administered 2019-03-21: 50 ug

## 2019-03-21 MED ORDER — LIDOCAINE 2% (20 MG/ML) 5 ML SYRINGE
INTRAMUSCULAR | Status: DC | PRN
  Administered 2019-03-21: 60 mg via INTRAVENOUS

## 2019-03-21 MED ORDER — ONDANSETRON HCL 4 MG/2ML IJ SOLN
INTRAMUSCULAR | Status: AC
  Filled 2019-03-21: qty 2

## 2019-03-21 MED ORDER — MIDAZOLAM HCL 2 MG/2ML IJ SOLN
1.0000 mg | INTRAMUSCULAR | Status: DC | PRN
  Administered 2019-03-21: 2 mg via INTRAVENOUS

## 2019-03-21 MED ORDER — LACTATED RINGERS IV SOLN
INTRAVENOUS | Status: DC
  Administered 2019-03-21: 07:00:00 via INTRAVENOUS

## 2019-03-21 MED ORDER — FENTANYL CITRATE (PF) 100 MCG/2ML IJ SOLN: 25.0000 ug | INTRAMUSCULAR | Status: DC | PRN

## 2019-03-21 MED ORDER — CEFAZOLIN SODIUM-DEXTROSE 2-4 GM/100ML-% IV SOLN
INTRAVENOUS | Status: AC
  Filled 2019-03-21: qty 100

## 2019-03-21 MED ORDER — ACETAMINOPHEN 500 MG PO TABS
1000.0000 mg | ORAL_TABLET | Freq: Once | ORAL | Status: AC
  Administered 2019-03-21: 1000 mg via ORAL

## 2019-03-21 MED ORDER — FENTANYL CITRATE (PF) 100 MCG/2ML IJ SOLN
50.0000 ug | INTRAMUSCULAR | Status: DC | PRN
  Administered 2019-03-21: 50 ug via INTRAVENOUS

## 2019-03-21 MED ORDER — FENTANYL CITRATE (PF) 100 MCG/2ML IJ SOLN
INTRAMUSCULAR | Status: AC
  Filled 2019-03-21: qty 2

## 2019-03-21 MED ORDER — BUPIVACAINE HCL (PF) 0.25 % IJ SOLN
INTRAMUSCULAR | Status: AC
  Filled 2019-03-21: qty 90

## 2019-03-21 MED ORDER — HYDROCODONE-ACETAMINOPHEN 5-325 MG PO TABS: 1.0000 | ORAL_TABLET | Freq: Four times a day (QID) | ORAL | 0 refills | Status: DC | PRN

## 2019-03-21 MED ORDER — ONDANSETRON HCL 4 MG/2ML IJ SOLN: 4.0000 mg | Freq: Once | INTRAMUSCULAR | Status: DC | PRN

## 2019-03-21 MED ORDER — ONDANSETRON HCL 4 MG/2ML IJ SOLN
INTRAMUSCULAR | Status: DC | PRN
  Administered 2019-03-21: 4 mg via INTRAVENOUS

## 2019-03-21 MED ORDER — EPHEDRINE SULFATE-NACL 50-0.9 MG/10ML-% IV SOSY
PREFILLED_SYRINGE | INTRAVENOUS | Status: DC | PRN
  Administered 2019-03-21: 10 mg via INTRAVENOUS

## 2019-03-21 MED ORDER — PHENYLEPHRINE 40 MCG/ML (10ML) SYRINGE FOR IV PUSH (FOR BLOOD PRESSURE SUPPORT)
PREFILLED_SYRINGE | INTRAVENOUS | Status: AC
  Filled 2019-03-21: qty 10

## 2019-03-21 MED ORDER — PROPOFOL 10 MG/ML IV BOLUS
INTRAVENOUS | Status: DC | PRN
  Administered 2019-03-21: 120 mg via INTRAVENOUS

## 2019-03-21 MED ORDER — PHENYLEPHRINE 40 MCG/ML (10ML) SYRINGE FOR IV PUSH (FOR BLOOD PRESSURE SUPPORT)
PREFILLED_SYRINGE | INTRAVENOUS | Status: DC | PRN
  Administered 2019-03-21 (×5): 40 ug via INTRAVENOUS

## 2019-03-21 MED ORDER — GLYCOPYRROLATE 0.2 MG/ML IJ SOLN
INTRAMUSCULAR | Status: DC | PRN
  Administered 2019-03-21: 0.1 mg via INTRAVENOUS

## 2019-03-21 MED ORDER — GLYCOPYRROLATE PF 0.2 MG/ML IJ SOSY
PREFILLED_SYRINGE | INTRAMUSCULAR | Status: AC
  Filled 2019-03-21: qty 1

## 2019-03-21 MED ORDER — PROPOFOL 500 MG/50ML IV EMUL
INTRAVENOUS | Status: AC
  Filled 2019-03-21: qty 50

## 2019-03-21 MED ORDER — CEFAZOLIN SODIUM 1 G IJ SOLR
INTRAMUSCULAR | Status: AC
  Filled 2019-03-21: qty 40

## 2019-03-21 MED ORDER — CEFAZOLIN SODIUM-DEXTROSE 2-4 GM/100ML-% IV SOLN
2.0000 g | INTRAVENOUS | Status: AC
  Administered 2019-03-21: 09:00:00 2 g via INTRAVENOUS

## 2019-03-21 MED ORDER — ACETAMINOPHEN 500 MG PO TABS
ORAL_TABLET | ORAL | Status: AC
  Filled 2019-03-21: qty 2

## 2019-03-21 SURGICAL SUPPLY — 50 items
APL PRP STRL LF DISP 70% ISPRP (MISCELLANEOUS) ×1
BLADE MINI RND TIP GREEN BEAV (BLADE) ×2 IMPLANT
BLADE SURG 15 STRL LF DISP TIS (BLADE) ×1 IMPLANT
BLADE SURG 15 STRL SS (BLADE) ×3
BNDG CMPR 9X4 STRL LF SNTH (GAUZE/BANDAGES/DRESSINGS) ×1
BNDG COHESIVE 3X5 TAN STRL LF (GAUZE/BANDAGES/DRESSINGS) ×6 IMPLANT
BNDG ESMARK 4X9 LF (GAUZE/BANDAGES/DRESSINGS) ×3 IMPLANT
BNDG GAUZE ELAST 4 BULKY (GAUZE/BANDAGES/DRESSINGS) ×3 IMPLANT
CHLORAPREP W/TINT 26 (MISCELLANEOUS) ×3 IMPLANT
CORD BIPOLAR FORCEPS 12FT (ELECTRODE) ×3 IMPLANT
COVER BACK TABLE REUSABLE LG (DRAPES) ×3 IMPLANT
COVER MAYO STAND REUSABLE (DRAPES) ×3 IMPLANT
COVER WAND RF STERILE (DRAPES) IMPLANT
CUFF TOURN SGL QUICK 18X3 (MISCELLANEOUS) ×1 IMPLANT
CUFF TOURN SGL QUICK 18X4 (TOURNIQUET CUFF) ×3 IMPLANT
DECANTER SPIKE VIAL GLASS SM (MISCELLANEOUS) IMPLANT
DRAPE EXTREMITY T 121X128X90 (DISPOSABLE) ×3 IMPLANT
DRAPE SURG 17X23 STRL (DRAPES) ×3 IMPLANT
DRSG PAD ABDOMINAL 8X10 ST (GAUZE/BANDAGES/DRESSINGS) ×3 IMPLANT
GAUZE 4X4 16PLY RFD (DISPOSABLE) IMPLANT
GAUZE SPONGE 4X4 12PLY STRL (GAUZE/BANDAGES/DRESSINGS) ×3 IMPLANT
GAUZE XEROFORM 1X8 LF (GAUZE/BANDAGES/DRESSINGS) ×3 IMPLANT
GLOVE BIOGEL PI IND STRL 8.5 (GLOVE) ×1 IMPLANT
GLOVE BIOGEL PI INDICATOR 8.5 (GLOVE) ×2
GLOVE SURG ORTHO 8.0 STRL STRW (GLOVE) ×3 IMPLANT
GOWN STRL REUS W/ TWL LRG LVL3 (GOWN DISPOSABLE) ×1 IMPLANT
GOWN STRL REUS W/TWL LRG LVL3 (GOWN DISPOSABLE) ×3
GOWN STRL REUS W/TWL XL LVL3 (GOWN DISPOSABLE) ×3 IMPLANT
LOOP VESSEL MAXI BLUE (MISCELLANEOUS) ×2 IMPLANT
NDL PRECISIONGLIDE 27X1.5 (NEEDLE) IMPLANT
NEEDLE PRECISIONGLIDE 27X1.5 (NEEDLE) IMPLANT
NS IRRIG 1000ML POUR BTL (IV SOLUTION) ×3 IMPLANT
PACK BASIN DAY SURGERY FS (CUSTOM PROCEDURE TRAY) ×3 IMPLANT
PAD CAST 3X4 CTTN HI CHSV (CAST SUPPLIES) IMPLANT
PAD CAST 4YDX4 CTTN HI CHSV (CAST SUPPLIES) ×1 IMPLANT
PADDING CAST COTTON 3X4 STRL (CAST SUPPLIES) ×3
PADDING CAST COTTON 4X4 STRL (CAST SUPPLIES) ×3
SLEEVE SCD COMPRESS KNEE MED (MISCELLANEOUS) ×3 IMPLANT
SLING ARM FOAM STRAP LRG (SOFTGOODS) ×2 IMPLANT
SPLINT PLASTER CAST XFAST 3X15 (CAST SUPPLIES) IMPLANT
SPLINT PLASTER XTRA FASTSET 3X (CAST SUPPLIES)
STOCKINETTE 4X48 STRL (DRAPES) ×3 IMPLANT
SUT ETHILON 4 0 PS 2 18 (SUTURE) ×3 IMPLANT
SUT VIC AB 2-0 SH 27 (SUTURE) ×3
SUT VIC AB 2-0 SH 27XBRD (SUTURE) ×1 IMPLANT
SUT VICRYL 4-0 PS2 18IN ABS (SUTURE) ×3 IMPLANT
SYR BULB 3OZ (MISCELLANEOUS) ×3 IMPLANT
SYR CONTROL 10ML LL (SYRINGE) IMPLANT
TOWEL GREEN STERILE FF (TOWEL DISPOSABLE) ×5 IMPLANT
UNDERPAD 30X30 (UNDERPADS AND DIAPERS) ×3 IMPLANT

## 2019-03-21 NOTE — Discharge Instructions (Addendum)
NO TYLENOL BEFORE 1 PM TODAY!       Hand Center Instructions Hand Surgery  Wound Care: Keep your hand elevated above the level of your heart.  Do not allow it to dangle by your side.  Keep the dressing dry and do not remove it unless your doctor advises you to do so.  He will usually change it at the time of your post-op visit.  Moving your fingers is advised to stimulate circulation but will depend on the site of your surgery.  If you have a splint applied, your doctor will advise you regarding movement.  Activity: Do not drive or operate machinery today.  Rest today and then you may return to your normal activity and work as indicated by your physician.  Diet:  Drink liquids today or eat a light diet.  You may resume a regular diet tomorrow.    General expectations: Pain for two to three days. Fingers may become slightly swollen.  Call your doctor if any of the following occur: Severe pain not relieved by pain medication. Elevated temperature. Dressing soaked with blood. Inability to move fingers. White or bluish color to fingers.      Regional Anesthesia Blocks  1. Numbness or the inability to move the "blocked" extremity may last from 3-48 hours after placement. The length of time depends on the medication injected and your individual response to the medication. If the numbness is not going away after 48 hours, call your surgeon.  2. The extremity that is blocked will need to be protected until the numbness is gone and the  Strength has returned. Because you cannot feel it, you will need to take extra care to avoid injury. Because it may be weak, you may have difficulty moving it or using it. You may not know what position it is in without looking at it while the block is in effect.  3. For blocks in the legs and feet, returning to weight bearing and walking needs to be done carefully. You will need to wait until the numbness is entirely gone and the strength has  returned. You should be able to move your leg and foot normally before you try and bear weight or walk. You will need someone to be with you when you first try to ensure you do not fall and possibly risk injury.  4. Bruising and tenderness at the needle site are common side effects and will resolve in a few days.  5. Persistent numbness or new problems with movement should be communicated to the surgeon or the [facility] [phone])/ [facility] [phone]).        Post Anesthesia Home Care Instructions  Activity: Get plenty of rest for the remainder of the day. A responsible individual must stay with you for 24 hours following the procedure.  For the next 24 hours, DO NOT: -Drive a car -Advertising copywriterperate machinery -Drink alcoholic beverages -Take any medication unless instructed by your physician -Make any legal decisions or sign important papers.  Meals: Start with liquid foods such as gelatin or soup. Progress to regular foods as tolerated. Avoid greasy, spicy, heavy foods. If nausea and/or vomiting occur, drink only clear liquids until the nausea and/or vomiting subsides. Call your physician if vomiting continues.  Special Instructions/Symptoms: Your throat may feel dry or sore from the anesthesia or the breathing tube placed in your throat during surgery. If this causes discomfort, gargle with warm salt water. The discomfort should disappear within 24  hours.  If you had a scopolamine patch placed behind your ear for the management of post- operative nausea and/or vomiting:  1. The medication in the patch is effective for 72 hours, after which it should be removed.  Wrap patch in a tissue and discard in the trash. Wash hands thoroughly with soap and water. 2. You may remove the patch earlier than 72 hours if you experience unpleasant side effects which may include dry mouth, dizziness or visual disturbances. 3. Avoid touching the patch. Wash your hands with  soap and water after contact with the patch.       Post Anesthesia Home Care Instructions  Activity: Get plenty of rest for the remainder of the day. A responsible individual must stay with you for 24 hours following the procedure.  For the next 24 hours, DO NOT: -Drive a car -Paediatric nurse -Drink alcoholic beverages -Take any medication unless instructed by your physician -Make any legal decisions or sign important papers.  Meals: Start with liquid foods such as gelatin or soup. Progress to regular foods as tolerated. Avoid greasy, spicy, heavy foods. If nausea and/or vomiting occur, drink only clear liquids until the nausea and/or vomiting subsides. Call your physician if vomiting continues.  Special Instructions/Symptoms: Your throat may feel dry or sore from the anesthesia or the breathing tube placed in your throat during surgery. If this causes discomfort, gargle with warm salt water. The discomfort should disappear within 24 hours.  If you had a scopolamine patch placed behind your ear for the management of post- operative nausea and/or vomiting:  1. The medication in the patch is effective for 72 hours, after which it should be removed.  Wrap patch in a tissue and discard in the trash. Wash hands thoroughly with soap and water. 2. You may remove the patch earlier than 72 hours if you experience unpleasant side effects which may include dry mouth, dizziness or visual disturbances. 3. Avoid touching the patch. Wash your hands with soap and water after contact with the patch.

## 2019-03-21 NOTE — H&P (Signed)
Chase Briggs is an 68 y.o. male.   Chief Complaint: numbness right hand HPI: Tres is a 68 year old right-hand-dominant male referred by Dr. Aline Brochure for consultation regarding pain numbness and tingling both hands right greater than left. This been going on for at least 6 months. He states his right is much worse than his left with an aching pain with a VAS score 10/10. Wakens him up almost every night. He has not had any treatment for it. Thumb through ring fingers are involved on the left side all fingers on his right side. He has a history of an injury to his left wrist 4 years ago. He has no history of injury to his neck. States driving his car frequently increases symptoms for him. He has had nerve conductions done by Dr. Wilmer Floor feeling bilateral carpal tunnel syndrome and ulnar neuropathy at his right elbow. 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 He has been given medicines by Dr. doing quite but does not remember what they were. He said one was an anti-inflammatory. He he has had injections in the past which caused his sugar to significantly rise. He has a motor delay of 5 7 on the left 6 3 on the right with a diminution of the ulnar nerve at the elbow on the right side. He has a history of diabetes no history of thyroid problems or gout. He does have history of injury to the left wrist. Family history is positive diabetes negative for the remainder.     Past Medical History:  Diagnosis Date  . Diabetes mellitus   . Fatty liver   . GERD (gastroesophageal reflux disease)   . HTN (hypertension)   . Hyperlipidemia   . Insomnia     Past Surgical History:  Procedure Laterality Date  . ABDOMINAL AORTOGRAM W/LOWER EXTREMITY N/A 08/22/2018   Procedure: ABDOMINAL AORTOGRAM W/LOWER EXTREMITY;  Surgeon: Waynetta Sandy, MD;  Location: [place] [facility];  Service: Cardiovascular;  Laterality: N/A;  . BACK SURGERY    . COLONOSCOPY  01/2007   Dr. Arnoldo Morale,  reviewed report, mild diverticulosis. Prep adequate. Next TCS due 01/2017  . ESOPHAGOGASTRODUODENOSCOPY N/A 10/21/2012   KNL:[id number] GASTRITIS  . MASS EXCISION Right 10/26/2012   Procedure: EXCISION MASS;  Surgeon: Jamesetta So, MD;  Location: [facility];  Service: General;  Laterality: Right;  . PERIPHERAL VASCULAR INTERVENTION  08/22/2018   Procedure: PERIPHERAL VASCULAR INTERVENTION;  Surgeon: Waynetta Sandy, MD;  Location: [place] [facility];  Service: Cardiovascular;;  bilateral external iliac stents  . scalp mass  4-5 yrs ago   Dr Arnoldo Morale    Family History  Problem Relation Age of Onset  . Diabetes Mother   . Diabetes Father   . Diabetes Brother   . Colon cancer Neg Hx   . Liver disease Neg Hx    Social History:  reports that he has been smoking cigarettes. He has a 12.50 pack-year smoking history. He has never used smokeless tobacco. He reports that he does not drink alcohol or use drugs.  Allergies: No Known Allergies  No medications prior to admission.    No results found for this or any previous visit (from the past 48 hour(s)).  No results found.   Pertinent items are noted in HPI.  Height 5\' 7"  (1.702 m), weight 65.8 kg.  General appearance: alert, cooperative and appears stated age Head: Normocephalic, without obvious abnormality Neck: no JVD Resp: clear to auscultation bilaterally Cardio: regular rate and  rhythm, S1, S2 normal, no murmur, click, rub or gallop GI: soft, non-tender; bowel sounds normal; no masses,  no organomegaly Extremities:  numbness right hand Pulses: 2+ and symmetric Skin: Skin color, texture, turgor normal. No rashes or lesions Neurologic: Grossly normal Incision/Wound: na  Assessment/Plan  Assessment:  1. Bilateral carpal tunnel syndrome  2. Entrapment of right ulnar nerve   Plan: We would recommend surgical decompression of the median nerve on his right side ulnar nerve on his right elbow. Transposition is dictated by  position following release at the elbow. Pre-peri-and postoperative course are discussed with him and his wife Cindee Lameete. He is advised that there is no guarantee to the surgery the possibility of infection recurrence injury to arteries nerves tendons complete relief symptoms dystrophy. Is advised we are attempting to halt the procedure get the nerve the opportunity to get better but cannot make it get better. 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 He would like to proceed he is scheduled for cubital tunnel release with decompression possible transposition of the ulnar nerve at his right elbow along with carpal tunnel release right hand. Would not recommend rushing him into surgical intervention for his scaphoid problem.    Cindee SaltGary Ashiya Kinkead 03/21/2019, 4:50 AM

## 2019-03-21 NOTE — Op Note (Signed)
NAME: Chase Briggs MEDICAL RECORD NO: [id number] DATE OF BIRTH: [birth year] [facility] LOCATION: [facility] PHYSICIAN: Nicki ReaperGARY R. Armen Waring, MD   OPERATIVE REPORT   DATE OF PROCEDURE: 03/21/19    PREOPERATIVE DIAGNOSIS:   Carpal tunnel syndrome and cubital tunnel syndrome right arm   POSTOPERATIVE DIAGNOSIS:   Same   PROCEDURE:  1 Decompression median nerve at the wrist  2 decompression ulnar nerve at the elbow right arm SURGEON: Cindee SaltGary Laporche Martelle, M.D.   ASSISTANT: none   ANESTHESIA:  Regional with sedation   INTRAVENOUS FLUIDS:  Per anesthesia flow sheet.   ESTIMATED BLOOD LOSS:  Minimal.   COMPLICATIONS:  None.   SPECIMENS:  none   TOURNIQUET TIME:    Total Tourniquet Time Documented: Upper Arm (Right) - 29 minutes Total: Upper Arm (Right) - 29 minutes    DISPOSITION:  Stable to PACU.   INDICATIONS: Patient is a 68 year old male with a history of numbness and tingling of his right arm.  Nerve conductions are positive.  This is not responded to conservative treatment he is elected to undergo surgical decompression possible transposition to the ulnar nerve at the elbow decompression median nerve at the wrist.  Pre-peri-and postoperative course been discussed along with risks and complications.  He is aware that there is no guarantee to the surgery the possibility of infection recurrence injury to arteries nerves tendons and complete relief symptoms dystrophy.  He is aware that we are attempting to halt the process and given the nerves the opportunity to improve but there is no guarantee that they will do that.  Preoperative area the patient is seen extremity marked by both patient and surgeon antibiotic given  OPERATIVE COURSE: Patient is brought the operating room after supraclavicular block was carried out without difficulty in the preoperative area under the direction the anesthesia department.  Was prepped using ChloraPrep and in supine position with  the right arm free.  A three-minute dry time was allowed and timeout taken confirming patient procedure.  The limb was exsanguinated with an Esmarch bandage tourniquet placed high in the arm was inflated to 250 mmHg.  The carpal canal was addressed first.  A longitudinal incision was made in the palm carried down through subcutaneous tissue.  Bleeders were electrocauterized with bipolar.  The palmar fascia was split.  Superficial palmar arch was identified.  The flexor tendon to the ring little finger was identified.  Retractors were placed retracting the median nerve radially.  An incision was then made in the distal flexor retinaculum was realized that the median nerve was bifid.  That a portion of it was still on the ulnar side of the retractor.  The retractor was readjusted.  The dissection was then carried proximally along the ulnar border of the flexor retinaculum taking this off just radial to the hamate hook.  A right angle and stool retractor were placed between skin and forearm fascia the deep structures dissected free with blunt dissection.  The flexor retinaculum proximally and distal forearm fascia was then released on the ulnar side of the palmaris longus for approximately 2 cm proximal to the wrist crease under direct vision.  The canal was explored.  An area compression to the nerve was apparent.  The motor branch was noted to enter and enter into muscle distally.  The wound was copiously irrigated with saline.  The skin was closed with 4-0 nylon sutures.  The ulnar nerve was now addressed.  A an incision was then made over  the medial epicondyle of the elbow carried down through subcutaneous tissue.  Bleeders were again electrocauterized.  The posterior branches of the medial antebrachial cutaneous nerve of the forearm were identified and protected as much as possible.  Retractors were then placed between the skin and forearm fascia after incising Chase's fascia on its posterior aspect.  The  nerve was identified.  The dissection carried proximally with a knee retractor placed between the skin and flexor carpi ulnaris.  The flexor carpi ulnaris superficial fascia was then incised and the muscle split.  The deep fascia was then released using a K MI carpal tunnel guide and angled ENT scissors.  Attention was then directed proximally.  The brachial fascia was then isolated from the overlying skin and subcutaneous tissue.  And the retractor was placed proximally.  The ulnar nerve was then freed from the fascia.  The [facility] guide was in place proximally the ENT scissors were then used to release the brachial fascia proximally.  Each released proximally and distally measured approximately 8 cm.  The elbow was placed in full flexion no subluxation of the nerve was apparent.  The area wound was irrigated.  The Chase fascia was then closed to the poked posterior sit subcutaneous tissue with interrupted 2-0 Vicryl in the subcutaneous tissue with interrupted 4-0 Vicryl and skin with interrupted 4 nylon sutures.  A sterile compressive dressing to the arm was placed.  On deflation of the tourniquet all fingers immediately pink.  He was taken to the recovery room for observation in satisfactory condition.  He will be discharged home to return hand center [facility] in 1 week Tylenol ibuprofen for pain with Norco as a backup.  Daryll Brod, MD Electronically signed, 03/21/19

## 2019-03-21 NOTE — Anesthesia Procedure Notes (Signed)
Procedure Name: LMA Insertion Date/Time: 03/21/2019 8:41 AM Performed by: Gwyndolyn Saxon, CRNA Pre-anesthesia Checklist: Patient identified, Emergency Drugs available, Suction available and Patient being monitored Patient Re-evaluated:Patient Re-evaluated prior to induction Oxygen Delivery Method: Circle system utilized Preoxygenation: Pre-oxygenation with 100% oxygen Induction Type: IV induction Ventilation: Mask ventilation without difficulty LMA: LMA inserted LMA Size: 4.0 Number of attempts: 1 Placement Confirmation: positive ETCO2 and breath sounds checked- equal and bilateral Tube secured with: Tape Dental Injury: Teeth and Oropharynx as per pre-operative assessment

## 2019-03-21 NOTE — Brief Op Note (Signed)
03/21/2019  9:23 AM  PATIENT:  Chase Briggs  68 y.o. male  PRE-OPERATIVE DIAGNOSIS:  RIGHT CUBITAL TUNNEL  POST-OPERATIVE DIAGNOSIS:  RIGHT CUBITAL TUNNEL  PROCEDURE:  Procedure(s) with comments: RIGHT CUBITAL  TUNNEL RELEASE (Right) - AXILLARY BLOCK DECOMPRESSION RIGHT ULNAR NERVE (Right)  SURGEON:  Surgeon(s) and Role:    * Daryll Brod, MD - Primary  PHYSICIAN ASSISTANT:   ASSISTANTS: none   ANESTHESIA:   regional and IV sedation  EBL:  2 mL   BLOOD ADMINISTERED:none  DRAINS: none   LOCAL MEDICATIONS USED:  NONE  SPECIMEN:  No Specimen  DISPOSITION OF SPECIMEN:  N/A  COUNTS:  YES  TOURNIQUET:   Total Tourniquet Time Documented: Upper Arm (Right) - 29 minutes Total: Upper Arm (Right) - 29 minutes   DICTATION: .Viviann Spare Dictation  PLAN OF CARE: Discharge to home after PACU  PATIENT DISPOSITION:  PACU - hemodynamically stable.

## 2019-03-21 NOTE — Transfer of Care (Signed)
Immediate Anesthesia Transfer of Care Note  Patient: Chase Briggs  Procedure(s) Performed: RIGHT CUBITAL  TUNNEL RELEASE (Right ) DECOMPRESSION RIGHT ULNAR NERVE (Right )  Patient Location: PACU  Anesthesia Type:General and Regional  Level of Consciousness: drowsy and patient cooperative  Airway & Oxygen Therapy: Patient Spontanous Breathing and Patient connected to nasal cannula oxygen  Post-op Assessment: Report given to RN and Post -op Vital signs reviewed and stable  Post vital signs: Reviewed and stable  Last Vitals:  Vitals Value Taken Time  BP 121/73 03/21/19 0928  Temp    Pulse 78 03/21/19 0931  Resp 18 03/21/19 0931  SpO2 100 % 03/21/19 0931  Vitals shown include unvalidated device data.  Last Pain:  Vitals:   03/21/19 0646  TempSrc: Oral      Patients Stated Pain Goal: 0 (35/36/14 4315)  Complications: No apparent anesthesia complications

## 2019-03-21 NOTE — Progress Notes (Signed)
Assisted Dr. Turk with right, ultrasound guided, supraclavicular block. Side rails up, monitors on throughout procedure. See vital signs in flow sheet. Tolerated Procedure well. 

## 2019-03-21 NOTE — Anesthesia Postprocedure Evaluation (Signed)
Anesthesia Post Note  Patient: Emrys E Biever  Procedure(s) Performed: RIGHT CARPAL TUNNEL AND CUBITAL  TUNNEL RELEASES (Right ) DECOMPRESSION RIGHT ULNAR NERVE (Right )     Patient location during evaluation: PACU Anesthesia Type: General Level of consciousness: awake and alert, awake and oriented Pain management: pain level controlled Vital Signs Assessment: post-procedure vital signs reviewed and stable Respiratory status: spontaneous breathing, nonlabored ventilation and respiratory function stable Cardiovascular status: blood pressure returned to baseline and stable Postop Assessment: no apparent nausea or vomiting Anesthetic complications: no    Last Vitals:  Vitals:   03/21/19 1001 03/21/19 1015  BP:  129/61  Pulse:  66  Resp:  18  Temp:    SpO2: 100% 97%    Last Pain:  Vitals:   03/21/19 1015  TempSrc:   PainSc: 0-No pain                 Catalina Gravel

## 2019-03-21 NOTE — Anesthesia Procedure Notes (Signed)
Anesthesia Regional Block: Supraclavicular block   Pre-Anesthetic Checklist: ,, timeout performed, Correct Patient, Correct Site, Correct Laterality, Correct Procedure, Correct Position, site marked, Risks and benefits discussed,  Surgical consent,  Pre-op evaluation,  At surgeon's request and post-op pain management  Laterality: Right  Prep: chloraprep       Needles:  Injection technique: Single-shot  Needle Type: Echogenic Needle     Needle Length: 9cm  Needle Gauge: 21     Additional Needles:   Procedures:,,,, ultrasound used (permanent image in chart),,,,  Narrative:  Start time: 03/21/2019 7:55 AM End time: 03/21/2019 8:05 AM Injection made incrementally with aspirations every 5 mL.  Performed by: Personally  Anesthesiologist: Catalina Gravel, MD  Additional Notes: No pain on injection. No increased resistance to injection. Injection made in 5cc increments.  Good needle visualization.  Patient tolerated procedure well.

## 2019-03-21 NOTE — Anesthesia Preprocedure Evaluation (Addendum)
Anesthesia Evaluation  Patient identified by MRN, date of birth, ID band Patient awake    Reviewed: Allergy & Precautions, NPO status , Patient's Chart, lab work & pertinent test results  History of Anesthesia Complications Negative for: history of anesthetic complications  Airway Mallampati: II  TM Distance: >3 FB Neck ROM: Full    Dental  (+) Dental Advisory Given, Lower Dentures, Upper Dentures   Pulmonary Current Smoker,    Pulmonary exam normal breath sounds clear to auscultation       Cardiovascular hypertension, Pt. on medications + Peripheral Vascular Disease  Normal cardiovascular exam Rhythm:Regular Rate:Normal     Neuro/Psych negative neurological ROS  negative psych ROS   GI/Hepatic Neg liver ROS, GERD  ,  Endo/Other  diabetes, Type 2, Insulin Dependent  Renal/GU Renal InsufficiencyRenal disease     Musculoskeletal negative musculoskeletal ROS (+) RIGHT CUBITAL TUNNEL   Abdominal   Peds  Hematology  (+) Blood dyscrasia (Plavix), ,   Anesthesia Other Findings Day of surgery medications reviewed with the patient.  Reproductive/Obstetrics                            Anesthesia Physical Anesthesia Plan  ASA: II  Anesthesia Plan: General   Post-op Pain Management:  Regional for Post-op pain   Induction: Intravenous  PONV Risk Score and Plan: 1 and Midazolam, Dexamethasone and Ondansetron  Airway Management Planned: LMA  Additional Equipment:   Intra-op Plan:   Post-operative Plan: Extubation in OR  Informed Consent: I have reviewed the patients History and Physical, chart, labs and discussed the procedure including the risks, benefits and alternatives for the proposed anesthesia with the patient or authorized representative who has indicated his/her understanding and acceptance.     Dental advisory given  Plan Discussed with: CRNA  Anesthesia Plan Comments:          Anesthesia Quick Evaluation

## 2019-03-22 ENCOUNTER — Encounter (HOSPITAL_BASED_OUTPATIENT_CLINIC_OR_DEPARTMENT_OTHER): Payer: Self-pay | Admitting: Orthopedic Surgery

## 2019-03-29 DIAGNOSIS — E559 Vitamin D deficiency, unspecified: Secondary | ICD-10-CM | POA: Diagnosis not present

## 2019-03-29 DIAGNOSIS — R634 Abnormal weight loss: Secondary | ICD-10-CM | POA: Diagnosis not present

## 2019-03-29 DIAGNOSIS — I10 Essential (primary) hypertension: Secondary | ICD-10-CM | POA: Diagnosis not present

## 2019-03-29 DIAGNOSIS — R202 Paresthesia of skin: Secondary | ICD-10-CM | POA: Diagnosis not present

## 2019-04-04 ENCOUNTER — Other Ambulatory Visit: Payer: Self-pay

## 2019-04-04 DIAGNOSIS — I739 Peripheral vascular disease, unspecified: Secondary | ICD-10-CM

## 2019-04-10 ENCOUNTER — Telehealth (HOSPITAL_COMMUNITY): Payer: Self-pay

## 2019-04-10 NOTE — Telephone Encounter (Signed)

## 2019-04-11 ENCOUNTER — Ambulatory Visit: Payer: Medicare Other | Admitting: Family

## 2019-04-11 ENCOUNTER — Ambulatory Visit (HOSPITAL_COMMUNITY)
Admission: RE | Admit: 2019-04-11 | Discharge: 2019-04-11 | Disposition: A | Discharge status: Home / Self Care | Payer: Medicare Other | Source: Ambulatory Visit | Attending: Family | Admitting: Family

## 2019-04-11 ENCOUNTER — Other Ambulatory Visit: Payer: Self-pay

## 2019-04-11 ENCOUNTER — Ambulatory Visit (INDEPENDENT_AMBULATORY_CARE_PROVIDER_SITE_OTHER)
Admission: RE | Admit: 2019-04-11 | Discharge: 2019-04-11 | Disposition: A | Discharge status: Home / Self Care | Payer: Medicare Other | Source: Ambulatory Visit | Attending: Family | Admitting: Family

## 2019-04-11 DIAGNOSIS — I739 Peripheral vascular disease, unspecified: Secondary | ICD-10-CM

## 2019-04-17 ENCOUNTER — Ambulatory Visit (INDEPENDENT_AMBULATORY_CARE_PROVIDER_SITE_OTHER): Payer: Medicare Other | Admitting: Family

## 2019-04-17 ENCOUNTER — Other Ambulatory Visit: Payer: Self-pay

## 2019-04-17 ENCOUNTER — Encounter: Payer: Self-pay | Admitting: Family

## 2019-04-17 VITALS — BP 137/80 | Ht 67.0 in | Wt 146.0 lb

## 2019-04-17 DIAGNOSIS — I771 Stricture of artery: Secondary | ICD-10-CM | POA: Diagnosis not present

## 2019-04-17 DIAGNOSIS — Z95828 Presence of other vascular implants and grafts: Secondary | ICD-10-CM | POA: Diagnosis not present

## 2019-04-17 DIAGNOSIS — F17200 Nicotine dependence, unspecified, uncomplicated: Secondary | ICD-10-CM | POA: Diagnosis not present

## 2019-04-17 NOTE — Progress Notes (Signed)
Virtual Visit via Telephone Note  I connected with Chase Briggs on 04/17/2019 using the [employer] by telephone and verified that I was speaking with the correct person using two identifiers. Patient was located at his home and accompanied by himself. I am located at the [facility] office[facility]clinic.   The limitations of evaluation and management by telemedicine and the availability of in person appointments have been previously discussed with the patient and are documented in the patients chart. The patient expressed understanding and consented to proceed.  PCP: Wilson SingerGosrani, Nimish C, MD  Chief Complaint: Follow up peripheral artery occlusive disease  History of Present Illness: Chase Briggs is a 68 y.o. male who is s/p stenting of bilateral external iliac arteries on 08-22-18 by Dr. Randie Heinzain for  bilateral lower extremity claudication.  Dr. Arbie CookeyEarly last evaluated pt on 10-11-18. At that time noninvasive studies showed improvement with ankle arm index from 0.8 bilaterally to 1.0 bilaterally following the procedure.  Duplex of his external iliac showed no evidence of stenosis Pt was to continue his walking program.  Continue Plavix as well. Dr. Arbie CookeyEarly discussed with the patient and his wife the critical importance of smoking cessation.  He reported that he had cut back. Dr. Arbie CookeyEarly explained that this is the most important thing he can do for his health.  Pt was to follow up in 6 months with repeat noninvasive studies.  At that time we will consider discontinuation of his Plavix.  Pt states his hips hurt after walking uphill after 1/10 mile, relieved by rest. He denies any wounds on his lower extremities.   Diabetic: Yes, states it was <6.0 Tobacco use: smoker  (3 cigs/day ppd x 25 yrs)  Pt meds include: Statin :no Betablocker: No ASA: Yes Other anticoagulants/antiplatelets: Plavix, he denies any bleeding issues   Past Medical History:  Diagnosis Date  . Diabetes mellitus   . Fatty liver   .  GERD (gastroesophageal reflux disease)   . HTN (hypertension)   . Hyperlipidemia   . Insomnia     Past Surgical History:  Procedure Laterality Date  . ABDOMINAL AORTOGRAM W/LOWER EXTREMITY N/A 08/22/2018   Procedure: ABDOMINAL AORTOGRAM W/LOWER EXTREMITY;  Surgeon: Maeola Harmanain, Brandon Christopher, MD;  [facility];  Service: Cardiovascular;  Laterality: N/A;  . BACK SURGERY    . CARPAL TUNNEL WITH CUBITAL TUNNEL Right 03/21/2019   Procedure: RIGHT CARPAL TUNNEL AND CUBITAL  TUNNEL RELEASES;  Surgeon: Cindee SaltKuzma, Gary, MD;  Location: [facility];  Service: Orthopedics;  Laterality: Right;  AXILLARY BLOCK  . COLONOSCOPY  01/2007   Dr. Lovell SheehanJenkins, reviewed report, mild diverticulosis. Prep adequate. Next TCS due 01/2017  . ESOPHAGOGASTRODUODENOSCOPY N/A 10/21/2012   ZOX:[id number]:MODERATE GASTRITIS  . MASS EXCISION Right 10/26/2012   Procedure: EXCISION MASS;  Surgeon: Dalia HeadingMark A Jenkins, MD;  Location: [facility];  Service: General;  Laterality: Right;  . PERIPHERAL VASCULAR INTERVENTION  08/22/2018   Procedure: PERIPHERAL VASCULAR INTERVENTION;  Surgeon: Maeola Harmanain, Brandon Christopher, MD;  [facility];  Service: Cardiovascular;;  bilateral external iliac stents  . scalp mass  4-5 yrs ago   Dr Lovell SheehanJenkins  . ULNAR NERVE TRANSPOSITION Right 03/21/2019   Procedure: DECOMPRESSION RIGHT ULNAR NERVE;  Surgeon: Cindee SaltKuzma, Gary, MD;  Location: [facility];  Service: Orthopedics;  Laterality: Right;    Current Outpatient Medications on File Prior to Visit  Medication Sig Dispense Refill  . amLODipine (NORVASC) 10 MG tablet Take 1 tablet (10 mg total) by  mouth daily. 90 tablet 0  . aspirin EC 81 MG tablet Take 81 mg by mouth daily.    . cholecalciferol (VITAMIN D3) 25 MCG (1000 UT) tablet Take 1,000 Units by mouth daily.    . cloNIDine (CATAPRES) 0.3 MG tablet Take 0.3 mg by mouth at bedtime.    . clopidogrel (PLAVIX) 75 MG tablet TAKE 1 TABLET BY MOUTH DAILY 30 tablet 12  . Ferrous  Sulfate (IRON) 325 (65 Fe) MG TABS Take 325 mg by mouth daily.    Marland Kitchen gabapentin (NEURONTIN) 300 MG capsule     . HYDROcodone-acetaminophen (NORCO) 5-325 MG tablet Take 1 tablet by mouth every 6 (six) hours as needed for moderate pain. 30 tablet 0  . Insulin Glargine, 1 Unit Dial, 300 UNIT/ML SOPN Inject 26 Units into the skin daily with breakfast.    . Insulin Pen Needle (PEN NEEDLES) 32G X 4 MM MISC 1 each by Does not apply route at bedtime. 200 each 2  . lisinopril-hydrochlorothiazide (PRINZIDE,ZESTORETIC) 10-12.5 MG tablet Take 1 tablet by mouth daily.    . mirtazapine (REMERON) 15 MG tablet Take 7.5 mg by mouth daily.    . traZODone (DESYREL) 50 MG tablet Take 50 mg by mouth at bedtime.     No current facility-administered medications on file prior to visit.     12 system ROS was negative unless otherwise noted in HPI   Observations/Objective:  DATA  Bilateral EIA stent duplex (04-11-19): Abdominal Aorta Findings: +-----------+-------+----------+----------+--------+--------+--------+ Location   AP (cm)Trans (cm)PSV (cm/s)WaveformThrombusComments +-----------+-------+----------+----------+--------+--------+--------+ Proximal   2.04   1.93                                         +-----------+-------+----------+----------+--------+--------+--------+ Mid                         72                                 +-----------+-------+----------+----------+--------+--------+--------+ Distal     1.32   1.08      58                                 +-----------+-------+----------+----------+--------+--------+--------+ RT CIA Prox                 139       biphasic                 +-----------+-------+----------+----------+--------+--------+--------+ LT CIA Prox                 172       biphasic                 +-----------+-------+----------+----------+--------+--------+--------+    Right Stent(s):  +---------------+--------+--------+--------+--------+ External iliac PSV cm/sStenosisWaveformComments +---------------+--------+--------+--------+--------+ Prox to Stent  227             biphasic         +---------------+--------+--------+--------+--------+ Proximal Stent 197             biphasic         +---------------+--------+--------+--------+--------+ Mid Stent      184             biphasic         +---------------+--------+--------+--------+--------+ Distal  Stent   181             biphasic         +---------------+--------+--------+--------+--------+ Distal to Stent152             biphasic         +---------------+--------+--------+--------+--------+   Left Stent(s): +---------------+--------+--------+--------+--------+ External iliac PSV cm/sStenosisWaveformComments +---------------+--------+--------+--------+--------+ Prox to Stent  131             biphasic         +---------------+--------+--------+--------+--------+ Proximal Stent 229             biphasic         +---------------+--------+--------+--------+--------+ Mid Stent      231             biphasic         +---------------+--------+--------+--------+--------+ Distal Stent   141             biphasic         +---------------+--------+--------+--------+--------+ Distal to Stent176             biphasic         +---------------+--------+--------+--------+--------+ Summary: Stenosis: +--------------------+-----------+ Location            Stent       +--------------------+-----------+ Right External Iliacno stenosis +--------------------+-----------+ Left External Iliac no stenosis +--------------------+-----------+   ABI (Date: 04-11-19): ABI Findings: +---------+------------------+-----+---------+--------+ Right    Rt Pressure (mmHg)IndexWaveform Comment  +---------+------------------+-----+---------+--------+ Brachial  152                                      +---------+------------------+-----+---------+--------+ ATA      140               0.89 biphasic          +---------+------------------+-----+---------+--------+ PTA      145               0.92 triphasic         +---------+------------------+-----+---------+--------+ Great Toe88                0.56                   +---------+------------------+-----+---------+--------+  +---------+------------------+-----+--------+-------+ Left     Lt Pressure (mmHg)IndexWaveformComment +---------+------------------+-----+--------+-------+ Brachial 157                                    +---------+------------------+-----+--------+-------+ ATA      136               0.87 biphasic        +---------+------------------+-----+--------+-------+ PTA      160               1.02 biphasic        +---------+------------------+-----+--------+-------+ Great Toe119               0.76                 +---------+------------------+-----+--------+-------+  +-------+-----------+-----------+------------+------------+ ABI/TBIToday's ABIToday's TBIPrevious ABIPrevious TBI +-------+-----------+-----------+------------+------------+ Right  0.92       0.56       1.08        0.82         +-------+-----------+-----------+------------+------------+ Left   1.02       0.76       1.04  0.80         +-------+-----------+-----------+------------+------------+ Right ABIs appear decreased compared to prior study on 10/11/2018. Left ABIs appear essentially unchanged compared to prior study on 10/11/2018.   Summary: Right: Resting right ankle-brachial index indicates mild right lower extremity arterial disease. The right toe-brachial index is abnormal. RT great toe pressure = 88 mmHg.  Left: Resting left ankle-brachial index is within normal range. No evidence of significant left lower extremity arterial disease.  The left toe-brachial index is normal. LT Great toe pressure = 119 mmHg.     Assessment and Plan: Duplex of bilateral EIA stents shows no stenosis with all biphasic waveforms. ABI's: right declined from normal to mild disease (tri and biphasic waveforms), left remains normal with biphasic waveforms.    Follow Up Instructions:   Follow up in 6 months with bilateral EIA stent duplex and ABI's.  Graduated walking to 30 minutes total daily in a safe environment.  I counseled patient re smoking cessation, and patient was mailed free resources re smoking cessation.   I discussed the assessment and treatment plan with the patient. The patient was provided an opportunity to ask questions and all were answered. The patient agreed with the plan and demonstrated an understanding of the instructions.   The patient was advised to call back or seek an in-person evaluation if the symptoms worsen or if the condition fails to improve as anticipated.  I spent 10 minutes with the patient via telephone encounter.   Donnalee CurrySigned, Titilayo Hagans [facility] Office: [phone]

## 2019-04-17 NOTE — Patient Instructions (Signed)

## 2019-04-24 ENCOUNTER — Other Ambulatory Visit: Payer: Self-pay

## 2019-04-24 ENCOUNTER — Telehealth: Payer: Self-pay | Admitting: "Endocrinology

## 2019-04-24 NOTE — Telephone Encounter (Signed)
Pt given refills

## 2019-04-24 NOTE — Telephone Encounter (Signed)
Patient is requesting samples of Toujeo until his appt. I did not see it in his chart. Please advise.

## 2019-04-24 NOTE — Telephone Encounter (Signed)
Pt given samples  

## 2019-05-03 DIAGNOSIS — G5603 Carpal tunnel syndrome, bilateral upper limbs: Secondary | ICD-10-CM | POA: Diagnosis not present

## 2019-05-03 DIAGNOSIS — R29898 Other symptoms and signs involving the musculoskeletal system: Secondary | ICD-10-CM | POA: Diagnosis not present

## 2019-05-03 DIAGNOSIS — G5621 Lesion of ulnar nerve, right upper limb: Secondary | ICD-10-CM | POA: Diagnosis not present

## 2019-05-04 DIAGNOSIS — E1165 Type 2 diabetes mellitus with hyperglycemia: Secondary | ICD-10-CM | POA: Diagnosis not present

## 2019-05-04 DIAGNOSIS — N183 Chronic kidney disease, stage 3 (moderate): Secondary | ICD-10-CM | POA: Diagnosis not present

## 2019-05-04 DIAGNOSIS — E1122 Type 2 diabetes mellitus with diabetic chronic kidney disease: Secondary | ICD-10-CM | POA: Diagnosis not present

## 2019-05-05 LAB — COMPLETE METABOLIC PANEL WITH GFR
AG Ratio: 1.4 (calc) (ref 1.0–2.5)
ALT: 11 U/L (ref 9–46)
AST: 17 U/L (ref 10–35)
Albumin: 4.2 g/dL (ref 3.6–5.1)
Alkaline phosphatase (APISO): 80 U/L (ref 35–144)
BUN/Creatinine Ratio: 9 (calc) (ref 6–22)
BUN: 13 mg/dL (ref 7–25)
CO2: 27 mmol/L (ref 20–32)
Calcium: 10 mg/dL (ref 8.6–10.3)
Chloride: 106 mmol/L (ref 98–110)
Creat: 1.47 mg/dL — ABNORMAL HIGH (ref 0.70–1.25)
GFR, Est African American: 56 mL/min/{1.73_m2} — ABNORMAL LOW (ref >=60)
GFR, Est Non African American: 49 mL/min/{1.73_m2} — ABNORMAL LOW (ref >=60)
Globulin: 2.9 g/dL (calc) (ref 1.9–3.7)
Glucose, Bld: 71 mg/dL (ref 65–99)
Potassium: 4.3 mmol/L (ref 3.5–5.3)
Sodium: 141 mmol/L (ref 135–146)
Total Bilirubin: 0.4 mg/dL (ref 0.2–1.2)
Total Protein: 7.1 g/dL (ref 6.1–8.1)

## 2019-05-05 LAB — HEMOGLOBIN A1C
Hgb A1c MFr Bld: 6.1 % of total Hgb — ABNORMAL HIGH (ref <5.7)
Mean Plasma Glucose: 128 (calc)
eAG (mmol/L): 7.1 (calc)

## 2019-05-12 ENCOUNTER — Other Ambulatory Visit: Payer: Self-pay

## 2019-05-12 ENCOUNTER — Telehealth: Payer: Self-pay | Admitting: "Endocrinology

## 2019-05-12 ENCOUNTER — Encounter: Payer: Self-pay | Admitting: "Endocrinology

## 2019-05-12 ENCOUNTER — Ambulatory Visit (INDEPENDENT_AMBULATORY_CARE_PROVIDER_SITE_OTHER): Payer: Medicare Other | Admitting: "Endocrinology

## 2019-05-12 DIAGNOSIS — E782 Mixed hyperlipidemia: Secondary | ICD-10-CM | POA: Diagnosis not present

## 2019-05-12 DIAGNOSIS — N183 Chronic kidney disease, stage 3 (moderate): Secondary | ICD-10-CM | POA: Diagnosis not present

## 2019-05-12 DIAGNOSIS — E1122 Type 2 diabetes mellitus with diabetic chronic kidney disease: Secondary | ICD-10-CM

## 2019-05-12 DIAGNOSIS — E1165 Type 2 diabetes mellitus with hyperglycemia: Secondary | ICD-10-CM

## 2019-05-12 DIAGNOSIS — I10 Essential (primary) hypertension: Secondary | ICD-10-CM

## 2019-05-12 DIAGNOSIS — IMO0002 Reserved for concepts with insufficient information to code with codable children: Secondary | ICD-10-CM

## 2019-05-12 MED ORDER — LEVEMIR FLEXTOUCH 100 UNIT/ML ~~LOC~~ SOPN: 20.0000 [IU] | PEN_INJECTOR | Freq: Every day | SUBCUTANEOUS | 2 refills | Status: DC

## 2019-05-12 NOTE — Progress Notes (Signed)
05/12/2019                                                    Endocrinology Telehealth Visit Follow up Note -During COVID -19 Pandemic  This visit type was conducted due to national recommendations for restrictions regarding the COVID-19 Pandemic  in an effort to limit this patient's exposure and mitigate transmission of the corona virus.  Due to his co-morbid illnesses, Chase Briggs is at  moderate to high risk for complications without adequate follow up.  This format is felt to be most appropriate for him at this time.  I connected with this patient on 05/12/2019   by telephone and verified that I am speaking with the correct person using two identifiers. Chase Briggs, Sep 28, 1950. he has verbally consented to this visit. All issues noted in this document were discussed and addressed. The format was not optimal for physical exam.    Subjective:    Patient ID: Chase Briggs, male    DOB: Sep 28, 1950, PCP Wilson SingerGosrani, Nimish C, MD   Past Medical History:  Diagnosis Date  . Diabetes mellitus   . Fatty liver   . GERD (gastroesophageal reflux disease)   . HTN (hypertension)   . Hyperlipidemia   . Insomnia    Past Surgical History:  Procedure Laterality Date  . ABDOMINAL AORTOGRAM W/LOWER EXTREMITY N/A 08/22/2018   Procedure: ABDOMINAL AORTOGRAM W/LOWER EXTREMITY;  Surgeon: Maeola Harmanain, Brandon Christopher, MD;  [facility];  Service: Cardiovascular;  Laterality: N/A;  . BACK SURGERY    . CARPAL TUNNEL WITH CUBITAL TUNNEL Right 03/21/2019   Procedure: RIGHT CARPAL TUNNEL AND CUBITAL  TUNNEL RELEASES;  Surgeon: Cindee SaltKuzma, Gary, MD;  Location: [facility];  Service: Orthopedics;  Laterality: Right;  AXILLARY BLOCK  . COLONOSCOPY  01/2007   Dr. Lovell SheehanJenkins, reviewed report, mild diverticulosis. Prep adequate. Next TCS due 01/2017  . ESOPHAGOGASTRODUODENOSCOPY N/A 10/21/2012   ZOX:[id number]:MODERATE GASTRITIS  . MASS EXCISION Right 10/26/2012   Procedure: EXCISION MASS;   Surgeon: Dalia HeadingMark A Jenkins, MD;  Location: [facility];  Service: General;  Laterality: Right;  . PERIPHERAL VASCULAR INTERVENTION  08/22/2018   Procedure: PERIPHERAL VASCULAR INTERVENTION;  Surgeon: Maeola Harmanain, Brandon Christopher, MD;  [facility];  Service: Cardiovascular;;  bilateral external iliac stents  . scalp mass  4-5 yrs ago   Dr Lovell SheehanJenkins  . ULNAR NERVE TRANSPOSITION Right 03/21/2019   Procedure: DECOMPRESSION RIGHT ULNAR NERVE;  Surgeon: Cindee SaltKuzma, Gary, MD;  Location:  SURGERY CENTER;  Service: Orthopedics;  Laterality: Right;   Social History   Socioeconomic History  . Marital status: Married    Spouse name: Not on file  . Number of children: 1  . Years of education: Not on file  . Highest education level: Not on file  Occupational History    Comment: [employer], heavy lifting  Social Needs  . Financial resource strain: Not on file  . Food insecurity    Worry: Not on file    Inability: Not on file  . Transportation needs    Medical: Not on file    Non-medical: Not on file  Tobacco Use  . Smoking status: Current Every Day Smoker    Packs/day: 0.50    Years: 25.00    Pack years: 12.50    Types: Cigarettes  . Smokeless  tobacco: Never Used  Substance and Sexual Activity  . Alcohol use: No    Comment: hx heavy etoh x 3 yrs, quit 30+ yrs ago  . Drug use: No  . Sexual activity: Yes    Birth control/protection: None  Lifestyle  . Physical activity    Days per week: Not on file    Minutes per session: Not on file  . Stress: Not on file  Relationships  . Social Musician on phone: Not on file    Gets together: Not on file    Attends religious service: Not on file    Active member of club or organization: Not on file    Attends meetings of clubs or organizations: Not on file    Relationship status: Not on file  Other Topics Concern  . Not on file  Social History Narrative  . Not on file   Outpatient Encounter Medications as of 05/12/2019   Medication Sig  . [DISCONTINUED] Insulin Detemir (LEVEMIR) 100 UNIT/ML Pen Inject 20 Units into the skin daily.  Marland Kitchen amLODipine (NORVASC) 10 MG tablet Take 1 tablet (10 mg total) by mouth daily.  Marland Kitchen aspirin EC 81 MG tablet Take 81 mg by mouth daily.  . cholecalciferol (VITAMIN D3) 25 MCG (1000 UT) tablet Take 1,000 Units by mouth daily.  . cloNIDine (CATAPRES) 0.3 MG tablet Take 0.3 mg by mouth at bedtime.  . clopidogrel (PLAVIX) 75 MG tablet TAKE 1 TABLET BY MOUTH DAILY  . Ferrous Sulfate (IRON) 325 (65 Fe) MG TABS Take 325 mg by mouth daily.  Marland Kitchen gabapentin (NEURONTIN) 300 MG capsule   . HYDROcodone-acetaminophen (NORCO) 5-325 MG tablet Take 1 tablet by mouth every 6 (six) hours as needed for moderate pain.  . Insulin Detemir (LEVEMIR FLEXTOUCH) 100 UNIT/ML Pen Inject 20 Units into the skin daily.  . Insulin Pen Needle (PEN NEEDLES) 32G X 4 MM MISC 1 each by Does not apply route at bedtime.  Marland Kitchen lisinopril-hydrochlorothiazide (PRINZIDE,ZESTORETIC) 10-12.5 MG tablet Take 1 tablet by mouth daily.  . mirtazapine (REMERON) 15 MG tablet Take 7.5 mg by mouth daily.  . traZODone (DESYREL) 50 MG tablet Take 50 mg by mouth at bedtime.  . [DISCONTINUED] Insulin Glargine, 1 Unit Dial, 300 UNIT/ML SOPN Inject 26 Units into the skin daily with breakfast.   No facility-administered encounter medications on file as of 05/12/2019.    ALLERGIES: No Known Allergies VACCINATION STATUS:  There is no immunization history on file for this patient.  Diabetes He presents for his follow-up diabetic visit. He has type 2 diabetes mellitus. Onset time: He was diagnosed at approximate age of 35 years. His disease course has been improving. There are no hypoglycemic associated symptoms. Pertinent negatives for hypoglycemia include no confusion, headaches, pallor or seizures. Pertinent negatives for diabetes include no chest pain, no fatigue, no polydipsia, no polyphagia, no polyuria and no weakness. There are no  hypoglycemic complications. Symptoms are improving (He recently lost control of his glycemia due to exposure to heavy dose steroids.). Risk factors for coronary artery disease include diabetes mellitus, dyslipidemia, hypertension, male sex and sedentary lifestyle. He is compliant with treatment most of the time. His weight is fluctuating minimally. He is following a generally unhealthy diet. When asked about meal planning, he reported none. He has not had a previous visit with a dietitian. His home blood glucose trend is increasing steadily. His breakfast blood glucose range is generally 90-110 mg/dl. His bedtime blood glucose range is generally 130-140 mg/dl.  His overall blood glucose range is 130-140 mg/dl. An ACE inhibitor/angiotensin II receptor blocker is being taken. Eye exam is current.  Hypertension This is a chronic problem. The current episode started more than 1 year ago. The problem has been gradually improving since onset. The problem is uncontrolled. Pertinent negatives include no chest pain, headaches, neck pain, palpitations or shortness of breath. Risk factors for coronary artery disease include smoking/tobacco exposure, sedentary lifestyle, dyslipidemia, diabetes mellitus and family history. Past treatments include ACE inhibitors, calcium channel blockers and central alpha agonists. The current treatment provides no improvement. Compliance problems include psychosocial issues.   Hyperlipidemia This is a chronic problem. The current episode started more than 1 year ago. Exacerbating diseases include diabetes. Pertinent negatives include no chest pain, myalgias or shortness of breath. Current antihyperlipidemic treatment includes statins. Risk factors for coronary artery disease include dyslipidemia, diabetes mellitus, male sex, hypertension and a sedentary lifestyle.     Review of Systems  Constitutional: Negative for fatigue and unexpected weight change.  HENT: Negative for dental  problem, mouth sores and trouble swallowing.   Eyes: Negative for visual disturbance.  Respiratory: Negative for cough, choking, chest tightness, shortness of breath and wheezing.   Cardiovascular: Negative for chest pain, palpitations and leg swelling.  Gastrointestinal: Negative for abdominal distention, abdominal pain, constipation, diarrhea, nausea and vomiting.  Endocrine: Negative for polydipsia, polyphagia and polyuria.  Genitourinary: Negative for dysuria, flank pain, hematuria and urgency.  Musculoskeletal: Negative for back pain, gait problem, myalgias and neck pain.  Skin: Negative for pallor, rash and wound.  Neurological: Negative for seizures, syncope, weakness, numbness and headaches.  Psychiatric/Behavioral: Negative.  Negative for confusion and dysphoric mood.    Objective:    There were no vitals taken for this visit.  Wt Readings from Last 3 Encounters:  04/17/19 146 lb (66.2 kg)  03/21/19 141 lb 5 oz (64.1 kg)  11/16/18 150 lb (68 kg)     Complete Blood Count (Most recent): Lab Results  Component Value Date   WBC 7.2 09/16/2018   HGB 12.8 (L) 09/16/2018   HCT 39.5 09/16/2018   MCV 94.5 09/16/2018   PLT 202 09/16/2018   Chemistry (most recent): Lab Results  Component Value Date   NA 141 05/04/2019   K 4.3 05/04/2019   CL 106 05/04/2019   CO2 27 05/04/2019   BUN 13 05/04/2019   CREATININE 1.47 (H) 05/04/2019   Diabetic Labs (most recent): Lab Results  Component Value Date   HGBA1C 6.1 (H) 05/04/2019   HGBA1C 6.7 12/27/2018   HGBA1C 10.8 (H) 09/28/2018   Lipid Panel     Component Value Date/Time   CHOL 104 08/20/2017 0725   TRIG 84 08/20/2017 0725   HDL 43 08/20/2017 0725   CHOLHDL 2.4 08/20/2017 0725   LDLCALC 45 08/20/2017 0725     Assessment & Plan:   1. Type 2 diabetes mellitus without complication, without long-term current use of insulin (HCC)  -He reports tightening fasting blood glucose profile between 54 and 165, evening blood  glucose readings between 113-189.  Previsit labs show A1c of 6.1% improving from 10.8%.   He is a chronic heavy smoker, remains at a high risk for more acute and chronic complications of diabetes which include CAD, CVA, CKD, retinopathy, and neuropathy. These are all discussed in detail with the patient.    Recent labs reviewed, stage 3 renal insufficiency. - I have re-counseled the patient on diet management  by adopting a carbohydrate restricted / protein rich  Diet.  - he  admits there is a room for improvement in his diet and drink choices. -  Suggestion is made for him to avoid simple carbohydrates  from his diet including Cakes, Sweet Desserts / Pastries, Ice Cream, Soda (diet and regular), Sweet Tea, Candies, Chips, Cookies, Sweet Pastries,  Store Bought Juices, Alcohol in Excess of  1-2 drinks a day, Artificial Sweeteners, Coffee Creamer, and "Sugar-free" Products. This will help patient to have stable blood glucose profile and potentially avoid unintended weight gain.   - Patient is advised to stick to a routine mealtimes to eat 3 meals  a day and avoid unnecessary snacks ( to snack only to correct hypoglycemia).  - I have approached patient with the following individualized plan to manage diabetes and patient agrees.  - He has benefited from insulin initiation.   - Based on  his presentation with tightly controlled glycemic profile both fasting and postprandial, he will not require prandial insulin for now.  He is advised to lower his Levemir to 20 units at bedtime, continue monitoring blood glucose at least twice a day-daily before breakfast and at bedtime.     - He will call clinic if he registers blood glucose readings less than 70 or greater than 200 mg/dL.  -   He has stage 3 renal insufficiency , will stay off of metformin for now. If his renal insufficiency continues, he will need consultation with nephrology.    - Patient is not a candidate for  incretin therapy . - Patient  specific target  for A1c; LDL, HDL, Triglycerides, and  Waist Circumference were discussed in detail.  2) BP/HTN: he is advised to home monitor blood pressure and report if > 140/90 on 2 separate readings.    He has 3 medications including losartan, clonidine, and amlodipine.  He continues to smoke heavily.  He is advised to be consistent taking his medications. - re-counseled him about smoking cessation.   3) Lipids/HPL: Recent lipid panel showed LDL of 45.  He is not on statins at the moment.   4)  Weight/Diet: He has regained 20+ lbs  overall and this is a good development for him.  - He would benefit from addition of more complex carbohydrates, information provided. CDE consult is requested.  5. Vitamin D deficiency - His insurance would not allow for 25 hydroxy vitamin D recheck. - He will continue with vitamin D3 5000 units daily for 90 days.   6) Chronic Care/Health Maintenance:  -Patient is on ACEI/ARB and Statin medications and encouraged to continue to follow up with Ophthalmology, Podiatrist at least yearly or according to recommendations, and advised to  Quit  smoking. I have recommended yearly flu vaccine and pneumonia vaccination at least every 5 years; moderate intensity exercise for up to 150 minutes weekly; and  sleep for at least 7 hours a day. -He is counseled extensively for smoking cessation.  - I advised patient to maintain close follow up with [name], MD for primary care needs. - I have advised him to avoid over-the-counter pain medications to avoid additional risk for renal insufficiency.  - Patient Care Time Today:  25 min, of which >50% was spent in  counseling and the rest reviewing his  current and  previous labs/studies, previous treatments, his blood glucose readings, and medications' doses and developing a plan for long-term care based on the latest recommendations for standards of care.   Roseanna Rainbow Stang participated in the discussions,  expressed understanding,  and voiced agreement with the above plans.  All questions were answered to his satisfaction. he is encouraged to contact clinic should he have any questions or concerns prior to his return visit.    Follow up plan: -Return in about 3 months (around 08/12/2019) for Bring Meter and Logs- A1c in Office, Include 6 log sheets.  Marquis LunchGebre Alya Smaltz, MD Phone: [phone]  Fax: [phone]  This note was partially dictated with voice recognition software. Similar sounding words can be transcribed inadequately or may not  be corrected upon review.  05/12/2019, 12:45 PM

## 2019-05-12 NOTE — Telephone Encounter (Signed)
Patient needs a script for relion prime lancets and test strips. [employer]

## 2019-05-15 ENCOUNTER — Other Ambulatory Visit: Payer: Self-pay

## 2019-05-15 MED ORDER — INSULIN GLARGINE 100 UNIT/ML SOLOSTAR PEN: 20.0000 [IU] | PEN_INJECTOR | Freq: Every day | SUBCUTANEOUS | 2 refills | Status: DC

## 2019-05-15 MED ORDER — LANCETS MISC: 1.0000 | Freq: Two times a day (BID) | 5 refills | Status: AC

## 2019-05-15 MED ORDER — GLUCOSE BLOOD VI STRP: 1.0000 | ORAL_STRIP | Freq: Two times a day (BID) | 5 refills | Status: AC

## 2019-05-15 NOTE — Telephone Encounter (Signed)
Patient came into office. Forwarding to you. Please advise.

## 2019-05-15 NOTE — Telephone Encounter (Signed)
Rx sent 

## 2019-05-15 NOTE — Telephone Encounter (Signed)
RX sent

## 2019-05-27 ENCOUNTER — Other Ambulatory Visit (INDEPENDENT_AMBULATORY_CARE_PROVIDER_SITE_OTHER): Payer: Self-pay | Admitting: Internal Medicine

## 2019-06-15 ENCOUNTER — Other Ambulatory Visit (INDEPENDENT_AMBULATORY_CARE_PROVIDER_SITE_OTHER): Payer: Self-pay | Admitting: Internal Medicine

## 2019-06-15 ENCOUNTER — Telehealth (INDEPENDENT_AMBULATORY_CARE_PROVIDER_SITE_OTHER): Payer: Self-pay | Admitting: Internal Medicine

## 2019-06-15 MED ORDER — LISINOPRIL-HYDROCHLOROTHIAZIDE 10-12.5 MG PO TABS: 1.0000 | ORAL_TABLET | Freq: Every day | ORAL | 0 refills | Status: DC

## 2019-06-15 NOTE — Telephone Encounter (Signed)
done

## 2019-06-29 ENCOUNTER — Other Ambulatory Visit (INDEPENDENT_AMBULATORY_CARE_PROVIDER_SITE_OTHER): Payer: Self-pay | Admitting: Internal Medicine

## 2019-06-29 ENCOUNTER — Telehealth (INDEPENDENT_AMBULATORY_CARE_PROVIDER_SITE_OTHER): Payer: Self-pay | Admitting: Internal Medicine

## 2019-06-29 MED ORDER — AMLODIPINE BESYLATE 10 MG PO TABS: 10.0000 mg | ORAL_TABLET | Freq: Every day | ORAL | 0 refills | Status: DC

## 2019-06-29 NOTE — Telephone Encounter (Signed)
done

## 2019-07-10 ENCOUNTER — Encounter (INDEPENDENT_AMBULATORY_CARE_PROVIDER_SITE_OTHER): Payer: Self-pay | Admitting: Internal Medicine

## 2019-07-10 ENCOUNTER — Other Ambulatory Visit: Payer: Self-pay

## 2019-07-10 ENCOUNTER — Ambulatory Visit (INDEPENDENT_AMBULATORY_CARE_PROVIDER_SITE_OTHER): Payer: Medicare Other | Admitting: Internal Medicine

## 2019-07-10 VITALS — BP 160/80 | HR 64 | Ht 67.5 in | Wt 143.0 lb

## 2019-07-10 DIAGNOSIS — IMO0002 Reserved for concepts with insufficient information to code with codable children: Secondary | ICD-10-CM

## 2019-07-10 DIAGNOSIS — E1165 Type 2 diabetes mellitus with hyperglycemia: Secondary | ICD-10-CM

## 2019-07-10 DIAGNOSIS — I10 Essential (primary) hypertension: Secondary | ICD-10-CM

## 2019-07-10 DIAGNOSIS — E1122 Type 2 diabetes mellitus with diabetic chronic kidney disease: Secondary | ICD-10-CM

## 2019-07-10 DIAGNOSIS — E559 Vitamin D deficiency, unspecified: Secondary | ICD-10-CM

## 2019-07-10 DIAGNOSIS — N183 Chronic kidney disease, stage 3 unspecified: Secondary | ICD-10-CM | POA: Diagnosis not present

## 2019-07-10 DIAGNOSIS — E782 Mixed hyperlipidemia: Secondary | ICD-10-CM

## 2019-07-10 NOTE — Progress Notes (Signed)
Wellness Office Visit  Subjective:  Patient ID: Chase Briggs, male    DOB: [birth year]  Age: 68 y.o. MRN: [id number]  CC: This man comes in for follow-up of his multiple medical problems including hypertension, vitamin D deficiency, diabetes, gastroesophageal reflux disease. HPI  He is doing reasonably well and he follows with endocrinology for his diabetes.  His last hemoglobin A1c was extremely good at 6.1%.  He says he has been more active but I suspect he is also probably eating better. He continues on antihypertensive medications.  He denies any chest pain, dyspnea, palpitations or limb weakness. He has an appointment to see an eye doctor in January of next year. He has increase his vitamin D3 supplementation to 5000 units daily. He continues to take insulin for his diabetes. Past Medical History:  Diagnosis Date  . Diabetes mellitus   . Fatty liver   . GERD (gastroesophageal reflux disease)   . HTN (hypertension)   . Hyperlipidemia   . Insomnia       Family History  Problem Relation Age of Onset  . Diabetes Mother   . Diabetes Father   . Diabetes Brother   . Colon cancer Neg Hx   . 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 Married for 12 years. Has 2 sons (33 and 21). Lives with wife.     Current Meds  Medication Sig  . amLODipine (NORVASC) 10 MG tablet Take 1 tablet (10 mg total) by mouth daily.  Marland Kitchen aspirin EC 81 MG tablet Take 81 mg by mouth daily.  . Cholecalciferol (VITAMIN D) 125 MCG (5000 UT) CAPS Take 5,000 Units by mouth daily.   . cloNIDine (CATAPRES) 0.3 MG tablet TAKE (1) TABLET BY MOUTH AT BEDTIME.  . clopidogrel (PLAVIX) 75 MG tablet TAKE 1 TABLET BY MOUTH DAILY  . Ferrous Sulfate (IRON) 325 (65 Fe) MG TABS Take 325 mg by mouth daily.  Marland Kitchen glucose blood test strip 1 each by Other route 2 (two) times daily. Use as instructed bid E11.65  Relion Premier  . Insulin Glargine (LANTUS) 100 UNIT/ML Solostar Pen Inject 20 Units into the skin at bedtime.  . Insulin Pen Needle (PEN NEEDLES) 32G X 4 MM MISC 1 each by Does not apply route at bedtime.  . Lancets MISC 1 each by Does not apply route 2 (two) times daily. E11.65 Relion Premier  . lisinopril-hydrochlorothiazide (ZESTORETIC) 10-12.5 MG tablet Take 1 tablet by mouth daily.  . traZODone (DESYREL) 50 MG tablet TAKE ONE TABLET DAILY AT BEDTIME.  . [DISCONTINUED] gabapentin (NEURONTIN) 300 MG capsule   . [DISCONTINUED] HYDROcodone-acetaminophen (NORCO) 5-325 MG tablet Take 1 tablet by mouth every 6 (six) hours as needed for moderate pain.  . [DISCONTINUED] mirtazapine (REMERON) 15 MG tablet Take 7.5 mg by mouth daily.      Objective:   Today's Vitals: BP (!) 160/80   Pulse 64   Ht 5' 7.5" (1.715 m)   Wt 143 lb (64.9 kg)   BMI 22.07 kg/m  Vitals with BMI 07/10/2019 04/17/2019 03/21/2019  Height 5' 7.5" 5\' 7"  -  Weight 143 lbs 146 lbs -  BMI 22.05 22.86 -  Systolic 160 137  Diastolic 80 80 77  Pulse 64 - 54     Physical Exam   He looks systemically well.  Blood pressure is elevated systolically today, which is rather  unusual for him.  He will keep this under close monitor.    Assessment   1. Essential hypertension   2. Uncontrolled type 2 diabetes mellitus with stage 3 chronic kidney disease, without long-term current use of insulin ([place])   3. Vitamin D deficiency   4. Mixed hyperlipidemia       Tests ordered Orders Placed This Encounter  Procedures  . COMPLETE METABOLIC PANEL WITH GFR  . VITAMIN D 25 Hydroxy (Vit-D Deficiency, Fractures)     Plan: 1. His blood pressure is uncontrolled to some degree today but he will monitor this and let me know. 2. Diabetes is managed by the endocrinologist but his last hemoglobin A1c is not was in a good range.  He does not have symptoms of hypoglycemia. 3. He will continue with vitamin D3 at the current dose and  we will check levels today. 4. He has a follow-up appointment with Judson Roch in March of next year for an annual Medicare wellness visit which he will keep.  I offered him influenza and pneumonia vaccination today but he declined.   No orders of the defined types were placed in this encounter.   Doree Albee, MD

## 2019-07-11 LAB — COMPLETE METABOLIC PANEL WITH GFR
AG Ratio: 1.5 (calc) (ref 1.0–2.5)
ALT: 10 U/L (ref 9–46)
AST: 13 U/L (ref 10–35)
Albumin: 4.2 g/dL (ref 3.6–5.1)
Alkaline phosphatase (APISO): 79 U/L (ref 35–144)
BUN/Creatinine Ratio: 8 (calc) (ref 6–22)
BUN: 12 mg/dL (ref 7–25)
CO2: 24 mmol/L (ref 20–32)
Calcium: 9.7 mg/dL (ref 8.6–10.3)
Chloride: 108 mmol/L (ref 98–110)
Creat: 1.56 mg/dL — ABNORMAL HIGH (ref 0.70–1.25)
GFR, Est African American: 52 mL/min/{1.73_m2} — ABNORMAL LOW (ref >=60)
GFR, Est Non African American: 45 mL/min/{1.73_m2} — ABNORMAL LOW (ref >=60)
Globulin: 2.8 g/dL (calc) (ref 1.9–3.7)
Glucose, Bld: 60 mg/dL — ABNORMAL LOW (ref 65–99)
Potassium: 4 mmol/L (ref 3.5–5.3)
Sodium: 142 mmol/L (ref 135–146)
Total Bilirubin: 0.5 mg/dL (ref 0.2–1.2)
Total Protein: 7 g/dL (ref 6.1–8.1)

## 2019-07-11 LAB — VITAMIN D 25 HYDROXY (VIT D DEFICIENCY, FRACTURES): Vit D, 25-Hydroxy: 62 ng/mL (ref 30–100)

## 2019-07-31 DIAGNOSIS — E1142 Type 2 diabetes mellitus with diabetic polyneuropathy: Secondary | ICD-10-CM | POA: Diagnosis not present

## 2019-07-31 DIAGNOSIS — G5601 Carpal tunnel syndrome, right upper limb: Secondary | ICD-10-CM | POA: Diagnosis not present

## 2019-07-31 DIAGNOSIS — G5622 Lesion of ulnar nerve, left upper limb: Secondary | ICD-10-CM | POA: Diagnosis not present

## 2019-08-15 ENCOUNTER — Other Ambulatory Visit: Payer: Self-pay

## 2019-08-15 ENCOUNTER — Encounter: Payer: Self-pay | Admitting: "Endocrinology

## 2019-08-15 ENCOUNTER — Ambulatory Visit (INDEPENDENT_AMBULATORY_CARE_PROVIDER_SITE_OTHER): Payer: Medicare Other | Admitting: "Endocrinology

## 2019-08-15 VITALS — BP 161/73 | HR 93 | Ht 67.5 in | Wt 143.0 lb

## 2019-08-15 DIAGNOSIS — E782 Mixed hyperlipidemia: Secondary | ICD-10-CM

## 2019-08-15 DIAGNOSIS — N183 Chronic kidney disease, stage 3 unspecified: Secondary | ICD-10-CM | POA: Diagnosis not present

## 2019-08-15 DIAGNOSIS — E1122 Type 2 diabetes mellitus with diabetic chronic kidney disease: Secondary | ICD-10-CM | POA: Diagnosis not present

## 2019-08-15 DIAGNOSIS — E1165 Type 2 diabetes mellitus with hyperglycemia: Secondary | ICD-10-CM

## 2019-08-15 DIAGNOSIS — I10 Essential (primary) hypertension: Secondary | ICD-10-CM | POA: Diagnosis not present

## 2019-08-15 DIAGNOSIS — IMO0002 Reserved for concepts with insufficient information to code with codable children: Secondary | ICD-10-CM

## 2019-08-15 LAB — POCT GLYCOSYLATED HEMOGLOBIN (HGB A1C): Hemoglobin A1C: 6.4 % — AB (ref 4.0–5.6)

## 2019-08-15 MED ORDER — INSULIN GLARGINE 100 UNIT/ML SOLOSTAR PEN: 15.0000 [IU] | PEN_INJECTOR | Freq: Every day | SUBCUTANEOUS | 2 refills | Status: DC

## 2019-08-15 NOTE — Progress Notes (Signed)
08/15/2019   Endocrinology follow-up note    Subjective:    Patient ID: Chase Briggs, male    DOB: 1951-08-14, PCP [name], MD   Past Medical History:  Diagnosis Date  . Diabetes mellitus   . Fatty liver   . GERD (gastroesophageal reflux disease)   . HTN (hypertension)   . Hyperlipidemia   . Insomnia    Past Surgical History:  Procedure Laterality Date  . ABDOMINAL AORTOGRAM W/LOWER EXTREMITY N/A 08/22/2018   Procedure: ABDOMINAL AORTOGRAM W/LOWER EXTREMITY;  Surgeon: [name], MD;  [facility] LAB;  Service: Cardiovascular;  Laterality: N/A;  . BACK SURGERY    . CARPAL TUNNEL WITH CUBITAL TUNNEL Right 03/21/2019   Procedure: RIGHT CARPAL TUNNEL AND CUBITAL  TUNNEL RELEASES;  Surgeon: [name], MD;  Location: [facility];  Service: Orthopedics;  Laterality: Right;  AXILLARY BLOCK  . COLONOSCOPY  01/2007   Dr. [name], reviewed report, mild diverticulosis. Prep adequate. Next TCS due 01/2017  . ESOPHAGOGASTRODUODENOSCOPY N/A 10/21/2012   ONG:[id number] GASTRITIS  . MASS EXCISION Right 10/26/2012   Procedure: EXCISION MASS;  Surgeon: [name], MD;  Location: [facility];  Service: General;  Laterality: Right;  . PERIPHERAL VASCULAR INTERVENTION  08/22/2018   Procedure: PERIPHERAL VASCULAR INTERVENTION;  Surgeon: [name], MD;  Location: [facility] INVASIVE CV LAB;  Service: Cardiovascular;;  bilateral external iliac stents  . scalp mass  4-5 yrs ago   Dr [name]  . ULNAR NERVE TRANSPOSITION Right 03/21/2019   Procedure: DECOMPRESSION RIGHT ULNAR NERVE;  Surgeon: [name], MD;  Location: [facility];  Service: Orthopedics;  Laterality: Right;   Social History   Socioeconomic History  . Marital status: Married    Spouse name: Not on file  . Number of children: 1  . Years of education: Not on file  . Highest education level: Not on file  Occupational History    Comment: [employer], heavy lifting   Social Needs  . Financial resource strain: Not on file  . Food insecurity    Worry: Not on file    Inability: Not on file  . Transportation needs    Medical: Not on file    Non-medical: Not on file  Tobacco Use  . Smoking status: Current Every Day Smoker    Packs/day: 0.50    Years: 25.00    Pack years: 12.50    Types: Cigarettes  . Smokeless tobacco: Never Used  Substance and Sexual Activity  . Alcohol use: No    Comment: hx heavy etoh x 3 yrs, quit 30+ yrs ago  . Drug use: No  . Sexual activity: Yes    Birth control/protection: None  Lifestyle  . Physical activity    Days per week: Not on file    Minutes per session: Not on file  . Stress: Not on file  Relationships  . Social Musician on phone: Not on file    Gets together: Not on file    Attends religious service: Not on file    Active member of club or organization: Not on file    Attends meetings of clubs or organizations: Not on file    Relationship status: Not on file  Other Topics Concern  . 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 Highest level of education: 12th grade. Married for 12 years. Has 2 sons (33 and 21). Lives with wife.  Outpatient Encounter Medications as of 08/15/2019  Medication Sig  . amLODipine (NORVASC) 10 MG tablet Take 1 tablet (10 mg total) by mouth daily.  Marland Kitchen aspirin EC 81 MG tablet Take 81 mg by mouth daily.  . Cholecalciferol (VITAMIN D) 125 MCG (5000 UT) CAPS Take 5,000 Units by mouth daily.   . cloNIDine (CATAPRES) 0.3 MG tablet TAKE (1) TABLET BY MOUTH AT BEDTIME.  . clopidogrel (PLAVIX) 75 MG tablet TAKE 1 TABLET BY MOUTH DAILY  . Ferrous Sulfate (IRON) 325 (65 Fe) MG TABS Take 325 mg by mouth daily.  Marland Kitchen glucose blood test strip 1 each by Other route 2 (two) times daily. Use as instructed bid E11.65 Relion Premier  . Insulin Glargine (LANTUS) 100 UNIT/ML Solostar Pen Inject 15 Units into the skin at bedtime.  . Insulin Pen Needle  (PEN NEEDLES) 32G X 4 MM MISC 1 each by Does not apply route at bedtime.  . Lancets MISC 1 each by Does not apply route 2 (two) times daily. E11.65 Relion Premier  . lisinopril-hydrochlorothiazide (ZESTORETIC) 10-12.5 MG tablet Take 1 tablet by mouth daily.  . traZODone (DESYREL) 50 MG tablet TAKE ONE TABLET DAILY AT BEDTIME.  . [DISCONTINUED] amLODipine (NORVASC) 10 MG tablet Take 1 tablet (10 mg total) by mouth daily.  . [DISCONTINUED] cloNIDine (CATAPRES) 0.3 MG tablet Take 0.3 mg by mouth at bedtime.  . [DISCONTINUED] Insulin Glargine (LANTUS) 100 UNIT/ML Solostar Pen Inject 20 Units into the skin at bedtime.  . [DISCONTINUED] traZODone (DESYREL) 50 MG tablet Take 50 mg by mouth at bedtime.   No facility-administered encounter medications on file as of 08/15/2019.    ALLERGIES: No Known Allergies VACCINATION STATUS:  There is no immunization history on file for this patient.  Diabetes He presents for his follow-up diabetic visit. He has type 2 diabetes mellitus. Onset time: He was diagnosed at approximate age of 78 years. His disease course has been improving. There are no hypoglycemic associated symptoms. Pertinent negatives for hypoglycemia include no confusion, headaches, pallor or seizures. Pertinent negatives for diabetes include no chest pain, no fatigue, no polydipsia, no polyphagia, no polyuria and no weakness. There are no hypoglycemic complications. Symptoms are improving (He recently lost control of his glycemia due to exposure to heavy dose steroids.). Risk factors for coronary artery disease include diabetes mellitus, dyslipidemia, hypertension, male sex and sedentary lifestyle. He is compliant with treatment most of the time. His weight is fluctuating minimally. He is following a generally unhealthy diet. When asked about meal planning, he reported none. He has not had a previous visit with a dietitian. His home blood glucose trend is increasing steadily. His breakfast blood  glucose range is generally 90-110 mg/dl. His bedtime blood glucose range is generally 130-140 mg/dl. His overall blood glucose range is 130-140 mg/dl. An ACE inhibitor/angiotensin II receptor blocker is being taken. Eye exam is current.  Hypertension This is a chronic problem. The current episode started more than 1 year ago. The problem has been gradually improving since onset. The problem is uncontrolled. Pertinent negatives include no chest pain, headaches, neck pain, palpitations or shortness of breath. Risk factors for coronary artery disease include smoking/tobacco exposure, sedentary lifestyle, dyslipidemia, diabetes mellitus and family history. Past treatments include ACE inhibitors, calcium channel blockers and central alpha agonists. The current treatment provides no improvement. Compliance problems include psychosocial issues.   Hyperlipidemia This is a chronic problem. The current episode started more than 1 year ago. Exacerbating diseases include diabetes. Pertinent negatives include no chest  pain, myalgias or shortness of breath. Current antihyperlipidemic treatment includes statins. Risk factors for coronary artery disease include dyslipidemia, diabetes mellitus, male sex, hypertension and a sedentary lifestyle.     Review of Systems  Constitutional: Negative for fatigue and unexpected weight change.  HENT: Negative for dental problem, mouth sores and trouble swallowing.   Eyes: Negative for visual disturbance.  Respiratory: Negative for cough, choking, chest tightness, shortness of breath and wheezing.   Cardiovascular: Negative for chest pain, palpitations and leg swelling.  Gastrointestinal: Negative for abdominal distention, abdominal pain, constipation, diarrhea, nausea and vomiting.  Endocrine: Negative for polydipsia, polyphagia and polyuria.  Genitourinary: Negative for dysuria, flank pain, hematuria and urgency.  Musculoskeletal: Negative for back pain, gait problem, myalgias  and neck pain.  Skin: Negative for pallor, rash and wound.  Neurological: Negative for seizures, syncope, weakness, numbness and headaches.  Psychiatric/Behavioral: Negative.  Negative for confusion and dysphoric mood.    Objective:    BP (!) 161/73   Pulse 93   Ht 5' 7.5" (1.715 m)   Wt 143 lb (64.9 kg)   BMI 22.07 kg/m   Wt Readings from Last 3 Encounters:  08/15/19 143 lb (64.9 kg)  07/10/19 143 lb (64.9 kg)  04/17/19 146 lb (66.2 kg)     Complete Blood Count (Most recent): Lab Results  Component Value Date   WBC 7.2 09/16/2018   HGB 12.8 (L) 09/16/2018   HCT 39.5 09/16/2018   MCV 94.5 09/16/2018   PLT 202 09/16/2018   Chemistry (most recent): Lab Results  Component Value Date   NA 142 07/10/2019   K 4.0 07/10/2019   CL 108 07/10/2019   CO2 24 07/10/2019   BUN 12 07/10/2019   CREATININE 1.56 (H) 07/10/2019   Diabetic Labs (most recent): Lab Results  Component Value Date   HGBA1C 6.4 (A) 08/15/2019   HGBA1C 6.1 (H) 05/04/2019   HGBA1C 6.7 12/27/2018   Lipid Panel     Component Value Date/Time   CHOL 104 08/20/2017 0725   TRIG 84 08/20/2017 0725   HDL 43 08/20/2017 0725   CHOLHDL 2.4 08/20/2017 0725   LDLCALC 45 08/20/2017 0725     Assessment & Plan:   1. Type 2 diabetes mellitus without complication, without long-term current use of insulin (HCC)  -He returns with controlled fasting glycemic profile and on target postprandial blood glucose readings.  His point-of-care A1c 6.4%, overall improving from 10.8%.     He is a chronic heavy smoker, remains at a high risk for more acute and chronic complications of diabetes which include CAD, CVA, CKD, retinopathy, and neuropathy. These are all discussed in detail with the patient.    Recent labs reviewed, stage 3 renal insufficiency. - I have re-counseled the patient on diet management  by adopting a carbohydrate restricted / protein rich  Diet.  - he  admits there is a room for improvement in his diet  and drink choices. -  Suggestion is made for him to avoid simple carbohydrates  from his diet including Cakes, Sweet Desserts / Pastries, Ice Cream, Soda (diet and regular), Sweet Tea, Candies, Chips, Cookies, Sweet Pastries,  Store Bought Juices, Alcohol in Excess of  1-2 drinks a day, Artificial Sweeteners, Coffee Creamer, and "Sugar-free" Products. This will help patient to have stable blood glucose profile and potentially avoid unintended weight gain.    - Patient is advised to stick to a routine mealtimes to eat 3 meals  a day and avoid unnecessary snacks (  to snack only to correct hypoglycemia).  - I have approached patient with the following individualized plan to manage diabetes and patient agrees.  - He has benefited from insulin initiation.   - Based on  his presentation with tightly controlled glycemic profile both fasting and postprandial, he will not require prandial insulin for now.  He is advised to lower his Lantus to 15 units nightly,  continue monitoring blood glucose at least twice a day-daily before breakfast and at bedtime.     - He will call clinic if he registers blood glucose readings less than 70 or greater than 200 mg/dL.  -   He has stage 3 renal insufficiency , will stay off of metformin for now. If his renal insufficiency continues, he will need consultation with nephrology.    - Patient is not a candidate for  incretin therapy . - Patient specific target  for A1c; LDL, HDL, Triglycerides, and  Waist Circumference were discussed in detail.  2) BP/HTN: His blood pressure is not controlled to target.    He has 3 medications including losartan, clonidine, and amlodipine.  He continues to smoke heavily.  He is advised to be consistent taking his medications. The patient was counseled on the dangers of tobacco use, and was advised to quit.  Reviewed strategies to maximize success, including removing cigarettes and smoking materials from environment.   3) Lipids/HPL:  Recent lipid panel showed LDL of 45.  He is not on statins at the moment.   4)  Weight/Diet: He has maintained his gain of 20 pounds of weight.   - He would benefit from addition of more complex carbohydrates, information provided. CDE consult is requested.  5. Vitamin D deficiency - His insurance would not allow for 25 hydroxy vitamin D recheck. - He will continue with vitamin D3 5000 units daily for 90 days.   6) Chronic Care/Health Maintenance:  -Patient is on ACEI/ARB and Statin medications and encouraged to continue to follow up with Ophthalmology, Podiatrist at least yearly or according to recommendations, and advised to  Quit  smoking. I have recommended yearly flu vaccine and pneumonia vaccination at least every 5 years; moderate intensity exercise for up to 150 minutes weekly; and  sleep for at least 7 hours a day. -He is counseled extensively for smoking cessation.  - I advised patient to maintain close follow up with [name], MD for primary care needs. - I have advised him to avoid over-the-counter pain medications to avoid additional risk for renal insufficiency.  - Time spent with the patient: 25 min, of which >50% was spent in reviewing his blood glucose logs , discussing his hypoglycemia and hyperglycemia episodes, reviewing his current and  previous labs / studies and medications  doses and developing a plan to avoid hypoglycemia and hyperglycemia. Please refer to Patient Instructions for Blood Glucose Monitoring and Insulin/Medications Dosing Guide"  in media tab for additional information. Please  also refer to " Patient Self Inventory" in the Media  tab for reviewed elements of pertinent patient history.  Roseanna Rainbow Sagun participated in the discussions, expressed understanding, and voiced agreement with the above plans.  All questions were answered to his satisfaction. he is encouraged to contact clinic should he have any questions or concerns prior to his return  visit.    Follow up plan: -Return in about 4 months (around 12/14/2019) for Bring Meter and Logs- A1c in Office.  Marquis Lunch, MD Phone: [phone]  Fax: [phone]  This note  was partially dictated with voice recognition software. Similar sounding words can be transcribed inadequately or may not  be corrected upon review.  08/15/2019, 4:23 PM

## 2019-09-14 ENCOUNTER — Other Ambulatory Visit: Payer: Self-pay | Admitting: "Endocrinology

## 2019-09-18 ENCOUNTER — Other Ambulatory Visit (INDEPENDENT_AMBULATORY_CARE_PROVIDER_SITE_OTHER): Payer: Self-pay | Admitting: Internal Medicine

## 2019-09-18 ENCOUNTER — Telehealth (INDEPENDENT_AMBULATORY_CARE_PROVIDER_SITE_OTHER): Payer: Self-pay | Admitting: Internal Medicine

## 2019-09-18 DIAGNOSIS — E119 Type 2 diabetes mellitus without complications: Secondary | ICD-10-CM | POA: Diagnosis not present

## 2019-09-18 LAB — HM DIABETES EYE EXAM

## 2019-09-18 MED ORDER — LISINOPRIL-HYDROCHLOROTHIAZIDE 10-12.5 MG PO TABS: 1.0000 | ORAL_TABLET | Freq: Every day | ORAL | 0 refills | Status: DC

## 2019-09-18 NOTE — Telephone Encounter (Signed)
Done

## 2019-09-29 ENCOUNTER — Other Ambulatory Visit (INDEPENDENT_AMBULATORY_CARE_PROVIDER_SITE_OTHER): Payer: Self-pay | Admitting: Internal Medicine

## 2019-10-02 ENCOUNTER — Telehealth (HOSPITAL_COMMUNITY): Payer: Self-pay

## 2019-10-02 ENCOUNTER — Other Ambulatory Visit: Payer: Self-pay

## 2019-10-02 DIAGNOSIS — I739 Peripheral vascular disease, unspecified: Secondary | ICD-10-CM

## 2019-10-02 DIAGNOSIS — I771 Stricture of artery: Secondary | ICD-10-CM

## 2019-10-02 NOTE — Telephone Encounter (Signed)

## 2019-10-03 ENCOUNTER — Ambulatory Visit (HOSPITAL_COMMUNITY)
Admission: RE | Admit: 2019-10-03 | Discharge: 2019-10-03 | Disposition: A | Discharge status: Home / Self Care | Payer: Medicare Other | Source: Ambulatory Visit | Attending: Vascular Surgery | Admitting: Vascular Surgery

## 2019-10-03 ENCOUNTER — Ambulatory Visit (INDEPENDENT_AMBULATORY_CARE_PROVIDER_SITE_OTHER)
Admission: RE | Admit: 2019-10-03 | Discharge: 2019-10-03 | Disposition: A | Discharge status: Home / Self Care | Payer: Medicare Other | Source: Ambulatory Visit | Attending: Vascular Surgery | Admitting: Vascular Surgery

## 2019-10-03 ENCOUNTER — Other Ambulatory Visit: Payer: Self-pay

## 2019-10-03 DIAGNOSIS — I771 Stricture of artery: Secondary | ICD-10-CM | POA: Diagnosis not present

## 2019-10-03 DIAGNOSIS — I739 Peripheral vascular disease, unspecified: Secondary | ICD-10-CM | POA: Diagnosis not present

## 2019-10-10 ENCOUNTER — Other Ambulatory Visit: Payer: Self-pay

## 2019-10-10 ENCOUNTER — Ambulatory Visit (INDEPENDENT_AMBULATORY_CARE_PROVIDER_SITE_OTHER): Payer: Medicare Other | Admitting: Physician Assistant

## 2019-10-10 ENCOUNTER — Telehealth: Payer: Self-pay | Admitting: *Deleted

## 2019-10-10 DIAGNOSIS — Z95820 Peripheral vascular angioplasty status with implants and grafts: Secondary | ICD-10-CM

## 2019-10-10 DIAGNOSIS — I739 Peripheral vascular disease, unspecified: Secondary | ICD-10-CM | POA: Diagnosis not present

## 2019-10-10 MED ORDER — ATORVASTATIN CALCIUM 10 MG PO TABS: 10.0000 mg | ORAL_TABLET | Freq: Every day | ORAL | 1 refills | Status: DC

## 2019-10-10 NOTE — Progress Notes (Signed)
Virtual Visit via Telephone Note    I connected with Chase Briggs on 10/10/2019 using the  by telephone at [facility] office and verified that I was speaking with the correct person using two identifiers. Patient was located at home   I am located at [facility] office.   The limitations of evaluation and management by telemedicine and the availability of in person appointments have been previously discussed with the patient and are documented in the patients chart. The patient expressed understanding and consented to proceed.  PCP: [name], MD   Chief Complaint: PAD  History of Present Illness: Chase Briggs is a 69 y.o. male for surveillance of B LE PAD s/p B iliac stent placement by Dr. [name] 08/22/18.  He denise history of non healing wounds, claudication symptoms or rest pain.  He denise CP, SOB or changes in medical history since his last visit.   He currently takes Plavix and Asprin daily.  He states he has been on a Statin in the past, but has not taken it in the past year.    Past Medical History:  Diagnosis Date  . Diabetes mellitus   . Fatty liver   . GERD (gastroesophageal reflux disease)   . HTN (hypertension)   . Hyperlipidemia   . Insomnia     Past Surgical History:  Procedure Laterality Date  . ABDOMINAL AORTOGRAM W/LOWER EXTREMITY N/A 08/22/2018   Procedure: ABDOMINAL AORTOGRAM W/LOWER EXTREMITY;  Surgeon: [name], MD;  Location: [facility] INVASIVE CV LAB;  Service: Cardiovascular;  Laterality: N/A;  . BACK SURGERY    . CARPAL TUNNEL WITH CUBITAL TUNNEL Right 03/21/2019   Procedure: RIGHT CARPAL TUNNEL AND CUBITAL  TUNNEL RELEASES;  Surgeon: [name], MD;  Location: [facility];  Service: Orthopedics;  Laterality: Right;  AXILLARY BLOCK  . COLONOSCOPY  01/2007   Dr. [name], reviewed report, mild diverticulosis. Prep adequate. Next TCS due 01/2017  . ESOPHAGOGASTRODUODENOSCOPY N/A 10/21/2012   [id number] GASTRITIS  . MASS  EXCISION Right 10/26/2012   Procedure: EXCISION MASS;  Surgeon: [name], MD;  Location: [facility];  Service: General;  Laterality: Right;  . PERIPHERAL VASCULAR INTERVENTION  08/22/2018   Procedure: PERIPHERAL VASCULAR INTERVENTION;  Surgeon: [name], MD;  Location: [facility] INVASIVE CV LAB;  Service: Cardiovascular;;  bilateral external iliac stents  . scalp mass  4-5 yrs ago   Dr [name]  . ULNAR NERVE TRANSPOSITION Right 03/21/2019   Procedure: DECOMPRESSION RIGHT ULNAR NERVE;  Surgeon: [name], MD;  Location: [facility];  Service: Orthopedics;  Laterality: Right;    Current Meds  Medication Sig  . amLODipine (NORVASC) 10 MG tablet TAKE (1) TABLET BY MOUTH ONCE DAILY.  Marland Kitchen aspirin EC 81 MG tablet Take 81 mg by mouth daily.  . Cholecalciferol (VITAMIN D) 125 MCG (5000 UT) CAPS Take 5,000 Units by mouth daily.   . cloNIDine (CATAPRES) 0.3 MG tablet TAKE (1) TABLET BY MOUTH AT BEDTIME.  . clopidogrel (PLAVIX) 75 MG tablet TAKE 1 TABLET BY MOUTH DAILY  . Ferrous Sulfate (IRON) 325 (65 Fe) MG TABS Take 325 mg by mouth daily.  Marland Kitchen glucose blood test strip 1 each by Other route 2 (two) times daily. Use as instructed bid E11.65 Relion Premier  . Insulin Glargine (LANTUS) 100 UNIT/ML Solostar Pen Inject 15 Units into the skin at bedtime.  . Lancets MISC 1 each by Does not apply route 2 (two) times daily. E11.65 Relion Premier  .  lisinopril-hydrochlorothiazide (ZESTORETIC) 10-12.5 MG tablet Take 1 tablet by mouth daily.  . SURE COMFORT PEN NEEDLES 32G X 4 MM MISC USING ONCE DAILY.  . traZODone (DESYREL) 50 MG tablet TAKE ONE TABLET DAILY AT BEDTIME.    12 system ROS was negative unless otherwise noted in HPI   Observations/Objective:    Right Stent(s): +---------------+---++--------++ Prox to Stent  126biphasic +---------------+---++--------++ Proximal Stent 101biphasic +---------------+---++--------++ Mid Stent      144biphasic  +---------------+---++--------++ Distal Stent   168biphasic +---------------+---++--------++ Distal to Stent152biphasic +---------------+---++--------++     Left Stent(s): +---------------+---++--------++ Prox to Stent  168biphasic +---------------+---++--------++ Proximal Stent 143biphasic +---------------+---++--------++ Mid Stent      141biphasic +---------------+---++--------++ Distal Stent   169biphasic +---------------+---++--------++ Distal to Stent153biphasic +---------------+---++--------++     Summary: Stenosis: +--------------------+-----------+ Location            Stent       +--------------------+-----------+ Right External Iliacno stenosis +--------------------+-----------+ Left External Iliac no stenosis +--------------------+-----------+    ABI Findings: +---------+------------------+-----+----------+--------+ Right    Rt Pressure (mmHg)IndexWaveform  Comment  +---------+------------------+-----+----------+--------+ Brachial 158                                       +---------+------------------+-----+----------+--------+ PTA      125               0.79 monophasic         +---------+------------------+-----+----------+--------+ DP       119               0.75 monophasic         +---------+------------------+-----+----------+--------+ Great Toe122               0.77                    +---------+------------------+-----+----------+--------+  +---------+------------------+-----+---------+-------+ Left     Lt Pressure (mmHg)IndexWaveform Comment +---------+------------------+-----+---------+-------+ Brachial 153                                     +---------+------------------+-----+---------+-------+ PTA      166               1.05 triphasic        +---------+------------------+-----+---------+-------+ DP       163               1.03  triphasic        +---------+------------------+-----+---------+-------+ Great Toe163               1.03                  +---------+------------------+-----+---------+-------+  +-------+-----------+-----------+------------+------------+ ABI/TBIToday's ABIToday's TBIPrevious ABIPrevious TBI +-------+-----------+-----------+------------+------------+ Right  0.79       0.77       0.92        0.56         +-------+-----------+-----------+------------+------------+ Left   1.05       1.03       1.02        0.76         +-------+-----------+-----------+------------+------------+   Previous ABI on 04/11/19.   Summary: Right: Resting right ankle-brachial index indicates moderate right lower extremity arterial disease. The right toe-brachial index is normal. RT great toe pressure = 122 mmHg.  Left: Resting left ankle-brachial index is within normal range. The left toe-brachial index is  normal. LT Great toe pressure = 163 mmHg.  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 [name] 08/22/2018.  He has been asymptomatic without non healing wounds, symptoms of claudication or rest pain .   Right LE ABI's show calcification with falsely elevated pressure and monophasic flow compared to previous ABI's a year ago.  We discussed this with the patient and he has been scheduled for a follow up visit with repeat ABI's and Iliac duplex in 1 year.  I have prescribed Lipitor 10 daily for preventive.     Follow Up Instructions:   Follow up 1 year for repat ABI and Aoto/Iliac duplex    The patient was provided an opportunity to ask questions and all were answered. The patient agreed with the plan and demonstrated an understanding of the instructions.   The patient was advised to call back or seek an in-person evaluation if the symptoms worsen or if the condition fails to improve as anticipated.  I spent 15 minutes with the patient via telephone  encounter.   Signed, Mosetta Pigeon [facility] Office: [phone]  10/10/2019, 9:51 AM

## 2019-10-10 NOTE — Telephone Encounter (Signed)
Virtual Visit Pre-Appointment Phone Call  Today, I spoke with Chase Briggs and performed the following actions:  1. I explained that we are currently trying to limit exposure to the COVID-19 virus by seeing patients at home rather than in the office.  I explained that the visits are best done by video, but can be done by telephone.  I asked the patient if a virtual visit that the patient would like to try instead of coming into the office. Chase Briggs agreed to proceed with the virtual visit scheduled with PA on 10/10/19.    2. I confirmed the BEST phone number to call the day of the visit and- I included this in appointment notes.  3. I asked if the patient had access to (through a family member/friend) a smartphone with video capability to be used for his visit?"  The patient said yes -    4. I confirmed consent by  a. sending through [facility] or by email the [place] as written at the end of this message or  b. verbally as listed below. i. This visit is being performed in the setting of COVID-19. ii. All virtual visits are billed to your insurance company just like a normal visit would be.   iii. We'd like you to understand that the technology does not allow for your provider to perform an examination, and thus may limit your provider's ability to fully assess your condition.  iv. If your provider identifies any concerns that need to be evaluated in person, we will make arrangements to do so.   v. Finally, though the technology is pretty good, we cannot assure that it will always work on either your or our end, and in the setting of a video visit, we may have to convert it to a phone-only visit.  In either situation, we cannot ensure that we have a secure connection.   vi. Are you willing to proceed?"  STAFF: Did the patient verbally acknowledge consent to telehealth visit? Document YES/NO here: YES  2. I advised the patient to be  prepared - I asked that the patient, on the day of his visit, record any information possible with the equipment at his home, such as blood pressure, pulse, oxygen saturation, and your weight and write them all down. I asked the patient to have a pen and paper handy nearby the day of the visit as well.  3. If the patient was scheduled for a video visit, I informed the patient that the visit with the doctor would start with a text to the smartphone # given to [place] by the patient.         If the patient was scheduled for a telephone call, I informed the patient that the visit with the doctor would start with a call to the telephone # given to [place] by the patient.  4. I Informed patient they will receive a phone call 15 minutes prior to their appointment time from a [place] or nurse to review medications, allergies, etc. to prepare for the visit.    TELEPHONE CALL NOTE  Chase Briggs has been deemed a candidate for a follow-up tele-health visit to limit community exposure during the Covid-19 pandemic. I spoke with the patient via phone to ensure availability of phone/video source, confirm preferred email & phone number, and discuss instructions and expectations.  I reminded Chase Briggs to be prepared with any vital sign and/or heart rhythm  information that could potentially be obtained via home monitoring, at the time of his visit. I reminded Chase Briggs to expect a phone call prior to his visit.  Chase Briggs, NT 10/10/2019 9:19 AM     FULL LENGTH CONSENT FOR TELE-HEALTH VISIT   I hereby voluntarily request, consent and authorize [facility] and its employed or contracted physicians, physician assistants, nurse practitioners or other licensed health care professionals (the Practitioner), to provide me with telemedicine health care services (the "Services") as deemed necessary by the treating Practitioner. I acknowledge and consent to receive the Services by the Practitioner  via telemedicine. I understand that the telemedicine visit will involve communicating with the Practitioner through live audiovisual communication technology and the disclosure of certain medical information by electronic transmission. I acknowledge that I have been given the opportunity to request an in-person assessment or other available alternative prior to the telemedicine visit and am voluntarily participating in the telemedicine visit.  I understand that I have the right to withhold or withdraw my consent to the use of telemedicine in the course of my care at any time, without affecting my right to future care or treatment, and that the Practitioner or I may terminate the telemedicine visit at any time. I understand that I have the right to inspect all information obtained and/or recorded in the course of the telemedicine visit and may receive copies of available information for a reasonable fee.  I understand that some of the potential risks of receiving the Services via telemedicine include:  Chase Briggs Delay or interruption in medical evaluation due to technological equipment failure or disruption; . Information transmitted may not be sufficient (e.g. poor resolution of images) to allow for appropriate medical decision making by the Practitioner; and/or  . In rare instances, security protocols could fail, causing a breach of personal health information.  Furthermore, I acknowledge that it is my responsibility to provide information about my medical history, conditions and care that is complete and accurate to the best of my ability. I acknowledge that Practitioner's advice, recommendations, and/or decision may be based on factors not within their control, such as incomplete or inaccurate data provided by me or distortions of diagnostic images or specimens that may result from electronic transmissions. I understand that the practice of medicine is not an exact science and that Practitioner makes no warranties  or guarantees regarding treatment outcomes. I acknowledge that I will receive a copy of this consent concurrently upon execution via email to the email address I last provided but may also request a printed copy by calling the office of [facility].    I understand that my insurance will be billed for this visit.   I have read or had this consent read to me. . I understand the contents of this consent, which adequately explains the benefits and risks of the Services being provided via telemedicine.  . I have been provided ample opportunity to ask questions regarding this consent and the Services and have had my questions answered to my satisfaction. . I give my informed consent for the services to be provided through the use of telemedicine in my medical care  By participating in this telemedicine visit I agree to the above.

## 2019-10-11 ENCOUNTER — Other Ambulatory Visit: Payer: Self-pay | Admitting: *Deleted

## 2019-10-11 DIAGNOSIS — Z95828 Presence of other vascular implants and grafts: Secondary | ICD-10-CM

## 2019-10-11 DIAGNOSIS — I771 Stricture of artery: Secondary | ICD-10-CM

## 2019-10-11 DIAGNOSIS — I739 Peripheral vascular disease, unspecified: Secondary | ICD-10-CM

## 2019-10-13 ENCOUNTER — Other Ambulatory Visit: Payer: Self-pay

## 2019-10-13 MED ORDER — INSULIN GLARGINE 100 UNIT/ML SOLOSTAR PEN: 15.0000 [IU] | PEN_INJECTOR | Freq: Every day | SUBCUTANEOUS | 1 refills | Status: DC

## 2019-11-14 ENCOUNTER — Other Ambulatory Visit: Payer: Self-pay

## 2019-11-14 ENCOUNTER — Encounter (INDEPENDENT_AMBULATORY_CARE_PROVIDER_SITE_OTHER): Payer: Self-pay | Admitting: Nurse Practitioner

## 2019-11-14 ENCOUNTER — Ambulatory Visit (INDEPENDENT_AMBULATORY_CARE_PROVIDER_SITE_OTHER): Payer: Medicare Other | Admitting: Nurse Practitioner

## 2019-11-14 VITALS — BP 159/70 | HR 67 | Temp 98.2°F | Ht 67.5 in | Wt 144.6 lb

## 2019-11-14 DIAGNOSIS — I10 Essential (primary) hypertension: Secondary | ICD-10-CM | POA: Diagnosis not present

## 2019-11-14 DIAGNOSIS — Z1211 Encounter for screening for malignant neoplasm of colon: Secondary | ICD-10-CM | POA: Diagnosis not present

## 2019-11-14 DIAGNOSIS — Z0000 Encounter for general adult medical examination without abnormal findings: Secondary | ICD-10-CM | POA: Diagnosis not present

## 2019-11-14 MED ORDER — LISINOPRIL-HYDROCHLOROTHIAZIDE 20-12.5 MG PO TABS: 1.0000 | ORAL_TABLET | Freq: Every day | ORAL | 3 refills | Status: DC

## 2019-11-14 NOTE — Progress Notes (Signed)
Subjective:   Chase Briggs is a 69 y.o. male who presents for Medicare Annual/Subsequent preventive examination.  Review of Systems:  Reviewed and negative       Objective:    Vitals: BP (!) 159/70 (BP Location: Left Arm, Patient Position: Sitting, Cuff Size: Normal)   Pulse 67   Temp 98.2 F (36.8 C) (Temporal)   Ht 5' 7.5" (1.715 m)   Wt 144 lb 9.6 oz (65.6 kg)   SpO2 96%   BMI 22.31 kg/m   Body mass index is 22.31 kg/m.  Advanced Directives 03/21/2019 09/16/2018 09/11/2018 08/22/2018 10/30/2017 10/27/2017 08/25/2017  Does Patient Have a Medical Advance Directive? No No No No No No No  Would patient like information on creating a medical advance directive? No - Patient declined - No - Patient declined No - Patient declined No - Patient declined - -  Pre-existing out of facility DNR order (yellow form or pink MOST form) - - - - - - -    Tobacco Social History   Tobacco Use  Smoking Status Current Every Day Smoker  . Packs/day: 0.50  . Years: 25.00  . Pack years: 12.50  . Types: Cigarettes  Smokeless Tobacco Never Used     Ready to quit: Not Answered Counseling given: Not Answered   Clinical Intake:  Pre-visit preparation completed: Yes  Pain : No/denies pain     Nutritional Status: BMI of 19-24  Normal Diabetes: Yes CBG done?: Yes(65) Did pt. bring in CBG monitor from home?: No  How often do you need to have someone help you when you read instructions, pamphlets, or other written materials from your doctor or pharmacy?: 1 - Never What is the last grade level you completed in school?: 12  Interpreter Needed?: No  Information entered by :: Venia Carbon, CMA  Past Medical History:  Diagnosis Date  . Diabetes mellitus   . Fatty liver   . GERD (gastroesophageal reflux disease)   . HTN (hypertension)   . Hyperlipidemia   . Insomnia    Past Surgical History:  Procedure Laterality Date  . ABDOMINAL AORTOGRAM W/LOWER EXTREMITY N/A 08/22/2018     Procedure: ABDOMINAL AORTOGRAM W/LOWER EXTREMITY;  Surgeon: [name], MD;  Location: [facility] INVASIVE CV LAB;  Service: Cardiovascular;  Laterality: N/A;  . BACK SURGERY    . CARPAL TUNNEL WITH CUBITAL TUNNEL Right 03/21/2019   Procedure: RIGHT CARPAL TUNNEL AND CUBITAL  TUNNEL RELEASES;  Surgeon: [name], MD;  Location: [facility];  Service: Orthopedics;  Laterality: Right;  AXILLARY BLOCK  . COLONOSCOPY  01/2007   Dr. [name], reviewed report, mild diverticulosis. Prep adequate. Next TCS due 01/2017  . ESOPHAGOGASTRODUODENOSCOPY N/A 10/21/2012   GUY:[id number] GASTRITIS  . MASS EXCISION Right 10/26/2012   Procedure: EXCISION MASS;  Surgeon: [name], MD;  Location: [facility];  Service: General;  Laterality: Right;  . PERIPHERAL VASCULAR INTERVENTION  08/22/2018   Procedure: PERIPHERAL VASCULAR INTERVENTION;  Surgeon: [name], MD;  Location: [facility] INVASIVE CV LAB;  Service: Cardiovascular;;  bilateral external iliac stents  . scalp mass  4-5 yrs ago   Dr [name]  . ULNAR NERVE TRANSPOSITION Right 03/21/2019   Procedure: DECOMPRESSION RIGHT ULNAR NERVE;  Surgeon: [name], MD;  Location: [facility];  Service: Orthopedics;  Laterality: Right;   Family History  Problem Relation Age of Onset  . Diabetes Mother   . Diabetes Father   . Diabetes Brother   . Colon cancer  Neg Hx   . Liver disease Neg Hx    Social History   Socioeconomic History  . Marital status: Married    Spouse name: Not on file  . Number of children: 1  . Years of education: Not on file  . Highest education level: Not on file  Occupational History  . Occupation: reitred    Comment: [employer], heavy lifting  Tobacco Use  . Smoking status: Current Every Day Smoker    Packs/day: 0.50    Years: 25.00    Pack years: 12.50    Types: Cigarettes  . Smokeless tobacco: Never Used  Substance and Sexual Activity  . Alcohol use: No    Comment: hx heavy etoh x 3 yrs,  quit 30+ yrs ago  . Drug use: No  . Sexual activity: Yes    Birth control/protection: None  Other Topics Concern  . 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 Highest level of education: 12th grade. Married for 12 years. Has 2 sons (33 and 21). Lives with wife.   Social Determinants of Health   Financial Resource Strain:   . Difficulty of Paying Living Expenses: Not on file  Food Insecurity:   . Worried About Programme researcher, broadcasting/film/video in the Last Year: Not on file  . Ran Out of Food in the Last Year: Not on file  Transportation Needs:   . Lack of Transportation (Medical): Not on file  . Lack of Transportation (Non-Medical): Not on file  Physical Activity:   . Days of Exercise per Week: Not on file  . Minutes of Exercise per Session: Not on file  Stress:   . Feeling of Stress : Not on file  Social Connections:   . Frequency of Communication with Friends and Family: Not on file  . Frequency of Social Gatherings with Friends and Family: Not on file  . Attends Religious Services: Not on file  . Active Member of Clubs or Organizations: Not on file  . Attends Banker Meetings: Not on file  . Marital Status: Not on file    Outpatient Encounter Medications as of 11/14/2019  Medication Sig  . amLODipine (NORVASC) 10 MG tablet TAKE (1) TABLET BY MOUTH ONCE DAILY.  Marland Kitchen aspirin EC 81 MG tablet Take 81 mg by mouth daily.  Marland Kitchen atorvastatin (LIPITOR) 10 MG tablet Take 1 tablet (10 mg total) by mouth daily.  . Cholecalciferol (VITAMIN D) 125 MCG (5000 UT) CAPS Take 5,000 Units by mouth daily.   . cloNIDine (CATAPRES) 0.3 MG tablet TAKE (1) TABLET BY MOUTH AT BEDTIME.  . clopidogrel (PLAVIX) 75 MG tablet TAKE 1 TABLET BY MOUTH DAILY  . Ferrous Sulfate (IRON) 325 (65 Fe) MG TABS Take 325 mg by mouth daily.  Marland Kitchen glucose blood test strip 1 each by Other route 2 (two) times daily. Use as instructed bid E11.65 Relion Premier  . Insulin Glargine (LANTUS) 100  UNIT/ML Solostar Pen Inject 15 Units into the skin at bedtime.  . Lancets MISC 1 each by Does not apply route 2 (two) times daily. E11.65 Relion Premier  . lisinopril-hydrochlorothiazide (ZESTORETIC) 10-12.5 MG tablet Take 1 tablet by mouth daily.  . SURE COMFORT PEN NEEDLES 32G X 4 MM MISC USING ONCE DAILY.  . traZODone (DESYREL) 50 MG tablet TAKE ONE TABLET DAILY AT BEDTIME.  . [DISCONTINUED] amLODipine (NORVASC) 10 MG tablet Take 1 tablet (10 mg total) by mouth daily.  . [DISCONTINUED] cloNIDine (CATAPRES) 0.3  MG tablet Take 0.3 mg by mouth at bedtime.  . [DISCONTINUED] traZODone (DESYREL) 50 MG tablet Take 50 mg by mouth at bedtime.   No facility-administered encounter medications on file as of 11/14/2019.    Activities of Daily Living In your present state of health, do you have any difficulty performing the following activities: 11/14/2019 03/21/2019  Hearing? N N  Vision? N N  Difficulty concentrating or making decisions? N N  Walking or climbing stairs? N N  Dressing or bathing? N N  Doing errands, shopping? N -  Some recent data might be hidden    Patient Care Team: [name], MD as PCP - General (Internal Medicine) West Bali, MD (Gastroenterology)   Assessment:   This is a routine wellness examination for Peter.  Exercise Activities and Dietary recommendations    Goals   None     Fall Risk Fall Risk  11/14/2019 08/15/2019 01/10/2019 08/31/2017 05/31/2017  Falls in the past year? 0 0 0 No No  Number falls in past yr: 0 - - - -  Injury with Fall? 0 - - - -  Risk for fall due to : No Fall Risks - - - -  Follow up Falls evaluation completed Falls evaluation completed Falls evaluation completed - -   Is the patient's home free of loose throw rugs in walkways, pet beds, electrical cords, etc?   yes      Grab bars in the bathroom? no      Handrails on the stairs?   yes      Adequate lighting?   yes  Timed Get Up and Go Performed: 10  Depression Screen PHQ  2/9 Scores 11/14/2019 08/31/2017 05/31/2017 02/25/2017  PHQ - 2 Score 0 0 0 0  Exception Documentation Medical reason - - -    Cognitive Function     6CIT Screen 11/14/2019  What Year? 0 points  What month? 0 points  What time? 0 points  Count back from 20 0 points  Months in reverse 0 points  Repeat phrase 0 points  Total Score 0     There is no immunization history on file for this patient.  Qualifies for Shingles Vaccine? Yes  Screening Tests Health Maintenance  Topic Date Due  . FOOT EXAM  06/28/1961  . TETANUS/TDAP  06/28/1970  . PNA vac Low Risk Adult (1 of 2 - PCV13) 06/28/2016  . COLONOSCOPY  01/13/2017  . INFLUENZA VACCINE  04/15/2019  . HEMOGLOBIN A1C  02/13/2020  . OPHTHALMOLOGY EXAM  09/17/2020  . Hepatitis C Screening  Completed   Cancer Screenings: Lung: Low Dose CT Chest recommended if Age 34-80 years, 30 pack-year currently smoking OR have quit w/in 15years. Patient does not qualify. Colorectal: Would prefer to undergo fecal immunoglobulin testing versus colonoscopy at this time.  Additional Screenings:  Hepatitis C Screening: Completed and negative      Plan:   We did discussed that patient is due for multiple immunizations.  He would like to hold off on all immunizations at this time due to a fear of needles.  I did discuss the importance of these immunizations and that if you would like these administered to let us know.  He tells me he understands.  I especially discussed that if he has any kind of penetrating wound that he should get the tetanus vaccine administered.  He tells me he understands.  He is due for colon cancer screening.  He would like to hold off on  colonoscopy if possible but is willing to undergo FIT for screening, he does understand that if his results come back abnormal the recommendation would be to probably undergo colonoscopy for further evaluation.  He tells me he would be willing to undergo colonoscopy if this occurs.  He is  up-to-date with other screening recommendations.  Blood pressure is elevated today.  It was also elevated at last office visit.  We discussed making changes to his medications and he is agreeable to this.  I will increase his lisinopril-hydrochlorothiazide dose and he will follow-up in 2 to 4 weeks for blood pressure check and blood work to be completed.  I have personally reviewed and noted the following in the patient's chart:   . Medical and social history . Use of alcohol, tobacco or illicit drugs  . Current medications and supplements . Functional ability and status . Nutritional status . Physical activity . Advanced directives . List of other physicians . Hospitalizations, surgeries, and ER visits in previous 12 months . Vitals . Screenings to include cognitive, depression, and falls . Referrals and appointments  In addition, I have reviewed and discussed with patient certain preventive protocols, quality metrics, and best practice recommendations. A written personalized care plan for preventive services as well as general preventive health recommendations were provided to patient.     Ailene Ards, NP  11/14/2019

## 2019-11-14 NOTE — Patient Instructions (Signed)
Thank you for choosing [facility] as your medical provider! If you have any questions or concerns regarding your health care, please do not hesitate to call our office.  Today we did your annual Medicare wellness visit as well as addressed your hypertension  Stop your current lisinopril-hydrochlorothiazide that you have at home and start taking the new lisinopril-hydrochlorothiazide that was sent to your pharmacy.  Take 1 tablet of the new lisinopril-hydrochlorothiazide (20-12.5 mg) by mouth daily.  We will see you in 2 to 4 weeks to recheck blood pressure and blood work.  You are due for flu, pneumonia, tetanus, shingles, and Covid vaccines.  Please let us know if you would like these administered and we set up appointments to have these administered appropriately.  Definitely come see [place] if you have any kind of penetrating wound to get a tetanus shot at that time.  Complete colon cancer screening with the kit that you received today in the office, and bring back your sample when you get this.  You are up-to-date with other screenings, and will be due for depression and fall screening again in 1 year.  Please follow-up as scheduled in 2 to 4 weeks. We look forward to seeing you again soon!   At [facility] we value your feedback. You may receive a survey about your visit today. Please share your experience as we strive to create trusting relationships with our patients to provide genuine, compassionate, quality care.  We appreciate your understanding and patience as we review any laboratory studies, imaging, and other diagnostic tests that are ordered as we care for you. We do our best to address any and all results in a timely manner. If you do not hear about test results within 1 week, please do not hesitate to contact us. If we referred you to a specialist during your visit or ordered imaging testing, contact the office if you have not been contacted to be scheduled within 1  weeks.  We also encourage the use of MyChart, which contains your medical information for your review as well. If you are not enrolled in this feature, an access code is on this after visit summary for your convenience. Thank you for allowing [place] to be involved in your care.

## 2019-11-16 DIAGNOSIS — Z0000 Encounter for general adult medical examination without abnormal findings: Secondary | ICD-10-CM | POA: Diagnosis not present

## 2019-11-16 DIAGNOSIS — Z1211 Encounter for screening for malignant neoplasm of colon: Secondary | ICD-10-CM | POA: Diagnosis not present

## 2019-11-17 LAB — FECAL GLOBIN BY IMMUNOCHEMISTRY: FECAL GLOBIN RESULT:: NOT DETECTED

## 2019-11-27 ENCOUNTER — Telehealth (INDEPENDENT_AMBULATORY_CARE_PROVIDER_SITE_OTHER): Payer: Self-pay

## 2019-11-27 DIAGNOSIS — I10 Essential (primary) hypertension: Secondary | ICD-10-CM

## 2019-11-27 MED ORDER — CLONIDINE HCL 0.3 MG PO TABS: ORAL_TABLET | ORAL | 0 refills | Status: DC

## 2019-11-27 NOTE — Telephone Encounter (Signed)
Chase Briggs, CMA  

## 2019-12-12 ENCOUNTER — Other Ambulatory Visit: Payer: Self-pay

## 2019-12-12 ENCOUNTER — Ambulatory Visit (INDEPENDENT_AMBULATORY_CARE_PROVIDER_SITE_OTHER): Payer: Medicare Other | Admitting: Nurse Practitioner

## 2019-12-12 ENCOUNTER — Encounter (INDEPENDENT_AMBULATORY_CARE_PROVIDER_SITE_OTHER): Payer: Self-pay | Admitting: Nurse Practitioner

## 2019-12-12 VITALS — BP 140/70 | HR 67 | Temp 97.6°F | Ht 67.0 in | Wt 140.6 lb

## 2019-12-12 DIAGNOSIS — I739 Peripheral vascular disease, unspecified: Secondary | ICD-10-CM | POA: Diagnosis not present

## 2019-12-12 DIAGNOSIS — E782 Mixed hyperlipidemia: Secondary | ICD-10-CM | POA: Diagnosis not present

## 2019-12-12 DIAGNOSIS — I10 Essential (primary) hypertension: Secondary | ICD-10-CM | POA: Diagnosis not present

## 2019-12-12 DIAGNOSIS — G4700 Insomnia, unspecified: Secondary | ICD-10-CM | POA: Insufficient documentation

## 2019-12-12 LAB — LIPID PANEL
Cholesterol: 113 mg/dL (ref <200)
HDL: 43 mg/dL (ref >=40)
LDL Cholesterol (Calc): 56 mg/dL (calc)
Non-HDL Cholesterol (Calc): 70 mg/dL (calc) (ref <130)
Total CHOL/HDL Ratio: 2.6 (calc) (ref <5.0)
Triglycerides: 55 mg/dL (ref <150)

## 2019-12-12 LAB — COMPLETE METABOLIC PANEL WITH GFR
AG Ratio: 1.4 (calc) (ref 1.0–2.5)
ALT: 11 U/L (ref 9–46)
AST: 15 U/L (ref 10–35)
Albumin: 4.4 g/dL (ref 3.6–5.1)
Alkaline phosphatase (APISO): 87 U/L (ref 35–144)
BUN/Creatinine Ratio: 10 (calc) (ref 6–22)
BUN: 14 mg/dL (ref 7–25)
CO2: 28 mmol/L (ref 20–32)
Calcium: 10 mg/dL (ref 8.6–10.3)
Chloride: 102 mmol/L (ref 98–110)
Creat: 1.43 mg/dL — ABNORMAL HIGH (ref 0.70–1.25)
GFR, Est African American: 58 mL/min/{1.73_m2} — ABNORMAL LOW (ref >=60)
GFR, Est Non African American: 50 mL/min/{1.73_m2} — ABNORMAL LOW (ref >=60)
Globulin: 3.2 g/dL (calc) (ref 1.9–3.7)
Glucose, Bld: 60 mg/dL — ABNORMAL LOW (ref 65–99)
Potassium: 4.3 mmol/L (ref 3.5–5.3)
Sodium: 137 mmol/L (ref 135–146)
Total Bilirubin: 0.6 mg/dL (ref 0.2–1.2)
Total Protein: 7.6 g/dL (ref 6.1–8.1)

## 2019-12-12 MED ORDER — ATORVASTATIN CALCIUM 10 MG PO TABS: 10.0000 mg | ORAL_TABLET | Freq: Every day | ORAL | 1 refills | Status: DC

## 2019-12-12 MED ORDER — TRAZODONE HCL 50 MG PO TABS: 50.0000 mg | ORAL_TABLET | Freq: Every day | ORAL | 0 refills | Status: DC

## 2019-12-12 NOTE — Progress Notes (Signed)
Subjective:  Patient ID: Chase Briggs, male    DOB: 09-12-1951  Age: 69 y.o. MRN: [id number]  CC:  Chief Complaint  Patient presents with  . Hypertension  . Medication Refill  . Follow-up      HPI  This patient comes in today for the above.  Hypertension: He has history of hypertension.  His last office visit earlier this month we increase his lisinopril-hydrochlorothiazide to 20-12.5 mg/tab.  He tells me he has been tolerating this medication increase well.  He also continues on his amlodipine and clonidine.  Last office visit his blood pressure was 159/70.  Insomnia: He is requesting a refill on his trazodone for off label treatment of insomnia.  He tells me this medication has been working well for him.  Hyperlipidemia: He has a history of hyperlipidemia.  He continues on aspirin 81 mg and atorvastatin 10 mg.  Last lipid panel was collected in October 2019 and showed total cholesterol 138, HDL 39, LDL of 86, triglycerides of 53.  Healthcare screenings: Of note I see that he did drop off his stool sample for colon cancer screening, the sample was negative for fecal globin.   Past Medical History:  Diagnosis Date  . Diabetes mellitus   . Fatty liver   . GERD (gastroesophageal reflux disease)   . HTN (hypertension)   . Hyperlipidemia   . Insomnia       Family History  Problem Relation Age of Onset  . Diabetes Mother   . Diabetes Father   . Diabetes Brother   . Colon cancer Neg Hx   . 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 Married for 12 years. Has 2 sons (46 and 69). Lives with wife.   Social History   Tobacco Use  . Smoking status: Current Every Day Smoker    Packs/day: 0.50    Years: 25.00    Pack years: 12.50    Types: Cigarettes  . Smokeless tobacco: Never Used  Substance Use Topics  . Alcohol use: No    Comment: hx heavy etoh  x 3 yrs, quit 30+ yrs ago     Current Meds  Medication Sig  . amLODipine (NORVASC) 10 MG tablet TAKE (1) TABLET BY MOUTH ONCE DAILY.  Marland Kitchen aspirin EC 81 MG tablet Take 81 mg by mouth daily.  Marland Kitchen atorvastatin (LIPITOR) 10 MG tablet Take 1 tablet (10 mg total) by mouth daily.  . Cholecalciferol (VITAMIN D) 125 MCG (5000 UT) CAPS Take 5,000 Units by mouth daily.   . cloNIDine (CATAPRES) 0.3 MG tablet TAKE (1) TABLET BY MOUTH AT BEDTIME.  . clopidogrel (PLAVIX) 75 MG tablet TAKE 1 TABLET BY MOUTH DAILY  . Ferrous Sulfate (IRON) 325 (65 Fe) MG TABS Take 325 mg by mouth daily.  Marland Kitchen glucose blood test strip 1 each by Other route 2 (two) times daily. Use as instructed bid E11.65 Relion Premier  . Insulin Glargine (LANTUS) 100 UNIT/ML Solostar Pen Inject 15 Units into the skin at bedtime.  . Lancets MISC 1 each by Does not apply route 2 (two) times daily. E11.65 Relion Premier  . lisinopril-hydrochlorothiazide (ZESTORETIC) 20-12.5 MG tablet Take 1 tablet by mouth daily.  . SURE COMFORT PEN NEEDLES 32G X 4 MM MISC USING ONCE DAILY.  . traZODone (DESYREL) 50 MG tablet Take 1 tablet (50 mg total) by mouth at  bedtime.  . [DISCONTINUED] atorvastatin (LIPITOR) 10 MG tablet Take 1 tablet (10 mg total) by mouth daily.  . [DISCONTINUED] traZODone (DESYREL) 50 MG tablet TAKE ONE TABLET DAILY AT BEDTIME.    ROS:  Review of Systems  Constitutional: Negative for fever and malaise/fatigue.  Eyes: Negative for blurred vision and double vision.  Respiratory: Negative for cough, shortness of breath and wheezing.   Cardiovascular: Negative for chest pain and palpitations.  Neurological: Negative for dizziness and headaches.  Endo/Heme/Allergies: Negative for polydipsia.     Objective:   Today's Vitals: BP 140/70 (BP Location: Left Arm, Patient Position: Sitting, Cuff Size: Normal)   Pulse 67   Temp 97.6 F (36.4 C) (Temporal)   Ht '5\' 7"'$  (1.702 m)   Wt 140 lb 9.6 oz (63.8 kg)   SpO2 96%   BMI 22.02 kg/m   Vitals with BMI 12/12/2019 11/14/2019 08/15/2019  Height '5\' 7"'$  5' 7.5" 5' 7.5"  Weight 140 lbs 10 oz 144 lbs 10 oz 143 lbs  BMI 22.02 24.0 97.35  Systolic 329 924 268  Diastolic 70 70 73  Pulse 67 67 93     Physical Exam Vitals reviewed.  Constitutional:      Appearance: Normal appearance.  HENT:     Head: Normocephalic and atraumatic.  Cardiovascular:     Rate and Rhythm: Normal rate and regular rhythm.     Pulses:          Dorsalis pedis pulses are 2+ on the right side and 2+ on the left side.  Pulmonary:     Effort: Pulmonary effort is normal.     Breath sounds: Normal breath sounds.  Musculoskeletal:     Cervical back: Neck supple.     Right foot: No deformity.     Left foot: No deformity.  Feet:     Right foot:     Protective Sensation: 10 sites tested. 10 sites sensed.     Skin integrity: Skin integrity normal.     Toenail Condition: Right toenails are normal.     Left foot:     Protective Sensation: 10 sites tested. 9 sites sensed.     Skin integrity: Skin integrity normal.     Toenail Condition: Left toenails are normal.  Skin:    General: Skin is warm and dry.  Neurological:     Mental Status: He is alert and oriented to person, place, and time.  Psychiatric:        Mood and Affect: Mood normal.        Behavior: Behavior normal.        Thought Content: Thought content normal.        Judgment: Judgment normal.          Assessment and Plan   1. Essential hypertension   2. PAD (peripheral artery disease) ([place])   3. Insomnia, unspecified type   4. Mixed hyperlipidemia      Plan: 1.  Blood pressure is borderline today, but is much better.  He is tolerating all doses of his antihypertensives currently.  We will continue on current doses and check metabolic panel for further evaluation today.  2.,  4.  I will check lipid panel today for further evaluation.  I have also refilled his atorvastatin.  Further recommendations may be made based upon his lab  results.  3.  I have refilled his trazodone, he will continue on this for his off label treatment of insomnia.  He will be due for colon cancer  screening via fecal immunoglobulin testing in March 2022   Tests ordered Orders Placed This Encounter  Procedures  . CMP with eGFR([facility])  . Lipid Panel      Meds ordered this encounter  Medications  . traZODone (DESYREL) 50 MG tablet    Sig: Take 1 tablet (50 mg total) by mouth at bedtime.    Dispense:  90 tablet    Refill:  0    Order Specific Question:   Supervising Provider    Answer:   Hurshel Party C [[id number]]  . atorvastatin (LIPITOR) 10 MG tablet    Sig: Take 1 tablet (10 mg total) by mouth daily.    Dispense:  30 tablet    Refill:  1    Please refill with primary care Dr. Anastasio Champion.    Order Specific Question:   Supervising Provider    Answer:   Doree Albee [[id number]]    Patient to follow-up in 3 months with Dr. Anastasio Champion, or sooner as needed.  Ailene Ards, NP

## 2019-12-14 ENCOUNTER — Ambulatory Visit: Payer: Medicare Other | Admitting: "Endocrinology

## 2019-12-20 ENCOUNTER — Encounter: Payer: Self-pay | Admitting: "Endocrinology

## 2019-12-20 ENCOUNTER — Other Ambulatory Visit: Payer: Self-pay

## 2019-12-20 ENCOUNTER — Ambulatory Visit (INDEPENDENT_AMBULATORY_CARE_PROVIDER_SITE_OTHER): Payer: Medicare Other | Admitting: "Endocrinology

## 2019-12-20 VITALS — BP 137/79 | HR 49 | Ht 67.0 in | Wt 141.6 lb

## 2019-12-20 DIAGNOSIS — E1122 Type 2 diabetes mellitus with diabetic chronic kidney disease: Secondary | ICD-10-CM | POA: Diagnosis not present

## 2019-12-20 DIAGNOSIS — N183 Chronic kidney disease, stage 3 unspecified: Secondary | ICD-10-CM | POA: Diagnosis not present

## 2019-12-20 DIAGNOSIS — F17200 Nicotine dependence, unspecified, uncomplicated: Secondary | ICD-10-CM | POA: Diagnosis not present

## 2019-12-20 DIAGNOSIS — E782 Mixed hyperlipidemia: Secondary | ICD-10-CM

## 2019-12-20 DIAGNOSIS — IMO0002 Reserved for concepts with insufficient information to code with codable children: Secondary | ICD-10-CM

## 2019-12-20 DIAGNOSIS — E1165 Type 2 diabetes mellitus with hyperglycemia: Secondary | ICD-10-CM | POA: Diagnosis not present

## 2019-12-20 DIAGNOSIS — I10 Essential (primary) hypertension: Secondary | ICD-10-CM | POA: Diagnosis not present

## 2019-12-20 LAB — POCT GLYCOSYLATED HEMOGLOBIN (HGB A1C): Hemoglobin A1C: 6.2 % — AB (ref 4.0–5.6)

## 2019-12-20 MED ORDER — INSULIN GLARGINE 100 UNIT/ML SOLOSTAR PEN: 10.0000 [IU] | PEN_INJECTOR | Freq: Every day | SUBCUTANEOUS | 1 refills | Status: DC

## 2019-12-20 NOTE — Progress Notes (Signed)
12/20/2019   Endocrinology follow-up note    Subjective:    Patient ID: Chase Briggs, male    DOB: 10/11/1950, PCP [name], MD   Past Medical History:  Diagnosis Date  . Diabetes mellitus   . Fatty liver   . GERD (gastroesophageal reflux disease)   . HTN (hypertension)   . Hyperlipidemia   . Insomnia    Past Surgical History:  Procedure Laterality Date  . ABDOMINAL AORTOGRAM W/LOWER EXTREMITY N/A 08/22/2018   Procedure: ABDOMINAL AORTOGRAM W/LOWER EXTREMITY;  Surgeon: [name], MD;  Location: [facility] INVASIVE CV LAB;  Service: Cardiovascular;  Laterality: N/A;  . BACK SURGERY    . CARPAL TUNNEL WITH CUBITAL TUNNEL Right 03/21/2019   Procedure: RIGHT CARPAL TUNNEL AND CUBITAL  TUNNEL RELEASES;  Surgeon: [name], MD;  Location: [facility];  Service: Orthopedics;  Laterality: Right;  AXILLARY BLOCK  . COLONOSCOPY  01/2007   Dr. [name], reviewed report, mild diverticulosis. Prep adequate. Next TCS due 01/2017  . ESOPHAGOGASTRODUODENOSCOPY N/A 10/21/2012   POE:UMPNTIRW GASTRITIS  . MASS EXCISION Right 10/26/2012   Procedure: EXCISION MASS;  Surgeon: [name], MD;  Location: [facility];  Service: General;  Laterality: Right;  . PERIPHERAL VASCULAR INTERVENTION  08/22/2018   Procedure: PERIPHERAL VASCULAR INTERVENTION;  Surgeon: [name], MD;  Location: [facility] INVASIVE CV LAB;  Service: Cardiovascular;;  bilateral external iliac stents  . scalp mass  4-5 yrs ago   Dr [name]  . ULNAR NERVE TRANSPOSITION Right 03/21/2019   Procedure: DECOMPRESSION RIGHT ULNAR NERVE;  Surgeon: [name], MD;  Location: [facility];  Service: Orthopedics;  Laterality: Right;   Social History   Socioeconomic History  . Marital status: Married    Spouse name: Not on file  . Number of children: 1  . Years of education: Not on file  . Highest education level: Not on file  Occupational History  . Occupation: reitred    Comment:  [employer], heavy lifting  Tobacco Use  . Smoking status: Current Every Day Smoker    Packs/day: 0.50    Years: 25.00    Pack years: 12.50    Types: Cigarettes  . Smokeless tobacco: Never Used  Substance and Sexual Activity  . Alcohol use: No    Comment: hx heavy etoh x 3 yrs, quit 30+ yrs ago  . Drug use: No  . Sexual activity: Yes    Birth control/protection: None  Other Topics Concern  . 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 Highest level of education: 12th grade. Married for 12 years. Has 2 sons (33 and 21). Lives with wife.   Social Determinants of Health   Financial Resource Strain:   . Difficulty of Paying Living Expenses:   Food Insecurity:   . Worried About Programme researcher, broadcasting/film/video in the Last Year:   . Barista in the Last Year:   Transportation Needs:   . Freight forwarder (Medical):   Marland Kitchen Lack of Transportation (Non-Medical):   Physical Activity:   . Days of Exercise per Week:   . Minutes of Exercise per Session:   Stress:   . Feeling of Stress :   Social Connections:   . Frequency of Communication with Friends and Family:   . Frequency of Social Gatherings with Friends and Family:   . Attends Religious Services:   . Active Member of Clubs or Organizations:   .  Attends Banker Meetings:   Marland Kitchen Marital Status:    Outpatient Encounter Medications as of 12/20/2019  Medication Sig  . amLODipine (NORVASC) 10 MG tablet TAKE (1) TABLET BY MOUTH ONCE DAILY.  Marland Kitchen aspirin EC 81 MG tablet Take 81 mg by mouth daily.  Marland Kitchen atorvastatin (LIPITOR) 10 MG tablet Take 1 tablet (10 mg total) by mouth daily.  . Cholecalciferol (VITAMIN D) 125 MCG (5000 UT) CAPS Take 5,000 Units by mouth daily.   . cloNIDine (CATAPRES) 0.3 MG tablet TAKE (1) TABLET BY MOUTH AT BEDTIME.  . clopidogrel (PLAVIX) 75 MG tablet TAKE 1 TABLET BY MOUTH DAILY  . Ferrous Sulfate (IRON) 325 (65 Fe) MG TABS Take 325 mg by mouth daily.  Marland Kitchen glucose blood test  strip 1 each by Other route 2 (two) times daily. Use as instructed bid E11.65 Relion Premier  . insulin glargine (LANTUS) 100 UNIT/ML Solostar Pen Inject 10 Units into the skin at bedtime.  . Lancets MISC 1 each by Does not apply route 2 (two) times daily. E11.65 Relion Premier  . lisinopril-hydrochlorothiazide (ZESTORETIC) 20-12.5 MG tablet Take 1 tablet by mouth daily.  . SURE COMFORT PEN NEEDLES 32G X 4 MM MISC USING ONCE DAILY.  . traZODone (DESYREL) 50 MG tablet Take 1 tablet (50 mg total) by mouth at bedtime.  . [DISCONTINUED] amLODipine (NORVASC) 10 MG tablet Take 1 tablet (10 mg total) by mouth daily.  . [DISCONTINUED] cloNIDine (CATAPRES) 0.3 MG tablet Take 0.3 mg by mouth at bedtime.  . [DISCONTINUED] Insulin Glargine (LANTUS) 100 UNIT/ML Solostar Pen Inject 15 Units into the skin at bedtime.  . [DISCONTINUED] traZODone (DESYREL) 50 MG tablet Take 50 mg by mouth at bedtime.   No facility-administered encounter medications on file as of 12/20/2019.   ALLERGIES: No Known Allergies VACCINATION STATUS:  There is no immunization history on file for this patient.  Diabetes He presents for his follow-up diabetic visit. He has type 2 diabetes mellitus. Onset time: He was diagnosed at approximate age of 35 years. His disease course has been improving. There are no hypoglycemic associated symptoms. Pertinent negatives for hypoglycemia include no confusion, headaches, pallor or seizures. Pertinent negatives for diabetes include no chest pain, no fatigue, no polydipsia, no polyphagia, no polyuria and no weakness. There are no hypoglycemic complications. Symptoms are improving (He recently lost control of his glycemia due to exposure to heavy dose steroids.). Risk factors for coronary artery disease include diabetes mellitus, dyslipidemia, hypertension, male sex, sedentary lifestyle and tobacco exposure. He is compliant with treatment most of the time. His weight is fluctuating minimally. He is  following a generally unhealthy diet. When asked about meal planning, he reported none. He has not had a previous visit with a dietitian. His home blood glucose trend is decreasing steadily. His breakfast blood glucose range is generally 90-110 mg/dl. His bedtime blood glucose range is generally 130-140 mg/dl. His overall blood glucose range is 130-140 mg/dl. An ACE inhibitor/angiotensin II receptor blocker is being taken. Eye exam is current.  Hypertension This is a chronic problem. The current episode started more than 1 year ago. The problem has been gradually improving since onset. The problem is uncontrolled. Pertinent negatives include no chest pain, headaches, neck pain, palpitations or shortness of breath. Risk factors for coronary artery disease include smoking/tobacco exposure, sedentary lifestyle, dyslipidemia, diabetes mellitus and family history. Past treatments include ACE inhibitors, calcium channel blockers and central alpha agonists. The current treatment provides no improvement. Compliance problems include psychosocial issues.  Hyperlipidemia This is a chronic problem. The current episode started more than 1 year ago. Exacerbating diseases include diabetes. Pertinent negatives include no chest pain, myalgias or shortness of breath. Current antihyperlipidemic treatment includes statins. Risk factors for coronary artery disease include dyslipidemia, diabetes mellitus, male sex, hypertension and a sedentary lifestyle.     Review of Systems  Constitutional: Negative for fatigue and unexpected weight change.  HENT: Negative for dental problem, mouth sores and trouble swallowing.   Eyes: Negative for visual disturbance.  Respiratory: Negative for cough, choking, chest tightness, shortness of breath and wheezing.   Cardiovascular: Negative for chest pain, palpitations and leg swelling.  Gastrointestinal: Negative for abdominal distention, abdominal pain, constipation, diarrhea, nausea and  vomiting.  Endocrine: Negative for polydipsia, polyphagia and polyuria.  Genitourinary: Negative for dysuria, flank pain, hematuria and urgency.  Musculoskeletal: Negative for back pain, gait problem, myalgias and neck pain.  Skin: Negative for pallor, rash and wound.  Neurological: Negative for seizures, syncope, weakness, numbness and headaches.  Psychiatric/Behavioral: Negative.  Negative for confusion and dysphoric mood.    Objective:    BP 137/79   Pulse (!) 49   Ht 5\' 7"  (1.702 m)   Wt 141 lb 9.6 oz (64.2 kg)   BMI 22.18 kg/m   Wt Readings from Last 3 Encounters:  12/20/19 141 lb 9.6 oz (64.2 kg)  12/12/19 140 lb 9.6 oz (63.8 kg)  11/14/19 144 lb 9.6 oz (65.6 kg)     Physical Exam- Limited  Constitutional:  Body mass index is 22.18 kg/m. , not in acute distress, normal state of mind Eyes:  EOMI, no exophthalmos Neck: Supple Thyroid: No gross goiter Respiratory: Adequate breathing efforts Musculoskeletal: no gross deformities, strength intact in all four extremities, no gross restriction of joint movements Skin:  no rashes, no hyperemia Neurological: no tremor with outstretched hands,   Complete Blood Count (Most recent): Lab Results  Component Value Date   WBC 7.2 09/16/2018   HGB 12.8 (L) 09/16/2018   HCT 39.5 09/16/2018   MCV 94.5 09/16/2018   PLT 202 09/16/2018   Chemistry (most recent): Lab Results  Component Value Date   NA 137 12/12/2019   K 4.3 12/12/2019   CL 102 12/12/2019   CO2 28 12/12/2019   BUN 14 12/12/2019   CREATININE 1.43 (H) 12/12/2019   Diabetic Labs (most recent): Lab Results  Component Value Date   HGBA1C 6.2 (A) 12/20/2019   HGBA1C 6.4 (A) 08/15/2019   HGBA1C 6.1 (H) 05/04/2019   Lipid Panel     Component Value Date/Time   CHOL 113 12/12/2019 0905   TRIG 55 12/12/2019 0905   HDL 43 12/12/2019 0905   CHOLHDL 2.6 12/12/2019 0905   LDLCALC 56 12/12/2019 0905     Assessment & Plan:   1. Type 2 diabetes mellitus without  complication, without long-term current use of insulin (HCC)  -He returns with tightly controlled fasting glycemic profile, on target postprandial blood glucose readings.  His point-of-care A1c 6.2%, overall improving from 10.8%.   He does have mild, random, rare fasting hypoglycemia.  He is a chronic heavy smoker, remains at a high risk for more acute and chronic complications of diabetes which include CAD, CVA, CKD, retinopathy, and neuropathy. These are all discussed in detail with the patient.    Recent labs reviewed, stage 3 renal insufficiency. - I have re-counseled the patient on diet management  by adopting a carbohydrate restricted / protein rich  Diet.  - he  admits there  is a room for improvement in his diet and drink choices. -  Suggestion is made for him to avoid simple carbohydrates  from his diet including Cakes, Sweet Desserts / Pastries, Ice Cream, Soda (diet and regular), Sweet Tea, Candies, Chips, Cookies, Sweet Pastries,  Store Bought Juices, Alcohol in Excess of  1-2 drinks a day, Artificial Sweeteners, Coffee Creamer, and "Sugar-free" Products. This will help patient to have stable blood glucose profile and potentially avoid unintended weight gain.   - Patient is advised to stick to a routine mealtimes to eat 3 meals  a day and avoid unnecessary snacks ( to snack only to correct hypoglycemia).  - I have approached patient with the following individualized plan to manage diabetes and patient agrees.  - He has benefited from insulin initiation.   - Based on  his presentation with tightly controlled glycemic profile both fasting and postprandial, he will not require prandial insulin for now.  He is advised to lower his Lantus to 10 units nightly,  continue monitoring blood glucose at least twice a day-daily before breakfast and at bedtime.     - He will call clinic if he registers blood glucose readings less than 70 or greater than 200 mg/dL.  -   He has stage 3 renal  insufficiency which has improved over time, will stay off of metformin for now. If his renal insufficiency continues, he will need consultation with nephrology.    - Patient is not a candidate for  incretin therapy, nor SGLT2 inhibitors. - Patient specific target  for A1c; LDL, HDL, Triglycerides, were discussed in detail.  2) BP/HTN: His blood pressure is controlled to target.    He has 3 medications including losartan, clonidine, and amlodipine.  He continues to smoke heavily.  He is advised to be consistent taking his medications.  He has responded to the addition of clonidine. The patient was counseled on the dangers of tobacco use, and was advised to quit.  Reviewed strategies to maximize success, including removing cigarettes and smoking materials from environment.   3) Lipids/HPL: Recent lipid panel showed LDL of 45.  He is not on statins at the moment.   4)  Weight/Diet: His BMI is 22.  He is not a candidate for weight loss.  He has maintained his gain of 20 pounds of weight.   - He would benefit from addition of more complex carbohydrates, information provided. CDE consult is requested.  5. Vitamin D deficiency  - He he is on ongoing supplement with vitamin D3 5000 units daily.    6) Chronic Care/Health Maintenance:  -Patient is on ACEI/ARB and Statin medications and encouraged to continue to follow up with Ophthalmology, Podiatrist at least yearly or according to recommendations, and advised to  Quit  smoking. I have recommended yearly flu vaccine and pneumonia vaccination at least every 5 years; moderate intensity exercise for up to 150 minutes weekly; and  sleep for at least 7 hours a day. -He is counseled extensively for smoking cessation.  - I advised patient to maintain close follow up with [name], MD for primary care needs. - I have advised him to avoid over-the-counter pain medications to avoid additional risk for renal insufficiency.  - Time spent on this  patient care encounter:  35 min, of which > 50% was spent in  counseling and the rest reviewing his blood glucose logs , discussing his hypoglycemia and hyperglycemia episodes, reviewing his current and  previous labs / studies  (  including abstraction from other facilities) and medications  doses and developing a  long term treatment plan and documenting his care.   Please refer to Patient Instructions for Blood Glucose Monitoring and Insulin/Medications Dosing Guide"  in media tab for additional information. Please  also refer to " Patient Self Inventory" in the Media  tab for reviewed elements of pertinent patient history.  Tomie China Iannuzzi participated in the discussions, expressed understanding, and voiced agreement with the above plans.  All questions were answered to his satisfaction. he is encouraged to contact clinic should he have any questions or concerns prior to his return visit.  Follow up plan: -Return in about 4 months (around 04/20/2020) for Bring Meter and Logs- A1c in Office.  Glade Lloyd, MD Phone: [phone]  Fax: [phone]  This note was partially dictated with voice recognition software. Similar sounding words can be transcribed inadequately or may not  be corrected upon review.  12/20/2019, 12:31 PM

## 2019-12-26 ENCOUNTER — Telehealth: Payer: Self-pay | Admitting: "Endocrinology

## 2019-12-26 MED ORDER — SURE COMFORT PEN NEEDLES 32G X 4 MM MISC: 1 refills | Status: DC

## 2019-12-26 NOTE — Telephone Encounter (Signed)
Pt needs refill on SURE COMFORT PEN NEEDLES 32G X 4 MM MISC. [facility]

## 2019-12-26 NOTE — Telephone Encounter (Signed)
Rx refill sent to [facility]. 

## 2019-12-29 ENCOUNTER — Other Ambulatory Visit (INDEPENDENT_AMBULATORY_CARE_PROVIDER_SITE_OTHER): Payer: Self-pay | Admitting: Internal Medicine

## 2020-01-04 ENCOUNTER — Ambulatory Visit: Payer: Medicare Other | Attending: Internal Medicine

## 2020-01-04 DIAGNOSIS — Z23 Encounter for immunization: Secondary | ICD-10-CM

## 2020-01-04 NOTE — Progress Notes (Signed)
   Covid-19 Vaccination Clinic  Name:  Chase Briggs    MRN: [id number] DOB: 1951-03-10  01/04/2020  Mr. Chase Briggs was observed post Covid-19 immunization for 15 minutes without incident. He was provided with Vaccine Information Sheet and instruction to access the V-Safe system.   Chase Briggs was instructed to call 911 with any severe reactions post vaccine: Marland Kitchen Difficulty breathing  . Swelling of face and throat  . A fast heartbeat  . A bad rash all over body  . Dizziness and weakness   Immunizations Administered    Name Date Dose VIS Date Route   Moderna COVID-19 Vaccine 01/04/2020  8:22 AM 0.5 mL 08/2019 Intramuscular   Manufacturer: Moderna   Lot: [id number]   NDC: 77939-030-09

## 2020-01-09 ENCOUNTER — Other Ambulatory Visit: Payer: Self-pay

## 2020-01-09 ENCOUNTER — Other Ambulatory Visit (INDEPENDENT_AMBULATORY_CARE_PROVIDER_SITE_OTHER): Payer: Self-pay | Admitting: Nurse Practitioner

## 2020-01-09 DIAGNOSIS — I739 Peripheral vascular disease, unspecified: Secondary | ICD-10-CM

## 2020-01-09 MED ORDER — SURE COMFORT PEN NEEDLES 32G X 4 MM MISC: 3 refills | Status: DC

## 2020-02-06 ENCOUNTER — Ambulatory Visit: Payer: Medicare Other

## 2020-02-06 ENCOUNTER — Ambulatory Visit: Payer: Medicare Other | Attending: Internal Medicine

## 2020-02-06 ENCOUNTER — Other Ambulatory Visit: Payer: Self-pay

## 2020-02-06 DIAGNOSIS — Z23 Encounter for immunization: Secondary | ICD-10-CM

## 2020-02-06 NOTE — Progress Notes (Signed)
   Covid-19 Vaccination Clinic  Name:  Chase Briggs    MRN: [id number] DOB: 1950-12-31  02/06/2020  [name] was observed post Covid-19 immunization for 15 minutes without incident. He was provided with Vaccine Information Sheet and instruction to access the V-Safe system.   [name] was instructed to call 911 with any severe reactions post vaccine: Marland Kitchen Difficulty breathing  . Swelling of face and throat  . A fast heartbeat  . A bad rash all over body  . Dizziness and weakness   Immunizations Administered    Name Date Dose VIS Date Route   Moderna COVID-19 Vaccine 02/06/2020  8:54 AM 0.5 mL 08/2019 Intramuscular   Manufacturer: Moderna   Lot: [id number]   NDC: 56314-970-26

## 2020-02-21 ENCOUNTER — Other Ambulatory Visit (INDEPENDENT_AMBULATORY_CARE_PROVIDER_SITE_OTHER): Payer: Self-pay | Admitting: Internal Medicine

## 2020-02-21 DIAGNOSIS — I10 Essential (primary) hypertension: Secondary | ICD-10-CM

## 2020-02-22 ENCOUNTER — Ambulatory Visit (INDEPENDENT_AMBULATORY_CARE_PROVIDER_SITE_OTHER): Payer: Medicare Other | Admitting: Internal Medicine

## 2020-02-22 ENCOUNTER — Ambulatory Visit (HOSPITAL_COMMUNITY)
Admission: RE | Admit: 2020-02-22 | Discharge: 2020-02-22 | Disposition: A | Discharge status: Home / Self Care | Payer: Medicare Other | Source: Ambulatory Visit | Attending: Internal Medicine | Admitting: Internal Medicine

## 2020-02-22 ENCOUNTER — Encounter (INDEPENDENT_AMBULATORY_CARE_PROVIDER_SITE_OTHER): Payer: Self-pay | Admitting: Internal Medicine

## 2020-02-22 ENCOUNTER — Other Ambulatory Visit (HOSPITAL_COMMUNITY): Payer: Self-pay | Admitting: Internal Medicine

## 2020-02-22 ENCOUNTER — Other Ambulatory Visit: Payer: Self-pay

## 2020-02-22 VITALS — BP 155/60 | HR 95 | Temp 97.7°F | Ht 67.0 in | Wt 138.2 lb

## 2020-02-22 DIAGNOSIS — M25521 Pain in right elbow: Secondary | ICD-10-CM | POA: Diagnosis not present

## 2020-02-22 DIAGNOSIS — M25421 Effusion, right elbow: Secondary | ICD-10-CM | POA: Diagnosis not present

## 2020-02-22 DIAGNOSIS — M7989 Other specified soft tissue disorders: Secondary | ICD-10-CM | POA: Diagnosis not present

## 2020-02-22 DIAGNOSIS — R2231 Localized swelling, mass and lump, right upper limb: Secondary | ICD-10-CM

## 2020-02-22 NOTE — Progress Notes (Signed)
Metrics: Intervention Frequency ACO  Documented Smoking Status Yearly  Screened one or more times in 24 months  Cessation Counseling or  Active cessation medication Past 24 months  Past 24 months   Guideline developer: UpToDate (See UpToDate for funding source) Date Released: 2014       Wellness Office Visit  Subjective:  Patient ID: Chase Briggs, male    DOB: 1951-04-10  Age: 69 y.o. MRN: [id number]  CC: Right elbow area/right upper arm mass/lump. HPI This man comes in for an acute visit with the above complaint which he noticed about 3 to 4 days ago.  It does not cause him any pain.  He showed his wife who was then concerned also.  He comes in now to make sure there is nothing serious going on.  The lump/mass was only noted 3 to 4 days ago so he does not think it is increased in size and it certainly is not painful.  There is no history of trauma.  Past Medical History:  Diagnosis Date  . Diabetes mellitus   . Fatty liver   . GERD (gastroesophageal reflux disease)   . HTN (hypertension)   . Hyperlipidemia   . Insomnia    Past Surgical History:  Procedure Laterality Date  . ABDOMINAL AORTOGRAM W/LOWER EXTREMITY N/A 08/22/2018   Procedure: ABDOMINAL AORTOGRAM W/LOWER EXTREMITY;  Surgeon: Waynetta Sandy, MD;  Location: [facility];  Service: Cardiovascular;  Laterality: N/A;  . BACK SURGERY    . CARPAL TUNNEL WITH CUBITAL TUNNEL Right 03/21/2019   Procedure: RIGHT CARPAL TUNNEL AND CUBITAL  TUNNEL RELEASES;  Surgeon: Daryll Brod, MD;  Location: [place];  Service: Orthopedics;  Laterality: Right;  AXILLARY BLOCK  . COLONOSCOPY  01/2007   Dr. Arnoldo Morale, reviewed report, mild diverticulosis. Prep adequate. Next TCS due 01/2017  . ESOPHAGOGASTRODUODENOSCOPY N/A 10/21/2012   [id number] GASTRITIS  . MASS EXCISION Right 10/26/2012   Procedure: EXCISION MASS;  Surgeon: Jamesetta So, MD;  Location: [facility];  Service: General;  Laterality: Right;  .  PERIPHERAL VASCULAR INTERVENTION  08/22/2018   Procedure: PERIPHERAL VASCULAR INTERVENTION;  Surgeon: Waynetta Sandy, MD;  Location: [facility];  Service: Cardiovascular;;  bilateral external iliac stents  . scalp mass  4-5 yrs ago   Dr Arnoldo Morale  . ULNAR NERVE TRANSPOSITION Right 03/21/2019   Procedure: DECOMPRESSION RIGHT ULNAR NERVE;  Surgeon: Daryll Brod, MD;  Location: [place];  Service: Orthopedics;  Laterality: Right;     Family History  Problem Relation Age of Onset  . Diabetes Mother   . Diabetes Father   . Diabetes Brother   . Colon cancer Neg Hx   . 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 Married for 12 years. Has 2 sons (63 and 1). Lives with wife.   Social History   Tobacco Use  . Smoking status: Current Every Day Smoker    Packs/day: 0.50    Years: 25.00    Pack years: 12.50    Types: Cigarettes  . Smokeless tobacco: Never Used  Substance Use Topics  . 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 amLODipine (NORVASC) 10 MG tablet TAKE (1) TABLET BY MOUTH ONCE DAILY.  Marland Kitchen aspirin EC 81  MG tablet Take 81 mg by mouth daily.  Marland Kitchen atorvastatin (LIPITOR) 10 MG tablet TAKE ONE TABLET BY MOUTH ONCE DAILY.  Marland Kitchen Cholecalciferol (VITAMIN D) 125 MCG (5000 UT) CAPS Take 5,000 Units by mouth daily.   . cloNIDine (CATAPRES) 0.3 MG tablet TAKE (1) TABLET BY MOUTH AT BEDTIME.  . clopidogrel (PLAVIX) 75 MG tablet TAKE (1) TABLET BY MOUTH ONCE DAILY.  Marland Kitchen Ferrous Sulfate (IRON) 325 (65 Fe) MG TABS Take 325 mg by mouth daily.  Marland Kitchen glucose blood test strip 1 each by Other route 2 (two) times daily. Use as instructed bid E11.65 Relion Premier  . insulin glargine (LANTUS) 100 UNIT/ML Solostar Pen Inject 10 Units into the skin at bedtime.  . Insulin Pen Needle (SURE COMFORT PEN NEEDLES) 32G X  4 MM MISC USING ONCE DAILY.  Marland Kitchen Lancets MISC 1 each by Does not apply route 2 (two) times daily. E11.65 Relion Premier  . lisinopril-hydrochlorothiazide (ZESTORETIC) 20-12.5 MG tablet Take 1 tablet by mouth daily.  . traZODone (DESYREL) 50 MG tablet Take 1 tablet (50 mg total) by mouth at bedtime.      Depression screen [facility] 2/9 12/12/2019 11/14/2019 08/31/2017 05/31/2017 02/25/2017  Decreased Interest 0 0 0 0 0  Down, Depressed, Hopeless 0 0 0 0 0  PHQ - 2 Score 0 0 0 0 0     Objective:   Today's Vitals: BP (!) 155/60 (BP Location: Left Arm, Patient Position: Sitting, Cuff Size: Normal)   Pulse 95   Temp 97.7 F (36.5 C) (Temporal)   Ht 5\' 7"  (1.702 m)   Wt 138 lb 3.2 oz (62.7 kg)   SpO2 96%   BMI 21.65 kg/m  Vitals with BMI 02/22/2020 12/20/2019 12/12/2019  Height 5\' 7"  5\' 7"  5\' 7"   Weight 138 lbs 3 oz 141 lbs 10 oz 140 lbs 10 oz  BMI 21.64 22.17 22.02  Systolic 155 137 12/14/2019  Diastolic 60 79 70  Pulse 95 49 67     Physical Exam  He looks systemically well.  He is not in pain.  Systolic blood pressure elevated.  He appears to have bony prominence in the right ulnar bone.  I do not think there is a cystic lesion here.  I am not quite convinced of a mass but it is possible.     Assessment   1. Forearm mass, right       Tests ordered Orders Placed This Encounter  Procedures  . DG Elbow Complete Right     Plan: 1. I will refer him for an x-ray in the right elbow area to further evaluate.  I will let him know the results.   No orders of the defined types were placed in this encounter.   , MD

## 2020-02-26 NOTE — Progress Notes (Signed)
Please call the patient and tell him that the abnormality that he felt is a bony spur.  If this is causing him discomfort or pain, I can refer him to orthopedics.  Otherwise, we will just continue to monitor.  Let me know what he wants to do.

## 2020-02-26 NOTE — Progress Notes (Signed)
Called;lvm to call office back.

## 2020-03-11 ENCOUNTER — Other Ambulatory Visit: Payer: Self-pay

## 2020-03-11 ENCOUNTER — Other Ambulatory Visit (INDEPENDENT_AMBULATORY_CARE_PROVIDER_SITE_OTHER): Payer: Self-pay | Admitting: Internal Medicine

## 2020-03-11 ENCOUNTER — Other Ambulatory Visit (INDEPENDENT_AMBULATORY_CARE_PROVIDER_SITE_OTHER): Payer: Self-pay | Admitting: Nurse Practitioner

## 2020-03-11 ENCOUNTER — Encounter (INDEPENDENT_AMBULATORY_CARE_PROVIDER_SITE_OTHER): Payer: Self-pay | Admitting: Internal Medicine

## 2020-03-11 ENCOUNTER — Ambulatory Visit (INDEPENDENT_AMBULATORY_CARE_PROVIDER_SITE_OTHER): Payer: Medicare Other | Admitting: Internal Medicine

## 2020-03-11 VITALS — BP 160/65 | HR 65 | Temp 97.7°F | Ht 67.0 in | Wt 135.6 lb

## 2020-03-11 DIAGNOSIS — G4700 Insomnia, unspecified: Secondary | ICD-10-CM

## 2020-03-11 DIAGNOSIS — N489 Disorder of penis, unspecified: Secondary | ICD-10-CM

## 2020-03-11 DIAGNOSIS — I739 Peripheral vascular disease, unspecified: Secondary | ICD-10-CM

## 2020-03-11 MED ORDER — CLOTRIMAZOLE-BETAMETHASONE 1-0.05 % EX CREA: 1.0000 "application " | TOPICAL_CREAM | Freq: Two times a day (BID) | CUTANEOUS | 0 refills | Status: DC

## 2020-03-11 NOTE — Progress Notes (Signed)
Metrics: Intervention Frequency ACO  Documented Smoking Status Yearly  Screened one or more times in 24 months  Cessation Counseling or  Active cessation medication Past 24 months  Past 24 months   Guideline developer: UpToDate (See UpToDate for funding source) Date Released: 2014       Wellness Office Visit  Subjective:  Patient ID: Chase Briggs, male    DOB: 05-01-1951  Age: 69 y.o. MRN: [id number]  CC: Penile lesion HPI This man comes in as an acute visit with a 1 week history of a penile lesion on the dorsal aspect of his penis.  He has had this before a couple of years ago and was treated with a combination of steroid/antifungal cream.  He has tried to put the same cream he was prescribed a couple years ago on the lesion without improvement.  The lesion is not painful and less it rubs up against clothing.  He denies any trauma.  He is diabetic.  Past Medical History:  Diagnosis Date  . Diabetes mellitus   . Fatty liver   . GERD (gastroesophageal reflux disease)   . HTN (hypertension)   . Hyperlipidemia   . Insomnia    Past Surgical History:  Procedure Laterality Date  . ABDOMINAL AORTOGRAM W/LOWER EXTREMITY N/A 08/22/2018   Procedure: ABDOMINAL AORTOGRAM W/LOWER EXTREMITY;  Surgeon: [name], MD;  Location: [facility];  Service: Cardiovascular;  Laterality: N/A;  . BACK SURGERY    . CARPAL TUNNEL WITH CUBITAL TUNNEL Right 03/21/2019   Procedure: RIGHT CARPAL TUNNEL AND CUBITAL  TUNNEL RELEASES;  Surgeon: [name], MD;  Location: [facility];  Service: Orthopedics;  Laterality: Right;  AXILLARY BLOCK  . COLONOSCOPY  01/2007   Dr. [name], reviewed report, mild diverticulosis. Prep adequate. Next TCS due 01/2017  . ESOPHAGOGASTRODUODENOSCOPY N/A 10/21/2012   [id number] GASTRITIS  . MASS EXCISION Right 10/26/2012   Procedure: EXCISION MASS;  Surgeon: [name], MD;  Location: [facility];  Service: General;  Laterality: Right;  .  PERIPHERAL VASCULAR INTERVENTION  08/22/2018   Procedure: PERIPHERAL VASCULAR INTERVENTION;  Surgeon: [name], MD;  Location: [facility];  Service: Cardiovascular;;  bilateral external iliac stents  . scalp mass  4-5 yrs ago   Dr [name]  . ULNAR NERVE TRANSPOSITION Right 03/21/2019   Procedure: DECOMPRESSION RIGHT ULNAR NERVE;  Surgeon: [name], MD;  Location: [facility];  Service: Orthopedics;  Laterality: Right;     Family History  Problem Relation Age of Onset  . Diabetes Mother   . Diabetes Father   . Diabetes Brother   . Colon cancer Neg Hx   . 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 Married for 12 years. Has 2 sons (33 and 21). Lives with wife.   Social History   Tobacco Use  . Smoking status: Current Every Day Smoker    Packs/day: 0.50    Years: 25.00    Pack years: 12.50    Types: Cigarettes  . Smokeless tobacco: Never Used  Substance Use Topics  . 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 amLODipine (NORVASC) 10 MG tablet TAKE (1) TABLET BY MOUTH ONCE DAILY.  Marland Kitchen aspirin EC 81 MG tablet Take 81  mg by mouth daily.  Marland Kitchen atorvastatin (LIPITOR) 10 MG tablet TAKE ONE TABLET BY MOUTH ONCE DAILY.  Marland Kitchen Cholecalciferol (VITAMIN D) 125 MCG (5000 UT) CAPS Take 5,000 Units by mouth daily.   . cloNIDine (CATAPRES) 0.3 MG tablet TAKE (1) TABLET BY MOUTH AT BEDTIME.  . clopidogrel (PLAVIX) 75 MG tablet TAKE (1) TABLET BY MOUTH ONCE DAILY.  Marland Kitchen Ferrous Sulfate (IRON) 325 (65 Fe) MG TABS Take 325 mg by mouth daily.  Marland Kitchen glucose blood test strip 1 each by Other route 2 (two) times daily. Use as instructed bid E11.65 Relion Premier  . insulin glargine (LANTUS) 100 UNIT/ML Solostar Pen Inject 10 Units into the skin at bedtime.  . Insulin Pen Needle (SURE COMFORT PEN NEEDLES) 32G X  4 MM MISC USING ONCE DAILY.  Marland Kitchen Lancets MISC 1 each by Does not apply route 2 (two) times daily. E11.65 Relion Premier  . lisinopril-hydrochlorothiazide (ZESTORETIC) 20-12.5 MG tablet Take 1 tablet by mouth daily.  . traZODone (DESYREL) 50 MG tablet TAKE ONE TABLET DAILY AT BEDTIME.       Depression screen [facility] 2/9 12/12/2019 11/14/2019 08/31/2017 05/31/2017 02/25/2017  Decreased Interest 0 0 0 0 0  Down, Depressed, Hopeless 0 0 0 0 0  PHQ - 2 Score 0 0 0 0 0     Objective:   Today's Vitals: BP (!) 160/65 (BP Location: Left Arm, Patient Position: Sitting, Cuff Size: Normal)   Pulse 65   Temp 97.7 F (36.5 C) (Temporal)   Ht 5\' 7"  (1.702 m)   Wt 135 lb 9.6 oz (61.5 kg)   SpO2 97%   BMI 21.24 kg/m  Vitals with BMI 03/11/2020 02/22/2020 12/20/2019  Height 5\' 7"  5\' 7"  5\' 7"   Weight 135 lbs 10 oz 138 lbs 3 oz 141 lbs 10 oz  BMI 21.23 84.13 24.40  Systolic 102 725 366  Diastolic 65 60 79  Pulse 65 95 49     Physical Exam  There is a pinkish appearing lesion on the dorsal aspect of his penis, possibly fungal in nature, could represent balanitis.     Assessment   1. Lesion of penis       Tests ordered Orders Placed This Encounter  Procedures  . Ambulatory referral to Dermatology     Plan: 1. I am going to treat him empirically with Lotrisone cream but also will refer him to dermatology for further evaluation.   Meds ordered this encounter  Medications  . clotrimazole-betamethasone (LOTRISONE) cream    Sig: Apply 1 application topically 2 (two) times daily.    Dispense:  30 g    Refill:  0    London Tarnowski Luther Parody, MD

## 2020-03-14 ENCOUNTER — Ambulatory Visit (INDEPENDENT_AMBULATORY_CARE_PROVIDER_SITE_OTHER): Payer: Medicare Other | Admitting: Internal Medicine

## 2020-03-21 ENCOUNTER — Ambulatory Visit (INDEPENDENT_AMBULATORY_CARE_PROVIDER_SITE_OTHER): Payer: Medicare Other | Admitting: Nurse Practitioner

## 2020-03-21 ENCOUNTER — Encounter (INDEPENDENT_AMBULATORY_CARE_PROVIDER_SITE_OTHER): Payer: Self-pay | Admitting: Nurse Practitioner

## 2020-03-21 ENCOUNTER — Other Ambulatory Visit: Payer: Self-pay

## 2020-03-21 VITALS — BP 140/70 | HR 102 | Temp 97.7°F | Ht 67.0 in | Wt 133.0 lb

## 2020-03-21 DIAGNOSIS — L0232 Furuncle of buttock: Secondary | ICD-10-CM

## 2020-03-21 MED ORDER — CEPHALEXIN 500 MG PO CAPS: 500.0000 mg | ORAL_CAPSULE | Freq: Three times a day (TID) | ORAL | 0 refills | Status: DC

## 2020-03-21 NOTE — Progress Notes (Signed)
Subjective:  Patient ID: Chase Briggs, male    DOB: [birth year]  Age: 69 y.o. MRN: [id number]  CC:  Chief Complaint  Patient presents with  . Recurrent Skin Infections    on Bottom      HPI  This patient arrives today for acute visit for the above.  He tells me 3 days ago he started feeling a painful bump forming to the left lower buttock/groin area.  He tells me the pain has intensified over the 3 days and now he tells me it is a constant achy and throbbing pain that he rates as a 9 out of 10.  He has tried warm compresses which has not improved his pain.  He has had abscesses in the past and tells me that the symptoms remind him of previous infections.  He tells me walking or sitting worsens the pain.  He is having a hard time sleeping.    Past Medical History:  Diagnosis Date  . Diabetes mellitus   . Fatty liver   . GERD (gastroesophageal reflux disease)   . HTN (hypertension)   . Hyperlipidemia   . Insomnia       Family History  Problem Relation Age of Onset  . Diabetes Mother   . Diabetes Father   . Diabetes Brother   . Colon cancer Neg Hx   . 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 Married for 12 years. Has 2 sons (33 and 21). Lives with wife.   Social History   Tobacco Use  . Smoking status: Current Every Day Smoker    Packs/day: 0.50    Years: 25.00    Pack years: 12.50    Types: Cigarettes  . Smokeless tobacco: Never Used  Substance Use Topics  . Alcohol use: No    Comment: hx heavy etoh x 3 yrs, quit 30+ yrs ago     Current Meds  Medication Sig  . amLODipine (NORVASC) 10 MG tablet TAKE (1) TABLET BY MOUTH ONCE DAILY.  Marland Kitchen aspirin EC 81 MG tablet Take 81 mg by mouth daily.  Marland Kitchen atorvastatin (LIPITOR) 10 MG tablet TAKE ONE TABLET BY MOUTH ONCE DAILY.  Marland Kitchen Cholecalciferol (VITAMIN D) 125 MCG (5000 UT) CAPS Take 5,000 Units by  mouth daily.   . cloNIDine (CATAPRES) 0.3 MG tablet TAKE (1) TABLET BY MOUTH AT BEDTIME.  . clopidogrel (PLAVIX) 75 MG tablet TAKE (1) TABLET BY MOUTH ONCE DAILY.  . clotrimazole-betamethasone (LOTRISONE) cream Apply 1 application topically 2 (two) times daily.  . Ferrous Sulfate (IRON) 325 (65 Fe) MG TABS Take 325 mg by mouth daily.  Marland Kitchen glucose blood test strip 1 each by Other route 2 (two) times daily. Use as instructed bid E11.65 Relion Premier  . insulin glargine (LANTUS) 100 UNIT/ML Solostar Pen Inject 10 Units into the skin at bedtime.  . Insulin Pen Needle (SURE COMFORT PEN NEEDLES) 32G X 4 MM MISC USING ONCE DAILY.  Marland Kitchen Lancets MISC 1 each by Does not apply route 2 (two) times daily. E11.65 Relion Premier  . lisinopril-hydrochlorothiazide (ZESTORETIC) 20-12.5 MG tablet Take 1 tablet by mouth daily.  . traZODone (DESYREL) 50 MG tablet TAKE ONE TABLET DAILY AT BEDTIME.    ROS:  Review of Systems  Constitutional: Negative for chills, fever and malaise/fatigue.  Skin:       (+) boil on bottom (-)  drainage     Objective:   Today's Vitals: BP 140/70 (BP Location: Left Arm, Patient Position: Standing, Cuff Size: Normal)   Pulse (!) 102   Temp 97.7 F (36.5 C) (Temporal)   Ht 5\' 7"  (1.702 m)   Wt 133 lb (60.3 kg)   SpO2 97%   BMI 20.83 kg/m  Vitals with BMI 03/21/2020 03/11/2020 02/22/2020  Height 5\' 7"  5\' 7"  5\' 7"   Weight 133 lbs 135 lbs 10 oz 138 lbs 3 oz  BMI 20.83 21.23 21.64  Systolic 140 160 04/23/2020  Diastolic 70 65 60  Pulse 102 65 95     Physical Exam Vitals reviewed.  Constitutional:      Appearance: Normal appearance.  HENT:     Head: Normocephalic and atraumatic.  Skin:    General: Skin is warm and dry.       Neurological:     Mental Status: He is alert and oriented to person, place, and time.  Psychiatric:        Mood and Affect: Mood normal.        Behavior: Behavior normal.        Thought Content: Thought content normal.        Judgment: Judgment normal.            Assessment and Plan   1. Boil of buttock      Plan: 1.  Lesion on his bottom is consistent with furuncle.  Will prescribe him antibiotics today for treatment.  Review of metabolic panel shows that he does have some mild renal dysfunction creatinine clearance is approximately 46 mL/min based on last metabolic panel.  He denies any personal history of MRSA or recent hospitalizations.  We will treat him with Keflex 500 mg 3 times daily.  I have encouraged him to also continue using warm compresses and consider using sits baths for symptom relief.  I was not able to consider lancing the boil as we do not have equipment in this office to perform this procedure.  I did tell him that if his symptoms persist, worsen, or do not improve at all within the next 2 days that he should follow-up at urgent care or emergency department.   Tests ordered No orders of the defined types were placed in this encounter.     Meds ordered this encounter  Medications  . cephALEXin (KEFLEX) 500 MG capsule    Sig: Take 1 capsule (500 mg total) by mouth 3 (three) times daily.    Dispense:  30 capsule    Refill:  0    Order Specific Question:   Supervising Provider    Answer:   [[id number]]    Patient to follow-up as scheduled, or sooner as needed.  , NP

## 2020-03-25 ENCOUNTER — Other Ambulatory Visit: Payer: Self-pay | Admitting: "Endocrinology

## 2020-03-28 ENCOUNTER — Encounter (INDEPENDENT_AMBULATORY_CARE_PROVIDER_SITE_OTHER): Payer: Self-pay | Admitting: Internal Medicine

## 2020-03-28 ENCOUNTER — Ambulatory Visit (INDEPENDENT_AMBULATORY_CARE_PROVIDER_SITE_OTHER): Payer: Medicare Other | Admitting: Internal Medicine

## 2020-03-28 ENCOUNTER — Other Ambulatory Visit (INDEPENDENT_AMBULATORY_CARE_PROVIDER_SITE_OTHER): Payer: Self-pay | Admitting: Internal Medicine

## 2020-03-28 ENCOUNTER — Telehealth (INDEPENDENT_AMBULATORY_CARE_PROVIDER_SITE_OTHER): Payer: Self-pay | Admitting: Internal Medicine

## 2020-03-28 ENCOUNTER — Other Ambulatory Visit: Payer: Self-pay

## 2020-03-28 VITALS — BP 140/60 | HR 70 | Temp 98.0°F | Ht 67.0 in | Wt 135.4 lb

## 2020-03-28 DIAGNOSIS — Z125 Encounter for screening for malignant neoplasm of prostate: Secondary | ICD-10-CM | POA: Diagnosis not present

## 2020-03-28 DIAGNOSIS — E782 Mixed hyperlipidemia: Secondary | ICD-10-CM

## 2020-03-28 DIAGNOSIS — E559 Vitamin D deficiency, unspecified: Secondary | ICD-10-CM

## 2020-03-28 DIAGNOSIS — I10 Essential (primary) hypertension: Secondary | ICD-10-CM

## 2020-03-28 MED ORDER — CLOPIDOGREL BISULFATE 75 MG PO TABS: ORAL_TABLET | ORAL | 1 refills | Status: DC

## 2020-03-28 MED ORDER — AMLODIPINE BESYLATE 10 MG PO TABS: ORAL_TABLET | ORAL | 1 refills | Status: DC

## 2020-03-28 MED ORDER — CLONIDINE HCL 0.3 MG PO TABS: 0.3000 mg | ORAL_TABLET | Freq: Every day | ORAL | 1 refills | Status: DC

## 2020-03-28 NOTE — Progress Notes (Signed)
Metrics: Intervention Frequency ACO  Documented Smoking Status Yearly  Screened one or more times in 24 months  Cessation Counseling or  Active cessation medication Past 24 months  Past 24 months   Guideline developer: UpToDate (See UpToDate for funding source) Date Released: 2014       Wellness Office Visit  Subjective:  Patient ID: Chase Briggs, male    DOB: 1951-04-09  Age: 69 y.o. MRN: [id number]  CC: This man comes in for follow-up of hypertension, hyperlipidemia, vitamin D deficiency. HPI  He sees Dr. Fransico Him for his diabetes and is on insulin.  He is due to see Dr. Fransico Him in about a month's time. He continues on amlodipine, Zestoretic for his hypertension as well as clonidine. He also continues on atorvastatin for his hyperlipidemia.  He is tolerating all these medications well. He has been taking vitamin D3 5000 units daily for vitamin D deficiency and his levels previously were in a good range. He tells me today that he has no appetite since he has been on antibiotics more recently and has had infections. Past Medical History:  Diagnosis Date  . Diabetes mellitus   . Fatty liver   . GERD (gastroesophageal reflux disease)   . HTN (hypertension)   . Hyperlipidemia   . Insomnia    Past Surgical History:  Procedure Laterality Date  . ABDOMINAL AORTOGRAM W/LOWER EXTREMITY N/A 08/22/2018   Procedure: ABDOMINAL AORTOGRAM W/LOWER EXTREMITY;  Surgeon: [name], MD;  Location: [facility];  Service: Cardiovascular;  Laterality: N/A;  . BACK SURGERY    . CARPAL TUNNEL WITH CUBITAL TUNNEL Right 03/21/2019   Procedure: RIGHT CARPAL TUNNEL AND CUBITAL  TUNNEL RELEASES;  Surgeon: [name], MD;  Location: [facility];  Service: Orthopedics;  Laterality: Right;  AXILLARY BLOCK  . COLONOSCOPY  01/2007   Dr. [name], reviewed report, mild diverticulosis. Prep adequate. Next TCS due 01/2017  . ESOPHAGOGASTRODUODENOSCOPY N/A 10/21/2012   [id number]  GASTRITIS  . MASS EXCISION Right 10/26/2012   Procedure: EXCISION MASS;  Surgeon: [name], MD;  Location[facility];  Service: General;  Laterality: Right;  . PERIPHERAL VASCULAR INTERVENTION  08/22/2018   Procedure: PERIPHERAL VASCULAR INTERVENTION;  Surgeon: [name], MD;  Location: [facility];  Service: Cardiovascular;;  bilateral external iliac stents  . scalp mass  4-5 yrs ago   Dr [name]  . ULNAR NERVE TRANSPOSITION Right 03/21/2019   Procedure: DECOMPRESSION RIGHT ULNAR NERVE;  Surgeon: [name], MD;  Location: [facility];  Service: Orthopedics;  Laterality: Right;     Family History  Problem Relation Age of Onset  . Diabetes Mother   . Diabetes Father   . Diabetes Brother   . Colon cancer Neg Hx   . 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 Married for 12 years. Has 2 sons (33 and 21). Lives with wife.   Social History   Tobacco Use  . Smoking status: Current Every Day Smoker    Packs/day: 0.50    Years: 25.00    Pack years: 12.50    Types: Cigarettes  . Smokeless tobacco: Never Used  Substance Use Topics  . 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 amLODipine (NORVASC) 10  MG tablet TAKE (1) TABLET BY MOUTH ONCE DAILY.  Marland Kitchen aspirin EC 81 MG tablet Take 81 mg by mouth daily.  Marland Kitchen atorvastatin (LIPITOR) 10 MG tablet TAKE ONE TABLET BY MOUTH ONCE DAILY.  . cephALEXin (KEFLEX) 500 MG capsule Take 1 capsule (500 mg total) by mouth 3 (three) times daily.  . Cholecalciferol (VITAMIN D) 125 MCG (5000 UT) CAPS Take 5,000 Units by mouth daily.   . cloNIDine (CATAPRES) 0.3 MG tablet TAKE (1) TABLET BY MOUTH AT BEDTIME.  . clopidogrel (PLAVIX) 75 MG tablet TAKE (1) TABLET BY MOUTH ONCE DAILY.  . clotrimazole-betamethasone (LOTRISONE) cream Apply 1 application  topically 2 (two) times daily.  . Ferrous Sulfate (IRON) 325 (65 Fe) MG TABS Take 325 mg by mouth daily.  Marland Kitchen glucose blood test strip 1 each by Other route 2 (two) times daily. Use as instructed bid E11.65 Relion Premier  . insulin glargine (LANTUS SOLOSTAR) 100 UNIT/ML Solostar Pen Inject 10 Units into the skin at bedtime.  . Insulin Pen Needle (SURE COMFORT PEN NEEDLES) 32G X 4 MM MISC USING ONCE DAILY.  Marland Kitchen Lancets MISC 1 each by Does not apply route 2 (two) times daily. E11.65 Relion Premier  . lisinopril-hydrochlorothiazide (ZESTORETIC) 20-12.5 MG tablet Take 1 tablet by mouth daily.  . traZODone (DESYREL) 50 MG tablet TAKE ONE TABLET DAILY AT BEDTIME.       Depression screen [facility] 2/9 12/12/2019 11/14/2019 08/31/2017 05/31/2017 02/25/2017  Decreased Interest 0 0 0 0 0  Down, Depressed, Hopeless 0 0 0 0 0  PHQ - 2 Score 0 0 0 0 0     Objective:   Today's Vitals: BP 140/60 (BP Location: Left Arm, Patient Position: Sitting, Cuff Size: Normal)   Pulse 70   Temp 98 F (36.7 C) (Temporal)   Ht 5\' 7"  (1.702 m)   Wt 135 lb 6.4 oz (61.4 kg)   SpO2 95%   BMI 21.21 kg/m  Vitals with BMI 03/28/2020 03/21/2020 03/11/2020  Height 5\' 7"  5\' 7"  5\' 7"   Weight 135 lbs 6 oz 133 lbs 135 lbs 10 oz  BMI 21.2 20.83 21.23  Systolic 140 140 03/13/2020  Diastolic 60 70 65  Pulse 70 102 65     Physical Exam   He looks systemically well.  His weight is stable.  Blood pressure is reasonably well controlled.    Assessment   1. Essential hypertension   2. Mixed hyperlipidemia   3. Vitamin D deficiency   4. Special screening for malignant neoplasm of prostate       Tests ordered Orders Placed This Encounter  Procedures  . COMPLETE METABOLIC PANEL WITH GFR  . PSA, Total with Reflex to PSA, Free     Plan: 1. He will continue with clonidine, amlodipine and Zestoretic for his hypertension which seems to be controlling his blood pressure reasonably well. 2. He will continue on atorvastatin for his  hyperlipidemia and his last lipid panel was in a good range. 3. He will continue with vitamin D3 supplementation for vitamin D deficiency and his levels were in a good range last time. 4. I will check his renal function today to make sure it is stable. 5. Follow-up in about 4 months time.   No orders of the defined types were placed in this encounter.   , MD

## 2020-03-28 NOTE — Telephone Encounter (Signed)
Okay, I have just sent it now.

## 2020-03-29 LAB — COMPLETE METABOLIC PANEL WITH GFR
AG Ratio: 1.3 (calc) (ref 1.0–2.5)
ALT: 14 U/L (ref 9–46)
AST: 17 U/L (ref 10–35)
Albumin: 4.1 g/dL (ref 3.6–5.1)
Alkaline phosphatase (APISO): 83 U/L (ref 35–144)
BUN/Creatinine Ratio: 10 (calc) (ref 6–22)
BUN: 13 mg/dL (ref 7–25)
CO2: 27 mmol/L (ref 20–32)
Calcium: 9.4 mg/dL (ref 8.6–10.3)
Chloride: 100 mmol/L (ref 98–110)
Creat: 1.34 mg/dL — ABNORMAL HIGH (ref 0.70–1.25)
GFR, Est African American: 63 mL/min/{1.73_m2} (ref >=60)
GFR, Est Non African American: 54 mL/min/{1.73_m2} — ABNORMAL LOW (ref >=60)
Globulin: 3.1 g/dL (calc) (ref 1.9–3.7)
Glucose, Bld: 168 mg/dL — ABNORMAL HIGH (ref 65–99)
Potassium: 4.1 mmol/L (ref 3.5–5.3)
Sodium: 133 mmol/L — ABNORMAL LOW (ref 135–146)
Total Bilirubin: 0.6 mg/dL (ref 0.2–1.2)
Total Protein: 7.2 g/dL (ref 6.1–8.1)

## 2020-03-29 LAB — PSA, TOTAL WITH REFLEX TO PSA, FREE: PSA, Total: 1.4 ng/mL (ref <=4.0)

## 2020-04-23 ENCOUNTER — Encounter: Payer: Self-pay | Admitting: Nurse Practitioner

## 2020-04-23 ENCOUNTER — Other Ambulatory Visit: Payer: Self-pay

## 2020-04-23 ENCOUNTER — Ambulatory Visit (INDEPENDENT_AMBULATORY_CARE_PROVIDER_SITE_OTHER): Payer: Medicare Other | Admitting: Nurse Practitioner

## 2020-04-23 VITALS — BP 139/66 | HR 74 | Ht 67.0 in | Wt 133.4 lb

## 2020-04-23 DIAGNOSIS — E1165 Type 2 diabetes mellitus with hyperglycemia: Secondary | ICD-10-CM

## 2020-04-23 DIAGNOSIS — F17200 Nicotine dependence, unspecified, uncomplicated: Secondary | ICD-10-CM | POA: Diagnosis not present

## 2020-04-23 DIAGNOSIS — I10 Essential (primary) hypertension: Secondary | ICD-10-CM

## 2020-04-23 DIAGNOSIS — E782 Mixed hyperlipidemia: Secondary | ICD-10-CM

## 2020-04-23 DIAGNOSIS — N182 Chronic kidney disease, stage 2 (mild): Secondary | ICD-10-CM | POA: Diagnosis not present

## 2020-04-23 DIAGNOSIS — IMO0002 Reserved for concepts with insufficient information to code with codable children: Secondary | ICD-10-CM

## 2020-04-23 DIAGNOSIS — Z794 Long term (current) use of insulin: Secondary | ICD-10-CM | POA: Diagnosis not present

## 2020-04-23 DIAGNOSIS — E1122 Type 2 diabetes mellitus with diabetic chronic kidney disease: Secondary | ICD-10-CM

## 2020-04-23 LAB — POCT GLYCOSYLATED HEMOGLOBIN (HGB A1C): Hemoglobin A1C: 6.8 % — AB (ref 4.0–5.6)

## 2020-04-23 MED ORDER — LANTUS SOLOSTAR 100 UNIT/ML ~~LOC~~ SOPN: 10.0000 [IU] | PEN_INJECTOR | Freq: Every day | SUBCUTANEOUS | 2 refills | Status: DC

## 2020-04-23 NOTE — Patient Instructions (Signed)

## 2020-04-23 NOTE — Progress Notes (Signed)
04/23/2020   Endocrinology follow-up note    Subjective:    Patient ID: Chase Briggs, male    DOB: [birth year], PCP Wilson SingerGosrani, Nimish C, MD   Past Medical History:  Diagnosis Date  . Diabetes mellitus   . Fatty liver   . GERD (gastroesophageal reflux disease)   . HTN (hypertension)   . Hyperlipidemia   . Insomnia    Past Surgical History:  Procedure Laterality Date  . ABDOMINAL AORTOGRAM W/LOWER EXTREMITY N/A 08/22/2018   Procedure: ABDOMINAL AORTOGRAM W/LOWER EXTREMITY;  Surgeon: Maeola Harmanain, Brandon Christopher, MD;  [facility];  Service: Cardiovascular;  Laterality: N/A;  . BACK SURGERY    . CARPAL TUNNEL WITH CUBITAL TUNNEL Right 03/21/2019   Procedure: RIGHT CARPAL TUNNEL AND CUBITAL  TUNNEL RELEASES;  Surgeon: Cindee SaltKuzma, Gary, MD;  Location: [facility];  Service: Orthopedics;  Laterality: Right;  AXILLARY BLOCK  . COLONOSCOPY  01/2007   Dr. Lovell SheehanJenkins, reviewed report, mild diverticulosis. Prep adequate. Next TCS due 01/2017  . ESOPHAGOGASTRODUODENOSCOPY N/A 10/21/2012   HYQ:[id number]:MODERATE GASTRITIS  . MASS EXCISION Right 10/26/2012   Procedure: EXCISION MASS;  Surgeon: Dalia HeadingMark A Jenkins, MD;  Location: [facility];  Service: General;  Laterality: Right;  . PERIPHERAL VASCULAR INTERVENTION  08/22/2018   Procedure: PERIPHERAL VASCULAR INTERVENTION;  Surgeon: Maeola Harmanain, Brandon Christopher, MD;  [facility];  Service: Cardiovascular;;  bilateral external iliac stents  . scalp mass  4-5 yrs ago   Dr Lovell SheehanJenkins  . ULNAR NERVE TRANSPOSITION Right 03/21/2019   Procedure: DECOMPRESSION RIGHT ULNAR NERVE;  Surgeon: Cindee SaltKuzma, Gary, MD;  Location: [facility];  Service: Orthopedics;  Laterality: Right;   Social History   Socioeconomic History  . Marital status: Married    Spouse name: Not on file  . Number of children: 1  . Years of education: Not on file  . Highest education level: Not on file  Occupational History  . Occupation: reitred    Comment:  [employer], heavy lifting  Tobacco Use  . Smoking status: Current Every Day Smoker    Packs/day: 0.50    Years: 25.00    Pack years: 12.50    Types: Cigarettes  . Smokeless tobacco: Never Used  Vaping Use  . Vaping Use: Never used  Substance and Sexual Activity  . Alcohol use: No    Comment: hx heavy etoh x 3 yrs, quit 30+ yrs ago  . Drug use: No  . Sexual activity: Yes    Birth control/protection: None  Other Topics Concern  . 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 Highest level of education: 12th grade. Married for 12 years. Has 2 sons (33 and 21). Lives with wife.   Social Determinants of Health   Financial Resource Strain:   . Difficulty of Paying Living Expenses:   Food Insecurity:   . Worried About Programme researcher, broadcasting/film/videounning Out of Food in the Last Year:   . Baristaan Out of Food in the Last Year:   Transportation Needs:   . Freight forwarderLack of Transportation (Medical):   Marland Kitchen. Lack of Transportation (Non-Medical):   Physical Activity:   . Days of Exercise per Week:   . Minutes of Exercise per Session:   Stress:   . Feeling of Stress :   Social Connections:   . Frequency of Communication with Friends and Family:   . Frequency of Social Gatherings with Friends and Family:   . Attends Religious Services:   .  Active Member of Clubs or Organizations:   . Attends Banker Meetings:   Marland Kitchen Marital Status:    Outpatient Encounter Medications as of 04/23/2020  Medication Sig  . amLODipine (NORVASC) 10 MG tablet TAKE (1) TABLET BY MOUTH ONCE DAILY.  Marland Kitchen aspirin EC 81 MG tablet Take 81 mg by mouth daily.  Marland Kitchen atorvastatin (LIPITOR) 10 MG tablet TAKE ONE TABLET BY MOUTH ONCE DAILY.  Marland Kitchen Cholecalciferol (VITAMIN D) 125 MCG (5000 UT) CAPS Take 5,000 Units by mouth daily.   . clopidogrel (PLAVIX) 75 MG tablet TAKE (1) TABLET BY MOUTH ONCE DAILY.  . clotrimazole-betamethasone (LOTRISONE) cream Apply 1 application topically 2 (two) times daily.  . Ferrous Sulfate (IRON) 325 (65  Fe) MG TABS Take 325 mg by mouth daily.  Marland Kitchen glucose blood test strip 1 each by Other route 2 (two) times daily. Use as instructed bid E11.65 Relion Premier  . insulin glargine (LANTUS SOLOSTAR) 100 UNIT/ML Solostar Pen Inject 10 Units into the skin at bedtime.  . Insulin Pen Needle (SURE COMFORT PEN NEEDLES) 32G X 4 MM MISC USING ONCE DAILY.  Marland Kitchen Lancets MISC 1 each by Does not apply route 2 (two) times daily. E11.65 Relion Premier  . lisinopril-hydrochlorothiazide (ZESTORETIC) 20-12.5 MG tablet Take 1 tablet by mouth daily.  . traZODone (DESYREL) 50 MG tablet TAKE ONE TABLET DAILY AT BEDTIME.  . [DISCONTINUED] amLODipine (NORVASC) 10 MG tablet Take 1 tablet (10 mg total) by mouth daily.  . [DISCONTINUED] cephALEXin (KEFLEX) 500 MG capsule Take 1 capsule (500 mg total) by mouth 3 (three) times daily.  . [DISCONTINUED] cloNIDine (CATAPRES) 0.3 MG tablet Take 0.3 mg by mouth at bedtime.  . [DISCONTINUED] cloNIDine (CATAPRES) 0.3 MG tablet Take 1 tablet (0.3 mg total) by mouth daily.  . [DISCONTINUED] traZODone (DESYREL) 50 MG tablet Take 50 mg by mouth at bedtime.   No facility-administered encounter medications on file as of 04/23/2020.   ALLERGIES: No Known Allergies VACCINATION STATUS: Immunization History  Administered Date(s) Administered  . Moderna SARS-COVID-2 Vaccination 01/04/2020, 02/06/2020    Diabetes He presents for his follow-up diabetic visit. He has type 2 diabetes mellitus. Onset time: He was diagnosed at approximate age of 35 years. His disease course has been improving. There are no hypoglycemic associated symptoms. Pertinent negatives for hypoglycemia include no pallor. There are no diabetic associated symptoms. Pertinent negatives for diabetes include no chest pain, no fatigue, no polydipsia, no polyphagia and no polyuria. There are no hypoglycemic complications. Symptoms are improving (He recently lost control of his glycemia due to exposure to heavy dose steroids.). Diabetic  complications include nephropathy. Risk factors for coronary artery disease include diabetes mellitus, dyslipidemia, hypertension, male sex, sedentary lifestyle and tobacco exposure. Current diabetic treatment includes insulin injections. He is compliant with treatment most of the time. His weight is fluctuating minimally. He is following a generally unhealthy diet. When asked about meal planning, he reported none. He has not had a previous visit with a dietitian. He rarely participates in exercise. His home blood glucose trend is fluctuating minimally. His breakfast blood glucose range is generally 70-90 mg/dl. His bedtime blood glucose range is generally 140-180 mg/dl. (He presents today with his logs and meter showing near target fasting and postprandial glycemic profile with frequent episodes of fasting hypoglycemia.  He denies any symptoms with his hypoglycemia.  His point-of-care A1c today is 6.8%, slightly increased from last visits A1c of 6.2%.  He says he finds himself eating snacks at bedtime to avoid fasting hypoglycemia.)  An ACE inhibitor/angiotensin II receptor blocker is being taken. He does not see a podiatrist.Eye exam is current.  Hypertension This is a chronic problem. The current episode started more than 1 year ago. The problem has been gradually improving since onset. The problem is controlled. Pertinent negatives include no chest pain, palpitations or shortness of breath. There are no associated agents to hypertension. Risk factors for coronary artery disease include smoking/tobacco exposure, sedentary lifestyle, dyslipidemia, diabetes mellitus and family history. Past treatments include ACE inhibitors, calcium channel blockers and angiotensin blockers. The current treatment provides moderate improvement. Compliance problems include psychosocial issues and diet.  Hypertensive end-organ damage includes kidney disease. Identifiable causes of hypertension include chronic renal disease.   Hyperlipidemia This is a chronic problem. The current episode started more than 1 year ago. The problem is controlled. Recent lipid tests were reviewed and are normal. Exacerbating diseases include chronic renal disease and diabetes. Factors aggravating his hyperlipidemia include smoking and fatty foods. Pertinent negatives include no chest pain, myalgias or shortness of breath. Current antihyperlipidemic treatment includes statins. The current treatment provides moderate improvement of lipids. Compliance problems include adherence to diet.  Risk factors for coronary artery disease include dyslipidemia, diabetes mellitus, male sex, hypertension and a sedentary lifestyle.     Review of Systems  Constitutional: Positive for appetite change. Negative for fatigue and unexpected weight change.  Respiratory: Positive for cough. Negative for chest tightness, shortness of breath and wheezing.        R/t smoking  Cardiovascular: Negative for chest pain, palpitations and leg swelling.  Gastrointestinal: Negative for constipation and diarrhea.  Endocrine: Negative for polydipsia, polyphagia and polyuria.  Musculoskeletal: Negative for arthralgias and myalgias.  Skin: Negative for pallor, rash and wound.  Psychiatric/Behavioral: Negative.     Objective:    BP 139/66 (BP Location: Right Arm, Patient Position: Sitting)   Pulse 74   Ht 5\' 7"  (1.702 m)   Wt 133 lb 6.4 oz (60.5 kg)   BMI 20.89 kg/m   Wt Readings from Last 3 Encounters:  04/23/20 133 lb 6.4 oz (60.5 kg)  03/28/20 135 lb 6.4 oz (61.4 kg)  03/21/20 133 lb (60.3 kg)    BP Readings from Last 3 Encounters:  04/23/20 139/66  03/28/20 140/60  03/21/20 140/70     Physical Exam- Limited  Constitutional:  Body mass index is 20.89 kg/m. , not in acute distress, normal state of mind Eyes:  EOMI, no exophthalmos Neck: Supple Thyroid: No gross goiter Cardiovascular: RRR, no murmers, rubs, or gallops, no edema Respiratory: Adequate  breathing efforts, no crackles, rales, rhonchi, + mild wheezing Musculoskeletal: no gross deformities, strength intact in all four extremities, no gross restriction of joint movements Skin:  no rashes, no hyperemia, + moderate clubbing of fingernails Neurological: no tremor with outstretched hands   Complete Blood Count (Most recent): Lab Results  Component Value Date   WBC 7.2 09/16/2018   HGB 12.8 (L) 09/16/2018   HCT 39.5 09/16/2018   MCV 94.5 09/16/2018   PLT 202 09/16/2018   Chemistry (most recent): Lab Results  Component Value Date   NA 133 (L) 03/28/2020   K 4.1 03/28/2020   CL 100 03/28/2020   CO2 27 03/28/2020   BUN 13 03/28/2020   CREATININE 1.34 (H) 03/28/2020   Diabetic Labs (most recent): Lab Results  Component Value Date   HGBA1C 6.2 (A) 12/20/2019   HGBA1C 6.4 (A) 08/15/2019   HGBA1C 6.1 (H) 05/04/2019   Lipid Panel  Component Value Date/Time   CHOL 113 12/12/2019 0905   TRIG 55 12/12/2019 0905   HDL 43 12/12/2019 0905   CHOLHDL 2.6 12/12/2019 0905   LDLCALC 56 12/12/2019 0905     Assessment & Plan:   1. Type 2 diabetes mellitus without complication, without long-term current use of insulin (HCC)  He presents today with his logs and meter showing near target fasting and postprandial glycemic profile with frequent episodes of fasting hypoglycemia.  He denies any symptoms with his hypoglycemia.  His point-of-care A1c today is 6.8%, slightly increased from last visits A1c of 6.2%.  He says he finds himself eating snacks at bedtime to avoid fasting hypoglycemia.  He is a chronic heavy smoker, remains at a high risk for more acute and chronic complications of diabetes which include CAD, CVA, CKD, retinopathy, and neuropathy. These are all discussed in detail with the patient.    Recent labs reviewed, stage 2 renal insufficiency, some improvement in kidney function.  - The patient admits there is a room for improvement in their diet and drink  choices. -  Suggestion is made for the patient to avoid simple carbohydrates from their diet including Cakes, Sweet Desserts / Pastries, Ice Cream, Soda (diet and regular), Sweet Tea, Candies, Chips, Cookies, Sweet Pastries,  Store Bought Juices, Alcohol in Excess of  1-2 drinks a day, Artificial Sweeteners, Coffee Creamer, and "Sugar-free" Products. This will help patient to have stable blood glucose profile and potentially avoid unintended weight gain.   - I encouraged the patient to switch to  unprocessed or minimally processed complex starch and increased protein intake (animal or plant source), fruits, and vegetables.   - Patient is advised to stick to a routine mealtimes to eat 3 meals  a day and avoid unnecessary snacks ( to snack only to correct hypoglycemia).   - I have approached patient with the following individualized plan to manage diabetes and patient agrees.  - He has benefited from insulin initiation.   - Based on  his presentation with tightly controlled glycemic profile both fasting and postprandial, he will not require prandial insulin for now.  However given his frequent fasting hypoglycemia, will change timing of the Lantus 10 units SQ to be administered at breakfast rather than at bedtime.  He loses control of his diabetes when taken off insulin, therefore will benefit from continuation of current therapy.   - He is instructed to continue testing blood glucose twice a day, before breakfast and before bed.  He is instructed to call the clinic if his blood glucose is less than 70 or above 200 for 3 tests in a row.   -   He has stage 2 renal insufficiency which has improved over time, will stay off of metformin for now.   - Patient is not a candidate for  incretin therapy, nor SGLT2 inhibitors. - Patient specific target  for A1c; LDL, HDL, Triglycerides, were discussed in detail.  2) BP/HTN: His blood pressure is controlled to target.  He is advised to continue amlodipine 10  mg p.o. daily, clonidine 0.3 mg p.o. daily, and lisinopril-HCT 20--12.5 mg p.o. daily. He is strongly advised to quit smoking, but is hesitant to do so.    3) Lipids/HPL:  Recent lipid panel shows controlled LDL of 56.  He is advised to continue Lipitor 10 mg po nightly.  4)  Weight/Diet:  His BMI is 22.  He is not a candidate for weight loss.  He has maintained his gain  of 20 pounds of weight.  - He would benefit from addition of more complex carbohydrates, information provided. CDE consult is requested.  5. Vitamin D deficiency - He he is on ongoing supplement with vitamin D3 5000 units daily.  His vitamin D has improved to 62 with this maintenance dose.   6) Chronic Care/Health Maintenance: -Patient is on ACEI/ARB and Statin medications and encouraged to continue to follow up with Ophthalmology, Podiatrist at least yearly or according to recommendations, and advised to  Quit  smoking. I have recommended yearly flu vaccine and pneumonia vaccination at least every 5 years; moderate intensity exercise for up to 150 minutes weekly; and  sleep for at least 7 hours a day. -He is counseled extensively for smoking cessation.  - I advised patient to maintain close follow up with [name], MD for primary care needs. - I have advised him to avoid over-the-counter pain medications to avoid additional risk for renal insufficiency.  - Time spent on this patient care encounter:  35 min, of which > 50% was spent in  counseling and the rest reviewing his blood glucose logs , discussing his hypoglycemia and hyperglycemia episodes, reviewing his current and  previous labs / studies  ( including abstraction from other facilities) and medications  doses and developing a  long term treatment plan and documenting his care.   Please refer to Patient Instructions for Blood Glucose Monitoring and Insulin/Medications Dosing Guide"  in media tab for additional information. Please  also refer to " Patient Self  Inventory" in the Media  tab for reviewed elements of pertinent patient history.  Roseanna Rainbow Jahn participated in the discussions, expressed understanding, and voiced agreement with the above plans.  All questions were answered to his satisfaction. he is encouraged to contact clinic should he have any questions or concerns prior to his return visit.   Follow up plan: -Return in about 4 months (around 08/23/2020) for Diabetes follow up- A1c and urine micro in office, Bring glucometer and logs, No previsit labs.  Ronny Bacon, FNP-BC [facility] Phone: [phone] Fax: [phone]  04/23/2020, 8:29 AM

## 2020-04-24 ENCOUNTER — Ambulatory Visit (INDEPENDENT_AMBULATORY_CARE_PROVIDER_SITE_OTHER): Payer: Medicare Other | Admitting: Internal Medicine

## 2020-04-24 ENCOUNTER — Encounter (INDEPENDENT_AMBULATORY_CARE_PROVIDER_SITE_OTHER): Payer: Self-pay | Admitting: Internal Medicine

## 2020-04-24 VITALS — BP 155/50 | HR 87 | Temp 97.7°F | Ht 67.0 in | Wt 132.4 lb

## 2020-04-24 DIAGNOSIS — R079 Chest pain, unspecified: Secondary | ICD-10-CM | POA: Diagnosis not present

## 2020-04-24 DIAGNOSIS — N489 Disorder of penis, unspecified: Secondary | ICD-10-CM | POA: Diagnosis not present

## 2020-04-24 NOTE — Progress Notes (Addendum)
Metrics: Intervention Frequency ACO  Documented Smoking Status Yearly  Screened one or more times in 24 months  Cessation Counseling or  Active cessation medication Past 24 months  Past 24 months   Guideline developer: UpToDate (See UpToDate for funding source) Date Released: 2014       Wellness Office Visit  Subjective:  Patient ID: Chase Briggs, male    DOB: [birth year]  Age: 69 y.o. MRN: [id number]  CC: This man comes in for an acute visit because of left upper chest pain.  He is also concerned about the penile lesion that he had before. HPI  The left upper chest pain has been present for approximately a week and he thinks he may have lifted something that might of caused the pain.  The pain appears to come on when he moves his left shoulder and does not appear to be with exertion. He does have diabetes, hypertension and risk factors for coronary artery disease. As far as his penile lesion is concerned, I have looked at this previously and recommended some cream which she has been trying but it is still present. Past Medical History:  Diagnosis Date  . Diabetes mellitus   . Fatty liver   . GERD (gastroesophageal reflux disease)   . HTN (hypertension)   . Hyperlipidemia   . Insomnia    Past Surgical History:  Procedure Laterality Date  . ABDOMINAL AORTOGRAM W/LOWER EXTREMITY N/A 08/22/2018   Procedure: ABDOMINAL AORTOGRAM W/LOWER EXTREMITY;  Surgeon: [name], MD;  Location: [facility]VAS[facility] CV [facility];  Service: Cardiovascular;  Laterality: N/A;  . BACK SURGERY    . CARPAL TUNNEL WITH CUBITAL TUNNEL Right 03/21/2019   Procedure: RIGHT CARPAL TUNNEL AND CUBITAL  TUNNEL RELEASES;  Surgeon: [name], MD;  Location: [facility];  Service: Orthopedics;  Laterality: Right;  AXILLARY BLOCK  . COLONOSCOPY  01/2007   Dr. [name], reviewed report, mild diverticulosis. Prep adequate. Next TCS due 01/2017  . ESOPHAGOGASTRODUODENOSCOPY N/A 10/21/2012    [id number] GASTRITIS  . MASS EXCISION Right 10/26/2012   Procedure: EXCISION MASS;  Surgeon: [name], MD;  Location: [facility];  Service: General;  Laterality: Right;  . PERIPHERAL VASCULAR INTERVENTION  08/22/2018   Procedure: PERIPHERAL VASCULAR INTERVENTION;  Surgeon: [name], MD;  Location: [facility] INVASIVE CV LAB;  Service: Cardiovascular;;  bilateral external iliac stents  . scalp mass  4-5 yrs ago   Dr [name]  . ULNAR NERVE TRANSPOSITION Right 03/21/2019   Procedure: DECOMPRESSION RIGHT ULNAR NERVE;  Surgeon: [name], MD;  Location: [facility];  Service: Orthopedics;  Laterality: Right;     Family History  Problem Relation Age of Onset  . Diabetes Mother   . Diabetes Father   . Diabetes Brother   . Colon cancer Neg Hx   . 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 Married for 12 years. Has 2 sons (33 and 21). Lives with wife.   Social History   Tobacco Use  . Smoking status: Current Every Day Smoker    Packs/day: 0.50    Years: 25.00    Pack years: 12.50    Types: Cigarettes  . Smokeless tobacco: Never Used  Substance Use Topics  . 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 amLODipine (NORVASC) 10 MG tablet TAKE (1) TABLET BY MOUTH ONCE DAILY.  Marland Kitchen aspirin EC 81 MG tablet Take 81 mg by mouth daily.  Marland Kitchen atorvastatin (LIPITOR) 10 MG tablet TAKE ONE TABLET BY MOUTH ONCE DAILY.  Marland Kitchen Cholecalciferol (VITAMIN D) 125 MCG (5000 UT) CAPS Take 5,000 Units by mouth daily.   . clopidogrel (PLAVIX) 75 MG tablet TAKE (1) TABLET BY MOUTH ONCE DAILY.  . clotrimazole-betamethasone (LOTRISONE) cream Apply 1 application topically 2 (two) times daily.  . Ferrous Sulfate (IRON) 325 (65 Fe) MG TABS Take 325 mg by mouth daily.  Marland Kitchen glucose blood test strip 1 each by Other route 2 (two) times  daily. Use as instructed bid E11.65 Relion Premier  . insulin glargine (LANTUS SOLOSTAR) 100 UNIT/ML Solostar Pen Inject 10 Units into the skin daily with breakfast.  . Insulin Pen Needle (SURE COMFORT PEN NEEDLES) 32G X 4 MM MISC USING ONCE DAILY.  Marland Kitchen Lancets MISC 1 each by Does not apply route 2 (two) times daily. E11.65 Relion Premier  . lisinopril-hydrochlorothiazide (ZESTORETIC) 20-12.5 MG tablet Take 1 tablet by mouth daily.  . traZODone (DESYREL) 50 MG tablet TAKE ONE TABLET DAILY AT BEDTIME.       Depression screen [facility] 2/9 12/12/2019 11/14/2019 08/31/2017 05/31/2017 02/25/2017  Decreased Interest 0 0 0 0 0  Down, Depressed, Hopeless 0 0 0 0 0  PHQ - 2 Score 0 0 0 0 0     Objective:   Today's Vitals: BP (!) 155/50 (BP Location: Left Arm, Patient Position: Sitting, Cuff Size: Normal)   Pulse 87   Temp 97.7 F (36.5 C) (Temporal)   Ht 5\' 7"  (1.702 m)   Wt 132 lb 6.4 oz (60.1 kg)   SpO2 96%   BMI 20.74 kg/m  Vitals with BMI 04/24/2020 04/23/2020 03/28/2020  Height 5\' 7"  5\' 7"  5\' 7"   Weight 132 lbs 6 oz 133 lbs 6 oz 135 lbs 6 oz  BMI 20.73 20.89 21.2  Systolic 155 139 03/30/2020  Diastolic 50 66 60  Pulse 87 74 70     Physical Exam He looks systemically well and does not appear to be in pain.  Blood pressure is elevated systolically.  Examination of his chest wall does show some spasm in the left upper anterior chest wall reproducing the pain.  Examination of his penis shows a lesion, whitish in nature on the dorsal aspect of his penis.  I am not sure what this is.    An ECG was done in the office which shows normal sinus rhythm without any acute ST-T wave changes.  Assessment   1. Chest pain, unspecified type   2. Penile lesion       Tests ordered Orders Placed This Encounter  Procedures  . Ambulatory referral to Cardiology  . Ambulatory referral to Urology     Plan: 1. Although I think the chest pain is muscular in nature, he certainly has risk factors and I think  it is better to get him evaluated and I will refer him to cardiology now. 2. I will refer him to urology for his penile lesion to make sure there is nothing more sinister going on here. 3. Follow-up as scheduled.   No orders of the defined types were placed in this encounter.   , MD

## 2020-04-29 ENCOUNTER — Encounter (INDEPENDENT_AMBULATORY_CARE_PROVIDER_SITE_OTHER): Payer: Self-pay | Admitting: Nurse Practitioner

## 2020-04-29 ENCOUNTER — Ambulatory Visit (INDEPENDENT_AMBULATORY_CARE_PROVIDER_SITE_OTHER): Payer: Medicare Other | Admitting: Nurse Practitioner

## 2020-04-29 ENCOUNTER — Other Ambulatory Visit: Payer: Self-pay

## 2020-04-29 VITALS — BP 125/65 | HR 91 | Temp 97.7°F | Ht 67.0 in | Wt 133.0 lb

## 2020-04-29 DIAGNOSIS — L600 Ingrowing nail: Secondary | ICD-10-CM

## 2020-04-29 MED ORDER — CEPHALEXIN 500 MG PO CAPS: 500.0000 mg | ORAL_CAPSULE | Freq: Three times a day (TID) | ORAL | 0 refills | Status: DC

## 2020-04-29 NOTE — Progress Notes (Signed)
Subjective:  Patient ID: Chase Briggs, male    DOB: 11/22/1950  Age: 69 y.o. MRN: [id number]  CC:  Chief Complaint  Patient presents with  . Foot Pain    pinky toe right foot      HPI  This patient arrives today for an acute visit for the above.  He has been experiencing right toe pain for approximately 1 week.  He tells me is also noted some redness to the area.  He has not noted any drainage she has not experienced any fevers.  He tells me it hurts worse at night when he is trying to sleep, but he can feel the pain during the day as well.  He rates the pain as a 6-7 out of 10 in intensity.  He has not tried any over-the-counter medication to treat the pain or tried anything to improve the redness in the area.  He is concerned that he could be developing an infection and due to his history of diabetes he wanted to have it evaluated as soon as possible.   Past Medical History:  Diagnosis Date  . Diabetes mellitus   . Fatty liver   . GERD (gastroesophageal reflux disease)   . HTN (hypertension)   . Hyperlipidemia   . Insomnia       Family History  Problem Relation Age of Onset  . Diabetes Mother   . Diabetes Father   . Diabetes Brother   . Colon cancer Neg Hx   . 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 Married for 12 years. Has 2 sons (33 and 21). Lives with wife.   Social History   Tobacco Use  . Smoking status: Current Every Day Smoker    Packs/day: 0.50    Years: 25.00    Pack years: 12.50    Types: Cigarettes  . Smokeless tobacco: Never Used  Substance Use Topics  . Alcohol use: No    Comment: hx heavy etoh x 3 yrs, quit 30+ yrs ago     Current Meds  Medication Sig  . amLODipine (NORVASC) 10 MG tablet TAKE (1) TABLET BY MOUTH ONCE DAILY.  Marland Kitchen aspirin EC 81 MG tablet Take 81 mg by mouth daily.  Marland Kitchen atorvastatin (LIPITOR) 10 MG tablet  TAKE ONE TABLET BY MOUTH ONCE DAILY.  Marland Kitchen Cholecalciferol (VITAMIN D) 125 MCG (5000 UT) CAPS Take 5,000 Units by mouth daily.   . clopidogrel (PLAVIX) 75 MG tablet TAKE (1) TABLET BY MOUTH ONCE DAILY.  . clotrimazole-betamethasone (LOTRISONE) cream Apply 1 application topically 2 (two) times daily.  . Ferrous Sulfate (IRON) 325 (65 Fe) MG TABS Take 325 mg by mouth daily.  Marland Kitchen glucose blood test strip 1 each by Other route 2 (two) times daily. Use as instructed bid E11.65 Relion Premier  . insulin glargine (LANTUS SOLOSTAR) 100 UNIT/ML Solostar Pen Inject 10 Units into the skin daily with breakfast.  . Insulin Pen Needle (SURE COMFORT PEN NEEDLES) 32G X 4 MM MISC USING ONCE DAILY.  Marland Kitchen Lancets MISC 1 each by Does not apply route 2 (two) times daily. E11.65 Relion Premier  . lisinopril-hydrochlorothiazide (ZESTORETIC) 20-12.5 MG tablet Take 1 tablet by mouth daily.  . traZODone (DESYREL) 50 MG tablet TAKE ONE TABLET DAILY AT BEDTIME.    ROS:  See HPI   Objective:   Today's Vitals: BP 125/65 (BP  Location: Left Arm, Patient Position: Sitting, Cuff Size: Normal)   Pulse 91   Temp 97.7 F (36.5 C) (Temporal)   Ht 5\' 7"  (1.702 m)   Wt 133 lb (60.3 kg)   SpO2 97%   BMI 20.83 kg/m  Vitals with BMI 04/29/2020 04/24/2020 04/23/2020  Height 5\' 7"  5\' 7"  5\' 7"   Weight 133 lbs 132 lbs 6 oz 133 lbs 6 oz  BMI 20.83 20.73 20.89  Systolic 125 155 06/23/2020  Diastolic 65 50 66  Pulse 91 87 74     Physical Exam Constitutional:      Appearance: Normal appearance.  HENT:     Head: Normocephalic and atraumatic.  Feet:     Right foot:     Skin integrity: Erythema and warmth present.     Toenail Condition: Right toenails are long and ingrown.  Skin:      Neurological:     Mental Status: He is alert.          Assessment and Plan   1. Ingrown toenail of right foot      Plan: 1.  It appears that his pain is related to an ingrown toenail of his right fifth phalange.  Skin is warm and mildly red  which may represent beginnings of infection.  I will treat with Keflex as he is high risk for developing an infection due to his history of type 2 diabetes.  Due to his kidney function being mildly decreased will prescribe 1 tablet by mouth 3 times a day.  He was counseled regarding common side effects and what he should do if he experiences signs of Stevens-Johnson's.  I will also refer him to podiatry to have discussion regarding management of his ingrown toenail.   Tests ordered Orders Placed This Encounter  Procedures  . Ambulatory referral to Podiatry      Meds ordered this encounter  Medications  . cephALEXin (KEFLEX) 500 MG capsule    Sig: Take 1 capsule (500 mg total) by mouth 3 (three) times daily.    Dispense:  30 capsule    Refill:  0    Order Specific Question:   Supervising Provider    Answer:   [[id number]]    Patient to follow-up as scheduled in November or sooner as needed  , NP

## 2020-05-07 ENCOUNTER — Ambulatory Visit (INDEPENDENT_AMBULATORY_CARE_PROVIDER_SITE_OTHER): Payer: Medicare Other | Admitting: Internal Medicine

## 2020-05-07 ENCOUNTER — Other Ambulatory Visit: Payer: Self-pay

## 2020-05-07 ENCOUNTER — Encounter (INDEPENDENT_AMBULATORY_CARE_PROVIDER_SITE_OTHER): Payer: Self-pay | Admitting: Internal Medicine

## 2020-05-07 VITALS — BP 150/100 | HR 89 | Temp 97.7°F | Resp 18 | Ht 67.0 in | Wt 136.0 lb

## 2020-05-07 DIAGNOSIS — M545 Low back pain, unspecified: Secondary | ICD-10-CM

## 2020-05-07 MED ORDER — CYCLOBENZAPRINE HCL 5 MG PO TABS
5.0000 mg | ORAL_TABLET | Freq: Three times a day (TID) | ORAL | 1 refills | Status: DC | PRN
Start: 2020-05-07 — End: 2020-05-23

## 2020-05-07 NOTE — Progress Notes (Signed)
Metrics: Intervention Frequency ACO  Documented Smoking Status Yearly  Screened one or more times in 24 months  Cessation Counseling or  Active cessation medication Past 24 months  Past 24 months   Guideline developer: UpToDate (See UpToDate for funding source) Date Released: 2014       Wellness Office Visit  Subjective:  Patient ID: Chase Briggs, male    DOB: 06/11/1951  Age: 69 y.o. MRN: [id number]  CC: Right low back pain HPI This patient comes in for an acute visit with the above symptom which she has had for the last week or so.  He was lifting heavy machinery of some sort and I think it started after this.  He is able to walk and the pain is in his right lower back and does not radiate anywhere else.  He has no urinary or bowel symptoms.  Past Medical History:  Diagnosis Date  . Diabetes mellitus   . Fatty liver   . GERD (gastroesophageal reflux disease)   . HTN (hypertension)   . Hyperlipidemia   . Insomnia    Past Surgical History:  Procedure Laterality Date  . ABDOMINAL AORTOGRAM W/LOWER EXTREMITY N/A 08/22/2018   Procedure: ABDOMINAL AORTOGRAM W/LOWER EXTREMITY;  Surgeon: [name], MD;  [facility]: [facility] INVASIVE CV LAB;  Service: Cardiovascular;  Laterality: N/A;  . BACK SURGERY    . CARPAL TUNNEL WITH CUBITAL TUNNEL Right 03/21/2019   Procedure: RIGHT CARPAL TUNNEL AND CUBITAL  TUNNEL RELEASES;  Surgeon: [name], MD;  Location: [facility];  Service: Orthopedics;  Laterality: Right;  AXILLARY BLOCK  . COLONOSCOPY  01/2007   Dr. [name], reviewed report, mild diverticulosis. Prep adequate. Next TCS due 01/2017  . ESOPHAGOGASTRODUODENOSCOPY N/A 10/21/2012   [id number] GASTRITIS  . MASS EXCISION Right 10/26/2012   Procedure: EXCISION MASS;  Surgeon: [name], MD;  Location: [facility];  Service: General;  Laterality: Right;  . PERIPHERAL VASCULAR INTERVENTION  08/22/2018   Procedure: PERIPHERAL VASCULAR INTERVENTION;  Surgeon: [name], MD;  Location: [facility] INVASIVE CV LAB;  Service: Cardiovascular;;  bilateral external iliac stents  . scalp mass  4-5 yrs ago   Dr [name]  . ULNAR NERVE TRANSPOSITION Right 03/21/2019   Procedure: DECOMPRESSION RIGHT ULNAR NERVE;  Surgeon: [name], MD;  Location: [facility];  Service: Orthopedics;  Laterality: Right;     Family History  Problem Relation Age of Onset  . Diabetes Mother   . Diabetes Father   . Diabetes Brother   . Colon cancer Neg Hx   . 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 Married for 12 years. Has 2 sons (33 and 21). Lives with wife.   Social History   Tobacco Use  . Smoking status: Current Every Day Smoker    Packs/day: 0.50    Years: 25.00    Pack years: 12.50    Types: Cigarettes  . Smokeless tobacco: Never Used  Substance Use Topics  . 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 amLODipine (NORVASC) 10 MG tablet TAKE (1) TABLET BY MOUTH ONCE DAILY.  Marland Kitchen aspirin EC 81 MG tablet Take 81 mg by mouth daily.  Marland Kitchen atorvastatin (LIPITOR) 10 MG tablet TAKE ONE TABLET BY MOUTH ONCE DAILY.  Marland Kitchen  cephALEXin (KEFLEX) 500 MG capsule Take 1 capsule (500 mg total) by mouth 3 (three) times daily.  . Cholecalciferol (VITAMIN D) 125 MCG (5000 UT) CAPS Take 5,000 Units by mouth daily.   . clopidogrel (PLAVIX) 75 MG tablet TAKE (1) TABLET BY MOUTH ONCE DAILY.  . clotrimazole-betamethasone (LOTRISONE) cream Apply 1 application topically 2 (two) times daily.  . Ferrous Sulfate (IRON) 325 (65 Fe) MG TABS Take 325 mg by mouth daily.  Marland Kitchen glucose blood test strip 1 each by Other route 2 (two) times daily. Use as instructed bid E11.65 Relion Premier  . insulin glargine (LANTUS SOLOSTAR) 100 UNIT/ML Solostar Pen Inject 10 Units into the skin daily with breakfast.  . Insulin Pen  Needle (SURE COMFORT PEN NEEDLES) 32G X 4 MM MISC USING ONCE DAILY.  Marland Kitchen Lancets MISC 1 each by Does not apply route 2 (two) times daily. E11.65 Relion Premier  . lisinopril-hydrochlorothiazide (ZESTORETIC) 20-12.5 MG tablet Take 1 tablet by mouth daily.  . traZODone (DESYREL) 50 MG tablet TAKE ONE TABLET DAILY AT BEDTIME.      Depression screen [facility] 2/9 12/12/2019 11/14/2019 08/31/2017 05/31/2017 02/25/2017  Decreased Interest 0 0 0 0 0  Down, Depressed, Hopeless 0 0 0 0 0  PHQ - 2 Score 0 0 0 0 0     Objective:   Today's Vitals: BP (!) 150/100 (BP Location: Left Arm, Patient Position: Sitting, Cuff Size: Normal)   Pulse 89   Temp 97.7 F (36.5 C) (Temporal)   Resp 18   Ht 5\' 7"  (1.702 m)   Wt 136 lb (61.7 kg)   SpO2 98%   BMI 21.30 kg/m  Vitals with BMI 05/07/2020 04/29/2020 04/24/2020  Height 5\' 7"  5\' 7"  5\' 7"   Weight 136 lbs 133 lbs 132 lbs 6 oz  BMI 21.3 20.83 20.73  Systolic 150 125 06/24/2020  Diastolic 100 65 50  Pulse 89 91 87     Physical Exam   On physical examination there is no bony spinal tenderness.  He is tender acutely in the right paramedian area around the region of L4-L5 and I can feel a distinct muscle spasm which reproduces the pain.    Assessment   1. Acute right-sided low back pain without sciatica       Tests ordered No orders of the defined types were placed in this encounter.    Plan: 1. He appears to have muscular spasm and he was given dexamethasone 4 mg intramuscular in the office today and I have sent a prescription for cyclobenzaprine as a muscle relaxant to see if this will help him.  Also instructed him in some basic back stretching exercises. 2. If he does not improve in the next 2 to 3 weeks time, he will call back.   Meds ordered this encounter  Medications  . cyclobenzaprine (FLEXERIL) 5 MG tablet    Sig: Take 1 tablet (5 mg total) by mouth 3 (three) times daily as needed for muscle spasms.    Dispense:  30 tablet    Refill:  1      Gearldean Lomanto , MD

## 2020-05-13 ENCOUNTER — Other Ambulatory Visit (INDEPENDENT_AMBULATORY_CARE_PROVIDER_SITE_OTHER): Payer: Self-pay

## 2020-05-13 ENCOUNTER — Telehealth: Payer: Self-pay | Admitting: Nurse Practitioner

## 2020-05-13 DIAGNOSIS — L600 Ingrowing nail: Secondary | ICD-10-CM

## 2020-05-13 MED ORDER — CEPHALEXIN 500 MG PO CAPS: 500.0000 mg | ORAL_CAPSULE | Freq: Three times a day (TID) | ORAL | 0 refills | Status: DC

## 2020-05-13 NOTE — Telephone Encounter (Signed)
He can increase his Lantus to 14 units once daily in the morning for the next week.

## 2020-05-13 NOTE — Telephone Encounter (Signed)
Patient advised, verbalized understanding

## 2020-05-13 NOTE — Telephone Encounter (Signed)
Patient had a steroid shot on Thursday. Please Advise.   Friday- 199, 301 Saturday-165, 239 Sunday-244, 222 Monday-251

## 2020-05-13 NOTE — Telephone Encounter (Signed)
Chase Briggs is calling stating that his appointment with the podiatrist is not until the 27th of August and he is asking if he can get a refill on the antibiotic until then, Please advise?

## 2020-05-14 DIAGNOSIS — L600 Ingrowing nail: Secondary | ICD-10-CM | POA: Diagnosis not present

## 2020-05-14 DIAGNOSIS — L84 Corns and callosities: Secondary | ICD-10-CM | POA: Diagnosis not present

## 2020-05-14 DIAGNOSIS — M79674 Pain in right toe(s): Secondary | ICD-10-CM | POA: Diagnosis not present

## 2020-05-17 ENCOUNTER — Ambulatory Visit: Payer: Medicare Other | Admitting: Urology

## 2020-05-17 ENCOUNTER — Ambulatory Visit (INDEPENDENT_AMBULATORY_CARE_PROVIDER_SITE_OTHER): Payer: Medicare Other | Admitting: Urology

## 2020-05-17 ENCOUNTER — Other Ambulatory Visit: Payer: Self-pay

## 2020-05-17 ENCOUNTER — Encounter: Payer: Self-pay | Admitting: Urology

## 2020-05-17 VITALS — BP 164/69 | HR 103 | Temp 98.2°F | Ht 67.0 in | Wt 136.0 lb

## 2020-05-17 DIAGNOSIS — N489 Disorder of penis, unspecified: Secondary | ICD-10-CM

## 2020-05-17 NOTE — Progress Notes (Signed)
05/17/2020 3:15 PM   Chase Briggs 09-Jun-1951 [id number]  Referring provider: [name], MD [address] [place],  [place] [address]  Penile lesion  HPI: Chase Briggs is a 69yo here for evaluation of a penile lesion. He noticed a lesion 1 month ago on his penile. Nonpainful. Dr. Marianne Sofia him clotrimazole which improved the lesion. He DMII and A1c is 6.8. He is uncircumicised. He denies any penile pain. NO LUTS.  No UTIs   PMH: Past Medical History:  Diagnosis Date  . Diabetes mellitus   . Fatty liver   . GERD (gastroesophageal reflux disease)   . HTN (hypertension)   . Hyperlipidemia   . Insomnia     Surgical History: Past Surgical History:  Procedure Laterality Date  . ABDOMINAL AORTOGRAM W/LOWER EXTREMITY N/A 08/22/2018   Procedure: ABDOMINAL AORTOGRAM W/LOWER EXTREMITY;  Surgeon: [name], MD;  Location: [facility] INVASIVE CV LAB;  Service: Cardiovascular;  Laterality: N/A;  . BACK SURGERY    . CARPAL TUNNEL WITH CUBITAL TUNNEL Right 03/21/2019   Procedure: RIGHT CARPAL TUNNEL AND CUBITAL  TUNNEL RELEASES;  Surgeon: [name], MD;  Location: [facility];  Service: Orthopedics;  Laterality: Right;  AXILLARY BLOCK  . COLONOSCOPY  01/2007   Dr. [name], reviewed report, mild diverticulosis. Prep adequate. Next TCS due 01/2017  . ESOPHAGOGASTRODUODENOSCOPY N/A 10/21/2012   [id number] GASTRITIS  . MASS EXCISION Right 10/26/2012   Procedure: EXCISION MASS;  Surgeon: [name], MD;  Location: [facility];  Service: General;  Laterality: Right;  . PERIPHERAL VASCULAR INTERVENTION  08/22/2018   Procedure: PERIPHERAL VASCULAR INTERVENTION;  Surgeon: [name], MD;  Location: [facility] INVASIVE CV LAB;  Service: Cardiovascular;;  bilateral external iliac stents  . scalp mass  4-5 yrs ago   Dr [name]  . ULNAR NERVE TRANSPOSITION Right 03/21/2019   Procedure: DECOMPRESSION RIGHT ULNAR NERVE;  Surgeon: [name], MD;  Location:  [facility];  Service: Orthopedics;  Laterality: Right;    Home Medications:  Allergies as of 05/17/2020   No Known Allergies     Medication List       Accurate as of May 17, 2020  3:15 PM. If you have any questions, ask your nurse or doctor.        amLODipine 10 MG tablet Commonly known as: NORVASC TAKE (1) TABLET BY MOUTH ONCE DAILY.   aspirin EC 81 MG tablet Take 81 mg by mouth daily.   atorvastatin 10 MG tablet Commonly known as: LIPITOR TAKE ONE TABLET BY MOUTH ONCE DAILY.   cephALEXin 500 MG capsule Commonly known as: KEFLEX Take 1 capsule (500 mg total) by mouth 3 (three) times daily.   clopidogrel 75 MG tablet Commonly known as: PLAVIX TAKE (1) TABLET BY MOUTH ONCE DAILY.   clotrimazole-betamethasone cream Commonly known as: Lotrisone Apply 1 application topically 2 (two) times daily.   cyclobenzaprine 5 MG tablet Commonly known as: FLEXERIL Take 1 tablet (5 mg total) by mouth 3 (three) times daily as needed for muscle spasms.   glucose blood test strip 1 each by Other route 2 (two) times daily. Use as instructed bid E11.65 Relion Premier   Iron 325 (65 Fe) MG Tabs Take 325 mg by mouth daily.   Lancets Misc 1 each by Does not apply route 2 (two) times daily. E11.65 Relion Premier   Lantus SoloStar 100 UNIT/ML Solostar Pen Generic drug: insulin glargine Inject 10 Units into the skin daily with breakfast.  lisinopril-hydrochlorothiazide 20-12.5 MG tablet Commonly known as: Zestoretic Take 1 tablet by mouth daily.   SSD 1 % cream Generic drug: silver sulfADIAZINE Apply topically daily.   Sure Comfort Pen Needles 32G X 4 MM Misc Generic drug: Insulin Pen Needle USING ONCE DAILY.   traZODone 50 MG tablet Commonly known as: DESYREL TAKE ONE TABLET DAILY AT BEDTIME.   Vitamin D 125 MCG (5000 UT) Caps Take 5,000 Units by mouth daily.       Allergies: No Known Allergies  Family History: Family History  Problem Relation  Age of Onset  . Diabetes Mother   . Diabetes Father   . Diabetes Brother   . Colon cancer Neg Hx   . Liver disease Neg Hx     Social History:  reports that he has been smoking cigarettes. He has a 35.00 pack-year smoking history. He has never used smokeless tobacco. He reports that he does not drink alcohol and does not use drugs.  ROS: All other review of systems were reviewed and are negative except what is noted above in HPI  Physical Exam: BP (!) 164/69   Pulse (!) 103   Temp 98.2 F (36.8 C)   Ht 5\' 7"  (1.702 m)   Wt 136 lb (61.7 kg)   BMI 21.30 kg/m   Constitutional:  Alert and oriented, No acute distress. HEENT: [place] AT, moist mucus membranes.  Trachea midline, no masses. Cardiovascular: No clubbing, cyanosis, or edema. Respiratory: Normal respiratory effort, no increased work of breathing. GI: Abdomen is soft, nontender, nondistended, no abdominal masses GU: No CVA tenderness. uncircumcised phallus. No masses/lesions on penis, testis, scrotum. Healing dorsal mid penile shaft lesion.   Lymph: No cervical or inguinal lymphadenopathy. Skin: No rashes, bruises or suspicious lesions. Neurologic: Grossly intact, no focal deficits, moving all 4 extremities. Psychiatric: Normal mood and affect.  Laboratory Data: Lab Results  Component Value Date   WBC 7.2 09/16/2018   HGB 12.8 (L) 09/16/2018   HCT 39.5 09/16/2018   MCV 94.5 09/16/2018   PLT 202 09/16/2018    Lab Results  Component Value Date   CREATININE 1.34 (H) 03/28/2020    No results found for: PSA  No results found for: TESTOSTERONE  Lab Results  Component Value Date   HGBA1C 6.8 (A) 04/23/2020    Urinalysis    Component Value Date/Time   COLORURINE STRAW (A) 09/16/2018 0851   APPEARANCEUR CLEAR 09/16/2018 0851   LABSPEC 1.005 09/16/2018 0851   PHURINE 6.0 09/16/2018 0851   GLUCOSEU >=500 (A) 09/16/2018 0851   HGBUR SMALL (A) 09/16/2018 0851   BILIRUBINUR NEGATIVE 09/16/2018 0851   KETONESUR  NEGATIVE 09/16/2018 0851   PROTEINUR NEGATIVE 09/16/2018 0851   UROBILINOGEN 0.2 02/19/2014 1340   NITRITE NEGATIVE 09/16/2018 0851   LEUKOCYTESUR NEGATIVE 09/16/2018 0851    Lab Results  Component Value Date   BACTERIA RARE (A) 09/16/2018    Pertinent Imaging:  No results found for this or any previous visit.  No results found for this or any previous visit.  No results found for this or any previous visit.  No results found for this or any previous visit.  No results found for this or any previous visit.  No results found for this or any previous visit.  No results found for this or any previous visit.  No results found for this or any previous visit.   Assessment & Plan:    1. Penile lesion -continue clotrimazole BID for 4 weeks. RTC 3 months for  wound check - Urinalysis, Routine w reflex microscopic   No follow-ups on file.  Wilkie Aye, MD  [facility] [place]

## 2020-05-17 NOTE — Progress Notes (Signed)
Urological Symptom Review  Patient is experiencing the following symptoms: Frequent urination Erection problems (male only) Penile pain (male only)    Review of Systems  Gastrointestinal (upper)  : Negative for upper GI symptoms  Gastrointestinal (lower) : Negative for lower GI symptoms  Constitutional : Weight loss  Skin: Negative for skin symptoms  Eyes: Negative for eye symptoms  Ear/Nose/Throat : Negative for Ear/Nose/Throat symptoms  Hematologic/Lymphatic: Easy bruising  Cardiovascular : Negative for cardiovascular symptoms  Respiratory : Negative for respiratory symptoms  Endocrine: Negative for endocrine symptoms  Musculoskeletal: Back pain Joint pain  Neurological: Negative for neurological symptoms  Psychologic: Depression

## 2020-05-21 ENCOUNTER — Encounter: Payer: Self-pay | Admitting: Urology

## 2020-05-21 DIAGNOSIS — N489 Disorder of penis, unspecified: Secondary | ICD-10-CM | POA: Diagnosis not present

## 2020-05-21 LAB — URINALYSIS, ROUTINE W REFLEX MICROSCOPIC
Bilirubin, UA: NEGATIVE
Ketones, UA: NEGATIVE
Leukocytes,UA: NEGATIVE
Nitrite, UA: NEGATIVE
RBC, UA: NEGATIVE
Specific Gravity, UA: 1.015 (ref 1.005–1.030)
Urobilinogen, Ur: 0.2 mg/dL (ref 0.2–1.0)
pH, UA: 7 (ref 5.0–7.5)

## 2020-05-21 NOTE — Patient Instructions (Signed)

## 2020-05-22 ENCOUNTER — Telehealth (INDEPENDENT_AMBULATORY_CARE_PROVIDER_SITE_OTHER): Payer: Self-pay

## 2020-05-22 NOTE — Telephone Encounter (Signed)
He is scheduled tomorrow afternoon

## 2020-05-22 NOTE — Telephone Encounter (Signed)
Okay, please make an appointment t for him to be seen tomorrow if possible or early next week by me or Sarah.  Thanks.

## 2020-05-22 NOTE — Telephone Encounter (Signed)
Chase Briggs states that he is still having the right side hip pain it hurts most when he gets up but after he walks around for awhile it gets some better, please advise?

## 2020-05-23 ENCOUNTER — Other Ambulatory Visit: Payer: Self-pay

## 2020-05-23 ENCOUNTER — Encounter (INDEPENDENT_AMBULATORY_CARE_PROVIDER_SITE_OTHER): Payer: Self-pay | Admitting: Internal Medicine

## 2020-05-23 ENCOUNTER — Ambulatory Visit (INDEPENDENT_AMBULATORY_CARE_PROVIDER_SITE_OTHER): Payer: Medicare Other | Admitting: Internal Medicine

## 2020-05-23 VITALS — BP 160/70 | Temp 96.6°F | Ht 67.0 in | Wt 132.0 lb

## 2020-05-23 DIAGNOSIS — M545 Low back pain, unspecified: Secondary | ICD-10-CM

## 2020-05-23 DIAGNOSIS — R079 Chest pain, unspecified: Secondary | ICD-10-CM

## 2020-05-23 MED ORDER — CYCLOBENZAPRINE HCL 5 MG PO TABS: 5.0000 mg | ORAL_TABLET | Freq: Three times a day (TID) | ORAL | 1 refills | Status: DC | PRN

## 2020-05-23 NOTE — Progress Notes (Signed)
Metrics: Intervention Frequency ACO  Documented Smoking Status Yearly  Screened one or more times in 24 months  Cessation Counseling or  Active cessation medication Past 24 months  Past 24 months   Guideline developer: UpToDate (See UpToDate for funding source) Date Released: 2014       Wellness Office Visit  Subjective:  Patient ID: Chase Briggs, male    DOB: July 10, 1951  Age: 69 y.o. MRN: [id number]  CC: Low back pain, chest pain HPI  This man returns with the same pains he would have previously. The right lower back pain that I saw him recently for did seem to improve with a combination of steroids and muscle relaxants but now he seems to have come back although today it is not as bad as it was yesterday. He has now also begun to get left upper chest pain when he moves his left shoulder. Past Medical History:  Diagnosis Date  . Diabetes mellitus   . Fatty liver   . GERD (gastroesophageal reflux disease)   . HTN (hypertension)   . Hyperlipidemia   . Insomnia    Past Surgical History:  Procedure Laterality Date  . ABDOMINAL AORTOGRAM W/LOWER EXTREMITY N/A 08/22/2018   Procedure: ABDOMINAL AORTOGRAM W/LOWER EXTREMITY;  Surgeon: [name], MD;  Location[facility] INVAS[facility] CV LAB;  Service: Cardiovascular;  Laterality: N/A;  . BACK SURGERY    . CARPAL TUNNEL WITH CUBITAL TUNNEL Right 03/21/2019   Procedure: RIGHT CARPAL TUNNEL AND CUBITAL  TUNNEL RELEASES;  Surgeon: [name], MD;  Location: [facility];  Service: Orthopedics;  Laterality: Right;  AXILLARY BLOCK  . COLONOSCOPY  01/2007   Dr. [name], reviewed report, mild diverticulosis. Prep adequate. Next TCS due 01/2017  . ESOPHAGOGASTRODUODENOSCOPY N/A 10/21/2012   [id number] GASTRITIS  . MASS EXCISION Right 10/26/2012   Procedure: EXCISION MASS;  Surgeon: [name], MD;  Location: [facility];  Service: General;  Laterality: Right;  . PERIPHERAL VASCULAR INTERVENTION  08/22/2018   Procedure:  PERIPHERAL VASCULAR INTERVENTION;  Surgeon: [name], MD;  Location: [facility] INVASIVE CV LAB;  Service: Cardiovascular;;  bilateral external iliac stents  . scalp mass  4-5 yrs ago   Dr [name]  . ULNAR NERVE TRANSPOSITION Right 03/21/2019   Procedure: DECOMPRESSION RIGHT ULNAR NERVE;  Surgeon: [name], MD;  Location: [facility];  Service: Orthopedics;  Laterality: Right;     Family History  Problem Relation Age of Onset  . Diabetes Mother   . Diabetes Father   . Diabetes Brother   . Colon cancer Neg Hx   . 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 Married for 12 years. Has 2 sons (33 and 21). Lives with wife.   Social History   Tobacco Use  . Smoking status: Current Every Day Smoker    Packs/day: 1.00    Years: 35.00    Pack years: 35.00    Types: Cigarettes  . Smokeless tobacco: Never Used  Substance Use Topics  . 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 amLODipine (NORVASC) 10 MG tablet TAKE (1) TABLET BY MOUTH ONCE DAILY.  Marland Kitchen aspirin EC 81 MG tablet Take 81 mg by mouth daily.  Marland Kitchen atorvastatin (LIPITOR) 10 MG tablet TAKE ONE TABLET  BY MOUTH ONCE DAILY.  . cephALEXin (KEFLEX) 500 MG capsule Take 1 capsule (500 mg total) by mouth 3 (three) times daily.  . Cholecalciferol (VITAMIN D) 125 MCG (5000 UT) CAPS Take 5,000 Units by mouth daily.   . clopidogrel (PLAVIX) 75 MG tablet TAKE (1) TABLET BY MOUTH ONCE DAILY.  . clotrimazole-betamethasone (LOTRISONE) cream Apply 1 application topically 2 (two) times daily.  . cyclobenzaprine (FLEXERIL) 5 MG tablet Take 1 tablet (5 mg total) by mouth 3 (three) times daily as needed for muscle spasms.  . Ferrous Sulfate (IRON) 325 (65 Fe) MG TABS Take 325 mg by mouth daily.  Marland Kitchen glucose blood test strip 1 each by Other route 2 (two) times daily.  Use as instructed bid E11.65 Relion Premier  . insulin glargine (LANTUS SOLOSTAR) 100 UNIT/ML Solostar Pen Inject 10 Units into the skin daily with breakfast.  . Insulin Pen Needle (SURE COMFORT PEN NEEDLES) 32G X 4 MM MISC USING ONCE DAILY.  Marland Kitchen Lancets MISC 1 each by Does not apply route 2 (two) times daily. E11.65 Relion Premier  . lisinopril-hydrochlorothiazide (ZESTORETIC) 20-12.5 MG tablet Take 1 tablet by mouth daily.  Marland Kitchen SSD 1 % cream Apply topically daily.  . traZODone (DESYREL) 50 MG tablet TAKE ONE TABLET DAILY AT BEDTIME.  . [DISCONTINUED] cyclobenzaprine (FLEXERIL) 5 MG tablet Take 1 tablet (5 mg total) by mouth 3 (three) times daily as needed for muscle spasms.      Depression screen [facility] 2/9 12/12/2019 11/14/2019 08/31/2017 05/31/2017 02/25/2017  Decreased Interest 0 0 0 0 0  Down, Depressed, Hopeless 0 0 0 0 0  PHQ - 2 Score 0 0 0 0 0     Objective:   Today's Vitals: BP (!) 160/70 (BP Location: Left Arm, Patient Position: Sitting, Cuff Size: Normal)   Temp (!) 96.6 F (35.9 C) (Temporal)   Ht 5\' 7"  (1.702 m)   Wt 132 lb (59.9 kg)   BMI 20.67 kg/m  Vitals with BMI 05/23/2020 05/17/2020 05/07/2020  Height 5\' 7"  5\' 7"  5\' 7"   Weight 132 lbs 136 lbs 136 lbs  BMI 20.67 21.3 21.3  Systolic 160 164 05/09/2020  Diastolic 70 69 100  Pulse - 103 89     Physical Exam  On physical exam, he appears to have left anterior chest wall muscle spasm.  Movements of the left shoulder appear to be normal but I am not sure about this. He also has spasm in the right lower back around the region of L4/L5 in the right paramedian area.     Assessment   1. Acute right-sided low back pain without sciatica   2. Chest pain, unspecified type       Tests ordered Orders Placed This Encounter  Procedures  . Ambulatory referral to Orthopedic Surgery     Plan: 1. I will send him back to orthopedics for further evaluation of these areas as he told me he had seen orthopedics before.  In the meantime  I will issue him another prescription for cyclobenzaprine as it did seem to help him previously. 2. Follow-up as previously scheduled in November   Meds ordered this encounter  Medications  . cyclobenzaprine (FLEXERIL) 5 MG tablet    Sig: Take 1 tablet (5 mg total) by mouth 3 (three) times daily as needed for muscle spasms.    Dispense:  30 tablet    Refill:  1    Maddie Brazier , MD

## 2020-05-28 DIAGNOSIS — L600 Ingrowing nail: Secondary | ICD-10-CM | POA: Diagnosis not present

## 2020-06-05 ENCOUNTER — Ambulatory Visit (INDEPENDENT_AMBULATORY_CARE_PROVIDER_SITE_OTHER): Payer: Medicare Other | Admitting: Orthopedic Surgery

## 2020-06-05 ENCOUNTER — Other Ambulatory Visit: Payer: Self-pay

## 2020-06-05 ENCOUNTER — Ambulatory Visit: Payer: Medicare Other

## 2020-06-05 ENCOUNTER — Encounter: Payer: Self-pay | Admitting: Orthopedic Surgery

## 2020-06-05 VITALS — BP 135/82 | HR 101 | Ht 67.0 in | Wt 130.0 lb

## 2020-06-05 DIAGNOSIS — G8929 Other chronic pain: Secondary | ICD-10-CM

## 2020-06-05 DIAGNOSIS — M545 Low back pain: Secondary | ICD-10-CM | POA: Diagnosis not present

## 2020-06-05 NOTE — Progress Notes (Signed)
New Patient Visit  Assessment: Chase Briggs is a 69 y.o. 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  He has been dealing with this for several years, and has previously had one procedure.  His pain is primarily located in lower back, with it being more painful on the right side compared to the left.  He denies radiculopathy and only occasionally has some numbness around his great toe, as well as an area anterior medial to his knee.  I reviewed his x-rays which demonstrate obvious degenerative changes with significant osteophyte formation, particularly over the anterior column.  He also has a flat lower back compared to normal.  As such, there a number of reasons for him to have pain.  At this point in time, I have encouraged him to continue taking the Flexeril as needed, and I think he would benefit from some physical therapy.  He was provided with a prescription for therapy today.  I will also reach out to his primary care physician to recommend NSAIDs provided that he is medically able to take these medications.  I will plan to see him back in approximately 3 months for repeat evaluation.  At that time we can discuss the possibility of a referral to a back surgeon, if he is interested.  Follow-up: 3 months  Subjective:  Chief Complaint  Patient presents with  . Back Pain    lower back pain, bilateral pain comes and goes right side is worse.     History of Present Illness: Chase Briggs is a 69 y.o. male who has been referred to clinic today by Lilly Cove, MD for evaluation of back pain.  He has had back pain for several years.  He reports having a procedure for herniated disc approximately 10 years ago, in 2010.  Since then he has had persistent lower back pain, without consistent radiating symptoms into either leg.  His pain is worse on the right side of  his lower back.  Recently has started taking Flexeril to help with his pain.  He is also been doing some exercises at home.  He has not done any formal physiotherapy recently.  He is also had some injections in the past, with limited relief.  Review of Systems: No fevers or chills No bowel or bladder dysfunction No chest pain No shortness of breath  Medical History:  Past Medical History:  Diagnosis Date  . Diabetes mellitus   . Fatty liver   . GERD (gastroesophageal reflux disease)   . HTN (hypertension)   . Hyperlipidemia   . Insomnia     Past Surgical History:  Procedure Laterality Date  . ABDOMINAL AORTOGRAM W/LOWER EXTREMITY N/A 08/22/2018   Procedure: ABDOMINAL AORTOGRAM W/LOWER EXTREMITY;  Surgeon: [name], MD;  Location: [facility] INVASIVE CV LAB;  Service: Cardiovascular;  Laterality: N/A;  . BACK SURGERY    . CARPAL TUNNEL WITH CUBITAL TUNNEL Right 03/21/2019   Procedure: RIGHT CARPAL TUNNEL AND CUBITAL  TUNNEL RELEASES;  Surgeon: [name], MD;  Location: [facility];  Service: Orthopedics;  Laterality: Right;  AXILLARY BLOCK  . COLONOSCOPY  01/2007   Dr. [name], reviewed report, mild diverticulosis. Prep adequate. Next TCS due 01/2017  . ESOPHAGOGASTRODUODENOSCOPY N/A 10/21/2012   ION:[id number] GASTRITIS  . MASS EXCISION Right 10/26/2012   Procedure: EXCISION MASS;  Surgeon: [name], MD;  Location: [facility];  Service:  General;  Laterality: Right;  . PERIPHERAL VASCULAR INTERVENTION  08/22/2018   Procedure: PERIPHERAL VASCULAR INTERVENTION;  Surgeon: [name], MD;  Location: [facility] INVASIVE CV LAB;  Service: Cardiovascular;;  bilateral external iliac stents  . scalp mass  4-5 yrs ago   Dr [name]  . ULNAR NERVE TRANSPOSITION Right 03/21/2019   Procedure: DECOMPRESSION RIGHT ULNAR NERVE;  Surgeon: [name], MD;  Location: [facility];  Service: Orthopedics;  Laterality: Right;    Family History  Problem Relation  Age of Onset  . Diabetes Mother   . Diabetes Father   . Diabetes Brother   . Colon cancer Neg Hx   . Liver disease Neg Hx    Social History   Tobacco Use  . Smoking status: Current Every Day Smoker    Packs/day: 1.00    Years: 35.00    Pack years: 35.00    Types: Cigarettes  . Smokeless tobacco: Never Used  Vaping Use  . Vaping Use: Never used  Substance Use Topics  . Alcohol use: No    Comment: hx heavy etoh x 3 yrs, quit 30+ yrs ago  . Drug use: No    No Known Allergies  Current Meds  Medication Sig  . amLODipine (NORVASC) 10 MG tablet TAKE (1) TABLET BY MOUTH ONCE DAILY.  Marland Kitchen aspirin EC 81 MG tablet Take 81 mg by mouth daily.  Marland Kitchen atorvastatin (LIPITOR) 10 MG tablet TAKE ONE TABLET BY MOUTH ONCE DAILY.  Marland Kitchen Cholecalciferol (VITAMIN D) 125 MCG (5000 UT) CAPS Take 5,000 Units by mouth daily.   . clopidogrel (PLAVIX) 75 MG tablet TAKE (1) TABLET BY MOUTH ONCE DAILY.  . clotrimazole-betamethasone (LOTRISONE) cream Apply 1 application topically 2 (two) times daily.  . cyclobenzaprine (FLEXERIL) 5 MG tablet Take 1 tablet (5 mg total) by mouth 3 (three) times daily as needed for muscle spasms.  . Ferrous Sulfate (IRON) 325 (65 Fe) MG TABS Take 325 mg by mouth daily.  Marland Kitchen glucose blood test strip 1 each by Other route 2 (two) times daily. Use as instructed bid E11.65 Relion Premier  . insulin glargine (LANTUS SOLOSTAR) 100 UNIT/ML Solostar Pen Inject 10 Units into the skin daily with breakfast.  . Insulin Pen Needle (SURE COMFORT PEN NEEDLES) 32G X 4 MM MISC USING ONCE DAILY.  Marland Kitchen Lancets MISC 1 each by Does not apply route 2 (two) times daily. E11.65 Relion Premier  . lisinopril-hydrochlorothiazide (ZESTORETIC) 20-12.5 MG tablet Take 1 tablet by mouth daily.  Marland Kitchen SSD 1 % cream Apply topically daily.  . traZODone (DESYREL) 50 MG tablet TAKE ONE TABLET DAILY AT BEDTIME.    Objective: BP 135/82   Pulse (!) 101   Ht 5\' 7"  (1.702 m)   Wt 59 kg   BMI 20.36 kg/m   Physical Exam: Alert  and oriented, no acute distress. He walks with a slow, steady gait.  2+ patellar tendon reflexes, 1+ Achilles tendon reflex bilaterally Slightly decreased sensation overlying the right pes bursa.  Slight decreased sensation to the great toe on the right foot.  Strength in bilateral lower extremities is 5/5.  Some difficulty getting up from a seated position due to pain.  Negative straight leg raise bilaterally    IMAGING: I personally ordered and reviewed the following images:  X-rays of the lumbar spine were obtained in clinic today demonstrates a loss of curvature within the lumbar region.  Significant degenerative changes are noted, including bridging osteophytes anteriorly.  No orders of the defined types  were placed in this encounter.     Oliver Barre, MD  06/05/2020 12:39 PM

## 2020-06-10 ENCOUNTER — Telehealth: Payer: Self-pay | Admitting: Orthopedic Surgery

## 2020-06-10 ENCOUNTER — Other Ambulatory Visit (INDEPENDENT_AMBULATORY_CARE_PROVIDER_SITE_OTHER): Payer: Self-pay | Admitting: Internal Medicine

## 2020-06-10 DIAGNOSIS — G4700 Insomnia, unspecified: Secondary | ICD-10-CM

## 2020-06-10 DIAGNOSIS — I739 Peripheral vascular disease, unspecified: Secondary | ICD-10-CM

## 2020-06-10 NOTE — Telephone Encounter (Signed)
-----   Message from Oliver Barre, MD sent at 06/10/2020 10:07 AM EDT ----- Regarding: Phone Call Good morning Chase Briggs  We saw [name] in clinic last week.  I told him I would contact his medical doctor to discuss him taking NSAIDs.  His doctor (Dr. Karilyn Cota) said it is ok for him to take these medications for his back pain.  When you get a chance, could you please call Mr. Worth and let him know it is ok for him to take ibuprofen or naproxen, which are both available at the pharmacy.  He should follow the directions on the bottle.    If he would prefer an NSAID prescription medication, I can order him something, just please confirm his preferred pharmacy.  No opioids, just in case he asks.  The prescription would be similar to medications like ibuprofen or naproxen.   Let me know if you have any questions   Loraine Leriche

## 2020-06-10 NOTE — Telephone Encounter (Signed)
I called and spoke with patient wife and she voices understanding. She did not have any questions. Patient will call back with any questions.

## 2020-06-19 ENCOUNTER — Telehealth (HOSPITAL_COMMUNITY): Payer: Self-pay | Admitting: Physical Therapy

## 2020-06-19 NOTE — Telephone Encounter (Signed)
pt cancelled appt because he has to take his wife to [place], he will call back to reschedule

## 2020-06-20 ENCOUNTER — Other Ambulatory Visit (INDEPENDENT_AMBULATORY_CARE_PROVIDER_SITE_OTHER): Payer: Self-pay | Admitting: Internal Medicine

## 2020-06-20 ENCOUNTER — Telehealth (INDEPENDENT_AMBULATORY_CARE_PROVIDER_SITE_OTHER): Payer: Self-pay | Admitting: Internal Medicine

## 2020-06-20 MED ORDER — CLOTRIMAZOLE-BETAMETHASONE 1-0.05 % EX CREA: 1.0000 "application " | TOPICAL_CREAM | Freq: Two times a day (BID) | CUTANEOUS | 0 refills | Status: DC

## 2020-06-20 NOTE — Telephone Encounter (Signed)
Done

## 2020-06-21 ENCOUNTER — Ambulatory Visit (HOSPITAL_COMMUNITY): Payer: Medicare Other | Admitting: Physical Therapy

## 2020-07-03 ENCOUNTER — Other Ambulatory Visit: Payer: Self-pay

## 2020-07-03 ENCOUNTER — Encounter (HOSPITAL_COMMUNITY): Payer: Self-pay | Admitting: Physical Therapy

## 2020-07-03 ENCOUNTER — Ambulatory Visit (HOSPITAL_COMMUNITY): Payer: Medicare Other | Attending: Orthopaedic Surgery | Admitting: Physical Therapy

## 2020-07-03 DIAGNOSIS — M25551 Pain in right hip: Secondary | ICD-10-CM | POA: Diagnosis not present

## 2020-07-03 DIAGNOSIS — M545 Low back pain, unspecified: Secondary | ICD-10-CM | POA: Insufficient documentation

## 2020-07-03 NOTE — Therapy (Signed)
[facility] [address] [place], [place], [address] Phone: [phone]   Fax:  [phone]  Physical Therapy Evaluation  Patient Details  Name: KRESTON AHRENDT MRN: [id number] Date of Birth: [birth year] Referring Provider (PT): Thane Edu MD    Encounter Date: 07/03/2020   PT End of Session - 07/03/20 0930    Visit Number 1    Number of Visits 1    Authorization Type Medicare Part A/ Tricare secondary    PT Start Time 0817    PT Stop Time 0905    PT Time Calculation (min) 48 min    Activity Tolerance Patient tolerated treatment well    Behavior During Therapy [facility] for tasks assessed/performed           Past Medical History:  Diagnosis Date  . Diabetes mellitus   . Fatty liver   . GERD (gastroesophageal reflux disease)   . HTN (hypertension)   . Hyperlipidemia   . Insomnia     Past Surgical History:  Procedure Laterality Date  . ABDOMINAL AORTOGRAM W/LOWER EXTREMITY N/A 08/22/2018   Procedure: ABDOMINAL AORTOGRAM W/LOWER EXTREMITY;  Surgeon: [name], MD;  Location: [facility] INVASIVE CV LAB;  Service: Cardiovascular;  Laterality: N/A;  . BACK SURGERY    . CARPAL TUNNEL WITH CUBITAL TUNNEL Right 03/21/2019   Procedure: RIGHT CARPAL TUNNEL AND CUBITAL  TUNNEL RELEASES;  Surgeon: [name], MD;  Location: [facility];  Service: Orthopedics;  Laterality: Right;  AXILLARY BLOCK  . COLONOSCOPY  01/2007   Dr. [name], reviewed report, mild diverticulosis. Prep adequate. Next TCS due 01/2017  . ESOPHAGOGASTRODUODENOSCOPY N/A 10/21/2012   EVO:[id number] GASTRITIS  . MASS EXCISION Right 10/26/2012   Procedure: EXCISION MASS;  Surgeon: [name], MD;  Location: [facility];  Service: General;  Laterality: Right;  . PERIPHERAL VASCULAR INTERVENTION  08/22/2018   Procedure: PERIPHERAL VASCULAR INTERVENTION;  Surgeon: [name], MD;  Location: [facility] INVASIVE CV LAB;  Service: Cardiovascular;;   bilateral external iliac stents  . scalp mass  4-5 yrs ago   Dr [name]  . ULNAR NERVE TRANSPOSITION Right 03/21/2019   Procedure: DECOMPRESSION RIGHT ULNAR NERVE;  Surgeon: [name], MD;  Location: [facility];  Service: Orthopedics;  Laterality: Right;    There were no vitals filed for this visit.    Subjective Assessment - 07/03/20 0827    Subjective Patient presents to physical therapy with complaint of LBP, RT hip pain. Patient says this has been bothering him for a month. Says he thinks pain began from lifting and moving a hydraulic jack around that he had just bought. Says he went to the MD and had xrays which showed arthritis. He says they told him to take ibuprofen and later his back pain went away. He says it is his RT hip that bothers him most now. Pain overall is not as bad as it was. Says he had therapy for his back before and still ended up needing shots so not sure if therapy helped.    Limitations Lifting;Standing;Walking;House hold activities    Diagnostic tests xrays    Patient Stated Goals "I cant really say"    Currently in Pain? Yes    Pain Score 0-No pain   Reports no pain for 3 weeks             [facility] PT Assessment - 07/03/20 0001      Assessment   Medical Diagnosis LBP  Referring Provider (PT) Thane Edu MD     Onset Date/Surgical Date --   05/2020   Prior Therapy yes      Precautions   Precautions None      Restrictions   Weight Bearing Restrictions No      Balance Screen   Has the patient fallen in the past 6 months No      Home Environment   Living Environment Private residence    Living Arrangements Spouse/significant other      Prior Function   Level of Independence Independent      Cognition   Overall Cognitive Status Within Functional Limits for tasks assessed      Observation/Other Assessments   Observations Noted RT lateral lumbar curvature     Focus on Therapeutic Outcomes (FOTO)  31% limited       ROM /  Strength   AROM / PROM / Strength AROM;Strength      AROM   AROM Assessment Site Lumbar    Lumbar Flexion 10% limited     Lumbar Extension 25% limited     Lumbar - Right Side Bend 10% limited     Lumbar - Left Side Bend WFL     Lumbar - Right Rotation WFL    Lumbar - Left Rotation [facility]       Strength   Strength Assessment Site Hip;Knee    Right/Left Hip Right;Left    Right Hip Flexion 5/5    Right Hip Extension 4+/5    Right Hip ABduction 4+/5    Left Hip Flexion 5/5    Left Hip Extension 4+/5    Left Hip ABduction 5/5    Right/Left Knee Right;Left    Right Knee Extension 5/5    Left Knee Extension 5/5      Ambulation/Gait   Ambulation/Gait Yes    Ambulation/Gait Assistance 7: Independent    Assistive device None    Gait Pattern Lateral trunk lean to right    Ambulation Surface Level;Indoor                      Objective measurements completed on examination: See above findings.       [facility] Adult PT Treatment/Exercise - 07/03/20 0001      Exercises   Exercises Lumbar      Lumbar Exercises: Stretches   Lower Trunk Rotation 5 reps      Lumbar Exercises: Supine   Ab Set 10 reps    Bridge 10 reps    Straight Leg Raise 10 reps                  PT Education - 07/03/20 0827    Education Details on evaluation findings, POC and HEP    Person(s) Educated Patient    Methods Explanation;Handout    Comprehension Verbalized understanding                       Plan - 07/03/20 0931    Clinical Impression Statement Patient is a 69 yo male who presents to physical therapy with complaint of low back and RT hip pain. Patient states he has been asymptomatic for about 3 weeks, but reports history of low back issues. Patient is able to identify inciting mechanisms of injury for most all episodes, and notes that pain typically subsides with rest. Patient reports he is currently able to perform ADLs with little to no limitation, but is just  cautious about "overdoing it".  Patient with mild limitations upon evaluation today. Patient has fairly good AROM, good BLE strength, but does show some postural deficits regarding RT lateral curvature of lumbar spine. Educated patient that this is likely a contributor to RT hip pain that he has felt in the past. Patient agrees to 1 x eval only today, for development of HEP for core and hip strength, as well as gentle lumbar mobility. Patient educated on and issued HEP handout. Patient instructed to follow up with therapy services with any further questions or concerns.    Examination-Activity Limitations Squat;Lift;Stairs    Examination-Participation Restrictions Yard Work;Cleaning;Community Activity    Stability/Clinical Decision Making Stable/Uncomplicated    Clinical Decision Making Low    Rehab Potential Good    PT Frequency 1x / week    PT Duration --   1 x eval only   PT Treatment/Interventions ADLs/Self Care Home Management;Therapeutic exercise;Patient/family education    PT Next Visit Plan 1 X eval only. Issued HEP    PT Home Exercise Plan Eval: ab set, SLR with ab set, bridge, LTR    Consulted and Agree with Plan of Care Patient           Patient will benefit from skilled therapeutic intervention in order to improve the following deficits and impairments:  Improper body mechanics, Decreased range of motion, Postural dysfunction  Visit Diagnosis: Low back pain, unspecified back pain laterality, unspecified chronicity, unspecified whether sciatica present  Pain in right hip     Problem List Patient Active Problem List   Diagnosis Date Noted  . Insomnia 12/12/2019  . Current smoker 08/31/2017  . Mixed hyperlipidemia 08/27/2015  . Vitamin D deficiency 08/27/2015  . Uncontrolled type 2 diabetes mellitus with stage 3 chronic kidney disease, without long-term current use of insulin (HCC) 02/20/2014  . Severe protein-calorie malnutrition (HCC) 02/20/2014  . Fatty liver 09/21/2012   . Weight loss 09/21/2012  . Sternum pain 05/20/2011  . Screen for colon cancer 05/20/2011  . RUQ pain 01/07/2011  . Essential hypertension 01/07/2011    9:41 AM, 07/03/20 Georges Lynch PT DPT  Physical Therapist with [facility]  [facility]  [phone]   [facility] [address] [place], [place], [address] Phone: [phone]   Fax:  [phone]  Name: MILLER LIMEHOUSE MRN: [id number] Date of Birth: [birth year]

## 2020-07-30 ENCOUNTER — Encounter: Payer: Self-pay | Admitting: Cardiology

## 2020-07-30 ENCOUNTER — Other Ambulatory Visit: Payer: Self-pay

## 2020-07-30 ENCOUNTER — Ambulatory Visit (INDEPENDENT_AMBULATORY_CARE_PROVIDER_SITE_OTHER): Payer: Medicare Other | Admitting: Cardiology

## 2020-07-30 VITALS — BP 152/86 | HR 88 | Ht 67.0 in | Wt 135.4 lb

## 2020-07-30 DIAGNOSIS — I10 Essential (primary) hypertension: Secondary | ICD-10-CM

## 2020-07-30 DIAGNOSIS — I739 Peripheral vascular disease, unspecified: Secondary | ICD-10-CM

## 2020-07-30 DIAGNOSIS — R079 Chest pain, unspecified: Secondary | ICD-10-CM

## 2020-07-30 NOTE — Progress Notes (Signed)
Clinical Summary Mr. Chase Briggs is a 69 y.o.male seen as new consult, referred by Dr Chase Briggs for chest pain.   1. Chest pain - occasional left sided pain. Dull pain, 1/10 in severity. Sporadic in frequency, usually with upper body activity. Better with rubbing area. Lasts a few minutes.  - walks daily x 20 -30 minutes, no exertional symptoms  CAD risk factors: HTN, HL, DM2, PAD, tobacco    2. PAD - followed by vascular - prir tesnt to right external iliac, left external iliac - he is on a statin atorva 10mg  daily, LDL was 56 at last check.   3. HTN - compliant with meds - he reports some white coat HTN   Past Medical History:  Diagnosis Date  . Diabetes mellitus   . Fatty liver   . GERD (gastroesophageal reflux disease)   . HTN (hypertension)   . Hyperlipidemia   . Insomnia      No Known Allergies   Current Outpatient Medications  Medication Sig Dispense Refill  . amLODipine (NORVASC) 10 MG tablet TAKE (1) TABLET BY MOUTH ONCE DAILY. 90 tablet 1  . aspirin EC 81 MG tablet Take 81 mg by mouth daily.    atorvastatin (LIPITOR) 10 MG tablet TAKE ONE TABLET BY MOUTH ONCE DAILY. 90 tablet 0  . Cholecalciferol (VITAMIN D) 125 MCG (5000 UT) CAPS Take 5,000 Units by mouth daily.     . clopidogrel (PLAVIX) 75 MG tablet TAKE (1) TABLET BY MOUTH ONCE DAILY. 90 tablet 1  . clotrimazole-betamethasone (LOTRISONE) cream Apply 1 application topically 2 (two) times daily. 30 g 0  . cyclobenzaprine (FLEXERIL) 5 MG tablet Take 1 tablet (5 mg total) by mouth 3 (three) times daily as needed for muscle spasms. 30 tablet 1  . Ferrous Sulfate (IRON) 325 (65 Fe) MG TABS Take 325 mg by mouth daily.    Marland Kitchen glucose blood test strip 1 each by Other route 2 (two) times daily. Use as instructed bid E11.65 Relion Premier 100 each 5  . insulin glargine (LANTUS SOLOSTAR) 100 UNIT/ML Solostar Pen Inject 10 Units into the skin daily with breakfast. 10 mL 2  . Insulin Pen Needle (SURE COMFORT PEN  NEEDLES) 32G X 4 MM MISC USING ONCE DAILY. 100 each 3  . Lancets MISC 1 each by Does not apply route 2 (two) times daily. E11.65 Relion Premier 100 each 5  . lisinopril-hydrochlorothiazide (ZESTORETIC) 20-12.5 MG tablet Take 1 tablet by mouth daily. 90 tablet 3  . SSD 1 % cream Apply topically daily.    . traZODone (DESYREL) 50 MG tablet TAKE ONE TABLET DAILY AT BEDTIME. 90 tablet 0   No current facility-administered medications for this visit.     Past Surgical History:  Procedure Laterality Date  . ABDOMINAL AORTOGRAM W/LOWER EXTREMITY N/A 08/22/2018   Procedure: ABDOMINAL AORTOGRAM W/LOWER EXTREMITY;  Surgeon: 14/05/2018, MD;  Location: [facility] INVASIVE CV LAB;  Service: Cardiovascular;  Laterality: N/A;  . BACK SURGERY    . CARPAL TUNNEL WITH CUBITAL TUNNEL Right 03/21/2019   Procedure: RIGHT CARPAL TUNNEL AND CUBITAL  TUNNEL RELEASES;  Surgeon: 05/22/2019, MD;  Location: [facility];  Service: Orthopedics;  Laterality: Right;  AXILLARY BLOCK  . COLONOSCOPY  01/2007   Dr. 02/2007, reviewed report, mild diverticulosis. Prep adequate. Next TCS due 01/2017  . ESOPHAGOGASTRODUODENOSCOPY N/A 10/21/2012   12/19/2012 GASTRITIS  . MASS EXCISION Right 10/26/2012   Procedure: EXCISION MASS;  Surgeon: 12/24/2012, MD;  Location: [facility];  Service: General;  Laterality: Right;  . PERIPHERAL VASCULAR INTERVENTION  08/22/2018   Procedure: PERIPHERAL VASCULAR INTERVENTION;  Surgeon: Chase Harman, MD;  Location: [facility]VASIVE CV LAB;  Service: Cardiovascular;;  bilateral external iliac stents  . scalp mass  4-5 yrs ago   Dr Chase Briggs  . ULNAR NERVE TRANSPOSITION Right 03/21/2019   Procedure: DECOMPRESSION RIGHT ULNAR NERVE;  Surgeon: Chase Salt, MD;  Location: [facility];  Service: Orthopedics;  Laterality: Right;     No Known Allergies    Family History  Problem Relation Age of Onset  . Diabetes Mother   . Diabetes Father   . Diabetes Brother     . Colon cancer Neg Hx   . Liver disease Neg Hx      Social History Mr. Chase Briggs reports that he has been smoking cigarettes. He has a 35.00 pack-year smoking history. He has never used smokeless tobacco. Mr. Chase Briggs reports no history of alcohol use.   Review of Systems CONSTITUTIONAL: No weight loss, fever, chills, weakness or fatigue.  HEENT: Eyes: No visual loss, blurred vision, double vision or yellow sclerae.No hearing loss, sneezing, congestion, runny nose or sore throat.  SKIN: No rash or itching.  CARDIOVASCULAR: per hpi RESPIRATORY: No shortness of breath, cough or sputum.  GASTROINTESTINAL: No anorexia, nausea, vomiting or diarrhea. No abdominal pain or blood.  GENITOURINARY: No burning on urination, no polyuria NEUROLOGICAL: No headache, dizziness, syncope, paralysis, ataxia, numbness or tingling in the extremities. No change in bowel or bladder control.  MUSCULOSKELETAL: No muscle, back pain, joint pain or stiffness.  LYMPHATICS: No enlarged nodes. No history of splenectomy.  PSYCHIATRIC: No history of depression or anxiety.  ENDOCRINOLOGIC: No reports of sweating, cold or heat intolerance. No polyuria or polydipsia.  Marland Kitchen   Physical Examination Today's Vitals   07/30/20 1029  BP: (!) 152/86  Pulse: 88  SpO2: 99%  Weight: 135 lb 6.4 oz (61.4 kg)  Height: 5\' 7"  (1.702 m)   Body mass index is 21.21 kg/m.  Gen: resting comfortably, no acute distress HEENT: no scleral icterus, pupils equal round and reactive, no palptable cervical adenopathy,  CV: RRR, no m/r/g no jvd Resp: Clear to auscultation bilaterally GI: abdomen is soft, non-tender, non-distended, normal bowel sounds, no hepatosplenomegaly MSK: extremities are warm, no edema.  Skin: warm, no rash Neuro:  no focal deficits Psych: appropriate affect     Assessment and Plan  1. Chest pain -noncardiac symptoms. He does have multiple CAD risk factors as well as PAD, however with noncardaic symptoms  would not plan for ischemic testing at this time. - continue risk factor modification, medical therapy for his PAD is also appropriate for cardiac protection - EKG today shows SR, PACs,   2. HTN - elevated today, he reports some white coat HTN - will arrange a home bp cuff through [place] to obtain some home data   3. PAD  - no symptoms, continue medical therapy.     , M.D

## 2020-07-30 NOTE — Patient Instructions (Signed)
Medication Instructions:  Your physician recommends that you continue on your current medications as directed. Please refer to the Current Medication list given to you today.  *If you need a refill on your cardiac medications before your next appointment, please call your pharmacy*   Lab Work: NONE TODAY If you have labs (blood work) drawn today and your tests are completely normal, you will receive your results only by: . MyChart Message (if you have MyChart) OR . A paper copy in the mail If you have any lab test that is abnormal or we need to change your treatment, we will call you to review the results.   Testing/Procedures: NONE TODAY   Follow-Up: At [facility], you and your health needs are our priority.  As part of our continuing mission to provide you with exceptional heart care, we have created designated Provider Care Teams.  These Care Teams include your primary Cardiologist (physician) and Advanced Practice Providers (APPs -  Physician Assistants and Nurse Practitioners) who all work together to provide you with the care you need, when you need it.  We recommend signing up for the patient portal called "MyChart".  Sign up information is provided on this After Visit Summary.  MyChart is used to connect with patients for Virtual Visits (Telemedicine).  Patients are able to view lab/test results, encounter notes, upcoming appointments, etc.  Non-urgent messages can be sent to your provider as well.   To learn more about what you can do with MyChart, go to [email or web address].    Your next appointment:   6 month(s)  The format for your next appointment:   In Person  Provider:   Jonathan Branch, MD   Other Instructions NONE      Thank you for choosing [facility] !         

## 2020-07-31 ENCOUNTER — Encounter (INDEPENDENT_AMBULATORY_CARE_PROVIDER_SITE_OTHER): Payer: Self-pay | Admitting: Internal Medicine

## 2020-07-31 ENCOUNTER — Ambulatory Visit (INDEPENDENT_AMBULATORY_CARE_PROVIDER_SITE_OTHER): Payer: Medicare Other | Admitting: Internal Medicine

## 2020-07-31 VITALS — BP 112/78 | HR 50 | Temp 97.1°F | Ht 67.0 in | Wt 136.4 lb

## 2020-07-31 DIAGNOSIS — E559 Vitamin D deficiency, unspecified: Secondary | ICD-10-CM

## 2020-07-31 DIAGNOSIS — R079 Chest pain, unspecified: Secondary | ICD-10-CM | POA: Diagnosis not present

## 2020-07-31 DIAGNOSIS — I10 Essential (primary) hypertension: Secondary | ICD-10-CM

## 2020-07-31 DIAGNOSIS — E782 Mixed hyperlipidemia: Secondary | ICD-10-CM

## 2020-07-31 NOTE — Progress Notes (Signed)
Metrics: Intervention Frequency ACO  Documented Smoking Status Yearly  Screened one or more times in 24 months  Cessation Counseling or  Active cessation medication Past 24 months  Past 24 months   Guideline developer: UpToDate (See UpToDate for funding source) Date Released: 2014       Wellness Office Visit  Subjective:  Patient ID: Chase Briggs, male    DOB: 08-Feb-1951  Age: 69 y.o. MRN: [id number]  CC: This man comes in for follow-up of hypertension, hyperlipidemia in the face of diabetes, diabetes, fatty liver disease. HPI  He does have a history of peripheral vascular disease and describes cold feet.  This is not a new symptom but has been going on for some time but is not getting worse. He also saw the orthopedic physician for his back pain and is now undergoing physical therapy.  Hopefully this will help him. He did see cardiology yesterday for chest pain and it was felt that no further investigations were necessary. Unfortunately, he continues to smoke cigarettes although he is reduce the amount. Past Medical History:  Diagnosis Date  . Diabetes mellitus   . Fatty liver   . GERD (gastroesophageal reflux disease)   . HTN (hypertension)   . Hyperlipidemia   . Insomnia    Past Surgical History:  Procedure Laterality Date  . ABDOMINAL AORTOGRAM W/LOWER EXTREMITY N/A 08/22/2018   Procedure: ABDOMINAL AORTOGRAM W/LOWER EXTREMITY;  Surgeon: [name], MD;  Location: [facility] INVASIVE CV LAB;  Service: Cardiovascular;  Laterality: N/A;  . BACK SURGERY    . CARPAL TUNNEL WITH CUBITAL TUNNEL Right 03/21/2019   Procedure: RIGHT CARPAL TUNNEL AND CUBITAL  TUNNEL RELEASES;  Surgeon: [name], MD;  Location: [facility];  Service: Orthopedics;  Laterality: Right;  AXILLARY BLOCK  . COLONOSCOPY  01/2007   Dr. [name], reviewed report, mild diverticulosis. Prep adequate. Next TCS due 01/2017  . ESOPHAGOGASTRODUODENOSCOPY N/A 10/21/2012   [id number]  GASTRITIS  . MASS EXCISION Right 10/26/2012   Procedure: EXCISION MASS;  Surgeon: [name], MD;  Location: [facility];  Service: General;  Laterality: Right;  . PERIPHERAL VASCULAR INTERVENTION  08/22/2018   Procedure: PERIPHERAL VASCULAR INTERVENTION;  Surgeon: [name], MD;  Location: [facility] INVASIVE CV LAB;  Service: Cardiovascular;;  bilateral external iliac stents  . scalp mass  4-5 yrs ago   Dr [name]  . ULNAR NERVE TRANSPOSITION Right 03/21/2019   Procedure: DECOMPRESSION RIGHT ULNAR NERVE;  Surgeon: [name], MD;  Location: [facility];  Service: Orthopedics;  Laterality: Right;     Family History  Problem Relation Age of Onset  . Diabetes Mother   . Diabetes Father   . Diabetes Brother   . Colon cancer Neg Hx   . 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 Married for 12 years. Has 2 sons (33 and 21). Lives with wife.   Social History   Tobacco Use  . Smoking status: Current Every Day Smoker    Packs/day: 1.00    Years: 35.00    Pack years: 35.00    Types: Cigarettes  . Smokeless tobacco: Never Used  Substance Use Topics  . 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 amLODipine (NORVASC) 10 MG tablet TAKE (1)  TABLET BY MOUTH ONCE DAILY.  Marland Kitchen aspirin EC 81 MG tablet Take 81 mg by mouth daily.  Marland Kitchen atorvastatin (LIPITOR) 10 MG tablet TAKE ONE TABLET BY MOUTH ONCE DAILY.  Marland Kitchen Cholecalciferol (VITAMIN D) 125 MCG (5000 UT) CAPS Take 5,000 Units by mouth daily.   . cloNIDine (CATAPRES) 0.3 MG tablet Take 0.3 mg by mouth at bedtime.  . clopidogrel (PLAVIX) 75 MG tablet TAKE (1) TABLET BY MOUTH ONCE DAILY.  . clotrimazole-betamethasone (LOTRISONE) cream Apply 1 application topically 2 (two) times daily.  . cyclobenzaprine (FLEXERIL) 5 MG tablet Take 1 tablet (5 mg total) by mouth 3  (three) times daily as needed for muscle spasms.  . Ferrous Sulfate (IRON) 325 (65 Fe) MG TABS Take 325 mg by mouth daily.  Marland Kitchen glucose blood test strip 1 each by Other route 2 (two) times daily. Use as instructed bid E11.65 Relion Premier  . insulin glargine (LANTUS SOLOSTAR) 100 UNIT/ML Solostar Pen Inject 10 Units into the skin daily with breakfast.  . Insulin Pen Needle (SURE COMFORT PEN NEEDLES) 32G X 4 MM MISC USING ONCE DAILY.  Marland Kitchen Lancets MISC 1 each by Does not apply route 2 (two) times daily. E11.65 Relion Premier  . lisinopril-hydrochlorothiazide (ZESTORETIC) 20-12.5 MG tablet Take 1 tablet by mouth daily.  Marland Kitchen SSD 1 % cream Apply topically daily.  . traZODone (DESYREL) 50 MG tablet TAKE ONE TABLET DAILY AT BEDTIME.      Depression screen [facility] 2/9 07/31/2020 12/12/2019 11/14/2019 08/31/2017 05/31/2017  Decreased Interest 0 0 0 0 0  Down, Depressed, Hopeless 0 0 0 0 0  PHQ - 2 Score 0 0 0 0 0  Altered sleeping 0 - - - -  Tired, decreased energy 0 - - - -  Change in appetite 0 - - - -  Feeling bad or failure about yourself  0 - - - -  Trouble concentrating 0 - - - -  Moving slowly or fidgety/restless 0 - - - -  Suicidal thoughts 0 - - - -  PHQ-9 Score 0 - - - -  Difficult doing work/chores Not difficult at all - - - -     Objective:   Today's Vitals: BP 112/78   Pulse (!) 50   Temp (!) 97.1 F (36.2 C) (Temporal)   Ht 5\' 7"  (1.702 m)   Wt 136 lb 6.4 oz (61.9 kg)   SpO2 96%   BMI 21.36 kg/m  Vitals with BMI 07/31/2020 07/30/2020 06/05/2020  Height 5\' 7"  5\' 7"  5\' 7"   Weight 136 lbs 6 oz 135 lbs 6 oz 130 lbs  BMI 21.36 21.2 20.36  Systolic 112 152 06/07/2020  Diastolic 78 86 82  Pulse 50 88 101     Physical Exam   He looks systemically well.  Blood pressure is excellent.  Weight is stable.  No other new physical findings today.    Assessment   1. Essential hypertension   2. Chest pain, unspecified type   3. Mixed hyperlipidemia   4. Vitamin D deficiency        Tests ordered No orders of the defined types were placed in this encounter.    Plan: 1. He will continue with amlodipine and Zestoretic for his hypertension.  This seems to be keeping his blood pressure under good control. 2. He will continue with statin therapy for hyperlipidemia. 3. He will continue with vitamin D3 supplementation for vitamin D deficiency. 4. He will follow up with endocrinology regarding his diabetes. 5. I  recommended he get COVID-19 booster dose towards the end of November when he is due. 6. Follow-up with Maralyn Sago as previously scheduled in March of next year for an annual Medicare wellness visit.   No orders of the defined types were placed in this encounter.   [name], MD

## 2020-08-10 ENCOUNTER — Other Ambulatory Visit (INDEPENDENT_AMBULATORY_CARE_PROVIDER_SITE_OTHER): Payer: Self-pay | Admitting: Nurse Practitioner

## 2020-08-10 DIAGNOSIS — I10 Essential (primary) hypertension: Secondary | ICD-10-CM

## 2020-08-14 ENCOUNTER — Ambulatory Visit (INDEPENDENT_AMBULATORY_CARE_PROVIDER_SITE_OTHER): Payer: Medicare Other | Admitting: Urology

## 2020-08-14 ENCOUNTER — Encounter: Payer: Self-pay | Admitting: Urology

## 2020-08-14 ENCOUNTER — Other Ambulatory Visit: Payer: Self-pay

## 2020-08-14 VITALS — BP 118/81 | HR 97 | Temp 98.0°F | Wt 136.0 lb

## 2020-08-14 DIAGNOSIS — N489 Disorder of penis, unspecified: Secondary | ICD-10-CM | POA: Diagnosis not present

## 2020-08-14 LAB — URINALYSIS, ROUTINE W REFLEX MICROSCOPIC
Bilirubin, UA: NEGATIVE
Glucose, UA: NEGATIVE
Ketones, UA: NEGATIVE
Leukocytes,UA: NEGATIVE
Nitrite, UA: NEGATIVE
RBC, UA: NEGATIVE
Specific Gravity, UA: 1.015 (ref 1.005–1.030)
Urobilinogen, Ur: 2 mg/dL — ABNORMAL HIGH (ref 0.2–1.0)
pH, UA: 8.5 — ABNORMAL HIGH (ref 5.0–7.5)

## 2020-08-14 LAB — MICROSCOPIC EXAMINATION
Bacteria, UA: NONE SEEN
Epithelial Cells (non renal): NONE SEEN /hpf (ref 0–10)
RBC, Urine: NONE SEEN /hpf (ref 0–2)
Renal Epithel, UA: NONE SEEN /hpf
WBC, UA: NONE SEEN /hpf (ref 0–5)

## 2020-08-14 MED ORDER — NYSTATIN-TRIAMCINOLONE 100000-0.1 UNIT/GM-% EX OINT: 1.0000 "application " | TOPICAL_OINTMENT | Freq: Two times a day (BID) | CUTANEOUS | 0 refills | Status: DC

## 2020-08-14 NOTE — Progress Notes (Signed)
Urological Symptom Review  Patient is experiencing the following symptoms: Hard to postpone urination Weak stream   Review of Systems  Gastrointestinal (upper)  : Negative for upper GI symptoms  Gastrointestinal (lower) : Negative for lower GI symptoms  Constitutional : Weight loss  Skin: Negative for skin symptoms  Eyes: Negative for eye symptoms  Ear/Nose/Throat : Sinus problems  Hematologic/Lymphatic: Negative for Hematologic/Lymphatic symptoms  Cardiovascular : Negative for cardiovascular symptoms  Respiratory : Negative for respiratory symptoms  Endocrine: Negative for endocrine symptoms  Musculoskeletal: Back pain  Neurological: Negative for neurological symptoms  Psychologic: Negative for psychiatric symptoms

## 2020-08-14 NOTE — Progress Notes (Signed)
08/14/2020 9:44 AM   Chase Briggs [birth year] [id number]  Referring provider: [name], MD [address] [place],  [place] [address]  Penile pain  HPI: Mr Minehart is a 69yo here for followup for penile lesion/irritation. Last visit he was started on clotrimazole BID for 21 days which did improve the irration of his foreskin and glans. AFter stopping the cream the irritation returned. He has pain with retracting his foreskin. NO issues urinating.    PMH: Past Medical History:  Diagnosis Date  . Diabetes mellitus   . Fatty liver   . GERD (gastroesophageal reflux disease)   . HTN (hypertension)   . Hyperlipidemia   . Insomnia     Surgical History: Past Surgical History:  Procedure Laterality Date  . ABDOMINAL AORTOGRAM W/LOWER EXTREMITY N/A 08/22/2018   Procedure: ABDOMINAL AORTOGRAM W/LOWER EXTREMITY;  Surgeon: [name], MD;  Location: [facility] INVASIVE CV LAB;  Service: Cardiovascular;  Laterality: N/A;  . BACK SURGERY    . CARPAL TUNNEL WITH CUBITAL TUNNEL Right 03/21/2019   Procedure: RIGHT CARPAL TUNNEL AND CUBITAL  TUNNEL RELEASES;  Surgeon: [name], MD;  Location: [facility];  Service: Orthopedics;  Laterality: Right;  AXILLARY BLOCK  . COLONOSCOPY  01/2007   Dr. [name], reviewed report, mild diverticulosis. Prep adequate. Next TCS due 01/2017  . ESOPHAGOGASTRODUODENOSCOPY N/A 10/21/2012   [id number] GASTRITIS  . MASS EXCISION Right 10/26/2012   Procedure: EXCISION MASS;  Surgeon: [name], MD;  Location: [facility];  Service: General;  Laterality: Right;  . PERIPHERAL VASCULAR INTERVENTION  08/22/2018   Procedure: PERIPHERAL VASCULAR INTERVENTION;  Surgeon: [name], MD;  Location: [facility]VASIVE CV LAB;  Service: Cardiovascular;;  bilateral external iliac stents  . scalp mass  4-5 yrs ago   Dr [name]  . ULNAR NERVE TRANSPOSITION Right 03/21/2019   Procedure: DECOMPRESSION RIGHT ULNAR NERVE;  Surgeon: [name], MD;  Location: [facility];  Service: Orthopedics;  Laterality: Right;    Home Medications:  Allergies as of 08/14/2020   No Known Allergies     Medication List       Accurate as of August 14, 2020  9:44 AM. If you have any questions, ask your nurse or doctor.        amLODipine 10 MG tablet Commonly known as: NORVASC TAKE (1) TABLET BY MOUTH ONCE DAILY.   aspirin EC 81 MG tablet Take 81 mg by mouth daily.   atorvastatin 10 MG tablet Commonly known as: LIPITOR TAKE ONE TABLET BY MOUTH ONCE DAILY.   cloNIDine 0.3 MG tablet Commonly known as: CATAPRES Take 0.3 mg by mouth at bedtime.   clopidogrel 75 MG tablet Commonly known as: PLAVIX TAKE (1) TABLET BY MOUTH ONCE DAILY.   clotrimazole-betamethasone cream Commonly known as: Lotrisone Apply 1 application topically 2 (two) times daily.   cyclobenzaprine 5 MG tablet Commonly known as: FLEXERIL Take 1 tablet (5 mg total) by mouth 3 (three) times daily as needed for muscle spasms.   glucose blood test strip 1 each by Other route 2 (two) times daily. Use as instructed bid E11.65 Relion Premier   Iron 325 (65 Fe) MG Tabs Take 325 mg by mouth daily.   Lancets Misc 1 each by Does not apply route 2 (two) times daily. E11.65 Relion Premier   Lantus SoloStar 100 UNIT/ML Solostar Pen Generic drug: insulin glargine Inject 10 Units into the skin daily with breakfast.   lisinopril-hydrochlorothiazide 20-12.5 MG tablet Commonly  known as: ZESTORETIC TAKE ONE TABLET BY MOUTH ONCE DAILY.   SSD 1 % cream Generic drug: silver sulfADIAZINE Apply topically daily.   Sure Comfort Pen Needles 32G X 4 MM Misc Generic drug: Insulin Pen Needle USING ONCE DAILY.   traZODone 50 MG tablet Commonly known as: DESYREL TAKE ONE TABLET DAILY AT BEDTIME.   Vitamin D 125 MCG (5000 UT) Caps Take 5,000 Units by mouth daily.       Allergies: No Known Allergies  Family History: Family History  Problem Relation  Age of Onset  . Diabetes Mother   . Diabetes Father   . Diabetes Brother   . Colon cancer Neg Hx   . Liver disease Neg Hx     Social History:  reports that he has been smoking cigarettes. He has a 35.00 pack-year smoking history. He has never used smokeless tobacco. He reports that he does not drink alcohol and does not use drugs.  ROS: All other review of systems were reviewed and are negative except what is noted above in HPI  Physical Exam: BP 118/81   Pulse 97   Temp 98 F (36.7 C)   Wt 136 lb (61.7 kg)   BMI 21.30 kg/m   Constitutional:  Alert and oriented, No acute distress. HEENT: [place] AT, moist mucus membranes.  Trachea midline, no masses. Cardiovascular: No clubbing, cyanosis, or edema. Respiratory: Normal respiratory effort, no increased work of breathing. GI: Abdomen is soft, nontender, nondistended, no abdominal masses GU: No CVA tenderness. Circumcised phallus. No masses/lesions on penis, testis, scrotum. Prostate 40g smooth no nodules no induration.  Lymph: No cervical or inguinal lymphadenopathy. Skin: No rashes, bruises or suspicious lesions. Neurologic: Grossly intact, no focal deficits, moving all 4 extremities. Psychiatric: Normal mood and affect.  Laboratory Data: Lab Results  Component Value Date   WBC 7.2 09/16/2018   HGB 12.8 (L) 09/16/2018   HCT 39.5 09/16/2018   MCV 94.5 09/16/2018   PLT 202 09/16/2018    Lab Results  Component Value Date   CREATININE 1.34 (H) 03/28/2020    No results found for: PSA  No results found for: TESTOSTERONE  Lab Results  Component Value Date   HGBA1C 6.8 (A) 04/23/2020    Urinalysis    Component Value Date/Time   COLORURINE STRAW (A) 09/16/2018 0851   APPEARANCEUR Clear 05/21/2020 1611   LABSPEC 1.005 09/16/2018 0851   PHURINE 6.0 09/16/2018 0851   GLUCOSEU 2+ (A) 05/21/2020 1611   HGBUR SMALL (A) 09/16/2018 0851   BILIRUBINUR Negative 05/21/2020 1611   KETONESUR NEGATIVE 09/16/2018 0851   PROTEINUR  Trace (A) 05/21/2020 1611   PROTEINUR NEGATIVE 09/16/2018 0851   UROBILINOGEN 0.2 02/19/2014 1340   NITRITE Negative 05/21/2020 1611   NITRITE NEGATIVE 09/16/2018 0851   LEUKOCYTESUR Negative 05/21/2020 1611    Lab Results  Component Value Date   LABMICR Comment 05/21/2020   BACTERIA RARE (A) 09/16/2018    Pertinent Imaging:  No results found for this or any previous visit.  No results found for this or any previous visit.  No results found for this or any previous visit.  No results found for this or any previous visit.  No results found for this or any previous visit.  No results found for this or any previous visit.  No results found for this or any previous visit.  No results found for this or any previous visit.   Assessment & Plan:    1. Penile lesion -nystatin ointment BID for 6  weeks. If this fails to improve his balanitis than we will likely proceed with circumcision. - Urinalysis, Routine w reflex microscopic   No follow-ups on file.  Wilkie Aye, MD  [facility] [place]

## 2020-08-14 NOTE — Patient Instructions (Signed)
Circumcision, Adult  Circumcision is a surgery to remove the foreskin of the penis or to cut the foreskin so the space between skin and the tip of the penis is larger. When only the foreskin is cut, it is called a dorsal incision. A dorsal circumcision leaves the entire foreskin but makes the end of the foreskin looser so it can be pulled back over the head of the penis. Tell a health care provider about:  Any allergies you have.  All medicines you are taking, including vitamins, herbs, eye drops, creams, and over-the-counter medicines.  Any problems you or family members have had with anesthetic medicines.  Any blood disorders you have.  Any surgeries you have had.  Any medical conditions you have, including a cold or other infection. What are the risks? Generally, this is a safe procedure. However, problems may occur, including:  Bleeding.  Infection.  Pain.  Urethral injury. The urethra is a tube that ends at the tip of the penis and carries urine out of your body.  Opening of the surgical wound. This can occur from an unwanted erection after surgery.  Allergic reactions to medicines. What happens before the procedure? Staying hydrated Follow instructions from your health care provider about hydration, which may include:  Up to 2 hours before the procedure - you may continue to drink clear liquids, such as water, clear fruit juice, black coffee, and plain tea. Eating and drinking restrictions Follow instructions from your health care provider about eating and drinking, which may include:  8 hours before the procedure - stop eating heavy meals or foods such as meat, fried foods, or fatty foods.  6 hours before the procedure - stop eating light meals or foods, such as toast or cereal.  6 hours before the procedure - stop drinking milk or drinks that contain milk.  2 hours before the procedure - stop drinking clear liquids. Medicines  Ask your health care provider  about: ? Changing or stopping your regular medicines. This is especially important if you take diabetes medicines or blood thinners. ? Taking medicines such as aspirin and ibuprofen. These medicines can thin your blood. Do not take these medicines before your procedure if your doctor instructs you not to.  You may be given antibiotic medicine to help prevent infection. General instructions  If you will be going home right after the procedure, plan to have someone with you for 24 hours.  Plan to have someone take you home from the hospital or clinic.  Ask your health care provider how your surgical site will be marked or identified.  You may be asked to shower with a germ-killing soap. What happens during the procedure?  To reduce your risk of infection: ? Your health care team will wash or sanitize their hands. ? Your skin will be washed with soap.  An IV tube may be inserted into one of your veins.  You may be given medicine to help you relax (sedative).  An anesthetic will be injected with a needle into the skin of your penis (local anesthetic) to numb the nerves of your foreskin.  An incision will be made to remove or cut the foreskin.  Absorbable stitches (sutures) may be used to close the incision.  A bandage (dressing) will be applied to the incision site. The procedure may vary among health care providers and hospitals. What happens after the procedure?  Your blood pressure, heart rate, breathing rate, and blood oxygen level will be monitored until the medicines   you were given have worn off.  Do not get out of bed until your health care provider approves.  Do not drive for 24 hours after the procedure if you were given a sedative. Summary  Circumcision is a surgery to remove the foreskin of the penis or to cut the foreskin so the opening is larger.  Absorbable sutures may be used to close the incision after the foreskin has been removed or cut.  If you will be  going home right after the procedure, plan to have someone with you for 24 hours. This information is not intended to replace advice given to you by your health care provider. Make sure you discuss any questions you have with your health care provider. Document Revised: 08/13/2017 Document Reviewed: 07/20/2016 Elsevier Patient Education  2020 [employer] Inc[employer]  

## 2020-08-20 ENCOUNTER — Other Ambulatory Visit (INDEPENDENT_AMBULATORY_CARE_PROVIDER_SITE_OTHER): Payer: Self-pay | Admitting: Internal Medicine

## 2020-08-23 ENCOUNTER — Ambulatory Visit (INDEPENDENT_AMBULATORY_CARE_PROVIDER_SITE_OTHER): Payer: Medicare Other | Admitting: "Endocrinology

## 2020-08-23 ENCOUNTER — Other Ambulatory Visit: Payer: Self-pay

## 2020-08-23 ENCOUNTER — Encounter: Payer: Self-pay | Admitting: "Endocrinology

## 2020-08-23 VITALS — BP 161/77 | HR 90 | Ht 67.0 in | Wt 137.5 lb

## 2020-08-23 DIAGNOSIS — E782 Mixed hyperlipidemia: Secondary | ICD-10-CM | POA: Diagnosis not present

## 2020-08-23 DIAGNOSIS — N182 Chronic kidney disease, stage 2 (mild): Secondary | ICD-10-CM

## 2020-08-23 DIAGNOSIS — E1122 Type 2 diabetes mellitus with diabetic chronic kidney disease: Secondary | ICD-10-CM | POA: Diagnosis not present

## 2020-08-23 DIAGNOSIS — IMO0002 Reserved for concepts with insufficient information to code with codable children: Secondary | ICD-10-CM

## 2020-08-23 DIAGNOSIS — I10 Essential (primary) hypertension: Secondary | ICD-10-CM | POA: Diagnosis not present

## 2020-08-23 DIAGNOSIS — E1165 Type 2 diabetes mellitus with hyperglycemia: Secondary | ICD-10-CM | POA: Diagnosis not present

## 2020-08-23 DIAGNOSIS — Z794 Long term (current) use of insulin: Secondary | ICD-10-CM | POA: Diagnosis not present

## 2020-08-23 LAB — POCT UA - MICROALBUMIN
Creatinine, POC: 300 mg/dL
Microalbumin Ur, POC: 150 mg/L

## 2020-08-23 LAB — POCT GLYCOSYLATED HEMOGLOBIN (HGB A1C): HbA1c, POC (controlled diabetic range): 7.5 % — AB (ref 0.0–7.0)

## 2020-08-23 MED ORDER — LANTUS SOLOSTAR 100 UNIT/ML ~~LOC~~ SOPN: 14.0000 [IU] | PEN_INJECTOR | Freq: Every day | SUBCUTANEOUS | 1 refills | Status: DC

## 2020-08-23 MED ORDER — LISINOPRIL-HYDROCHLOROTHIAZIDE 20-12.5 MG PO TABS: 1.0000 | ORAL_TABLET | Freq: Every day | ORAL | 1 refills | Status: DC

## 2020-08-23 NOTE — Progress Notes (Signed)
08/23/2020   Endocrinology follow-up note    Subjective:    Patient ID: Chase Briggs, male    DOB: 1950-11-12, PCP Wilson SingerGosrani, Nimish C, MD   Past Medical History:  Diagnosis Date  . Diabetes mellitus   . Fatty liver   . GERD (gastroesophageal reflux disease)   . HTN (hypertension)   . Hyperlipidemia   . Insomnia    Past Surgical History:  Procedure Laterality Date  . ABDOMINAL AORTOGRAM W/LOWER EXTREMITY N/A 08/22/2018   Procedure: ABDOMINAL AORTOGRAM W/LOWER EXTREMITY;  Surgeon: Maeola Harmanain, Brandon Christopher, MD;  [facility];  Service: Cardiovascular;  Laterality: N/A;  . BACK SURGERY    . CARPAL TUNNEL WITH CUBITAL TUNNEL Right 03/21/2019   Procedure: RIGHT CARPAL TUNNEL AND CUBITAL  TUNNEL RELEASES;  Surgeon: Cindee SaltKuzma, Gary, MD;  Location: [facility];  Service: Orthopedics;  Laterality: Right;  AXILLARY BLOCK  . COLONOSCOPY  01/2007   Dr. Lovell SheehanJenkins, reviewed report, mild diverticulosis. Prep adequate. Next TCS due 01/2017  . ESOPHAGOGASTRODUODENOSCOPY N/A 10/21/2012   ZOX:[id number]:MODERATE GASTRITIS  . MASS EXCISION Right 10/26/2012   Procedure: EXCISION MASS;  Surgeon: Dalia HeadingMark A Jenkins, MD;  Location: [facility];  Service: General;  Laterality: Right;  . PERIPHERAL VASCULAR INTERVENTION  08/22/2018   Procedure: PERIPHERAL VASCULAR INTERVENTION;  Surgeon: Maeola Harmanain, Brandon Christopher, MD;  [facility];  Service: Cardiovascular;;  bilateral external iliac stents  . scalp mass  4-5 yrs ago   Dr Lovell SheehanJenkins  . ULNAR NERVE TRANSPOSITION Right 03/21/2019   Procedure: DECOMPRESSION RIGHT ULNAR NERVE;  Surgeon: Cindee SaltKuzma, Gary, MD;  Location: [facility];  Service: Orthopedics;  Laterality: Right;   Social History   Socioeconomic History  . Marital status: Married    Spouse name: Not on file  . Number of children: 2  . Years of education: Not on file  . Highest education level: Not on file  Occupational History  . Occupation: reitred     Comment: [employer], heavy lifting  Tobacco Use  . Smoking status: Current Every Day Smoker    Packs/day: 1.00    Years: 35.00    Pack years: 35.00    Types: Cigarettes  . Smokeless tobacco: Never Used  Vaping Use  . Vaping Use: Never used  Substance and Sexual Activity  . Alcohol use: No    Comment: hx heavy etoh x 3 yrs, quit 30+ yrs ago  . Drug use: No  . Sexual activity: Yes    Birth control/protection: None  Other Topics Concern  . 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 Highest level of education: 12th grade. Married for 12 years. Has 2 sons (33 and 21). Lives with wife.   Social Determinants of Health   Financial Resource Strain: Not on file  Food Insecurity: Not on file  Transportation Needs: Not on file  Physical Activity: Not on file  Stress: Not on file  Social Connections: Not on file   Outpatient Encounter Medications as of 08/23/2020  Medication Sig  . amLODipine (NORVASC) 10 MG tablet TAKE (1) TABLET BY MOUTH ONCE DAILY.  Marland Kitchen. aspirin EC 81 MG tablet Take 81 mg by mouth daily.  Marland Kitchen. atorvastatin (LIPITOR) 10 MG tablet TAKE ONE TABLET BY MOUTH ONCE DAILY.  Marland Kitchen. Cholecalciferol (VITAMIN D) 125 MCG (5000 UT) CAPS Take 5,000 Units by mouth daily.   . cloNIDine (CATAPRES) 0.3 MG tablet TAKE 1 TABLET BY MOUTH ONCE DAILY.  .Marland Kitchen  clopidogrel (PLAVIX) 75 MG tablet TAKE (1) TABLET BY MOUTH ONCE DAILY.  . clotrimazole-betamethasone (LOTRISONE) cream Apply 1 application topically 2 (two) times daily.  . cyclobenzaprine (FLEXERIL) 5 MG tablet Take 1 tablet (5 mg total) by mouth 3 (three) times daily as needed for muscle spasms.  . Ferrous Sulfate (IRON) 325 (65 Fe) MG TABS Take 325 mg by mouth daily.  Marland Kitchen glucose blood test strip 1 each by Other route 2 (two) times daily. Use as instructed bid E11.65 Relion Premier  . insulin glargine (LANTUS SOLOSTAR) 100 UNIT/ML Solostar Pen Inject 14 Units into the skin daily with breakfast.  . Insulin Pen Needle  (SURE COMFORT PEN NEEDLES) 32G X 4 MM MISC USING ONCE DAILY.  Marland Kitchen Lancets MISC 1 each by Does not apply route 2 (two) times daily. E11.65 Relion Premier  . lisinopril-hydrochlorothiazide (ZESTORETIC) 20-12.5 MG tablet Take 1 tablet by mouth daily.  Marland Kitchen nystatin-triamcinolone ointment (MYCOLOG) Apply 1 application topically 2 (two) times daily.  Marland Kitchen SSD 1 % cream Apply topically daily.  . traZODone (DESYREL) 50 MG tablet TAKE ONE TABLET DAILY AT BEDTIME.  . [DISCONTINUED] amLODipine (NORVASC) 10 MG tablet Take 1 tablet (10 mg total) by mouth daily.  . [DISCONTINUED] cloNIDine (CATAPRES) 0.3 MG tablet Take 0.3 mg by mouth at bedtime.  . [DISCONTINUED] insulin glargine (LANTUS SOLOSTAR) 100 UNIT/ML Solostar Pen Inject 10 Units into the skin daily with breakfast.  . [DISCONTINUED] lisinopril-hydrochlorothiazide (ZESTORETIC) 20-12.5 MG tablet Take 1 tablet by mouth daily.  . [DISCONTINUED] lisinopril-hydrochlorothiazide (ZESTORETIC) 20-12.5 MG tablet TAKE ONE TABLET BY MOUTH ONCE DAILY.  . [DISCONTINUED] traZODone (DESYREL) 50 MG tablet Take 50 mg by mouth at bedtime.   No facility-administered encounter medications on file as of 08/23/2020.   ALLERGIES: No Known Allergies VACCINATION STATUS: Immunization History  Administered Date(s) Administered  . Moderna SARS-COVID-2 Vaccination 01/04/2020, 02/06/2020    Diabetes He presents for his follow-up diabetic visit. He has type 2 diabetes mellitus. Onset time: He was diagnosed at approximate age of 35 years. His disease course has been worsening. There are no hypoglycemic associated symptoms. Pertinent negatives for hypoglycemia include no confusion, headaches, pallor or seizures. Pertinent negatives for diabetes include no chest pain, no fatigue, no polydipsia, no polyphagia, no polyuria and no weakness. There are no hypoglycemic complications. Symptoms are worsening (He recently lost control of his glycemia due to exposure to heavy dose steroids.). Risk  factors for coronary artery disease include diabetes mellitus, dyslipidemia, hypertension, male sex, sedentary lifestyle and tobacco exposure. He is compliant with treatment most of the time. His weight is stable. He is following a generally unhealthy diet. When asked about meal planning, he reported none. He has not had a previous visit with a dietitian. His home blood glucose trend is increasing steadily. His breakfast blood glucose range is generally 110-130 mg/dl. His bedtime blood glucose range is generally 140-180 mg/dl. His overall blood glucose range is 140-180 mg/dl. An ACE inhibitor/angiotensin II receptor blocker is being taken. Eye exam is current.  Hypertension This is a chronic problem. The current episode started more than 1 year ago. The problem has been gradually improving since onset. The problem is uncontrolled. Pertinent negatives include no chest pain, headaches, neck pain, palpitations or shortness of breath. Risk factors for coronary artery disease include smoking/tobacco exposure, sedentary lifestyle, dyslipidemia, diabetes mellitus and family history. Past treatments include ACE inhibitors, calcium channel blockers and central alpha agonists. The current treatment provides no improvement. Compliance problems include psychosocial issues.   Hyperlipidemia This  is a chronic problem. The current episode started more than 1 year ago. Exacerbating diseases include diabetes. Pertinent negatives include no chest pain, myalgias or shortness of breath. Current antihyperlipidemic treatment includes statins. Risk factors for coronary artery disease include dyslipidemia, diabetes mellitus, male sex, hypertension and a sedentary lifestyle.     Review of Systems  Constitutional: Negative for fatigue and unexpected weight change.  HENT: Negative for dental problem, mouth sores and trouble swallowing.   Eyes: Negative for visual disturbance.  Respiratory: Negative for cough, choking, chest  tightness, shortness of breath and wheezing.   Cardiovascular: Negative for chest pain, palpitations and leg swelling.  Gastrointestinal: Negative for abdominal distention, abdominal pain, constipation, diarrhea, nausea and vomiting.  Endocrine: Negative for polydipsia, polyphagia and polyuria.  Genitourinary: Negative for dysuria, flank pain, hematuria and urgency.  Musculoskeletal: Negative for back pain, gait problem, myalgias and neck pain.  Skin: Negative for pallor, rash and wound.  Neurological: Negative for seizures, syncope, weakness, numbness and headaches.  Psychiatric/Behavioral: Negative.  Negative for confusion and dysphoric mood.    Objective:    BP (!) 161/77   Pulse 90   Ht 5\' 7"  (1.702 m)   Wt 137 lb 8 oz (62.4 kg)   BMI 21.54 kg/m   Wt Readings from Last 3 Encounters:  08/23/20 137 lb 8 oz (62.4 kg)  08/14/20 136 lb (61.7 kg)  07/31/20 136 lb 6.4 oz (61.9 kg)     Physical Exam- Limited  Constitutional:  Body mass index is 21.54 kg/m. , not in acute distress, normal state of mind Eyes:  EOMI, no exophthalmos Neck: Supple Thyroid: No gross goiter Respiratory: Adequate breathing efforts Musculoskeletal: no gross deformities, strength intact in all four extremities, no gross restriction of joint movements Skin:  no rashes, no hyperemia Neurological: no tremor with outstretched hands,   Complete Blood Count (Most recent): Lab Results  Component Value Date   WBC 7.2 09/16/2018   HGB 12.8 (L) 09/16/2018   HCT 39.5 09/16/2018   MCV 94.5 09/16/2018   PLT 202 09/16/2018   Chemistry (most recent): Lab Results  Component Value Date   NA 133 (L) 03/28/2020   K 4.1 03/28/2020   CL 100 03/28/2020   CO2 27 03/28/2020   BUN 13 03/28/2020   CREATININE 1.34 (H) 03/28/2020   Diabetic Labs (most recent): Lab Results  Component Value Date   HGBA1C 7.5 (A) 08/23/2020   HGBA1C 6.8 (A) 04/23/2020   HGBA1C 6.2 (A) 12/20/2019   Lipid Panel     Component Value  Date/Time   CHOL 113 12/12/2019 0905   TRIG 55 12/12/2019 0905   HDL 43 12/12/2019 0905   CHOLHDL 2.6 12/12/2019 0905   LDLCALC 56 12/12/2019 0905     Assessment & Plan:   1. Type 2 diabetes mellitus with microalbuminuria , with long-term current use of insulin (HCC)  -He returns with slightly above target glycemic profile and point-of-care A1c of 7.5%, increasing from 6.8%.   He does not have any hypoglycemic episodes.  He is a chronic heavy smoker, remains at a high risk for more acute and chronic complications of diabetes which include CAD, CVA, CKD, retinopathy, and neuropathy. These are all discussed in detail with the patient.    Recent labs reviewed, stage 3 renal insufficiency. - I have re-counseled the patient on diet management  by adopting a carbohydrate restricted / protein rich  Diet.  - he acknowledges that there is a room for improvement in his food and drink  choices. - Suggestion is made for him to avoid simple carbohydrates  from his diet including Cakes, Sweet Desserts, Ice Cream, Soda (diet and regular), Sweet Tea, Candies, Chips, Cookies, Store Bought Juices, Alcohol in Excess of  1-2 drinks a day, Artificial Sweeteners,  Coffee Creamer, and "Sugar-free" Products, Lemonade. This will help patient to have more stable blood glucose profile and potentially avoid unintended weight gain.  - Patient is advised to stick to a routine mealtimes to eat 3 meals  a day and avoid unnecessary snacks ( to snack only to correct hypoglycemia).  - I have approached patient with the following individualized plan to manage diabetes and patient agrees.  - He has benefited from insulin initiation.   - Based on  his presentation with slightly above target glycemic profile, he is advised to increase his Lantus to 14 units nightly,  associated with monitoring of blood glucose twice a day-daily before breakfast and at bedtime.    - He will call clinic if he registers blood glucose readings  less than 70 or greater than 200 mg/dL.  -   He has stage 3 renal insufficiency which has improved over time, will stay off of metformin for now. If his renal insufficiency continues, he will need consultation with nephrology.    - Patient is not a candidate for  incretin therapy, nor SGLT2 inhibitors. - Patient specific target  for A1c; LDL, HDL, Triglycerides, were discussed in detail.  2) BP/HTN: His blood pressure is not controlled.      He has 3 medications including losartan, clonidine, and amlodipine.  He continues to smoke heavily.  He did not take his blood pressure medications this morning.  He has responded to the addition of clonidine.  The patient was counseled on the dangers of tobacco use, and was advised to quit.  Reviewed strategies to maximize success, including removing cigarettes and smoking materials from environment.   3) Lipids/HPL: Recent lipid panel showed LDL of 45.  He is not on statins at the moment.   4)  Weight/Diet: His BMI is 21.  He is not a candidate for weight loss.  He has maintained his gain of 20 pounds of weight.   - He would benefit from addition of more complex carbohydrates, information provided. CDE consult is requested.  5. Vitamin D deficiency  - He he is on ongoing supplement with vitamin D3 5000 units daily.    6) Chronic Care/Health Maintenance:  -Patient is on ACEI/ARB and Statin medications and encouraged to continue to follow up with Ophthalmology, Podiatrist at least yearly or according to recommendations, and advised to  Quit  smoking. I have recommended yearly flu vaccine and pneumonia vaccination at least every 5 years; moderate intensity exercise for up to 150 minutes weekly; and  sleep for at least 7 hours a day. -He is counseled extensively for smoking cessation.  - I advised patient to maintain close follow up with [name], MD for primary care needs. - I have advised him to avoid over-the-counter pain medications to  avoid additional risk for renal insufficiency.  - Time spent on this patient care encounter:  35 min, of which > 50% was spent in  counseling and the rest reviewing his blood glucose logs , discussing his hypoglycemia and hyperglycemia episodes, reviewing his current and  previous labs / studies  ( including abstraction from other facilities) and medications  doses and developing a  long term treatment plan and documenting his care.   Please  refer to Patient Instructions for Blood Glucose Monitoring and Insulin/Medications Dosing Guide"  in media tab for additional information. Please  also refer to " Patient Self Inventory" in the Media  tab for reviewed elements of pertinent patient history.  Roseanna Rainbow Schriner participated in the discussions, expressed understanding, and voiced agreement with the above plans.  All questions were answered to his satisfaction. he is encouraged to contact clinic should he have any questions or concerns prior to his return visit.   Follow up plan: -Return in about 6 months (around 02/21/2021) for F/U with Pre-visit Labs, Meter, Logs, A1c here.Marquis Lunch, MD Phone: [phone]  Fax: [phone]  This note was partially dictated with voice recognition software. Similar sounding words can be transcribed inadequately or may not  be corrected upon review.  08/23/2020, 8:57 AM

## 2020-09-06 ENCOUNTER — Other Ambulatory Visit (INDEPENDENT_AMBULATORY_CARE_PROVIDER_SITE_OTHER): Payer: Self-pay | Admitting: Internal Medicine

## 2020-09-06 DIAGNOSIS — I739 Peripheral vascular disease, unspecified: Secondary | ICD-10-CM

## 2020-09-09 ENCOUNTER — Other Ambulatory Visit (INDEPENDENT_AMBULATORY_CARE_PROVIDER_SITE_OTHER): Payer: Self-pay | Admitting: Internal Medicine

## 2020-09-09 DIAGNOSIS — G4700 Insomnia, unspecified: Secondary | ICD-10-CM

## 2020-09-16 ENCOUNTER — Ambulatory Visit: Payer: Medicare Other | Admitting: Orthopedic Surgery

## 2020-09-17 ENCOUNTER — Ambulatory Visit: Payer: Medicare Other | Admitting: Orthopedic Surgery

## 2020-09-20 DIAGNOSIS — E119 Type 2 diabetes mellitus without complications: Secondary | ICD-10-CM | POA: Diagnosis not present

## 2020-09-20 LAB — HM DIABETES EYE EXAM

## 2020-09-23 ENCOUNTER — Ambulatory Visit (INDEPENDENT_AMBULATORY_CARE_PROVIDER_SITE_OTHER): Payer: Medicare Other | Admitting: Urology

## 2020-09-23 ENCOUNTER — Encounter: Payer: Self-pay | Admitting: Urology

## 2020-09-23 ENCOUNTER — Other Ambulatory Visit: Payer: Self-pay

## 2020-09-23 VITALS — BP 160/77 | HR 106 | Temp 98.0°F | Ht 67.0 in | Wt 135.0 lb

## 2020-09-23 DIAGNOSIS — N489 Disorder of penis, unspecified: Secondary | ICD-10-CM

## 2020-09-23 LAB — MICROSCOPIC EXAMINATION
Bacteria, UA: NONE SEEN
Epithelial Cells (non renal): NONE SEEN /hpf (ref 0–10)
RBC, Urine: NONE SEEN /hpf (ref 0–2)
Renal Epithel, UA: NONE SEEN /hpf
WBC, UA: NONE SEEN /hpf (ref 0–5)

## 2020-09-23 LAB — URINALYSIS, ROUTINE W REFLEX MICROSCOPIC
Bilirubin, UA: NEGATIVE
Glucose, UA: NEGATIVE
Ketones, UA: NEGATIVE
Leukocytes,UA: NEGATIVE
Nitrite, UA: NEGATIVE
RBC, UA: NEGATIVE
Specific Gravity, UA: 1.02 (ref 1.005–1.030)
Urobilinogen, Ur: 2 mg/dL — ABNORMAL HIGH (ref 0.2–1.0)
pH, UA: 8.5 — ABNORMAL HIGH (ref 5.0–7.5)

## 2020-09-23 MED ORDER — NYSTATIN-TRIAMCINOLONE 100000-0.1 UNIT/GM-% EX OINT: 1.0000 "application " | TOPICAL_OINTMENT | Freq: Two times a day (BID) | CUTANEOUS | 1 refills | Status: DC

## 2020-09-23 NOTE — Progress Notes (Signed)
09/23/2020 9:08 AM   Chase Briggs 11-06-1950 [id number]  Referring provider: [name], MD [address] [place],  [place] [address]  Penile irritation  HPI: Chase Briggs is a 70yo here for for followup for balanitis and penile irritation. He is using the nystatin ointment which has improved his balanitis and penile irriation. No LUTS. No swelling or edema   PMH: Past Medical History:  Diagnosis Date  . Diabetes mellitus   . Fatty liver   . GERD (gastroesophageal reflux disease)   . HTN (hypertension)   . Hyperlipidemia   . Insomnia     Surgical History: Past Surgical History:  Procedure Laterality Date  . ABDOMINAL AORTOGRAM W/LOWER EXTREMITY N/A 08/22/2018   Procedure: ABDOMINAL AORTOGRAM W/LOWER EXTREMITY;  Surgeon: [name], MD;  Location: [facility] INVASIVE CV LAB;  Service: Cardiovascular;  Laterality: N/A;  . BACK SURGERY    . CARPAL TUNNEL WITH CUBITAL TUNNEL Right 03/21/2019   Procedure: RIGHT CARPAL TUNNEL AND CUBITAL  TUNNEL RELEASES;  Surgeon: [name], MD;  Location: [facility];  Service: Orthopedics;  Laterality: Right;  AXILLARY BLOCK  . COLONOSCOPY  01/2007   Dr. [name], reviewed report, mild diverticulosis. Prep adequate. Next TCS due 01/2017  . ESOPHAGOGASTRODUODENOSCOPY N/A 10/21/2012   [id number] GASTRITIS  . MASS EXCISION Right 10/26/2012   Procedure: EXCISION MASS;  Surgeon: [name], MD;  Location: [facility];  Service: General;  Laterality: Right;  . PERIPHERAL VASCULAR INTERVENTION  08/22/2018   Procedure: PERIPHERAL VASCULAR INTERVENTION;  Surgeon: [name], MD;  Location: [facility] INVASIVE CV LAB;  Service: Cardiovascular;;  bilateral external iliac stents  . scalp mass  4-5 yrs ago   Dr [name]  . ULNAR NERVE TRANSPOSITION Right 03/21/2019   Procedure: DECOMPRESSION RIGHT ULNAR NERVE;  Surgeon: [name], MD;  Location: [facility];  Service: Orthopedics;  Laterality: Right;     Home Medications:  Allergies as of 09/23/2020   No Known Allergies     Medication List       Accurate as of September 23, 2020  9:08 AM. If you have any questions, ask your nurse or doctor.        amLODipine 10 MG tablet Commonly known as: NORVASC TAKE (1) TABLET BY MOUTH ONCE DAILY.   aspirin EC 81 MG tablet Take 81 mg by mouth daily.   atorvastatin 10 MG tablet Commonly known as: LIPITOR TAKE ONE TABLET BY MOUTH ONCE DAILY.   cloNIDine 0.3 MG tablet Commonly known as: CATAPRES TAKE 1 TABLET BY MOUTH ONCE DAILY.   clopidogrel 75 MG tablet Commonly known as: PLAVIX TAKE (1) TABLET BY MOUTH ONCE DAILY.   clotrimazole-betamethasone cream Commonly known as: Lotrisone Apply 1 application topically 2 (two) times daily.   cyclobenzaprine 5 MG tablet Commonly known as: FLEXERIL Take 1 tablet (5 mg total) by mouth 3 (three) times daily as needed for muscle spasms.   glucose blood test strip 1 each by Other route 2 (two) times daily. Use as instructed bid E11.65 Relion Premier   Iron 325 (65 Fe) MG Tabs Take 325 mg by mouth daily.   Lancets Misc 1 each by Does not apply route 2 (two) times daily. E11.65 Relion Premier   Lantus SoloStar 100 UNIT/ML Solostar Pen Generic drug: insulin glargine Inject 14 Units into the skin daily with breakfast.   lisinopril-hydrochlorothiazide 20-12.5 MG tablet Commonly known as: ZESTORETIC Take 1 tablet by mouth daily.   nystatin-triamcinolone ointment Commonly known as:  MYCOLOG Apply 1 application topically 2 (two) times daily.   SSD 1 % cream Generic drug: silver sulfADIAZINE Apply topically daily.   Sure Comfort Pen Needles 32G X 4 MM Misc Generic drug: Insulin Pen Needle USING ONCE DAILY.   traZODone 50 MG tablet Commonly known as: DESYREL TAKE ONE TABLET DAILY AT BEDTIME.   Vitamin D 125 MCG (5000 UT) Caps Take 5,000 Units by mouth daily.       Allergies: No Known Allergies  Family History: Family History   Problem Relation Age of Onset  . Diabetes Mother   . Diabetes Father   . Diabetes Brother   . Colon cancer Neg Hx   . Liver disease Neg Hx     Social History:  reports that he has been smoking cigarettes. He has a 35.00 pack-year smoking history. He has never used smokeless tobacco. He reports that he does not drink alcohol and does not use drugs.  ROS: All other review of systems were reviewed and are negative except what is noted above in HPI  Physical Exam: BP (!) 160/77   Pulse (!) 106   Temp 98 F (36.7 C)   Ht 5\' 7"  (1.702 m)   Wt 135 lb (61.2 kg)   BMI 21.14 kg/m   Constitutional:  Alert and oriented, No acute distress. HEENT: [place] AT, moist mucus membranes.  Trachea midline, no masses. Cardiovascular: No clubbing, cyanosis, or edema. Respiratory: Normal respiratory effort, no increased work of breathing. GI: Abdomen is soft, nontender, nondistended, no abdominal masses GU: No CVA tenderness. Uncircumcised phallus. No masses/lesions on penis, testis, scrotum. balanitis improving. Lymph: No cervical or inguinal lymphadenopathy. Skin: No rashes, bruises or suspicious lesions. Neurologic: Grossly intact, no focal deficits, moving all 4 extremities. Psychiatric: Normal mood and affect.  Laboratory Data: Lab Results  Component Value Date   WBC 7.2 09/16/2018   HGB 12.8 (L) 09/16/2018   HCT 39.5 09/16/2018   MCV 94.5 09/16/2018   PLT 202 09/16/2018    Lab Results  Component Value Date   CREATININE 1.34 (H) 03/28/2020    No results found for: PSA  No results found for: TESTOSTERONE  Lab Results  Component Value Date   HGBA1C 7.5 (A) 08/23/2020    Urinalysis    Component Value Date/Time   COLORURINE STRAW (A) 09/16/2018 0851   APPEARANCEUR Clear 09/23/2020 0855   LABSPEC 1.005 09/16/2018 0851   PHURINE 6.0 09/16/2018 0851   GLUCOSEU Negative 09/23/2020 0855   HGBUR SMALL (A) 09/16/2018 0851   BILIRUBINUR Negative 09/23/2020 0855   KETONESUR NEGATIVE  09/16/2018 0851   PROTEINUR 2+ (A) 09/23/2020 0855   PROTEINUR NEGATIVE 09/16/2018 0851   UROBILINOGEN 0.2 02/19/2014 1340   NITRITE Negative 09/23/2020 0855   NITRITE NEGATIVE 09/16/2018 0851   LEUKOCYTESUR Negative 09/23/2020 0855    Lab Results  Component Value Date   LABMICR See below: 09/23/2020   WBCUA None seen 09/23/2020   LABEPIT None seen 09/23/2020   MUCUS Present 09/23/2020   BACTERIA None seen 09/23/2020    Pertinent Imaging:  No results found for this or any previous visit.  No results found for this or any previous visit.  No results found for this or any previous visit.  No results found for this or any previous visit.  No results found for this or any previous visit.  No results found for this or any previous visit.  No results found for this or any previous visit.  No results found for this or  any previous visit.   Assessment & Plan:    1. Penile lesion -Continue nystatin cream BID. RTC 3 months - Urinalysis, Routine w reflex microscopic   No follow-ups on file.  Wilkie Aye, MD  [facility] [place]

## 2020-09-23 NOTE — Progress Notes (Signed)
Urological Symptom Review  Patient is experiencing the following symptoms: Hard to postpone urination Erection problems (male only)   Review of Systems  Gastrointestinal (upper)  : Negative for upper GI symptoms  Gastrointestinal (lower) : Negative for lower GI symptoms  Constitutional : Weight loss  Skin: Negative for skin symptoms  Eyes: Negative for eye symptoms  Ear/Nose/Throat : Negative for Ear/Nose/Throat symptoms  Hematologic/Lymphatic: Easy bruising  Cardiovascular : Negative for cardiovascular symptoms  Respiratory : Negative for respiratory symptoms  Endocrine: Negative for endocrine symptoms  Musculoskeletal: Back pain  Neurological: Negative for neurological symptoms  Psychologic: Negative for psychiatric symptoms

## 2020-09-23 NOTE — Patient Instructions (Signed)
Pfenniger and Fowler's Procedures for Primary Care (3rd ed., pp. [phone]). [place], [place]: [employer]."> Hinman's Atlas of Urological Surgery (4th ed., pp. [phone]). [place], [place]: Elsevier.">  Circumcision, Adult  Circumcision is a surgery to remove the foreskin of the penis or to cut the foreskin so the space between the skin and tip of the penis is larger. When the foreskin is cut but not removed, the procedure is called a dorsal incision or dorsal circumcision. A dorsal circumcision leaves the entire foreskin but makes the end of the foreskin looser so it can be pulled back over the head of the penis. Tell a health care provider about:  Any allergies you have.  All medicines you are taking, including vitamins, herbs, eye drops, creams, and over-the-counter medicines.  Any problems you or family members have had with anesthetic medicines.  Any blood disorders you have.  Any surgeries you have had.  Any medical conditions you have, including the common cold or another infection. What are the risks? Generally, this is a safe procedure. However, problems may occur, including:  Bleeding.  Infection.  Pain.  Allergic reactions to medicines.  Opening of the surgical wound. This can occur from an unwanted erection after surgery. What happens before the procedure? Staying hydrated Follow instructions from your health care provider about hydration, which may include:  Up to 2 hours before the procedure - you may continue to drink clear liquids, such as water, clear fruit juice, black coffee, and plain tea.   Eating and drinking restrictions Follow instructions from your health care provider about eating and drinking, which may include:  8 hours before the procedure - stop eating heavy meals or foods, such as meat, fried foods, or fatty foods.  6 hours before the procedure - stop eating light meals or foods, such as toast or cereal.  6 hours before the procedure - stop drinking milk  or drinks that contain milk.  2 hours before the procedure - stop drinking clear liquids. Medicines  Ask your health care provider about: ? Changing or stopping your regular medicines. This is especially important if you take diabetes medicines or blood thinners. ? Taking medicines such as aspirin and ibuprofen. These medicines can thin your blood. Do not take these medicines unless your health care provider tells you to take them. ? Taking over-the-counter medicines, vitamins, herbs, and supplements. General instructions  Do not use any products that contain nicotine or tobacco for at least 4 weeks before the procedure. These products include cigarettes, e-cigarettes, and chewing tobacco. If you need help quitting, ask your health care provider.  Plan to have someone take you home from the hospital or clinic.  If you will be going home right after the procedure, plan to have someone with you for 24 hours.  Ask your health care provider: ? How your surgery site will be marked. ? What steps will be taken to help prevent infection. These may include:  Washing skin with a germ-killing soap.  Taking antibiotic medicine.   What happens during the procedure?  An IV may be inserted into one of your veins.  You will be given one or more of the following: ? A medicine to help you relax (sedative). ? A medicine to numb the area (local anesthetic). This will be injected with a needle into the skin of your penis.  An incision will be made to remove or cut the foreskin.  Absorbable stitches (sutures) may be used to close the incision.  A bandage (dressing)   will be applied to the incision site.  The IV will be removed. The procedure may vary among health care providers and hospitals. What happens after the procedure?  Your blood pressure, heart rate, breathing rate, and blood oxygen level will be monitored until you leave the hospital or clinic.  Do not get out of bed until your health  care provider approves.  If you were given a sedative during the procedure, it can affect you for several hours. Do not drive or operate machinery until your health care provider says that it is safe. Summary  Circumcision is a surgery to remove the foreskin of the penis or to cut the foreskin so the space between the skin and tip of the penis is larger.  If you will be going home right after the procedure, plan to have someone with you for 24 hours.  Absorbable sutures may be used to close the incision after the foreskin has been removed or cut. This information is not intended to replace advice given to you by your health care provider. Make sure you discuss any questions you have with your health care provider. Document Revised: 07/05/2019 Document Reviewed: 07/05/2019 Elsevier Patient Education  2021 Elsevier Inc.  

## 2020-09-25 ENCOUNTER — Other Ambulatory Visit (INDEPENDENT_AMBULATORY_CARE_PROVIDER_SITE_OTHER): Payer: Self-pay | Admitting: Internal Medicine

## 2020-09-25 ENCOUNTER — Telehealth (INDEPENDENT_AMBULATORY_CARE_PROVIDER_SITE_OTHER): Payer: Self-pay | Admitting: Internal Medicine

## 2020-09-25 NOTE — Telephone Encounter (Signed)
I did that today already.  Thanks.

## 2020-10-10 ENCOUNTER — Ambulatory Visit (INDEPENDENT_AMBULATORY_CARE_PROVIDER_SITE_OTHER): Payer: Medicare Other | Admitting: Physician Assistant

## 2020-10-10 ENCOUNTER — Ambulatory Visit (HOSPITAL_COMMUNITY)
Admission: RE | Admit: 2020-10-10 | Discharge: 2020-10-10 | Disposition: A | Discharge status: Home / Self Care | Payer: Medicare Other | Source: Ambulatory Visit | Attending: Physician Assistant | Admitting: Physician Assistant

## 2020-10-10 ENCOUNTER — Ambulatory Visit (INDEPENDENT_AMBULATORY_CARE_PROVIDER_SITE_OTHER)
Admission: RE | Admit: 2020-10-10 | Discharge: 2020-10-10 | Disposition: A | Discharge status: Home / Self Care | Payer: Medicare Other | Source: Ambulatory Visit | Attending: Physician Assistant | Admitting: Physician Assistant

## 2020-10-10 ENCOUNTER — Other Ambulatory Visit: Payer: Self-pay

## 2020-10-10 VITALS — BP 163/83 | HR 79 | Temp 98.6°F | Resp 20 | Ht 67.0 in | Wt 134.0 lb

## 2020-10-10 DIAGNOSIS — I739 Peripheral vascular disease, unspecified: Secondary | ICD-10-CM

## 2020-10-10 DIAGNOSIS — I771 Stricture of artery: Secondary | ICD-10-CM

## 2020-10-10 DIAGNOSIS — Z95828 Presence of other vascular implants and grafts: Secondary | ICD-10-CM

## 2020-10-10 NOTE — Progress Notes (Signed)
Office Note     CC:  follow up Requesting Provider:  [name], MD  HPI: Chase Briggs is a 70 y.o. (Dec 28, 1950) male who presents for follow up of peripheral artery disease. He has history of bilateral iliac stents by Dr. [name] in December of 2019. He was last evaluated in January of 2021 at which time he was doing well without any lower extremity symptoms and his non invasive studies showed patient iliac stents and only minimal change in RLE ABI. He was initiated on Lipitor at that time as he had not been taking it.  He presents today for follow up and to review his non invasive studies. He explains that his right calf gets tight/ uncomfortable when he is walking up inclines otherwise does not bother him much going down hill or on level ground. He says this resolves with rest. This does not occur in the left leg. He denies any pain at rest. He does not have any tissue loss or non healing wounds. He has been compliant with his Aspirin, statin and Plavix since his last visit. He does still currently smoke 1 ppd.   The pt is on a statin for cholesterol management.  The pt is on a daily aspirin.   Other AC: Plavix The pt is on CCB, Zestoretic for hypertension.   The pt is diabetic. A1c 08/23/20 was 6.8 Tobacco hx: Current every day smoker  Past Medical History:  Diagnosis Date  . Diabetes mellitus   . Fatty liver   . GERD (gastroesophageal reflux disease)   . HTN (hypertension)   . Hyperlipidemia   . Insomnia     Past Surgical History:  Procedure Laterality Date  . ABDOMINAL AORTOGRAM W/LOWER EXTREMITY N/A 08/22/2018   Procedure: ABDOMINAL AORTOGRAM W/LOWER EXTREMITY;  Surgeon: [name], MD;  Location: [facility] INVASIVE CV LAB;  Service: Cardiovascular;  Laterality: N/A;  . BACK SURGERY    . CARPAL TUNNEL WITH CUBITAL TUNNEL Right 03/21/2019   Procedure: RIGHT CARPAL TUNNEL AND CUBITAL  TUNNEL RELEASES;  Surgeon: [name], MD;  Location: [facility];  Service: Orthopedics;  Laterality: Right;  AXILLARY BLOCK  . COLONOSCOPY  01/2007   Dr. [name], reviewed report, mild diverticulosis. Prep adequate. Next TCS due 01/2017  . ESOPHAGOGASTRODUODENOSCOPY N/A 10/21/2012   [id number] GASTRITIS  . MASS EXCISION Right 10/26/2012   Procedure: EXCISION MASS;  Surgeon: [name], MD;  Location: [facility];  Service: General;  Laterality: Right;  . PERIPHERAL VASCULAR INTERVENTION  08/22/2018   Procedure: PERIPHERAL VASCULAR INTERVENTION;  Surgeon: [name], MD;  Location: [facility] INVASIVE CV LAB;  Service: Cardiovascular;;  bilateral external iliac stents  . scalp mass  4-5 yrs ago   Dr [name]  . ULNAR NERVE TRANSPOSITION Right 03/21/2019   Procedure: DECOMPRESSION RIGHT ULNAR NERVE;  Surgeon: [name], MD;  Location: [facility];  Service: Orthopedics;  Laterality: Right;    Social History   Socioeconomic History  . Marital status: Married    Spouse name: Not on file  . Number of children: 2  . Years of education: Not on file  . Highest education level: Not on file  Occupational History  . Occupation: reitred    Comment: [employer], heavy lifting  Tobacco Use  . Smoking status: Current Every Day Smoker    Packs/day: 1.00    Years: 35.00    Pack years: 35.00    Types: Cigarettes  . Smokeless tobacco: Never Used  Vaping  Use  . Vaping Use: Never used  Substance and Sexual Activity  . Alcohol use: No    Comment: hx heavy etoh x 3 yrs, quit 30+ yrs ago  . Drug use: No  . Sexual activity: Yes    Birth control/protection: None  Other Topics Concern  . 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 Highest level of education: 12th grade. Married for 12 years. Has 2 sons (33 and 21). Lives with wife.   Social Determinants of Health   Financial Resource Strain: Not on file  Food Insecurity: Not on file  Transportation Needs: Not on file  Physical Activity: Not on file   Stress: Not on file  Social Connections: Not on file  Intimate Partner Violence: Not on file    Family History  Problem Relation Age of Onset  . Diabetes Mother   . Diabetes Father   . Diabetes Brother   . Colon cancer Neg Hx   . Liver disease Neg Hx     Current Outpatient Medications  Medication Sig Dispense Refill  . amLODipine (NORVASC) 10 MG tablet TAKE (1) TABLET BY MOUTH ONCE DAILY. 90 tablet 0  . aspirin EC 81 MG tablet Take 81 mg by mouth daily.    Marland Kitchen atorvastatin (LIPITOR) 10 MG tablet TAKE ONE TABLET BY MOUTH ONCE DAILY. 90 tablet 0  . Cholecalciferol (VITAMIN D) 125 MCG (5000 UT) CAPS Take 5,000 Units by mouth daily.     . cloNIDine (CATAPRES) 0.3 MG tablet TAKE 1 TABLET BY MOUTH ONCE DAILY. 90 tablet 0  . clopidogrel (PLAVIX) 75 MG tablet TAKE (1) TABLET BY MOUTH ONCE DAILY. 90 tablet 0  . Ferrous Sulfate (IRON) 325 (65 Fe) MG TABS Take 325 mg by mouth daily.    Marland Kitchen glucose blood test strip 1 each by Other route 2 (two) times daily. Use as instructed bid E11.65 Relion Premier 100 each 5  . insulin glargine (LANTUS SOLOSTAR) 100 UNIT/ML Solostar Pen Inject 14 Units into the skin daily with breakfast. 10 mL 1  . Insulin Pen Needle (SURE COMFORT PEN NEEDLES) 32G X 4 MM MISC USING ONCE DAILY. 100 each 3  . Lancets MISC 1 each by Does not apply route 2 (two) times daily. E11.65 Relion Premier 100 each 5  . lisinopril-hydrochlorothiazide (ZESTORETIC) 20-12.5 MG tablet Take 1 tablet by mouth daily. 90 tablet 1  . nystatin-triamcinolone ointment (MYCOLOG) Apply 1 application topically 2 (two) times daily. 30 g 1  . SSD 1 % cream Apply topically daily.    . traZODone (DESYREL) 50 MG tablet TAKE ONE TABLET DAILY AT BEDTIME. 90 tablet 0   No current facility-administered medications for this visit.    No Known Allergies   REVIEW OF SYSTEMS:  [X]  denotes positive finding, [ ]  denotes negative finding Cardiac  Comments:  Chest pain or chest pressure:    Shortness of breath  upon exertion:    Short of breath when lying flat:    Irregular heart rhythm:        Vascular    Pain in calf, thigh, or hip brought on by ambulation:    Pain in feet at night that wakes you up from your sleep:     Blood clot in your veins:    Leg swelling:         Pulmonary    Oxygen at home:    Productive cough:     Wheezing:  Neurologic    Sudden weakness in arms or legs:     Sudden numbness in arms or legs:     Sudden onset of difficulty speaking or slurred speech:    Temporary loss of vision in one eye:     Problems with dizziness:         Gastrointestinal    Blood in stool:     Vomited blood:         Genitourinary    Burning when urinating:     Blood in urine:        Psychiatric    Major depression:         Hematologic    Bleeding problems:    Problems with blood clotting too easily:        Skin    Rashes or ulcers:        Constitutional    Fever or chills:      PHYSICAL EXAMINATION:  Vitals:   10/10/20 0954  BP: (!) 163/83  Pulse: 79  Resp: 20  Temp: 98.6 F (37 C)  TempSrc: Temporal  SpO2: 99%  Weight: 134 lb (60.8 kg)  Height: 5\' 7"  (1.702 m)    General:  WDWN in NAD; vital signs documented above Gait: Normal  HENT: WNL, normocephalic Pulmonary: normal non-labored breathing , without wheezing Cardiac: regular HR, without  Murmurs without carotid bruit Abdomen: soft, NT, no masses Vascular Exam/Pulses:  Right Left  Radial 2+ (normal) 2+ (normal)  Femoral 2+ (normal) 2+ (normal)  Popliteal 2+ (normal) 2+ (normal)  DP 1+ (weak) 2+ (normal)  PT Not palpable Not palpable   Extremities: without ischemic changes, without Gangrene , without cellulitis; without open wounds;  Musculoskeletal: no muscle wasting or atrophy  Neurologic: A&O X 3;  No focal weakness or paresthesias are detected Psychiatric:  The pt has Normal affect.   Non-Invasive Vascular Imaging:    +-------+-----------+-----------+------------+------------+   ABI/TBIToday's ABIToday's TBIPrevious ABIPrevious TBI  +-------+-----------+-----------+------------+------------+  Right 0.83    0.71    0.79    0.77      +-------+-----------+-----------+------------+------------+  Left  1.08    0.75    1.05    1.03      +-------+-----------+-----------+------------+------------+  The right toe pressure is 116 mmHg, the left toe pressure is 122 mmHg  Independently reviewed today's arterial duplex of bilateral iliac artery stents which are both patent throughout. The right EIA stent is triphasic throughout, the left EIA stent is biphasic throughout.    ASSESSMENT/PLAN:: 70 y.o. male here for follow up for peripheral artery disease. He has history of bilateral external iliac artery stenting by Dr. 78 in December of 2019.  The stents appear patent today on arterial duplex. Bilateral ABI stable from prior examination. Very slight decrease in the left TBI. He is having some mild claudication like symptoms on the right leg. We will continue to follow this. I have encouraged him to continue his walking regimen. He is not having any rest pain or tissue loss.  If his symptoms progress I have advised him to follow up sooner - Re- discussed smoking cessation - He will continue Aspirin, Statin and plavix - Follow up in 1 year with ABI and Aorto/iliac duplex  04-24-1982, PA-C [facility] [phone]  Clinic MD:  Dr. [phone]

## 2020-11-18 ENCOUNTER — Ambulatory Visit (INDEPENDENT_AMBULATORY_CARE_PROVIDER_SITE_OTHER): Payer: Medicare Other | Admitting: Nurse Practitioner

## 2020-11-25 ENCOUNTER — Encounter (INDEPENDENT_AMBULATORY_CARE_PROVIDER_SITE_OTHER): Payer: Medicare Other | Admitting: Nurse Practitioner

## 2020-11-25 DIAGNOSIS — L84 Corns and callosities: Secondary | ICD-10-CM | POA: Diagnosis not present

## 2020-11-25 DIAGNOSIS — M79674 Pain in right toe(s): Secondary | ICD-10-CM | POA: Diagnosis not present

## 2020-11-25 DIAGNOSIS — L600 Ingrowing nail: Secondary | ICD-10-CM | POA: Diagnosis not present

## 2020-11-28 ENCOUNTER — Encounter (INDEPENDENT_AMBULATORY_CARE_PROVIDER_SITE_OTHER): Payer: Medicare Other | Admitting: Nurse Practitioner

## 2020-12-05 ENCOUNTER — Other Ambulatory Visit (INDEPENDENT_AMBULATORY_CARE_PROVIDER_SITE_OTHER): Payer: Self-pay | Admitting: Internal Medicine

## 2020-12-05 DIAGNOSIS — G4700 Insomnia, unspecified: Secondary | ICD-10-CM

## 2020-12-05 DIAGNOSIS — I739 Peripheral vascular disease, unspecified: Secondary | ICD-10-CM

## 2020-12-12 ENCOUNTER — Other Ambulatory Visit: Payer: Self-pay

## 2020-12-12 ENCOUNTER — Encounter (INDEPENDENT_AMBULATORY_CARE_PROVIDER_SITE_OTHER): Payer: Self-pay | Admitting: Nurse Practitioner

## 2020-12-12 ENCOUNTER — Ambulatory Visit (INDEPENDENT_AMBULATORY_CARE_PROVIDER_SITE_OTHER): Payer: Medicare Other | Admitting: Nurse Practitioner

## 2020-12-12 VITALS — BP 130/78 | HR 66 | Temp 97.7°F | Ht 65.75 in | Wt 135.6 lb

## 2020-12-12 DIAGNOSIS — Z0000 Encounter for general adult medical examination without abnormal findings: Secondary | ICD-10-CM

## 2020-12-12 NOTE — Progress Notes (Signed)
Subjective:   Chase Briggs is a 70 y.o. male who presents for Medicare Annual/Subsequent preventive examination.  Review of Systems     Cardiac Risk Factors include: advanced age (>64men, >57 women);male gender;hypertension;dyslipidemia;diabetes mellitus     Objective:    Today's Vitals   12/12/20 1027  BP: 130/78  Pulse: 66  Temp: 97.7 F (36.5 C)  TempSrc: Temporal  SpO2: 97%  Weight: 135 lb 9.6 oz (61.5 kg)  Height: 5' 5.75" (1.67 m)   Body mass index is 22.05 kg/m.  Advanced Directives 12/12/2020 03/21/2019 09/16/2018 09/11/2018 08/22/2018 10/30/2017 10/27/2017  Does Patient Have a Medical Advance Directive? No No No No No No No  Would patient like information on creating a medical advance directive? No - Patient declined No - Patient declined - No - Patient declined No - Patient declined No - Patient declined -  Pre-existing out of facility DNR order (yellow form or pink MOST form) - - - - - - -    Current Medications (verified) Outpatient Encounter Medications as of 12/12/2020  Medication Sig  . amLODipine (NORVASC) 10 MG tablet TAKE (1) TABLET BY MOUTH ONCE DAILY.  Marland Kitchen aspirin EC 81 MG tablet Take 81 mg by mouth daily.  Marland Kitchen atorvastatin (LIPITOR) 10 MG tablet TAKE ONE TABLET BY MOUTH ONCE DAILY.  Marland Kitchen Cholecalciferol (VITAMIN D) 125 MCG (5000 UT) CAPS Take 5,000 Units by mouth daily.   . cloNIDine (CATAPRES) 0.3 MG tablet TAKE 1 TABLET BY MOUTH ONCE DAILY.  Marland Kitchen clopidogrel (PLAVIX) 75 MG tablet TAKE (1) TABLET BY MOUTH ONCE DAILY.  . clotrimazole-betamethasone (LOTRISONE) cream   . Ferrous Sulfate (IRON) 325 (65 Fe) MG TABS Take 325 mg by mouth daily.  Marland Kitchen glucose blood test strip 1 each by Other route 2 (two) times daily. Use as instructed bid E11.65 Relion Premier  . insulin glargine (LANTUS SOLOSTAR) 100 UNIT/ML Solostar Pen Inject 14 Units into the skin daily with breakfast.  . Insulin Pen Needle (SURE COMFORT PEN NEEDLES) 32G X 4 MM MISC USING ONCE DAILY.  Marland Kitchen Lancets  MISC 1 each by Does not apply route 2 (two) times daily. E11.65 Relion Premier  . lisinopril-hydrochlorothiazide (ZESTORETIC) 20-12.5 MG tablet Take 1 tablet by mouth daily.  Marland Kitchen nystatin-triamcinolone ointment (MYCOLOG) Apply 1 application topically 2 (two) times daily.  Marland Kitchen SSD 1 % cream Apply topically daily.  . traZODone (DESYREL) 50 MG tablet TAKE ONE TABLET DAILY AT BEDTIME.  . cyclobenzaprine (FLEXERIL) 5 MG tablet  (Patient not taking: Reported on 12/12/2020)   No facility-administered encounter medications on file as of 12/12/2020.    Allergies (verified) Patient has no known allergies.   History: Past Medical History:  Diagnosis Date  . Diabetes mellitus   . Fatty liver   . GERD (gastroesophageal reflux disease)   . HTN (hypertension)   . Hyperlipidemia   . Insomnia    Past Surgical History:  Procedure Laterality Date  . ABDOMINAL AORTOGRAM W/LOWER EXTREMITY N/A 08/22/2018   Procedure: ABDOMINAL AORTOGRAM W/LOWER EXTREMITY;  Surgeon: [name], MD;  Location: [facility] INVASIVE CV LAB;  Service: Cardiovascular;  Laterality: N/A;  . BACK SURGERY    . CARPAL TUNNEL WITH CUBITAL TUNNEL Right 03/21/2019   Procedure: RIGHT CARPAL TUNNEL AND CUBITAL  TUNNEL RELEASES;  Surgeon: [name], MD;  Location: [facility];  Service: Orthopedics;  Laterality: Right;  AXILLARY BLOCK  . COLONOSCOPY  01/2007   Dr. [name], reviewed report, mild diverticulosis. Prep adequate. Next TCS due 01/2017  .  ESOPHAGOGASTRODUODENOSCOPY N/A 10/21/2012   [id number] GASTRITIS  . MASS EXCISION Right 10/26/2012   Procedure: EXCISION MASS;  Surgeon: [name], MD;  Location: [facility];  Service: General;  Laterality: Right;  . PERIPHERAL VASCULAR INTERVENTION  08/22/2018   Procedure: PERIPHERAL VASCULAR INTERVENTION;  Surgeon: [name], MD;  Location: [facility] INVASIVE CV LAB;  Service: Cardiovascular;;  bilateral external iliac stents  . scalp mass  4-5 yrs ago   Dr [name]   . ULNAR NERVE TRANSPOSITION Right 03/21/2019   Procedure: DECOMPRESSION RIGHT ULNAR NERVE;  Surgeon: [name], MD;  Location: [facility];  Service: Orthopedics;  Laterality: Right;   Family History  Problem Relation Age of Onset  . Diabetes Mother   . Diabetes Father   . Diabetes Brother   . Colon cancer Neg Hx   . Liver disease Neg Hx    Social History   Socioeconomic History  . Marital status: Married    Spouse name: Not on file  . Number of children: 2  . Years of education: Not on file  . Highest education level: Not on file  Occupational History  . Occupation: reitred    Comment: [employer], heavy lifting  Tobacco Use  . Smoking status: Current Every Day Smoker    Packs/day: 1.00    Years: 35.00    Pack years: 35.00    Types: Cigarettes  . Smokeless tobacco: Never Used  Vaping Use  . Vaping Use: Never used  Substance and Sexual Activity  . Alcohol use: No    Comment: hx heavy etoh x 3 yrs, quit 30+ yrs ago  . Drug use: No  . Sexual activity: Yes    Birth control/protection: None  Other Topics Concern  . 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 Highest level of education: 12th grade. Married for 12 years. Has 2 sons (33 and 21). Lives with wife.   Social Determinants of Health   Financial Resource Strain: Not on file  Food Insecurity: Not on file  Transportation Needs: Not on file  Physical Activity: Not on file  Stress: Not on file  Social Connections: Not on file    Tobacco Counseling Ready to quit: Not Answered Counseling given: Yes   Clinical Intake:  Pre-visit preparation completed: Yes  Pain : No/denies pain     BMI - recorded: 22.05 Nutritional Status: BMI of 19-24  Normal Nutritional Risks: None Diabetes: Yes CBG done?: No Did pt. bring in CBG monitor from home?: No (134)  How often do you need to have someone help you when you read instructions, pamphlets, or other written  materials from your doctor or pharmacy?: 2 - Rarely What is the last grade level you completed in school?: 12th grade  Diabetic? yes  Interpreter Needed?: No  Information entered by :: Jiles Prows, NP-C   Activities of Daily Living In your present state of health, do you have any difficulty performing the following activities: 12/12/2020  Hearing? N  Vision? N  Difficulty concentrating or making decisions? Y  Walking or climbing stairs? N  Dressing or bathing? N  Doing errands, shopping? N  Preparing Food and eating ? N  Using the Toilet? N  In the past six months, have you accidently leaked urine? N  Do you have problems with loss of bowel control? N  Managing your Medications? N  Managing your Finances? N  Housekeeping or managing your Housekeeping? N  Some recent  data might be hidden    Patient Care Team: [name], MD as PCP - General (Internal Medicine) Wyline Mood Dorothe Pea, MD as PCP - Cardiology (Cardiology) West Bali, MD (Inactive) (Gastroenterology)  Indicate any recent Medical Services you may have received from other than [facility] providers in the past year (date may be approximate).     Assessment:   This is a routine wellness examination for Alekai.  Hearing/Vision screen No exam data present  Dietary issues and exercise activities discussed: Current Exercise Habits: Home exercise routine, Type of exercise: walking, Intensity: Mild, Exercise limited by: orthopedic condition(s);cardiac condition(s)  Goals   None    Depression Screen PHQ 2/9 Scores 12/12/2020 07/31/2020 12/12/2019 11/14/2019 08/31/2017 05/31/2017 02/25/2017  PHQ - 2 Score 0 0 0 0 0 0 0  PHQ- 9 Score 3 0 - - - - -  Exception Documentation - - Medical reason Medical reason - - -    Fall Risk Fall Risk  12/12/2020 12/12/2019 11/14/2019 08/15/2019 01/10/2019  Falls in the past year? 0 0 0 0 0  Number falls in past yr: - 0 0 - -  Injury with Fall? - 0 0 - -  Risk for fall due to : - - No  Fall Risks - -  Follow up - - Falls evaluation completed Falls evaluation completed Falls evaluation completed    FALL RISK PREVENTION PERTAINING TO THE HOME:  Any stairs in or around the home? Yes  If so, are there any without handrails? No  Home free of loose throw rugs in walkways, pet beds, electrical cords, etc? Yes  Adequate lighting in your home to reduce risk of falls? Yes   ASSISTIVE DEVICES UTILIZED TO PREVENT FALLS:  Life alert? No  Use of a cane, walker or w/c? Yes  - cane and wheelchair Grab bars in the bathroom? No  Shower chair or bench in shower? Yes  Elevated toilet seat or a handicapped toilet? No   TIMED UP AND GO:  Was the test performed? Yes .  Length of time to ambulate 10 feet: 11 sec.   Gait steady and fast without use of assistive device  Cognitive Function:     6CIT Screen 12/12/2020 11/14/2019  What Year? 0 points 0 points  What month? 0 points 0 points  What time? 0 points 0 points  Count back from 20 0 points 0 points  Months in reverse 2 points 0 points  Repeat phrase 0 points 0 points  Total Score 2 0    Immunizations Immunization History  Administered Date(s) Administered  . Moderna Sars-Covid-2 Vaccination 01/04/2020, 02/06/2020    TDAP status: Due, Education has been provided regarding the importance of this vaccine. Advised may receive this vaccine at local pharmacy or Health Dept. Aware to provide a copy of the vaccination record if obtained from local pharmacy or Health Dept. Verbalized acceptance and understanding. Patient would not like to have this administered at this time.  Flu Vaccine status: Declined, Education has been provided regarding the importance of this vaccine but patient still declined. Advised may receive this vaccine at local pharmacy or Health Dept. Aware to provide a copy of the vaccination record if obtained from local pharmacy or Health Dept. Verbalized acceptance and understanding.  Pneumococcal vaccine status:  Declined,  Education has been provided regarding the importance of this vaccine but patient still declined. Advised may receive this vaccine at local pharmacy or Health Dept. Aware to provide a copy of the vaccination record  if obtained from local pharmacy or Health Dept. Verbalized acceptance and understanding.   Covid-19 vaccine status: Completed vaccines Due for booster, will consider getting this administered  Qualifies for Shingles Vaccine? Yes   Zostavax completed No   Shingrix Completed?: No.    Education has been provided regarding the importance of this vaccine. Patient has been advised to call insurance company to determine out of pocket expense if they have not yet received this vaccine. Advised may also receive vaccine at local pharmacy or Health Dept. Verbalized acceptance and understanding.  Screening Tests Health Maintenance  Topic Date Due  . TETANUS/TDAP  Never done  . PNA vac Low Risk Adult (1 of 2 - PCV13) Never done  . INFLUENZA VACCINE  Never done  . COVID-19 Vaccine (3 - Booster for Moderna series) 08/08/2020  . COLONOSCOPY (Pts 45-32yrs Insurance coverage will need to be confirmed)  11/16/2020  . FOOT EXAM  12/11/2020  . HEMOGLOBIN A1C  02/21/2021  . OPHTHALMOLOGY EXAM  09/20/2021  . Hepatitis C Screening  Completed  . HPV VACCINES  Aged Out    Health Maintenance  Health Maintenance Due  Topic Date Due  . TETANUS/TDAP  Never done  . PNA vac Low Risk Adult (1 of 2 - PCV13) Never done  . INFLUENZA VACCINE  Never done  . COVID-19 Vaccine (3 - Booster for Moderna series) 08/08/2020  . COLONOSCOPY (Pts 45-73yrs Insurance coverage will need to be confirmed)  11/16/2020  . FOOT EXAM  12/11/2020    Patient prefers to not do the colon cancer screening.   Lung Cancer Screening: (Low Dose CT Chest recommended if Age 17-80 years, 30 pack-year currently smoking OR have quit w/in 15years.) does qualify. Pack year ~39  Lung Cancer Screening Referral: N/A per patient  preference  Additional Screening:  Hepatitis C Screening: does not qualify; Completed 12/2018  Vision Screening: Recommended annual ophthalmology exams for early detection of glaucoma and other disorders of the eye. Is the patient up to date with their annual eye exam?  Yes  Who is the provider or what is the name of the office in which the patient attends annual eye exams? [facility]  If pt is not established with a provider, would they like to be referred to a provider to establish care? No .   Dental Screening: Recommended annual dental exams for proper oral hygiene  Community Resource Referral / Chronic Care Management: CRR required this visit?  No   CCM required this visit?  No      Plan:   He is up-to-date with all vaccines and routine screenings that he is willing to undergo at this time.  He was encouraged to consider the ones that are recommended for a male of his age, and tells me he will let me know if he decides to undergo additional vaccinations or screenings.  I have personally reviewed and noted the following in the patient's chart:   . Medical and social history . Use of alcohol, tobacco or illicit drugs  . Current medications and supplements . Functional ability and status . Nutritional status . Physical activity . Advanced directives . List of other physicians . Hospitalizations, surgeries, and ER visits in previous 12 months . Vitals . Screenings to include cognitive, depression, and falls . Referrals and appointments  In addition, I have reviewed and discussed with patient certain preventive protocols, quality metrics, and best practice recommendations. A written personalized care plan for preventive services as well as general preventive  health recommendations were provided to patient.    He will follow-up in 1 to 3 months for office visit and again in 1 year for annual Medicare wellness visit.  Elenore PaddySARAH E Khaleem Burchill, NP   12/12/2020

## 2020-12-12 NOTE — Patient Instructions (Signed)
  Chase Briggs , Thank you for taking time to come for your Medicare Wellness Visit. I appreciate your ongoing commitment to your health goals. Please review the following plan we discussed and let me know if I can assist you in the future.   These are the goals we discussed: Goals   None     This is a list of the screening recommended for you and due dates:  Health Maintenance  Topic Date Due  . Complete foot exam   12/11/2020  . COVID-19 Vaccine (3 - Booster for Moderna series) 12/28/2020*  . Flu Shot  01/28/2021*  . Colon Cancer Screening  12/12/2021*  . Tetanus Vaccine  12/12/2021*  . Pneumonia vaccines (1 of 2 - PCV13) 12/12/2021*  . Hemoglobin A1C  02/21/2021  . Eye exam for diabetics  09/20/2021  .  Hepatitis C: One time screening is recommended by Center for Disease Control  (CDC) for  adults born from [birth year] through 1965.   Completed  . HPV Vaccine  Aged Out  *Topic was postponed. The date shown is not the original due date.

## 2020-12-18 ENCOUNTER — Other Ambulatory Visit: Payer: Self-pay | Admitting: "Endocrinology

## 2020-12-19 ENCOUNTER — Other Ambulatory Visit (INDEPENDENT_AMBULATORY_CARE_PROVIDER_SITE_OTHER): Payer: Self-pay | Admitting: Internal Medicine

## 2020-12-27 ENCOUNTER — Other Ambulatory Visit (INDEPENDENT_AMBULATORY_CARE_PROVIDER_SITE_OTHER): Payer: Self-pay | Admitting: Internal Medicine

## 2021-01-01 ENCOUNTER — Encounter: Payer: Self-pay | Admitting: Urology

## 2021-01-01 ENCOUNTER — Ambulatory Visit (INDEPENDENT_AMBULATORY_CARE_PROVIDER_SITE_OTHER): Payer: Medicare Other | Admitting: Urology

## 2021-01-01 ENCOUNTER — Other Ambulatory Visit: Payer: Self-pay

## 2021-01-01 VITALS — BP 144/79 | HR 58 | Temp 98.5°F | Ht 65.0 in | Wt 136.0 lb

## 2021-01-01 DIAGNOSIS — N489 Disorder of penis, unspecified: Secondary | ICD-10-CM

## 2021-01-01 LAB — URINALYSIS, ROUTINE W REFLEX MICROSCOPIC
Bilirubin, UA: NEGATIVE
Glucose, UA: NEGATIVE
Ketones, UA: NEGATIVE
Leukocytes,UA: NEGATIVE
Nitrite, UA: NEGATIVE
Specific Gravity, UA: 1.015 (ref 1.005–1.030)
Urobilinogen, Ur: 1 mg/dL (ref 0.2–1.0)
pH, UA: 6 (ref 5.0–7.5)

## 2021-01-01 LAB — MICROSCOPIC EXAMINATION

## 2021-01-01 MED ORDER — NYSTATIN-TRIAMCINOLONE 100000-0.1 UNIT/GM-% EX OINT: 1.0000 "application " | TOPICAL_OINTMENT | Freq: Two times a day (BID) | CUTANEOUS | 1 refills | Status: DC

## 2021-01-01 NOTE — Progress Notes (Signed)
01/01/2021 9:59 AM   Chase Briggs 09-25-1950 [id number]  Referring provider: [name], MD [address] [place],  [place] [address]  followup balanitis  HPI: [name] is a 70yo here for followup for a penile lesion and balanitis. He has been applying nystatin cream BID for 1 month. He notes the lesion and his balanitis. No LUTS. No other complaints today   PMH: Past Medical History:  Diagnosis Date  . Diabetes mellitus   . Fatty liver   . GERD (gastroesophageal reflux disease)   . HTN (hypertension)   . Hyperlipidemia   . Insomnia     Surgical History: Past Surgical History:  Procedure Laterality Date  . ABDOMINAL AORTOGRAM W/LOWER EXTREMITY N/A 08/22/2018   Procedure: ABDOMINAL AORTOGRAM W/LOWER EXTREMITY;  Surgeon: [name], MD;  Location: [facility] INVASIVE CV LAB;  Service: Cardiovascular;  Laterality: N/A;  . BACK SURGERY    . CARPAL TUNNEL WITH CUBITAL TUNNEL Right 03/21/2019   Procedure: RIGHT CARPAL TUNNEL AND CUBITAL  TUNNEL RELEASES;  Surgeon: [name], MD;  Location: [facility];  Service: Orthopedics;  Laterality: Right;  AXILLARY BLOCK  . COLONOSCOPY  01/2007   Dr. [name], reviewed report, mild diverticulosis. Prep adequate. Next TCS due 01/2017  . ESOPHAGOGASTRODUODENOSCOPY N/A 10/21/2012   [id number] GASTRITIS  . MASS EXCISION Right 10/26/2012   Procedure: EXCISION MASS;  Surgeon: [name], MD;  Location: [facility];  Service: General;  Laterality: Right;  . PERIPHERAL VASCULAR INTERVENTION  08/22/2018   Procedure: PERIPHERAL VASCULAR INTERVENTION;  Surgeon: [name], MD;  Location: [facility] INVASIVE CV LAB;  Service: Cardiovascular;;  bilateral external iliac stents  . scalp mass  4-5 yrs ago   Dr [name]  . ULNAR NERVE TRANSPOSITION Right 03/21/2019   Procedure: DECOMPRESSION RIGHT ULNAR NERVE;  Surgeon: [name], MD;  Location: [facility];  Service: Orthopedics;  Laterality: Right;     Home Medications:  Allergies as of 01/01/2021   No Known Allergies     Medication List       Accurate as of January 01, 2021  9:59 AM. If you have any questions, ask your nurse or doctor.        amLODipine 10 MG tablet Commonly known as: NORVASC TAKE (1) TABLET BY MOUTH ONCE DAILY.   aspirin EC 81 MG tablet Take 81 mg by mouth daily.   atorvastatin 10 MG tablet Commonly known as: LIPITOR TAKE ONE TABLET BY MOUTH ONCE DAILY.   cloNIDine 0.3 MG tablet Commonly known as: CATAPRES TAKE 1 TABLET BY MOUTH ONCE DAILY.   clopidogrel 75 MG tablet Commonly known as: PLAVIX TAKE (1) TABLET BY MOUTH ONCE DAILY.   clotrimazole-betamethasone cream Commonly known as: LOTRISONE   cyclobenzaprine 5 MG tablet Commonly known as: FLEXERIL   glucose blood test strip 1 each by Other route 2 (two) times daily. Use as instructed bid E11.65 Relion Premier   Iron 325 (65 Fe) MG Tabs Take 325 mg by mouth daily.   Lancets Misc 1 each by Does not apply route 2 (two) times daily. E11.65 Relion Premier   Lantus SoloStar 100 UNIT/ML Solostar Pen Generic drug: insulin glargine Inject 14 Units into the skin at bedtime.   lisinopril-hydrochlorothiazide 20-12.5 MG tablet Commonly known as: ZESTORETIC Take 1 tablet by mouth daily.   nystatin-triamcinolone ointment Commonly known as: MYCOLOG Apply 1 application topically 2 (two) times daily.   SSD 1 % cream Generic drug: silver sulfADIAZINE Apply topically daily.  Sure Comfort Pen Needles 32G X 4 MM Misc Generic drug: Insulin Pen Needle USING ONCE DAILY.   traZODone 50 MG tablet Commonly known as: DESYREL TAKE ONE TABLET DAILY AT BEDTIME.   Vitamin D 125 MCG (5000 UT) Caps Take 5,000 Units by mouth daily.       Allergies: No Known Allergies  Family History: Family History  Problem Relation Age of Onset  . Diabetes Mother   . Diabetes Father   . Diabetes Brother   . Colon cancer Neg Hx   . Liver disease Neg Hx      Social History:  reports that he has been smoking cigarettes. He has a 35.00 pack-year smoking history. He has never used smokeless tobacco. He reports that he does not drink alcohol and does not use drugs.  ROS: All other review of systems were reviewed and are negative except what is noted above in HPI  Physical Exam: BP (!) 144/79   Pulse (!) 58   Temp 98.5 F (36.9 C)   Ht 5\' 5"  (1.651 m)   Wt 136 lb (61.7 kg)   BMI 22.63 kg/m   Constitutional:  Alert and oriented, No acute distress. HEENT: Leonore AT, moist mucus membranes.  Trachea midline, no masses. Cardiovascular: No clubbing, cyanosis, or edema. Respiratory: Normal respiratory effort, no increased work of breathing. GI: Abdomen is soft, nontender, nondistended, no abdominal masses GU: No CVA tenderness. Uncircumcised phallus. No masses/lesions on penis, testis, scrotum.  Lymph: No cervical or inguinal lymphadenopathy. Skin: No rashes, bruises or suspicious lesions. Neurologic: Grossly intact, no focal deficits, moving all 4 extremities. Psychiatric: Normal mood and affect.  Laboratory Data: Lab Results  Component Value Date   WBC 7.2 09/16/2018   HGB 12.8 (L) 09/16/2018   HCT 39.5 09/16/2018   MCV 94.5 09/16/2018   PLT 202 09/16/2018    Lab Results  Component Value Date   CREATININE 1.34 (H) 03/28/2020    No results found for: PSA  No results found for: TESTOSTERONE  Lab Results  Component Value Date   HGBA1C 7.5 (A) 08/23/2020    Urinalysis    Component Value Date/Time   COLORURINE STRAW (A) 09/16/2018 0851   APPEARANCEUR Clear 09/23/2020 0855   LABSPEC 1.005 09/16/2018 0851   PHURINE 6.0 09/16/2018 0851   GLUCOSEU Negative 09/23/2020 0855   HGBUR SMALL (A) 09/16/2018 0851   BILIRUBINUR Negative 09/23/2020 0855   KETONESUR NEGATIVE 09/16/2018 0851   PROTEINUR 2+ (A) 09/23/2020 0855   PROTEINUR NEGATIVE 09/16/2018 0851   UROBILINOGEN 0.2 02/19/2014 1340   NITRITE Negative 09/23/2020 0855    NITRITE NEGATIVE 09/16/2018 0851   LEUKOCYTESUR Negative 09/23/2020 0855    Lab Results  Component Value Date   LABMICR See below: 09/23/2020   WBCUA None seen 09/23/2020   LABEPIT None seen 09/23/2020   MUCUS Present 09/23/2020   BACTERIA None seen 09/23/2020    Pertinent Imaging:  No results found for this or any previous visit.  No results found for this or any previous visit.  No results found for this or any previous visit.  No results found for this or any previous visit.  No results found for this or any previous visit.  No results found for this or any previous visit.  No results found for this or any previous visit.  No results found for this or any previous visit.   Assessment & Plan:    1. Penile lesion -nystatin cream prn. RTC 6 months  - Urinalysis, Routine w  reflex microscopic - nystatin-triamcinolone ointment (MYCOLOG); Apply 1 application topically 2 (two) times daily.  Dispense: 30 g; Refill: 1   No follow-ups on file.  Wilkie Aye, MD  [facility] [place]

## 2021-01-01 NOTE — Progress Notes (Signed)
Urological Symptom Review  Patient is experiencing the following symptoms: Hard to postpone urination Erection problems (male only)   Review of Systems  Gastrointestinal (upper)  : Negative for upper GI symptoms  Gastrointestinal (lower) : Negative for lower GI symptoms  Constitutional : Negative for symptoms  Skin: Negative for skin symptoms  Eyes: Negative for eye symptoms  Ear/Nose/Throat : Negative for Ear/Nose/Throat symptoms  Hematologic/Lymphatic: Negative for Hematologic/Lymphatic symptoms  Cardiovascular : Negative for cardiovascular symptoms  Respiratory : Negative for respiratory symptoms  Endocrine: Negative for endocrine symptoms  Musculoskeletal: Back pain Joint pain  Neurological: Negative for neurological symptoms  Psychologic: Negative for psychiatric symptoms 

## 2021-01-20 ENCOUNTER — Encounter (INDEPENDENT_AMBULATORY_CARE_PROVIDER_SITE_OTHER): Payer: Self-pay | Admitting: Internal Medicine

## 2021-01-20 ENCOUNTER — Other Ambulatory Visit: Payer: Self-pay

## 2021-01-20 ENCOUNTER — Ambulatory Visit (INDEPENDENT_AMBULATORY_CARE_PROVIDER_SITE_OTHER): Payer: Medicare Other | Admitting: Internal Medicine

## 2021-01-20 VITALS — BP 110/64 | HR 88 | Temp 97.1°F | Ht 65.0 in | Wt 130.0 lb

## 2021-01-20 DIAGNOSIS — R5381 Other malaise: Secondary | ICD-10-CM | POA: Diagnosis not present

## 2021-01-20 DIAGNOSIS — R5383 Other fatigue: Secondary | ICD-10-CM | POA: Diagnosis not present

## 2021-01-20 DIAGNOSIS — R630 Anorexia: Secondary | ICD-10-CM

## 2021-01-20 DIAGNOSIS — R197 Diarrhea, unspecified: Secondary | ICD-10-CM

## 2021-01-20 DIAGNOSIS — R194 Change in bowel habit: Secondary | ICD-10-CM

## 2021-01-20 NOTE — Progress Notes (Signed)
Metrics: Intervention Frequency ACO  Documented Smoking Status Yearly  Screened one or more times in 24 months  Cessation Counseling or  Active cessation medication Past 24 months  Past 24 months   Guideline developer: UpToDate (See UpToDate for funding source) Date Released: 2014       Wellness Office Visit  Subjective:  Patient ID: Chase Briggs, male    DOB: 15-May-1951  Age: 70 y.o. MRN: [id number]  CC: Diarrhea, anorexia, fatigue. HPI  The patient started 4 days ago with loose green stools.  On closer questioning, it sounds like he had diarrhea.  He cannot be sure if his stools were also black which is a possibility.  He feels very tired now.  He does not have any abdominal pain.  He did have some urinary hesitancy but this seems to not be a problem anymore.  He has not had a fever. He denies eating anything out of the ordinary that might of caused the possible diarrhea. Past Medical History:  Diagnosis Date  . Diabetes mellitus   . Fatty liver   . GERD (gastroesophageal reflux disease)   . HTN (hypertension)   . Hyperlipidemia   . Insomnia    Past Surgical History:  Procedure Laterality Date  . ABDOMINAL AORTOGRAM W/LOWER EXTREMITY N/A 08/22/2018   Procedure: ABDOMINAL AORTOGRAM W/LOWER EXTREMITY;  Surgeon: [name], MD;  Location: [facility] INVASIVE CV LAB;  Service: Cardiovascular;  Laterality: N/A;  . BACK SURGERY    . CARPAL TUNNEL WITH CUBITAL TUNNEL Right 03/21/2019   Procedure: RIGHT CARPAL TUNNEL AND CUBITAL  TUNNEL RELEASES;  Surgeon: [name], MD;  Location: [facility];  Service: Orthopedics;  Laterality: Right;  AXILLARY BLOCK  . COLONOSCOPY  01/2007   Dr. [name], reviewed report, mild diverticulosis. Prep adequate. Next TCS due 01/2017  . ESOPHAGOGASTRODUODENOSCOPY N/A 10/21/2012   [id number] GASTRITIS  . MASS EXCISION Right 10/26/2012   Procedure: EXCISION MASS;  Surgeon: [name], MD;  Location: [facility];  Service: General;   Laterality: Right;  . PERIPHERAL VASCULAR INTERVENTION  08/22/2018   Procedure: PERIPHERAL VASCULAR INTERVENTION;  Surgeon: [name], MD;  Location: [facility] INVASIVE CV LAB;  Service: Cardiovascular;;  bilateral external iliac stents  . scalp mass  4-5 yrs ago   Dr [name]  . ULNAR NERVE TRANSPOSITION Right 03/21/2019   Procedure: DECOMPRESSION RIGHT ULNAR NERVE;  Surgeon: [name], MD;  Location:  SURGERY CENTER;  Service: Orthopedics;  Laterality: Right;     Family History  Problem Relation Age of Onset  . Diabetes Mother   . Diabetes Father   . Diabetes Brother   . Colon cancer Neg Hx   . 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 Married for 12 years. Has 2 sons (33 and 21). Lives with wife.   Social History   Tobacco Use  . Smoking status: Current Every Day Smoker    Packs/day: 1.00    Years: 35.00    Pack years: 35.00    Types: Cigarettes  . Smokeless tobacco: Never Used  Substance Use Topics  . 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 amLODipine (NORVASC) 10 MG tablet TAKE (1) TABLET BY MOUTH ONCE DAILY.  Marland Kitchen aspirin EC 81 MG tablet Take  81 mg by mouth daily.  Marland Kitchen atorvastatin (LIPITOR) 10 MG tablet TAKE ONE TABLET BY MOUTH ONCE DAILY.  Marland Kitchen Cholecalciferol (VITAMIN D) 125 MCG (5000 UT) CAPS Take 5,000 Units by mouth daily.   . cloNIDine (CATAPRES) 0.3 MG tablet TAKE 1 TABLET BY MOUTH ONCE DAILY.  Marland Kitchen clopidogrel (PLAVIX) 75 MG tablet TAKE (1) TABLET BY MOUTH ONCE DAILY.  Marland Kitchen Ferrous Sulfate (IRON) 325 (65 Fe) MG TABS Take 325 mg by mouth daily.  Marland Kitchen glucose blood test strip 1 each by Other route 2 (two) times daily. Use as instructed bid E11.65 Relion Premier  . insulin glargine (LANTUS SOLOSTAR) 100 UNIT/ML Solostar Pen Inject 14 Units into the skin at bedtime.  . Insulin Pen Needle  (SURE COMFORT PEN NEEDLES) 32G X 4 MM MISC USING ONCE DAILY.  Marland Kitchen Lancets MISC 1 each by Does not apply route 2 (two) times daily. E11.65 Relion Premier  . lisinopril-hydrochlorothiazide (ZESTORETIC) 20-12.5 MG tablet Take 1 tablet by mouth daily.  . traZODone (DESYREL) 50 MG tablet TAKE ONE TABLET DAILY AT BEDTIME.     Flowsheet Row Clinical Support from 12/12/2020 in [facility]  PHQ-9 Total Score 3      Objective:   Today's Vitals: BP 110/64 (BP Location: Right Arm, Patient Position: Sitting, Cuff Size: Small)   Pulse 88   Temp (!) 97.1 F (36.2 C) (Temporal)   Ht 5\' 5"  (1.651 m)   Wt 130 lb (59 kg)   SpO2 96%   BMI 21.63 kg/m  Vitals with BMI 01/20/2021 01/01/2021 12/12/2020  Height 5\' 5"  5\' 5"  5' 5.75"  Weight 130 lbs 136 lbs 135 lbs 10 oz  BMI 21.63 22.63 22.05  Systolic 110 144 12/14/2020  Diastolic 64 79 78  Pulse 88 58 66     Physical Exam  Although he has lost 6 pounds since the last visit, he does not look cachectic, he does not look unwell.  He is afebrile.  His abdomen is soft and nontender.     Assessment   1. Change in bowel habit   2. Anorexia   3. Diarrhea, unspecified type   4. Malaise and fatigue       Tests ordered Orders Placed This Encounter  Procedures  . CBC  . COMPLETE METABOLIC PANEL WITH GFR     Plan: 1. His whole history sounds like an infectious type of diarrhea which is probably not severe and going to be improving.  However, the possibility of black stools is somewhat concerning.  I will check some blood work to make sure everything is okay and I have encouraged him to make sure he stays hydrated even though he does not feel like eating at the present time.  I have also told him that if things get worse or do not get better, he should let know. 2. Further recommendations will depend on blood results.   No orders of the defined types were placed in this encounter.   , MD

## 2021-01-21 ENCOUNTER — Emergency Department (HOSPITAL_COMMUNITY)
Admission: EM | Admit: 2021-01-21 | Discharge: 2021-01-22 | Disposition: A | Discharge status: Home / Self Care | Payer: Medicare Other | Attending: Emergency Medicine | Admitting: Emergency Medicine

## 2021-01-21 ENCOUNTER — Other Ambulatory Visit: Payer: Self-pay

## 2021-01-21 ENCOUNTER — Emergency Department (HOSPITAL_COMMUNITY): Payer: Medicare Other

## 2021-01-21 ENCOUNTER — Encounter (HOSPITAL_COMMUNITY): Payer: Self-pay | Admitting: Emergency Medicine

## 2021-01-21 DIAGNOSIS — R21 Rash and other nonspecific skin eruption: Secondary | ICD-10-CM | POA: Diagnosis not present

## 2021-01-21 DIAGNOSIS — I10 Essential (primary) hypertension: Secondary | ICD-10-CM | POA: Diagnosis not present

## 2021-01-21 DIAGNOSIS — F17210 Nicotine dependence, cigarettes, uncomplicated: Secondary | ICD-10-CM | POA: Diagnosis not present

## 2021-01-21 DIAGNOSIS — E119 Type 2 diabetes mellitus without complications: Secondary | ICD-10-CM | POA: Diagnosis not present

## 2021-01-21 DIAGNOSIS — R5383 Other fatigue: Secondary | ICD-10-CM | POA: Diagnosis not present

## 2021-01-21 DIAGNOSIS — U071 COVID-19: Secondary | ICD-10-CM | POA: Insufficient documentation

## 2021-01-21 DIAGNOSIS — R531 Weakness: Secondary | ICD-10-CM | POA: Diagnosis present

## 2021-01-21 DIAGNOSIS — R9431 Abnormal electrocardiogram [ECG] [EKG]: Secondary | ICD-10-CM | POA: Diagnosis not present

## 2021-01-21 DIAGNOSIS — R5381 Other malaise: Secondary | ICD-10-CM

## 2021-01-21 LAB — COMPLETE METABOLIC PANEL WITH GFR
AG Ratio: 1.3 (calc) (ref 1.0–2.5)
ALT: 17 U/L (ref 9–46)
AST: 22 U/L (ref 10–35)
Albumin: 4 g/dL (ref 3.6–5.1)
Alkaline phosphatase (APISO): 82 U/L (ref 35–144)
BUN/Creatinine Ratio: 13 (calc) (ref 6–22)
BUN: 25 mg/dL (ref 7–25)
CO2: 27 mmol/L (ref 20–32)
Calcium: 8.7 mg/dL (ref 8.6–10.3)
Chloride: 98 mmol/L (ref 98–110)
Creat: 1.95 mg/dL — ABNORMAL HIGH (ref 0.70–1.25)
GFR, Est African American: 40 mL/min/{1.73_m2} — ABNORMAL LOW (ref >=60)
GFR, Est Non African American: 34 mL/min/{1.73_m2} — ABNORMAL LOW (ref >=60)
Globulin: 3.2 g/dL (calc) (ref 1.9–3.7)
Glucose, Bld: 274 mg/dL — ABNORMAL HIGH (ref 65–139)
Potassium: 4.1 mmol/L (ref 3.5–5.3)
Sodium: 132 mmol/L — ABNORMAL LOW (ref 135–146)
Total Bilirubin: 0.6 mg/dL (ref 0.2–1.2)
Total Protein: 7.2 g/dL (ref 6.1–8.1)

## 2021-01-21 LAB — CBC
HCT: 41.7 % (ref 38.5–50.0)
HCT: 41.1 % (ref 39.0–52.0)
Hemoglobin: 13.6 g/dL (ref 13.2–17.1)
Hemoglobin: 13.9 g/dL (ref 13.0–17.0)
MCH: 31.7 pg (ref 27.0–33.0)
MCH: 32.5 pg (ref 26.0–34.0)
MCHC: 32.6 g/dL (ref 32.0–36.0)
MCHC: 33.8 g/dL (ref 30.0–36.0)
MCV: 97.2 fL (ref 80.0–100.0)
MCV: 96 fL (ref 80.0–100.0)
MPV: 11.2 fL (ref 7.5–12.5)
Platelets: 132 10*3/uL — ABNORMAL LOW (ref 140–400)
Platelets: 120 10*3/uL — ABNORMAL LOW (ref 150–400)
RBC: 4.29 10*6/uL (ref 4.20–5.80)
RBC: 4.28 MIL/uL (ref 4.22–5.81)
RDW: 12.7 % (ref 11.0–15.0)
RDW: 13.3 % (ref 11.5–15.5)
WBC: 4.7 10*3/uL (ref 3.8–10.8)
WBC: 12.6 10*3/uL — ABNORMAL HIGH (ref 4.0–10.5)
nRBC: 0 % (ref 0.0–0.2)

## 2021-01-21 LAB — BASIC METABOLIC PANEL
Anion gap: 12 (ref 5–15)
BUN: 18 mg/dL (ref 8–23)
CO2: 24 mmol/L (ref 22–32)
Calcium: 8.9 mg/dL (ref 8.9–10.3)
Chloride: 92 mmol/L — ABNORMAL LOW (ref 98–111)
Creatinine, Ser: 1.6 mg/dL — ABNORMAL HIGH (ref 0.61–1.24)
GFR, Estimated: 46 mL/min — ABNORMAL LOW (ref >60)
Glucose, Bld: 182 mg/dL — ABNORMAL HIGH (ref 70–99)
Potassium: 4.2 mmol/L (ref 3.5–5.1)
Sodium: 128 mmol/L — ABNORMAL LOW (ref 135–145)

## 2021-01-21 LAB — CBG MONITORING, ED: Glucose-Capillary: 206 mg/dL — ABNORMAL HIGH (ref 70–99)

## 2021-01-21 MED ORDER — SODIUM CHLORIDE 0.9 % IV BOLUS
1000.0000 mL | Freq: Once | INTRAVENOUS | Status: AC
  Administered 2021-01-21: 1000 mL via INTRAVENOUS

## 2021-01-21 NOTE — ED Triage Notes (Signed)
Pt has not been eating well and per family is real weak. Pt was seen by pcp for the same and diagnosed with dehydration.

## 2021-01-21 NOTE — Progress Notes (Signed)
Please call this patient and let him know that he is getting more dehydrated and his diabetes is worse.  He needs to drink plenty of water every day, approximately 1 gallon.  Follow-up as scheduled

## 2021-01-22 DIAGNOSIS — U071 COVID-19: Secondary | ICD-10-CM | POA: Diagnosis not present

## 2021-01-22 DIAGNOSIS — R5383 Other fatigue: Secondary | ICD-10-CM | POA: Diagnosis not present

## 2021-01-22 DIAGNOSIS — R21 Rash and other nonspecific skin eruption: Secondary | ICD-10-CM | POA: Diagnosis not present

## 2021-01-22 LAB — RESP PANEL BY RT-PCR (FLU A&B, COVID) ARPGX2
Influenza A by PCR: NEGATIVE
Influenza B by PCR: NEGATIVE
SARS Coronavirus 2 by RT PCR: POSITIVE — AB

## 2021-01-22 NOTE — ED Notes (Signed)
Date and time results received: 01/22/21 0040 (use smartphrase ".now" to insert current time)  Test: covid Critical Value: POSITIVE +  Name of Provider Notified: Dr. Pilar Plate  Orders Received? Or Actions Taken?: no new orders

## 2021-01-22 NOTE — Discharge Instructions (Addendum)
You were evaluated in the Emergency Department and after careful evaluation, we did not find any emergent condition requiring admission or further testing in the hospital.  Your exam/testing today was overall reassuring.  Your symptoms seem to be due to COVID-19.  You have tested positive here in the emergency department.   Please isolate or quarantine at home until you receive a negative test result.  If positive, we recommend continued home quarantine per [facility] recommendations.  Recommend Tylenol, Motrin for discomfort, plenty of fluids at home.   Please return to the Emergency Department if you experience any worsening of your condition.  Thank you for allowing [place] to be a part of your care.

## 2021-01-22 NOTE — ED Notes (Signed)
This NT ambulated the pt, the lowest when stood up was 94, after standing the o2 was stating 97.

## 2021-01-22 NOTE — ED Provider Notes (Signed)
[facility] Emergency Department Provider Note MRN:  [id number]  Arrival date & time: 01/22/21     Chief Complaint   Weakness   History of Present Illness   Chase Briggs is a 70 y.o. year-old male with a history of hypertension, diabetes presenting to the ED with chief complaint of weakness.  2 days of general malaise, weakness, low energy, not eating or drinking well denies headache or vision change, no chest pain or shortness of breath, no abdominal pain, no nausea vomiting or diarrhea.  Feels dehydrated.  Symptoms constant, moderate, no exacerbating or alleviating factors.  Review of Systems  A complete 10 system review of systems was obtained and all systems are negative except as noted in the HPI and PMH.   Patient's Health History    Past Medical History:  Diagnosis Date  . Diabetes mellitus   . Fatty liver   . GERD (gastroesophageal reflux disease)   . HTN (hypertension)   . Hyperlipidemia   . Insomnia     Past Surgical History:  Procedure Laterality Date  . ABDOMINAL AORTOGRAM W/LOWER EXTREMITY N/A 08/22/2018   Procedure: ABDOMINAL AORTOGRAM W/LOWER EXTREMITY;  Surgeon: [name], MD;  Location: [facility] INVASIVE CV LAB;  Service: Cardiovascular;  Laterality: N/A;  . BACK SURGERY    . CARPAL TUNNEL WITH CUBITAL TUNNEL Right 03/21/2019   Procedure: RIGHT CARPAL TUNNEL AND CUBITAL  TUNNEL RELEASES;  Surgeon: [name], MD;  Location: [facility];  Service: Orthopedics;  Laterality: Right;  AXILLARY BLOCK  . COLONOSCOPY  01/2007   Dr. [name], reviewed report, mild diverticulosis. Prep adequate. Next TCS due 01/2017  . ESOPHAGOGASTRODUODENOSCOPY N/A 10/21/2012   [id number] GASTRITIS  . MASS EXCISION Right 10/26/2012   Procedure: EXCISION MASS;  Surgeon: [name], MD;  Location: [facility];  Service: General;  Laterality: Right;  . PERIPHERAL VASCULAR INTERVENTION  08/22/2018   Procedure: PERIPHERAL VASCULAR  INTERVENTION;  Surgeon: [name], MD;  Location: [facility] INVASIVE CV LAB;  Service: Cardiovascular;;  bilateral external iliac stents  . scalp mass  4-5 yrs ago   Dr [name]  . ULNAR NERVE TRANSPOSITION Right 03/21/2019   Procedure: DECOMPRESSION RIGHT ULNAR NERVE;  Surgeon: [name], MD;  Location: [facility];  Service: Orthopedics;  Laterality: Right;    Family History  Problem Relation Age of Onset  . Diabetes Mother   . Diabetes Father   . Diabetes Brother   . Colon cancer Neg Hx   . Liver disease Neg Hx     Social History   Socioeconomic History  . Marital status: Married    Spouse name: Not on file  . Number of children: 2  . Years of education: Not on file  . Highest education level: Not on file  Occupational History  . Occupation: reitred    Comment: [employer], heavy lifting  Tobacco Use  . Smoking status: Current Every Day Smoker    Packs/day: 1.00    Years: 35.00    Pack years: 35.00    Types: Cigarettes  . Smokeless tobacco: Never Used  Vaping Use  . Vaping Use: Never used  Substance and Sexual Activity  . Alcohol use: No    Comment: hx heavy etoh x 3 yrs, quit 30+ yrs ago  . Drug use: No  . Sexual activity: Yes    Birth control/protection: None  Other Topics Concern  . 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 Highest level of education: 12th grade. Married for 12 years. Has 2 sons (33 and 21). Lives with wife.   Social Determinants of Health   Financial Resource Strain: Not on file  Food Insecurity: Not on file  Transportation Needs: Not on file  Physical Activity: Not on file  Stress: Not on file  Social Connections: Not on file  Intimate Partner Violence: Not on file     Physical Exam   Vitals:   01/22/21 0000 01/22/21 0136  BP: 131/86 (!) 153/72  Pulse: 93 95  Resp: (!) 29 (!) 21  Temp:    SpO2: 96% 96%    CONSTITUTIONAL: Well-appearing, NAD NEURO:  Alert and oriented x 3,  no focal deficits EYES:  eyes equal and reactive ENT/NECK:  no LAD, no JVD CARDIO: Regular rate, well-perfused, normal S1 and S2 PULM:  CTAB no wheezing or rhonchi GI/GU:  normal bowel sounds, non-distended, non-tender MSK/SPINE:  No gross deformities, no edema SKIN:  no rash, atraumatic PSYCH:  Appropriate speech and behavior  *Additional and/or pertinent findings included in MDM below  Diagnostic and Interventional Summary    EKG Interpretation  Date/Time:  Tuesday Jan 21 2021 19:48:43 EDT Ventricular Rate:  100 PR Interval:  134 QRS Duration: 76 QT Interval:  326 QTC Calculation: 420 R Axis:   -24 Text Interpretation: Normal sinus rhythm Right atrial enlargement Borderline ECG Confirmed by Kennis Carina [phone]) on 01/21/2021 11:21:21 PM      Labs Reviewed  RESP PANEL BY RT-PCR (FLU A&B, COVID) ARPGX2 - Abnormal; Notable for the following components:      Result Value   SARS Coronavirus 2 by RT PCR POSITIVE (*)    All other components within normal limits  BASIC METABOLIC PANEL - Abnormal; Notable for the following components:   Sodium 128 (*)    Chloride 92 (*)    Glucose, Bld 182 (*)    Creatinine, Ser 1.60 (*)    GFR, Estimated 46 (*)    All other components within normal limits  CBC - Abnormal; Notable for the following components:   WBC 12.6 (*)    Platelets 120 (*)    All other components within normal limits  CBG MONITORING, ED - Abnormal; Notable for the following components:   Glucose-Capillary 206 (*)    All other components within normal limits  URINALYSIS, ROUTINE W REFLEX MICROSCOPIC    DG Chest 2 View  Final Result      Medications  sodium chloride 0.9 % bolus 1,000 mL (0 mLs Intravenous Stopped 01/22/21 0136)     Procedures  /  Critical Care Procedures  ED Course and Medical Decision Making  I have reviewed the triage vital signs, the nursing notes, and pertinent available records from the EMR.  Listed above are laboratory and imaging tests  that I personally ordered, reviewed, and interpreted and then considered in my medical decision making (see below for details).  Suspect dehydration due to some type of viral illness, considering COVID-19.  Swab is pending.  Patient with temperature 100 orally on arrival, will initiate infectious work-up with chest x-ray and urinalysis.  Providing IV fluids to help correct the low sodium and dehydration.     Patient looking better on my reassessment.  He is able to ambulate without any oxygen desaturations.  Work-up is overall reassuring, has tested positive for COVID, which explains symptoms.  No indication for further testing or admission, appropriate for discharge.  Strict return precautions for shortness of  breath.  Chase Briggs was evaluated in Emergency Department on 01/22/2021 for the symptoms described in the history of present illness. He was evaluated in the context of the global COVID-19 pandemic, which necessitated consideration that the patient might be at risk for infection with the SARS-CoV-2 virus that causes COVID-19. Institutional protocols and algorithms that pertain to the evaluation of patients at risk for COVID-19 are in a state of rapid change based on information released by regulatory bodies including the CDC and federal and state organizations. These policies and algorithms were followed during the patient's care in the ED.   Elmer Sow. Pilar Plate, MD [facility] Emergency Medicine [facility] [email or web address]  Final Clinical Impressions(s) / ED Diagnoses     ICD-10-CM   1. COVID-19  U07.1   2. Malaise and fatigue  R53.81 DG Chest 2 View   R53.83 DG Chest 2 View    ED Discharge Orders         Ordered    Ambulatory referral for Covid Treatment        01/22/21 0210           Discharge Instructions Discussed with and Provided to Patient:     Discharge Instructions     You were evaluated in the Emergency Department and after careful  evaluation, we did not find any emergent condition requiring admission or further testing in the hospital.  Your exam/testing today was overall reassuring.  Your symptoms seem to be due to COVID-19.  You have tested positive here in the emergency department.   Please isolate or quarantine at home until you receive a negative test result.  If positive, we recommend continued home quarantine per [facility] recommendations.  Recommend Tylenol, Motrin for discomfort, plenty of fluids at home.   Please return to the Emergency Department if you experience any worsening of your condition.  Thank you for allowing [place] to be a part of your care.        Sabas Sous, MD 01/22/21 [phone]

## 2021-01-23 ENCOUNTER — Telehealth: Payer: Self-pay | Admitting: Unknown Physician Specialty

## 2021-01-23 ENCOUNTER — Telehealth: Payer: Self-pay

## 2021-01-23 NOTE — Telephone Encounter (Signed)
Called to discuss with patient about COVID-19 symptoms and the use of one of the available treatments for those with mild to moderate Covid symptoms and at a high risk of hospitalization.  Pt appears to qualify for outpatient treatment due to co-morbid conditions and/or a member of an at-risk group in accordance with the FDA Emergency Use Authorization.    Symptom onset: 5/5  Pt is out of the treatment window for current effective Covid therapies (5 days oral, 7 days IV)   Gabriel Cirri

## 2021-01-23 NOTE — Telephone Encounter (Signed)
Called to discuss with patient about COVID-19 symptoms and the use of one of the available treatments for those with mild to moderate Covid symptoms and at a high risk of hospitalization.  Pt appears to qualify for outpatient treatment due to co-morbid conditions and/or a member of an at-risk group in accordance with the FDA Emergency Use Authorization.    Symptom onset: 01/16/21 Fatigue,low grade fever,decreased appetite Vaccinated: Yes Booster? No Immunocompromised? No Qualifiers: DM,HTN NIH Criteria: Tier 1   Pt. Would like to speak with APP.   Esther Hardy

## 2021-01-28 ENCOUNTER — Telehealth (INDEPENDENT_AMBULATORY_CARE_PROVIDER_SITE_OTHER): Payer: Self-pay

## 2021-01-28 ENCOUNTER — Emergency Department (HOSPITAL_COMMUNITY): Payer: Medicare Other

## 2021-01-28 ENCOUNTER — Encounter (HOSPITAL_COMMUNITY): Payer: Self-pay | Admitting: *Deleted

## 2021-01-28 ENCOUNTER — Other Ambulatory Visit: Payer: Self-pay

## 2021-01-28 ENCOUNTER — Telehealth: Payer: Self-pay | Admitting: "Endocrinology

## 2021-01-28 ENCOUNTER — Emergency Department (HOSPITAL_COMMUNITY)
Admission: EM | Admit: 2021-01-28 | Discharge: 2021-01-28 | Disposition: A | Discharge status: Home / Self Care | Payer: Medicare Other | Attending: Emergency Medicine | Admitting: Emergency Medicine

## 2021-01-28 DIAGNOSIS — E1122 Type 2 diabetes mellitus with diabetic chronic kidney disease: Secondary | ICD-10-CM | POA: Diagnosis not present

## 2021-01-28 DIAGNOSIS — I10 Essential (primary) hypertension: Secondary | ICD-10-CM | POA: Diagnosis not present

## 2021-01-28 DIAGNOSIS — F17210 Nicotine dependence, cigarettes, uncomplicated: Secondary | ICD-10-CM | POA: Diagnosis not present

## 2021-01-28 DIAGNOSIS — U071 COVID-19: Secondary | ICD-10-CM | POA: Diagnosis not present

## 2021-01-28 DIAGNOSIS — I129 Hypertensive chronic kidney disease with stage 1 through stage 4 chronic kidney disease, or unspecified chronic kidney disease: Secondary | ICD-10-CM | POA: Diagnosis not present

## 2021-01-28 DIAGNOSIS — N183 Chronic kidney disease, stage 3 unspecified: Secondary | ICD-10-CM | POA: Diagnosis not present

## 2021-01-28 DIAGNOSIS — R630 Anorexia: Secondary | ICD-10-CM | POA: Insufficient documentation

## 2021-01-28 DIAGNOSIS — R5383 Other fatigue: Secondary | ICD-10-CM | POA: Diagnosis not present

## 2021-01-28 DIAGNOSIS — Z7982 Long term (current) use of aspirin: Secondary | ICD-10-CM | POA: Diagnosis not present

## 2021-01-28 DIAGNOSIS — Z794 Long term (current) use of insulin: Secondary | ICD-10-CM | POA: Diagnosis not present

## 2021-01-28 DIAGNOSIS — R0602 Shortness of breath: Secondary | ICD-10-CM

## 2021-01-28 DIAGNOSIS — N3001 Acute cystitis with hematuria: Secondary | ICD-10-CM | POA: Diagnosis not present

## 2021-01-28 DIAGNOSIS — Z79899 Other long term (current) drug therapy: Secondary | ICD-10-CM | POA: Insufficient documentation

## 2021-01-28 DIAGNOSIS — R300 Dysuria: Secondary | ICD-10-CM | POA: Diagnosis not present

## 2021-01-28 DIAGNOSIS — M47816 Spondylosis without myelopathy or radiculopathy, lumbar region: Secondary | ICD-10-CM | POA: Diagnosis not present

## 2021-01-28 DIAGNOSIS — N200 Calculus of kidney: Secondary | ICD-10-CM | POA: Diagnosis not present

## 2021-01-28 DIAGNOSIS — K5790 Diverticulosis of intestine, part unspecified, without perforation or abscess without bleeding: Secondary | ICD-10-CM | POA: Diagnosis not present

## 2021-01-28 LAB — CBC WITH DIFFERENTIAL/PLATELET
Abs Immature Granulocytes: 0.1 10*3/uL — ABNORMAL HIGH (ref 0.00–0.07)
Basophils Absolute: 0 10*3/uL (ref 0.0–0.1)
Basophils Relative: 0 %
Eosinophils Absolute: 0 10*3/uL (ref 0.0–0.5)
Eosinophils Relative: 0 %
HCT: 37 % — ABNORMAL LOW (ref 39.0–52.0)
Hemoglobin: 12.4 g/dL — ABNORMAL LOW (ref 13.0–17.0)
Immature Granulocytes: 1 %
Lymphocytes Relative: 19 %
Lymphs Abs: 1.5 10*3/uL (ref 0.7–4.0)
MCH: 32 pg (ref 26.0–34.0)
MCHC: 33.5 g/dL (ref 30.0–36.0)
MCV: 95.4 fL (ref 80.0–100.0)
Monocytes Absolute: 0.7 10*3/uL (ref 0.1–1.0)
Monocytes Relative: 10 %
Neutro Abs: 5.4 10*3/uL (ref 1.7–7.7)
Neutrophils Relative %: 70 %
Platelets: 386 10*3/uL (ref 150–400)
RBC: 3.88 MIL/uL — ABNORMAL LOW (ref 4.22–5.81)
RDW: 12.9 % (ref 11.5–15.5)
WBC: 7.8 10*3/uL (ref 4.0–10.5)
nRBC: 0 % (ref 0.0–0.2)

## 2021-01-28 LAB — URINALYSIS, ROUTINE W REFLEX MICROSCOPIC
Bilirubin Urine: NEGATIVE
Glucose, UA: 50 mg/dL — AB
Ketones, ur: NEGATIVE mg/dL
Nitrite: NEGATIVE
Protein, ur: NEGATIVE mg/dL
Specific Gravity, Urine: 1.005 (ref 1.005–1.030)
WBC, UA: >50 WBC/hpf — ABNORMAL HIGH (ref 0–5)
pH: 6 (ref 5.0–8.0)

## 2021-01-28 LAB — COMPREHENSIVE METABOLIC PANEL
ALT: 28 U/L (ref 0–44)
AST: 25 U/L (ref 15–41)
Albumin: 3.4 g/dL — ABNORMAL LOW (ref 3.5–5.0)
Alkaline Phosphatase: 80 U/L (ref 38–126)
Anion gap: 11 (ref 5–15)
BUN: 13 mg/dL (ref 8–23)
CO2: 27 mmol/L (ref 22–32)
Calcium: 9.2 mg/dL (ref 8.9–10.3)
Chloride: 91 mmol/L — ABNORMAL LOW (ref 98–111)
Creatinine, Ser: 1.22 mg/dL (ref 0.61–1.24)
GFR, Estimated: >60 mL/min (ref >60)
Glucose, Bld: 275 mg/dL — ABNORMAL HIGH (ref 70–99)
Potassium: 3.5 mmol/L (ref 3.5–5.1)
Sodium: 129 mmol/L — ABNORMAL LOW (ref 135–145)
Total Bilirubin: 0.6 mg/dL (ref 0.3–1.2)
Total Protein: 8.2 g/dL — ABNORMAL HIGH (ref 6.5–8.1)

## 2021-01-28 LAB — CBG MONITORING, ED: Glucose-Capillary: 308 mg/dL — ABNORMAL HIGH (ref 70–99)

## 2021-01-28 MED ORDER — SODIUM CHLORIDE 0.9 % IV BOLUS
1000.0000 mL | Freq: Once | INTRAVENOUS | Status: AC
  Administered 2021-01-28: 1000 mL via INTRAVENOUS

## 2021-01-28 MED ORDER — CEPHALEXIN 500 MG PO CAPS: 500.0000 mg | ORAL_CAPSULE | Freq: Four times a day (QID) | ORAL | 0 refills | Status: AC

## 2021-01-28 NOTE — ED Notes (Signed)
Oxygen level at 96% with ambulation.

## 2021-01-28 NOTE — Telephone Encounter (Signed)
Called home phone ; voicemail to call [facility] back. Next called mobile, LVM to have you go to the [facility] ER now. To call me back to know he got message and will go or not.

## 2021-01-28 NOTE — ED Triage Notes (Signed)
States he was referred her by his PCP for dehydration, states he cannot eat, seen a week ago and diagnosed with Covid

## 2021-01-28 NOTE — Telephone Encounter (Signed)
Advise to increase Lantus to 18 units , test glucose 4 x daily. call back if BG <70 or still >200 x 3.

## 2021-01-28 NOTE — Telephone Encounter (Signed)
I would recommend that he go to the emergency room because I think his COVID-19 disease is still an issue for him, affecting his diabetes and overall wellbeing.  He is not eating much at all which is also very concerning.  Emergency room please, thanks.

## 2021-01-28 NOTE — Telephone Encounter (Signed)
Pt called back to office & said he guess he has no other choice then to go to ER. IN rout now over 10-20 mins ago.

## 2021-01-28 NOTE — Telephone Encounter (Signed)
Left msg with wife for pt to call when he gets home from the hosp. She states that he went to the ER per Dr Alphonzo Grieve.

## 2021-01-28 NOTE — Telephone Encounter (Signed)
Pt states he has had a lack of appetite and has not been eating much, called in with readings.   5/13 AM 199 PM 246  5/14 AM 267 PM did not check  5/15 AM 167 PM did not check   5/16 AM 167 PM 246  5/17 AM 309

## 2021-01-28 NOTE — Discharge Instructions (Addendum)
Please make sure that you are eating and drinking throughout the day.  I would recommend 64 ounces of water daily at a minimum.  I am prescribing you an antibiotic called Keflex.  Please take this 4 times a day for the next 7 days for possible UTI.  Please follow-up with your regular doctor regarding your symptoms.  If they worsen once again, please come back to the emergency department for reevaluation.  It was a pleasure to meet you.

## 2021-01-28 NOTE — ED Provider Notes (Signed)
[facility] EMERGENCY DEPARTMENT Provider Note   CSN: [id number] Arrival date & time: 01/28/21  1116     History No chief complaint on file.   Chase Briggs is a 70 y.o. male.  HPI   Patient is a 70 year old male with a medical history as noted below.  He presents to the emergency department today due to fatigue.  Patient states he was diagnosed with COVID-19, 1 week ago.  He states he has been consistently fatigued and is also having a decreased appetite.  He states he has been drinking normally but notes that "he just cannot eat".  Reports an associated productive cough with clear phlegm, night sweats, as well as elevated blood glucose.  Also complains of intermittent dysuria that started around the onset of his symptoms.  No congestion, sore throat, rhinorrhea, chest pain, shortness of breath, abdominal pain, nausea, vomiting, diarrhea.  States he has been vaccinated for COVID-19 x 2.  He notes that he is a smoker and typically smokes about 1 pack/week.     Past Medical History:  Diagnosis Date  . Diabetes mellitus   . Fatty liver   . GERD (gastroesophageal reflux disease)   . HTN (hypertension)   . Hyperlipidemia   . Insomnia     Patient Active Problem List   Diagnosis Date Noted  . Insomnia 12/12/2019  . Current smoker 08/31/2017  . Mixed hyperlipidemia 08/27/2015  . Vitamin D deficiency 08/27/2015  . Uncontrolled type 2 diabetes mellitus with stage 3 chronic kidney disease, without long-term current use of insulin (HCC) 02/20/2014  . Severe protein-calorie malnutrition (HCC) 02/20/2014  . Fatty liver 09/21/2012  . Weight loss 09/21/2012  . Sternum pain 05/20/2011  . Screen for colon cancer 05/20/2011  . RUQ pain 01/07/2011  . Essential hypertension 01/07/2011    Past Surgical History:  Procedure Laterality Date  . ABDOMINAL AORTOGRAM W/LOWER EXTREMITY N/A 08/22/2018   Procedure: ABDOMINAL AORTOGRAM W/LOWER EXTREMITY;  Surgeon: [name],  MD;  Location: [facility] INVASIVE CV LAB;  Service: Cardiovascular;  Laterality: N/A;  . BACK SURGERY    . CARPAL TUNNEL WITH CUBITAL TUNNEL Right 03/21/2019   Procedure: RIGHT CARPAL TUNNEL AND CUBITAL  TUNNEL RELEASES;  Surgeon: [name], MD;  Location: [facility];  Service: Orthopedics;  Laterality: Right;  AXILLARY BLOCK  . COLONOSCOPY  01/2007   Dr. [name], reviewed report, mild diverticulosis. Prep adequate. Next TCS due 01/2017  . ESOPHAGOGASTRODUODENOSCOPY N/A 10/21/2012   [id number] GASTRITIS  . MASS EXCISION Right 10/26/2012   Procedure: EXCISION MASS;  Surgeon: [name], MD;  Location: [facility];  Service: General;  Laterality: Right;  . PERIPHERAL VASCULAR INTERVENTION  08/22/2018   Procedure: PERIPHERAL VASCULAR INTERVENTION;  Surgeon: [name], MD;  Location: [facility] INVASIVE CV LAB;  Service: Cardiovascular;;  bilateral external iliac stents  . scalp mass  4-5 yrs ago   Dr [name]  . ULNAR NERVE TRANSPOSITION Right 03/21/2019   Procedure: DECOMPRESSION RIGHT ULNAR NERVE;  Surgeon: [name], MD;  Location: [facility];  Service: Orthopedics;  Laterality: Right;       Family History  Problem Relation Age of Onset  . Diabetes Mother   . Diabetes Father   . Diabetes Brother   . Colon cancer Neg Hx   . Liver disease Neg Hx     Social History   Tobacco Use  . Smoking status: Current Every Day Smoker    Packs/day: 1.00    Years: 35.00  Pack years: 35.00    Types: Cigarettes  . Smokeless tobacco: Never Used  Vaping Use  . Vaping Use: Never used  Substance Use Topics  . Alcohol use: No    Comment: hx heavy etoh x 3 yrs, quit 30+ yrs ago  . Drug use: No    Home Medications Prior to Admission medications   Medication Sig Start Date End Date Taking? Authorizing Provider  amLODipine (NORVASC) 10 MG tablet TAKE (1) TABLET BY MOUTH ONCE DAILY. 12/27/20  Yes Gosrani, Nimish C, MD  aspirin EC 81 MG tablet Take 81 mg by mouth daily.    Yes [provider]  atorvastatin (LIPITOR) 10 MG tablet TAKE ONE TABLET BY MOUTH ONCE DAILY. 12/05/20  Yes Gosrani, Nimish C, MD  cephALEXin (KEFLEX) 500 MG capsule Take 1 capsule (500 mg total) by mouth 4 (four) times daily for 7 days. 01/28/21 02/04/21 Yes Placido SouJoldersma, Haydn Hutsell, PA-C  Cholecalciferol (VITAMIN D) 125 MCG (5000 UT) CAPS Take 5,000 Units by mouth daily.    Yes [provider]  cloNIDine (CATAPRES) 0.3 MG tablet TAKE 1 TABLET BY MOUTH ONCE DAILY. 08/20/20  Yes Gosrani, Nimish C, MD  clopidogrel (PLAVIX) 75 MG tablet TAKE (1) TABLET BY MOUTH ONCE DAILY. 12/19/20  Yes Elenore PaddyGray, Sarah E, NP  Ferrous Sulfate (IRON) 325 (65 Fe) MG TABS Take 325 mg by mouth daily.   Yes [provider]  glucose blood test strip 1 each by Other route 2 (two) times daily. Use as instructed bid E11.65 Relion Premier 05/15/19  Yes Nida, Denman GeorgeGebreselassie W, MD  insulin glargine (LANTUS SOLOSTAR) 100 UNIT/ML Solostar Pen Inject 14 Units into the skin at bedtime. Patient taking differently: Inject 14 Units into the skin daily. 12/18/20  Yes Nida, Denman GeorgeGebreselassie W, MD  Insulin Pen Needle (SURE COMFORT PEN NEEDLES) 32G X 4 MM MISC USING ONCE DAILY. 01/09/20  Yes Roma KayserNida, Gebreselassie W, MD  Lancets MISC 1 each by Does not apply route 2 (two) times daily. E11.65 Relion Premier 05/15/19  Yes Nida, Denman GeorgeGebreselassie W, MD  lisinopril-hydrochlorothiazide (ZESTORETIC) 20-12.5 MG tablet Take 1 tablet by mouth daily. 08/23/20  Yes Roma KayserNida, Gebreselassie W, MD  traZODone (DESYREL) 50 MG tablet TAKE ONE TABLET DAILY AT BEDTIME. 12/05/20  Yes Gosrani, Nimish C, MD  SSD 1 % cream Apply topically daily. Patient not taking: No sig reported 05/14/20   [provider]    Allergies    Patient has no known allergies.  Review of Systems   Review of Systems  All other systems reviewed and are negative. Ten systems reviewed and are negative for acute change, except as noted in the HPI.   Physical Exam Updated Vital  Signs BP (!) 144/68   Pulse 61   Temp 98.1 F (36.7 C) (Oral)   Resp (!) 21   SpO2 99%   Physical Exam Vitals and nursing note reviewed.  Constitutional:      General: He is not in acute distress.    Appearance: Normal appearance. He is not ill-appearing, toxic-appearing or diaphoretic.  HENT:     Head: Normocephalic and atraumatic.     Right Ear: External ear normal.     Left Ear: External ear normal.     Nose: Nose normal.     Mouth/Throat:     Pharynx: Oropharynx is clear.  Eyes:     General: No scleral icterus.       Right eye: No discharge.        Left eye: No discharge.  Extraocular Movements: Extraocular movements intact.     Conjunctiva/sclera: Conjunctivae normal.  Cardiovascular:     Rate and Rhythm: Normal rate and regular rhythm.     Pulses: Normal pulses.     Heart sounds: Normal heart sounds. No murmur heard. No friction rub. No gallop.   Pulmonary:     Effort: Pulmonary effort is normal. No respiratory distress.     Breath sounds: No stridor. No wheezing, rhonchi or rales.     Comments: LCTAB. No tachypnea.  No respiratory distress noted.  Patient speaking in clear and complete sentences. Abdominal:     General: Abdomen is flat.     Palpations: Abdomen is soft.     Tenderness: There is no abdominal tenderness. There is left CVA tenderness. There is no right CVA tenderness.     Comments: Abdomen is flat, soft, and nontender.  Musculoskeletal:        General: Normal range of motion.     Cervical back: Normal range of motion and neck supple. No tenderness.  Skin:    General: Skin is warm and dry.  Neurological:     General: No focal deficit present.     Mental Status: He is alert and oriented to person, place, and time.  Psychiatric:        Mood and Affect: Mood normal.        Behavior: Behavior normal.    ED Results / Procedures / Treatments   Labs (all labs ordered are listed, but only abnormal results are displayed) Labs Reviewed   COMPREHENSIVE METABOLIC PANEL - Abnormal; Notable for the following components:      Result Value   Sodium 129 (*)    Chloride 91 (*)    Glucose, Bld 275 (*)    Total Protein 8.2 (*)    Albumin 3.4 (*)    All other components within normal limits  CBC WITH DIFFERENTIAL/PLATELET - Abnormal; Notable for the following components:   RBC 3.88 (*)    Hemoglobin 12.4 (*)    HCT 37.0 (*)    Abs Immature Granulocytes 0.10 (*)    All other components within normal limits  URINALYSIS, ROUTINE W REFLEX MICROSCOPIC - Abnormal; Notable for the following components:   APPearance HAZY (*)    Glucose, UA 50 (*)    Hgb urine dipstick SMALL (*)    Leukocytes,Ua LARGE (*)    WBC, UA >50 (*)    Bacteria, UA RARE (*)    All other components within normal limits  CBG MONITORING, ED - Abnormal; Notable for the following components:   Glucose-Capillary 308 (*)    All other components within normal limits  URINE CULTURE   EKG EKG Interpretation  Date/Time:  Tuesday Jan 28 2021 12:54:36 EDT Ventricular Rate:  70 PR Interval:  139 QRS Duration: 85 QT Interval:  409 QTC Calculation: 442 R Axis:   24 Text Interpretation: Sinus rhythm Multiple premature complexes, vent & supraven No significant change since prior 5/22 Confirmed by Meridee Score [phone]) on 01/28/2021 1:13:22 PM   Radiology DG Chest Port 1 View  Result Date: 01/28/2021 CLINICAL DATA:  COVID positive with shortness of breath. EXAM: PORTABLE CHEST 1 VIEW COMPARISON:  01/22/2021 FINDINGS: Lungs are hyperexpanded. The lungs are clear without focal pneumonia, edema, pneumothorax or pleural effusion. The cardiopericardial silhouette is within normal limits for size. The visualized bony structures of the thorax show no acute abnormality. Telemetry leads overlie the chest. IMPRESSION: No active disease. Electronically Signed   By: Minerva Areola  Molli Posey M.D.   On: 01/28/2021 13:46   CT Renal Stone Study  Result Date: 01/28/2021 CLINICAL DATA:   Left-sided flank pain and dysuria EXAM: CT ABDOMEN AND PELVIS WITHOUT CONTRAST TECHNIQUE: Multidetector CT imaging of the abdomen and pelvis was performed following the standard protocol without IV contrast. COMPARISON:  10/30/2017 FINDINGS: Lower chest: Emphysematous changes are noted. No focal infiltrate or effusion is seen. Hepatobiliary: Gallbladder is within normal limits. Area of focal fatty infiltration is noted along the falciform ligament. No other focal abnormality is noted. Pancreas: Unremarkable. No pancreatic ductal dilatation or surrounding inflammatory changes. Spleen: Normal in size without focal abnormality. Adrenals/Urinary Tract: Adrenal glands are within normal limits. Right kidney is well visualized without renal calculi or obstructive changes. The left kidney demonstrates some increased perinephric stranding. A few tiny left renal stones are noted. No stones are identified. The bladder is incompletely distended. Stomach/Bowel: Scattered diverticular change of the colon is noted. The appendix is well visualized and within normal limits. Small bowel and stomach are unremarkable. Vascular/Lymphatic: Aortic atherosclerosis. No enlarged abdominal or pelvic lymph nodes. Reproductive: Prostate is unremarkable. Other: No abdominal wall hernia or abnormality. No abdominopelvic ascites. Musculoskeletal: Degenerative changes of lumbar spine are noted. IMPRESSION: A few tiny nonobstructing left renal calculi. Mild diverticular change without diverticulitis. Mild fatty infiltration of the liver along the falciform ligament. Aortic Atherosclerosis (ICD10-I70.0) and Emphysema (ICD10-J43.9). Electronically Signed   By: Alcide Clever M.D.   On: 01/28/2021 15:18   Procedures Procedures   Medications Ordered in ED Medications  sodium chloride 0.9 % bolus 1,000 mL (0 mLs Intravenous Stopped 01/28/21 1419)   ED Course  I have reviewed the triage vital signs and the nursing notes.  Pertinent labs & imaging  results that were available during my care of the patient were reviewed by me and considered in my medical decision making (see chart for details).  Clinical Course as of 01/28/21 1545  Tue Jan 28, 2021  1433 Hgb urine dipstick(!): SMALL [LJ]  1433 Leukocytes,Ua(!): LARGE [LJ]  1433 WBC, UA(!): >50 [LJ]  1433 Bacteria, UA(!): RARE [LJ]    Clinical Course User Index [LJ] Placido Sou, PA-C   MDM Rules/Calculators/A&P                          Pt is a 70 y.o. male who presents to the emergency department due to fatigue, weakness, poor p.o. intake secondary to COVID-19.  Labs: CBC with a hemoglobin of 12.4, RBCs of 3.88, hematocrit of 37. CMP with a sodium of 129, chloride of 91, glucose of 275, total protein of 8.2, albumin of 3.4. UA showing glucose of 50, small hemoglobin, large leukocytes, greater than 50 white blood cells, rare bacteria, white blood cell clumps.  Imaging: Chest x-ray is negative. CT renal stone study shows a few tiny nonobstructing left renal calculi.  Mild diverticular change without diverticulitis.  Mild fatty infiltration of the liver along the falciform ligament.  Aortic atherosclerosis.  I, Placido Sou, PA-C, personally reviewed and evaluated these images and lab results as part of my medical decision-making.  Lab work and imaging today is generally reassuring.  He is mildly hyperglycemic but is having no nausea, vomiting, diarrhea, or abdominal pain.  No elevated anion gap.  No ketonuria.  Doubt DKA.   Patient's chest x-ray was negative.  No hypoxia since coming to the emergency department.  Denies any chest pain or shortness of breath.  Ambulated with a pulse ox with  saturations in the high 90s.  Patient's UA is concerning for possible UTI.  Upon further discussion he does note some intermittent dysuria for the past week.  Denies any other symptoms.  I obtained a CT renal stone study due to mild left flank pain but this was reassuring.  Will discharge  on a course of Keflex.  Feel that patient is stable for discharge and he is agreeable.  Recommended PCP follow-up regarding his symptoms.  If they should worsen once again, he knows to come back to the emergency department for reevaluation.  His questions were answered and he was amicable at the time of discharge.  Note: Portions of this report may have been transcribed using voice recognition software. Every effort was made to ensure accuracy; however, inadvertent computerized transcription errors may be present.   Final Clinical Impression(s) / ED Diagnoses Final diagnoses:  Fatigue, unspecified type  COVID-19  Acute cystitis with hematuria   Rx / DC Orders ED Discharge Orders         Ordered    cephALEXin (KEFLEX) 500 MG capsule  4 times daily        01/28/21 1540           Placido Sou, PA-C 01/28/21 1546    Terrilee Files, MD 01/28/21 1819

## 2021-01-31 LAB — URINE CULTURE: Culture: >=100000 — AB

## 2021-02-07 ENCOUNTER — Other Ambulatory Visit: Payer: Self-pay | Admitting: "Endocrinology

## 2021-02-11 DIAGNOSIS — N182 Chronic kidney disease, stage 2 (mild): Secondary | ICD-10-CM | POA: Diagnosis not present

## 2021-02-11 DIAGNOSIS — Z794 Long term (current) use of insulin: Secondary | ICD-10-CM | POA: Diagnosis not present

## 2021-02-11 DIAGNOSIS — E1165 Type 2 diabetes mellitus with hyperglycemia: Secondary | ICD-10-CM | POA: Diagnosis not present

## 2021-02-11 DIAGNOSIS — E1122 Type 2 diabetes mellitus with diabetic chronic kidney disease: Secondary | ICD-10-CM | POA: Diagnosis not present

## 2021-02-12 ENCOUNTER — Other Ambulatory Visit: Payer: Self-pay

## 2021-02-12 ENCOUNTER — Encounter (INDEPENDENT_AMBULATORY_CARE_PROVIDER_SITE_OTHER): Payer: Self-pay | Admitting: Nurse Practitioner

## 2021-02-12 ENCOUNTER — Ambulatory Visit (INDEPENDENT_AMBULATORY_CARE_PROVIDER_SITE_OTHER): Payer: Medicare Other | Admitting: Nurse Practitioner

## 2021-02-12 ENCOUNTER — Ambulatory Visit: Payer: Medicare Other | Admitting: Cardiology

## 2021-02-12 VITALS — BP 152/60 | HR 107 | Temp 97.3°F | Ht 65.0 in | Wt 130.6 lb

## 2021-02-12 DIAGNOSIS — I10 Essential (primary) hypertension: Secondary | ICD-10-CM | POA: Diagnosis not present

## 2021-02-12 DIAGNOSIS — J310 Chronic rhinitis: Secondary | ICD-10-CM

## 2021-02-12 DIAGNOSIS — K5900 Constipation, unspecified: Secondary | ICD-10-CM | POA: Diagnosis not present

## 2021-02-12 LAB — COMPREHENSIVE METABOLIC PANEL
ALT: 11 IU/L (ref 0–44)
AST: 9 IU/L (ref 0–40)
Albumin/Globulin Ratio: 1.2 (ref 1.2–2.2)
Albumin: 4.2 g/dL (ref 3.8–4.8)
Alkaline Phosphatase: 126 IU/L — ABNORMAL HIGH (ref 44–121)
BUN/Creatinine Ratio: 6 — ABNORMAL LOW (ref 10–24)
BUN: 8 mg/dL (ref 8–27)
Bilirubin Total: 0.5 mg/dL (ref 0.0–1.2)
CO2: 22 mmol/L (ref 20–29)
Calcium: 9.8 mg/dL (ref 8.6–10.2)
Chloride: 99 mmol/L (ref 96–106)
Creatinine, Ser: 1.32 mg/dL — ABNORMAL HIGH (ref 0.76–1.27)
Globulin, Total: 3.4 g/dL (ref 1.5–4.5)
Glucose: 161 mg/dL — ABNORMAL HIGH (ref 65–99)
Potassium: 4.6 mmol/L (ref 3.5–5.2)
Sodium: 138 mmol/L (ref 134–144)
Total Protein: 7.6 g/dL (ref 6.0–8.5)
eGFR: 58 mL/min/{1.73_m2} — ABNORMAL LOW (ref >59)

## 2021-02-12 LAB — LIPID PANEL
Chol/HDL Ratio: 2.1 ratio (ref 0.0–5.0)
Cholesterol, Total: 103 mg/dL (ref 100–199)
HDL: 48 mg/dL (ref >39)
LDL Chol Calc (NIH): 44 mg/dL (ref 0–99)
Triglycerides: 42 mg/dL (ref 0–149)
VLDL Cholesterol Cal: 11 mg/dL (ref 5–40)

## 2021-02-12 MED ORDER — FLUTICASONE PROPIONATE 50 MCG/ACT NA SUSP: 1.0000 | Freq: Every day | NASAL | 1 refills | Status: DC

## 2021-02-12 MED ORDER — LISINOPRIL-HYDROCHLOROTHIAZIDE 20-25 MG PO TABS: 1.0000 | ORAL_TABLET | Freq: Every day | ORAL | 1 refills | Status: DC

## 2021-02-12 NOTE — Patient Instructions (Signed)
Senna-S: mild laxative and stool softener as needed

## 2021-02-12 NOTE — Progress Notes (Signed)
Subjective:  Patient ID: Chase Briggs, male    DOB: Aug 08, 1951  Age: 70 y.o. MRN: [id number]  CC:  Chief Complaint  Patient presents with  . Hypertension  . Other    Nose bleed, Constipation      HPI  This patient arrives today for the above.  Hypertension: He continues on amlodipine 10 mg daily, clonidine 0.3 mg daily, and lisinopril-hydrochlorothiazide 20-12.5 mg daily.  He is tolerating his medications well and tells me overall he feels well but sometimes when he stands up too fast he will feel dizzy momentarily but this will pass.  He also is being followed by cardiology.  He reports that he has a blood pressure cuff at home but does not use on a regular basis and recently was given a new one however he tells me some of the parts are missing so its not working.  He does not have any at home blood pressure checks available with him today.  Nosebleeds: He tells me he has been having intermittent postnasal drip drainage that is a mixture of mucus and blood.  He tells me he will sometimes sniff his nose and will taste blood in the back of his throat.  Sometimes he will spit it out and will see the mixture of the mucus and bright red blood.  He denies any constant bleeding episodes.  He tells me this started about 1 month ago but seems to be occurring more frequently.  First it was just here and there like maybe once every other week, however he tells me last night he had multiple incidences where he spit out some bloody mucus.  He denies any headache, pain over his cheeks, fever, night sweats, chest pain, new fatigue, new shortness of breath, difficulty performing ADLs.  As stated above he does sometimes have some dizziness however this is very rare and does not seem to be associated with the bleeding.  He denies a history of seasonal allergies, but does report he does have a history of nosebleeds in the past and has had to use Vaseline in his nares to keep them  moistened.  Constipation: Reports some constipation and he tells me he has a bowel movement every other day to every 2 days.  He tells me the volume of stools pretty small.  He does not feel like he has to strain very much.  He does take iron.  He is wondering what he can do to relieve the constipation.  He does report bloating after meals at times.   Past Medical History:  Diagnosis Date  . Diabetes mellitus   . Fatty liver   . GERD (gastroesophageal reflux disease)   . HTN (hypertension)   . Hyperlipidemia   . Insomnia       Family History  Problem Relation Age of Onset  . Diabetes Mother   . Diabetes Father   . Diabetes Brother   . Colon cancer Neg Hx   . 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 Married for 12 years. Has 2 sons (33 and 21). Lives with wife.   Social History   Tobacco Use  . Smoking status: Current Every Day Smoker    Packs/day: 1.00    Years: 35.00    Pack years: 35.00    Types: Cigarettes  . Smokeless tobacco: Never Used  Substance  Use Topics  . Alcohol use: No    Comment: hx heavy etoh x 3 yrs, quit 30+ yrs ago     Current Meds  Medication Sig  . amLODipine (NORVASC) 10 MG tablet TAKE (1) TABLET BY MOUTH ONCE DAILY.  Marland Kitchen aspirin EC 81 MG tablet Take 81 mg by mouth daily.  Marland Kitchen atorvastatin (LIPITOR) 10 MG tablet TAKE ONE TABLET BY MOUTH ONCE DAILY.  Marland Kitchen Cholecalciferol (VITAMIN D) 125 MCG (5000 UT) CAPS Take 2,000 Units by mouth daily.  . cloNIDine (CATAPRES) 0.3 MG tablet TAKE 1 TABLET BY MOUTH ONCE DAILY.  Marland Kitchen clopidogrel (PLAVIX) 75 MG tablet TAKE (1) TABLET BY MOUTH ONCE DAILY.  Marland Kitchen Ferrous Sulfate (IRON) 325 (65 Fe) MG TABS Take 325 mg by mouth daily.  . fluticasone (FLONASE) 50 MCG/ACT nasal spray Place 1 spray into both nostrils daily.  . insulin glargine (LANTUS SOLOSTAR) 100 UNIT/ML Solostar Pen Inject 14 Units into the skin at  bedtime. (Patient taking differently: Inject 14 Units into the skin daily.)  . lisinopril-hydrochlorothiazide (ZESTORETIC) 20-25 MG tablet Take 1 tablet by mouth daily.  . traZODone (DESYREL) 50 MG tablet TAKE ONE TABLET DAILY AT BEDTIME.  . [DISCONTINUED] lisinopril-hydrochlorothiazide (ZESTORETIC) 20-12.5 MG tablet Take 1 tablet by mouth daily.    ROS:  Review of Systems  Constitutional: Negative for chills, diaphoresis, fever and malaise/fatigue.  HENT: Positive for nosebleeds.   Respiratory: Negative for shortness of breath.   Cardiovascular: Negative for chest pain.  Gastrointestinal: Positive for constipation. Negative for abdominal pain, blood in stool, diarrhea, nausea and vomiting.     Objective:   Today's Vitals: BP (!) 152/60   Pulse (!) 107   Temp (!) 97.3 F (36.3 C)   Ht 5\' 5"  (1.651 m)   Wt 130 lb 10.1 oz (59.3 kg)   SpO2 98%   BMI 21.74 kg/m  Vitals with BMI 02/12/2021 01/28/2021 01/28/2021  Height 5\' 5"  - -  Weight 130 lbs 10 oz - -  BMI 21.74 - -  Systolic 152 144 01/30/2021  Diastolic 60 68 68  Pulse 107 61 61     Physical Exam Vitals reviewed.  Constitutional:      Appearance: Normal appearance.  HENT:     Head: Normocephalic and atraumatic.     Nose: No rhinorrhea.     Right Nostril: No foreign body, epistaxis or occlusion.     Left Nostril: No foreign body or occlusion.     Right Turbinates: Not enlarged or swollen.     Left Turbinates: Enlarged and swollen.     Right Sinus: No maxillary sinus tenderness or frontal sinus tenderness.     Left Sinus: No maxillary sinus tenderness or frontal sinus tenderness.  Cardiovascular:     Rate and Rhythm: Normal rate. Rhythm irregular.     Heart sounds: Normal heart sounds.  Pulmonary:     Effort: Pulmonary effort is normal.     Breath sounds: Normal breath sounds.  Musculoskeletal:     Cervical back: Neck supple.  Lymphadenopathy:     Cervical: No cervical adenopathy.  Skin:    General: Skin is warm and  dry.  Neurological:     Mental Status: He is alert and oriented to person, place, and time.  Psychiatric:        Mood and Affect: Mood normal.        Behavior: Behavior normal.        Thought Content: Thought content normal.        Judgment:  Judgment normal.          Assessment and Plan   1. Rhinitis, unspecified type   2. Essential hypertension   3. Constipation, unspecified constipation type      Plan: 1.  Left nare is slightly swollen but does not appear to have a lot of drainage present.  Also I do see a scab in the left nare.  We will treat with Flonase nasal spray to see if this helps the swelling.  We also discussed using saline nasal spray to see if it will help moisten the area but for now he would like to just try 1 nasal spray so we will try Flonase.  I did tell him if he feels that the nosebleeds are happening more frequently or the volume of blood increase that he should stop the Flonase and we may consider a nasal saline spray.  Despite trying both of the sprays if improvement is not made that he will have to go see an ear nose and throat doctor.  We also discussed that if he starts to experience bleeding in the back of his throat that does not seem to stop and is prolonged for more than an hour he needs to go to the hospital.  He tells me he understands. 2.  Blood pressure slightly elevated and it has been elevated the last few times we have checked in the office.  Unfortunately he does not have any at home blood pressure records for me to look at.  Based on his nosebleeds and history of elevated blood pressure and in a slightly increase his lisinopril hydrochlorothiazide dose and have him return to office in about 2 weeks to monitor blood work. 3.  I recommend he first increase his water intake to half a gallon of water or so a day.  We also discussed increasing fiber intake through his diet.  We discussed if making these changes does not improve his constipation he could  try over-the-counter Metamucil to increase fiber intake.  We also discussed trying senna S if Metamucil does not help him.  If despite all of these adjustments to his lifestyle and addition of the over-the-counter medications still do not help with his constipation we may need to refer him to GI.  He tells me he understands.   Tests ordered No orders of the defined types were placed in this encounter.     Meds ordered this encounter  Medications  . fluticasone (FLONASE) 50 MCG/ACT nasal spray    Sig: Place 1 spray into both nostrils daily.    Dispense:  16 g    Refill:  1    Order Specific Question:   Supervising Provider    Answer:   Karilyn Cota, NIMISH C [[id number]]  . lisinopril-hydrochlorothiazide (ZESTORETIC) 20-25 MG tablet    Sig: Take 1 tablet by mouth daily.    Dispense:  30 tablet    Refill:  1    Order Specific Question:   Supervising Provider    Answer:   [name] [[id number]]    Patient to follow-up in 2 weeks for close monitoring of blood pressure and metabolic panel, or sooner as needed.  Elenore Paddy, NP

## 2021-02-21 ENCOUNTER — Ambulatory Visit (INDEPENDENT_AMBULATORY_CARE_PROVIDER_SITE_OTHER): Payer: Medicare Other | Admitting: "Endocrinology

## 2021-02-21 ENCOUNTER — Other Ambulatory Visit: Payer: Self-pay

## 2021-02-21 ENCOUNTER — Encounter: Payer: Self-pay | Admitting: "Endocrinology

## 2021-02-21 ENCOUNTER — Other Ambulatory Visit (INDEPENDENT_AMBULATORY_CARE_PROVIDER_SITE_OTHER): Payer: Self-pay | Admitting: Internal Medicine

## 2021-02-21 VITALS — BP 126/58 | HR 56 | Ht 65.0 in | Wt 133.0 lb

## 2021-02-21 DIAGNOSIS — I10 Essential (primary) hypertension: Secondary | ICD-10-CM | POA: Diagnosis not present

## 2021-02-21 DIAGNOSIS — IMO0002 Reserved for concepts with insufficient information to code with codable children: Secondary | ICD-10-CM

## 2021-02-21 DIAGNOSIS — F17200 Nicotine dependence, unspecified, uncomplicated: Secondary | ICD-10-CM | POA: Diagnosis not present

## 2021-02-21 DIAGNOSIS — E782 Mixed hyperlipidemia: Secondary | ICD-10-CM

## 2021-02-21 DIAGNOSIS — N182 Chronic kidney disease, stage 2 (mild): Secondary | ICD-10-CM

## 2021-02-21 DIAGNOSIS — E1165 Type 2 diabetes mellitus with hyperglycemia: Secondary | ICD-10-CM

## 2021-02-21 DIAGNOSIS — Z794 Long term (current) use of insulin: Secondary | ICD-10-CM

## 2021-02-21 DIAGNOSIS — E1122 Type 2 diabetes mellitus with diabetic chronic kidney disease: Secondary | ICD-10-CM

## 2021-02-21 LAB — POCT GLYCOSYLATED HEMOGLOBIN (HGB A1C): HbA1c, POC (controlled diabetic range): 6.5 % (ref 0.0–7.0)

## 2021-02-21 MED ORDER — LANTUS SOLOSTAR 100 UNIT/ML ~~LOC~~ SOPN: 14.0000 [IU] | PEN_INJECTOR | Freq: Every day | SUBCUTANEOUS | 0 refills | Status: DC

## 2021-02-21 NOTE — Progress Notes (Signed)
02/21/2021   Endocrinology follow-up note    Subjective:    Patient ID: Chase Briggs, male    DOB: [birth year], PCP [name], MD   Past Medical History:  Diagnosis Date   Diabetes mellitus    Fatty liver    GERD (gastroesophageal reflux disease)    HTN (hypertension)    Hyperlipidemia    Insomnia    Past Surgical History:  Procedure Laterality Date   ABDOMINAL AORTOGRAM W/LOWER EXTREMITY N/A 08/22/2018   Procedure: ABDOMINAL AORTOGRAM W/LOWER EXTREMITY;  Surgeon: [name], MD;  Location: [facility] INVASIVE CV LAB;  Service: Cardiovascular;  Laterality: N/A;   BACK SURGERY     CARPAL TUNNEL WITH CUBITAL TUNNEL Right 03/21/2019   Procedure: RIGHT CARPAL TUNNEL AND CUBITAL  TUNNEL RELEASES;  Surgeon: [name], MD;  Location: [facility];  Service: Orthopedics;  Laterality: Right;  AXILLARY BLOCK   COLONOSCOPY  01/2007   Dr. [name], reviewed report, mild diverticulosis. Prep adequate. Next TCS due 01/2017   ESOPHAGOGASTRODUODENOSCOPY N/A 10/21/2012   IRC:VELFYBOF GASTRITIS   MASS EXCISION Right 10/26/2012   Procedure: EXCISION MASS;  Surgeon: [name], MD;  Location: [facility];  Service: General;  Laterality: Right;   PERIPHERAL VASCULAR INTERVENTION  08/22/2018   Procedure: PERIPHERAL VASCULAR INTERVENTION;  Surgeon: [name], MD;  Location: [facility] INVASIVE CV LAB;  Service: Cardiovascular;;  bilateral external iliac stents   scalp mass  4-5 yrs ago   Dr Angelique Holm NERVE TRANSPOSITION Right 03/21/2019   Procedure: DECOMPRESSION RIGHT ULNAR NERVE;  Surgeon: [name], MD;  Location: [facility];  Service: Orthopedics;  Laterality: Right;   Social History   Socioeconomic History   Marital status: Married    Spouse name: Not on file   Number of children: 2   Years of education: Not on file   Highest education level: Not on file  Occupational History   Occupation: reitred    Comment: [employer], heavy  lifting  Tobacco Use   Smoking status: Every Day    Packs/day: 1.00    Years: 35.00    Pack years: 35.00    Types: Cigarettes   Smokeless tobacco: Never  Vaping Use   Vaping Use: Never used  Substance and Sexual Activity   Alcohol use: No    Comment: hx heavy etoh x 3 yrs, quit 30+ yrs ago   Drug use: No   Sexual activity: Yes    Birth control/protection: None  Other Topics Concern   Not on file  Social History Narrative   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 Highest level of education: 12th grade. Married for 12 years. Has 2 sons (33 and 21). Lives with wife.   Social Determinants of Health   Financial Resource Strain: Not on file  Food Insecurity: Not on file  Transportation Needs: Not on file  Physical Activity: Not on file  Stress: Not on file  Social Connections: Not on file   Outpatient Encounter Medications as of 02/21/2021  Medication Sig   amLODipine (NORVASC) 10 MG tablet TAKE (1) TABLET BY MOUTH ONCE DAILY.   aspirin EC 81 MG tablet Take 81 mg by mouth daily.   atorvastatin (LIPITOR) 10 MG tablet TAKE ONE TABLET BY MOUTH ONCE DAILY.   Cholecalciferol (VITAMIN D) 125 MCG (5000 UT) CAPS Take 2,000 Units by mouth daily.   cloNIDine (CATAPRES) 0.3 MG tablet TAKE 1 TABLET BY MOUTH ONCE DAILY.   clopidogrel (PLAVIX) 75 MG  tablet TAKE (1) TABLET BY MOUTH ONCE DAILY.   Ferrous Sulfate (IRON) 325 (65 Fe) MG TABS Take 325 mg by mouth daily.   fluticasone (FLONASE) 50 MCG/ACT nasal spray Place 1 spray into both nostrils daily.   glucose blood test strip 1 each by Other route 2 (two) times daily. Use as instructed bid E11.65 Relion Premier   insulin glargine (LANTUS SOLOSTAR) 100 UNIT/ML Solostar Pen Inject 14 Units into the skin at bedtime.   Insulin Pen Needle (SURE COMFORT PEN NEEDLES) 32G X 4 MM MISC USE ONCE DAILY   Lancets MISC 1 each by Does not apply route 2 (two) times daily. E11.65 Relion Premier   lisinopril-hydrochlorothiazide (ZESTORETIC) 20-25 MG tablet Take  1 tablet by mouth daily.   SSD 1 % cream Apply topically daily. (Patient not taking: No sig reported)   traZODone (DESYREL) 50 MG tablet TAKE ONE TABLET DAILY AT BEDTIME.   [DISCONTINUED] insulin glargine (LANTUS SOLOSTAR) 100 UNIT/ML Solostar Pen Inject 14 Units into the skin at bedtime. (Patient taking differently: Inject 14 Units into the skin daily.)   No facility-administered encounter medications on file as of 02/21/2021.   ALLERGIES: No Known Allergies VACCINATION STATUS: Immunization History  Administered Date(s) Administered   Moderna Sars-Covid-2 Vaccination 01/04/2020, 02/06/2020    Diabetes He presents for his follow-up diabetic visit. He has type 2 diabetes mellitus. Onset time: He was diagnosed at approximate age of 35 years. His disease course has been improving. There are no hypoglycemic associated symptoms. Pertinent negatives for hypoglycemia include no confusion, headaches, pallor or seizures. Pertinent negatives for diabetes include no chest pain, no fatigue, no polydipsia, no polyphagia, no polyuria and no weakness. There are no hypoglycemic complications. Symptoms are improving (He recently lost control of his glycemia due to exposure to heavy dose steroids.). Risk factors for coronary artery disease include diabetes mellitus, dyslipidemia, hypertension, male sex, sedentary lifestyle and tobacco exposure. He is compliant with treatment most of the time. His weight is stable. He is following a generally unhealthy diet. When asked about meal planning, he reported none. He has not had a previous visit with a dietitian. His home blood glucose trend is decreasing steadily. His breakfast blood glucose range is generally 110-130 mg/dl. His bedtime blood glucose range is generally 140-180 mg/dl. His overall blood glucose range is 140-180 mg/dl. (He presents with controlled glycemic profile both fasting and postprandial.  His point-of-care A1c is 6.5%.) An ACE inhibitor/angiotensin II  receptor blocker is being taken. Eye exam is current.  Hypertension This is a chronic problem. The current episode started more than 1 year ago. The problem has been gradually improving since onset. The problem is uncontrolled. Pertinent negatives include no chest pain, headaches, neck pain, palpitations or shortness of breath. Risk factors for coronary artery disease include smoking/tobacco exposure, sedentary lifestyle, dyslipidemia, family history and diabetes mellitus. Past treatments include ACE inhibitors, calcium channel blockers and central alpha agonists. The current treatment provides no improvement. Compliance problems include psychosocial issues.  Hypertensive end-organ damage includes kidney disease. Identifiable causes of hypertension include chronic renal disease.  Hyperlipidemia This is a chronic problem. The current episode started more than 1 year ago. Exacerbating diseases include chronic renal disease and diabetes. Pertinent negatives include no chest pain, myalgias or shortness of breath. Current antihyperlipidemic treatment includes statins. Compliance problems include psychosocial issues.  Risk factors for coronary artery disease include dyslipidemia, diabetes mellitus, male sex, hypertension and a sedentary lifestyle.    Review of Systems  Constitutional:  Negative for  fatigue and unexpected weight change.  HENT:  Negative for dental problem, mouth sores and trouble swallowing.   Eyes:  Negative for visual disturbance.  Respiratory:  Negative for cough, choking, chest tightness, shortness of breath and wheezing.   Cardiovascular:  Negative for chest pain, palpitations and leg swelling.  Gastrointestinal:  Negative for abdominal distention, abdominal pain, constipation, diarrhea, nausea and vomiting.  Endocrine: Negative for polydipsia, polyphagia and polyuria.  Genitourinary:  Negative for dysuria, flank pain, hematuria and urgency.  Musculoskeletal:  Negative for back pain,  gait problem, myalgias and neck pain.  Skin:  Negative for pallor, rash and wound.  Neurological:  Negative for seizures, syncope, weakness, numbness and headaches.  Psychiatric/Behavioral: Negative.  Negative for confusion and dysphoric mood.    Objective:    BP (!) 126/58   Pulse (!) 56   Ht 5\' 5"  (1.651 m)   Wt 133 lb (60.3 kg)   BMI 22.13 kg/m   Wt Readings from Last 3 Encounters:  02/21/21 133 lb (60.3 kg)  02/12/21 130 lb 10.1 oz (59.3 kg)  01/21/21 132 lb 4.4 oz (60 kg)     Physical Exam- Limited  Constitutional:  Body mass index is 22.13 kg/m. , not in acute distress, normal state of mind  Reluctant affect, not ready to quit smoking  Complete Blood Count (Most recent): Lab Results  Component Value Date   WBC 7.8 01/28/2021   HGB 12.4 (L) 01/28/2021   HCT 37.0 (L) 01/28/2021   MCV 95.4 01/28/2021   PLT 386 01/28/2021   Chemistry (most recent): Lab Results  Component Value Date   NA 138 02/11/2021   K 4.6 02/11/2021   CL 99 02/11/2021   CO2 22 02/11/2021   BUN 8 02/11/2021   CREATININE 1.32 (H) 02/11/2021   Diabetic Labs (most recent): Lab Results  Component Value Date   HGBA1C 6.5 02/21/2021   HGBA1C 7.5 (A) 08/23/2020   HGBA1C 6.8 (A) 04/23/2020   Lipid Panel     Component Value Date/Time   CHOL 103 02/11/2021 0808   TRIG 42 02/11/2021 0808   HDL 48 02/11/2021 0808   CHOLHDL 2.1 02/11/2021 0808   CHOLHDL 2.6 12/12/2019 0905   LDLCALC 44 02/11/2021 0808   LDLCALC 56 12/12/2019 0905     Assessment & Plan:   1. Type 2 diabetes mellitus with microalbuminuria , with long-term current use of insulin (HCC) He presents with controlled glycemic profile both fasting and postprandial.  His point-of-care A1c is 6.5%.  He does not have any hypoglycemic episodes.  He is a chronic heavy smoker, remains at a high risk for more acute and chronic complications of diabetes which include CAD, CVA, CKD, retinopathy, and neuropathy. These are all discussed in  detail with the patient.    Recent labs reviewed, stage 3 renal insufficiency. - I have re-counseled the patient on diet management  by adopting a carbohydrate restricted / protein rich  Diet.  - he acknowledges that there is a room for improvement in his food and drink choices. - Suggestion is made for him to avoid simple carbohydrates  from his diet including Cakes, Sweet Desserts, Ice Cream, Soda (diet and regular), Sweet Tea, Candies, Chips, Cookies, Store Bought Juices, Alcohol in Excess of  1-2 drinks a day, Artificial Sweeteners,  Coffee Creamer, and "Sugar-free" Products, Lemonade. This will help patient to have more stable blood glucose profile and potentially avoid unintended weight gain.  - Patient is advised to stick to a routine mealtimes to  eat 3 meals  a day and avoid unnecessary snacks ( to snack only to correct hypoglycemia).  - I have approached patient with the following individualized plan to manage diabetes and patient agrees.  - He has benefited from insulin initiation.   - Based on  his presentation with target glycemic profile, he is advised to continue Lantus 14 units daily with breakfast.    -He is encouraged to continue monitoring blood glucose at least twice a day-daily before breakfast and at bedtime.    - He will call clinic if he registers blood glucose readings less than 70 or greater than 200 mg/dL.  -   He has stage 3 renal insufficiency which has improved over time, will stay off of metformin for now.   If his renal insufficiency continues, he will need consultation with nephrology.    - Patient is not a candidate for  incretin therapy, nor SGLT2 inhibitors. - Patient specific target  for A1c; LDL, HDL, Triglycerides, were discussed in detail.  2) BP/HTN:  -His blood pressure is controlled to target.        He has 3 medications including losartan, clonidine, and amlodipine.  He continues to smoke heavily.  He did not take his blood pressure medications  this morning.  He has responded to the addition of clonidine. The patient was counseled on the dangers of tobacco use, and was advised to quit.  Reviewed strategies to maximize success, including removing cigarettes and smoking materials from environment.   3) Lipids/HPL: His most recent lipid panel showed LDL controlled at 56.  He is not on statins for now.    4)  Weight/Diet: His BMI is 22-he is not a candidate for weight loss.   He has maintained his gain of 20 pounds of weight.   - He would benefit from addition of more complex carbohydrates, information provided. CDE consult is requested.  5. Vitamin D deficiency  - He he is on ongoing supplement with vitamin D3 5000 units daily.    6) Chronic Care/Health Maintenance:  -Patient is on ACEI/ARB and Statin medications and encouraged to continue to follow up with Ophthalmology, Podiatrist at least yearly or according to recommendations, and advised to  Quit  smoking. I have recommended yearly flu vaccine and pneumonia vaccination at least every 5 years; moderate intensity exercise for up to 150 minutes weekly; and  sleep for at least 7 hours a day. -He is counseled extensively for smoking cessation.  - I advised patient to maintain close follow up with [name], MD for primary care needs. - I have advised him to avoid over-the-counter pain medications to avoid additional risk for renal insufficiency.    I spent 31 minutes in the care of the patient today including review of labs from CMP, Lipids, Thyroid Function, Hematology (current and previous including abstractions from other facilities); face-to-face time discussing  his blood glucose readings/logs, discussing hypoglycemia and hyperglycemia episodes and symptoms, medications doses, his options of short and long term treatment based on the latest standards of care / guidelines;  discussion about incorporating lifestyle medicine;  and documenting the encounter.    Please refer  to Patient Instructions for Blood Glucose Monitoring and Insulin/Medications Dosing Guide"  in media tab for additional information. Please  also refer to " Patient Self Inventory" in the Media  tab for reviewed elements of pertinent patient history.  Roseanna Rainbow Panagopoulos participated in the discussions, expressed understanding, and voiced agreement with the above plans.  All  questions were answered to his satisfaction. he is encouraged to contact clinic should he have any questions or concerns prior to his return visit.   Follow up plan: -Return in about 6 months (around 08/23/2021) for F/U with Pre-visit Labs, Meter, Logs, A1c here.Marquis Lunch, MD Phone: [phone]  Fax: [phone]  This note was partially dictated with voice recognition software. Similar sounding words can be transcribed inadequately or may not  be corrected upon review.  02/21/2021, 10:58 AM

## 2021-02-24 ENCOUNTER — Other Ambulatory Visit (INDEPENDENT_AMBULATORY_CARE_PROVIDER_SITE_OTHER): Payer: Self-pay | Admitting: Internal Medicine

## 2021-02-27 ENCOUNTER — Ambulatory Visit (INDEPENDENT_AMBULATORY_CARE_PROVIDER_SITE_OTHER): Payer: Medicare Other | Admitting: Nurse Practitioner

## 2021-02-27 ENCOUNTER — Encounter (INDEPENDENT_AMBULATORY_CARE_PROVIDER_SITE_OTHER): Payer: Self-pay | Admitting: Nurse Practitioner

## 2021-02-27 ENCOUNTER — Other Ambulatory Visit: Payer: Self-pay

## 2021-02-27 VITALS — BP 110/74 | HR 58 | Temp 97.2°F | Ht 65.0 in | Wt 133.4 lb

## 2021-02-27 DIAGNOSIS — J310 Chronic rhinitis: Secondary | ICD-10-CM | POA: Diagnosis not present

## 2021-02-27 DIAGNOSIS — I10 Essential (primary) hypertension: Secondary | ICD-10-CM

## 2021-02-27 NOTE — Progress Notes (Signed)
Subjective:  Patient ID: Chase Briggs, male    DOB: 1950-11-21  Age: 70 y.o. MRN: [id number]  CC:  Chief Complaint  Patient presents with   Follow-up    Doing okay, not able to tell much difference, still getting light headed from sitting to standing position   Hypertension   Other    Nosebleeds      HPI  This patient arrives today for the above.  Hypertension: We increased his lisinopril-hydrochlorothiazide from 20/12.5 to 20/25 at his last office visit.  Since making this change he tells me he has been feeling okay.  He will periodically feel a bit dizzy with position changes but this does not occur every time he changes position.  He is scheduled to follow-up with a cardiologist in August for assistance with evaluating his concerns.  He denies any chest pain or shortness of breath.  He also continues on his amlodipine and clonidine.  Nosebleeds: At his last office visit he has been complaining of frequent nosebleeds.  He had trialed Flonase nasal spray at that time, and he tells me since using the nasal spray he has not experienced any additional nosebleeds.  He also feels that the area in his nose that was tender earlier has now healed.  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 Married for 12 years. Has 2 sons (60 and 73). Lives with wife.   Social History   Tobacco Use   Smoking status: Every Day    Packs/day: 1.00    Years: 35.00    Pack years: 35.00    Types: Cigarettes   Smokeless tobacco: Never  Substance Use Topics   Alcohol use:  No    Comment: hx heavy etoh x 3 yrs, quit 30+ yrs ago     Current Meds  Medication Sig   amLODipine (NORVASC) 10 MG tablet TAKE (1) TABLET BY MOUTH ONCE DAILY.   aspirin EC 81 MG tablet Take 81 mg by mouth daily.   atorvastatin (LIPITOR) 10 MG tablet TAKE ONE TABLET BY MOUTH ONCE DAILY.   cephALEXin (KEFLEX) 500 MG capsule    Cholecalciferol (VITAMIN D) 125 MCG (5000 UT) CAPS Take 2,000 Units by mouth daily.   cloNIDine (CATAPRES) 0.3 MG tablet TAKE 1 TABLET BY MOUTH ONCE DAILY.   clopidogrel (PLAVIX) 75 MG tablet TAKE (1) TABLET BY MOUTH ONCE DAILY.   Ferrous Sulfate (IRON) 325 (65 Fe) MG TABS Take 325 mg by mouth daily.   fluticasone (FLONASE) 50 MCG/ACT nasal spray Place 1 spray into both nostrils daily.   glucose blood test strip 1 each by Other route 2 (two) times daily. Use as instructed bid E11.65 Relion Premier   insulin glargine (LANTUS SOLOSTAR) 100 UNIT/ML Solostar Pen Inject 14 Units into the skin at bedtime.   Insulin Pen Needle (SURE COMFORT PEN NEEDLES) 32G X 4 MM MISC USE ONCE DAILY   Lancets MISC 1 each by Does not  apply route 2 (two) times daily. E11.65 Relion Premier   lisinopril-hydrochlorothiazide (ZESTORETIC) 20-25 MG tablet Take 1 tablet by mouth daily.   traZODone (DESYREL) 50 MG tablet TAKE ONE TABLET DAILY AT BEDTIME.    ROS:  Review of Systems  HENT:  Negative for nosebleeds.   Respiratory:  Negative for shortness of breath.   Cardiovascular:  Negative for chest pain.  Neurological:  Positive for dizziness (intermittently). Negative for headaches.    Objective:   Today's Vitals: BP 110/74   Pulse (!) 58   Temp (!) 97.2 F (36.2 C) (Temporal)   Ht $R'5\' 5"'ZG$  (1.651 m)   Wt 133 lb 6.4 oz (60.5 kg)   SpO2 99%   BMI 22.20 kg/m  Vitals with BMI 02/27/2021 02/21/2021 02/12/2021  Height $Remov'5\' 5"'XVOKLD$  $Remove'5\' 5"'zTTHThD$  $RemoveB'5\' 5"'vtZJFasq$   Weight 133 lbs 6 oz 133 lbs 130 lbs 10 oz  BMI 22.2 92.01 00.71  Systolic 219 758 832  Diastolic 74 58 60  Pulse 58 56 107     Physical Exam Vitals  reviewed.  Constitutional:      Appearance: Normal appearance.  HENT:     Head: Normocephalic and atraumatic.  Cardiovascular:     Rate and Rhythm: Normal rate and regular rhythm.  Pulmonary:     Effort: Pulmonary effort is normal.     Breath sounds: Normal breath sounds.  Musculoskeletal:     Cervical back: Neck supple.  Skin:    General: Skin is warm and dry.  Neurological:     Mental Status: He is alert and oriented to person, place, and time.  Psychiatric:        Mood and Affect: Mood normal.        Behavior: Behavior normal.        Thought Content: Thought content normal.        Judgment: Judgment normal.         Assessment and Plan   1. Essential hypertension   2. Rhinitis, unspecified type      Plan: 1.  Blood pressure very well controlled on current medication regimen.  We will check metabolic panel for further evaluation and monitoring of his kidney function and electrolytes.  He will continue on his antihypertensives as currently prescribed. 2.  This seems to have resolved at this time.  No further work-up recommended at this time.   Tests ordered Orders Placed This Encounter  Procedures   CMP with eGFR([facility])      No orders of the defined types were placed in this encounter.   Patient to follow-up in 3 months or sooner as needed.  Ailene Ards, NP

## 2021-02-28 ENCOUNTER — Other Ambulatory Visit (INDEPENDENT_AMBULATORY_CARE_PROVIDER_SITE_OTHER): Payer: Self-pay | Admitting: Nurse Practitioner

## 2021-02-28 DIAGNOSIS — E871 Hypo-osmolality and hyponatremia: Secondary | ICD-10-CM

## 2021-02-28 LAB — COMPLETE METABOLIC PANEL WITH GFR
AG Ratio: 1.4 (calc) (ref 1.0–2.5)
ALT: 9 U/L (ref 9–46)
AST: 14 U/L (ref 10–35)
Albumin: 4.2 g/dL (ref 3.6–5.1)
Alkaline phosphatase (APISO): 95 U/L (ref 35–144)
BUN/Creatinine Ratio: 10 (calc) (ref 6–22)
BUN: 13 mg/dL (ref 7–25)
CO2: 28 mmol/L (ref 20–32)
Calcium: 10 mg/dL (ref 8.6–10.3)
Chloride: 96 mmol/L — ABNORMAL LOW (ref 98–110)
Creat: 1.26 mg/dL — ABNORMAL HIGH (ref 0.70–1.25)
GFR, Est African American: 67 mL/min/{1.73_m2} (ref >=60)
GFR, Est Non African American: 58 mL/min/{1.73_m2} — ABNORMAL LOW (ref >=60)
Globulin: 3.1 g/dL (calc) (ref 1.9–3.7)
Glucose, Bld: 206 mg/dL — ABNORMAL HIGH (ref 65–139)
Potassium: 4.5 mmol/L (ref 3.5–5.3)
Sodium: 132 mmol/L — ABNORMAL LOW (ref 135–146)
Total Bilirubin: 0.4 mg/dL (ref 0.2–1.2)
Total Protein: 7.3 g/dL (ref 6.1–8.1)

## 2021-03-07 ENCOUNTER — Other Ambulatory Visit (INDEPENDENT_AMBULATORY_CARE_PROVIDER_SITE_OTHER): Payer: Self-pay | Admitting: Nurse Practitioner

## 2021-03-07 ENCOUNTER — Other Ambulatory Visit (INDEPENDENT_AMBULATORY_CARE_PROVIDER_SITE_OTHER): Payer: Self-pay | Admitting: Internal Medicine

## 2021-03-07 DIAGNOSIS — I739 Peripheral vascular disease, unspecified: Secondary | ICD-10-CM

## 2021-03-07 DIAGNOSIS — I10 Essential (primary) hypertension: Secondary | ICD-10-CM

## 2021-03-07 DIAGNOSIS — G4700 Insomnia, unspecified: Secondary | ICD-10-CM

## 2021-03-07 NOTE — Telephone Encounter (Signed)
Refill

## 2021-03-18 ENCOUNTER — Other Ambulatory Visit: Payer: Self-pay | Admitting: "Endocrinology

## 2021-03-19 ENCOUNTER — Other Ambulatory Visit: Payer: Self-pay

## 2021-03-19 ENCOUNTER — Other Ambulatory Visit (INDEPENDENT_AMBULATORY_CARE_PROVIDER_SITE_OTHER): Payer: Medicare Other

## 2021-03-19 DIAGNOSIS — E871 Hypo-osmolality and hyponatremia: Secondary | ICD-10-CM | POA: Diagnosis not present

## 2021-03-20 LAB — BASIC METABOLIC PANEL WITH GFR
BUN: 15 mg/dL (ref 7–25)
CO2: 26 mmol/L (ref 20–32)
Calcium: 9.8 mg/dL (ref 8.6–10.3)
Chloride: 100 mmol/L (ref 98–110)
Creat: 1.25 mg/dL (ref 0.70–1.25)
GFR, Est African American: 68 mL/min/{1.73_m2} (ref >=60)
GFR, Est Non African American: 58 mL/min/{1.73_m2} — ABNORMAL LOW (ref >=60)
Glucose, Bld: 137 mg/dL — ABNORMAL HIGH (ref 65–99)
Potassium: 4.4 mmol/L (ref 3.5–5.3)
Sodium: 135 mmol/L (ref 135–146)

## 2021-04-02 ENCOUNTER — Telehealth (INDEPENDENT_AMBULATORY_CARE_PROVIDER_SITE_OTHER): Payer: Self-pay | Admitting: Internal Medicine

## 2021-04-02 ENCOUNTER — Other Ambulatory Visit (INDEPENDENT_AMBULATORY_CARE_PROVIDER_SITE_OTHER): Payer: Self-pay | Admitting: Internal Medicine

## 2021-04-02 DIAGNOSIS — I10 Essential (primary) hypertension: Secondary | ICD-10-CM

## 2021-04-02 MED ORDER — AMLODIPINE BESYLATE 10 MG PO TABS: 10.0000 mg | ORAL_TABLET | Freq: Every day | ORAL | 0 refills | Status: DC

## 2021-04-02 MED ORDER — LISINOPRIL-HYDROCHLOROTHIAZIDE 20-25 MG PO TABS: 1.0000 | ORAL_TABLET | Freq: Every day | ORAL | 0 refills | Status: DC

## 2021-04-03 NOTE — Telephone Encounter (Signed)
Done

## 2021-04-16 ENCOUNTER — Other Ambulatory Visit (INDEPENDENT_AMBULATORY_CARE_PROVIDER_SITE_OTHER): Payer: Self-pay | Admitting: Internal Medicine

## 2021-04-17 ENCOUNTER — Ambulatory Visit (INDEPENDENT_AMBULATORY_CARE_PROVIDER_SITE_OTHER): Payer: Medicare Other | Admitting: Cardiology

## 2021-04-17 ENCOUNTER — Encounter: Payer: Self-pay | Admitting: Cardiology

## 2021-04-17 VITALS — BP 134/64 | HR 74 | Ht 67.0 in | Wt 130.0 lb

## 2021-04-17 DIAGNOSIS — I2510 Atherosclerotic heart disease of native coronary artery without angina pectoris: Secondary | ICD-10-CM

## 2021-04-17 NOTE — Progress Notes (Signed)
Clinical Summary Chase Briggs is a 70 y.o.male seen today for a focused visit for prior episodes of chest pain.   1. Chest pain - occasional left sided pain. Dull pain, 1/10 in severity. Sporadic in frequency, usually with upper body activity. Better with rubbing area. Lasts a few minutes. - walks daily x 20 -30 minutes, no exertional symptoms   CAD risk factors: HTN, HL, DM2, PAD, tobacco    - no recent chest pains - no SOB/DOE - remains very phsyically active, no exertional       2. PAD - followed by vascular - prir tesnt to right external iliac, left external iliac - he is on a statin atorva 10mg  daily, LDL was 56 at last check.      Past Medical History:  Diagnosis Date   Diabetes mellitus    Fatty liver    GERD (gastroesophageal reflux disease)    HTN (hypertension)    Hyperlipidemia    Insomnia      No Known Allergies   Current Outpatient Medications  Medication Sig Dispense Refill   amLODipine (NORVASC) 10 MG tablet Take 1 tablet (10 mg total) by mouth daily. 90 tablet 0   aspirin EC 81 MG tablet Take 81 mg by mouth daily.     atorvastatin (LIPITOR) 10 MG tablet TAKE ONE TABLET BY MOUTH ONCE DAILY. 90 tablet 0   cephALEXin (KEFLEX) 500 MG capsule      Cholecalciferol (VITAMIN D) 125 MCG (5000 UT) CAPS Take 2,000 Units by mouth daily.     cloNIDine (CATAPRES) 0.3 MG tablet TAKE 1 TABLET BY MOUTH ONCE DAILY. 90 tablet 0   clopidogrel (PLAVIX) 75 MG tablet TAKE (1) TABLET BY MOUTH ONCE DAILY. 90 tablet 0   Ferrous Sulfate (IRON) 325 (65 Fe) MG TABS Take 325 mg by mouth daily.     fluticasone (FLONASE) 50 MCG/ACT nasal spray Place 1 spray into both nostrils daily. 16 g 1   glucose blood test strip 1 each by Other route 2 (two) times daily. Use as instructed bid E11.65 Relion Premier 100 each 5   Insulin Pen Needle (SURE COMFORT PEN NEEDLES) 32G X 4 MM MISC USE ONCE DAILY 100 each 2   Lancets MISC 1 each by Does not apply route 2 (two) times daily.  E11.65 Relion Premier 100 each 5   LANTUS SOLOSTAR 100 UNIT/ML Solostar Pen INJECT 14 UNITS UNDER THE SKIN AT BEDTIME 15 mL 3   lisinopril-hydrochlorothiazide (ZESTORETIC) 20-25 MG tablet Take 1 tablet by mouth daily. 90 tablet 0   nystatin-triamcinolone (MYCOLOG II) cream APPLY TO AFFECTED AREATTWICE DAILY. (Patient not taking: Reported on 02/27/2021)     SSD 1 % cream Apply topically daily. (Patient not taking: No sig reported)     traZODone (DESYREL) 50 MG tablet TAKE ONE TABLET DAILY AT BEDTIME. 90 tablet 0   No current facility-administered medications for this visit.     Past Surgical History:  Procedure Laterality Date   ABDOMINAL AORTOGRAM W/LOWER EXTREMITY N/A 08/22/2018   Procedure: ABDOMINAL AORTOGRAM W/LOWER EXTREMITY;  Surgeon: 14/05/2018, MD;  Location: [facility] INVASIVE CV LAB;  Service: Cardiovascular;  Laterality: N/A;   BACK SURGERY     CARPAL TUNNEL WITH CUBITAL TUNNEL Right 03/21/2019   Procedure: RIGHT CARPAL TUNNEL AND CUBITAL  TUNNEL RELEASES;  Surgeon: 05/22/2019, MD;  Location: [facility];  Service: Orthopedics;  Laterality: Right;  AXILLARY BLOCK   COLONOSCOPY  01/2007   Dr. 02/2007, reviewed report,  mild diverticulosis. Prep adequate. Next TCS due 01/2017   ESOPHAGOGASTRODUODENOSCOPY N/A 10/21/2012   [id number] GASTRITIS   MASS EXCISION Right 10/26/2012   Procedure: EXCISION MASS;  Surgeon: [name], MD;  Location: [facility];  Service: General;  Laterality: Right;   PERIPHERAL VASCULAR INTERVENTION  08/22/2018   Procedure: PERIPHERAL VASCULAR INTERVENTION;  Surgeon: [name], MD;  Location: [facility] INVASIVE CV LAB;  Service: Cardiovascular;;  bilateral external iliac stents   scalp mass  4-5 yrs ago   Dr Angelique Holm NERVE TRANSPOSITION Right 03/21/2019   Procedure: DECOMPRESSION RIGHT ULNAR NERVE;  Surgeon: [name], MD;  Location: [facility];  Service: Orthopedics;  Laterality: Right;     No Known  Allergies    Family History  Problem Relation Age of Onset   Diabetes Mother    Diabetes Father    Diabetes Brother    Colon cancer Neg Hx    Liver disease Neg Hx      Social History Chase Briggs reports that he has been smoking cigarettes. He has a 35.00 pack-year smoking history. He has never used smokeless tobacco. Chase Briggs reports no history of alcohol use.   Review of Systems CONSTITUTIONAL: No weight loss, fever, chills, weakness or fatigue.  HEENT: Eyes: No visual loss, blurred vision, double vision or yellow sclerae.No hearing loss, sneezing, congestion, runny nose or sore throat.  SKIN: No rash or itching.  CARDIOVASCULAR: per hpi RESPIRATORY: No shortness of breath, cough or sputum.  GASTROINTESTINAL: No anorexia, nausea, vomiting or diarrhea. No abdominal pain or blood.  GENITOURINARY: No burning on urination, no polyuria NEUROLOGICAL: No headache, dizziness, syncope, paralysis, ataxia, numbness or tingling in the extremities. No change in bowel or bladder control.  MUSCULOSKELETAL: No muscle, back pain, joint pain or stiffness.  LYMPHATICS: No enlarged nodes. No history of splenectomy.  PSYCHIATRIC: No history of depression or anxiety.  ENDOCRINOLOGIC: No reports of sweating, cold or heat intolerance. No polyuria or polydipsia.  Marland Kitchen   Physical Examination Today's Vitals   04/17/21 1342  BP: 134/64  Pulse: 74  SpO2: 97%  Weight: 130 lb (59 kg)  Height: 5\' 7"  (1.702 m)   Body mass index is 20.36 kg/m.  Gen: resting comfortably, no acute distress HEENT: no scleral icterus, pupils equal round and reactive, no palptable cervical adenopathy,  CV: RRR, no m/rg, no jvd Resp: Clear to auscultation bilaterally GI: abdomen is soft, non-tender, non-distended, normal bowel sounds, no hepatosplenomegaly MSK: extremities are warm, no edema.  Skin: warm, no rash Neuro:  no focal deficits Psych: appropriate affect     Assessment and Plan  1. Chest  pain -prior atypical chest pain symptoms have resolved - continue to monitor at this time, no indication for stress testing at this time - continue risk factor modification.          , M.D.

## 2021-04-17 NOTE — Patient Instructions (Signed)

## 2021-04-21 DIAGNOSIS — L603 Nail dystrophy: Secondary | ICD-10-CM | POA: Diagnosis not present

## 2021-04-21 DIAGNOSIS — L851 Acquired keratosis [keratoderma] palmaris et plantaris: Secondary | ICD-10-CM | POA: Diagnosis not present

## 2021-04-21 DIAGNOSIS — M79674 Pain in right toe(s): Secondary | ICD-10-CM | POA: Diagnosis not present

## 2021-04-21 DIAGNOSIS — E1151 Type 2 diabetes mellitus with diabetic peripheral angiopathy without gangrene: Secondary | ICD-10-CM | POA: Diagnosis not present

## 2021-05-28 ENCOUNTER — Encounter: Payer: Self-pay | Admitting: Emergency Medicine

## 2021-05-28 ENCOUNTER — Ambulatory Visit
Admission: EM | Admit: 2021-05-28 | Discharge: 2021-05-28 | Disposition: A | Discharge status: Home / Self Care | Payer: Medicare Other | Attending: Family Medicine | Admitting: Family Medicine

## 2021-05-28 ENCOUNTER — Other Ambulatory Visit: Payer: Self-pay

## 2021-05-28 DIAGNOSIS — I10 Essential (primary) hypertension: Secondary | ICD-10-CM

## 2021-05-28 MED ORDER — CLONIDINE HCL 0.3 MG PO TABS: 0.3000 mg | ORAL_TABLET | Freq: Every day | ORAL | 0 refills | Status: DC

## 2021-05-28 NOTE — ED Provider Notes (Signed)
[place]   [id number] 05/28/21 Arrival Time: 0906  ASSESSMENT & PLAN:  1. Essential hypertension    Refill: Meds ordered this encounter  Medications   cloNIDine (CATAPRES) 0.3 MG tablet    Sig: Take 1 tablet (0.3 mg total) by mouth daily.    Dispense:  90 tablet    Refill:  0   HTN controlled.   Follow-up Information     [facility].   Specialty: Urgent Care Why: As needed. Contact information: [address] [place] [address] [phone]                Reviewed expectations re: course of current medical issues. Questions answered. Outlined signs and symptoms indicating need for more acute intervention. Patient verbalized understanding. After Visit Summary given.   SUBJECTIVE:  Chase Briggs is a 70 y.o. male who presents for BP med refill. Controlled HTN. No symptoms. Has appt with new PCP 07/2021.  Social History   Tobacco Use  Smoking Status Every Day   Packs/day: 1.00   Years: 35.00   Pack years: 35.00   Types: Cigarettes  Smokeless Tobacco Never     OBJECTIVE:  Vitals:   05/28/21 1011  BP: 112/76  Pulse: (!) 102  Resp: 16  Temp: 97.9 F (36.6 C)  TempSrc: Oral  SpO2: 94%    General appearance: alert; no distress Eyes: PERRLA; EOMI HENT: normocephalic; atraumatic Neck: supple Lungs: clear to auscultation bilaterally Heart: regular; very slight tachycardia noted Psychological: alert and cooperative; normal mood and affect   Labs: Results for orders placed or performed in visit on 02/28/21  BMP with eGFR([facility])  Result Value Ref Range   Glucose, Bld 137 (H) 65 - 99 mg/dL   BUN 15 7 - 25 mg/dL   Creat 1.25 0.70 - 1.25 mg/dL   GFR, Est Non African American 58 (L) > OR = 60 mL/min/1.20m2   GFR, Est African American 68 > OR = 60 mL/min/1.68m2   BUN/Creatinine Ratio NOT APPLICABLE 6 - 22 (calc)   Sodium 135 135 - 146 mmol/L   Potassium 4.4 3.5 - 5.3  mmol/L   Chloride 100 98 - 110 mmol/L   CO2 26 20 - 32 mmol/L   Calcium 9.8 8.6 - 10.3 mg/dL    No Known Allergies  Past Medical History:  Diagnosis Date   Diabetes mellitus    Fatty liver    GERD (gastroesophageal reflux disease)    HTN (hypertension)    Hyperlipidemia    Insomnia    Social History   Socioeconomic History   Marital status: Married    Spouse name: Not on file   Number of children: 2   Years of education: Not on file   Highest education level: Not on file  Occupational History   Occupation: reitred    Comment: [employer], heavy lifting  Tobacco Use   Smoking status: Every Day    Packs/day: 1.00    Years: 35.00    Pack years: 35.00    Types: Cigarettes   Smokeless tobacco: Never  Vaping Use   Vaping Use: Never used  Substance and Sexual Activity   Alcohol use: No    Comment: hx heavy etoh x 3 yrs, quit 30+ yrs ago   Drug use: No   Sexual activity: Yes    Birth control/protection: None  Other Topics Concern   Not on file  Social History Narrative   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 Highest level of education: 12th grade. Married for 12 years. Has 2 sons (47 and 52). Lives with wife.   Social Determinants of Health   Financial Resource Strain: Not on file  Food Insecurity: Not on file  Transportation Needs: Not on file  Physical Activity: Not on file  Stress: Not on file  Social Connections: Not on file  Intimate Partner Violence: Not on file   Family History  Problem Relation Age of Onset   Diabetes Mother    Diabetes Father    Diabetes Brother    Colon cancer Neg Hx    Liver disease Neg Hx    Past Surgical History:  Procedure Laterality Date   ABDOMINAL AORTOGRAM W/LOWER EXTREMITY N/A 08/22/2018   Procedure: ABDOMINAL AORTOGRAM W/LOWER EXTREMITY;  Surgeon: Waynetta Sandy, MD;  Location: [place] [facility];  Service: Cardiovascular;  Laterality: N/A;   BACK SURGERY     CARPAL TUNNEL WITH CUBITAL TUNNEL Right 03/21/2019    Procedure: RIGHT CARPAL TUNNEL AND CUBITAL  TUNNEL RELEASES;  Surgeon: Daryll Brod, MD;  Location: [facility];  Service: Orthopedics;  Laterality: Right;  AXILLARY BLOCK   COLONOSCOPY  01/2007   Dr. Arnoldo Morale, reviewed report, mild diverticulosis. Prep adequate. Next TCS due 01/2017   ESOPHAGOGASTRODUODENOSCOPY N/A 10/21/2012   [id number] GASTRITIS   MASS EXCISION Right 10/26/2012   Procedure: EXCISION MASS;  Surgeon: Jamesetta So, MD;  Location: [facility];  Service: General;  Laterality: Right;   PERIPHERAL VASCULAR INTERVENTION  08/22/2018   Procedure: PERIPHERAL VASCULAR INTERVENTION;  Surgeon: Waynetta Sandy, MD;  Location: [place] [facility];  Service: Cardiovascular;;  bilateral external iliac stents   scalp mass  4-5 yrs ago   Dr Caralyn Guile NERVE TRANSPOSITION Right 03/21/2019   Procedure: DECOMPRESSION RIGHT ULNAR NERVE;  Surgeon: Daryll Brod, MD;  Location: [facility];  Service: Orthopedics;  Laterality: Right;       Vanessa Kick, MD 05/28/21 [phone]

## 2021-05-28 NOTE — ED Triage Notes (Signed)
Medication refill  clonidine 0.3mg   unable to see PCP until nov. 8th

## 2021-06-05 ENCOUNTER — Ambulatory Visit (INDEPENDENT_AMBULATORY_CARE_PROVIDER_SITE_OTHER): Payer: Medicare Other | Admitting: Nurse Practitioner

## 2021-06-10 ENCOUNTER — Other Ambulatory Visit: Payer: Self-pay

## 2021-06-10 ENCOUNTER — Ambulatory Visit
Admission: EM | Admit: 2021-06-10 | Discharge: 2021-06-10 | Disposition: A | Discharge status: Home / Self Care | Payer: Medicare Other | Attending: Internal Medicine | Admitting: Internal Medicine

## 2021-06-10 ENCOUNTER — Encounter: Payer: Self-pay | Admitting: Emergency Medicine

## 2021-06-10 DIAGNOSIS — I739 Peripheral vascular disease, unspecified: Secondary | ICD-10-CM

## 2021-06-10 DIAGNOSIS — G4700 Insomnia, unspecified: Secondary | ICD-10-CM

## 2021-06-10 DIAGNOSIS — Z760 Encounter for issue of repeat prescription: Secondary | ICD-10-CM | POA: Diagnosis not present

## 2021-06-10 DIAGNOSIS — I10 Essential (primary) hypertension: Secondary | ICD-10-CM

## 2021-06-10 MED ORDER — CLONIDINE HCL 0.3 MG PO TABS: 0.3000 mg | ORAL_TABLET | Freq: Every day | ORAL | 0 refills | Status: DC

## 2021-06-10 MED ORDER — LISINOPRIL-HYDROCHLOROTHIAZIDE 20-25 MG PO TABS: 1.0000 | ORAL_TABLET | Freq: Every day | ORAL | 0 refills | Status: DC

## 2021-06-10 MED ORDER — AMLODIPINE BESYLATE 10 MG PO TABS: 10.0000 mg | ORAL_TABLET | Freq: Every day | ORAL | 1 refills | Status: DC

## 2021-06-10 MED ORDER — TRAZODONE HCL 50 MG PO TABS
50.0000 mg | ORAL_TABLET | Freq: Every day | ORAL | 0 refills | Status: DC
Start: 2021-06-10 — End: 2021-09-10

## 2021-06-10 MED ORDER — ATORVASTATIN CALCIUM 10 MG PO TABS: 10.0000 mg | ORAL_TABLET | Freq: Every day | ORAL | 1 refills | Status: DC

## 2021-06-10 MED ORDER — CLOPIDOGREL BISULFATE 75 MG PO TABS: 75.0000 mg | ORAL_TABLET | Freq: Every day | ORAL | 0 refills | Status: DC

## 2021-06-10 NOTE — Discharge Instructions (Signed)
I have sent your medications to the pharmacy on file Please go to the pharmacy and pick up your medications Return to urgent care if you have any further concerns.

## 2021-06-10 NOTE — ED Provider Notes (Signed)
[facility]    CSN: [id number] Arrival date & time: 06/10/21  1194      History   Chief Complaint No chief complaint on file.   HPI Chase Briggs is a 70 y.o. male comes to the urgent care for medication refill.  Patient is a diabetic hypertensive.  His primary care physician recently passed away and he is run out of his routine medications.  He has an appointment with his new primary care physician sometime in November.Marland Kitchen   HPI  Past Medical History:  Diagnosis Date   Diabetes mellitus    Fatty liver    GERD (gastroesophageal reflux disease)    HTN (hypertension)    Hyperlipidemia    Insomnia     Patient Active Problem List   Diagnosis Date Noted   Insomnia 12/12/2019   Current smoker 08/31/2017   Mixed hyperlipidemia 08/27/2015   Vitamin D deficiency 08/27/2015   Uncontrolled type 2 diabetes mellitus with stage 2 chronic kidney disease, with long-term current use of insulin (HCC) 02/20/2014   Severe protein-calorie malnutrition (HCC) 02/20/2014   Fatty liver 09/21/2012   Weight loss 09/21/2012   Sternum pain 05/20/2011   Screen for colon cancer 05/20/2011   RUQ pain 01/07/2011   Essential hypertension 01/07/2011    Past Surgical History:  Procedure Laterality Date   ABDOMINAL AORTOGRAM W/LOWER EXTREMITY N/A 08/22/2018   Procedure: ABDOMINAL AORTOGRAM W/LOWER EXTREMITY;  Surgeon: [name], MD;  Location: [facility] INVASIVE CV LAB;  Service: Cardiovascular;  Laterality: N/A;   BACK SURGERY     CARPAL TUNNEL WITH CUBITAL TUNNEL Right 03/21/2019   Procedure: RIGHT CARPAL TUNNEL AND CUBITAL  TUNNEL RELEASES;  Surgeon: [name], MD;  Location: [facility];  Service: Orthopedics;  Laterality: Right;  AXILLARY BLOCK   COLONOSCOPY  01/2007   Dr. [name], reviewed report, mild diverticulosis. Prep adequate. Next TCS due 01/2017   ESOPHAGOGASTRODUODENOSCOPY N/A 10/21/2012   [id number] GASTRITIS   MASS EXCISION Right 10/26/2012    Procedure: EXCISION MASS;  Surgeon: [name], MD;  Location: [facility];  Service: General;  Laterality: Right;   PERIPHERAL VASCULAR INTERVENTION  08/22/2018   Procedure: PERIPHERAL VASCULAR INTERVENTION;  Surgeon: [name], MD;  Location: [facility] INVASIVE CV LAB;  Service: Cardiovascular;;  bilateral external iliac stents   scalp mass  4-5 yrs ago   Dr Angelique Holm NERVE TRANSPOSITION Right 03/21/2019   Procedure: DECOMPRESSION RIGHT ULNAR NERVE;  Surgeon: [name], MD;  Location: [facility];  Service: Orthopedics;  Laterality: Right;       Home Medications    Prior to Admission medications   Medication Sig Start Date End Date Taking? Authorizing Provider  amLODipine (NORVASC) 10 MG tablet Take 1 tablet (10 mg total) by mouth daily. 06/10/21   Merrilee Jansky, MD  aspirin EC 81 MG tablet Take 81 mg by mouth daily.    [provider]  atorvastatin (LIPITOR) 10 MG tablet Take 1 tablet (10 mg total) by mouth daily. 06/10/21 09/08/21  Merrilee Jansky, MD  Cholecalciferol (VITAMIN D) 125 MCG (5000 UT) CAPS Take 2,000 Units by mouth daily.    [provider]  cloNIDine (CATAPRES) 0.3 MG tablet Take 1 tablet (0.3 mg total) by mouth daily. 06/10/21   Merrilee Jansky, MD  clopidogrel (PLAVIX) 75 MG tablet Take 1 tablet (75 mg total) by mouth daily. 06/10/21   Merrilee Jansky, MD  Ferrous Sulfate (IRON) 325 (65 Fe) MG  TABS Take 325 mg by mouth daily.    [provider]  fluticasone (FLONASE) 50 MCG/ACT nasal spray Place 1 spray into both nostrils daily. 02/12/21   Elenore Paddy, NP  glucose blood test strip 1 each by Other route 2 (two) times daily. Use as instructed bid E11.65 Relion Premier 05/15/19   Roma Kayser, MD  Insulin Pen Needle (SURE COMFORT PEN NEEDLES) 32G X 4 MM MISC USE ONCE DAILY 02/07/21   Roma Kayser, MD  Lancets MISC 1 each by Does not apply route 2 (two) times daily. E11.65 Relion Premier 05/15/19    Roma Kayser, MD  LANTUS SOLOSTAR 100 UNIT/ML Solostar Pen INJECT 14 UNITS UNDER THE SKIN AT BEDTIME 03/18/21   Roma Kayser, MD  lisinopril-hydrochlorothiazide (ZESTORETIC) 20-25 MG tablet Take 1 tablet by mouth daily. 06/10/21   Merrilee Jansky, MD  nystatin-triamcinolone (MYCOLOG II) cream  09/23/20   [provider]  SSD 1 % cream Apply topically daily. 05/14/20   [provider]  traZODone (DESYREL) 50 MG tablet Take 1 tablet (50 mg total) by mouth at bedtime. 06/10/21 09/08/21  Merrilee Jansky, MD    Family History Family History  Problem Relation Age of Onset   Diabetes Mother    Diabetes Father    Diabetes Brother    Colon cancer Neg Hx    Liver disease Neg Hx     Social History Social History   Tobacco Use   Smoking status: Every Day    Packs/day: 1.00    Years: 35.00    Pack years: 35.00    Types: Cigarettes   Smokeless tobacco: Never  Vaping Use   Vaping Use: Never used  Substance Use Topics   Alcohol use: No    Comment: hx heavy etoh x 3 yrs, quit 30+ yrs ago   Drug use: No     Allergies   Patient has no known allergies.   Review of Systems Review of Systems  All other systems reviewed and are negative.   Physical Exam Triage Vital Signs ED Triage Vitals  Enc Vitals Group     BP 06/10/21 1055 (!) 172/77     Pulse Rate 06/10/21 1055 71     Resp 06/10/21 1055 18     Temp 06/10/21 1055 97.9 F (36.6 C)     Temp Source 06/10/21 1055 Oral     SpO2 06/10/21 1055 97 %     Weight --      Height --      Head Circumference --      Peak Flow --      Pain Score 06/10/21 1056 0     Pain Loc --      Pain Edu? --      Excl. in GC? --    No data found.  Updated Vital Signs BP (!) 172/77 (BP Location: Right Arm)   Pulse 71   Temp 97.9 F (36.6 C) (Oral)   Resp 18   SpO2 97%   Visual Acuity Right Eye Distance:   Left Eye Distance:   Bilateral Distance:    Right Eye Near:   Left Eye Near:    Bilateral  Near:     Physical Exam Vitals and nursing note reviewed.  Constitutional:      General: He is not in acute distress.    Appearance: He is not ill-appearing.  Cardiovascular:     Rate and Rhythm: Normal rate and regular rhythm.  Pulmonary:     Effort: Pulmonary effort is normal.     Breath sounds: Normal breath sounds.  Abdominal:     General: Bowel sounds are normal.     Palpations: Abdomen is soft.  Neurological:     Mental Status: He is alert.     UC Treatments / Results  Labs (all labs ordered are listed, but only abnormal results are displayed) Labs Reviewed - No data to display  EKG   Radiology No results found.  Procedures Procedures (including critical care time)  Medications Ordered in UC Medications - No data to display  Initial Impression / Assessment and Plan / UC Course  I have reviewed the triage vital signs and the nursing notes.  Pertinent labs & imaging results that were available during my care of the patient were reviewed by me and considered in my medical decision making (see chart for details).     1.  Encounter for medication refill: Medication refills have been sent Amlodipine, atorvastatin, trazodone, lisinopril-hydrochlorothiazide, clonidine, and Plavix were sent. Return precautions given. Final Clinical Impressions(s) / UC Diagnoses   Final diagnoses:  Encounter for medication refill  PAD (peripheral artery disease) (HCC)  Insomnia, unspecified type  Essential hypertension     Discharge Instructions      I have sent your medications to the pharmacy on file Please go to the pharmacy and pick up your medications Return to urgent care if you have any further concerns.   ED Prescriptions     Medication Sig Dispense Auth. Provider   amLODipine (NORVASC) 10 MG tablet Take 1 tablet (10 mg total) by mouth daily. 90 tablet Tailynn Armetta, Britta Mccreedy, MD   atorvastatin (LIPITOR) 10 MG tablet Take 1 tablet (10 mg total) by mouth daily. 90  tablet Tavares Levinson, Britta Mccreedy, MD   traZODone (DESYREL) 50 MG tablet Take 1 tablet (50 mg total) by mouth at bedtime. 90 tablet Shavonda Wiedman, Britta Mccreedy, MD   lisinopril-hydrochlorothiazide (ZESTORETIC) 20-25 MG tablet Take 1 tablet by mouth daily. 90 tablet Liliann File, Britta Mccreedy, MD   cloNIDine (CATAPRES) 0.3 MG tablet Take 1 tablet (0.3 mg total) by mouth daily. 90 tablet Mayleen Borrero, Britta Mccreedy, MD   clopidogrel (PLAVIX) 75 MG tablet Take 1 tablet (75 mg total) by mouth daily. 90 tablet Elba Schaber, Britta Mccreedy, MD      PDMP not reviewed this encounter.   Merrilee Jansky, MD 06/10/21 [phone]

## 2021-06-10 NOTE — ED Triage Notes (Signed)
Needs refill on medications,  unable to get in to see his PCP  Needs refill on atorvastatin 10mg  1 daily  Trazodone 50 mg 1 at bedtime

## 2021-06-30 DIAGNOSIS — L851 Acquired keratosis [keratoderma] palmaris et plantaris: Secondary | ICD-10-CM | POA: Diagnosis not present

## 2021-06-30 DIAGNOSIS — E1151 Type 2 diabetes mellitus with diabetic peripheral angiopathy without gangrene: Secondary | ICD-10-CM | POA: Diagnosis not present

## 2021-06-30 DIAGNOSIS — M79674 Pain in right toe(s): Secondary | ICD-10-CM | POA: Diagnosis not present

## 2021-06-30 DIAGNOSIS — L603 Nail dystrophy: Secondary | ICD-10-CM | POA: Diagnosis not present

## 2021-07-03 DIAGNOSIS — Z23 Encounter for immunization: Secondary | ICD-10-CM | POA: Diagnosis not present

## 2021-07-04 ENCOUNTER — Other Ambulatory Visit: Payer: Self-pay

## 2021-07-04 ENCOUNTER — Encounter: Payer: Self-pay | Admitting: Urology

## 2021-07-04 ENCOUNTER — Ambulatory Visit (INDEPENDENT_AMBULATORY_CARE_PROVIDER_SITE_OTHER): Payer: Medicare Other | Admitting: Urology

## 2021-07-04 VITALS — BP 154/67 | HR 102

## 2021-07-04 DIAGNOSIS — N489 Disorder of penis, unspecified: Secondary | ICD-10-CM | POA: Diagnosis not present

## 2021-07-04 DIAGNOSIS — I2510 Atherosclerotic heart disease of native coronary artery without angina pectoris: Secondary | ICD-10-CM

## 2021-07-04 MED ORDER — NYSTATIN-TRIAMCINOLONE 100000-0.1 UNIT/GM-% EX CREA: TOPICAL_CREAM | Freq: Two times a day (BID) | CUTANEOUS | 3 refills | Status: DC

## 2021-07-04 NOTE — Patient Instructions (Signed)
Balanitis Balanitis is swelling and irritation of the head of the penis (glans penis). Balanitis occurs most often among males who have not had their foreskin removed (uncircumcised). In uncircumcised males, the condition may also cause inflammation of the skin around the foreskin. Balanitis sometimes causes scarring of the penis or foreskin, which can require surgery. This condition may develop because of an infection or another medical condition. Untreated balanitis can increase the risk of penile cancer. What are the causes? Common causes of this condition include: Poor personal hygiene, especially in uncircumcised males. Not cleaning the glans penis and foreskin well can result in a buildup of bacteria, viruses, and yeast, which can lead to infection and inflammation. Irritation and lack of air flow due to fluid (smegma) that can build up on the glans penis. Other causes include: Chemical irritation from products such as soaps or shower gels, especially those that have fragrance. Chemical irritation can also be caused by condoms, personal lubricants, petroleum jelly, spermicides, or fabric softeners. Skin conditions, such as eczema, dermatitis, and psoriasis. Allergies to medicines, such as tetracycline and sulfa drugs. What increases the risk? The following factors may make you more likely to develop this condition: Being an uncircumcised male. Having diabetes. Having other medical conditions, including liver cirrhosis, congestive heart failure, or kidney disease. Having infections, such as candidiasis, HPV (human papillomavirus), herpes simplex, gonorrhea, or syphilis. Having a tight foreskin that is difficult to pull back (retract) past the glans penis. Being severely obese. What are the signs or symptoms? Symptoms of this condition include: Discharge from under the foreskin, and pain or difficulty retracting the foreskin. A bad smell or itchiness on the penis. Tenderness, redness, and  swelling of the glans penis. A rash or sores on the glans penis or foreskin. Inability to get an erection due to pain. Difficulty urinating. Scarring of the penis or foreskin, in some cases. How is this diagnosed? This condition may be diagnosed based on a physical exam and tests of a swab of discharge to check for bacterial or fungal infection. You may also have blood tests to check for: Viruses that can cause balanitis. A high blood sugar (glucose) level. This could be a sign of diabetes, which can increase the risk of balanitis. How is this treated? Treatment for balanitis depends on the cause. Treatment may include: Improving personal hygiene. Your health care provider may recommend sitting in a bath of warm water that is deep enough to cover your hips and buttocks (sitz bath). Taking medicines such as: Creams or ointments to reduce swelling (steroids) or to treat an infection. Antibiotic medicine. Antifungal medicine. Having surgery to remove or cut the foreskin (circumcision). This may be done if you have scarring on the foreskin that makes it difficult to retract. Controlling other medical problems that may be causing your condition or making it worse. Follow these instructions at home: Medicines Take over-the-counter and prescription medicines only as told by your health care provider. If you were prescribed an antibiotic medicine or a cream or ointment, use it as told by your health care provider. Do not stop using your medicine, cream, or ointment even if you start to feel better. General instructions Do not have sex until the condition clears up, or until your health care provider approves. Keep your penis clean and dry. Take sitz baths as recommended by your health care provider. Avoid products that irritate your skin or make symptoms worse, such as soaps and shower gels that have fragrance. Keep all follow-up visits   as told by your health care provider. This is  important. Contact a health care provider if: Your symptoms get worse or do not improve with home care. You develop chills or a fever. You have trouble urinating. You cannot retract your foreskin. Get help right away if: You develop severe pain. You are unable to urinate. Summary Balanitis is inflammation of the head of the penis (glans penis) caused by irritation or infection. This condition is most common among uncircumcised males. Balanitis causes pain, redness, and swelling of the glans penis. Good personal hygiene is important. Treatment may include improving personal hygiene and applying creams or ointments. Contact a health care provider if your symptoms get worse or do not improve with home care. This information is not intended to replace advice given to you by your health care provider. Make sure you discuss any questions you have with your health care provider. Document Revised: 06/21/2019 Document Reviewed: 06/21/2019 Elsevier Patient Education  2022 [employer].  

## 2021-07-04 NOTE — Progress Notes (Signed)
07/04/2021 9:17 AM   Roseanna Rainbow Fayad 1951-09-03 [id number]  Referring provider: [name], MD No address on file  Followup balanitis   HPI: [name] is a 70yo here for follouwp for balanitis. He has used the nystatin cream 1-2x per month. No recurrance of the balnitis. No significant LUTS. No other complaints today.   PMH: Past Medical History:  Diagnosis Date   Diabetes mellitus    Fatty liver    GERD (gastroesophageal reflux disease)    HTN (hypertension)    Hyperlipidemia    Insomnia     Surgical History: Past Surgical History:  Procedure Laterality Date   ABDOMINAL AORTOGRAM W/LOWER EXTREMITY N/A 08/22/2018   Procedure: ABDOMINAL AORTOGRAM W/LOWER EXTREMITY;  Surgeon: [name], MD;  Location: [facility] INVASIVE CV LAB;  Service: Cardiovascular;  Laterality: N/A;   BACK SURGERY     CARPAL TUNNEL WITH CUBITAL TUNNEL Right 03/21/2019   Procedure: RIGHT CARPAL TUNNEL AND CUBITAL  TUNNEL RELEASES;  Surgeon: [name], MD;  Location: [facility];  Service: Orthopedics;  Laterality: Right;  AXILLARY BLOCK   COLONOSCOPY  01/2007   Dr. [name], reviewed report, mild diverticulosis. Prep adequate. Next TCS due 01/2017   ESOPHAGOGASTRODUODENOSCOPY N/A 10/21/2012   [id number] GASTRITIS   MASS EXCISION Right 10/26/2012   Procedure: EXCISION MASS;  Surgeon: [name], MD;  Location: [facility];  Service: General;  Laterality: Right;   PERIPHERAL VASCULAR INTERVENTION  08/22/2018   Procedure: PERIPHERAL VASCULAR INTERVENTION;  Surgeon: [name], MD;  Location: [facility] INVASIVE CV LAB;  Service: Cardiovascular;;  bilateral external iliac stents   scalp mass  4-5 yrs ago   Dr Angelique Holm NERVE TRANSPOSITION Right 03/21/2019   Procedure: DECOMPRESSION RIGHT ULNAR NERVE;  Surgeon: [name], MD;  Location: [facility];  Service: Orthopedics;  Laterality: Right;    Home Medications:  Allergies as of 07/04/2021    No Known Allergies      Medication List        Accurate as of July 04, 2021  9:17 AM. If you have any questions, ask your nurse or doctor.          amLODipine 10 MG tablet Commonly known as: NORVASC Take 1 tablet (10 mg total) by mouth daily.   aspirin EC 81 MG tablet Take 81 mg by mouth daily.   atorvastatin 10 MG tablet Commonly known as: LIPITOR Take 1 tablet (10 mg total) by mouth daily.   cloNIDine 0.3 MG tablet Commonly known as: CATAPRES Take 1 tablet (0.3 mg total) by mouth daily.   clopidogrel 75 MG tablet Commonly known as: PLAVIX Take 1 tablet (75 mg total) by mouth daily.   fluticasone 50 MCG/ACT nasal spray Commonly known as: FLONASE Place 1 spray into both nostrils daily.   glucose blood test strip 1 each by Other route 2 (two) times daily. Use as instructed bid E11.65 Relion Premier   Iron 325 (65 Fe) MG Tabs Take 325 mg by mouth daily.   Lancets Misc 1 each by Does not apply route 2 (two) times daily. E11.65 Relion Premier   Lantus SoloStar 100 UNIT/ML Solostar Pen Generic drug: insulin glargine INJECT 14 UNITS UNDER THE SKIN AT BEDTIME   lisinopril-hydrochlorothiazide 20-25 MG tablet Commonly known as: ZESTORETIC Take 1 tablet by mouth daily.   nystatin-triamcinolone cream Commonly known as: MYCOLOG II   SSD 1 % cream Generic drug: silver sulfADIAZINE Apply topically daily.   Sure Comfort Pen  Needles 32G X 4 MM Misc Generic drug: Insulin Pen Needle USE ONCE DAILY   traZODone 50 MG tablet Commonly known as: DESYREL Take 1 tablet (50 mg total) by mouth at bedtime.   Vitamin D 125 MCG (5000 UT) Caps Take 2,000 Units by mouth daily.        Allergies: No Known Allergies  Family History: Family History  Problem Relation Age of Onset   Diabetes Mother    Diabetes Father    Diabetes Brother    Colon cancer Neg Hx    Liver disease Neg Hx     Social History:  reports that he has been smoking cigarettes. He has a 35.00  pack-year smoking history. He has never used smokeless tobacco. He reports that he does not drink alcohol and does not use drugs.  ROS: All other review of systems were reviewed and are negative except what is noted above in HPI  Physical Exam: BP (!) 154/67   Pulse (!) 102   Constitutional:  Alert and oriented, No acute distress. HEENT: [place] AT, moist mucus membranes.  Trachea midline, no masses. Cardiovascular: No clubbing, cyanosis, or edema. Respiratory: Normal respiratory effort, no increased work of breathing. GI: Abdomen is soft, nontender, nondistended, no abdominal masses GU: No CVA tenderness. Uncircumcised phallus. No masses/lesions on penis, testis, scrotum.  Lymph: No cervical or inguinal lymphadenopathy. Skin: No rashes, bruises or suspicious lesions. Neurologic: Grossly intact, no focal deficits, moving all 4 extremities. Psychiatric: Normal mood and affect.  Laboratory Data: Lab Results  Component Value Date   WBC 7.8 01/28/2021   HGB 12.4 (L) 01/28/2021   HCT 37.0 (L) 01/28/2021   MCV 95.4 01/28/2021   PLT 386 01/28/2021    Lab Results  Component Value Date   CREATININE 1.25 03/19/2021    No results found for: PSA  No results found for: TESTOSTERONE  Lab Results  Component Value Date   HGBA1C 6.5 02/21/2021    Urinalysis    Component Value Date/Time   COLORURINE YELLOW 01/28/2021 1223   APPEARANCEUR HAZY (A) 01/28/2021 1223   APPEARANCEUR Clear 01/01/2021 0857   LABSPEC 1.005 01/28/2021 1223   PHURINE 6.0 01/28/2021 1223   GLUCOSEU 50 (A) 01/28/2021 1223   HGBUR SMALL (A) 01/28/2021 1223   BILIRUBINUR NEGATIVE 01/28/2021 1223   BILIRUBINUR Negative 01/01/2021 0857   KETONESUR NEGATIVE 01/28/2021 1223   PROTEINUR NEGATIVE 01/28/2021 1223   UROBILINOGEN 0.2 02/19/2014 1340   NITRITE NEGATIVE 01/28/2021 1223   LEUKOCYTESUR LARGE (A) 01/28/2021 1223    Lab Results  Component Value Date   LABMICR See below: 01/01/2021   WBCUA 0-5 01/01/2021    LABEPIT 0-10 01/01/2021   MUCUS Present 09/23/2020   BACTERIA RARE (A) 01/28/2021    Pertinent Imaging:  No results found for this or any previous visit.  No results found for this or any previous visit.  No results found for this or any previous visit.  No results found for this or any previous visit.  No results found for this or any previous visit.  No results found for this or any previous visit.  No results found for this or any previous visit.  Results for orders placed during the hospital encounter of 01/28/21  CT Renal Stone Study  Narrative CLINICAL DATA:  Left-sided flank pain and dysuria  EXAM: CT ABDOMEN AND PELVIS WITHOUT CONTRAST  TECHNIQUE: Multidetector CT imaging of the abdomen and pelvis was performed following the standard protocol without IV contrast.  COMPARISON:  10/30/2017  FINDINGS: Lower chest: Emphysematous changes are noted. No focal infiltrate or effusion is seen.  Hepatobiliary: Gallbladder is within normal limits. Area of focal fatty infiltration is noted along the falciform ligament. No other focal abnormality is noted.  Pancreas: Unremarkable. No pancreatic ductal dilatation or surrounding inflammatory changes.  Spleen: Normal in size without focal abnormality.  Adrenals/Urinary Tract: Adrenal glands are within normal limits. Right kidney is well visualized without renal calculi or obstructive changes. The left kidney demonstrates some increased perinephric stranding. A few tiny left renal stones are noted. No stones are identified. The bladder is incompletely distended.  Stomach/Bowel: Scattered diverticular change of the colon is noted. The appendix is well visualized and within normal limits. Small bowel and stomach are unremarkable.  Vascular/Lymphatic: Aortic atherosclerosis. No enlarged abdominal or pelvic lymph nodes.  Reproductive: Prostate is unremarkable.  Other: No abdominal wall hernia or abnormality. No  abdominopelvic ascites.  Musculoskeletal: Degenerative changes of lumbar spine are noted.  IMPRESSION: A few tiny nonobstructing left renal calculi.  Mild diverticular change without diverticulitis.  Mild fatty infiltration of the liver along the falciform ligament.  Aortic Atherosclerosis (ICD10-I70.0) and Emphysema (ICD10-J43.9).   Electronically Signed By: Alcide Clever M.D. On: 01/28/2021 15:18   Assessment & Plan:    1. Penile lesion/balanitis Continue nystatin cream prn -RCT 1 year - Urinalysis, Routine w reflex microscopic   No follow-ups on file.  Wilkie Aye, MD  [facility] [place]

## 2021-07-04 NOTE — Progress Notes (Signed)
Urological Symptom Review ° °Patient is experiencing the following symptoms: °Leakage of urine ° ° °Review of Systems ° °Gastrointestinal (upper)  : °Negative for upper GI symptoms ° °Gastrointestinal (lower) : °Negative for lower GI symptoms ° °Constitutional : °Weight loss ° °Skin: °Negative for skin symptoms ° °Eyes: °Negative for eye symptoms ° °Ear/Nose/Throat : °Negative for Ear/Nose/Throat symptoms ° °Hematologic/Lymphatic: °Negative for Hematologic/Lymphatic symptoms ° °Cardiovascular : °Negative for cardiovascular symptoms ° °Respiratory : °Negative for respiratory symptoms ° °Endocrine: °Negative for endocrine symptoms ° °Musculoskeletal: °Negative for musculoskeletal symptoms ° °Neurological: °Negative for neurological symptoms ° °Psychologic: °Negative for psychiatric symptoms °

## 2021-07-22 ENCOUNTER — Ambulatory Visit (INDEPENDENT_AMBULATORY_CARE_PROVIDER_SITE_OTHER): Payer: Medicare Other | Admitting: Internal Medicine

## 2021-07-22 ENCOUNTER — Other Ambulatory Visit: Payer: Self-pay

## 2021-07-22 ENCOUNTER — Encounter: Payer: Self-pay | Admitting: Internal Medicine

## 2021-07-22 VITALS — BP 124/72 | HR 100 | Temp 98.1°F | Resp 18 | Ht 67.5 in | Wt 134.0 lb

## 2021-07-22 DIAGNOSIS — E782 Mixed hyperlipidemia: Secondary | ICD-10-CM | POA: Diagnosis not present

## 2021-07-22 DIAGNOSIS — E1169 Type 2 diabetes mellitus with other specified complication: Secondary | ICD-10-CM

## 2021-07-22 DIAGNOSIS — I10 Essential (primary) hypertension: Secondary | ICD-10-CM | POA: Diagnosis not present

## 2021-07-22 DIAGNOSIS — J309 Allergic rhinitis, unspecified: Secondary | ICD-10-CM | POA: Insufficient documentation

## 2021-07-22 DIAGNOSIS — Z2821 Immunization not carried out because of patient refusal: Secondary | ICD-10-CM | POA: Diagnosis not present

## 2021-07-22 DIAGNOSIS — I739 Peripheral vascular disease, unspecified: Secondary | ICD-10-CM | POA: Diagnosis not present

## 2021-07-22 DIAGNOSIS — D509 Iron deficiency anemia, unspecified: Secondary | ICD-10-CM

## 2021-07-22 DIAGNOSIS — F17200 Nicotine dependence, unspecified, uncomplicated: Secondary | ICD-10-CM | POA: Insufficient documentation

## 2021-07-22 DIAGNOSIS — J3089 Other allergic rhinitis: Secondary | ICD-10-CM | POA: Diagnosis not present

## 2021-07-22 DIAGNOSIS — Z720 Tobacco use: Secondary | ICD-10-CM

## 2021-07-22 DIAGNOSIS — G4700 Insomnia, unspecified: Secondary | ICD-10-CM

## 2021-07-22 NOTE — Assessment & Plan Note (Signed)
With HTN  Lab Results  Component Value Date   HGBA1C 6.5 02/21/2021   Well-controlled with Lantus 14 units every morning, followed by Dr. Emeline Darling to follow diabetic diet On statin and ACEi F/u CMP and lipid panel Diabetic foot exam: Today Diabetic eye exam: Advised to follow up with Ophthalmology for diabetic eye exam

## 2021-07-22 NOTE — Progress Notes (Signed)
New Patient Office Visit  Subjective:  Patient ID: Chase Briggs, male    DOB: 04-01-1951  Age: 70 y.o. MRN: [id number]  CC:  Chief Complaint  Patient presents with   New Patient (Initial Visit)    New patient was seeing gosrani just establishing care     HPI Chase Briggs is a 70 y.o. male with past medical history of HTN, PAD, allergic rhinitis, type II DM, HLD, insomnia, anemia and tobacco abuse who presents for establishing care.  HTN: BP is well-controlled. Takes medications regularly. Patient denies headache, dizziness, chest pain, dyspnea or palpitations.  He follows up with cardiology as well.  PAD: Has had right and left iliac stent placement, followed by vascular surgery.  He takes aspirin, Plavix and statin.  Denies any recent worsening of claudication symptoms.  Type II DM with HLD: He takes Lantus 14 units in the morning, followed by Dr. Dorris Fetch.  Denies any fatigue, polyuria or polydipsia.  He takes Lipitor for HLD.  Allergic rhinitis and nosebleeds: Complains of intermittent nosebleeds, but now better with Flonase nasal spray.  He takes trazodone for insomnia.  Denies any anhedonia, SI or HI currently.  He takes iron supplements for anemia.  Denies any active signs of bleeding like melena, hematochezia or hematuria currently.  Denies any easy bruising currently.  He smokes about 3 to 4 cigarettes/day, but used to smoke 1.5 pack/day x15 years in the past.  Past Medical History:  Diagnosis Date   Diabetes mellitus    Fatty liver    GERD (gastroesophageal reflux disease)    HTN (hypertension)    Hyperlipidemia    Insomnia     Past Surgical History:  Procedure Laterality Date   ABDOMINAL AORTOGRAM W/LOWER EXTREMITY N/A 08/22/2018   Procedure: ABDOMINAL AORTOGRAM W/LOWER EXTREMITY;  Surgeon: Waynetta Sandy, MD;  Location: [facility];  Service: Cardiovascular;  Laterality: N/A;   BACK SURGERY     CARPAL TUNNEL WITH CUBITAL TUNNEL  Right 03/21/2019   Procedure: RIGHT CARPAL TUNNEL AND CUBITAL  TUNNEL RELEASES;  Surgeon: Daryll Brod, MD;  Location: [facility];  Service: Orthopedics;  Laterality: Right;  AXILLARY BLOCK   COLONOSCOPY  01/2007   Dr. Arnoldo Morale, reviewed report, mild diverticulosis. Prep adequate. Next TCS due 01/2017   ESOPHAGOGASTRODUODENOSCOPY N/A 10/21/2012   [id number] GASTRITIS   MASS EXCISION Right 10/26/2012   Procedure: EXCISION MASS;  Surgeon: Jamesetta So, MD;  Location: [facility];  Service: General;  Laterality: Right;   PERIPHERAL VASCULAR INTERVENTION  08/22/2018   Procedure: PERIPHERAL VASCULAR INTERVENTION;  Surgeon: Waynetta Sandy, MD;  Location: [facility];  Service: Cardiovascular;;  bilateral external iliac stents   scalp mass  4-5 yrs ago   Dr Caralyn Guile NERVE TRANSPOSITION Right 03/21/2019   Procedure: DECOMPRESSION RIGHT ULNAR NERVE;  Surgeon: Daryll Brod, MD;  Location: [facility];  Service: Orthopedics;  Laterality: Right;    Family History  Problem Relation Age of Onset   Diabetes Mother    Diabetes Father    Diabetes Brother    Colon cancer Neg Hx    Liver disease Neg Hx     Social History   Socioeconomic History   Marital status: Married    Spouse name: Not on file   Number of children: 2   Years of education: Not on file   Highest education level: Not on file  Occupational History   Occupation: reitred    Comment:  [employer], heavy lifting  Tobacco Use   Smoking status: Every Day    Packs/day: 1.00    Years: 35.00    Pack years: 35.00    Types: Cigarettes   Smokeless tobacco: Never  Vaping Use   Vaping Use: Never used  Substance and Sexual Activity   Alcohol use: No    Comment: hx heavy etoh x 3 yrs, quit 30+ yrs ago   Drug use: No   Sexual activity: Yes    Birth control/protection: None  Other Topics Concern   Not on file  Social History Narrative   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 Highest level of  education: 12th grade. Married for 12 years. Has 2 sons (33 and 21). Lives with wife.   Social Determinants of Health   Financial Resource Strain: Not on file  Food Insecurity: Not on file  Transportation Needs: Not on file  Physical Activity: Not on file  Stress: Not on file  Social Connections: Not on file  Intimate Partner Violence: Not on file    ROS Review of Systems  Constitutional:  Negative for chills and fever.  HENT:  Negative for congestion and sore throat.   Eyes:  Negative for pain and discharge.  Respiratory:  Negative for cough and shortness of breath.   Cardiovascular:  Negative for chest pain and palpitations.  Gastrointestinal:  Negative for constipation, diarrhea, nausea and vomiting.  Endocrine: Negative for polydipsia and polyuria.  Genitourinary:  Negative for dysuria and hematuria.  Musculoskeletal:  Negative for neck pain and neck stiffness.  Skin:  Negative for rash.  Neurological:  Negative for dizziness, weakness, numbness and headaches.  Psychiatric/Behavioral:  Negative for agitation and behavioral problems.    Objective:   Today's Vitals: BP 124/72 (BP Location: Left Arm, Patient Position: Sitting, Cuff Size: Normal)   Pulse 100   Temp 98.1 F (36.7 C) (Oral)   Resp 18   Ht 5' 7.5" (1.715 m)   Wt 134 lb 0.6 oz (60.8 kg)   SpO2 100%   BMI 20.68 kg/m   Physical Exam Vitals reviewed.  Constitutional:      General: He is not in acute distress.    Appearance: He is not diaphoretic.  HENT:     Head: Normocephalic and atraumatic.     Nose: Nose normal.     Mouth/Throat:     Mouth: Mucous membranes are moist.  Eyes:     General: No scleral icterus.    Extraocular Movements: Extraocular movements intact.  Cardiovascular:     Rate and Rhythm: Normal rate and regular rhythm.     Pulses: Normal pulses.     Heart sounds: Normal heart sounds. No murmur heard. Pulmonary:     Breath sounds: Normal breath sounds. No wheezing or rales.   Abdominal:     Palpations: Abdomen is soft.     Tenderness: There is no abdominal tenderness.  Musculoskeletal:     Cervical back: Neck supple. No tenderness.     Right lower leg: No edema.     Left lower leg: No edema.  Skin:    General: Skin is warm.     Findings: No rash.  Neurological:     General: No focal deficit present.     Mental Status: He is alert and oriented to person, place, and time.  Psychiatric:        Mood and Affect: Mood normal.        Behavior: Behavior normal.    Assessment &  Plan:   Problem List Items Addressed This Visit       Cardiovascular and Mediastinum   Essential hypertension    BP Readings from Last 1 Encounters:  07/22/21 124/72  Well-controlled with amlodipine, lisinopril-HCTZ and clonidine Followed by cardiology Counseled for compliance with the medications Advised DASH diet and moderate exercise/walking, at least 150 mins/week       PAD (peripheral artery disease) ([place])    S/p right and left iliac stent placement Followed by vascular surgery and cardiology On aspirin, Plavix and statin        Respiratory   Allergic rhinitis    Uses Flonase Had intermittent nosebleeds in the past, has been better since using Flonase now        Endocrine   Type 2 diabetes mellitus with other specified complication ([place]) - Primary    With HTN  Lab Results  Component Value Date   HGBA1C 6.5 02/21/2021   Well-controlled with Lantus 14 units every morning, followed by Dr. Dorris Fetch Advised to follow diabetic diet On statin and ACEi F/u CMP and lipid panel Diabetic foot exam: Today Diabetic eye exam: Advised to follow up with Ophthalmology for diabetic eye exam         Other   Mixed hyperlipidemia    On statin Check lipid profile in the next visit      Insomnia    Takes trazodone 50 mg nightly      Tobacco abuse    Smokes about 3-4 cigarettes per day  Asked about quitting: confirms that he currently smokes cigarettes Advise to  quit smoking: Educated about QUITTING to reduce the risk of cancer, cardio and cerebrovascular disease. Assess willingness: Unwilling to quit at this time, but is working on cutting back. Assist with counseling and pharmacotherapy: Counseled for 5 minutes and literature provided. Arrange for follow up:  Follow up in 3 months and continue to offer help.      Iron deficiency anemia    Takes iron supplements daily Denies any active signs of bleeding Check CBC      Relevant Orders   CBC with Differential/Platelet   Other Visit Diagnoses     Refused influenza vaccine           Outpatient Encounter Medications as of 07/22/2021  Medication Sig   aspirin EC 81 MG tablet Take 81 mg by mouth daily.   atorvastatin (LIPITOR) 10 MG tablet Take 1 tablet (10 mg total) by mouth daily.   Cholecalciferol (VITAMIN D) 125 MCG (5000 UT) CAPS Take 2,000 Units by mouth daily.   cloNIDine (CATAPRES) 0.3 MG tablet Take 1 tablet (0.3 mg total) by mouth daily.   clopidogrel (PLAVIX) 75 MG tablet Take 1 tablet (75 mg total) by mouth daily.   Ferrous Sulfate (IRON) 325 (65 Fe) MG TABS Take 325 mg by mouth daily.   fluticasone (FLONASE) 50 MCG/ACT nasal spray Place 1 spray into both nostrils daily.   glucose blood test strip 1 each by Other route 2 (two) times daily. Use as instructed bid E11.65 [facility]   Insulin Pen Needle (SURE COMFORT PEN NEEDLES) 32G X 4 MM MISC USE ONCE DAILY   Lancets MISC 1 each by Does not apply route 2 (two) times daily. E11.65 [facility]   LANTUS SOLOSTAR 100 UNIT/ML Solostar Pen INJECT 14 UNITS UNDER THE SKIN AT BEDTIME   lisinopril-hydrochlorothiazide (ZESTORETIC) 20-25 MG tablet Take 1 tablet by mouth daily.   nystatin-triamcinolone (MYCOLOG II) cream Apply topically 2 (two) times daily.  SSD 1 % cream Apply topically daily.   traZODone (DESYREL) 50 MG tablet Take 1 tablet (50 mg total) by mouth at bedtime.   amLODipine (NORVASC) 10 MG tablet Take 1 tablet (10 mg  total) by mouth daily.   No facility-administered encounter medications on file as of 07/22/2021.    Follow-up: Return in about 6 months (around 01/19/2022) for HTN and DM.   Lindell Spar, MD

## 2021-07-22 NOTE — Assessment & Plan Note (Addendum)
S/p right and left iliac stent placement Followed by vascular surgery and cardiology On aspirin, Plavix and statin 

## 2021-07-22 NOTE — Assessment & Plan Note (Signed)
On statin °Check lipid profile in the next visit °

## 2021-07-22 NOTE — Assessment & Plan Note (Signed)
Uses Flonase Had intermittent nosebleeds in the past, has been better since using Flonase now

## 2021-07-22 NOTE — Patient Instructions (Signed)
Please continue taking medications as prescribed.  Please continue to follow low carb and low salt diet and ambulate as tolerated. 

## 2021-07-22 NOTE — Assessment & Plan Note (Signed)
Takes iron supplements daily Denies any active signs of bleeding Check CBC

## 2021-07-22 NOTE — Assessment & Plan Note (Signed)
BP Readings from Last 1 Encounters:  07/22/21 124/72   Well-controlled with amlodipine, lisinopril-HCTZ and clonidine Followed by cardiology Counseled for compliance with the medications Advised DASH diet and moderate exercise/walking, at least 150 mins/week

## 2021-07-22 NOTE — Assessment & Plan Note (Signed)
Smokes about 3-4 cigarettes per day  Asked about quitting: confirms that he currently smokes cigarettes Advise to quit smoking: Educated about QUITTING to reduce the risk of cancer, cardio and cerebrovascular disease. Assess willingness: Unwilling to quit at this time, but is working on cutting back. Assist with counseling and pharmacotherapy: Counseled for 5 minutes and literature provided. Arrange for follow up:  Follow up in 3 months and continue to offer help.

## 2021-07-22 NOTE — Assessment & Plan Note (Signed)
Takes trazodone 50 mg nightly

## 2021-08-18 DIAGNOSIS — N182 Chronic kidney disease, stage 2 (mild): Secondary | ICD-10-CM | POA: Diagnosis not present

## 2021-08-18 DIAGNOSIS — D509 Iron deficiency anemia, unspecified: Secondary | ICD-10-CM | POA: Diagnosis not present

## 2021-08-18 DIAGNOSIS — E1165 Type 2 diabetes mellitus with hyperglycemia: Secondary | ICD-10-CM | POA: Diagnosis not present

## 2021-08-18 DIAGNOSIS — E1122 Type 2 diabetes mellitus with diabetic chronic kidney disease: Secondary | ICD-10-CM | POA: Diagnosis not present

## 2021-08-18 DIAGNOSIS — Z794 Long term (current) use of insulin: Secondary | ICD-10-CM | POA: Diagnosis not present

## 2021-08-19 LAB — COMPREHENSIVE METABOLIC PANEL
ALT: 9 IU/L (ref 0–44)
AST: 12 IU/L (ref 0–40)
Albumin/Globulin Ratio: 1.4 (ref 1.2–2.2)
Albumin: 4.5 g/dL (ref 3.8–4.8)
Alkaline Phosphatase: 101 IU/L (ref 44–121)
BUN/Creatinine Ratio: 9 — ABNORMAL LOW (ref 10–24)
BUN: 12 mg/dL (ref 8–27)
Bilirubin Total: 0.6 mg/dL (ref 0.0–1.2)
CO2: 24 mmol/L (ref 20–29)
Calcium: 9.6 mg/dL (ref 8.6–10.2)
Chloride: 100 mmol/L (ref 96–106)
Creatinine, Ser: 1.33 mg/dL — ABNORMAL HIGH (ref 0.76–1.27)
Globulin, Total: 3.2 g/dL (ref 1.5–4.5)
Glucose: 55 mg/dL — ABNORMAL LOW (ref 70–99)
Potassium: 3.7 mmol/L (ref 3.5–5.2)
Sodium: 139 mmol/L (ref 134–144)
Total Protein: 7.7 g/dL (ref 6.0–8.5)
eGFR: 58 mL/min/{1.73_m2} — ABNORMAL LOW (ref >59)

## 2021-08-19 LAB — CBC WITH DIFFERENTIAL/PLATELET
Basophils Absolute: 0 10*3/uL (ref 0.0–0.2)
Basos: 1 %
EOS (ABSOLUTE): 0.1 10*3/uL (ref 0.0–0.4)
Eos: 1 %
Hematocrit: 40.2 % (ref 37.5–51.0)
Hemoglobin: 14.1 g/dL (ref 13.0–17.7)
Immature Grans (Abs): 0 10*3/uL (ref 0.0–0.1)
Immature Granulocytes: 0 %
Lymphocytes Absolute: 2.2 10*3/uL (ref 0.7–3.1)
Lymphs: 34 %
MCH: 32.7 pg (ref 26.6–33.0)
MCHC: 35.1 g/dL (ref 31.5–35.7)
MCV: 93 fL (ref 79–97)
Monocytes Absolute: 0.5 10*3/uL (ref 0.1–0.9)
Monocytes: 8 %
Neutrophils Absolute: 3.6 10*3/uL (ref 1.4–7.0)
Neutrophils: 56 %
Platelets: 222 10*3/uL (ref 150–450)
RBC: 4.31 x10E6/uL (ref 4.14–5.80)
RDW: 11.9 % (ref 11.6–15.4)
WBC: 6.4 10*3/uL (ref 3.4–10.8)

## 2021-08-26 ENCOUNTER — Ambulatory Visit (INDEPENDENT_AMBULATORY_CARE_PROVIDER_SITE_OTHER): Payer: Medicare Other | Admitting: "Endocrinology

## 2021-08-26 ENCOUNTER — Other Ambulatory Visit: Payer: Self-pay

## 2021-08-26 ENCOUNTER — Encounter: Payer: Self-pay | Admitting: "Endocrinology

## 2021-08-26 VITALS — BP 128/76 | HR 60 | Ht 67.5 in | Wt 133.2 lb

## 2021-08-26 DIAGNOSIS — E782 Mixed hyperlipidemia: Secondary | ICD-10-CM | POA: Diagnosis not present

## 2021-08-26 DIAGNOSIS — Z794 Long term (current) use of insulin: Secondary | ICD-10-CM

## 2021-08-26 DIAGNOSIS — E1169 Type 2 diabetes mellitus with other specified complication: Secondary | ICD-10-CM | POA: Diagnosis not present

## 2021-08-26 DIAGNOSIS — F17200 Nicotine dependence, unspecified, uncomplicated: Secondary | ICD-10-CM | POA: Diagnosis not present

## 2021-08-26 DIAGNOSIS — I10 Essential (primary) hypertension: Secondary | ICD-10-CM

## 2021-08-26 DIAGNOSIS — I2510 Atherosclerotic heart disease of native coronary artery without angina pectoris: Secondary | ICD-10-CM | POA: Diagnosis not present

## 2021-08-26 LAB — POCT GLYCOSYLATED HEMOGLOBIN (HGB A1C): HbA1c, POC (controlled diabetic range): 6.5 % (ref 0.0–7.0)

## 2021-08-26 NOTE — Progress Notes (Signed)
08/26/2021   Endocrinology follow-up note    Subjective:    Patient ID: Chase Briggs, male    DOB: Oct 05, 1950, PCP Lindell Spar, MD   Past Medical History:  Diagnosis Date   Diabetes mellitus    Fatty liver    GERD (gastroesophageal reflux disease)    HTN (hypertension)    Hyperlipidemia    Insomnia    Past Surgical History:  Procedure Laterality Date   ABDOMINAL AORTOGRAM W/LOWER EXTREMITY N/A 08/22/2018   Procedure: ABDOMINAL AORTOGRAM W/LOWER EXTREMITY;  Surgeon: Waynetta Sandy, MD;  Location: [facility];  Service: Cardiovascular;  Laterality: N/A;   BACK SURGERY     CARPAL TUNNEL WITH CUBITAL TUNNEL Right 03/21/2019   Procedure: RIGHT CARPAL TUNNEL AND CUBITAL  TUNNEL RELEASES;  Surgeon: Daryll Brod, MD;  Location: [facility];  Service: Orthopedics;  Laterality: Right;  AXILLARY BLOCK   COLONOSCOPY  01/2007   Dr. Arnoldo Morale, reviewed report, mild diverticulosis. Prep adequate. Next TCS due 01/2017   ESOPHAGOGASTRODUODENOSCOPY N/A 10/21/2012   [id number] GASTRITIS   MASS EXCISION Right 10/26/2012   Procedure: EXCISION MASS;  Surgeon: Jamesetta So, MD;  Location: [facility];  Service: General;  Laterality: Right;   PERIPHERAL VASCULAR INTERVENTION  08/22/2018   Procedure: PERIPHERAL VASCULAR INTERVENTION;  Surgeon: Waynetta Sandy, MD;  Location: [facility];  Service: Cardiovascular;;  bilateral external iliac stents   scalp mass  4-5 yrs ago   Dr Caralyn Guile NERVE TRANSPOSITION Right 03/21/2019   Procedure: DECOMPRESSION RIGHT ULNAR NERVE;  Surgeon: Daryll Brod, MD;  Location: [facility];  Service: Orthopedics;  Laterality: Right;   Social History   Socioeconomic History   Marital status: Married    Spouse name: Not on file   Number of children: 2   Years of education: Not on file   Highest education level: Not on file  Occupational History   Occupation: reitred    Comment: [employer], heavy lifting   Tobacco Use   Smoking status: Every Day    Packs/day: 1.00    Years: 35.00    Pack years: 35.00    Types: Cigarettes   Smokeless tobacco: Never  Vaping Use   Vaping Use: Never used  Substance and Sexual Activity   Alcohol use: No    Comment: hx heavy etoh x 3 yrs, quit 30+ yrs ago   Drug use: No   Sexual activity: Yes    Birth control/protection: None  Other Topics Concern   Not on file  Social History Narrative   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 Married for 12 years. Has 2 sons (21 and 12). Lives with wife.   Social Determinants of Health   Financial Resource Strain: Not on file  Food Insecurity: Not on file  Transportation Needs: Not on file  Physical Activity: Not on file  Stress: Not on file  Social Connections: Not on file   Outpatient Encounter Medications as of 08/26/2021  Medication Sig   amLODipine (NORVASC) 10 MG tablet Take 1 tablet (10 mg total) by mouth daily.   aspirin EC 81 MG tablet Take 81 mg by mouth daily.   atorvastatin (LIPITOR) 10 MG tablet Take 1 tablet (10 mg total) by mouth daily.   Cholecalciferol (VITAMIN D) 125 MCG (5000 UT) CAPS Take 2,000 Units by mouth daily.   cloNIDine (CATAPRES) 0.3 MG tablet Take 1 tablet (0.3 mg total) by mouth daily.  clopidogrel (PLAVIX) 75 MG tablet Take 1 tablet (75 mg total) by mouth daily.   Ferrous Sulfate (IRON) 325 (65 Fe) MG TABS Take 325 mg by mouth daily.   fluticasone (FLONASE) 50 MCG/ACT nasal spray Place 1 spray into both nostrils daily.   glucose blood test strip 1 each by Other route 2 (two) times daily. Use as instructed bid E11.65 Relion Premier   Insulin Pen Needle (SURE COMFORT PEN NEEDLES) 32G X 4 MM MISC USE ONCE DAILY   Lancets MISC 1 each by Does not apply route 2 (two) times daily. E11.65 Relion Premier   LANTUS SOLOSTAR 100 UNIT/ML Solostar Pen INJECT 14 UNITS UNDER THE SKIN AT BEDTIME (Patient taking differently: daily.)    lisinopril-hydrochlorothiazide (ZESTORETIC) 20-25 MG tablet Take 1 tablet by mouth daily.   nystatin-triamcinolone (MYCOLOG II) cream Apply topically 2 (two) times daily. (Patient not taking: Reported on 08/26/2021)   SSD 1 % cream Apply topically daily.   traZODone (DESYREL) 50 MG tablet Take 1 tablet (50 mg total) by mouth at bedtime.   No facility-administered encounter medications on file as of 08/26/2021.   ALLERGIES: No Known Allergies VACCINATION STATUS: Immunization History  Administered Date(s) Administered   Moderna Sars-Covid-2 Vaccination 01/04/2020, 02/06/2020    Diabetes He presents for his follow-up diabetic visit. He has type 2 diabetes mellitus. Onset time: He was diagnosed at approximate age of 89 years. His disease course has been stable. There are no hypoglycemic associated symptoms. Pertinent negatives for hypoglycemia include no confusion, headaches, pallor or seizures. Pertinent negatives for diabetes include no chest pain, no fatigue, no polydipsia, no polyphagia, no polyuria and no weakness. There are no hypoglycemic complications. Symptoms are stable (He recently lost control of his glycemia due to exposure to heavy dose steroids.). Risk factors for coronary artery disease include diabetes mellitus, dyslipidemia, hypertension, male sex, sedentary lifestyle and tobacco exposure. He is compliant with treatment most of the time. His weight is fluctuating minimally. He is following a generally unhealthy diet. When asked about meal planning, he reported none. He has not had a previous visit with a dietitian. His home blood glucose trend is fluctuating minimally. His breakfast blood glucose range is generally 110-130 mg/dl. His bedtime blood glucose range is generally 140-180 mg/dl. His overall blood glucose range is 140-180 mg/dl. Chase Briggs presents with controlled glycemic profile both fasting and postprandial.  His point-of-care A1c is 6.5%, unchanged from last time.  No major  hypoglycemia documented or reported.  ) An ACE inhibitor/angiotensin II receptor blocker is being taken. Eye exam is current.  Hypertension This is a chronic problem. The current episode started more than 1 year ago. The problem has been gradually improving since onset. The problem is uncontrolled. Pertinent negatives include no chest pain, headaches, neck pain, palpitations or shortness of breath. Risk factors for coronary artery disease include smoking/tobacco exposure, sedentary lifestyle, dyslipidemia, family history and diabetes mellitus. Past treatments include ACE inhibitors, calcium channel blockers and central alpha agonists. The current treatment provides no improvement. Compliance problems include psychosocial issues.  Hypertensive end-organ damage includes kidney disease. Identifiable causes of hypertension include chronic renal disease.  Hyperlipidemia This is a chronic problem. The current episode started more than 1 year ago. Exacerbating diseases include chronic renal disease and diabetes. Pertinent negatives include no chest pain, myalgias or shortness of breath. Current antihyperlipidemic treatment includes statins. Compliance problems include psychosocial issues.  Risk factors for coronary artery disease include dyslipidemia, diabetes mellitus, male sex, hypertension and a sedentary lifestyle.  Review of Systems  Constitutional:  Negative for fatigue and unexpected weight change.  HENT:  Negative for dental problem, mouth sores and trouble swallowing.   Eyes:  Negative for visual disturbance.  Respiratory:  Negative for cough, choking, chest tightness, shortness of breath and wheezing.   Cardiovascular:  Negative for chest pain, palpitations and leg swelling.  Gastrointestinal:  Negative for abdominal distention, abdominal pain, constipation, diarrhea, nausea and vomiting.  Endocrine: Negative for polydipsia, polyphagia and polyuria.  Genitourinary:  Negative for dysuria, flank  pain, hematuria and urgency.  Musculoskeletal:  Negative for back pain, gait problem, myalgias and neck pain.  Skin:  Negative for pallor, rash and wound.  Neurological:  Negative for seizures, syncope, weakness, numbness and headaches.  Psychiatric/Behavioral: Negative.  Negative for confusion and dysphoric mood.    Objective:    BP 128/76    Pulse 60    Ht 5' 7.5" (1.715 m)    Wt 133 lb 3.2 oz (60.4 kg)    BMI 20.55 kg/m   Wt Readings from Last 3 Encounters:  08/26/21 133 lb 3.2 oz (60.4 kg)  07/22/21 134 lb 0.6 oz (60.8 kg)  04/17/21 130 lb (59 kg)     Physical Exam- Limited  Constitutional:  Body mass index is 20.55 kg/m. , not in acute distress, normal state of mind  Reluctant affect, not ready to quit smoking  Complete Blood Count (Most recent): Lab Results  Component Value Date   WBC 6.4 08/18/2021   HGB 14.1 08/18/2021   HCT 40.2 08/18/2021   MCV 93 08/18/2021   PLT 222 08/18/2021   Chemistry (most recent): Lab Results  Component Value Date   NA 139 08/18/2021   K 3.7 08/18/2021   CL 100 08/18/2021   CO2 24 08/18/2021   BUN 12 08/18/2021   CREATININE 1.33 (H) 08/18/2021   Diabetic Labs (most recent): Lab Results  Component Value Date   HGBA1C 6.5 08/26/2021   HGBA1C 6.5 02/21/2021   HGBA1C 7.5 (A) 08/23/2020   Lipid Panel     Component Value Date/Time   CHOL 103 02/11/2021 0808   TRIG 42 02/11/2021 0808   HDL 48 02/11/2021 0808   CHOLHDL 2.1 02/11/2021 0808   CHOLHDL 2.6 12/12/2019 0905   LDLCALC 44 02/11/2021 0808   LDLCALC 56 12/12/2019 0905     Assessment & Plan:   1. Type 2 diabetes mellitus with microalbuminuria , with long-term current use of insulin (HCC) Garret presents with controlled glycemic profile both fasting and postprandial.  His point-of-care A1c is 6.5%, unchanged from last time.  No major hypoglycemia documented or reported.    He does not have any hypoglycemic episodes.  He is a chronic heavy smoker, remains at a high  risk for more acute and chronic complications of diabetes which include CAD, CVA, CKD, retinopathy, and neuropathy. These are all discussed in detail with the patient.    Recent labs reviewed, stage 3 renal insufficiency. - I have re-counseled the patient on diet management  by adopting a carbohydrate restricted / protein rich  Diet.  - he acknowledges that there is a room for improvement in his food and drink choices. - Suggestion is made for him to avoid simple carbohydrates  from his diet including Cakes, Sweet Desserts, Ice Cream, Soda (diet and regular), Sweet Tea, Candies, Chips, Cookies, Store Bought Juices, Alcohol in Excess of  1-2 drinks a day, Artificial Sweeteners,  Coffee Creamer, and "Sugar-free" Products, Lemonade. This will help patient to have more  stable blood glucose profile and potentially avoid unintended weight gain.  - Patient is advised to stick to a routine mealtimes to eat 3 meals  a day and avoid unnecessary snacks ( to snack only to correct hypoglycemia).  - I have approached patient with the following individualized plan to manage diabetes and patient agrees.  - He has benefited from insulin initiation.   - Based on  his presentation with target glycemic profile, he will not need prandial insulin for now.  He is advised to continue Lantus 14 units daily at breakfast.     -He is encouraged and agrees to to continue monitoring blood glucose at least twice a day-daily before breakfast and at bedtime.    - He will call clinic if he registers blood glucose readings less than 70 or greater than 200 mg/dL.  -   He has stage 3 renal insufficiency which has improved over time, will stay off of metformin for now.   If his renal insufficiency continues, he will need consultation with nephrology.    - Patient is not a candidate for  incretin therapy, nor SGLT2 inhibitors. - Patient specific target  for A1c; LDL, HDL, Triglycerides, were discussed in detail.  2) BP/HTN:   -His blood pressure is controlled to target.   He has 3 medications including losartan, clonidine, and amlodipine.  He continues to smoke heavily.  He did not take his blood pressure medications this morning.  He has responded to the addition of clonidine. The patient was counseled on the dangers of tobacco use, and was advised to quit.  Reviewed strategies to maximize success, including removing cigarettes and smoking materials from environment.   3) Lipids/HPL: His most recent lipid panel showed LDL controlled at 56.  He is not on statins for now.    4)  Weight/Diet: His BMI is 2 20.5-not a candidate for weight loss.     He has maintained his gain of 20 pounds of weight.   - He would benefit from addition of more complex carbohydrates, information provided. CDE consult is requested.  5. Vitamin D deficiency  - He he is on ongoing supplement with vitamin D3 5000 units daily.    6) Chronic Care/Health Maintenance:  -Patient is on ACEI/ARB and Statin medications and encouraged to continue to follow up with Ophthalmology, Podiatrist at least yearly or according to recommendations, and advised to  Quit  smoking. I have recommended yearly flu vaccine and pneumonia vaccination at least every 5 years; moderate intensity exercise for up to 150 minutes weekly; and  sleep for at least 7 hours a day. -He is counseled extensively for smoking cessation.  - I advised patient to maintain close follow up with Lindell Spar, MD for primary care needs. - I have advised him to avoid over-the-counter pain medications to avoid additional risk for renal insufficiency.    I spent 32 minutes in the care of the patient today including review of labs from [facility], Lipids, Thyroid Function, Hematology (current and previous including abstractions from other facilities); face-to-face time discussing  his blood glucose readings/logs, discussing hypoglycemia and hyperglycemia episodes and symptoms, medications doses, his  options of short and long term treatment based on the latest standards of care / guidelines;  discussion about incorporating lifestyle medicine;  and documenting the encounter.    Please refer to Patient Instructions for Blood Glucose Monitoring and Insulin/Medications Dosing Guide"  in media tab for additional information. Please  also refer to " Patient Self Inventory"  in the Media  tab for reviewed elements of pertinent patient history.  Chase Briggs participated in the discussions, expressed understanding, and voiced agreement with the above plans.  All questions were answered to his satisfaction. he is encouraged to contact clinic should he have any questions or concerns prior to his return visit.    Follow up plan: -Return in about 6 months (around 02/24/2022) for Bring Meter and Logs- A1c in Office.  Glade Lloyd, MD Phone: [phone]  Fax: [phone]  This note was partially dictated with voice recognition software. Similar sounding words can be transcribed inadequately or may not  be corrected upon review.  08/26/2021, 12:21 PM

## 2021-08-27 ENCOUNTER — Other Ambulatory Visit: Payer: Self-pay | Admitting: Internal Medicine

## 2021-08-27 ENCOUNTER — Ambulatory Visit (INDEPENDENT_AMBULATORY_CARE_PROVIDER_SITE_OTHER): Payer: Medicare Other

## 2021-08-27 ENCOUNTER — Telehealth: Payer: Self-pay

## 2021-08-27 DIAGNOSIS — Z23 Encounter for immunization: Secondary | ICD-10-CM

## 2021-08-27 DIAGNOSIS — I10 Essential (primary) hypertension: Secondary | ICD-10-CM

## 2021-08-27 MED ORDER — CLONIDINE HCL 0.3 MG PO TABS: 0.3000 mg | ORAL_TABLET | Freq: Every day | ORAL | 0 refills | Status: DC

## 2021-08-27 NOTE — Telephone Encounter (Signed)
Patient need med refill Clonidine 0.3 mg, no longer gets from [facility]). Pharmacy:  [facility]

## 2021-09-09 ENCOUNTER — Telehealth (HOSPITAL_COMMUNITY): Payer: Self-pay | Admitting: Emergency Medicine

## 2021-09-09 NOTE — Telephone Encounter (Signed)
Received call from [facility] regarding refill request. Advised representative that PCP would need to be contacted. UC does not refill medications without visit.

## 2021-09-10 ENCOUNTER — Other Ambulatory Visit: Payer: Self-pay | Admitting: Internal Medicine

## 2021-09-10 ENCOUNTER — Telehealth: Payer: Self-pay | Admitting: Internal Medicine

## 2021-09-10 DIAGNOSIS — G4700 Insomnia, unspecified: Secondary | ICD-10-CM

## 2021-09-10 MED ORDER — TRAZODONE HCL 50 MG PO TABS: 50.0000 mg | ORAL_TABLET | Freq: Every day | ORAL | 1 refills | Status: DC

## 2021-09-10 NOTE — Telephone Encounter (Signed)
Pt needs refill on TRAZODONE 50MG      [facility]

## 2021-09-11 ENCOUNTER — Other Ambulatory Visit: Payer: Self-pay | Admitting: *Deleted

## 2021-09-11 DIAGNOSIS — I10 Essential (primary) hypertension: Secondary | ICD-10-CM

## 2021-09-11 MED ORDER — LISINOPRIL-HYDROCHLOROTHIAZIDE 20-25 MG PO TABS: 1.0000 | ORAL_TABLET | Freq: Every day | ORAL | 0 refills | Status: DC

## 2021-09-23 ENCOUNTER — Encounter: Payer: Self-pay | Admitting: Internal Medicine

## 2021-09-23 ENCOUNTER — Other Ambulatory Visit: Payer: Self-pay

## 2021-09-23 ENCOUNTER — Ambulatory Visit (INDEPENDENT_AMBULATORY_CARE_PROVIDER_SITE_OTHER): Payer: Medicare Other | Admitting: Internal Medicine

## 2021-09-23 VITALS — BP 136/82 | HR 64 | Resp 18 | Ht 67.0 in | Wt 130.0 lb

## 2021-09-23 DIAGNOSIS — R1033 Periumbilical pain: Secondary | ICD-10-CM

## 2021-09-23 DIAGNOSIS — E119 Type 2 diabetes mellitus without complications: Secondary | ICD-10-CM | POA: Diagnosis not present

## 2021-09-23 DIAGNOSIS — K5900 Constipation, unspecified: Secondary | ICD-10-CM

## 2021-09-23 LAB — HM DIABETES EYE EXAM

## 2021-09-23 MED ORDER — DOCUSATE SODIUM 100 MG PO CAPS: 100.0000 mg | ORAL_CAPSULE | Freq: Two times a day (BID) | ORAL | 0 refills | Status: DC | PRN

## 2021-09-23 NOTE — Progress Notes (Signed)
Acute Office Visit  Subjective:    Patient ID: Chase Briggs, male    DOB: 19-Sep-1950, 71 y.o.   MRN: [id number]  Chief Complaint  Patient presents with   Acute Visit    Pt having tingling sensation around belly button since 09-15-21 only feels when he touches the skin     HPI Patient is in today for complaint of abdominal pain/tenderness on the left side of his umbilicus.  He denies noticing any bump in that area.  He uses insulin on his right side of the abdomen currently.  He denies any redness or warmth on the left side of his umbilicus currently.  He denies any fever, chills, nausea, vomiting, diarrhea, melena or hematochezia currently.  He does have chronic constipation.  Past Medical History:  Diagnosis Date   Diabetes mellitus    Fatty liver    GERD (gastroesophageal reflux disease)    HTN (hypertension)    Hyperlipidemia    Insomnia     Past Surgical History:  Procedure Laterality Date   ABDOMINAL AORTOGRAM W/LOWER EXTREMITY N/A 08/22/2018   Procedure: ABDOMINAL AORTOGRAM W/LOWER EXTREMITY;  Surgeon: Waynetta Sandy, MD;  Location: [facility];  Service: Cardiovascular;  Laterality: N/A;   BACK SURGERY     CARPAL TUNNEL WITH CUBITAL TUNNEL Right 03/21/2019   Procedure: RIGHT CARPAL TUNNEL AND CUBITAL  TUNNEL RELEASES;  Surgeon: Daryll Brod, MD;  Location: [facility];  Service: Orthopedics;  Laterality: Right;  AXILLARY BLOCK   COLONOSCOPY  01/2007   Dr. Arnoldo Morale, reviewed report, mild diverticulosis. Prep adequate. Next TCS due 01/2017   ESOPHAGOGASTRODUODENOSCOPY N/A 10/21/2012   [id number] GASTRITIS   MASS EXCISION Right 10/26/2012   Procedure: EXCISION MASS;  Surgeon: Jamesetta So, MD;  Location: [facility];  Service: General;  Laterality: Right;   PERIPHERAL VASCULAR INTERVENTION  08/22/2018   Procedure: PERIPHERAL VASCULAR INTERVENTION;  Surgeon: Waynetta Sandy, MD;  Location: [facility];  Service: Cardiovascular;;   bilateral external iliac stents   scalp mass  4-5 yrs ago   Dr Caralyn Guile NERVE TRANSPOSITION Right 03/21/2019   Procedure: DECOMPRESSION RIGHT ULNAR NERVE;  Surgeon: Daryll Brod, MD;  Location: [facility];  Service: Orthopedics;  Laterality: Right;    Family History  Problem Relation Age of Onset   Diabetes Mother    Diabetes Father    Diabetes Brother    Colon cancer Neg Hx    Liver disease Neg Hx     Social History   Socioeconomic History   Marital status: Married    Spouse name: Not on file   Number of children: 2   Years of education: Not on file   Highest education level: Not on file  Occupational History   Occupation: reitred    Comment: [employer], heavy lifting  Tobacco Use   Smoking status: Every Day    Packs/day: 1.00    Years: 35.00    Pack years: 35.00    Types: Cigarettes   Smokeless tobacco: Never  Vaping Use   Vaping Use: Never used  Substance and Sexual Activity   Alcohol use: No    Comment: hx heavy etoh x 3 yrs, quit 30+ yrs ago   Drug use: No   Sexual activity: Yes    Birth control/protection: None  Other Topics Concern   Not on file  Social History Narrative   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  Married for 12 years. Has 2 sons (92 and 104). Lives with wife.   Social Determinants of Health   Financial Resource Strain: Not on file  Food Insecurity: Not on file  Transportation Needs: Not on file  Physical Activity: Not on file  Stress: Not on file  Social Connections: Not on file  Intimate Partner Violence: Not on file    Outpatient Medications Prior to Visit  Medication Sig Dispense Refill   amLODipine (NORVASC) 10 MG tablet Take 1 tablet (10 mg total) by mouth daily. 90 tablet 1   aspirin EC 81 MG tablet Take 81 mg by mouth daily.     Cholecalciferol (VITAMIN D) 125 MCG (5000 UT) CAPS Take 2,000 Units by mouth daily.     cloNIDine (CATAPRES) 0.3 MG tablet Take 1 tablet (0.3 mg  total) by mouth daily. 90 tablet 0   clopidogrel (PLAVIX) 75 MG tablet Take 1 tablet (75 mg total) by mouth daily. 90 tablet 0   Ferrous Sulfate (IRON) 325 (65 Fe) MG TABS Take 325 mg by mouth daily.     fluticasone (FLONASE) 50 MCG/ACT nasal spray Place 1 spray into both nostrils daily. 16 g 1   glucose blood test strip 1 each by Other route 2 (two) times daily. Use as instructed bid E11.65 Relion Premier 100 each 5   Insulin Pen Needle (SURE COMFORT PEN NEEDLES) 32G X 4 MM MISC USE ONCE DAILY 100 each 2   Lancets MISC 1 each by Does not apply route 2 (two) times daily. E11.65 Relion Premier 100 each 5   LANTUS SOLOSTAR 100 UNIT/ML Solostar Pen INJECT 14 UNITS UNDER THE SKIN AT BEDTIME (Patient taking differently: daily.) 15 mL 3   lisinopril-hydrochlorothiazide (ZESTORETIC) 20-25 MG tablet Take 1 tablet by mouth daily. 90 tablet 0   nystatin-triamcinolone (MYCOLOG II) cream Apply topically 2 (two) times daily. 30 g 3   SSD 1 % cream Apply topically daily.     traZODone (DESYREL) 50 MG tablet Take 1 tablet (50 mg total) by mouth at bedtime. 90 tablet 1   atorvastatin (LIPITOR) 10 MG tablet Take 1 tablet (10 mg total) by mouth daily. 90 tablet 1   No facility-administered medications prior to visit.    No Known Allergies  Review of Systems  Constitutional:  Negative for chills and fever.  Respiratory:  Negative for cough and shortness of breath.   Cardiovascular:  Negative for chest pain and palpitations.  Gastrointestinal:  Positive for constipation. Negative for diarrhea and vomiting.  Genitourinary:  Negative for dysuria and hematuria.  Musculoskeletal:  Negative for neck pain and neck stiffness.  Skin:  Negative for rash.  Neurological:  Negative for dizziness and weakness.  Psychiatric/Behavioral:  Negative for agitation and behavioral problems.       Objective:    Physical Exam Vitals reviewed.  Constitutional:      General: He is not in acute distress.    Appearance: He is  not diaphoretic.  HENT:     Head: Normocephalic and atraumatic.     Nose: Nose normal.     Mouth/Throat:     Mouth: Mucous membranes are moist.  Eyes:     General: No scleral icterus.    Extraocular Movements: Extraocular movements intact.  Cardiovascular:     Rate and Rhythm: Normal rate and regular rhythm.     Pulses: Normal pulses.     Heart sounds: Normal heart sounds. No murmur heard. Pulmonary:     Breath sounds: Normal breath sounds.  No wheezing or rales.  Abdominal:     Palpations: Abdomen is soft.     Tenderness: There is abdominal tenderness (Left periumbilical area, no swelling or erythema).  Musculoskeletal:     Cervical back: Neck supple. No tenderness.     Right lower leg: No edema.     Left lower leg: No edema.  Skin:    General: Skin is warm.     Findings: No rash.  Neurological:     General: No focal deficit present.     Mental Status: He is alert and oriented to person, place, and time.  Psychiatric:        Mood and Affect: Mood normal.        Behavior: Behavior normal.    BP 136/82 (BP Location: Right Arm, Patient Position: Sitting, Cuff Size: Normal)    Pulse 64    Resp 18    Ht 5\' 7"  (1.702 m)    Wt 130 lb 0.6 oz (59 kg)    SpO2 99%    BMI 20.37 kg/m  Wt Readings from Last 3 Encounters:  09/23/21 130 lb 0.6 oz (59 kg)  08/26/21 133 lb 3.2 oz (60.4 kg)  07/22/21 134 lb 0.6 oz (60.8 kg)        Assessment & Plan:   Problem List Items Addressed This Visit       Other   Acute periumbilical pain - Primary    Unclear etiology currently Will check [place] of abdomen Since he uses insulin SQ in the abdominal area, would like to rule out local skin infection and/or intra abdominal abscess      Relevant Orders   US Abdomen Limited   Other Visit Diagnoses     Constipation, unspecified constipation type     Advised to maintain adequate hydration Colace as needed for now   Relevant Medications   docusate sodium (COLACE) 100 MG capsule         Meds ordered this encounter  Medications   docusate sodium (COLACE) 100 MG capsule    Sig: Take 1 capsule (100 mg total) by mouth 2 (two) times daily as needed for mild constipation.    Dispense:  60 capsule    Refill:  0     Syreeta Figler Keith Rake, MD

## 2021-09-23 NOTE — Assessment & Plan Note (Signed)
Unclear etiology currently Will check [place] of abdomen Since he uses insulin SQ in the abdominal area, would like to rule out local skin infection and/or intra abdominal abscess

## 2021-09-23 NOTE — Patient Instructions (Signed)
You are being scheduled to get US abdomen.  Please take Colace for constipation.

## 2021-09-24 ENCOUNTER — Other Ambulatory Visit: Payer: Self-pay

## 2021-09-24 DIAGNOSIS — I739 Peripheral vascular disease, unspecified: Secondary | ICD-10-CM

## 2021-09-24 DIAGNOSIS — I771 Stricture of artery: Secondary | ICD-10-CM

## 2021-09-25 ENCOUNTER — Encounter: Payer: Self-pay | Admitting: "Endocrinology

## 2021-09-29 ENCOUNTER — Other Ambulatory Visit: Payer: Self-pay | Admitting: *Deleted

## 2021-09-29 DIAGNOSIS — I10 Essential (primary) hypertension: Secondary | ICD-10-CM

## 2021-09-29 DIAGNOSIS — E1151 Type 2 diabetes mellitus with diabetic peripheral angiopathy without gangrene: Secondary | ICD-10-CM | POA: Diagnosis not present

## 2021-09-29 DIAGNOSIS — L603 Nail dystrophy: Secondary | ICD-10-CM | POA: Diagnosis not present

## 2021-09-29 DIAGNOSIS — L851 Acquired keratosis [keratoderma] palmaris et plantaris: Secondary | ICD-10-CM | POA: Diagnosis not present

## 2021-09-29 DIAGNOSIS — M79674 Pain in right toe(s): Secondary | ICD-10-CM | POA: Diagnosis not present

## 2021-09-29 MED ORDER — LISINOPRIL-HYDROCHLOROTHIAZIDE 20-25 MG PO TABS: 1.0000 | ORAL_TABLET | Freq: Every day | ORAL | 0 refills | Status: DC

## 2021-09-30 ENCOUNTER — Encounter: Payer: Self-pay | Admitting: *Deleted

## 2021-10-07 ENCOUNTER — Other Ambulatory Visit: Payer: Self-pay

## 2021-10-07 ENCOUNTER — Ambulatory Visit (HOSPITAL_COMMUNITY)
Admission: RE | Admit: 2021-10-07 | Discharge: 2021-10-07 | Disposition: A | Discharge status: Home / Self Care | Payer: Medicare Other | Source: Ambulatory Visit | Attending: Internal Medicine | Admitting: Internal Medicine

## 2021-10-07 DIAGNOSIS — R1033 Periumbilical pain: Secondary | ICD-10-CM | POA: Insufficient documentation

## 2021-10-07 DIAGNOSIS — R109 Unspecified abdominal pain: Secondary | ICD-10-CM | POA: Diagnosis not present

## 2021-10-13 ENCOUNTER — Ambulatory Visit (INDEPENDENT_AMBULATORY_CARE_PROVIDER_SITE_OTHER)
Admission: RE | Admit: 2021-10-13 | Discharge: 2021-10-13 | Disposition: A | Discharge status: Home / Self Care | Payer: Medicare Other | Source: Ambulatory Visit | Attending: Surgery | Admitting: Surgery

## 2021-10-13 ENCOUNTER — Ambulatory Visit (INDEPENDENT_AMBULATORY_CARE_PROVIDER_SITE_OTHER): Payer: Medicare Other | Admitting: Physician Assistant

## 2021-10-13 ENCOUNTER — Ambulatory Visit (HOSPITAL_COMMUNITY)
Admission: RE | Admit: 2021-10-13 | Discharge: 2021-10-13 | Disposition: A | Discharge status: Home / Self Care | Payer: Medicare Other | Source: Ambulatory Visit | Attending: Surgery | Admitting: Surgery

## 2021-10-13 ENCOUNTER — Other Ambulatory Visit: Payer: Self-pay

## 2021-10-13 VITALS — BP 130/72 | HR 56 | Temp 97.5°F | Resp 16 | Ht 67.0 in | Wt 128.0 lb

## 2021-10-13 DIAGNOSIS — I739 Peripheral vascular disease, unspecified: Secondary | ICD-10-CM | POA: Diagnosis not present

## 2021-10-13 DIAGNOSIS — I771 Stricture of artery: Secondary | ICD-10-CM

## 2021-10-13 NOTE — Progress Notes (Signed)
[facility] [place] HISTORY AND PHYSICAL   History of Present Illness:  Patient is a 71 y.o. 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  S/P  B Iliac stent placement by Dr. [name] 08/22/18.  He denies rest pain, non healing ulcers and claudication.  He states he really doesn't do that much activity other than walk to his mail box once a day about 500 feet.     He continue to smoke daily.  We discussed the importance of smoking cessation.  He takes daily Statin, Plavix and ASA.     Past Medical History:  Diagnosis Date   Diabetes mellitus    Fatty liver    GERD (gastroesophageal reflux disease)    HTN (hypertension)    Hyperlipidemia    Insomnia     Past Surgical History:  Procedure Laterality Date   ABDOMINAL AORTOGRAM W/LOWER EXTREMITY N/A 08/22/2018   Procedure: ABDOMINAL AORTOGRAM W/LOWER EXTREMITY;  Surgeon: [name], MD;  Location: [facility] IN[facility];  Service: Cardiovascular;  Laterality: N/A;   BACK SURGERY     CARPAL TUNNEL WITH CUBITAL TUNNEL Right 03/21/2019   Procedure: RIGHT CARPAL TUNNEL AND CUBITAL  TUNNEL RELEASES;  Surgeon: [name], MD;  Location: [facility];  Service: Orthopedics;  Laterality: Right;  AXILLARY BLOCK   COLONOSCOPY  01/2007   Dr. [name], reviewed report, mild diverticulosis. Prep adequate. Next TCS due 01/2017   ESOPHAGOGASTRODUODENOSCOPY N/A 10/21/2012   KCL:EXNTZGYF GASTRITIS   MASS EXCISION Right 10/26/2012   Procedure: EXCISION MASS;  Surgeon: [name], MD;  Location: [facility];  Service: General;  Laterality: Right;   PERIPHERAL VASCULAR INTERVENTION  08/22/2018   Procedure: PERIPHERAL VASCULAR INTERVENTION;  Surgeon: [name], MD;  Location: [facility] INVASIVE CV LAB;  Service: Cardiovascular;;  bilateral external iliac stents   scalp mass  4-5 yrs ago   Dr Angelique Holm NERVE TRANSPOSITION Right 03/21/2019   Procedure:  DECOMPRESSION RIGHT ULNAR NERVE;  Surgeon: [name], MD;  Location: [facility];  Service: Orthopedics;  Laterality: Right;    ROS:   General:  No weight loss, Fever, chills  HEENT: No recent headaches, no nasal bleeding, no visual changes, no sore throat  Neurologic: No dizziness, blackouts, seizures. No recent symptoms of stroke or mini- stroke. No recent episodes of slurred speech, or temporary blindness.  Cardiac: No recent episodes of chest pain/pressure, no shortness of breath at rest.  No shortness of breath with exertion.  Denies history of atrial fibrillation or irregular heartbeat  Vascular: No history of rest pain in feet.  No history of claudication.  No history of non-healing ulcer, No history of DVT   Pulmonary: No home oxygen, no productive cough, no hemoptysis,  No asthma or wheezing  Musculoskeletal:  [ ]  Arthritis, [ ]  Low back pain,  [x ] Joint pain  Hematologic:No history of hypercoagulable state.  No history of easy bleeding.  No history of anemia  Gastrointestinal: No hematochezia or melena,  No gastroesophageal reflux, no trouble swallowing  Urinary: [ ]  chronic Kidney disease, [ ]  on HD - [ ]  MWF or [ ]  TTHS, [ ]  Burning with urination, [ ]  Frequent urination, [ ]  Difficulty urinating;   Skin: No rashes  Psychological: No history of anxiety,  No history of depression  Social History Social History   Tobacco Use   Smoking status: Every Day    Packs/day: 1.00  Years: 35.00    Pack years: 35.00    Types: Cigarettes   Smokeless tobacco: Never  Vaping Use   Vaping Use: Never used  Substance Use Topics   Alcohol use: No    Comment: hx heavy etoh x 3 yrs, quit 30+ yrs ago   Drug use: No    Family History Family History  Problem Relation Age of Onset   Diabetes Mother    Diabetes Father    Diabetes Brother    Colon cancer Neg Hx    Liver disease Neg Hx     Allergies  No Known Allergies   Current Outpatient Medications   Medication Sig Dispense Refill   amLODipine (NORVASC) 10 MG tablet Take 1 tablet (10 mg total) by mouth daily. 90 tablet 1   aspirin EC 81 MG tablet Take 81 mg by mouth daily.     Cholecalciferol (VITAMIN D) 125 MCG (5000 UT) CAPS Take 2,000 Units by mouth daily.     cloNIDine (CATAPRES) 0.3 MG tablet Take 1 tablet (0.3 mg total) by mouth daily. 90 tablet 0   clopidogrel (PLAVIX) 75 MG tablet Take 1 tablet (75 mg total) by mouth daily. 90 tablet 0   docusate sodium (COLACE) 100 MG capsule Take 1 capsule (100 mg total) by mouth 2 (two) times daily as needed for mild constipation. 60 capsule 0   Ferrous Sulfate (IRON) 325 (65 Fe) MG TABS Take 325 mg by mouth daily.     fluticasone (FLONASE) 50 MCG/ACT nasal spray Place 1 spray into both nostrils daily. 16 g 1   glucose blood test strip 1 each by Other route 2 (two) times daily. Use as instructed bid E11.65 Relion Premier 100 each 5   Insulin Pen Needle (SURE COMFORT PEN NEEDLES) 32G X 4 MM MISC USE ONCE DAILY 100 each 2   Lancets MISC 1 each by Does not apply route 2 (two) times daily. E11.65 Relion Premier 100 each 5   LANTUS SOLOSTAR 100 UNIT/ML Solostar Pen INJECT 14 UNITS UNDER THE SKIN AT BEDTIME (Patient taking differently: daily.) 15 mL 3   lisinopril-hydrochlorothiazide (ZESTORETIC) 20-25 MG tablet Take 1 tablet by mouth daily. 90 tablet 0   nystatin-triamcinolone (MYCOLOG II) cream Apply topically 2 (two) times daily. 30 g 3   SSD 1 % cream Apply topically daily.     traZODone (DESYREL) 50 MG tablet Take 1 tablet (50 mg total) by mouth at bedtime. 90 tablet 1   atorvastatin (LIPITOR) 10 MG tablet Take 1 tablet (10 mg total) by mouth daily. 90 tablet 1   No current facility-administered medications for this visit.    Physical Examination  Vitals:   10/13/21 0914  BP: 130/72  Pulse: (!) 56  Resp: 16  Temp: (!) 97.5 F (36.4 C)  TempSrc: Temporal  SpO2: 100%  Weight: 128 lb (58.1 kg)  Height: 5\' 7"  (1.702 m)    Body mass  index is 20.05 kg/m.  General:  Alert and oriented, no acute distress HEENT: Normal Neck: No bruit or JVD Pulmonary: Clear to auscultation bilaterally Cardiac: Regular Rate and Rhythm without murmur Abdomen: Soft, non-tender, non-distended, no mass, no scars Skin: No rash Extremity Pulses:  2+ radial,  femoral, dorsalis pedis pulses bilaterally Musculoskeletal: No deformity or edema  Neurologic: Upper and lower extremity motor 5/5 and symmetric  DATA:      Abdominal Aorta Findings:  +-----------+-------+----------+----------+--------+--------+--------+   Location    AP (cm) Trans (cm) PSV (cm/s) Waveform Thrombus Comments   +-----------+-------+----------+----------+--------+--------+--------+  Supraceliac 1.78    1.64       97                                      +-----------+-------+----------+----------+--------+--------+--------+   Proximal    1.54    1.88       59                                      +-----------+-------+----------+----------+--------+--------+--------+   RT CIA Prox 0.9     1.2        99                                      +-----------+-------+----------+----------+--------+--------+--------+   LT CIA Prox 0.7     0.7        103                                     +-----------+-------+----------+----------+--------+--------+--------+     Right Stent(s):  +---------------------+--------+--------+--------+--------+   External iliac artery PSV cm/s Stenosis Waveform Comments   +---------------------+--------+--------+--------+--------+   Prox to Stent         170               biphasic            +---------------------+--------+--------+--------+--------+   Proximal Stent        147               biphasic            +---------------------+--------+--------+--------+--------+   Mid Stent             199               biphasic            +---------------------+--------+--------+--------+--------+   Distal Stent          97                biphasic             +---------------------+--------+--------+--------+--------+   Distal to Stent       100               biphasic            +---------------------+--------+--------+--------+--------+             Left Stent(s):  +---------------------+--------+--------+--------+--------+   External iliac artery PSV cm/s Stenosis Waveform Comments   +---------------------+--------+--------+--------+--------+   Prox to Stent         165               biphasic            +---------------------+--------+--------+--------+--------+   Proximal Stent        124               biphasic            +---------------------+--------+--------+--------+--------+   Mid Stent             149               biphasic            +---------------------+--------+--------+--------+--------+  Distal Stent          127               biphasic            +---------------------+--------+--------+--------+--------+   Distal to Stent       129               biphasic            +---------------------+--------+--------+--------+--------+   Summary:  Stenosis: +--------------------+-----------+   Location             Stent         +--------------------+-----------+   Right External Iliac no stenosis   +--------------------+-----------+   Left External Iliac  no stenosis   +--------------------+-----------+     ABI Findings:  +---------+------------------+-----+----------+--------+   Right     Rt Pressure (mmHg) Index Waveform   Comment    +---------+------------------+-----+----------+--------+   Brachial  157                                            +---------+------------------+-----+----------+--------+   PTA       114                0.73  monophasic            +---------+------------------+-----+----------+--------+   DP        86                 0.55  triphasic             +---------+------------------+-----+----------+--------+   Great Toe 74                 0.47                         +---------+------------------+-----+----------+--------+   +---------+------------------+-----+---------+-------+   Left      Lt Pressure (mmHg) Index Waveform  Comment   +---------+------------------+-----+---------+-------+   Brachial  157                                          +---------+------------------+-----+---------+-------+   PTA       150                0.96  triphasic           +---------+------------------+-----+---------+-------+   DP        131                0.83  biphasic            +---------+------------------+-----+---------+-------+   Great Toe 98                 0.62                      +---------+------------------+-----+---------+-------+   +-------+-----------+-----------+------------+------------+   ABI/TBI Today's ABI Today's TBI Previous ABI Previous TBI   +-------+-----------+-----------+------------+------------+   Right   0.73        0.47        0.83         0.71           +-------+-----------+-----------+------------+------------+   Left    0.96  0.52        1.08         0.75           +-------+-----------+-----------+------------+------------+   Right ABIs appear decreased compared to prior study on 10/10/2020. Left  ABIs appear essentially unchanged compared to prior study on 10/10/2020.     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 The right toe-brachial index is abnormal.   Left: Resting left ankle-brachial index is within normal range. No  evidence of significant left lower extremity arterial disease. The left  toe-brachial index is abnormal.   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   He denies new symptoms of ischemia in B LE.  He is not very active and continues to smoke daily.    The iliac stents are widely patent without evidence of re stenosis.  His ABI's show slight decrease in index compared to the last  ABI 1 year ago on the right LE from 0.83 to 0.73, the waveform was improved from monophasic to Triphasic flow in the DP which is palpable today on exam.  The left ABI's are stable with triphasic/biphasic wave forms.  I have asked him to start a walking exercise program and work on smoking cessation.  He states he will try.    F/U in 1 year for repeat studies.  If he has problems or concerns prior he will call our office.       Mosetta Pigeon PA-C [facility] [place] Office: [phone]  Call MD Chestine Spore

## 2021-10-17 ENCOUNTER — Other Ambulatory Visit: Payer: Self-pay

## 2021-10-17 ENCOUNTER — Telehealth: Payer: Self-pay | Admitting: Internal Medicine

## 2021-10-17 MED ORDER — CLOPIDOGREL BISULFATE 75 MG PO TABS: 75.0000 mg | ORAL_TABLET | Freq: Every day | ORAL | 0 refills | Status: DC

## 2021-10-17 NOTE — Telephone Encounter (Signed)
Pt needs refill on    clopidogrel (PLAVIX) 75 MG tablet

## 2021-10-25 ENCOUNTER — Emergency Department (HOSPITAL_COMMUNITY)
Admission: EM | Admit: 2021-10-25 | Discharge: 2021-10-25 | Disposition: A | Discharge status: Home / Self Care | Payer: Medicare Other | Attending: Emergency Medicine | Admitting: Emergency Medicine

## 2021-10-25 ENCOUNTER — Emergency Department (HOSPITAL_COMMUNITY): Payer: Medicare Other

## 2021-10-25 ENCOUNTER — Other Ambulatory Visit: Payer: Self-pay

## 2021-10-25 ENCOUNTER — Encounter (HOSPITAL_COMMUNITY): Payer: Self-pay

## 2021-10-25 DIAGNOSIS — I10 Essential (primary) hypertension: Secondary | ICD-10-CM | POA: Diagnosis not present

## 2021-10-25 DIAGNOSIS — E119 Type 2 diabetes mellitus without complications: Secondary | ICD-10-CM | POA: Insufficient documentation

## 2021-10-25 DIAGNOSIS — Z7951 Long term (current) use of inhaled steroids: Secondary | ICD-10-CM | POA: Diagnosis not present

## 2021-10-25 DIAGNOSIS — I700 Atherosclerosis of aorta: Secondary | ICD-10-CM | POA: Diagnosis not present

## 2021-10-25 DIAGNOSIS — Z79899 Other long term (current) drug therapy: Secondary | ICD-10-CM | POA: Insufficient documentation

## 2021-10-25 DIAGNOSIS — Z794 Long term (current) use of insulin: Secondary | ICD-10-CM | POA: Insufficient documentation

## 2021-10-25 DIAGNOSIS — R1033 Periumbilical pain: Secondary | ICD-10-CM | POA: Diagnosis not present

## 2021-10-25 DIAGNOSIS — Z7982 Long term (current) use of aspirin: Secondary | ICD-10-CM | POA: Insufficient documentation

## 2021-10-25 DIAGNOSIS — R109 Unspecified abdominal pain: Secondary | ICD-10-CM | POA: Diagnosis not present

## 2021-10-25 LAB — COMPREHENSIVE METABOLIC PANEL
ALT: 13 U/L (ref 0–44)
AST: 21 U/L (ref 15–41)
Albumin: 4.1 g/dL (ref 3.5–5.0)
Alkaline Phosphatase: 103 U/L (ref 38–126)
Anion gap: 9 (ref 5–15)
BUN: 13 mg/dL (ref 8–23)
CO2: 28 mmol/L (ref 22–32)
Calcium: 9.2 mg/dL (ref 8.9–10.3)
Chloride: 97 mmol/L — ABNORMAL LOW (ref 98–111)
Creatinine, Ser: 1.33 mg/dL — ABNORMAL HIGH (ref 0.61–1.24)
GFR, Estimated: 58 mL/min — ABNORMAL LOW (ref >60)
Glucose, Bld: 197 mg/dL — ABNORMAL HIGH (ref 70–99)
Potassium: 3.5 mmol/L (ref 3.5–5.1)
Sodium: 134 mmol/L — ABNORMAL LOW (ref 135–145)
Total Bilirubin: 0.7 mg/dL (ref 0.3–1.2)
Total Protein: 7.8 g/dL (ref 6.5–8.1)

## 2021-10-25 LAB — CBC WITH DIFFERENTIAL/PLATELET
Abs Immature Granulocytes: 0.02 10*3/uL (ref 0.00–0.07)
Basophils Absolute: 0 10*3/uL (ref 0.0–0.1)
Basophils Relative: 1 %
Eosinophils Absolute: 0 10*3/uL (ref 0.0–0.5)
Eosinophils Relative: 1 %
HCT: 42.5 % (ref 39.0–52.0)
Hemoglobin: 14.2 g/dL (ref 13.0–17.0)
Immature Granulocytes: 0 %
Lymphocytes Relative: 31 %
Lymphs Abs: 1.9 10*3/uL (ref 0.7–4.0)
MCH: 32.6 pg (ref 26.0–34.0)
MCHC: 33.4 g/dL (ref 30.0–36.0)
MCV: 97.5 fL (ref 80.0–100.0)
Monocytes Absolute: 0.5 10*3/uL (ref 0.1–1.0)
Monocytes Relative: 8 %
Neutro Abs: 3.6 10*3/uL (ref 1.7–7.7)
Neutrophils Relative %: 59 %
Platelets: 180 10*3/uL (ref 150–400)
RBC: 4.36 MIL/uL (ref 4.22–5.81)
RDW: 13.2 % (ref 11.5–15.5)
WBC: 6.1 10*3/uL (ref 4.0–10.5)
nRBC: 0 % (ref 0.0–0.2)

## 2021-10-25 LAB — LIPASE, BLOOD: Lipase: 26 U/L (ref 11–51)

## 2021-10-25 MED ORDER — IOHEXOL 300 MG/ML  SOLN
100.0000 mL | Freq: Once | INTRAMUSCULAR | Status: AC | PRN
  Administered 2021-10-25: 80 mL via INTRAVENOUS

## 2021-10-25 NOTE — ED Provider Notes (Addendum)
[facility] Provider Note   CSN: [id number] Arrival date & time: 10/25/21  1108     History  Chief Complaint  Patient presents with   Abdominal Pain    33mo     Chase Briggs is a 71 y.o. male.  Patient with a complaint for about a month of bulge and discomfort to the left lateral side of his umbilicus.  No nausea or vomiting.  Primary care doctor had ordered an ultrasound.  But everything looked okay on that.  Pain is intermittent.  But its been a little more pronounced lately.  Does admit that he injects his insulin into that area.  Past medical history is significant for hypertension diabetes hyperlipidemia gastroesophageal reflux disease.      Home Medications Prior to Admission medications   Medication Sig Start Date End Date Taking? Authorizing Provider  amLODipine (NORVASC) 10 MG tablet Take 1 tablet (10 mg total) by mouth daily. 06/10/21  Yes Lamptey, Myrene Galas, MD  aspirin EC 81 MG tablet Take 81 mg by mouth daily.   Yes [provider]  atorvastatin (LIPITOR) 10 MG tablet Take 1 tablet (10 mg total) by mouth daily. 06/10/21 10/25/21 Yes Lamptey, Myrene Galas, MD  Cholecalciferol (VITAMIN D) 125 MCG (5000 UT) CAPS Take 2,000 Units by mouth daily.   Yes [provider]  cloNIDine (CATAPRES) 0.3 MG tablet Take 1 tablet (0.3 mg total) by mouth daily. 08/27/21  Yes Lindell Spar, MD  clopidogrel (PLAVIX) 75 MG tablet Take 1 tablet (75 mg total) by mouth daily. 10/17/21  Yes Lindell Spar, MD  docusate sodium (COLACE) 100 MG capsule Take 1 capsule (100 mg total) by mouth 2 (two) times daily as needed for mild constipation. 09/23/21  Yes Lindell Spar, MD  Ferrous Sulfate (IRON) 325 (65 Fe) MG TABS Take 325 mg by mouth daily.   Yes [provider]  LANTUS SOLOSTAR 100 UNIT/ML Solostar Pen INJECT 14 UNITS UNDER THE SKIN AT BEDTIME Patient taking differently: 14 Units daily. 03/18/21  Yes Nida, Marella Chimes, MD   lisinopril-hydrochlorothiazide (ZESTORETIC) 20-25 MG tablet Take 1 tablet by mouth daily. 09/29/21  Yes Patel, Colin Broach, MD  RESTASIS 0.05 % ophthalmic emulsion Place 1 drop into both eyes 2 (two) times daily. 10/02/21  Yes [provider]  traZODone (DESYREL) 50 MG tablet Take 1 tablet (50 mg total) by mouth at bedtime. 09/10/21  Yes Lindell Spar, MD  fluticasone (FLONASE) 50 MCG/ACT nasal spray Place 1 spray into both nostrils daily. Patient not taking: Reported on 10/25/2021 02/12/21   Ailene Ards, NP  glucose blood test strip 1 each by Other route 2 (two) times daily. Use as instructed bid E11.65 Relion Premier 05/15/19   Cassandria Anger, MD  Insulin Pen Needle (SURE COMFORT PEN NEEDLES) 32G X 4 MM MISC USE ONCE DAILY 02/07/21   Cassandria Anger, MD  Lancets MISC 1 each by Does not apply route 2 (two) times daily. E11.65 Relion Premier 05/15/19   Cassandria Anger, MD  nystatin-triamcinolone (MYCOLOG II) cream Apply topically 2 (two) times daily. Patient not taking: Reported on 10/25/2021 07/04/21   Cleon Gustin, MD      Allergies    Patient has no known allergies.    Review of Systems   Review of Systems  Constitutional:  Negative for chills and fever.  HENT:  Negative for ear pain and sore throat.   Eyes:  Negative for pain and visual disturbance.  Respiratory:  Negative for cough and shortness of breath.   Cardiovascular:  Negative for chest pain and palpitations.  Gastrointestinal:  Positive for abdominal pain. Negative for diarrhea, nausea and vomiting.  Genitourinary:  Negative for dysuria and hematuria.  Musculoskeletal:  Negative for arthralgias and back pain.  Skin:  Negative for color change and rash.  Neurological:  Negative for seizures and syncope.  All other systems reviewed and are negative.  Physical Exam Updated Vital Signs BP (!) 153/86    Pulse 62    Temp 98.1 F (36.7 C) (Oral)    Resp 18    Ht 1.702 m (5\' 7" )    Wt 58.1 kg    SpO2  100%    BMI 20.05 kg/m  Physical Exam Vitals and nursing note reviewed.  Constitutional:      General: He is not in acute distress.    Appearance: Normal appearance. He is well-developed. He is not ill-appearing or toxic-appearing.  HENT:     Head: Normocephalic and atraumatic.  Eyes:     Extraocular Movements: Extraocular movements intact.     Conjunctiva/sclera: Conjunctivae normal.     Pupils: Pupils are equal, round, and reactive to light.  Cardiovascular:     Rate and Rhythm: Normal rate and regular rhythm.     Heart sounds: No murmur heard. Pulmonary:     Effort: Pulmonary effort is normal. No respiratory distress.     Breath sounds: Normal breath sounds.  Abdominal:     Palpations: Abdomen is soft. There is mass.     Tenderness: There is abdominal tenderness. There is no guarding.     Comments: Somewhat of a bulge in some mild tenderness and some induration to the left lateral aspect of the umbilicus.  Musculoskeletal:        General: No swelling.     Cervical back: Normal range of motion and neck supple.  Skin:    General: Skin is warm and dry.     Capillary Refill: Capillary refill takes less than 2 seconds.  Neurological:     General: No focal deficit present.     Mental Status: He is alert and oriented to person, place, and time.     Cranial Nerves: No cranial nerve deficit.     Sensory: No sensory deficit.  Psychiatric:        Mood and Affect: Mood normal.    ED Results / Procedures / Treatments   Labs (all labs ordered are listed, but only abnormal results are displayed) Labs Reviewed  COMPREHENSIVE METABOLIC PANEL - Abnormal; Notable for the following components:      Result Value   Sodium 134 (*)    Chloride 97 (*)    Glucose, Bld 197 (*)    Creatinine, Ser 1.33 (*)    GFR, Estimated 58 (*)    All other components within normal limits  CBC WITH DIFFERENTIAL/PLATELET  LIPASE, BLOOD    EKG None  Radiology No results  found.  Procedures Procedures    Medications Ordered in ED Medications  iohexol (OMNIPAQUE) 300 MG/ML solution 100 mL (80 mLs Intravenous Contrast Given 10/25/21 1420)    ED Course/ Medical Decision Making/ A&P                           Medical Decision Making Amount and/or Complexity of Data Reviewed Labs: ordered. Radiology: ordered.  Risk Prescription drug management.   Symptoms may be secondary to injection of insulin  into that area.  Will get CT scan since patient's already had ultrasound to rule out ventral hernia or any underlying abnormalities.  Patient nontoxic no acute distress.  CT scan results pending on him.  Disposition will be based on them.  Concern was for ventral hernia.  ET scan without any acute findings.  Raises some concerns may be for cystitis.  The patient not having dysuria.  Pain is not suprapubic area.  Labs CBC complete metabolic panel without any significant abnormalities.  Pleat metabolic panel had a glucose of 197 GFR 58.  Liver function tests good creatinine up some at 1.33.  Lipase fine.  And no leukocytosis hemoglobin 14.2  Patient can follow back up with primary care doctor.  Final Clinical Impression(s) / ED Diagnoses Final diagnoses:  Periumbilical abdominal pain    Rx / DC Orders ED Discharge Orders     None         Fredia Sorrow, MD 10/25/21 1220    Fredia Sorrow, MD 10/25/21 1505    Fredia Sorrow, MD 10/25/21 [phone]

## 2021-10-25 NOTE — Discharge Instructions (Signed)
CT scans without any significant abnormalities of the abdomen.  No evidence of any hernia.  No evidence of any abdominal wall infection.  Follow back up with your primary care doctor.

## 2021-10-25 NOTE — ED Triage Notes (Signed)
Pov from home . Cc of umbilical abdominal pain for over a month. Has already been to pcp and had an ultra sound. Everything was negative. Said the pain is intermittent.

## 2021-11-04 ENCOUNTER — Other Ambulatory Visit: Payer: Self-pay | Admitting: "Endocrinology

## 2021-12-05 ENCOUNTER — Telehealth: Payer: Self-pay

## 2021-12-05 ENCOUNTER — Other Ambulatory Visit: Payer: Self-pay | Admitting: Internal Medicine

## 2021-12-05 ENCOUNTER — Other Ambulatory Visit: Payer: Self-pay | Admitting: *Deleted

## 2021-12-05 DIAGNOSIS — I739 Peripheral vascular disease, unspecified: Secondary | ICD-10-CM

## 2021-12-05 DIAGNOSIS — G4700 Insomnia, unspecified: Secondary | ICD-10-CM

## 2021-12-05 MED ORDER — ATORVASTATIN CALCIUM 10 MG PO TABS: 10.0000 mg | ORAL_TABLET | Freq: Every day | ORAL | 1 refills | Status: DC

## 2021-12-05 MED ORDER — TRAZODONE HCL 50 MG PO TABS: 50.0000 mg | ORAL_TABLET | Freq: Every day | ORAL | 1 refills | Status: DC

## 2021-12-05 NOTE — Telephone Encounter (Signed)
Patient came by the office need med refill ? ?atorvastatin (LIPITOR) 10 MG tablet  ? ? ?Trazodone 50 mg ? ?[facility] ? ? ? ?

## 2021-12-08 DIAGNOSIS — L603 Nail dystrophy: Secondary | ICD-10-CM | POA: Diagnosis not present

## 2021-12-08 DIAGNOSIS — E1151 Type 2 diabetes mellitus with diabetic peripheral angiopathy without gangrene: Secondary | ICD-10-CM | POA: Diagnosis not present

## 2021-12-08 DIAGNOSIS — L851 Acquired keratosis [keratoderma] palmaris et plantaris: Secondary | ICD-10-CM | POA: Diagnosis not present

## 2021-12-08 DIAGNOSIS — M79674 Pain in right toe(s): Secondary | ICD-10-CM | POA: Diagnosis not present

## 2021-12-15 ENCOUNTER — Ambulatory Visit (INDEPENDENT_AMBULATORY_CARE_PROVIDER_SITE_OTHER): Payer: Medicare Other

## 2021-12-15 DIAGNOSIS — Z0000 Encounter for general adult medical examination without abnormal findings: Secondary | ICD-10-CM

## 2021-12-15 NOTE — Patient Instructions (Signed)
?  Mr. Chase Briggs , ?Thank you for taking time to come for your Medicare Wellness Visit. I appreciate your ongoing commitment to your health goals. Please review the following plan we discussed and let me know if I can assist you in the future.  ? ?These are the goals we discussed: ? Goals   ? ?  Patient Stated   ?  Patient states that his goal is to stop smoking cigarettes.  ?  ? ?  ?  ?This is a list of the screening recommended for you and due dates:  ?Health Maintenance  ?Topic Date Due  ? Pneumonia Vaccine (1 - PCV) Never done  ? Tetanus Vaccine  Never done  ? Zoster (Shingles) Vaccine (1 of 2) Never done  ? COVID-19 Vaccine (3 - Booster for Moderna series) 04/02/2020  ? Colon Cancer Screening  11/16/2020  ? Complete foot exam   12/11/2020  ? Hemoglobin A1C  02/24/2022  ? Flu Shot  04/14/2022  ? Eye exam for diabetics  09/23/2022  ? Hepatitis C Screening: USPSTF Recommendation to screen - Ages 84-79 yo.  Completed  ? HPV Vaccine  Aged Out  ?  ?

## 2021-12-15 NOTE — Progress Notes (Signed)
? ?Subjective:  ? Chase Briggs is a 71 y.o. male who presents for Medicare Annual/Subsequent preventive examination. ?I connected with  Chase Briggs on 12/15/21 by a audio enabled telemedicine application and verified that I am speaking with the correct person using two identifiers. ? ?Patient Location: Home ? ?Provider Location: Office/Clinic ? ?I discussed the limitations of evaluation and management by telemedicine. The patient expressed understanding and agreed to proceed.  ?Review of Systems    ? ?Cardiac Risk Factors include: advanced age (>45men, >59 women);male gender;diabetes mellitus;hypertension ? ?   ?Objective:  ?  ?There were no vitals filed for this visit. ?There is no height or weight on file to calculate BMI. ? ? ?  12/15/2021  ?  8:37 AM 10/25/2021  ? 11:17 AM 01/28/2021  ? 11:27 AM 01/21/2021  ?  7:43 PM 12/12/2020  ? 10:32 AM 03/21/2019  ?  6:43 AM 09/16/2018  ?  8:36 AM  ?Advanced Directives  ?Does Patient Have a Medical Advance Directive? No No No No No No No  ?Would patient like information on creating a medical advance directive?   No - Patient declined No - Patient declined No - Patient declined No - Patient declined   ? ? ?Current Medications (verified) ?Outpatient Encounter Medications as of 12/15/2021  ?Medication Sig  ? amLODipine (NORVASC) 10 MG tablet Take 1 tablet (10 mg total) by mouth daily.  ? aspirin EC 81 MG tablet Take 81 mg by mouth daily.  ? atorvastatin (LIPITOR) 10 MG tablet Take 1 tablet (10 mg total) by mouth daily.  ? Cholecalciferol (VITAMIN D) 125 MCG (5000 UT) CAPS Take 2,000 Units by mouth daily.  ? cloNIDine (CATAPRES) 0.3 MG tablet Take 1 tablet (0.3 mg total) by mouth daily.  ? clopidogrel (PLAVIX) 75 MG tablet Take 1 tablet (75 mg total) by mouth daily.  ? docusate sodium (COLACE) 100 MG capsule Take 1 capsule (100 mg total) by mouth 2 (two) times daily as needed for mild constipation.  ? Ferrous Sulfate (IRON) 325 (65 Fe) MG TABS Take 325 mg by mouth daily.   ? glucose blood test strip 1 each by Other route 2 (two) times daily. Use as instructed bid E11.65 Relion Premier  ? Lancets MISC 1 each by Does not apply route 2 (two) times daily. E11.65 Relion Premier  ? LANTUS SOLOSTAR 100 UNIT/ML Solostar Pen INJECT 14 UNITS UNDER THE SKIN AT BEDTIME (Patient taking differently: 14 Units daily.)  ? lisinopril-hydrochlorothiazide (ZESTORETIC) 20-25 MG tablet Take 1 tablet by mouth daily.  ? RESTASIS 0.05 % ophthalmic emulsion Place 1 drop into both eyes 2 (two) times daily.  ? SURE COMFORT PEN NEEDLES 32G X 4 MM MISC USE ONCE DAILY  ? traZODone (DESYREL) 50 MG tablet Take 1 tablet (50 mg total) by mouth at bedtime.  ? fluticasone (FLONASE) 50 MCG/ACT nasal spray Place 1 spray into both nostrils daily. (Patient not taking: Reported on 10/25/2021)  ? nystatin-triamcinolone (MYCOLOG II) cream Apply topically 2 (two) times daily. (Patient not taking: Reported on 10/25/2021)  ? ?No facility-administered encounter medications on file as of 12/15/2021.  ? ? ?Allergies (verified) ?Patient has no known allergies.  ? ?History: ?Past Medical History:  ?Diagnosis Date  ? Diabetes mellitus   ? Fatty liver   ? GERD (gastroesophageal reflux disease)   ? HTN (hypertension)   ? Hyperlipidemia   ? Insomnia   ? ?Past Surgical History:  ?Procedure Laterality Date  ? ABDOMINAL AORTOGRAM W/LOWER EXTREMITY N/A 08/22/2018  ?  Procedure: ABDOMINAL AORTOGRAM W/LOWER EXTREMITY;  Surgeon: Waynetta Sandy, MD;  Location: [facility];  Service: Cardiovascular;  Laterality: N/A;  ? BACK SURGERY    ? CARPAL TUNNEL WITH CUBITAL TUNNEL Right 03/21/2019  ? Procedure: RIGHT CARPAL TUNNEL AND CUBITAL  TUNNEL RELEASES;  Surgeon: Daryll Brod, MD;  Location: [place];  Service: Orthopedics;  Laterality: Right;  AXILLARY BLOCK  ? COLONOSCOPY  01/2007  ? Dr. Arnoldo Morale, reviewed report, mild diverticulosis. Prep adequate. Next TCS due 01/2017  ? ESOPHAGOGASTRODUODENOSCOPY N/A 10/21/2012  ?  [id number] GASTRITIS  ? MASS EXCISION Right 10/26/2012  ? Procedure: EXCISION MASS;  Surgeon: Jamesetta So, MD;  Location: [facility];  Service: General;  Laterality: Right;  ? PERIPHERAL VASCULAR INTERVENTION  08/22/2018  ? Procedure: PERIPHERAL VASCULAR INTERVENTION;  Surgeon: Waynetta Sandy, MD;  Location: [facility];  Service: Cardiovascular;;  bilateral external iliac stents  ? scalp mass  4-5 yrs ago  ? Dr Arnoldo Morale  ? ULNAR NERVE TRANSPOSITION Right 03/21/2019  ? Procedure: DECOMPRESSION RIGHT ULNAR NERVE;  Surgeon: Daryll Brod, MD;  Location: [place] [facility];  Service: Orthopedics;  Laterality: Right;  ? ?Family History  ?Problem Relation Age of Onset  ? Diabetes Mother   ? Diabetes Father   ? Diabetes Brother   ? Colon cancer Neg Hx   ? Liver disease Neg Hx   ? ?Social History  ? ?Socioeconomic History  ? Marital status: Married  ?  Spouse name: Not on file  ? Number of children: 2  ? Years of education: Not on file  ? Highest education level: Not on file  ?Occupational History  ? Occupation: reitred  ?  Comment: [employer], heavy lifting  ?Tobacco Use  ? Smoking status: Every Day  ?  Packs/day: 1.00  ?  Years: 35.00  ?  Pack years: 35.00  ?  Types: Cigarettes  ? Smokeless tobacco: Never  ?Vaping Use  ? Vaping Use: Never used  ?Substance and Sexual Activity  ? Alcohol use: No  ?  Comment: hx heavy etoh x 3 yrs, quit 30+ yrs ago  ? Drug use: No  ? Sexual activity: Yes  ?  Birth control/protection: None  ?Other Topics Concern  ? Not on file  ?Social History Narrative  ? 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 Married for 12 years. Has 2 sons (64 and 17). Lives with wife.  ? ?Social Determinants of Health  ? ?Financial Resource Strain: Not on file  ?Food Insecurity: Not on file  ?Transportation Needs: Not on file  ?Physical Activity: Not on file  ?Stress: Not on file  ?Social Connections: Not on file  ? ? ?Tobacco Counseling ?Ready to quit: Not  Answered ?Counseling given: Not Answered ? ? ?Clinical Intake: ? ?  ? ?Pain : No/denies pain ? ?  ? ?Diabetes: Yes ?CBG done?: No ?Did pt. bring in CBG monitor from home?: No ? ?How often do you need to have someone help you when you read instructions, pamphlets, or other written materials from your doctor or pharmacy?: 1 - Never ? ?Diabetic?yes ? ?  ?Nutrition Risk Assessment: ? ?Has the patient had any N/V/D within the last 2 months?  No  ?Does the patient have any non-healing wounds?  No  ?Has the patient had any unintentional weight loss or weight gain?  No  ? ?Diabetes: ? ?Is the patient diabetic?  Yes  ?If diabetic, was a CBG  obtained today?  No  ?Did the patient bring in their glucometer from home?  No  ?How often do you monitor your CBG's? .  ? ?Financial Strains and Diabetes Management: ? ?Are you having any financial strains with the device, your supplies or your medication? No .  ?Does the patient want to be seen by Chronic Care Management for management of their diabetes?  No  ?Would the patient like to be referred to a Nutritionist or for Diabetic Management?  No  ? ?Diabetic Exams: ? ?Diabetic Eye Exam: Completed 09/23/21 ?Diabetic Foot Exam: Overdue, Pt has been advised about the importance in completing this exam. Pt is scheduled for diabetic foot exam on  .  ?  ? ? ?Activities of Daily Living ? ?  12/15/2021  ?  8:38 AM  ?In your present state of health, do you have any difficulty performing the following activities:  ?Hearing? 0  ?Vision? 0  ?Difficulty concentrating or making decisions? 0  ?Walking or climbing stairs? 0  ?Dressing or bathing? 0  ?Doing errands, shopping? 0  ?Preparing Food and eating ? N  ?Using the Toilet? N  ?In the past six months, have you accidently leaked urine? N  ?Do you have problems with loss of bowel control? N  ?Managing your Medications? N  ?Managing your Finances? N  ?Housekeeping or managing your Housekeeping? N  ? ? ?Patient Care Team: ?Lindell Spar, MD as PCP -  General (Internal Medicine) ?Arnoldo Lenis, MD as PCP - Cardiology (Cardiology) ?Fields, Marga Melnick, MD (Inactive) (Gastroenterology) ? ?Indicate any recent Medical Services you may have received from other than [facility]

## 2021-12-17 ENCOUNTER — Encounter (INDEPENDENT_AMBULATORY_CARE_PROVIDER_SITE_OTHER): Payer: Medicare Other | Admitting: Nurse Practitioner

## 2021-12-22 ENCOUNTER — Other Ambulatory Visit: Payer: Self-pay | Admitting: Internal Medicine

## 2021-12-22 ENCOUNTER — Telehealth: Payer: Self-pay

## 2021-12-22 ENCOUNTER — Other Ambulatory Visit: Payer: Self-pay | Admitting: *Deleted

## 2021-12-22 MED ORDER — AMLODIPINE BESYLATE 10 MG PO TABS: 10.0000 mg | ORAL_TABLET | Freq: Every day | ORAL | 1 refills | Status: DC

## 2021-12-22 NOTE — Telephone Encounter (Signed)
Pt medication sent to pharmacy  

## 2021-12-22 NOTE — Telephone Encounter (Signed)
Patient came into office need med refill ? ?clopidogrel (PLAVIX) 75 MG tablet   ? ? ?amLODipine (NORVASC) 10 MG tablet  ? ?Pharmacy: [facility]  ?

## 2022-01-19 ENCOUNTER — Ambulatory Visit (INDEPENDENT_AMBULATORY_CARE_PROVIDER_SITE_OTHER): Payer: Medicare Other | Admitting: Internal Medicine

## 2022-01-19 ENCOUNTER — Encounter: Payer: Self-pay | Admitting: Internal Medicine

## 2022-01-19 VITALS — BP 118/82 | HR 64 | Resp 18 | Ht 67.5 in | Wt 131.0 lb

## 2022-01-19 DIAGNOSIS — E1169 Type 2 diabetes mellitus with other specified complication: Secondary | ICD-10-CM

## 2022-01-19 DIAGNOSIS — I10 Essential (primary) hypertension: Secondary | ICD-10-CM | POA: Diagnosis not present

## 2022-01-19 DIAGNOSIS — E441 Mild protein-calorie malnutrition: Secondary | ICD-10-CM

## 2022-01-19 DIAGNOSIS — F17210 Nicotine dependence, cigarettes, uncomplicated: Secondary | ICD-10-CM | POA: Diagnosis not present

## 2022-01-19 DIAGNOSIS — Z720 Tobacco use: Secondary | ICD-10-CM

## 2022-01-19 DIAGNOSIS — E782 Mixed hyperlipidemia: Secondary | ICD-10-CM | POA: Diagnosis not present

## 2022-01-19 DIAGNOSIS — I739 Peripheral vascular disease, unspecified: Secondary | ICD-10-CM | POA: Diagnosis not present

## 2022-01-19 DIAGNOSIS — Z1211 Encounter for screening for malignant neoplasm of colon: Secondary | ICD-10-CM | POA: Diagnosis not present

## 2022-01-19 DIAGNOSIS — J3089 Other allergic rhinitis: Secondary | ICD-10-CM

## 2022-01-19 MED ORDER — FLUTICASONE PROPIONATE 50 MCG/ACT NA SUSP: 1.0000 | Freq: Every day | NASAL | 3 refills | Status: DC

## 2022-01-19 NOTE — Assessment & Plan Note (Signed)
Used to have weight loss, now stable ?Has decreased appetite, but no other B symptoms ?Advised to take protein supplement such as Glucerna ?

## 2022-01-19 NOTE — Assessment & Plan Note (Signed)
S/p right and left iliac stent placement Followed by vascular surgery and cardiology On aspirin, Plavix and statin 

## 2022-01-19 NOTE — Assessment & Plan Note (Signed)
On statin °Check lipid profile in the next visit °

## 2022-01-19 NOTE — Assessment & Plan Note (Signed)
His nasal congestion and postnasal drip likely due to seasonal allergies, refilled Flonase for now ?

## 2022-01-19 NOTE — Assessment & Plan Note (Signed)
BP Readings from Last 1 Encounters:  ?01/19/22 118/82  ? ?Well-controlled with amlodipine, lisinopril-HCTZ and clonidine ?Followed by cardiology ?Counseled for compliance with the medications ?Advised DASH diet and moderate exercise/walking, at least 150 mins/week ?

## 2022-01-19 NOTE — Assessment & Plan Note (Signed)
Smokes about 3-4 cigarettes per day  Asked about quitting: confirms that he currently smokes cigarettes Advise to quit smoking: Educated about QUITTING to reduce the risk of cancer, cardio and cerebrovascular disease. Assess willingness: Unwilling to quit at this time, but is working on cutting back. Assist with counseling and pharmacotherapy: Counseled for 5 minutes and literature provided. Arrange for follow up:  Follow up in 3 months and continue to offer help. 

## 2022-01-19 NOTE — Progress Notes (Signed)
? ?Established Patient Office Visit ? ?Subjective:  ?Patient ID: Chase Briggs, male    DOB: 1950/12/05  Age: 71 y.o. MRN: [id number] ? ?CC:  ?Chief Complaint  ?Patient presents with  ? Follow-up  ?  6 month follow up HTN and DM pt has had clear nasal drip 3 days and pt has no appetite   ? ? ?HPI ?Chase Briggs is a 71 y.o. male with past medical history of HTN, PAD, allergic rhinitis, type II DM, HLD, insomnia, anemia and tobacco abuse who presents for f/u of his chronic medical conditions. ? ?HTN: BP is well-controlled. Takes medications regularly. Patient denies headache, dizziness, chest pain, dyspnea or palpitations.  He follows up with cardiology as well. ?  ?PAD: Has had right and left iliac stent placement, followed by vascular surgery.  He takes aspirin, Plavix and statin.  Denies any recent worsening of claudication symptoms. ?  ?Type II DM with HLD: He takes Lantus 14 units in the morning, followed by Dr. Dorris Fetch.  Denies any fatigue, polyuria or polydipsia.  He takes Lipitor for HLD. He c/o decreased appetite, but denies any weight loss recently. Denies any fever, chills, chronic cough, hemoptysis, or night sweats. ? ?Allergic rhinitis and nosebleeds: Complains of nasal congestion and postnasal drip X 3-4 days, has run out of Flonase nasal spray. Denies any fever, chills, or sore throat. ?  ?He takes trazodone for insomnia.  Denies any anhedonia, SI or HI currently. ?  ?He takes iron supplements for anemia.  Denies any active signs of bleeding like melena, hematochezia or hematuria currently.  Denies any easy bruising currently. ?  ?He smokes about 3 to 4 cigarettes/day, but used to smoke 1.5 pack/day x15 years in the past. ? ? ? ?Past Medical History:  ?Diagnosis Date  ? Diabetes mellitus   ? Fatty liver   ? GERD (gastroesophageal reflux disease)   ? HTN (hypertension)   ? Hyperlipidemia   ? Insomnia   ? ? ?Past Surgical History:  ?Procedure Laterality Date  ? ABDOMINAL AORTOGRAM W/LOWER  EXTREMITY N/A 08/22/2018  ? Procedure: ABDOMINAL AORTOGRAM W/LOWER EXTREMITY;  Surgeon: Waynetta Sandy, MD;  Location: [place] [facility];  Service: Cardiovascular;  Laterality: N/A;  ? BACK SURGERY    ? CARPAL TUNNEL WITH CUBITAL TUNNEL Right 03/21/2019  ? Procedure: RIGHT CARPAL TUNNEL AND CUBITAL  TUNNEL RELEASES;  Surgeon: Daryll Brod, MD;  Location: [place];  Service: Orthopedics;  Laterality: Right;  AXILLARY BLOCK  ? COLONOSCOPY  01/2007  ? Dr. Arnoldo Morale, reviewed report, mild diverticulosis. Prep adequate. Next TCS due 01/2017  ? ESOPHAGOGASTRODUODENOSCOPY N/A 10/21/2012  ? [id number] GASTRITIS  ? MASS EXCISION Right 10/26/2012  ? Procedure: EXCISION MASS;  Surgeon: Jamesetta So, MD;  Location: [facility];  Service: General;  Laterality: Right;  ? PERIPHERAL VASCULAR INTERVENTION  08/22/2018  ? Procedure: PERIPHERAL VASCULAR INTERVENTION;  Surgeon: Waynetta Sandy, MD;  Location: [place] [facility];  Service: Cardiovascular;;  bilateral external iliac stents  ? scalp mass  4-5 yrs ago  ? Dr Arnoldo Morale  ? ULNAR NERVE TRANSPOSITION Right 03/21/2019  ? Procedure: DECOMPRESSION RIGHT ULNAR NERVE;  Surgeon: Daryll Brod, MD;  Location: [place];  Service: Orthopedics;  Laterality: Right;  ? ? ?Family History  ?Problem Relation Age of Onset  ? Diabetes Mother   ? Diabetes Father   ? Diabetes Brother   ? Colon cancer Neg Hx   ? Liver disease Neg Hx   ? ? ?  Social History  ? ?Socioeconomic History  ? Marital status: Married  ?  Spouse name: Not on file  ? Number of children: 2  ? Years of education: Not on file  ? Highest education level: Not on file  ?Occupational History  ? Occupation: reitred  ?  Comment: [employer], heavy lifting  ?Tobacco Use  ? Smoking status: Every Day  ?  Packs/day: 1.00  ?  Years: 35.00  ?  Pack years: 35.00  ?  Types: Cigarettes  ? Smokeless tobacco: Never  ?Vaping Use  ? Vaping Use: Never used  ?Substance and Sexual Activity  ? Alcohol use: No  ?  Comment:  hx heavy etoh x 3 yrs, quit 30+ yrs ago  ? Drug use: No  ? Sexual activity: Yes  ?  Birth control/protection: None  ?Other Topics Concern  ? Not on file  ?Social History Narrative  ? 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 Married for 12 years. Has 2 sons (53 and 63). Lives with wife.  ? ?Social Determinants of Health  ? ?Financial Resource Strain: Not on file  ?Food Insecurity: Not on file  ?Transportation Needs: Not on file  ?Physical Activity: Not on file  ?Stress: Not on file  ?Social Connections: Not on file  ?Intimate Partner Violence: Not on file  ? ? ?Outpatient Medications Prior to Visit  ?Medication Sig Dispense Refill  ? amLODipine (NORVASC) 10 MG tablet Take 1 tablet (10 mg total) by mouth daily. 90 tablet 1  ? aspirin EC 81 MG tablet Take 81 mg by mouth daily.    ? atorvastatin (LIPITOR) 10 MG tablet Take 1 tablet (10 mg total) by mouth daily. 90 tablet 1  ? Cholecalciferol (VITAMIN D) 125 MCG (5000 UT) CAPS Take 2,000 Units by mouth daily.    ? cloNIDine (CATAPRES) 0.3 MG tablet Take 1 tablet (0.3 mg total) by mouth daily. 90 tablet 0  ? clopidogrel (PLAVIX) 75 MG tablet TAKE (1) TABLET BY MOUTH ONCE DAILY. 90 tablet 0  ? docusate sodium (COLACE) 100 MG capsule Take 1 capsule (100 mg total) by mouth 2 (two) times daily as needed for mild constipation. 60 capsule 0  ? Ferrous Sulfate (IRON) 325 (65 Fe) MG TABS Take 325 mg by mouth daily.    ? glucose blood test strip 1 each by Other route 2 (two) times daily. Use as instructed bid E11.65 Relion Premier 100 each 5  ? Lancets MISC 1 each by Does not apply route 2 (two) times daily. E11.65 Relion Premier 100 each 5  ? LANTUS SOLOSTAR 100 UNIT/ML Solostar Pen INJECT 14 UNITS UNDER THE SKIN AT BEDTIME (Patient taking differently: 14 Units daily.) 15 mL 3  ? lisinopril-hydrochlorothiazide (ZESTORETIC) 20-25 MG tablet Take 1 tablet by mouth daily. 90 tablet 0  ? nystatin-triamcinolone (MYCOLOG II) cream Apply topically 2  (two) times daily. 30 g 3  ? RESTASIS 0.05 % ophthalmic emulsion Place 1 drop into both eyes 2 (two) times daily.    ? SURE COMFORT PEN NEEDLES 32G X 4 MM MISC USE ONCE DAILY 100 each 3  ? traZODone (DESYREL) 50 MG tablet Take 1 tablet (50 mg total) by mouth at bedtime. 90 tablet 1  ? fluticasone (FLONASE) 50 MCG/ACT nasal spray Place 1 spray into both nostrils daily. 16 g 1  ? ?No facility-administered medications prior to visit.  ? ? ?No Known Allergies ? ?ROS ?Review of Systems  ?Constitutional:  Negative for chills and  fever.  ?HENT:  Positive for congestion and postnasal drip. Negative for sore throat.   ?Respiratory:  Negative for cough and shortness of breath.   ?Cardiovascular:  Negative for chest pain and palpitations.  ?Gastrointestinal:  Positive for constipation. Negative for diarrhea and vomiting.  ?Genitourinary:  Negative for dysuria and hematuria.  ?Musculoskeletal:  Negative for neck pain and neck stiffness.  ?Skin:  Negative for rash.  ?Neurological:  Negative for dizziness and weakness.  ?Psychiatric/Behavioral:  Negative for agitation and behavioral problems.   ? ?  ?Objective:  ?  ?Physical Exam ?Vitals reviewed.  ?Constitutional:   ?   General: He is not in acute distress. ?   Appearance: He is not diaphoretic.  ?HENT:  ?   Head: Normocephalic and atraumatic.  ?   Nose: Congestion present.  ?   Mouth/Throat:  ?   Mouth: Mucous membranes are moist.  ?Eyes:  ?   General: No scleral icterus. ?   Extraocular Movements: Extraocular movements intact.  ?Cardiovascular:  ?   Rate and Rhythm: Normal rate and regular rhythm.  ?   Pulses: Normal pulses.  ?   Heart sounds: Normal heart sounds. No murmur heard. ?Pulmonary:  ?   Breath sounds: Normal breath sounds. No wheezing or rales.  ?Abdominal:  ?   Palpations: Abdomen is soft.  ?   Tenderness: There is no abdominal tenderness.  ?Musculoskeletal:  ?   Cervical back: Neck supple. No tenderness.  ?   Right lower leg: No edema.  ?   Left lower leg: No  edema.  ?Skin: ?   General: Skin is warm.  ?   Findings: No rash.  ?Neurological:  ?   General: No focal deficit present.  ?   Mental Status: He is alert and oriented to person, place, and time.  ?Psychiatr

## 2022-01-19 NOTE — Patient Instructions (Addendum)
Please continue taking medications as prescribed. ? ?Please continue to follow low carb diet and perform moderate exercise/walking at least 150 mins/week. ? ?Please consider getting pneumococcal and Shingrix vaccine. ?

## 2022-01-19 NOTE — Assessment & Plan Note (Signed)
With HTN ? ?Lab Results  ?Component Value Date  ? HGBA1C 6.5 08/26/2021  ? ?Well-controlled with Lantus 14 units every morning, followed by Dr. Dorris Fetch ?Advised to follow diabetic diet ?On statin and ACEi ?F/u CMP and lipid panel ?Diabetic foot exam: Today ?Diabetic eye exam: Advised to follow up with Ophthalmology for diabetic eye exam ?

## 2022-01-21 LAB — MICROALBUMIN / CREATININE URINE RATIO
Creatinine, Urine: 111.7 mg/dL
Microalb/Creat Ratio: 29 mg/g creat (ref 0–29)
Microalbumin, Urine: 32.1 ug/mL

## 2022-01-22 DIAGNOSIS — Z1211 Encounter for screening for malignant neoplasm of colon: Secondary | ICD-10-CM | POA: Diagnosis not present

## 2022-01-30 LAB — COLOGUARD: COLOGUARD: NEGATIVE

## 2022-02-16 ENCOUNTER — Other Ambulatory Visit: Payer: Self-pay | Admitting: Internal Medicine

## 2022-02-16 DIAGNOSIS — I10 Essential (primary) hypertension: Secondary | ICD-10-CM

## 2022-02-19 ENCOUNTER — Other Ambulatory Visit: Payer: Self-pay | Admitting: Internal Medicine

## 2022-02-19 DIAGNOSIS — I10 Essential (primary) hypertension: Secondary | ICD-10-CM

## 2022-02-23 DIAGNOSIS — E1151 Type 2 diabetes mellitus with diabetic peripheral angiopathy without gangrene: Secondary | ICD-10-CM | POA: Diagnosis not present

## 2022-02-23 DIAGNOSIS — L851 Acquired keratosis [keratoderma] palmaris et plantaris: Secondary | ICD-10-CM | POA: Diagnosis not present

## 2022-02-23 DIAGNOSIS — M79674 Pain in right toe(s): Secondary | ICD-10-CM | POA: Diagnosis not present

## 2022-02-23 DIAGNOSIS — L603 Nail dystrophy: Secondary | ICD-10-CM | POA: Diagnosis not present

## 2022-02-24 ENCOUNTER — Encounter: Payer: Self-pay | Admitting: "Endocrinology

## 2022-02-24 ENCOUNTER — Ambulatory Visit (INDEPENDENT_AMBULATORY_CARE_PROVIDER_SITE_OTHER): Payer: Medicare Other | Admitting: "Endocrinology

## 2022-02-24 VITALS — BP 118/62 | HR 52 | Ht 67.5 in | Wt 131.0 lb

## 2022-02-24 DIAGNOSIS — Z794 Long term (current) use of insulin: Secondary | ICD-10-CM | POA: Diagnosis not present

## 2022-02-24 DIAGNOSIS — F17200 Nicotine dependence, unspecified, uncomplicated: Secondary | ICD-10-CM | POA: Diagnosis not present

## 2022-02-24 DIAGNOSIS — E782 Mixed hyperlipidemia: Secondary | ICD-10-CM | POA: Diagnosis not present

## 2022-02-24 DIAGNOSIS — I10 Essential (primary) hypertension: Secondary | ICD-10-CM | POA: Diagnosis not present

## 2022-02-24 DIAGNOSIS — E1169 Type 2 diabetes mellitus with other specified complication: Secondary | ICD-10-CM

## 2022-02-24 LAB — POCT GLYCOSYLATED HEMOGLOBIN (HGB A1C): HbA1c, POC (controlled diabetic range): 6.1 % (ref 0.0–7.0)

## 2022-02-24 NOTE — Progress Notes (Signed)
02/24/2022   Endocrinology follow-up note    Subjective:    Patient ID: Chase Briggs, male    DOB: July 19, 1951, PCP Lindell Spar, MD   Past Medical History:  Diagnosis Date   Diabetes mellitus    Fatty liver    GERD (gastroesophageal reflux disease)    HTN (hypertension)    Hyperlipidemia    Insomnia    Past Surgical History:  Procedure Laterality Date   ABDOMINAL AORTOGRAM W/LOWER EXTREMITY N/A 08/22/2018   Procedure: ABDOMINAL AORTOGRAM W/LOWER EXTREMITY;  Surgeon: Waynetta Sandy, MD;  Location: [facility];  Service: Cardiovascular;  Laterality: N/A;   BACK SURGERY     CARPAL TUNNEL WITH CUBITAL TUNNEL Right 03/21/2019   Procedure: RIGHT CARPAL TUNNEL AND CUBITAL  TUNNEL RELEASES;  Surgeon: Daryll Brod, MD;  Location: [place];  Service: Orthopedics;  Laterality: Right;  AXILLARY BLOCK   COLONOSCOPY  01/2007   Dr. Arnoldo Morale, reviewed report, mild diverticulosis. Prep adequate. Next TCS due 01/2017   ESOPHAGOGASTRODUODENOSCOPY N/A 10/21/2012   [id number] GASTRITIS   MASS EXCISION Right 10/26/2012   Procedure: EXCISION MASS;  Surgeon: Jamesetta So, MD;  Location: [facility];  Service: General;  Laterality: Right;   PERIPHERAL VASCULAR INTERVENTION  08/22/2018   Procedure: PERIPHERAL VASCULAR INTERVENTION;  Surgeon: Waynetta Sandy, MD;  Location: [facility];  Service: Cardiovascular;;  bilateral external iliac stents   scalp mass  4-5 yrs ago   Dr Caralyn Guile NERVE TRANSPOSITION Right 03/21/2019   Procedure: DECOMPRESSION RIGHT ULNAR NERVE;  Surgeon: Daryll Brod, MD;  Location: [facility];  Service: Orthopedics;  Laterality: Right;   Social History   Socioeconomic History   Marital status: Married    Spouse name: Not on file   Number of children: 2   Years of education: Not on file   Highest education level: Not on file  Occupational History   Occupation: reitred    Comment: [employer], heavy lifting   Tobacco Use   Smoking status: Every Day    Packs/day: 1.00    Years: 35.00    Total pack years: 35.00    Types: Cigarettes   Smokeless tobacco: Never  Vaping Use   Vaping Use: Never used  Substance and Sexual Activity   Alcohol use: No    Comment: hx heavy etoh x 3 yrs, quit 30+ yrs ago   Drug use: No   Sexual activity: Yes    Birth control/protection: None  Other Topics Concern   Not on file  Social History Narrative   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 Married for 12 years. Has 2 sons (55 and 51). Lives with wife.   Social Determinants of Health   Financial Resource Strain: Not on file  Food Insecurity: Not on file  Transportation Needs: Not on file  Physical Activity: Not on file  Stress: Not on file  Social Connections: Not on file   Outpatient Encounter Medications as of 02/24/2022  Medication Sig   amLODipine (NORVASC) 10 MG tablet Take 1 tablet (10 mg total) by mouth daily.   aspirin EC 81 MG tablet Take 81 mg by mouth daily.   atorvastatin (LIPITOR) 10 MG tablet Take 1 tablet (10 mg total) by mouth daily.   Cholecalciferol (VITAMIN D) 125 MCG (5000 UT) CAPS Take 2,000 Units by mouth daily.   cloNIDine (CATAPRES) 0.3 MG tablet TAKE 1 TABLET BY MOUTH ONCE DAILY.  clopidogrel (PLAVIX) 75 MG tablet TAKE (1) TABLET BY MOUTH ONCE DAILY.   docusate sodium (COLACE) 100 MG capsule Take 1 capsule (100 mg total) by mouth 2 (two) times daily as needed for mild constipation.   Ferrous Sulfate (IRON) 325 (65 Fe) MG TABS Take 325 mg by mouth daily.   fluticasone (FLONASE) 50 MCG/ACT nasal spray Place 1 spray into both nostrils daily. (Patient taking differently: Place 1 spray into both nostrils daily as needed.)   glucose blood test strip 1 each by Other route 2 (two) times daily. Use as instructed bid E11.65 [employer]   Lancets MISC 1 each by Does not apply route 2 (two) times daily. E11.65 [employer] Premier   LANTUS SOLOSTAR 100  UNIT/ML Solostar Pen INJECT 14 UNITS UNDER THE SKIN AT BEDTIME (Patient taking differently: 14 Units daily.)   lisinopril-hydrochlorothiazide (ZESTORETIC) 20-25 MG tablet TAKE 1 TABLET DAILY   RESTASIS 0.05 % ophthalmic emulsion Place 1 drop into both eyes 2 (two) times daily as needed.   SURE COMFORT PEN NEEDLES 32G X 4 MM MISC USE ONCE DAILY   traZODone (DESYREL) 50 MG tablet Take 1 tablet (50 mg total) by mouth at bedtime.   [DISCONTINUED] nystatin-triamcinolone (MYCOLOG II) cream Apply topically 2 (two) times daily.   No facility-administered encounter medications on file as of 02/24/2022.   ALLERGIES: No Known Allergies VACCINATION STATUS: Immunization History  Administered Date(s) Administered   Fluad Quad(high Dose 65+) 08/27/2021   Moderna Sars-Covid-2 Vaccination 01/04/2020, 02/06/2020    Diabetes He presents for his follow-up diabetic visit. He has type 2 diabetes mellitus. Onset time: He was diagnosed at approximate age of 63 years. His disease course has been improving. There are no hypoglycemic associated symptoms. Pertinent negatives for hypoglycemia include no confusion, headaches, pallor or seizures. Pertinent negatives for diabetes include no chest pain, no fatigue, no polydipsia, no polyphagia, no polyuria and no weakness. There are no hypoglycemic complications. Symptoms are improving (He recently lost control of his glycemia due to exposure to heavy dose steroids.). Risk factors for coronary artery disease include diabetes mellitus, dyslipidemia, hypertension, male sex, sedentary lifestyle and tobacco exposure. He is compliant with treatment most of the time. His weight is fluctuating minimally. He is following a generally unhealthy diet. When asked about meal planning, he reported none. He has not had a previous visit with a dietitian. His home blood glucose trend is fluctuating minimally. His breakfast blood glucose range is generally 110-130 mg/dl. His bedtime blood glucose  range is generally 140-180 mg/dl. His overall blood glucose range is 140-180 mg/dl. Chase Briggs presents with controlled glycemic profile both fasting and postprandial.  His point-of-care A1c is 6.1%, improving from 6.5%.  He did not document any major hypoglycemia.    ) An ACE inhibitor/angiotensin II receptor blocker is being taken. Eye exam is current.  Hypertension This is a chronic problem. The current episode started more than 1 year ago. The problem has been gradually improving since onset. The problem is uncontrolled. Pertinent negatives include no chest pain, headaches, neck pain, palpitations or shortness of breath. Risk factors for coronary artery disease include smoking/tobacco exposure, sedentary lifestyle, dyslipidemia, family history and diabetes mellitus. Past treatments include ACE inhibitors, calcium channel blockers and central alpha agonists. The current treatment provides no improvement. Compliance problems include psychosocial issues.  Hypertensive end-organ damage includes kidney disease. Identifiable causes of hypertension include chronic renal disease.  Hyperlipidemia This is a chronic problem. The current episode started more than 1 year ago. Exacerbating diseases include  chronic renal disease and diabetes. Pertinent negatives include no chest pain, myalgias or shortness of breath. Current antihyperlipidemic treatment includes statins. Compliance problems include psychosocial issues.  Risk factors for coronary artery disease include dyslipidemia, diabetes mellitus, male sex, hypertension and a sedentary lifestyle.     Review of Systems  Constitutional:  Negative for fatigue and unexpected weight change.  HENT:  Negative for dental problem, mouth sores and trouble swallowing.   Eyes:  Negative for visual disturbance.  Respiratory:  Negative for cough, choking, chest tightness, shortness of breath and wheezing.   Cardiovascular:  Negative for chest pain, palpitations and leg  swelling.  Gastrointestinal:  Negative for abdominal distention, abdominal pain, constipation, diarrhea, nausea and vomiting.  Endocrine: Negative for polydipsia, polyphagia and polyuria.  Genitourinary:  Negative for dysuria, flank pain, hematuria and urgency.  Musculoskeletal:  Negative for back pain, gait problem, myalgias and neck pain.  Skin:  Negative for pallor, rash and wound.  Neurological:  Negative for seizures, syncope, weakness, numbness and headaches.  Psychiatric/Behavioral: Negative.  Negative for confusion and dysphoric mood.     Objective:    BP 118/62   Pulse (!) 52   Ht 5' 7.5" (1.715 m)   Wt 131 lb (59.4 kg)   BMI 20.21 kg/m   Wt Readings from Last 3 Encounters:  02/24/22 131 lb (59.4 kg)  01/19/22 131 lb (59.4 kg)  10/25/21 128 lb (58.1 kg)     Physical Exam- Limited  Constitutional:  Body mass index is 20.21 kg/m. , not in acute distress, normal state of mind  Reluctant affect, not ready to quit smoking  Complete Blood Count (Most recent): Lab Results  Component Value Date   WBC 6.1 10/25/2021   HGB 14.2 10/25/2021   HCT 42.5 10/25/2021   MCV 97.5 10/25/2021   PLT 180 10/25/2021   Chemistry (most recent): Lab Results  Component Value Date   NA 134 (L) 10/25/2021   K 3.5 10/25/2021   CL 97 (L) 10/25/2021   CO2 28 10/25/2021   BUN 13 10/25/2021   CREATININE 1.33 (H) 10/25/2021   Diabetic Labs (most recent): Lab Results  Component Value Date   HGBA1C 6.1 02/24/2022   HGBA1C 6.5 08/26/2021   HGBA1C 6.5 02/21/2021   MICROALBUR 150 08/23/2020   MICROALBUR 9.9 08/20/2017   Lipid Panel     Component Value Date/Time   CHOL 103 02/11/2021 0808   TRIG 42 02/11/2021 0808   HDL 48 02/11/2021 0808   CHOLHDL 2.1 02/11/2021 0808   CHOLHDL 2.6 12/12/2019 0905   LDLCALC 44 02/11/2021 0808   LDLCALC 56 12/12/2019 0905     Assessment & Plan:   1. Type 2 diabetes mellitus with microalbuminuria , with long-term current use of insulin  (HCC) Chase Briggs presents with controlled glycemic profile both fasting and postprandial.  His point-of-care A1c is 6.1%, improving from 6.5%.  He did not document any major hypoglycemia.    He does not have any hypoglycemic episodes.  He is a chronic heavy smoker, remains at a high risk for more acute and chronic complications of diabetes which include CAD, CVA, CKD, retinopathy, and neuropathy. These are all discussed in detail with the patient.    Recent labs reviewed, stage 3 renal insufficiency. - I have re-counseled the patient on diet management  by adopting a carbohydrate restricted / protein rich  Diet.  - he acknowledges that there is a room for improvement in his food and drink choices. - Suggestion is made for him  to avoid simple carbohydrates  from his diet including Cakes, Sweet Desserts, Ice Cream, Soda (diet and regular), Sweet Tea, Candies, Chips, Cookies, Store Bought Juices, Alcohol in Excess of  1-2 drinks a day, Artificial Sweeteners,  Coffee Creamer, and "Sugar-free" Products, Lemonade. This will help patient to have more stable blood glucose profile and potentially avoid unintended weight gain.  - Patient is advised to stick to a routine mealtimes to eat 3 meals  a day and avoid unnecessary snacks ( to snack only to correct hypoglycemia).  - I have approached patient with the following individualized plan to manage diabetes and patient agrees.  - He has benefited from insulin initiation.   - Based on  his presentation with controlled glycemic profile, he will not need prandial insulin for now.  He is advised to continue Lantus 14 units daily at breakfast.    He is encouraged and agrees to to continue monitoring blood glucose at least twice a day-daily before breakfast and at bedtime.    - He will call clinic if he registers blood glucose readings less than 70 or greater than 200 mg/dL.  -   He has stage 3 renal insufficiency which has improved over time, will stay off of  metformin for now.   If his renal insufficiency continues, he will need consultation with nephrology.    - Patient is not a candidate for  incretin therapy, nor SGLT2 inhibitors. - Patient specific target  for A1c; LDL, HDL, Triglycerides, were discussed in detail.  2) BP/HTN:  -His blood pressure is controlled to target.  He has 3 medications including losartan, clonidine, and amlodipine.  He continues to smoke heavily.   The patient was counseled on the dangers of tobacco use, and was advised to quit.  Reviewed strategies to maximize success, including removing cigarettes and smoking materials from environment.   3) Lipids/HPL: His most recent lipid panel showed LDL controlled at 44.   He is not on statins for now.    4)  Weight/Diet: His BMI is 20.2-not a candidate for weight loss     He has maintained his gain of 20 pounds of weight.   - He would benefit from addition of more complex carbohydrates, information provided. CDE consult is requested.  5. Vitamin D deficiency  - He he is on ongoing supplement with vitamin D3 5000 units daily.    6) Chronic Care/Health Maintenance:  -Patient is on ACEI/ARB and Statin medications and encouraged to continue to follow up with Ophthalmology, Podiatrist at least yearly or according to recommendations, and advised to  Quit  smoking. I have recommended yearly flu vaccine and pneumonia vaccination at least every 5 years; moderate intensity exercise for up to 150 minutes weekly; and  sleep for at least 7 hours a day. -He is counseled extensively for smoking cessation.  - I advised patient to maintain close follow up with Lindell Spar, MD for primary care needs. - I have advised him to avoid over-the-counter pain medications to avoid additional risk for renal insufficiency.    I spent 31 minutes in the care of the patient today including review of labs from [facility], Lipids, Thyroid Function, Hematology (current and previous including abstractions  from other facilities); face-to-face time discussing  his blood glucose readings/logs, discussing hypoglycemia and hyperglycemia episodes and symptoms, medications doses, his options of short and long term treatment based on the latest standards of care / guidelines;  discussion about incorporating lifestyle medicine;  and documenting the encounter.  Please refer to Patient Instructions for Blood Glucose Monitoring and Insulin/Medications Dosing Guide"  in media tab for additional information. Please  also refer to " Patient Self Inventory" in the Media  tab for reviewed elements of pertinent patient history.  Chase Briggs participated in the discussions, expressed understanding, and voiced agreement with the above plans.  All questions were answered to his satisfaction. he is encouraged to contact clinic should he have any questions or concerns prior to his return visit.    Follow up plan: -Return in about 6 months (around 08/26/2022) for F/U with Pre-visit Labs, Meter/CGM/Logs, A1c here.  Glade Lloyd, MD Phone: [phone]  Fax: [phone]  This note was partially dictated with voice recognition software. Similar sounding words can be transcribed inadequately or may not  be corrected upon review.  02/24/2022, 8:29 AM

## 2022-03-05 ENCOUNTER — Other Ambulatory Visit: Payer: Self-pay | Admitting: Internal Medicine

## 2022-03-05 ENCOUNTER — Telehealth: Payer: Self-pay

## 2022-03-05 DIAGNOSIS — G4700 Insomnia, unspecified: Secondary | ICD-10-CM

## 2022-03-05 DIAGNOSIS — I739 Peripheral vascular disease, unspecified: Secondary | ICD-10-CM

## 2022-03-05 NOTE — Telephone Encounter (Signed)
Patient called advising he needed a refill on medication below:   Medication: nystatin cream    Pharmacy: [facility] - [place], [place] - [address]

## 2022-03-06 ENCOUNTER — Other Ambulatory Visit: Payer: Self-pay

## 2022-03-06 ENCOUNTER — Encounter: Payer: Self-pay | Admitting: Nurse Practitioner

## 2022-03-06 ENCOUNTER — Ambulatory Visit (INDEPENDENT_AMBULATORY_CARE_PROVIDER_SITE_OTHER): Payer: Medicare Other | Admitting: Nurse Practitioner

## 2022-03-06 VITALS — BP 138/68 | HR 75 | Temp 98.0°F | Ht 68.0 in | Wt 129.0 lb

## 2022-03-06 DIAGNOSIS — R319 Hematuria, unspecified: Secondary | ICD-10-CM | POA: Diagnosis not present

## 2022-03-06 DIAGNOSIS — N390 Urinary tract infection, site not specified: Secondary | ICD-10-CM | POA: Diagnosis not present

## 2022-03-06 DIAGNOSIS — N489 Disorder of penis, unspecified: Secondary | ICD-10-CM

## 2022-03-06 DIAGNOSIS — I10 Essential (primary) hypertension: Secondary | ICD-10-CM

## 2022-03-06 DIAGNOSIS — R350 Frequency of micturition: Secondary | ICD-10-CM | POA: Diagnosis not present

## 2022-03-06 LAB — POCT URINALYSIS DIP (CLINITEK)
Bilirubin, UA: NEGATIVE
Glucose, UA: NEGATIVE mg/dL
Ketones, POC UA: NEGATIVE mg/dL
Nitrite, UA: POSITIVE — AB
POC PROTEIN,UA: 100 — AB
Spec Grav, UA: 1.025 (ref 1.010–1.025)
Urobilinogen, UA: 1 E.U./dL
pH, UA: 5.5 (ref 5.0–8.0)

## 2022-03-06 MED ORDER — SULFAMETHOXAZOLE-TRIMETHOPRIM 800-160 MG PO TABS: 1.0000 | ORAL_TABLET | Freq: Two times a day (BID) | ORAL | 0 refills | Status: AC

## 2022-03-06 MED ORDER — NYSTATIN-TRIAMCINOLONE 100000-0.1 UNIT/GM-% EX CREA: TOPICAL_CREAM | Freq: Two times a day (BID) | CUTANEOUS | 0 refills | Status: DC

## 2022-03-06 NOTE — Assessment & Plan Note (Signed)
Has positive nitrates, moderate blood, trace leukocytes Start Bactrim DS 1 tablet twice daily for 14 days Urine sent to the lab for UA and culture Drink at least 64 ounces of water daily to maintain hydration Take Tylenol 650 mg every 6 hours as needed for fever

## 2022-03-06 NOTE — Telephone Encounter (Signed)
Rx sent 

## 2022-03-12 ENCOUNTER — Telehealth: Payer: Self-pay | Admitting: Nurse Practitioner

## 2022-03-12 LAB — URINE CULTURE

## 2022-03-12 NOTE — Progress Notes (Signed)
Patient should continue current dose of bactrim as ordered

## 2022-03-12 NOTE — Telephone Encounter (Signed)
Spoke with pt

## 2022-03-12 NOTE — Telephone Encounter (Signed)
Pt returning call

## 2022-03-13 ENCOUNTER — Other Ambulatory Visit: Payer: Self-pay | Admitting: "Endocrinology

## 2022-03-24 ENCOUNTER — Other Ambulatory Visit: Payer: Self-pay | Admitting: Internal Medicine

## 2022-04-15 ENCOUNTER — Other Ambulatory Visit: Payer: Self-pay

## 2022-04-15 ENCOUNTER — Telehealth: Payer: Self-pay

## 2022-04-15 MED ORDER — CLOPIDOGREL BISULFATE 75 MG PO TABS: ORAL_TABLET | ORAL | 1 refills | Status: DC

## 2022-04-15 NOTE — Telephone Encounter (Signed)
Patient need med refill  clopidogrel (PLAVIX) 75 MG tablet  Patient request 2 or 3 refills everytime patient calls says no refills.  Or request 90 day supply.  Pharmacy: [facility]

## 2022-04-15 NOTE — Telephone Encounter (Signed)
Med refilled.

## 2022-04-22 ENCOUNTER — Emergency Department (HOSPITAL_COMMUNITY): Payer: Medicare Other

## 2022-04-22 ENCOUNTER — Encounter (HOSPITAL_COMMUNITY): Payer: Self-pay

## 2022-04-22 ENCOUNTER — Observation Stay (HOSPITAL_COMMUNITY)
Admission: EM | Admit: 2022-04-22 | Discharge: 2022-04-23 | Disposition: A | Discharge status: Home / Self Care | Payer: Medicare Other | Attending: Family Medicine | Admitting: Family Medicine

## 2022-04-22 DIAGNOSIS — Z7982 Long term (current) use of aspirin: Secondary | ICD-10-CM | POA: Insufficient documentation

## 2022-04-22 DIAGNOSIS — N1831 Chronic kidney disease, stage 3a: Secondary | ICD-10-CM | POA: Diagnosis not present

## 2022-04-22 DIAGNOSIS — R911 Solitary pulmonary nodule: Secondary | ICD-10-CM | POA: Diagnosis not present

## 2022-04-22 DIAGNOSIS — E1169 Type 2 diabetes mellitus with other specified complication: Secondary | ICD-10-CM | POA: Diagnosis not present

## 2022-04-22 DIAGNOSIS — Z794 Long term (current) use of insulin: Secondary | ICD-10-CM | POA: Insufficient documentation

## 2022-04-22 DIAGNOSIS — R0789 Other chest pain: Secondary | ICD-10-CM | POA: Diagnosis present

## 2022-04-22 DIAGNOSIS — Z7902 Long term (current) use of antithrombotics/antiplatelets: Secondary | ICD-10-CM | POA: Diagnosis not present

## 2022-04-22 DIAGNOSIS — G4700 Insomnia, unspecified: Secondary | ICD-10-CM

## 2022-04-22 DIAGNOSIS — Z79899 Other long term (current) drug therapy: Secondary | ICD-10-CM | POA: Diagnosis not present

## 2022-04-22 DIAGNOSIS — I129 Hypertensive chronic kidney disease with stage 1 through stage 4 chronic kidney disease, or unspecified chronic kidney disease: Secondary | ICD-10-CM | POA: Diagnosis not present

## 2022-04-22 DIAGNOSIS — I739 Peripheral vascular disease, unspecified: Secondary | ICD-10-CM

## 2022-04-22 DIAGNOSIS — R079 Chest pain, unspecified: Secondary | ICD-10-CM | POA: Diagnosis not present

## 2022-04-22 DIAGNOSIS — R000 Tachycardia, unspecified: Principal | ICD-10-CM | POA: Insufficient documentation

## 2022-04-22 DIAGNOSIS — E785 Hyperlipidemia, unspecified: Secondary | ICD-10-CM | POA: Insufficient documentation

## 2022-04-22 DIAGNOSIS — R0602 Shortness of breath: Secondary | ICD-10-CM | POA: Diagnosis not present

## 2022-04-22 DIAGNOSIS — F17210 Nicotine dependence, cigarettes, uncomplicated: Secondary | ICD-10-CM | POA: Diagnosis not present

## 2022-04-22 DIAGNOSIS — E782 Mixed hyperlipidemia: Secondary | ICD-10-CM | POA: Diagnosis present

## 2022-04-22 DIAGNOSIS — R072 Precordial pain: Secondary | ICD-10-CM | POA: Diagnosis present

## 2022-04-22 DIAGNOSIS — I10 Essential (primary) hypertension: Secondary | ICD-10-CM | POA: Diagnosis present

## 2022-04-22 LAB — BASIC METABOLIC PANEL
Anion gap: 12 (ref 5–15)
BUN: 14 mg/dL (ref 8–23)
CO2: 26 mmol/L (ref 22–32)
Calcium: 9.9 mg/dL (ref 8.9–10.3)
Chloride: 96 mmol/L — ABNORMAL LOW (ref 98–111)
Creatinine, Ser: 1.54 mg/dL — ABNORMAL HIGH (ref 0.61–1.24)
GFR, Estimated: 48 mL/min — ABNORMAL LOW (ref >60)
Glucose, Bld: 101 mg/dL — ABNORMAL HIGH (ref 70–99)
Potassium: 3.9 mmol/L (ref 3.5–5.1)
Sodium: 134 mmol/L — ABNORMAL LOW (ref 135–145)

## 2022-04-22 LAB — CBC
HCT: 43.6 % (ref 39.0–52.0)
Hemoglobin: 15 g/dL (ref 13.0–17.0)
MCH: 32.8 pg (ref 26.0–34.0)
MCHC: 34.4 g/dL (ref 30.0–36.0)
MCV: 95.2 fL (ref 80.0–100.0)
Platelets: 178 10*3/uL (ref 150–400)
RBC: 4.58 MIL/uL (ref 4.22–5.81)
RDW: 12.6 % (ref 11.5–15.5)
WBC: 6.6 10*3/uL (ref 4.0–10.5)
nRBC: 0 % (ref 0.0–0.2)

## 2022-04-22 LAB — TROPONIN I (HIGH SENSITIVITY)
Troponin I (High Sensitivity): 7 ng/L (ref <18)
Troponin I (High Sensitivity): 5 ng/L (ref <18)

## 2022-04-22 MED ORDER — IOHEXOL 350 MG/ML SOLN
100.0000 mL | Freq: Once | INTRAVENOUS | Status: AC | PRN
  Administered 2022-04-22: 100 mL via INTRAVENOUS

## 2022-04-22 MED ORDER — ASPIRIN 81 MG PO CHEW
324.0000 mg | CHEWABLE_TABLET | Freq: Once | ORAL | Status: AC
Start: 2022-04-22 — End: 2022-04-22
  Administered 2022-04-22: 324 mg via ORAL
  Filled 2022-04-22: qty 4

## 2022-04-22 NOTE — ED Triage Notes (Signed)
Pt c/o left sided and sternal pain, along with SHOB. Pt states this started at 1530. Denies emesis, endorses occasional nausea.

## 2022-04-22 NOTE — ED Provider Notes (Cosign Needed Addendum)
[place] EMERGENCY DEPARTMENT Provider Note   CSN: [id number] Arrival date & time: 04/22/22  1626     History  Chief Complaint  Patient presents with   Chest Pain    Chase Briggs is a 71 y.o. male with a history including hypertension, type 2 diabetes, peripheral arterial disease, smoker presenting with sudden onset left-sided to midsternal chest pain and felt that his heart was "beating out of his chest" associated with shortness of breath.  He had been on his porch outdoors in the heat for about 30 minutes and had been attempting to drill a hole through some metal using an electric drill when his symptoms began.  He developed nausea without emesis with the onset of the symptoms.  There is no radiation of pain, it is still present but much milder than upon first presentation.  He went indoors and laid down and his symptoms improved but he continues to have shortness of breath and as mentioned, mild midsternal pressure.  Nausea is resolved.  He pain at this time but feels he still has a rapid heart rate.  Denies prior history of similar symptoms.  He has had no treatment prior to arrival.  The history is provided by the patient.       Home Medications Prior to Admission medications   Medication Sig Start Date End Date Taking? Authorizing Provider  amLODipine (NORVASC) 10 MG tablet TAKE (1) TABLET BY MOUTH ONCE DAILY. Patient taking differently: Take 10 mg by mouth daily. 03/24/22  Yes Lindell Spar, MD  aspirin EC 81 MG tablet Take 81 mg by mouth daily.   Yes [provider]  atorvastatin (LIPITOR) 10 MG tablet TAKE ONE TABLET BY MOUTH ONCE DAILY. 03/05/22  Yes Lindell Spar, MD  Cholecalciferol (VITAMIN D) 125 MCG (5000 UT) CAPS Take 2,000 Units by mouth daily.   Yes [provider]  cloNIDine (CATAPRES) 0.3 MG tablet TAKE 1 TABLET BY MOUTH ONCE DAILY. 02/20/22  Yes Lindell Spar, MD  clopidogrel (PLAVIX) 75 MG tablet TAKE (1) TABLET BY MOUTH ONCE DAILY.  04/15/22  Yes Lindell Spar, MD  Ferrous Sulfate (IRON) 325 (65 Fe) MG TABS Take 325 mg by mouth daily.   Yes [provider]  LANTUS SOLOSTAR 100 UNIT/ML Solostar Pen INJECT 14 UNITS UNDER THE SKIN AT BEDTIME Patient taking differently: 14 Units daily. 03/13/22  Yes Nida, Marella Chimes, MD  lisinopril-hydrochlorothiazide (ZESTORETIC) 20-25 MG tablet TAKE 1 TABLET DAILY 02/16/22  Yes Lindell Spar, MD  RESTASIS 0.05 % ophthalmic emulsion Place 1 drop into both eyes 2 (two) times daily as needed. 10/02/21  Yes [provider]  traZODone (DESYREL) 50 MG tablet TAKE ONE TABLET BY MOUTH ONCE DAILY AT BEDTIME. 03/05/22  Yes Patel, Colin Broach, MD  glucose blood test strip 1 each by Other route 2 (two) times daily. Use as instructed bid E11.65 [employer] 05/15/19   Cassandria Anger, MD  Lancets MISC 1 each by Does not apply route 2 (two) times daily. E11.65 [employer] 05/15/19   Cassandria Anger, MD  SURE COMFORT PEN NEEDLES 32G X 4 MM MISC USE ONCE DAILY 11/04/21   Cassandria Anger, MD      Allergies    Patient has no known allergies.    Review of Systems   Review of Systems  Constitutional:  Negative for chills and fever.  HENT:  Negative for congestion.   Eyes: Negative.   Respiratory:  Positive for shortness of  breath. Negative for chest tightness.   Cardiovascular:  Positive for chest pain and palpitations.  Gastrointestinal:  Positive for nausea. Negative for abdominal pain and vomiting.  Genitourinary: Negative.   Musculoskeletal:  Negative for arthralgias, joint swelling and neck pain.  Skin: Negative.  Negative for rash and wound.  Neurological:  Negative for dizziness, weakness, light-headedness, numbness and headaches.  Psychiatric/Behavioral: Negative.    All other systems reviewed and are negative.   Physical Exam Updated Vital Signs BP (!) 130/59   Pulse 100   Temp 97.9 F (36.6 C) (Oral)   Resp (!) 21   Ht 5\' 7"  (1.702 m)   Wt 57.8 kg    SpO2 96%   BMI 19.97 kg/m  Physical Exam Vitals and nursing note reviewed.  Constitutional:      Appearance: He is well-developed.  HENT:     Head: Normocephalic and atraumatic.  Eyes:     Conjunctiva/sclera: Conjunctivae normal.  Cardiovascular:     Rate and Rhythm: Regular rhythm. Tachycardia present.     Heart sounds: Normal heart sounds. No murmur heard.    Comments: Borderline tachycardia, rate currently 102 Pulmonary:     Effort: Pulmonary effort is normal.     Breath sounds: Normal breath sounds. No wheezing, rhonchi or rales.  Abdominal:     General: Bowel sounds are normal.     Palpations: Abdomen is soft.     Tenderness: There is no abdominal tenderness.  Musculoskeletal:        General: Normal range of motion.     Cervical back: Normal range of motion.     Right lower leg: No edema.     Left lower leg: No edema.  Skin:    General: Skin is warm and dry.  Neurological:     General: No focal deficit present.     Mental Status: He is alert.     ED Results / Procedures / Treatments   Labs (all labs ordered are listed, but only abnormal results are displayed) Labs Reviewed  BASIC METABOLIC PANEL - Abnormal; Notable for the following components:      Result Value   Sodium 134 (*)    Chloride 96 (*)    Glucose, Bld 101 (*)    Creatinine, Ser 1.54 (*)    GFR, Estimated 48 (*)    All other components within normal limits  CBC  TROPONIN I (HIGH SENSITIVITY)  TROPONIN I (HIGH SENSITIVITY)    EKG EKG Interpretation  Date/Time:  Wednesday April 22 2022 16:43:05 EDT Ventricular Rate:  108 PR Interval:  140 QRS Duration: 80 QT Interval:  338 QTC Calculation: 452 R Axis:   6 Text Interpretation: Sinus tachycardia Biatrial enlargement Minimal voltage criteria for LVH, may be normal variant ( Cornell product ) Abnormal ECG When compared with ECG of 28-Jan-2021 12:54, PREVIOUS ECG IS PRESENT Since last tracing rate faster Confirmed by 30-Jan-2021 [phone]) on  04/22/2022 9:09:30 PM  Radiology CT Angio Chest PE W and/or Wo Contrast  Result Date: 04/22/2022 CLINICAL DATA:  Left-sided chest and sternal pain, shortness of breath EXAM: CT ANGIOGRAPHY CHEST WITH CONTRAST TECHNIQUE: Multidetector CT imaging of the chest was performed using the standard protocol during bolus administration of intravenous contrast. Multiplanar CT image reconstructions and MIPs were obtained to evaluate the vascular anatomy. RADIATION DOSE REDUCTION: This exam was performed according to the departmental dose-optimization program which includes automated exposure control, adjustment of the mA and/or kV according to patient size and/or use of iterative reconstruction  technique. CONTRAST:  OMNIPAQUE IOHEXOL 350 MG/ML SOLN COMPARISON:  04/22/2022 FINDINGS: Cardiovascular: This is a technically adequate evaluation of the pulmonary vasculature. No filling defects or pulmonary emboli. The heart is unremarkable without pericardial effusion. No evidence of thoracic aortic aneurysm or dissection. Atherosclerosis of the aortic arch. Mediastinum/Nodes: No enlarged mediastinal, hilar, or axillary lymph nodes. Thyroid gland, trachea, and esophagus demonstrate no significant findings. Lungs/Pleura: Upper lobe predominant centrilobular emphysema. No acute airspace disease, effusion, or pneumothorax. Central airways are widely patent. There is a 3 mm solid nodule right lower lobe image 96/6. Upper Abdomen: No acute abnormality. Musculoskeletal: No acute or destructive bony lesions. Reconstructed images demonstrate no additional findings. Review of the MIP images confirms the above findings. IMPRESSION: 1. No evidence of pulmonary embolus. 2. No acute intrathoracic process. 3. 3 mm right solid pulmonary nodule. Per Fleischner Society Guidelines, no routine follow-up imaging is recommended. These guidelines do not apply to immunocompromised patients and patients with cancer. Follow up in patients with  significant comorbidities as clinically warranted. For lung cancer screening, adhere to Lung-RADS guidelines. Reference: Radiology. 2017; 284(1):228-43. 4. Aortic Atherosclerosis (ICD10-I70.0) and Emphysema (ICD10-J43.9). Electronically Signed   By: Sharlet Salina M.D.   On: 04/22/2022 22:04   DG Chest Port 1 View  Result Date: 04/22/2022 CLINICAL DATA:  Shortness of breath with chest pain. EXAM: PORTABLE CHEST 1 VIEW COMPARISON:  Radiographs 01/28/2021 and 01/22/2021. FINDINGS: 1739 hours. The heart size and mediastinal contours are normal. The lungs are clear. There is no pleural effusion or pneumothorax. No acute osseous findings are identified. Telemetry leads overlie the chest. IMPRESSION: Stable chest.  No evidence of active cardiopulmonary process. Electronically Signed   By: Carey Bullocks M.D.   On: 04/22/2022 18:03    Procedures Procedures    Medications Ordered in ED Medications  aspirin chewable tablet 324 mg (324 mg Oral Given 04/22/22 1731)  iohexol (OMNIPAQUE) 350 MG/ML injection 100 mL (100 mLs Intravenous Contrast Given 04/22/22 2143)    ED Course/ Medical Decision Making/ A&P                           Medical Decision Making Patient presenting with sudden onset left-sided chest pain along with palpitations and shortness of breath prior to arrival while working outdoors.  Differential diagnosis including acute MI, unstable angina, pulmonary source such as pneumonia, pulmonary embolism.  His delta troponins here are negative,  however he has an elevated risk for ACS with a Heart score of 6.  He will require admission for provocative testing to ensure sx are not from unstable angina.  CT angio chest is negative for PE.  Patient continues to be borderline tachycardic, he does not have chest pain presently.    Amount and/or Complexity of Data Reviewed Labs: ordered. Radiology: ordered. Discussion of management or test interpretation with external provider(s): Patient discussed  with Dr. Debby Bud who accepts for admission.  Risk OTC drugs. Prescription drug management. Decision regarding hospitalization.           Final Clinical Impression(s) / ED Diagnoses Final diagnoses:  Chest pain, unspecified type  Tachycardia    Rx / DC Orders ED Discharge Orders     None         Victoriano Lain 04/22/22 2321    Burgess Amor, PA-C 04/22/22 2322    Mancel Bale, MD 04/23/22 [phone]

## 2022-04-23 ENCOUNTER — Telehealth: Payer: Self-pay | Admitting: Cardiology

## 2022-04-23 ENCOUNTER — Other Ambulatory Visit: Payer: Self-pay

## 2022-04-23 ENCOUNTER — Observation Stay (HOSPITAL_BASED_OUTPATIENT_CLINIC_OR_DEPARTMENT_OTHER): Payer: Medicare Other

## 2022-04-23 DIAGNOSIS — R0789 Other chest pain: Secondary | ICD-10-CM | POA: Diagnosis not present

## 2022-04-23 DIAGNOSIS — I10 Essential (primary) hypertension: Secondary | ICD-10-CM

## 2022-04-23 DIAGNOSIS — R079 Chest pain, unspecified: Secondary | ICD-10-CM

## 2022-04-23 DIAGNOSIS — R000 Tachycardia, unspecified: Secondary | ICD-10-CM | POA: Diagnosis not present

## 2022-04-23 DIAGNOSIS — E785 Hyperlipidemia, unspecified: Secondary | ICD-10-CM | POA: Diagnosis not present

## 2022-04-23 DIAGNOSIS — R072 Precordial pain: Secondary | ICD-10-CM | POA: Diagnosis present

## 2022-04-23 LAB — TROPONIN I (HIGH SENSITIVITY)
Troponin I (High Sensitivity): 5 ng/L (ref <18)
Troponin I (High Sensitivity): 6 ng/L (ref <18)

## 2022-04-23 LAB — LIPID PANEL
Cholesterol: 73 mg/dL (ref 0–200)
HDL: 35 mg/dL — ABNORMAL LOW (ref >40)
LDL Cholesterol: 34 mg/dL (ref 0–99)
Total CHOL/HDL Ratio: 2.1 RATIO
Triglycerides: 21 mg/dL (ref <150)
VLDL: 4 mg/dL (ref 0–40)

## 2022-04-23 LAB — ECHOCARDIOGRAM COMPLETE
AR max vel: 2.65 cm2
AV Area VTI: 2.59 cm2
AV Area mean vel: 2.79 cm2
AV Mean grad: 3 mmHg
AV Peak grad: 5.9 mmHg
Ao pk vel: 1.21 m/s
Area-P 1/2: 2.25 cm2
Calc EF: 66.6 %
Height: 67 in
MV VTI: 2.44 cm2
S' Lateral: 2.6 cm
Single Plane A2C EF: 69.9 %
Single Plane A4C EF: 61.4 %
Weight: 2014.12 oz

## 2022-04-23 LAB — HIV ANTIBODY (ROUTINE TESTING W REFLEX): HIV Screen 4th Generation wRfx: NONREACTIVE

## 2022-04-23 MED ORDER — CLONIDINE HCL 0.1 MG PO TABS
0.3000 mg | ORAL_TABLET | Freq: Every day | ORAL | Status: DC
  Administered 2022-04-23: 0.3 mg via ORAL
  Filled 2022-04-23: qty 3

## 2022-04-23 MED ORDER — NICOTINE 14 MG/24HR TD PT24: 14.0000 mg | MEDICATED_PATCH | Freq: Every day | TRANSDERMAL | 0 refills | Status: AC

## 2022-04-23 MED ORDER — AMLODIPINE BESYLATE 5 MG PO TABS
10.0000 mg | ORAL_TABLET | Freq: Every day | ORAL | Status: DC
  Administered 2022-04-23: 10 mg via ORAL
  Filled 2022-04-23: qty 2

## 2022-04-23 MED ORDER — CLOPIDOGREL BISULFATE 75 MG PO TABS
75.0000 mg | ORAL_TABLET | Freq: Every day | ORAL | Status: DC
  Administered 2022-04-23: 75 mg via ORAL
  Filled 2022-04-23: qty 1

## 2022-04-23 MED ORDER — ENOXAPARIN SODIUM 40 MG/0.4ML IJ SOSY
40.0000 mg | PREFILLED_SYRINGE | INTRAMUSCULAR | Status: DC
  Filled 2022-04-23: qty 0.4

## 2022-04-23 MED ORDER — CLOPIDOGREL BISULFATE 75 MG PO TABS: ORAL_TABLET | ORAL | 3 refills | Status: DC

## 2022-04-23 MED ORDER — ENSURE ENLIVE PO LIQD
237.0000 mL | Freq: Two times a day (BID) | ORAL | Status: DC
  Administered 2022-04-23: 237 mL via ORAL

## 2022-04-23 MED ORDER — VITAMIN D 125 MCG (5000 UT) PO CAPS: 2000.0000 [IU] | ORAL_CAPSULE | Freq: Every day | ORAL | 3 refills | Status: AC

## 2022-04-23 MED ORDER — CLONIDINE HCL 0.1 MG PO TABS: 0.1000 mg | ORAL_TABLET | Freq: Two times a day (BID) | ORAL | 3 refills | Status: DC

## 2022-04-23 MED ORDER — TRAZODONE HCL 50 MG PO TABS: 50.0000 mg | ORAL_TABLET | Freq: Every day | ORAL | 3 refills | Status: DC

## 2022-04-23 MED ORDER — ONDANSETRON HCL 4 MG/2ML IJ SOLN: 4.0000 mg | Freq: Four times a day (QID) | INTRAMUSCULAR | Status: DC | PRN

## 2022-04-23 MED ORDER — ATORVASTATIN CALCIUM 10 MG PO TABS
10.0000 mg | ORAL_TABLET | Freq: Every day | ORAL | Status: DC
  Administered 2022-04-23: 10 mg via ORAL
  Filled 2022-04-23: qty 1

## 2022-04-23 MED ORDER — SODIUM CHLORIDE 0.45 % IV SOLN: INTRAVENOUS | Status: DC

## 2022-04-23 MED ORDER — LISINOPRIL 10 MG PO TABS: 20.0000 mg | ORAL_TABLET | Freq: Every day | ORAL | Status: DC

## 2022-04-23 MED ORDER — ASPIRIN EC 81 MG PO TBEC: 81.0000 mg | DELAYED_RELEASE_TABLET | Freq: Every day | ORAL | 11 refills | Status: AC

## 2022-04-23 MED ORDER — ASPIRIN 81 MG PO TBEC
81.0000 mg | DELAYED_RELEASE_TABLET | Freq: Every day | ORAL | Status: DC
  Administered 2022-04-23: 81 mg via ORAL
  Filled 2022-04-23: qty 1

## 2022-04-23 MED ORDER — ATORVASTATIN CALCIUM 10 MG PO TABS: 10.0000 mg | ORAL_TABLET | Freq: Every day | ORAL | 3 refills | Status: DC

## 2022-04-23 MED ORDER — AMLODIPINE BESYLATE 10 MG PO TABS: 10.0000 mg | ORAL_TABLET | Freq: Every day | ORAL | 3 refills | Status: DC

## 2022-04-23 MED ORDER — ACETAMINOPHEN 325 MG PO TABS
650.0000 mg | ORAL_TABLET | ORAL | Status: DC | PRN
Start: 2022-04-23 — End: 2022-04-23

## 2022-04-23 NOTE — Discharge Summary (Signed)
Chase Briggs, is a 71 y.o. male  DOB 12/20/1950  MRN [id number].  Admission date:  04/22/2022  Admitting Physician  Neena Rhymes, MD  Discharge Date:  04/23/2022   Primary MD  Lindell Spar, MD  Recommendations for primary care physician for things to follow:   1)Avoid ibuprofen/Advil/Aleve/Motrin/Goody Powders/Naproxen/BC powders/Meloxicam/Diclofenac/Indomethacin and other Nonsteroidal anti-inflammatory medications as these will make you more likely to bleed and can cause stomach ulcers, can also cause Kidney problems.   2)Please Stop Lisinopril/HCTZ until you recheck your BMP blood test in 1 week to see how your kidneys are doing  3)Follow up cardiologist Dr. Harl Bowie for outpatient nuclear stress test  4)Repeat BMP blood test in about 1 week or so to see how your kidneys are doing  5) complete abstinence from tobacco advised--- May use over-the-counter nicotine patch to help you quit smoking  Admission Diagnosis  Tachycardia [R00.0] Atypical chest pain [R07.89] Chest pain, unspecified type [R07.9]   Discharge Diagnosis  Tachycardia [R00.0] Atypical chest pain [R07.89] Chest pain, unspecified type [R07.9]    Principal Problem:   Atypical chest pain Active Problems:   Substernal chest pain   Essential hypertension   Type 2 diabetes mellitus with other specified complication ([place])   Mixed hyperlipidemia      Past Medical History:  Diagnosis Date   Diabetes mellitus    Fatty liver    GERD (gastroesophageal reflux disease)    HTN (hypertension)    Hyperlipidemia    Insomnia     Past Surgical History:  Procedure Laterality Date   ABDOMINAL AORTOGRAM W/LOWER EXTREMITY N/A 08/22/2018   Procedure: ABDOMINAL AORTOGRAM W/LOWER EXTREMITY;  Surgeon: Waynetta Sandy, MD;  Location: [facility];  Service: Cardiovascular;  Laterality: N/A;   BACK SURGERY     CARPAL  TUNNEL WITH CUBITAL TUNNEL Right 03/21/2019   Procedure: RIGHT CARPAL TUNNEL AND CUBITAL  TUNNEL RELEASES;  Surgeon: Daryll Brod, MD;  Location: [facility];  Service: Orthopedics;  Laterality: Right;  AXILLARY BLOCK   COLONOSCOPY  01/2007   Dr. Arnoldo Morale, reviewed report, mild diverticulosis. Prep adequate. Next TCS due 01/2017   ESOPHAGOGASTRODUODENOSCOPY N/A 10/21/2012   [id number] GASTRITIS   MASS EXCISION Right 10/26/2012   Procedure: EXCISION MASS;  Surgeon: Jamesetta So, MD;  Location: [facility];  Service: General;  Laterality: Right;   PERIPHERAL VASCULAR INTERVENTION  08/22/2018   Procedure: PERIPHERAL VASCULAR INTERVENTION;  Surgeon: Waynetta Sandy, MD;  Location: [facility];  Service: Cardiovascular;;  bilateral external iliac stents   scalp mass  4-5 yrs ago   Dr Caralyn Guile NERVE TRANSPOSITION Right 03/21/2019   Procedure: DECOMPRESSION RIGHT ULNAR NERVE;  Surgeon: Daryll Brod, MD;  Location: [facility];  Service: Orthopedics;  Laterality: Right;     HPI  from the history and physical done on the day of admission:   Chase Briggs is a 71 y/o with DM, HLD, HTN, smoker with no prior h/o cardiac issues. He reports that he  was working on his porch when he developed a rapid heart rate/heart pounding with a pressure like feeling accompanied by SOB. No radiation of pain to neck or arm, no diaphoresis. He lay down to rest but continued to be short of breath with increased heart rate.  Due to his symptoms he presents to [facility]    ED Course: Afebrile VSS except for HR 100, R 21. He wasn't in distress. VBG with 7.33/34/32, Cr 1.54 - close to bseline. Troponins 5, 7. CXR NAD. EKG without acute injury. TRH called to admit for CP obs to r/o.      Hospital Course:    Assessment and Plan:  1)Atypical chest pain --- ruled out for ACS by cardiac enzymes and EKG  -Cardiovascular risk factors include age, male gender, age, DM, HTN, HLD,  smoker. Troponin 7>>5>>5>>6 -Cardiology consult appreciated recommends outpatient stress test -Continue aspirin, Lipitor, Plavix . -LDL 34 HDL 35 total cholesterol 73 triglycerides 21   2)Type 2 diabetes mellitus with other specified complication (H[facility]) -A1c 6.1 on 02/24/2022 reflecting excellent diabetic control PTA -Continue insulin regimen   HTN-stable, continue amlodipine and clonidine  CKD 3A--- follow-up with PCP for repeat BMP blood test in 1 to 2 weeks -Avoid NSAIDs  Tobacco abuse--- smoking cessation advised, may use OTC nicotine patch   Discharge Condition: Stable, chest pain-free  Follow UP   Follow-up Information     Ellsworth Lennox, PA-C. Schedule an appointment as soon as possible for a visit.   Specialties: Advice worker, Cardiology Why: stress test with cardiology team Contact information: [address] [place] [address] [phone]                 Consults obtained -cardiology  Diet and Activity recommendation:  As advised  Discharge Instructions    Discharge Instructions     Call MD for:  difficulty breathing, headache or visual disturbances   Complete by: As directed    Call MD for:  persistant dizziness or light-headedness   Complete by: As directed    Call MD for:  persistant nausea and vomiting   Complete by: As directed    Call MD for:  severe uncontrolled pain   Complete by: As directed    Call MD for:  temperature >100.4   Complete by: As directed    Diet - low sodium heart healthy   Complete by: As directed    Diet Carb Modified   Complete by: As directed    Discharge instructions   Complete by: As directed    1)Avoid ibuprofen/Advil/Aleve/Motrin/Goody Powders/Naproxen/BC powders/Meloxicam/Diclofenac/Indomethacin and other Nonsteroidal anti-inflammatory medications as these will make you more likely to bleed and can cause stomach ulcers, can also cause Kidney problems.   2)Please Stop Lisinopril/HCTZ until you  recheck your BMP blood test in 1 week to see how your kidneys are doing  3)Follow up cardiologist Dr. Wyline Mood for outpatient nuclear stress test  4)Repeat BMP blood test in about 1 week or so to see how your kidneys are doing  5) complete abstinence from tobacco advised--- May use over-the-counter nicotine patch to help you quit smoking   Increase activity slowly   Complete by: As directed          Discharge Medications     Allergies as of 04/23/2022   No Known Allergies      Medication List     STOP taking these medications    lisinopril-hydrochlorothiazide 20-25 MG tablet Commonly known as: ZESTORETIC  TAKE these medications    amLODipine 10 MG tablet Commonly known as: NORVASC Take 1 tablet (10 mg total) by mouth daily. What changed: See the new instructions.   aspirin EC 81 MG tablet Take 1 tablet (81 mg total) by mouth daily with breakfast. What changed: when to take this   atorvastatin 10 MG tablet Commonly known as: LIPITOR Take 1 tablet (10 mg total) by mouth daily.   cloNIDine 0.1 MG tablet Commonly known as: CATAPRES Take 1 tablet (0.1 mg total) by mouth 2 (two) times daily. What changed:  medication strength how much to take when to take this   clopidogrel 75 MG tablet Commonly known as: PLAVIX TAKE (1) TABLET BY MOUTH ONCE DAILY.   glucose blood test strip 1 each by Other route 2 (two) times daily. Use as instructed bid E11.65 Relion Premier   Iron 325 (65 Fe) MG Tabs Take 325 mg by mouth daily.   Lancets Misc 1 each by Does not apply route 2 (two) times daily. E11.65 Relion Premier   Lantus SoloStar 100 UNIT/ML Solostar Pen Generic drug: insulin glargine INJECT 14 UNITS UNDER THE SKIN AT BEDTIME What changed: See the new instructions.   nicotine 14 mg/24hr patch Commonly known as: NICODERM CQ - dosed in mg/24 hours Place 1 patch (14 mg total) onto the skin daily for 28 days. To help you quit smoking   Restasis 0.05 %  ophthalmic emulsion Generic drug: cycloSPORINE Place 1 drop into both eyes 2 (two) times daily as needed.   Sure Comfort Pen Needles 32G X 4 MM Misc Generic drug: Insulin Pen Needle USE ONCE DAILY   traZODone 50 MG tablet Commonly known as: DESYREL Take 1 tablet (50 mg total) by mouth daily with breakfast.   Vitamin D 125 MCG (5000 UT) Caps Take 2,000 Units by mouth daily.        Major procedures and Radiology Reports - PLEASE review detailed and final reports for all details, in brief -   ECHOCARDIOGRAM COMPLETE  Result Date: 04/23/2022    ECHOCARDIOGRAM REPORT   Patient Name:   MAGGIE KRIPPNER Date of Exam: 04/23/2022 Medical Rec #:  [id number]            Height:       67.0 in Accession #:    [id number]           Weight:       125.9 lb Date of Birth:  [birth year]           BSA:          1.661 m Patient Age:    45 years             BP:           122/61 mmHg Patient Gender: M                    HR:           82 bpm. Exam Location:  [facility] Procedure: 2D Echo, Cardiac Doppler and Color Doppler Indications:    Chest pain  History:        Patient has no prior history of Echocardiogram examinations.                 PAD, Signs/Symptoms:Chest Pain; Risk Factors:Hypertension,                 Diabetes, Dyslipidemia and Current Smoker.  Sonographer:    Wenda Low Referring Phys: [id number] Marye Round  M STRADER IMPRESSIONS  1. Left ventricular ejection fraction, by estimation, is 60 to 65%. The left ventricle has normal function. The left ventricle has no regional wall motion abnormalities. Left ventricular diastolic parameters are consistent with Grade I diastolic dysfunction (impaired relaxation).  2. Right ventricular systolic function is normal. The right ventricular size is normal. There is mildly elevated pulmonary artery systolic pressure.  3. The mitral valve is normal in structure. Trivial mitral valve regurgitation. No evidence of mitral stenosis.  4. The aortic valve is tricuspid.  Aortic valve regurgitation is not visualized. No aortic stenosis is present.  5. The inferior vena cava is normal in size with greater than 50% respiratory variability, suggesting right atrial pressure of 3 mmHg. FINDINGS  Left Ventricle: Left ventricular ejection fraction, by estimation, is 60 to 65%. The left ventricle has normal function. The left ventricle has no regional wall motion abnormalities. The left ventricular internal cavity size was normal in size. There is  no left ventricular hypertrophy. Left ventricular diastolic parameters are consistent with Grade I diastolic dysfunction (impaired relaxation). Normal left ventricular filling pressure. Right Ventricle: The right ventricular size is normal. Right vetricular wall thickness was not well visualized. Right ventricular systolic function is normal. There is mildly elevated pulmonary artery systolic pressure. The tricuspid regurgitant velocity  is 2.88 m/s, and with an assumed right atrial pressure of 8 mmHg, the estimated right ventricular systolic pressure is [id number] mmHg. Left Atrium: Left atrial size was normal in size. Right Atrium: Right atrial size was normal in size. Pericardium: There is no evidence of pericardial effusion. Mitral Valve: The mitral valve is normal in structure. Trivial mitral valve regurgitation. No evidence of mitral valve stenosis. MV peak gradient, 4.0 mmHg. The mean mitral valve gradient is 2.0 mmHg. Tricuspid Valve: The tricuspid valve is normal in structure. Tricuspid valve regurgitation is mild . No evidence of tricuspid stenosis. Aortic Valve: The aortic valve is tricuspid. Aortic valve regurgitation is not visualized. No aortic stenosis is present. Aortic valve mean gradient measures 3.0 mmHg. Aortic valve peak gradient measures 5.9 mmHg. Aortic valve area, by VTI measures 2.59 cm. Pulmonic Valve: The pulmonic valve was not well visualized. Pulmonic valve regurgitation is not visualized. No evidence of pulmonic stenosis.  Aorta: The aortic root is normal in size and structure. Venous: The inferior vena cava is normal in size with greater than 50% respiratory variability, suggesting right atrial pressure of 3 mmHg. IAS/Shunts: No atrial level shunt detected by color flow Doppler.  LEFT VENTRICLE PLAX 2D LVIDd:         3.70 cm     Diastology LVIDs:         2.60 cm     LV e' medial:    9.79 cm/s LV PW:         0.80 cm     LV E/e' medial:  6.1 LV IVS:        0.90 cm     LV e' lateral:   10.40 cm/s LVOT diam:     2.00 cm     LV E/e' lateral: 5.7 LV SV:         66 LV SV Index:   40 LVOT Area:     3.14 cm  LV Volumes (MOD) LV vol d, MOD A2C: 48.2 ml LV vol d, MOD A4C: 36.0 ml LV vol s, MOD A2C: 14.5 ml LV vol s, MOD A4C: 13.9 ml LV SV MOD A2C:     33.7 ml LV SV MOD  A4C:     36.0 ml LV SV MOD BP:      29.0 ml RIGHT VENTRICLE RV Basal diam:  3.55 cm RV Mid diam:    2.50 cm RV S prime:     12.50 cm/s TAPSE (M-mode): 2.0 cm LEFT ATRIUM             Index        RIGHT ATRIUM           Index LA diam:        3.20 cm 1.93 cm/m   RA Area:     15.40 cm LA Vol (A2C):   18.9 ml 11.38 ml/m  RA Volume:   40.80 ml  24.56 ml/m LA Vol (A4C):   28.9 ml 17.40 ml/m LA Biplane Vol: 23.9 ml 14.39 ml/m  AORTIC VALVE                    PULMONIC VALVE AV Area (Vmax):    2.65 cm     PV Vmax:       0.76 m/s AV Area (Vmean):   2.79 cm     PV Peak grad:  2.3 mmHg AV Area (VTI):     2.59 cm AV Vmax:           121.00 cm/s AV Vmean:          77.600 cm/s AV VTI:            0.255 m AV Peak Grad:      5.9 mmHg AV Mean Grad:      3.0 mmHg LVOT Vmax:         102.00 cm/s LVOT Vmean:        68.800 cm/s LVOT VTI:          0.210 m LVOT/AV VTI ratio: 0.82  AORTA Ao Root diam: 3.10 cm MITRAL VALVE               TRICUSPID VALVE MV Area (PHT): 2.25 cm    TR Peak grad:   33.2 mmHg MV Area VTI:   2.44 cm    TR Vmax:        288.00 cm/s MV Peak grad:  4.0 mmHg MV Mean grad:  2.0 mmHg    SHUNTS MV Vmax:       1.00 m/s    Systemic VTI:  0.21 m MV Vmean:      57.7 cm/s   Systemic  Diam: 2.00 cm MV Decel Time: 337 msec MV E velocity: 59.55 cm/s MV A velocity: 77.95 cm/s MV E/A ratio:  0.76 Carlyle Dolly MD Electronically signed by Carlyle Dolly MD Signature Date/Time: 04/23/2022/1:05:04 PM    Final    CT Angio Chest PE W and/or Wo Contrast  Result Date: 04/22/2022 CLINICAL DATA:  Left-sided chest and sternal pain, shortness of breath EXAM: CT ANGIOGRAPHY CHEST WITH CONTRAST TECHNIQUE: Multidetector CT imaging of the chest was performed using the standard protocol during bolus administration of intravenous contrast. Multiplanar CT image reconstructions and MIPs were obtained to evaluate the vascular anatomy. RADIATION DOSE REDUCTION: This exam was performed according to the departmental dose-optimization program which includes automated exposure control, adjustment of the mA and/or kV according to patient size and/or use of iterative reconstruction technique. CONTRAST:  145mL OMNIPAQUE IOHEXOL 350 MG/ML SOLN COMPARISON:  04/22/2022 FINDINGS: Cardiovascular: This is a technically adequate evaluation of the pulmonary vasculature. No filling defects or pulmonary emboli. The heart is unremarkable without pericardial effusion. No evidence of thoracic  aortic aneurysm or dissection. Atherosclerosis of the aortic arch. Mediastinum/Nodes: No enlarged mediastinal, hilar, or axillary lymph nodes. Thyroid gland, trachea, and esophagus demonstrate no significant findings. Lungs/Pleura: Upper lobe predominant centrilobular emphysema. No acute airspace disease, effusion, or pneumothorax. Central airways are widely patent. There is a 3 mm solid nodule right lower lobe image 96/6. Upper Abdomen: No acute abnormality. Musculoskeletal: No acute or destructive bony lesions. Reconstructed images demonstrate no additional findings. Review of the MIP images confirms the above findings. IMPRESSION: 1. No evidence of pulmonary embolus. 2. No acute intrathoracic process. 3. 3 mm right solid pulmonary nodule. Per  Fleischner Society Guidelines, no routine follow-up imaging is recommended. These guidelines do not apply to immunocompromised patients and patients with cancer. Follow up in patients with significant comorbidities as clinically warranted. For lung cancer screening, adhere to Lung-RADS guidelines. Reference: Radiology. 2017; 284(1):228-43. 4. Aortic Atherosclerosis (ICD10-I70.0) and Emphysema (ICD10-J43.9). Electronically Signed   By: Sharlet Salina M.D.   On: 04/22/2022 22:04   DG Chest Port 1 View  Result Date: 04/22/2022 CLINICAL DATA:  Shortness of breath with chest pain. EXAM: PORTABLE CHEST 1 VIEW COMPARISON:  Radiographs 01/28/2021 and 01/22/2021. FINDINGS: 1739 hours. The heart size and mediastinal contours are normal. The lungs are clear. There is no pleural effusion or pneumothorax. No acute osseous findings are identified. Telemetry leads overlie the chest. IMPRESSION: Stable chest.  No evidence of active cardiopulmonary process. Electronically Signed   By: Carey Bullocks M.D.   On: 04/22/2022 18:03    Micro Results   Today   Subjective    Chase Briggs today has no further chest pains -Ambulating up and  down in the room with no dizziness or  shortness of breath or other concerns        -Wife at bedside, questions answered   Patient has been seen and examined prior to discharge   Objective   Blood pressure 122/61, pulse 82, temperature 98.6 F (37 C), temperature source Oral, resp. rate 16, height 5\' 7"  (1.702 m), weight 57.1 kg, SpO2 98 %.   Intake/Output Summary (Last 24 hours) at 04/23/2022 1404 Last data filed at 04/23/2022 0500 Gross per 24 hour  Intake 269.8 ml  Output --  Net 269.8 ml   Exam Gen:- Awake Alert, no acute distress  HEENT:- [place].AT, No sclera icterus Neck-Supple Neck,No JVD,.  Lungs-  CTAB , good air movement bilaterally CV- S1, S2 normal, regular Abd-  +ve B.Sounds, Abd Soft, No tenderness,    Extremity/Skin:- No  edema,   good  pulses Psych-affect is appropriate, oriented x3 Neuro-no new focal deficits, no tremors    Data Review   CBC w Diff:  Lab Results  Component Value Date   WBC 6.6 04/22/2022   HGB 15.0 04/22/2022   HGB 14.1 08/18/2021   HCT 43.6 04/22/2022   HCT 40.2 08/18/2021   PLT 178 04/22/2022   PLT 222 08/18/2021   LYMPHOPCT 31 10/25/2021   MONOPCT 8 10/25/2021   EOSPCT 1 10/25/2021   BASOPCT 1 10/25/2021    CMP:  Lab Results  Component Value Date   NA 134 (L) 04/22/2022   NA 139 08/18/2021   K 3.9 04/22/2022   CL 96 (L) 04/22/2022   CO2 26 04/22/2022   BUN 14 04/22/2022   BUN 12 08/18/2021   CREATININE 1.54 (H) 04/22/2022   CREATININE 1.25 03/19/2021   PROT 7.8 10/25/2021   PROT 7.7 08/18/2021   ALBUMIN 4.1 10/25/2021   ALBUMIN 4.5 08/18/2021  BILITOT 0.7 10/25/2021   BILITOT 0.6 08/18/2021   ALKPHOS 103 10/25/2021   AST 21 10/25/2021   ALT 13 10/25/2021  .  Total Discharge time is about 33 minutes  Roxan Hockey M.D on 04/23/2022 at 2:04 PM  Go to [email or web address] -  for contact info  [facility] - Office  [phone]

## 2022-04-23 NOTE — H&P (Signed)
History and Physical    Chase Briggs HEN:[id number] DOB: 1950-11-04 DOA: 04/22/2022  DOS: the patient was seen and examined on 04/22/2022  PCP: Anabel Halon, MD   Patient coming from: Home  I have personally briefly reviewed patient's old medical records in [facility]  Chase Briggs is a 71 y/o with DM, HLD, HTN, smoker with no prior h/o cardiac issues. He reports that he was working on his porch when he developed a rapid heart rate/heart pounding with a pressure like feeling accompanied by SOB. No radiation of pain to neck or arm, no diaphoresis. He lay down to rest but continued to be short of breath with increased heart rate.  Due to his symptoms he presents to [facility]   ED Course: Afebrile VSS except for HR 100, R 21. He wasn't in distress. VBG with 7.33/34/32, Cr 1.54 - close to bseline. Troponins 5, 7. CXR NAD. EKG without acute injury. TRH called to admit for CP obs to r/o.   Review of Systems:  Review of Systems  Constitutional:  Negative for chills, fever and weight loss.  HENT: Negative.    Eyes: Negative.   Respiratory:  Positive for cough and shortness of breath.   Cardiovascular:  Positive for chest pain and palpitations.  Gastrointestinal: Negative.   Genitourinary: Negative.   Skin: Negative.   Neurological: Negative.   Endo/Heme/Allergies: Negative.   Psychiatric/Behavioral: Negative.      Past Medical History:  Diagnosis Date   Diabetes mellitus    Fatty liver    GERD (gastroesophageal reflux disease)    HTN (hypertension)    Hyperlipidemia    Insomnia     Past Surgical History:  Procedure Laterality Date   ABDOMINAL AORTOGRAM W/LOWER EXTREMITY N/A 08/22/2018   Procedure: ABDOMINAL AORTOGRAM W/LOWER EXTREMITY;  Surgeon: [name], MD;  Location: [facility] INVASIVE CV LAB;  Service: Cardiovascular;  Laterality: N/A;   BACK SURGERY     CARPAL TUNNEL WITH CUBITAL TUNNEL Right 03/21/2019   Procedure: RIGHT CARPAL TUNNEL AND CUBITAL   TUNNEL RELEASES;  Surgeon: [name], MD;  Location: [facility];  Service: Orthopedics;  Laterality: Right;  AXILLARY BLOCK   COLONOSCOPY  01/2007   Dr. [name], reviewed report, mild diverticulosis. Prep adequate. Next TCS due 01/2017   ESOPHAGOGASTRODUODENOSCOPY N/A 10/21/2012   TIR:[id number] GASTRITIS   MASS EXCISION Right 10/26/2012   Procedure: EXCISION MASS;  Surgeon: [name], MD;  Location: [facility];  Service: General;  Laterality: Right;   PERIPHERAL VASCULAR INTERVENTION  08/22/2018   Procedure: PERIPHERAL VASCULAR INTERVENTION;  Surgeon: [name], MD;  Location: [facility] INVASIVE CV LAB;  Service: Cardiovascular;;  bilateral external iliac stents   scalp mass  4-5 yrs ago   Dr Angelique Holm NERVE TRANSPOSITION Right 03/21/2019   Procedure: DECOMPRESSION RIGHT ULNAR NERVE;  Surgeon: [name], MD;  Location: [facility];  Service: Orthopedics;  Laterality: Right;    Soc Hx - married, has two sons, 4 grandchildren. 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 He lives with his wife and is IADLs   reports that he has been smoking cigarettes. He has a 35.00 pack-year smoking history. He has never used smokeless tobacco. He reports that he does not drink alcohol and does not use drugs.  No Known Allergies  Family History  Problem Relation Age of Onset   Diabetes Mother    Diabetes Father    Diabetes Brother  Colon cancer Neg Hx    Liver disease Neg Hx     Prior to Admission medications   Medication Sig Start Date End Date Taking? Authorizing Provider  amLODipine (NORVASC) 10 MG tablet TAKE (1) TABLET BY MOUTH ONCE DAILY. Patient taking differently: Take 10 mg by mouth daily. 03/24/22  Yes Anabel Halon, MD  aspirin EC 81 MG tablet Take 81 mg by mouth daily.   Yes [provider]  atorvastatin (LIPITOR) 10 MG tablet TAKE ONE TABLET BY MOUTH ONCE DAILY. 03/05/22  Yes Anabel Halon, MD  Cholecalciferol  (VITAMIN D) 125 MCG (5000 UT) CAPS Take 2,000 Units by mouth daily.   Yes [provider]  cloNIDine (CATAPRES) 0.3 MG tablet TAKE 1 TABLET BY MOUTH ONCE DAILY. 02/20/22  Yes Anabel Halon, MD  clopidogrel (PLAVIX) 75 MG tablet TAKE (1) TABLET BY MOUTH ONCE DAILY. 04/15/22  Yes Anabel Halon, MD  Ferrous Sulfate (IRON) 325 (65 Fe) MG TABS Take 325 mg by mouth daily.   Yes [provider]  LANTUS SOLOSTAR 100 UNIT/ML Solostar Pen INJECT 14 UNITS UNDER THE SKIN AT BEDTIME Patient taking differently: 14 Units daily. 03/13/22  Yes Nida, Denman George, MD  lisinopril-hydrochlorothiazide (ZESTORETIC) 20-25 MG tablet TAKE 1 TABLET DAILY 02/16/22  Yes Anabel Halon, MD  RESTASIS 0.05 % ophthalmic emulsion Place 1 drop into both eyes 2 (two) times daily as needed. 10/02/21  Yes [provider]  traZODone (DESYREL) 50 MG tablet TAKE ONE TABLET BY MOUTH ONCE DAILY AT BEDTIME. 03/05/22  Yes Patel, Earlie Lou, MD  glucose blood test strip 1 each by Other route 2 (two) times daily. Use as instructed bid E11.65 [employer] Premier 05/15/19   Roma Kayser, MD  Lancets MISC 1 each by Does not apply route 2 (two) times daily. E11.65 Relion Premier 05/15/19   Roma Kayser, MD  SURE COMFORT PEN NEEDLES 32G X 4 MM MISC USE ONCE DAILY 11/04/21   Roma Kayser, MD    Physical Exam: Vitals:   04/22/22 2130 04/22/22 2202 04/22/22 2315 04/23/22 0101  BP: 115/64 117/72 (!) 130/59 116/64  Pulse: (!) 104 (!) 101 100 93  Resp: 19 (!) 21 (!) 21 (!) 22  Temp:      TempSrc:      SpO2: 94% 96% 96% 96%  Weight:      Height:        Physical Exam Vitals and nursing note reviewed.  Constitutional:      General: He is not in acute distress.    Appearance: He is well-developed and normal weight. He is not ill-appearing.  HENT:     Head: Normocephalic and atraumatic.  Eyes:     Extraocular Movements: Extraocular movements intact.     Pupils: Pupils are equal, round, and reactive  to light.  Neck:     Thyroid: No thyromegaly.     Vascular: No hepatojugular reflux or JVD.  Cardiovascular:     Rate and Rhythm: Normal rate and regular rhythm.     Heart sounds: Normal heart sounds. No murmur heard. Pulmonary:     Effort: Pulmonary effort is normal. Tachypnea present. No accessory muscle usage.     Breath sounds: Normal breath sounds.  Chest:     Chest wall: No tenderness.  Abdominal:     General: Bowel sounds are normal.     Palpations: Abdomen is soft.  Musculoskeletal:        General: Normal range of motion.  Cervical back: Normal range of motion and neck supple.     Right lower leg: No edema.     Left lower leg: No edema.  Skin:    General: Skin is warm and dry.  Neurological:     General: No focal deficit present.     Mental Status: He is alert and oriented to person, place, and time.  Psychiatric:        Mood and Affect: Mood normal.      Labs on Admission: I have personally reviewed following labs and imaging studies  CBC: Recent Labs  Lab 04/22/22 1716  WBC 6.6  HGB 15.0  HCT 43.6  MCV 95.2  PLT 178   Basic Metabolic Panel: Recent Labs  Lab 04/22/22 1716  NA 134*  K 3.9  CL 96*  CO2 26  GLUCOSE 101*  BUN 14  CREATININE 1.54*  CALCIUM 9.9   GFR: Estimated Creatinine Clearance: 36.5 mL/min (A) (by C-G formula based on SCr of 1.54 mg/dL (H)). Liver Function Tests: No results for input(s): "AST", "ALT", "ALKPHOS", "BILITOT", "PROT", "ALBUMIN" in the last 168 hours. No results for input(s): "LIPASE", "AMYLASE" in the last 168 hours. No results for input(s): "AMMONIA" in the last 168 hours. Coagulation Profile: No results for input(s): "INR", "PROTIME" in the last 168 hours. Cardiac Enzymes: No results for input(s): "CKTOTAL", "CKMB", "CKMBINDEX", "TROPONINI" in the last 168 hours. BNP (last 3 results) No results for input(s): "PROBNP" in the last 8760 hours. HbA1C: No results for input(s): "HGBA1C" in the last 72  hours. CBG: No results for input(s): "GLUCAP" in the last 168 hours. Lipid Profile: No results for input(s): "CHOL", "HDL", "LDLCALC", "TRIG", "CHOLHDL", "LDLDIRECT" in the last 72 hours. Thyroid Function Tests: No results for input(s): "TSH", "T4TOTAL", "FREET4", "T3FREE", "THYROIDAB" in the last 72 hours. Anemia Panel: No results for input(s): "VITAMINB12", "FOLATE", "FERRITIN", "TIBC", "IRON", "RETICCTPCT" in the last 72 hours. Urine analysis:    Component Value Date/Time   COLORURINE YELLOW 01/28/2021 1223   APPEARANCEUR HAZY (A) 01/28/2021 1223   APPEARANCEUR Clear 01/01/2021 0857   LABSPEC 1.005 01/28/2021 1223   PHURINE 6.0 01/28/2021 1223   GLUCOSEU 50 (A) 01/28/2021 1223   HGBUR SMALL (A) 01/28/2021 1223   BILIRUBINUR negative 03/06/2022 0829   BILIRUBINUR Negative 01/01/2021 0857   KETONESUR negative 03/06/2022 0829   KETONESUR NEGATIVE 01/28/2021 1223   PROTEINUR NEGATIVE 01/28/2021 1223   UROBILINOGEN 1.0 03/06/2022 0829   UROBILINOGEN 0.2 02/19/2014 1340   NITRITE Positive (A) 03/06/2022 0829   NITRITE NEGATIVE 01/28/2021 1223   LEUKOCYTESUR Trace (A) 03/06/2022 0829   LEUKOCYTESUR LARGE (A) 01/28/2021 1223    Radiological Exams on Admission: I have personally reviewed images CT Angio Chest PE W and/or Wo Contrast  Result Date: 04/22/2022 CLINICAL DATA:  Left-sided chest and sternal pain, shortness of breath EXAM: CT ANGIOGRAPHY CHEST WITH CONTRAST TECHNIQUE: Multidetector CT imaging of the chest was performed using the standard protocol during bolus administration of intravenous contrast. Multiplanar CT image reconstructions and MIPs were obtained to evaluate the vascular anatomy. RADIATION DOSE REDUCTION: This exam was performed according to the departmental dose-optimization program which includes automated exposure control, adjustment of the mA and/or kV according to patient size and/or use of iterative reconstruction technique. CONTRAST:  OMNIPAQUE IOHEXOL  350 MG/ML SOLN COMPARISON:  04/22/2022 FINDINGS: Cardiovascular: This is a technically adequate evaluation of the pulmonary vasculature. No filling defects or pulmonary emboli. The heart is unremarkable without pericardial effusion. No evidence of thoracic aortic aneurysm  or dissection. Atherosclerosis of the aortic arch. Mediastinum/Nodes: No enlarged mediastinal, hilar, or axillary lymph nodes. Thyroid gland, trachea, and esophagus demonstrate no significant findings. Lungs/Pleura: Upper lobe predominant centrilobular emphysema. No acute airspace disease, effusion, or pneumothorax. Central airways are widely patent. There is a 3 mm solid nodule right lower lobe image 96/6. Upper Abdomen: No acute abnormality. Musculoskeletal: No acute or destructive bony lesions. Reconstructed images demonstrate no additional findings. Review of the MIP images confirms the above findings. IMPRESSION: 1. No evidence of pulmonary embolus. 2. No acute intrathoracic process. 3. 3 mm right solid pulmonary nodule. Per Fleischner Society Guidelines, no routine follow-up imaging is recommended. These guidelines do not apply to immunocompromised patients and patients with cancer. Follow up in patients with significant comorbidities as clinically warranted. For lung cancer screening, adhere to Lung-RADS guidelines. Reference: Radiology. 2017; 284(1):228-43. 4. Aortic Atherosclerosis (ICD10-I70.0) and Emphysema (ICD10-J43.9). Electronically Signed   By: Sharlet Salina M.D.   On: 04/22/2022 22:04   DG Chest Port 1 View  Result Date: 04/22/2022 CLINICAL DATA:  Shortness of breath with chest pain. EXAM: PORTABLE CHEST 1 VIEW COMPARISON:  Radiographs 01/28/2021 and 01/22/2021. FINDINGS: 1739 hours. The heart size and mediastinal contours are normal. The lungs are clear. There is no pleural effusion or pneumothorax. No acute osseous findings are identified. Telemetry leads overlie the chest. IMPRESSION: Stable chest.  No evidence of active  cardiopulmonary process. Electronically Signed   By: Carey Bullocks M.D.   On: 04/22/2022 18:03    EKG: I have personally reviewed EKG: SR, increased QT, Biatrial enlargement. No change from May '22  Assessment/Plan Principal Problem:   Atypical chest pain Active Problems:   Substernal chest pain   Essential hypertension   Type 2 diabetes mellitus with other specified complication (HCC)   Mixed hyperlipidemia    Assessment and Plan: Substernal chest pain Patient with atypical chest pain, now resolved. Troponins negative. EKG w/o acute change. Patient with multiple risk factors including DM, HTN, HLD, smoker.  Plan Admit to tele observation   2nd set enzymes  F/u EKG  If r/o will need to follow up with PCP for outpatient testing.   Mixed hyperlipidemia Plan  Lipid panel at 0500  Type 2 diabetes mellitus with other specified complication (HCC) Last A1C in ssytem 6.8 in 2021  Plan A1C  Continue home regimen  Essential hypertension BP stable  Plan Continue home meds       DVT prophylaxis: Lovenox Code Status: Full Code Family Communication: deferred call to wife due to hour  Disposition Plan: home 24 hr  Consults called: none  Admission status: Observation, Telemetry bed   Illene Regulus, MD [facility] 04/23/2022, 1:24 AM

## 2022-04-23 NOTE — Assessment & Plan Note (Signed)
Last A1C in ssytem 6.8 in 2021  Plan A1C  Continue home regimen

## 2022-04-23 NOTE — Discharge Instructions (Signed)
1)Avoid ibuprofen/Advil/Aleve/Motrin/Goody Powders/Naproxen/BC powders/Meloxicam/Diclofenac/Indomethacin and other Nonsteroidal anti-inflammatory medications as these will make you more likely to bleed and can cause stomach ulcers, can also cause Kidney problems.   2)Please Stop Lisinopril/HCTZ until you recheck your BMP blood test in 1 week to see how your kidneys are doing  3)Follow up cardiologist Dr. Wyline Mood for outpatient nuclear stress test  4)Repeat BMP blood test in about 1 week or so to see how your kidneys are doing  5) complete abstinence from tobacco advised--- May use over-the-counter nicotine patch to help you quit smoking

## 2022-04-23 NOTE — Assessment & Plan Note (Signed)
BP stable  Plan Continue home meds 

## 2022-04-23 NOTE — Telephone Encounter (Signed)
-----   Message from Chase Briggs, [place] sent at 04/23/2022  1:23 PM EDT ----- Regarding: Outpatient Exercise Myoview This patient needs an outpatient Exercise Myoview in 1-2 weeks per Dr. Wyline Mood for chest pain. Being discharged from the hospital today. Will need to be called with instructions and appt date/time.   Thanks,  [place]

## 2022-04-23 NOTE — Assessment & Plan Note (Signed)
Plan  Lipid panel at 0500

## 2022-04-23 NOTE — Consult Note (Addendum)
Cardiology Consultation:   Patient ID: Chase Briggs MRN: [id number]; DOB: [birth year]  Admit date: 04/22/2022 Date of Consult: 04/23/2022  PCP:  Anabel Halon, MD   [facility] Providers Cardiologist:  Dina Rich, MD        Patient Profile:   Chase Briggs is a 71 y.o. male with a hx of HTN, HLD, Type 2 DM, PAD (s/p prior stenting to bilateral iliacs) and tobacco use who is being seen 04/23/2022 for the evaluation of chest pain at the request of Dr. Mariea Clonts.  History of Present Illness:   Chase Briggs was last examined by Dr. Wyline Mood in 04/2021 and had previously experienced episodes of chest pain at rest which would improve with rubbing the area but denied any recurrent symptoms at that time and remained active at baseline. Given no recurrence, ischemic evaluation was not pursued.   He presented to [facility] ED on 04/22/2022 for evaluation of chest pain with associated dyspnea. In talking with the patient today, he reports he is not overly active at baseline but is typically able to do routine yard work without any chest pain or dyspnea on exertion. Yesterday, he developed chest discomfort while drilling on his back porch and he reports this was located along his sternal region and lasted for a few minutes and spontaneously resolved. He continued to have dyspnea which prompted him to come in for evaluation. Denies any episodes of chest pain prior to this. He has been pain-free overnight and this morning. No recent orthopnea, PND or pitting edema. He does continue to smoke approximately 1 pack/day.  Initial labs show WBC 6.6, Hgb 15.0, platelets 178, Na+ 134, K+ 3.9 and creatinine 1.54 (baseline 1.2 - 1.3). Initial and repeat Hs Troponin values have been negative at 7, 5, 5 and 6. FLP shows total cholesterol of 73, triglycerides 21, HDL 35 and LDL 34. CXR showed no acute cardiopulmonary abnormalities. CTA Chest showed no evidence of a PE or acute intrathoracic  abnormalities. Was noted to have aortic atherosclerosis, emphysema and a 3 mm right solid pulmonary nodule. EKG shows sinus tachycardia, HR 108 and LVH with nonspecific ST abnormalities along the inferior leads.  He was kept NPO overnight in case he required further testing this morning but his wife did bring him food from [employer] and he has already consumed some biscuits and gravy and sweet tea.  Past Medical History:  Diagnosis Date   Diabetes mellitus    Fatty liver    GERD (gastroesophageal reflux disease)    HTN (hypertension)    Hyperlipidemia    Insomnia     Past Surgical History:  Procedure Laterality Date   ABDOMINAL AORTOGRAM W/LOWER EXTREMITY N/A 08/22/2018   Procedure: ABDOMINAL AORTOGRAM W/LOWER EXTREMITY;  Surgeon: [name], MD;  Location: [facility] INVASIVE CV LAB;  Service: Cardiovascular;  Laterality: N/A;   BACK SURGERY     CARPAL TUNNEL WITH CUBITAL TUNNEL Right 03/21/2019   Procedure: RIGHT CARPAL TUNNEL AND CUBITAL  TUNNEL RELEASES;  Surgeon: [name], MD;  Location: [facility];  Service: Orthopedics;  Laterality: Right;  AXILLARY BLOCK   COLONOSCOPY  01/2007   Dr. [name], reviewed report, mild diverticulosis. Prep adequate. Next TCS due 01/2017   ESOPHAGOGASTRODUODENOSCOPY N/A 10/21/2012   [id number] GASTRITIS   MASS EXCISION Right 10/26/2012   Procedure: EXCISION MASS;  Surgeon: [name], MD;  Location: [facility];  Service: General;  Laterality: Right;   PERIPHERAL VASCULAR INTERVENTION  08/22/2018   Procedure: PERIPHERAL VASCULAR  INTERVENTION;  Surgeon: Waynetta Sandy, MD;  Location: [place] CV [facility];  Service: Cardiovascular;;  bilateral external iliac stents   scalp mass  4-5 yrs ago   Dr Caralyn Guile NERVE TRANSPOSITION Right 03/21/2019   Procedure: DECOMPRESSION RIGHT ULNAR NERVE;  Surgeon: Daryll Brod, MD;  Location: [place];  Service: Orthopedics;  Laterality: Right;     Home Medications:   Prior to Admission medications   Medication Sig Start Date End Date Taking? Authorizing Provider  amLODipine (NORVASC) 10 MG tablet TAKE (1) TABLET BY MOUTH ONCE DAILY. Patient taking differently: Take 10 mg by mouth daily. 03/24/22  Yes Lindell Spar, MD  aspirin EC 81 MG tablet Take 81 mg by mouth daily.   Yes [provider]  atorvastatin (LIPITOR) 10 MG tablet TAKE ONE TABLET BY MOUTH ONCE DAILY. 03/05/22  Yes Lindell Spar, MD  Cholecalciferol (VITAMIN D) 125 MCG (5000 UT) CAPS Take 2,000 Units by mouth daily.   Yes [provider]  cloNIDine (CATAPRES) 0.3 MG tablet TAKE 1 TABLET BY MOUTH ONCE DAILY. 02/20/22  Yes Lindell Spar, MD  clopidogrel (PLAVIX) 75 MG tablet TAKE (1) TABLET BY MOUTH ONCE DAILY. 04/15/22  Yes Lindell Spar, MD  Ferrous Sulfate (IRON) 325 (65 Fe) MG TABS Take 325 mg by mouth daily.   Yes [provider]  LANTUS SOLOSTAR 100 UNIT/ML Solostar Pen INJECT 14 UNITS UNDER THE SKIN AT BEDTIME Patient taking differently: 14 Units daily. 03/13/22  Yes Nida, Marella Chimes, MD  lisinopril-hydrochlorothiazide (ZESTORETIC) 20-25 MG tablet TAKE 1 TABLET DAILY 02/16/22  Yes Lindell Spar, MD  RESTASIS 0.05 % ophthalmic emulsion Place 1 drop into both eyes 2 (two) times daily as needed. 10/02/21  Yes [provider]  traZODone (DESYREL) 50 MG tablet TAKE ONE TABLET BY MOUTH ONCE DAILY AT BEDTIME. 03/05/22  Yes Patel, Colin Broach, MD  glucose blood test strip 1 each by Other route 2 (two) times daily. Use as instructed bid E11.65 [employer] Premier 05/15/19   Cassandria Anger, MD  Lancets MISC 1 each by Does not apply route 2 (two) times daily. E11.65 Relion Premier 05/15/19   Cassandria Anger, MD  SURE COMFORT PEN NEEDLES 32G X 4 MM MISC USE ONCE DAILY 11/04/21   Cassandria Anger, MD    Inpatient Medications: Scheduled Meds:  aspirin EC  81 mg Oral Q breakfast   clopidogrel  75 mg Oral Daily   enoxaparin (LOVENOX) injection  40 mg  Subcutaneous Q24H   feeding supplement  237 mL Oral BID BM   Continuous Infusions:  sodium chloride 50 mL/hr at 04/23/22 0220   PRN Meds: acetaminophen, ondansetron (ZOFRAN) IV  Allergies:   No Known Allergies  Social History:   Social History   Socioeconomic History   Marital status: Married    Spouse name: Not on file   Number of children: 2   Years of education: Not on file   Highest education level: Not on file  Occupational History   Occupation: reitred    Comment: [employer], heavy lifting  Tobacco Use   Smoking status: Every Day    Packs/day: 1.00    Years: 35.00    Total pack years: 35.00    Types: Cigarettes   Smokeless tobacco: Never  Vaping Use   Vaping Use: Never used  Substance and Sexual Activity   Alcohol use: No    Comment: hx heavy etoh x 3 yrs, quit 30+ yrs ago  Drug use: No   Sexual activity: Yes    Birth control/protection: None  Other Topics Concern   Not on file  Social History Narrative   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 Married for 12 years. Has 2 sons (94 and 56). Lives with wife.   Social Determinants of Health   Financial Resource Strain: Not on file  Food Insecurity: Not on file  Transportation Needs: Not on file  Physical Activity: Not on file  Stress: Not on file  Social Connections: Not on file  Intimate Partner Violence: Not on file    Family History:    Family History  Problem Relation Age of Onset   Diabetes Mother    Diabetes Father    Diabetes Brother    Colon cancer Neg Hx    Liver disease Neg Hx      ROS:  Please see the history of present illness.   All other ROS reviewed and negative.     Physical Exam/Data:   Vitals:   04/22/22 2315 04/23/22 0101 04/23/22 0216 04/23/22 0746  BP: (!) 130/59 116/64 119/84 122/61  Pulse: 100 93 61 82  Resp: (!) 21 (!) 22 18 16   Temp:   98.5 F (36.9 C) 98.6 F (37 C)  TempSrc:    Oral  SpO2: 96% 96% 99% 98%  Weight:   57.1  kg   Height:        Intake/Output Summary (Last 24 hours) at 04/23/2022 0806 Last data filed at 04/23/2022 0500 Gross per 24 hour  Intake 269.8 ml  Output --  Net 269.8 ml      04/23/2022    2:16 AM 04/22/2022    4:39 PM 03/06/2022    8:06 AM  Last 3 Weights  Weight (lbs) 125 lb 14.1 oz 127 lb 8 oz 129 lb  Weight (kg) 57.1 kg 57.834 kg 58.514 kg     Body mass index is 19.72 kg/m.  General: Thin male appearing in no acute distress. HEENT: normal Neck: no JVD Vascular: No carotid bruits; Distal pulses 2+ bilaterally Cardiac:  normal S1, S2; RRR; no murmur Lungs:  clear to auscultation bilaterally, no wheezing, rhonchi or rales  Abd: soft, nontender, no hepatomegaly  Ext: no pitting edema Musculoskeletal:  No deformities, BUE and BLE strength normal and equal Skin: warm and dry  Neuro:  CNs 2-12 intact, no focal abnormalities noted Psych:  Normal affect   EKG:  The EKG was personally reviewed and demonstrates: Sinus tachycardia, HR 108 and LVH with nonspecific ST abnormalities along the inferior leads.  Telemetry:  Telemetry was personally reviewed and demonstrates: NSR, HR in 70's to 90's with occasional PAC's and PVC's.   Relevant CV Studies:  Echocardiogram: Pending  Laboratory Data:  High Sensitivity Troponin:   Recent Labs  Lab 04/22/22 1716 04/22/22 1836 04/23/22 0142 04/23/22 0523  TROPONINIHS 7 5 5 6      Chemistry Recent Labs  Lab 04/22/22 1716  NA 134*  K 3.9  CL 96*  CO2 26  GLUCOSE 101*  BUN 14  CREATININE 1.54*  CALCIUM 9.9  GFRNONAA 48*  ANIONGAP 12    No results for input(s): "PROT", "ALBUMIN", "AST", "ALT", "ALKPHOS", "BILITOT" in the last 168 hours. Lipids  Recent Labs  Lab 04/23/22 0142  CHOL 73  TRIG 21  HDL 35*  LDLCALC 34  CHOLHDL 2.1    Hematology Recent Labs  Lab 04/22/22 1716  WBC 6.6  RBC 4.58  HGB 15.0  HCT 43.6  MCV 95.2  MCH 32.8  MCHC 34.4  RDW 12.6  PLT 178   Thyroid No results for input(s): "TSH",  "FREET4" in the last 168 hours.  BNPNo results for input(s): "BNP", "PROBNP" in the last 168 hours.  DDimer No results for input(s): "DDIMER" in the last 168 hours.   Radiology/Studies:  CT Angio Chest PE W and/or Wo Contrast  Result Date: 04/22/2022 CLINICAL DATA:  Left-sided chest and sternal pain, shortness of breath EXAM: CT ANGIOGRAPHY CHEST WITH CONTRAST TECHNIQUE: Multidetector CT imaging of the chest was performed using the standard protocol during bolus administration of intravenous contrast. Multiplanar CT image reconstructions and MIPs were obtained to evaluate the vascular anatomy. RADIATION DOSE REDUCTION: This exam was performed according to the departmental dose-optimization program which includes automated exposure control, adjustment of the mA and/or kV according to patient size and/or use of iterative reconstruction technique. CONTRAST:  OMNIPAQUE IOHEXOL 350 MG/ML SOLN COMPARISON:  04/22/2022 FINDINGS: Cardiovascular: This is a technically adequate evaluation of the pulmonary vasculature. No filling defects or pulmonary emboli. The heart is unremarkable without pericardial effusion. No evidence of thoracic aortic aneurysm or dissection. Atherosclerosis of the aortic arch. Mediastinum/Nodes: No enlarged mediastinal, hilar, or axillary lymph nodes. Thyroid gland, trachea, and esophagus demonstrate no significant findings. Lungs/Pleura: Upper lobe predominant centrilobular emphysema. No acute airspace disease, effusion, or pneumothorax. Central airways are widely patent. There is a 3 mm solid nodule right lower lobe image 96/6. Upper Abdomen: No acute abnormality. Musculoskeletal: No acute or destructive bony lesions. Reconstructed images demonstrate no additional findings. Review of the MIP images confirms the above findings. IMPRESSION: 1. No evidence of pulmonary embolus. 2. No acute intrathoracic process. 3. 3 mm right solid pulmonary nodule. Per Fleischner Society Guidelines, no  routine follow-up imaging is recommended. These guidelines do not apply to immunocompromised patients and patients with cancer. Follow up in patients with significant comorbidities as clinically warranted. For lung cancer screening, adhere to Lung-RADS guidelines. Reference: Radiology. 2017; 284(1):228-43. 4. Aortic Atherosclerosis (ICD10-I70.0) and Emphysema (ICD10-J43.9). Electronically Signed   By: Sharlet Salina M.D.   On: 04/22/2022 22:04   DG Chest Port 1 View  Result Date: 04/22/2022 CLINICAL DATA:  Shortness of breath with chest pain. EXAM: PORTABLE CHEST 1 VIEW COMPARISON:  Radiographs 01/28/2021 and 01/22/2021. FINDINGS: 1739 hours. The heart size and mediastinal contours are normal. The lungs are clear. There is no pleural effusion or pneumothorax. No acute osseous findings are identified. Telemetry leads overlie the chest. IMPRESSION: Stable chest.  No evidence of active cardiopulmonary process. Electronically Signed   By: Carey Bullocks M.D.   On: 04/22/2022 18:03     Assessment and Plan:   1. Chest Pain with Mixed Features - Presented with chest pain which occurred while using a drill and pain lasted for minutes and then resolved. No recurrence since. Denies any exertional chest pain or dyspnea on exertion prior to this.  - Initial and repeat Hs Troponin values have been negative at 7, 5, 5 and 6. EKG shows sinus tachycardia, HR 108 and LVH with nonspecific ST abnormalities along the inferior leads.  - His story has mixed features but he does have multiple cardiac risk factors (HTN, HLD, Type 2 DM, PAD and tobacco use). He was kept NPO in case he required stress testing but family members brought him breakfast. Will order an echocardiogram to assess for any structural abnormalities. If this is without significant abnormalities, can likely plan for outpatient ischemic evaluation  with a Lexiscan Myoview or Coronary CT (would need high-dose BB given HR in the 90's) as he does not wish to  remain inpatient for this. We reviewed that if his echo shows abnormalities, then further inpatient testing would be warranted.   2. HTN - His BP has been at 115/64 - 138/79 since admission. Continue PTA Amlodipine, Lisinopril and Clonidine. Would hold HCTZ for now given his renal function.   3. HLD - FLP shows total cholesterol of 73, triglycerides 21, HDL 35 and LDL 34. Continue Atorvastatin 10mg  daily.   4. PAD - Followed by Vascular Surgery as an outpatient as he previously underwent stenting to bilateral iliacs. Remains on ASA, Plavix and statin therapy.   5. Tobacco Use/Emphysema - He does continue to smoke 1 ppd and cessation has been advised. CTA this admission was negative for a PE but did show emphysema and a 3 mm right solid pulmonary nodule. Will need to be followed by his PCP as an outpatient.   6. Stage 3 CKD - Baseline creatinine 1.2 - 1.3. Elevated to 1.54 today. He has been receiving IV fluids and HCTZ has been held. He reports not consuming a lot of fluids routinely so may need to consider stopping HCTZ at discharge given his risk for dehydration.    For questions or updates, please contact [facility] Please consult [email or web address] for contact info under    Signed, , PA-C  04/23/2022 8:06 AM  Attending note Patient seen an discussed with PA 06/23/2022, I agree with her documentation. 71 yo male history of PAD, CKD 3, HTN, HL, presents with chest pain. Reports isolated episode while using a drill. Tightness mid to left chest 6/10 in severity with some SOb and nausea. Reports symptoms lasted about 45 min, no recurrence. No prior episodes, denies any recent DOE.    K 3.9 Cr 1.54 BUN 14 WBC 6.6 Hgb 15 Plt 178  Trop 7-->5-->5-->6 CXR no acute process CT PE no PE EKG SR, BAE, no ischemic changes  Patient presents with isolated episode of chest pain. No objective evidence of ischemia by EKG or enzymes, echo is pending. Despite being npo he ate breakfast  from what was brought in by a family member this AM, does not wish to stay longer in the hospital. I think reasonable to plan on ourpatient nuclear stress if echo is benign. Will f/u echo results   66 MD

## 2022-04-23 NOTE — Subjective & Objective (Signed)
Chase Briggs is a 71 y/o with DM, HLD, HTN, smoker with no prior h/o cardiac issues. He reports that he was working on his porch when he developed a rapid heart rate/heart pounding with a pressure like feeling accompanied by SOB. No radiation of pain to neck or arm, no diaphoresis. He lay down to rest but continued to be short of breath with increased heart rate.  Due to his symptoms he presents to [facility]

## 2022-04-23 NOTE — Progress Notes (Signed)
*  PRELIMINARY RESULTS* Echocardiogram 2D Echocardiogram has been performed.  Carolyne Fiscal 04/23/2022, 11:47 AM

## 2022-04-23 NOTE — Progress Notes (Signed)
  Transition of Care (TOC) Screening Note   Patient Details  Name: Chase Briggs Date of Birth: April 15, 1951   Transition [facility]) CM/SW Contact:    Villa Herb, LCSWA Phone Number: 04/23/2022, 11:08 AM    Transition of Care Department [facility]) has reviewed patient and no TOC needs have been identified at this time. We will continue to monitor patient advancement through interdisciplinary progression rounds. If new patient transition needs arise, please place a TOC consult.

## 2022-04-23 NOTE — Telephone Encounter (Signed)
Lexiscan ordered per Dr. Kerin Perna.  Left a message for patient to call office back regarding instructions for stress test. Letter in MyChart.

## 2022-04-23 NOTE — Assessment & Plan Note (Addendum)
Patient with atypical chest pain, now resolved. Troponins negative. EKG w/o acute change. Patient with multiple risk factors including DM, HTN, HLD, smoker.  Plan Admit to tele observation   2nd set enzymes  F/u EKG  If r/o will need to follow up with PCP for outpatient testing.

## 2022-04-23 NOTE — Telephone Encounter (Signed)
   Pt's wife said, pt was in the hospital and was seen by Dr. Wyline Mood. Pt was discharge today and was old pt needs a stress test by Dr. Wyline Mood. No order on file yet

## 2022-04-24 ENCOUNTER — Telehealth: Payer: Self-pay

## 2022-04-24 ENCOUNTER — Telehealth: Payer: Self-pay | Admitting: Cardiology

## 2022-04-24 NOTE — Telephone Encounter (Signed)
Checking percert on the following patient for testing scheduled at [facility].    LEXISCAN    04/28/2022

## 2022-04-24 NOTE — Telephone Encounter (Signed)
Transition Care Management Follow-up Telephone Call Date of discharge and from where: 04/23/22 [place] How have you been since you were released from the hospital? OK  Any questions or concerns? No  Items Reviewed: Did the pt receive and understand the discharge instructions provided? Yes  Medications obtained and verified? Yes  Other?    Any new allergies since your discharge? No  Dietary orders reviewed? Yes Do you have support at home? Yes   Home Care and Equipment/Supplies: Were home health services ordered? no If so, what is the name of the agency? N/A  Has the agency set up a time to come to the patient's home? not applicable Were any new equipment or medical supplies ordered?  No What is the name of the medical supply agency? N/A Were you able to get the supplies/equipment? not applicable Do you have any questions related to the use of the equipment or supplies? No  Functional Questionnaire: (I = Independent and D = Dependent) ADLs: I  Bathing/Dressing- I  Meal Prep- I  Eating- I  Maintaining continence- I  Transferring/Ambulation- I  Managing Meds- I  Follow up appointments reviewed:  PCP Hospital f/u appt confirmed? Yes  Scheduled to see Allena Katz on 04/30/22 @ 1:40. Specialist Hospital f/u appt confirmed? no Are transportation arrangements needed? No  If their condition worsens, is the pt aware to call PCP or go to the Emergency Dept.? Yes Was the patient provided with contact information for the PCP's office or ED? Yes Was to pt encouraged to call back with questions or concerns? Yes

## 2022-04-28 ENCOUNTER — Encounter (HOSPITAL_COMMUNITY): Payer: Self-pay

## 2022-04-28 ENCOUNTER — Ambulatory Visit (HOSPITAL_COMMUNITY)
Admission: RE | Admit: 2022-04-28 | Discharge: 2022-04-28 | Disposition: A | Discharge status: Home / Self Care | Payer: Medicare Other | Source: Ambulatory Visit | Attending: Cardiology | Admitting: Cardiology

## 2022-04-28 ENCOUNTER — Encounter (HOSPITAL_BASED_OUTPATIENT_CLINIC_OR_DEPARTMENT_OTHER)
Admission: RE | Admit: 2022-04-28 | Discharge: 2022-04-28 | Disposition: A | Discharge status: Home / Self Care | Payer: Medicare Other | Source: Ambulatory Visit | Attending: Cardiology | Admitting: Cardiology

## 2022-04-28 DIAGNOSIS — R079 Chest pain, unspecified: Secondary | ICD-10-CM

## 2022-04-28 HISTORY — DX: Disorder of kidney and ureter, unspecified: N28.9

## 2022-04-28 LAB — NM MYOCAR MULTI W/SPECT W/WALL MOTION / EF
LV dias vol: 70 mL (ref 62–150)
LV sys vol: 34 mL
Nuc Stress EF: 51 %
Peak HR: 105 {beats}/min
RATE: 0.3
Rest HR: 64 {beats}/min
Rest Nuclear Isotope Dose: 11 mCi
SDS: 2
SRS: 0
SSS: 2
ST Depression (mm): 0 mm
Stress Nuclear Isotope Dose: 31.8 mCi
TID: 1.01

## 2022-04-28 MED ORDER — TECHNETIUM TC 99M TETROFOSMIN IV KIT
30.0000 | PACK | Freq: Once | INTRAVENOUS | Status: AC | PRN
  Administered 2022-04-28: 31.8 via INTRAVENOUS

## 2022-04-28 MED ORDER — TECHNETIUM TC 99M TETROFOSMIN IV KIT
10.0000 | PACK | Freq: Once | INTRAVENOUS | Status: AC | PRN
  Administered 2022-04-28: 11 via INTRAVENOUS

## 2022-04-28 MED ORDER — SODIUM CHLORIDE FLUSH 0.9 % IV SOLN
INTRAVENOUS | Status: AC
  Administered 2022-04-28: 10 mL via INTRAVENOUS
  Filled 2022-04-28: qty 10

## 2022-04-28 MED ORDER — REGADENOSON 0.4 MG/5ML IV SOLN
INTRAVENOUS | Status: AC
  Administered 2022-04-28: 0.4 mg via INTRAVENOUS
  Filled 2022-04-28: qty 5

## 2022-04-30 ENCOUNTER — Encounter: Payer: Self-pay | Admitting: Internal Medicine

## 2022-04-30 ENCOUNTER — Ambulatory Visit (INDEPENDENT_AMBULATORY_CARE_PROVIDER_SITE_OTHER): Payer: Medicare Other | Admitting: Internal Medicine

## 2022-04-30 VITALS — BP 138/60 | HR 69 | Resp 16 | Ht 67.5 in | Wt 130.6 lb

## 2022-04-30 DIAGNOSIS — I10 Essential (primary) hypertension: Secondary | ICD-10-CM

## 2022-04-30 DIAGNOSIS — R0789 Other chest pain: Secondary | ICD-10-CM | POA: Diagnosis not present

## 2022-04-30 DIAGNOSIS — Z09 Encounter for follow-up examination after completed treatment for conditions other than malignant neoplasm: Secondary | ICD-10-CM

## 2022-04-30 NOTE — Progress Notes (Signed)
Established Patient Office Visit  Subjective:  Patient ID: Chase Briggs, male    DOB: 1951/06/02  Age: 71 y.o. MRN: [id number]  CC:  Chief Complaint  Patient presents with   Transitions Of Care    Toc discharged 04-23-22 was out in heat and heart started racing patient has been doing ok since and staying out of heat and drinking plenty of fluids    HPI Chase Briggs is a 71 y.o. male with past medical history of HTN, PAD, allergic rhinitis, type II DM, HLD, insomnia, anemia and tobacco abuse who presents for f/u after recent hospitalization.  Hospital visit summary: He reports that he was working on his porch when he developed a rapid heart rate/heart pounding with a pressure like feeling accompanied by SOB. No radiation of pain to neck or arm, no diaphoresis. He lay down to rest but continued to be short of breath with increased heart rate.  He was admitted for atypical chest pain. His cardiac enzymes were WNL.  His episode of chest pain/palpitations was thought to be due to dehydration.  He has had nuclear stress test since being discharged, which was unremarkable for major ischemia.  It did show PACs.  He has been feeling better since being discharged. BP is well-controlled. Takes medications regularly. Patient denies headache, dizziness, chest pain, dyspnea or palpitations.     Past Medical History:  Diagnosis Date   Diabetes mellitus    Fatty liver    GERD (gastroesophageal reflux disease)    HTN (hypertension)    Hyperlipidemia    Insomnia    Renal insufficiency     Past Surgical History:  Procedure Laterality Date   ABDOMINAL AORTOGRAM W/LOWER EXTREMITY N/A 08/22/2018   Procedure: ABDOMINAL AORTOGRAM W/LOWER EXTREMITY;  Surgeon: Waynetta Sandy, MD;  Location: [place] [facility];  Service: Cardiovascular;  Laterality: N/A;   BACK SURGERY     CARPAL TUNNEL WITH CUBITAL TUNNEL Right 03/21/2019   Procedure: RIGHT CARPAL TUNNEL AND CUBITAL  TUNNEL  RELEASES;  Surgeon: Daryll Brod, MD;  Location: [address];  Service: Orthopedics;  Laterality: Right;  AXILLARY BLOCK   COLONOSCOPY  01/2007   Dr. Arnoldo Morale, reviewed report, mild diverticulosis. Prep adequate. Next TCS due 01/2017   ESOPHAGOGASTRODUODENOSCOPY N/A 10/21/2012   [id number] GASTRITIS   MASS EXCISION Right 10/26/2012   Procedure: EXCISION MASS;  Surgeon: Jamesetta So, MD;  Location: [facility];  Service: General;  Laterality: Right;   PERIPHERAL VASCULAR INTERVENTION  08/22/2018   Procedure: PERIPHERAL VASCULAR INTERVENTION;  Surgeon: Waynetta Sandy, MD;  Location: [facility];  Service: Cardiovascular;;  bilateral external iliac stents   scalp mass  4-5 yrs ago   Dr Caralyn Guile NERVE TRANSPOSITION Right 03/21/2019   Procedure: DECOMPRESSION RIGHT ULNAR NERVE;  Surgeon: Daryll Brod, MD;  Location: [place];  Service: Orthopedics;  Laterality: Right;    Family History  Problem Relation Age of Onset   Diabetes Mother    Diabetes Father    Diabetes Brother    Colon cancer Neg Hx    Liver disease Neg Hx     Social History   Socioeconomic History   Marital status: Married    Spouse name: Not on file   Number of children: 2   Years of education: Not on file   Highest education level: Not on file  Occupational History   Occupation: reitred    Comment: [employer], heavy lifting  Tobacco Use  Smoking status: Every Day    Packs/day: 1.00    Years: 35.00    Total pack years: 35.00    Types: Cigarettes   Smokeless tobacco: Never  Vaping Use   Vaping Use: Never used  Substance and Sexual Activity   Alcohol use: No    Comment: hx heavy etoh x 3 yrs, quit 30+ yrs ago   Drug use: No   Sexual activity: Yes    Birth control/protection: None  Other Topics Concern   Not on file  Social History Narrative   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 Married for 12 years. Has 2 sons (21 and 30).  Lives with wife.   Social Determinants of Health   Financial Resource Strain: Not on file  Food Insecurity: Not on file  Transportation Needs: Not on file  Physical Activity: Not on file  Stress: Not on file  Social Connections: Not on file  Intimate Partner Violence: Not on file    Outpatient Medications Prior to Visit  Medication Sig Dispense Refill   amLODipine (NORVASC) 10 MG tablet Take 1 tablet (10 mg total) by mouth daily. 90 tablet 3   aspirin EC 81 MG tablet Take 1 tablet (81 mg total) by mouth daily with breakfast. 30 tablet 11   atorvastatin (LIPITOR) 10 MG tablet Take 1 tablet (10 mg total) by mouth daily. 90 tablet 3   Cholecalciferol (VITAMIN D) 125 MCG (5000 UT) CAPS Take 2,000 Units by mouth daily. 30 capsule 3   cloNIDine (CATAPRES) 0.1 MG tablet Take 1 tablet (0.1 mg total) by mouth 2 (two) times daily. 180 tablet 3   clopidogrel (PLAVIX) 75 MG tablet TAKE (1) TABLET BY MOUTH ONCE DAILY. 90 tablet 3   Ferrous Sulfate (IRON) 325 (65 Fe) MG TABS Take 325 mg by mouth daily.     glucose blood test strip 1 each by Other route 2 (two) times daily. Use as instructed bid E11.65 Relion Premier 100 each 5   Lancets MISC 1 each by Does not apply route 2 (two) times daily. E11.65 Relion Premier 100 each 5   LANTUS SOLOSTAR 100 UNIT/ML Solostar Pen INJECT 14 UNITS UNDER THE SKIN AT BEDTIME (Patient taking differently: 14 Units daily.) 15 mL 2   nicotine (NICODERM CQ - DOSED IN MG/24 HOURS) 14 mg/24hr patch Place 1 patch (14 mg total) onto the skin daily for 28 days. To help you quit smoking 28 patch 0   RESTASIS 0.05 % ophthalmic emulsion Place 1 drop into both eyes 2 (two) times daily as needed.     SURE COMFORT PEN NEEDLES 32G X 4 MM MISC USE ONCE DAILY 100 each 3   traZODone (DESYREL) 50 MG tablet Take 1 tablet (50 mg total) by mouth daily with breakfast. 90 tablet 3   No facility-administered medications prior to visit.    No Known Allergies  ROS Review of Systems   Constitutional:  Negative for chills and fever.  HENT:  Negative for congestion, postnasal drip and sore throat.   Respiratory:  Negative for cough and shortness of breath.   Cardiovascular:  Negative for chest pain and palpitations.  Gastrointestinal:  Positive for constipation. Negative for diarrhea and vomiting.  Genitourinary:  Negative for dysuria and hematuria.  Musculoskeletal:  Negative for neck pain and neck stiffness.  Skin:  Negative for rash.  Neurological:  Negative for dizziness and weakness.  Psychiatric/Behavioral:  Negative for agitation and behavioral problems.  Objective:    Physical Exam Vitals reviewed.  Constitutional:      General: He is not in acute distress.    Appearance: He is not diaphoretic.  HENT:     Head: Normocephalic and atraumatic.     Nose: No congestion.     Mouth/Throat:     Mouth: Mucous membranes are moist.  Eyes:     General: No scleral icterus.    Extraocular Movements: Extraocular movements intact.  Cardiovascular:     Rate and Rhythm: Normal rate and regular rhythm.     Pulses: Normal pulses.     Heart sounds: Normal heart sounds. No murmur heard. Pulmonary:     Breath sounds: Normal breath sounds. No wheezing or rales.  Abdominal:     Palpations: Abdomen is soft.     Tenderness: There is no abdominal tenderness.  Musculoskeletal:     Cervical back: Neck supple. No tenderness.     Right lower leg: No edema.     Left lower leg: No edema.  Skin:    General: Skin is warm.     Findings: No rash.  Neurological:     General: No focal deficit present.     Mental Status: He is alert and oriented to person, place, and time.  Psychiatric:        Mood and Affect: Mood normal.        Behavior: Behavior normal.     BP 138/60 (BP Location: Right Arm, Patient Position: Sitting, Cuff Size: Normal)   Pulse 69   Resp 16   Ht 5' 7.5" (1.715 m)   Wt 130 lb 9.6 oz (59.2 kg)   SpO2 98%   BMI 20.15 kg/m  Wt Readings from Last 3  Encounters:  04/30/22 130 lb 9.6 oz (59.2 kg)  04/23/22 125 lb 14.1 oz (57.1 kg)  03/06/22 129 lb (58.5 kg)    No results found for: "TSH" Lab Results  Component Value Date   WBC 6.6 04/22/2022   HGB 15.0 04/22/2022   HCT 43.6 04/22/2022   MCV 95.2 04/22/2022   PLT 178 04/22/2022   Lab Results  Component Value Date   NA 134 (L) 04/22/2022   K 3.9 04/22/2022   CO2 26 04/22/2022   GLUCOSE 101 (H) 04/22/2022   BUN 14 04/22/2022   CREATININE 1.54 (H) 04/22/2022   BILITOT 0.7 10/25/2021   ALKPHOS 103 10/25/2021   AST 21 10/25/2021   ALT 13 10/25/2021   PROT 7.8 10/25/2021   ALBUMIN 4.1 10/25/2021   CALCIUM 9.9 04/22/2022   ANIONGAP 12 04/22/2022   EGFR 58 (L) 08/18/2021   Lab Results  Component Value Date   CHOL 73 04/23/2022   Lab Results  Component Value Date   HDL 35 (L) 04/23/2022   Lab Results  Component Value Date   LDLCALC 34 04/23/2022   Lab Results  Component Value Date   TRIG 21 04/23/2022   Lab Results  Component Value Date   CHOLHDL 2.1 04/23/2022   Lab Results  Component Value Date   HGBA1C 6.1 02/24/2022      Assessment & Plan:   Problem List Items Addressed This Visit       Cardiovascular and Mediastinum   Essential hypertension    BP Readings from Last 1 Encounters:  04/30/22 138/60  Well-controlled with amlodipine, lisinopril-HCTZ and clonidine Followed by cardiology Counseled for compliance with the medications Advised DASH diet and moderate exercise/walking, at least 150 mins/week      Relevant  Orders   Basic Metabolic Panel (BMET)     Other   Atypical chest pain - Primary    Trops negative Nuclear stress test negative for acute ischemia On aspirin, Plavix and statin for PAD Episode of chest pain/palpitations likely due to dehydration      Hospital discharge follow-up    Hospital chart reviewed, including discharge summary Medications reconciled and reviewed with the patient in detail      Relevant Orders   Basic  Metabolic Panel (BMET)    No orders of the defined types were placed in this encounter.   Follow-up: Return if symptoms worsen or fail to improve.    Lindell Spar, MD

## 2022-04-30 NOTE — Assessment & Plan Note (Signed)
Hospital chart reviewed, including discharge summary Medications reconciled and reviewed with the patient in detail 

## 2022-04-30 NOTE — Assessment & Plan Note (Signed)
BP Readings from Last 1 Encounters:  04/30/22 138/60   Well-controlled with amlodipine, lisinopril-HCTZ and clonidine Followed by cardiology Counseled for compliance with the medications Advised DASH diet and moderate exercise/walking, at least 150 mins/week

## 2022-04-30 NOTE — Patient Instructions (Signed)
Please continue taking medications as prescribed.  Please maintain at least 64 ounces of fluid intake and avoid excessive heat exposure.

## 2022-04-30 NOTE — Assessment & Plan Note (Signed)
Trops negative Nuclear stress test negative for acute ischemia On aspirin, Plavix and statin for PAD Episode of chest pain/palpitations likely due to dehydration

## 2022-05-01 LAB — BASIC METABOLIC PANEL
BUN/Creatinine Ratio: 19 (ref 10–24)
BUN: 22 mg/dL (ref 8–27)
CO2: 22 mmol/L (ref 20–29)
Calcium: 9.6 mg/dL (ref 8.6–10.2)
Chloride: 100 mmol/L (ref 96–106)
Creatinine, Ser: 1.17 mg/dL (ref 0.76–1.27)
Glucose: 84 mg/dL (ref 70–99)
Potassium: 4.3 mmol/L (ref 3.5–5.2)
Sodium: 135 mmol/L (ref 134–144)
eGFR: 67 mL/min/{1.73_m2} (ref >59)

## 2022-05-06 ENCOUNTER — Telehealth: Payer: Self-pay

## 2022-05-06 NOTE — Telephone Encounter (Signed)
-----   Message from Antoine Poche, MD sent at 05/06/2022 10:34 AM EDT ----- Stress test overall looks good, no significant evidence of blockages in the heart  Dominga Ferry MD

## 2022-05-06 NOTE — Telephone Encounter (Signed)
Patient's spouse notified and verbalized understanding (ok per DPR). Pt's spouse had no questions or concerns at this time.

## 2022-06-17 IMAGING — CT CT RENAL STONE PROTOCOL
2 of 4 series · 16 of 46 positions shown, 18 images · non-contrast
Comparison: 10/30/2017

CLINICAL DATA: Left-sided flank pain and dysuria

EXAM:
CT ABDOMEN AND PELVIS WITHOUT CONTRAST
TECHNIQUE: Multidetector CT imaging of the abdomen and pelvis was performed
following the standard protocol without IV contrast.

[Series 2: axial st · axial · 0.76mm/px · z∈[+721,+1101]mm · 13 of 86 slices shown, 15 images]
[im 5/86  soft-tissue]
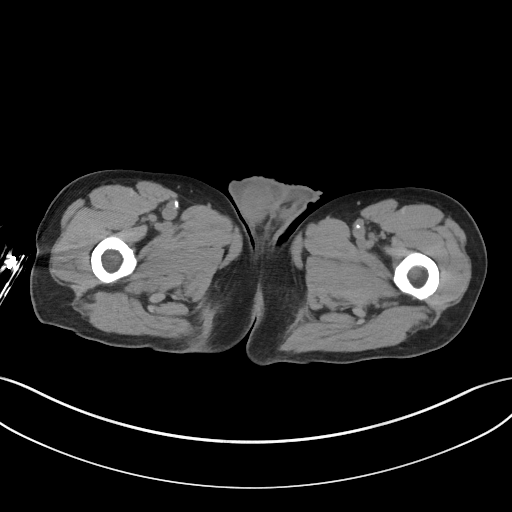
[im 5/86  bone]
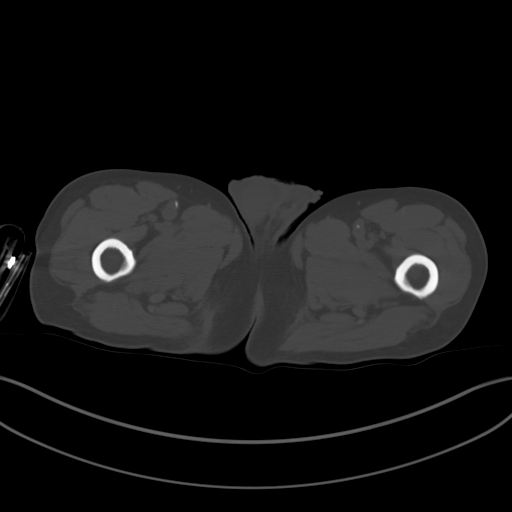
[im 13/86  soft-tissue]
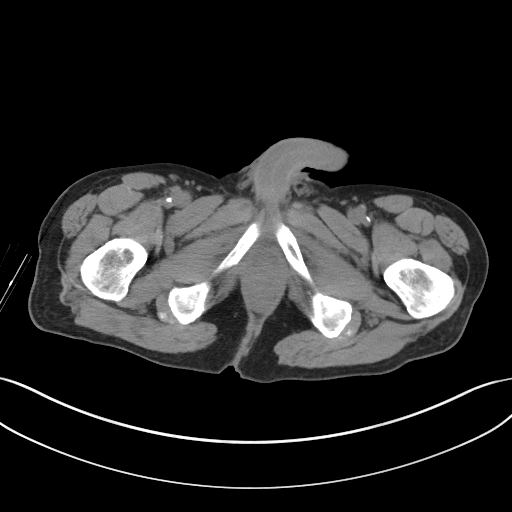
[im 18/86  soft-tissue]
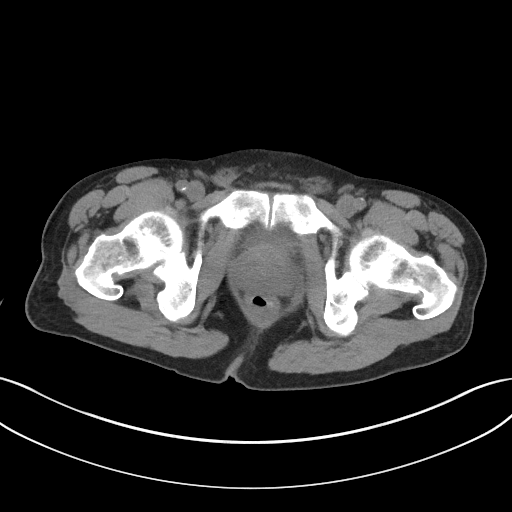
[im 26/86  soft-tissue]
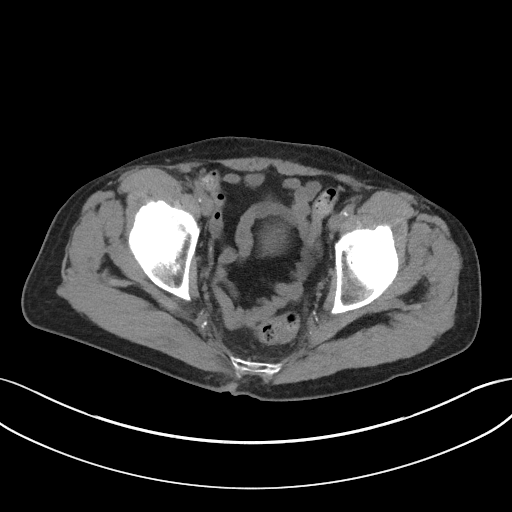
[im 30/86  soft-tissue]
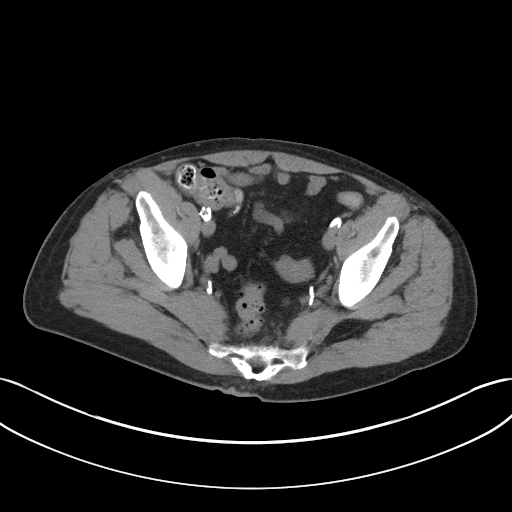
[im 39/86  soft-tissue]
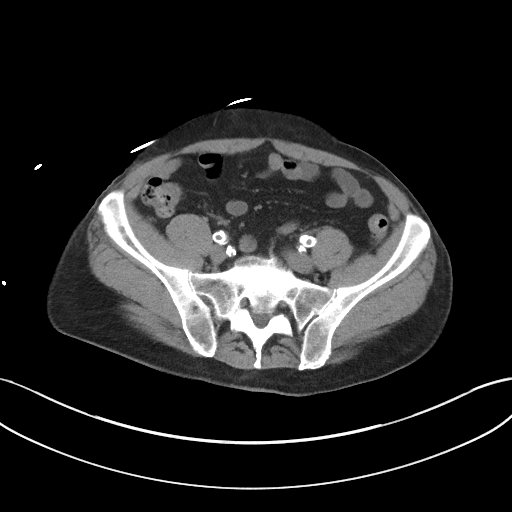
[im 43/86  soft-tissue]
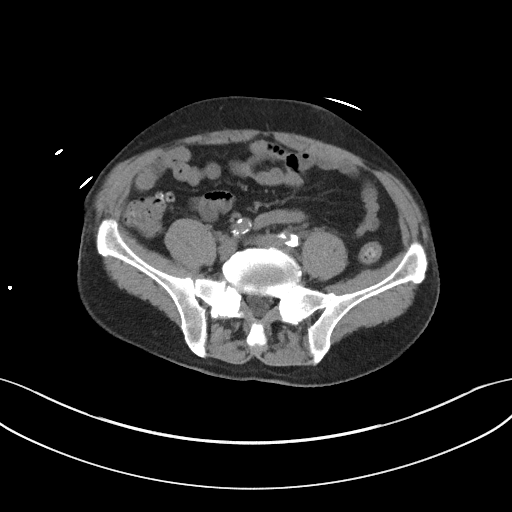
[im 47/86  soft-tissue]
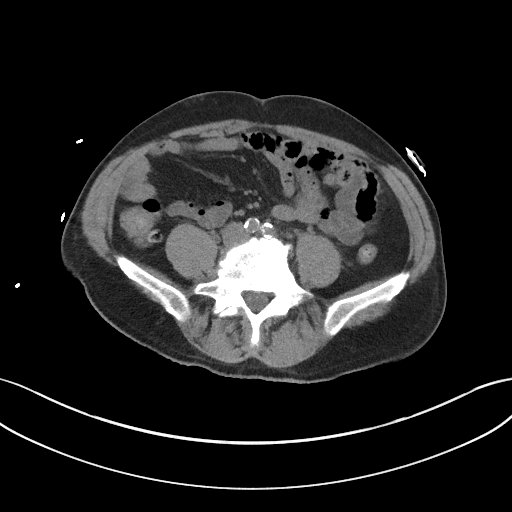
[im 56/86  soft-tissue]
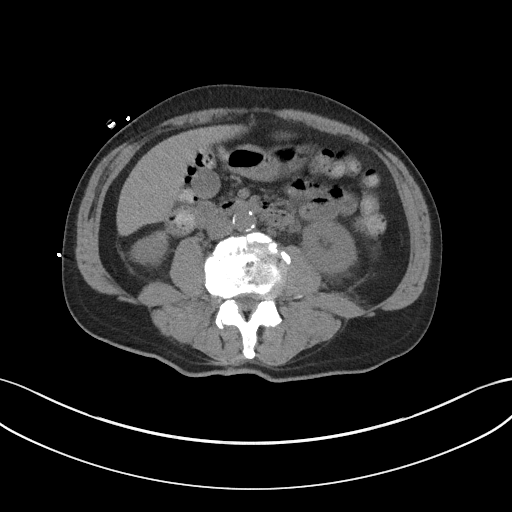
[im 56/86  bone]
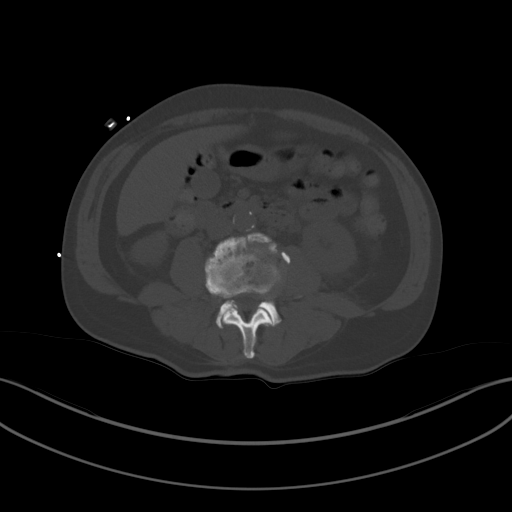
[im 60/86  soft-tissue]
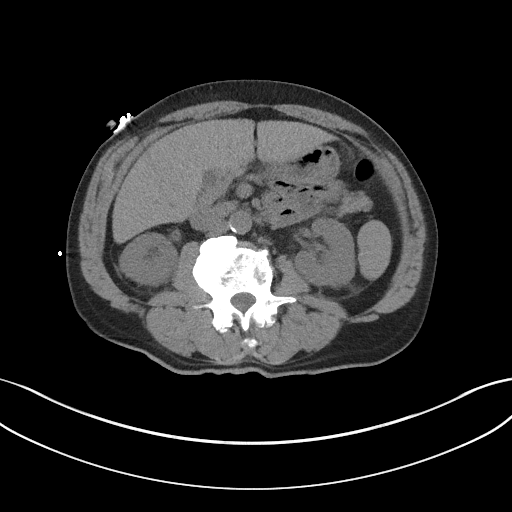
[im 69/86  soft-tissue]
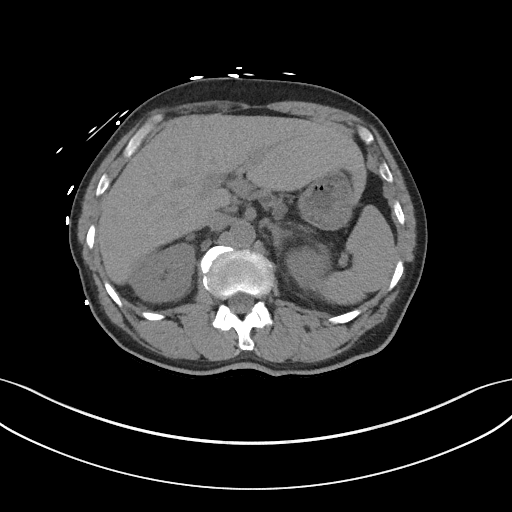
[im 73/86  soft-tissue]
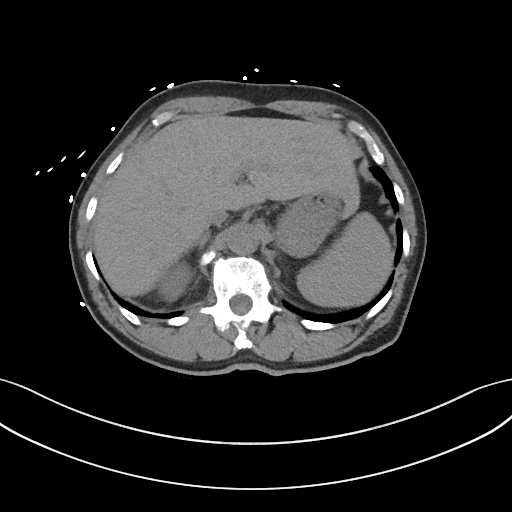
[im 81/86  soft-tissue]
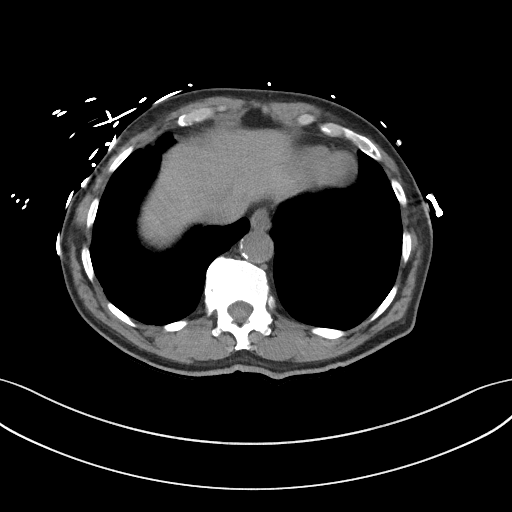

[Series 5: coronal st · coronal · 0.71mm/px · 3 of 89 slices shown]
[im 30/89  soft-tissue]
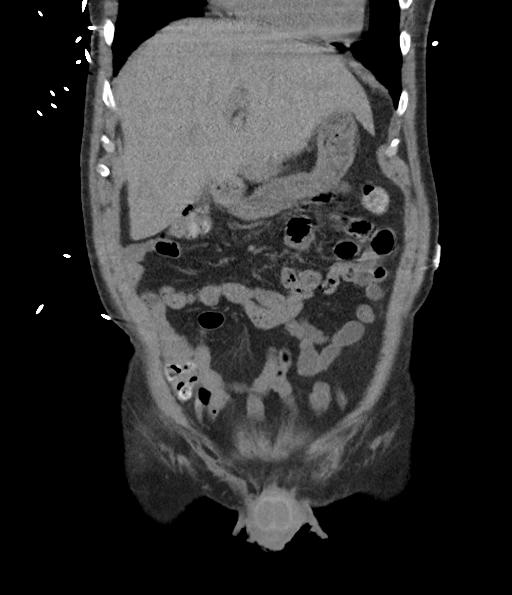
[im 40/89  soft-tissue]
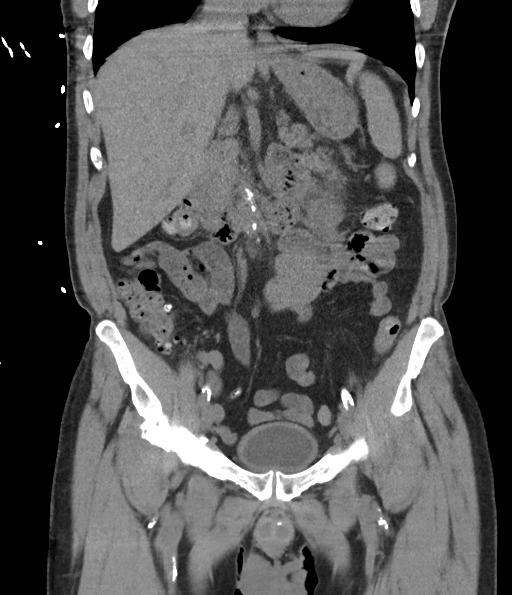
[im 49/89  soft-tissue]
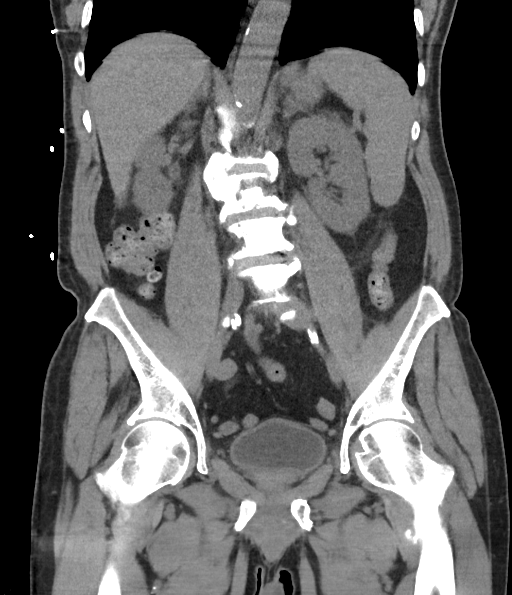

[16 of 46 positions shown; findings below may reference images not displayed]

FINDINGS: Lower chest: Emphysematous changes are noted. No focal infiltrate or
effusion is seen.

Hepatobiliary: Gallbladder is within normal limits. Area of focal
fatty infiltration is noted along the falciform ligament. No other
focal abnormality is noted.

Pancreas: Unremarkable. No pancreatic ductal dilatation or
surrounding inflammatory changes.

Spleen: Normal in size without focal abnormality.

Adrenals/Urinary Tract: Adrenal glands are within normal limits.
Right kidney is well visualized without renal calculi or obstructive
changes. The left kidney demonstrates some increased perinephric
stranding. A few tiny left renal stones are noted. No stones are
identified. The bladder is incompletely distended.

Stomach/Bowel: Scattered diverticular change of the colon is noted.
The appendix is well visualized and within normal limits. Small
bowel and stomach are unremarkable.

Vascular/Lymphatic: Aortic atherosclerosis. No enlarged abdominal or
pelvic lymph nodes.

Reproductive: Prostate is unremarkable.

Other: No abdominal wall hernia or abnormality. No abdominopelvic
ascites.

Musculoskeletal: Degenerative changes of lumbar spine are noted.
IMPRESSION: A few tiny nonobstructing left renal calculi.

Mild diverticular change without diverticulitis.

Mild fatty infiltration of the liver along the falciform ligament.

Aortic Atherosclerosis (N0P8E-HP7.7) and Emphysema (N0P8E-4HR.2).

## 2022-06-29 DIAGNOSIS — E1151 Type 2 diabetes mellitus with diabetic peripheral angiopathy without gangrene: Secondary | ICD-10-CM | POA: Diagnosis not present

## 2022-06-29 DIAGNOSIS — L851 Acquired keratosis [keratoderma] palmaris et plantaris: Secondary | ICD-10-CM | POA: Diagnosis not present

## 2022-06-29 DIAGNOSIS — L603 Nail dystrophy: Secondary | ICD-10-CM | POA: Diagnosis not present

## 2022-07-03 ENCOUNTER — Ambulatory Visit (INDEPENDENT_AMBULATORY_CARE_PROVIDER_SITE_OTHER): Payer: Medicare Other | Admitting: Urology

## 2022-07-03 VITALS — BP 145/70 | HR 84

## 2022-07-03 DIAGNOSIS — N489 Disorder of penis, unspecified: Secondary | ICD-10-CM

## 2022-07-03 LAB — MICROSCOPIC EXAMINATION

## 2022-07-03 LAB — URINALYSIS, ROUTINE W REFLEX MICROSCOPIC
Bilirubin, UA: NEGATIVE
Glucose, UA: NEGATIVE
Ketones, UA: NEGATIVE
Nitrite, UA: NEGATIVE
Specific Gravity, UA: 1.01 (ref 1.005–1.030)
Urobilinogen, Ur: 0.2 mg/dL (ref 0.2–1.0)
pH, UA: 6 (ref 5.0–7.5)

## 2022-07-03 MED ORDER — NYSTATIN-TRIAMCINOLONE 100000-0.1 UNIT/GM-% EX OINT: 1.0000 | TOPICAL_OINTMENT | Freq: Two times a day (BID) | CUTANEOUS | 5 refills | Status: DC

## 2022-07-03 NOTE — Progress Notes (Unsigned)
07/03/2022 9:54 AM   Chase Briggs 1951/05/29 [id number]  Referring provider: Lindell Spar, MD [address] [place],  [place] [address]  Followup balanitis   HPI: Chase Briggs is a 71yo here for followup for balanitis and a penile rash. No penile irritation or rash noted since last visit. He has not had to use nystatin in several months. No significant LUTS. No other complaints today   PMH: Past Medical History:  Diagnosis Date   Diabetes mellitus    Fatty liver    GERD (gastroesophageal reflux disease)    HTN (hypertension)    Hyperlipidemia    Insomnia    Renal insufficiency     Surgical History: Past Surgical History:  Procedure Laterality Date   ABDOMINAL AORTOGRAM W/LOWER EXTREMITY N/A 08/22/2018   Procedure: ABDOMINAL AORTOGRAM W/LOWER EXTREMITY;  Surgeon: Waynetta Sandy, MD;  Location: [facility];  Service: Cardiovascular;  Laterality: N/A;   BACK SURGERY     CARPAL TUNNEL WITH CUBITAL TUNNEL Right 03/21/2019   Procedure: RIGHT CARPAL TUNNEL AND CUBITAL  TUNNEL RELEASES;  Surgeon: Daryll Brod, MD;  Location: [facility];  Service: Orthopedics;  Laterality: Right;  AXILLARY BLOCK   COLONOSCOPY  01/2007   Dr. Arnoldo Morale, reviewed report, mild diverticulosis. Prep adequate. Next TCS due 01/2017   ESOPHAGOGASTRODUODENOSCOPY N/A 10/21/2012   [id number] GASTRITIS   MASS EXCISION Right 10/26/2012   Procedure: EXCISION MASS;  Surgeon: Jamesetta So, MD;  Location: [facility];  Service: General;  Laterality: Right;   PERIPHERAL VASCULAR INTERVENTION  08/22/2018   Procedure: PERIPHERAL VASCULAR INTERVENTION;  Surgeon: Waynetta Sandy, MD;  Location: [facility];  Service: Cardiovascular;;  bilateral external iliac stents   scalp mass  4-5 yrs ago   Dr Caralyn Guile NERVE TRANSPOSITION Right 03/21/2019   Procedure: DECOMPRESSION RIGHT ULNAR NERVE;  Surgeon: Daryll Brod, MD;  Location: [place];  Service:  Orthopedics;  Laterality: Right;    Home Medications:  Allergies as of 07/03/2022   No Known Allergies      Medication List        Accurate as of July 03, 2022  9:54 AM. If you have any questions, ask your nurse or doctor.          amLODipine 10 MG tablet Commonly known as: NORVASC Take 1 tablet (10 mg total) by mouth daily.   aspirin EC 81 MG tablet Take 1 tablet (81 mg total) by mouth daily with breakfast.   atorvastatin 10 MG tablet Commonly known as: LIPITOR Take 1 tablet (10 mg total) by mouth daily.   cloNIDine 0.1 MG tablet Commonly known as: CATAPRES Take 1 tablet (0.1 mg total) by mouth 2 (two) times daily.   clopidogrel 75 MG tablet Commonly known as: PLAVIX TAKE (1) TABLET BY MOUTH ONCE DAILY.   glucose blood test strip 1 each by Other route 2 (two) times daily. Use as instructed bid E11.65 Relion Premier   Iron 325 (65 Fe) MG Tabs Take 325 mg by mouth daily.   Lancets Misc 1 each by Does not apply route 2 (two) times daily. E11.65 Relion Premier   Lantus SoloStar 100 UNIT/ML Solostar Pen Generic drug: insulin glargine INJECT 14 UNITS UNDER THE SKIN AT BEDTIME What changed: See the new instructions.   lisinopril-hydrochlorothiazide 20-25 MG tablet Commonly known as: ZESTORETIC Take 1 tablet by mouth daily.   Restasis 0.05 % ophthalmic emulsion Generic drug: cycloSPORINE Place 1 drop into both  eyes 2 (two) times daily as needed.   Sure Comfort Pen Needles 32G X 4 MM Misc Generic drug: Insulin Pen Needle USE ONCE DAILY   traZODone 50 MG tablet Commonly known as: DESYREL Take 1 tablet (50 mg total) by mouth daily with breakfast.   Vitamin D 125 MCG (5000 UT) Caps Take 2,000 Units by mouth daily.        Allergies: No Known Allergies  Family History: Family History  Problem Relation Age of Onset   Diabetes Mother    Diabetes Father    Diabetes Brother    Colon cancer Neg Hx    Liver disease Neg Hx     Social History:   reports that he has been smoking cigarettes. He has a 35.00 pack-year smoking history. He has never used smokeless tobacco. He reports that he does not drink alcohol and does not use drugs.  ROS: All other review of systems were reviewed and are negative except what is noted above in HPI  Physical Exam: BP (!) 145/70   Pulse 84   Constitutional:  Alert and oriented, No acute distress. HEENT: Chase Briggs AT, moist mucus membranes.  Trachea midline, no masses. Cardiovascular: No clubbing, cyanosis, or edema. Respiratory: Normal respiratory effort, no increased work of breathing. GI: Abdomen is soft, nontender, nondistended, no abdominal masses GU: No CVA tenderness.  Lymph: No cervical or inguinal lymphadenopathy. Skin: No rashes, bruises or suspicious lesions. Neurologic: Grossly intact, no focal deficits, moving all 4 extremities. Psychiatric: Normal mood and affect.  Laboratory Data: Lab Results  Component Value Date   WBC 6.6 04/22/2022   HGB 15.0 04/22/2022   HCT 43.6 04/22/2022   MCV 95.2 04/22/2022   PLT 178 04/22/2022    Lab Results  Component Value Date   CREATININE 1.17 04/30/2022    No results found for: "PSA"  No results found for: "TESTOSTERONE"  Lab Results  Component Value Date   HGBA1C 6.1 02/24/2022    Urinalysis    Component Value Date/Time   COLORURINE YELLOW 01/28/2021 1223   APPEARANCEUR HAZY (A) 01/28/2021 1223   APPEARANCEUR Clear 01/01/2021 0857   LABSPEC 1.005 01/28/2021 1223   PHURINE 6.0 01/28/2021 1223   GLUCOSEU 50 (A) 01/28/2021 1223   HGBUR SMALL (A) 01/28/2021 1223   BILIRUBINUR negative 03/06/2022 0829   BILIRUBINUR Negative 01/01/2021 0857   KETONESUR negative 03/06/2022 0829   KETONESUR NEGATIVE 01/28/2021 1223   PROTEINUR NEGATIVE 01/28/2021 1223   UROBILINOGEN 1.0 03/06/2022 0829   UROBILINOGEN 0.2 02/19/2014 1340   NITRITE Positive (A) 03/06/2022 0829   NITRITE NEGATIVE 01/28/2021 1223   LEUKOCYTESUR Trace (A) 03/06/2022 0829    LEUKOCYTESUR LARGE (A) 01/28/2021 1223    Lab Results  Component Value Date   LABMICR 32.1 01/19/2022   WBCUA 0-5 01/01/2021   LABEPIT 0-10 01/01/2021   MUCUS Present 09/23/2020   BACTERIA RARE (A) 01/28/2021    Pertinent Imaging:  No results found for this or any previous visit.  No results found for this or any previous visit.  No results found for this or any previous visit.  No results found for this or any previous visit.  No results found for this or any previous visit.  No valid procedures specified. No results found for this or any previous visit.  Results for orders placed during the hospital encounter of 01/28/21  CT Renal Stone Study  Narrative CLINICAL DATA:  Left-sided flank pain and dysuria  EXAM: CT ABDOMEN AND PELVIS WITHOUT CONTRAST  TECHNIQUE: Multidetector  CT imaging of the abdomen and pelvis was performed following the standard protocol without IV contrast.  COMPARISON:  10/30/2017  FINDINGS: Lower chest: Emphysematous changes are noted. No focal infiltrate or effusion is seen.  Hepatobiliary: Gallbladder is within normal limits. Area of focal fatty infiltration is noted along the falciform ligament. No other focal abnormality is noted.  Pancreas: Unremarkable. No pancreatic ductal dilatation or surrounding inflammatory changes.  Spleen: Normal in size without focal abnormality.  Adrenals/Urinary Tract: Adrenal glands are within normal limits. Right kidney is well visualized without renal calculi or obstructive changes. The left kidney demonstrates some increased perinephric stranding. A few tiny left renal stones are noted. No stones are identified. The bladder is incompletely distended.  Stomach/Bowel: Scattered diverticular change of the colon is noted. The appendix is well visualized and within normal limits. Small bowel and stomach are unremarkable.  Vascular/Lymphatic: Aortic atherosclerosis. No enlarged abdominal or pelvic  lymph nodes.  Reproductive: Prostate is unremarkable.  Other: No abdominal wall hernia or abnormality. No abdominopelvic ascites.  Musculoskeletal: Degenerative changes of lumbar spine are noted.  IMPRESSION: A few tiny nonobstructing left renal calculi.  Mild diverticular change without diverticulitis.  Mild fatty infiltration of the liver along the falciform ligament.  Aortic Atherosclerosis (ICD10-I70.0) and Emphysema (ICD10-J43.9).   Electronically Signed By: Inez Catalina M.D. On: 01/28/2021 15:18   Assessment & Plan:    1. Penile lesion -continue nystatin cream prn. RTC prn - Urinalysis, Routine w reflex microscopic   No follow-ups on file.  Nicolette Bang, MD  [facility]

## 2022-07-09 ENCOUNTER — Encounter: Payer: Self-pay | Admitting: Urology

## 2022-07-09 NOTE — Patient Instructions (Signed)
Balanitis ? ?Balanitis is swelling and irritation of the head of the penis (glans penis). Balanitis occurs most often among males who have not had their foreskin removed (uncircumcised). In uncircumcised males, the condition may also cause inflammation of the skin around the foreskin. ?Balanitis sometimes causes scarring of the penis or foreskin, which can require surgery. This condition may develop because of an infection or another medical condition. Untreated balanitis can increase the risk of penile cancer. ?What are the causes? ?Common causes of this condition include: ?Irritation and lack of airflow due to fluid (smegma) that can build up on the glans penis. ?Poor personal hygiene, especially in uncircumcised males. Not cleaning the glans penis and foreskin well can result in a buildup of bacteria, viruses, and yeast, which can lead to infection and inflammation. ?Other causes include: ?Chemical irritation from products such as soaps or shower gels, especially those that have fragrance. Chemical irritation can also be caused by condoms, personal lubricants, petroleum jelly, spermicides, fabric softeners, or laundry detergents. ?Skin conditions, such as eczema, dermatitis, and psoriasis. ?Allergies to medicines, such as tetracycline and sulfa drugs. ?What increases the risk? ?The following factors may make you more likely to develop this condition: ?Being an uncircumcised male. ?Having diabetes. ?Having other medical conditions, including liver cirrhosis, congestive heart failure, or kidney disease. ?Having infections, such as candidiasis, HPV (human papillomavirus), herpes simplex, gonorrhea, or syphilis. ?Having a tight foreskin that is difficult to pull back (retract) past the glans penis. ?Being severely obese. ?History of reactive arthritis. ?What are the signs or symptoms? ?Symptoms of this condition include: ?Discharge from under the foreskin, and pain or difficulty retracting the foreskin. ?A bad smell  or itchiness on the penis. ?Tenderness, redness, and swelling of the glans penis. ?A rash or sores on the glans penis or foreskin. ?Inability to get an erection due to pain. ?Trouble urinating. ?Scarring of the penis or foreskin, in some cases. ?How is this diagnosed? ?This condition may be diagnosed based on a physical exam and tests of a swab of discharge to check for bacterial or fungal infection. ?You may also have blood tests to check for: ?Viruses that can cause balanitis. ?A high blood sugar (glucose) level. This could be a sign of diabetes, which can increase the risk of balanitis. ?How is this treated? ?Treatment for this condition depends on the cause. Treatment may include: ?Improving personal hygiene. Your health care provider may recommend sitting in a bath of warm water that is deep enough to cover your hips and buttocks (sitz bath). ?Medicines such as: ?Creams or ointments to reduce swelling (steroids) or to treat an infection. ?Antibiotic medicine. ?Antifungal medicine. ?Having surgery to remove or cut the foreskin (circumcision). This may be done if you have scarring on the foreskin that makes it difficult to retract. ?Controlling other medical problems that may be causing your condition or making it worse. ?Follow these instructions at home: ?Medicines ?Take over-the-counter and prescription medicines only as told by your health care provider. ?If you were prescribed an antibiotic medicine, use it as told by your health care provider. Do not stop using the antibiotic even if you start to feel better. ?General instructions ?Do not have sex until the condition clears up, or until your health care provider approves. ?Keep your penis clean and dry. Take sitz baths as recommended by your health care provider. ?Avoid products that irritate your skin or make symptoms worse, such as soaps and shower gels that have fragrance. ?Keep all follow-up visits. This is   important. ?Contact a health care provider  if: ?Your symptoms get worse or do not improve with home care. ?You develop chills or a fever. ?You have trouble urinating. ?You cannot retract your foreskin. ?Get help right away if: ?You develop severe pain. ?You are unable to urinate. ?Summary ?Balanitis is swelling and irritation of the head of the penis (glans penis). This condition is most common among uncircumcised males. ?Balanitis causes pain, redness, and swelling of the glans penis. ?Good personal hygiene is important. ?Treatment may include improving personal hygiene and applying creams or ointments. ?Contact a health care provider if your symptoms get worse or do not improve with home care. ?This information is not intended to replace advice given to you by your health care provider. Make sure you discuss any questions you have with your health care provider. ?Document Revised: 02/12/2021 Document Reviewed: 02/12/2021 ?Elsevier Patient Education ? 2023 [employer] ? ?

## 2022-07-24 ENCOUNTER — Ambulatory Visit (INDEPENDENT_AMBULATORY_CARE_PROVIDER_SITE_OTHER): Payer: Medicare Other | Admitting: Internal Medicine

## 2022-07-24 ENCOUNTER — Encounter: Payer: Self-pay | Admitting: Internal Medicine

## 2022-07-24 VITALS — BP 132/58 | HR 76 | Ht 67.0 in | Wt 131.8 lb

## 2022-07-24 DIAGNOSIS — Z23 Encounter for immunization: Secondary | ICD-10-CM

## 2022-07-24 DIAGNOSIS — G4700 Insomnia, unspecified: Secondary | ICD-10-CM | POA: Diagnosis not present

## 2022-07-24 DIAGNOSIS — I10 Essential (primary) hypertension: Secondary | ICD-10-CM | POA: Diagnosis not present

## 2022-07-24 DIAGNOSIS — E1169 Type 2 diabetes mellitus with other specified complication: Secondary | ICD-10-CM | POA: Diagnosis not present

## 2022-07-24 DIAGNOSIS — Z122 Encounter for screening for malignant neoplasm of respiratory organs: Secondary | ICD-10-CM

## 2022-07-24 DIAGNOSIS — F17218 Nicotine dependence, cigarettes, with other nicotine-induced disorders: Secondary | ICD-10-CM

## 2022-07-24 DIAGNOSIS — E782 Mixed hyperlipidemia: Secondary | ICD-10-CM | POA: Diagnosis not present

## 2022-07-24 DIAGNOSIS — Z720 Tobacco use: Secondary | ICD-10-CM

## 2022-07-24 MED ORDER — TRAZODONE HCL 50 MG PO TABS: 75.0000 mg | ORAL_TABLET | Freq: Every day | ORAL | 1 refills | Status: DC

## 2022-07-24 NOTE — Assessment & Plan Note (Addendum)
Smokes about 10 cigarettes per day  Asked about quitting: confirms that he currently smokes cigarettes Advise to quit smoking: Educated about QUITTING to reduce the risk of cancer, cardio and cerebrovascular disease. Assess willingness: Unwilling to quit at this time, but is working on cutting back. Assist with counseling and pharmacotherapy: Counseled for 5 minutes and literature provided. Arrange for follow up:  Follow up in 3 months and continue to offer help.

## 2022-07-24 NOTE — Assessment & Plan Note (Signed)
Lab Results  Component Value Date   HGBA1C 6.1 02/24/2022   Associated with HTN Well-controlled with Lantus 14 units every morning, followed by Dr. Emeline Darling to follow diabetic diet On statin and ACEi F/u CMP and lipid panel Diabetic eye exam: Advised to follow up with Ophthalmology for diabetic eye exam

## 2022-07-24 NOTE — Progress Notes (Signed)
Established Patient Office Visit  Subjective:  Patient ID: Chase Briggs, male    DOB: 1950-11-25  Age: 71 y.o. MRN: [id number]  CC:  Chief Complaint  Patient presents with   Follow-up    Follow up, patient reports falling asleep during the day more, not resting at night    HPI Chase Briggs is a 71 y.o. male with past medical history of HTN, PAD, allergic rhinitis, type II DM, HLD, insomnia, anemia and tobacco abuse who presents for f/u of his chronic medical conditions.  HTN: BP is well-controlled. Takes medications regularly. Patient denies headache, dizziness, chest pain, dyspnea or palpitations.  He follows up with cardiology as well.   PAD: Has had right and left iliac stent placement, followed by vascular surgery.  He takes aspirin, Plavix and statin.  Denies any recent worsening of claudication symptoms.   Type II DM with HLD: He takes Lantus 14 units in the morning, followed by Dr. Dorris Fetch.  Denies any fatigue, polyuria or polydipsia.  He takes Lipitor for HLD. Denies any fever, chills, chronic cough, hemoptysis, or night sweats.  He takes trazodone for insomnia.  He reports difficulty maintaining sleep, and wakes up after 3 hours of sleep.  He also complains of daytime sleepiness.  Does not report snoring. Denies any anhedonia, SI or HI currently.  He smokes about 10 cigarettes/day, but used to smoke 1.5 pack/day x15 years in the past.     Past Medical History:  Diagnosis Date   Diabetes mellitus    Fatty liver    GERD (gastroesophageal reflux disease)    HTN (hypertension)    Hyperlipidemia    Insomnia    Renal insufficiency     Past Surgical History:  Procedure Laterality Date   ABDOMINAL AORTOGRAM W/LOWER EXTREMITY N/A 08/22/2018   Procedure: ABDOMINAL AORTOGRAM W/LOWER EXTREMITY;  Surgeon: Waynetta Sandy, MD;  Location: [facility];  Service: Cardiovascular;  Laterality: N/A;   BACK SURGERY     CARPAL TUNNEL WITH CUBITAL TUNNEL  Right 03/21/2019   Procedure: RIGHT CARPAL TUNNEL AND CUBITAL  TUNNEL RELEASES;  Surgeon: Daryll Brod, MD;  Location: [place];  Service: Orthopedics;  Laterality: Right;  AXILLARY BLOCK   COLONOSCOPY  01/2007   Dr. Arnoldo Morale, reviewed report, mild diverticulosis. Prep adequate. Next TCS due 01/2017   ESOPHAGOGASTRODUODENOSCOPY N/A 10/21/2012   RFX:[id number] GASTRITIS   MASS EXCISION Right 10/26/2012   Procedure: EXCISION MASS;  Surgeon: Jamesetta So, MD;  Location: [facility];  Service: General;  Laterality: Right;   PERIPHERAL VASCULAR INTERVENTION  08/22/2018   Procedure: PERIPHERAL VASCULAR INTERVENTION;  Surgeon: Waynetta Sandy, MD;  Location: [facility];  Service: Cardiovascular;;  bilateral external iliac stents   scalp mass  4-5 yrs ago   Dr Caralyn Guile NERVE TRANSPOSITION Right 03/21/2019   Procedure: DECOMPRESSION RIGHT ULNAR NERVE;  Surgeon: Daryll Brod, MD;  Location: [place];  Service: Orthopedics;  Laterality: Right;    Family History  Problem Relation Age of Onset   Diabetes Mother    Diabetes Father    Diabetes Brother    Colon cancer Neg Hx    Liver disease Neg Hx     Social History   Socioeconomic History   Marital status: Married    Spouse name: Not on file   Number of children: 2   Years of education: Not on file   Highest education level: Not on file  Occupational History  Occupation: reitred    Comment: [employer], heavy lifting  Tobacco Use   Smoking status: Every Day    Packs/day: 1.00    Years: 35.00    Total pack years: 35.00    Types: Cigarettes   Smokeless tobacco: Never  Vaping Use   Vaping Use: Never used  Substance and Sexual Activity   Alcohol use: No    Comment: hx heavy etoh x 3 yrs, quit 30+ yrs ago   Drug use: No   Sexual activity: Yes    Birth control/protection: None  Other Topics Concern   Not on file  Social History Narrative   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 Highest level of  education: 12th grade. Married for 12 years. Has 2 sons (78 and 59). Lives with wife.   Social Determinants of Health   Financial Resource Strain: Not on file  Food Insecurity: Not on file  Transportation Needs: Not on file  Physical Activity: Not on file  Stress: Not on file  Social Connections: Not on file  Intimate Partner Violence: Not on file    Outpatient Medications Prior to Visit  Medication Sig Dispense Refill   amLODipine (NORVASC) 10 MG tablet Take 1 tablet (10 mg total) by mouth daily. 90 tablet 3   aspirin EC 81 MG tablet Take 1 tablet (81 mg total) by mouth daily with breakfast. 30 tablet 11   atorvastatin (LIPITOR) 10 MG tablet Take 1 tablet (10 mg total) by mouth daily. 90 tablet 3   Cholecalciferol (VITAMIN D) 125 MCG (5000 UT) CAPS Take 2,000 Units by mouth daily. 30 capsule 3   cloNIDine (CATAPRES) 0.1 MG tablet Take 1 tablet (0.1 mg total) by mouth 2 (two) times daily. 180 tablet 3   clopidogrel (PLAVIX) 75 MG tablet TAKE (1) TABLET BY MOUTH ONCE DAILY. 90 tablet 3   Ferrous Sulfate (IRON) 325 (65 Fe) MG TABS Take 325 mg by mouth daily.     glucose blood test strip 1 each by Other route 2 (two) times daily. Use as instructed bid E11.65 Relion Premier 100 each 5   Lancets MISC 1 each by Does not apply route 2 (two) times daily. E11.65 Relion Premier 100 each 5   LANTUS SOLOSTAR 100 UNIT/ML Solostar Pen INJECT 14 UNITS UNDER THE SKIN AT BEDTIME (Patient taking differently: 14 Units daily.) 15 mL 2   lisinopril-hydrochlorothiazide (ZESTORETIC) 20-25 MG tablet Take 1 tablet by mouth daily.     nystatin-triamcinolone ointment (MYCOLOG) Apply 1 Application topically 2 (two) times daily. 30 g 5   RESTASIS 0.05 % ophthalmic emulsion Place 1 drop into both eyes 2 (two) times daily as needed.     SURE COMFORT PEN NEEDLES 32G X 4 MM MISC USE ONCE DAILY 100 each 3   traZODone (DESYREL) 50 MG tablet Take 1 tablet (50 mg total) by mouth daily with breakfast. 90 tablet 3   No  facility-administered medications prior to visit.    No Known Allergies  ROS Review of Systems  Constitutional:  Negative for chills and fever.  HENT:  Negative for congestion, postnasal drip and sore throat.   Respiratory:  Negative for cough and shortness of breath.   Cardiovascular:  Negative for chest pain and palpitations.  Gastrointestinal:  Positive for constipation. Negative for diarrhea and vomiting.  Genitourinary:  Negative for dysuria and hematuria.  Musculoskeletal:  Negative for neck pain and neck stiffness.  Skin:  Negative for rash.  Neurological:  Negative for dizziness and weakness.  Psychiatric/Behavioral:  Positive for sleep disturbance. Negative for agitation and behavioral problems.       Objective:    Physical Exam Vitals reviewed.  Constitutional:      General: He is not in acute distress.    Appearance: He is not diaphoretic.  HENT:     Head: Normocephalic and atraumatic.     Nose: No congestion.     Mouth/Throat:     Mouth: Mucous membranes are moist.  Eyes:     General: No scleral icterus.    Extraocular Movements: Extraocular movements intact.  Cardiovascular:     Rate and Rhythm: Normal rate and regular rhythm.     Pulses: Normal pulses.     Heart sounds: Normal heart sounds. No murmur heard. Pulmonary:     Breath sounds: Normal breath sounds. No wheezing or rales.  Musculoskeletal:     Cervical back: Neck supple. No tenderness.     Right lower leg: No edema.     Left lower leg: No edema.  Skin:    General: Skin is warm.     Findings: No rash.     Comments: Clubbing of fingernails  Neurological:     General: No focal deficit present.     Mental Status: He is alert and oriented to person, place, and time.  Psychiatric:        Mood and Affect: Mood normal.        Behavior: Behavior normal.     BP (!) 132/58 (BP Location: Left Arm, Patient Position: Sitting, Cuff Size: Normal)   Pulse 76   Ht _0  (1.702 m)   Wt 131 lb 12.8 oz  (59.8 kg)   SpO2 94%   BMI 20.64 kg/m  Wt Readings from Last 3 Encounters:  07/24/22 131 lb 12.8 oz (59.8 kg)  04/30/22 130 lb 9.6 oz (59.2 kg)  04/23/22 125 lb 14.1 oz (57.1 kg)    No results found for: "TSH" Lab Results  Component Value Date   WBC 6.6 04/22/2022   HGB 15.0 04/22/2022   HCT 43.6 04/22/2022   MCV 95.2 04/22/2022   PLT 178 04/22/2022   Lab Results  Component Value Date   NA 135 04/30/2022   K 4.3 04/30/2022   CO2 22 04/30/2022   GLUCOSE 84 04/30/2022   BUN 22 04/30/2022   CREATININE 1.17 04/30/2022   BILITOT 0.7 10/25/2021   ALKPHOS 103 10/25/2021   AST 21 10/25/2021   ALT 13 10/25/2021   PROT 7.8 10/25/2021   ALBUMIN 4.1 10/25/2021   CALCIUM 9.6 04/30/2022   ANIONGAP 12 04/22/2022   EGFR 67 04/30/2022   Lab Results  Component Value Date   CHOL 73 04/23/2022   Lab Results  Component Value Date   HDL 35 (L) 04/23/2022   Lab Results  Component Value Date   LDLCALC 34 04/23/2022   Lab Results  Component Value Date   TRIG 21 04/23/2022   Lab Results  Component Value Date   CHOLHDL 2.1 04/23/2022   Lab Results  Component Value Date   HGBA1C 6.1 02/24/2022      Assessment & Plan:   Problem List Items Addressed This Visit       Cardiovascular and Mediastinum   Essential hypertension - Primary    BP Readings from Last 1 Encounters:  07/24/22 (!) 132/58  Well-controlled with amlodipine, lisinopril-HCTZ and clonidine Followed by cardiology Counseled for compliance with the medications Advised DASH diet and moderate exercise/walking, at least 150 mins/week  Endocrine   Type 2 diabetes mellitus with other specified complication (HCC)    Lab Results  Component Value Date   HGBA1C 6.1 02/24/2022  Associated with HTN Well-controlled with Lantus 14 units every morning, followed by Dr. Sallyanne Havers to follow diabetic diet On statin and ACEi F/u CMP and lipid panel Diabetic eye exam: Advised to follow up with Ophthalmology  for diabetic eye exam        Other   Mixed hyperlipidemia    On Lipitor      Insomnia    Takes trazodone 50 mg nightly, but still complains of insomnia and daytime sleepiness Increased dose of trazodone to 75 mg nightly Sleep hygiene material provided      Relevant Medications   traZODone (DESYREL) 50 MG tablet   Tobacco abuse    Smokes about 10 cigarettes per day  Asked about quitting: confirms that he currently smokes cigarettes Advise to quit smoking: Educated about QUITTING to reduce the risk of cancer, cardio and cerebrovascular disease. Assess willingness: Unwilling to quit at this time, but is working on cutting back. Assist with counseling and pharmacotherapy: Counseled for 5 minutes and literature provided. Arrange for follow up:  Follow up in 3 months and continue to offer help.      Other Visit Diagnoses     Screening for lung cancer       Relevant Orders   CT CHEST LUNG CANCER SCREENING LOW DOSE WO CONTRAST   Cigarette nicotine dependence with other nicotine-induced disorder       Relevant Orders   CT CHEST LUNG CANCER SCREENING LOW DOSE WO CONTRAST   Immunization due       Relevant Orders   Pneumococcal conjugate vaccine 20-valent (Completed)   Need for immunization against influenza       Relevant Orders   Flu Vaccine QUAD High Dose(Fluad) (Completed)       Meds ordered this encounter  Medications   traZODone (DESYREL) 50 MG tablet    Sig: Take 1.5 tablets (75 mg total) by mouth daily with breakfast.    Dispense:  135 tablet    Refill:  1    Dose change    Follow-up: Return in about 4 months (around 11/22/2022) for Annual physical.    Lindell Spar, MD

## 2022-07-24 NOTE — Assessment & Plan Note (Signed)
BP Readings from Last 1 Encounters:  07/24/22 (!) 132/58   Well-controlled with amlodipine, lisinopril-HCTZ and clonidine Followed by cardiology Counseled for compliance with the medications Advised DASH diet and moderate exercise/walking, at least 150 mins/week

## 2022-07-24 NOTE — Assessment & Plan Note (Signed)
Takes trazodone 50 mg nightly, but still complains of insomnia and daytime sleepiness Increased dose of trazodone to 75 mg nightly Sleep hygiene material provided

## 2022-07-24 NOTE — Patient Instructions (Signed)
Please start taking Trazodone 1.5 tablets instead of 1 tablet 1 hour before bedtime.  Please maintain simple sleep hygiene. - Maintain dark and non-noisy environment in the bedroom. - Please use the bedroom for sleep and sexual activity only. - Do not use electronic devices in the bedroom. - Please take dinner at least 2 hours before bedtime. - Please avoid caffeinated products in the evening, including coffee, soft drinks. - Please try to maintain the regular sleep-wake cycle - Go to bed and wake up at the same time.   Please continue taking medications as prescribed.  Please continue to follow low carb diet and ambulate as tolerated.  Please try to cut down -> quit smoking.  Please consider getting Shingrix and Tdap vaccines at local pharmacy.

## 2022-07-24 NOTE — Assessment & Plan Note (Signed)
On Lipitor 

## 2022-08-18 DIAGNOSIS — E1169 Type 2 diabetes mellitus with other specified complication: Secondary | ICD-10-CM | POA: Diagnosis not present

## 2022-08-18 DIAGNOSIS — Z794 Long term (current) use of insulin: Secondary | ICD-10-CM | POA: Diagnosis not present

## 2022-08-19 LAB — LIPID PANEL
Chol/HDL Ratio: 2.3 ratio (ref 0.0–5.0)
Cholesterol, Total: 108 mg/dL (ref 100–199)
HDL: 48 mg/dL (ref >39)
LDL Chol Calc (NIH): 49 mg/dL (ref 0–99)
Triglycerides: 45 mg/dL (ref 0–149)
VLDL Cholesterol Cal: 11 mg/dL (ref 5–40)

## 2022-08-19 LAB — COMPREHENSIVE METABOLIC PANEL
ALT: 6 IU/L (ref 0–44)
AST: 13 IU/L (ref 0–40)
Albumin/Globulin Ratio: 1.3 (ref 1.2–2.2)
Albumin: 4.5 g/dL (ref 3.8–4.8)
Alkaline Phosphatase: 104 IU/L (ref 44–121)
BUN/Creatinine Ratio: 8 — ABNORMAL LOW (ref 10–24)
BUN: 10 mg/dL (ref 8–27)
Bilirubin Total: 0.5 mg/dL (ref 0.0–1.2)
CO2: 23 mmol/L (ref 20–29)
Calcium: 9.8 mg/dL (ref 8.6–10.2)
Chloride: 101 mmol/L (ref 96–106)
Creatinine, Ser: 1.3 mg/dL — ABNORMAL HIGH (ref 0.76–1.27)
Globulin, Total: 3.6 g/dL (ref 1.5–4.5)
Glucose: 87 mg/dL (ref 70–99)
Potassium: 4 mmol/L (ref 3.5–5.2)
Sodium: 140 mmol/L (ref 134–144)
Total Protein: 8.1 g/dL (ref 6.0–8.5)
eGFR: 59 mL/min/{1.73_m2} — ABNORMAL LOW (ref >59)

## 2022-08-26 ENCOUNTER — Ambulatory Visit (INDEPENDENT_AMBULATORY_CARE_PROVIDER_SITE_OTHER): Payer: Medicare Other | Admitting: "Endocrinology

## 2022-08-26 ENCOUNTER — Encounter: Payer: Self-pay | Admitting: "Endocrinology

## 2022-08-26 VITALS — BP 132/64 | HR 72 | Ht 67.0 in | Wt 132.8 lb

## 2022-08-26 DIAGNOSIS — Z794 Long term (current) use of insulin: Secondary | ICD-10-CM | POA: Diagnosis not present

## 2022-08-26 DIAGNOSIS — E782 Mixed hyperlipidemia: Secondary | ICD-10-CM | POA: Diagnosis not present

## 2022-08-26 DIAGNOSIS — E1169 Type 2 diabetes mellitus with other specified complication: Secondary | ICD-10-CM

## 2022-08-26 DIAGNOSIS — I10 Essential (primary) hypertension: Secondary | ICD-10-CM | POA: Diagnosis not present

## 2022-08-26 LAB — POCT GLYCOSYLATED HEMOGLOBIN (HGB A1C): HbA1c, POC (controlled diabetic range): 6.1 % (ref 0.0–7.0)

## 2022-08-26 NOTE — Patient Instructions (Signed)

## 2022-08-26 NOTE — Progress Notes (Signed)
08/26/2022   Endocrinology follow-up note    Subjective:    Patient ID: Chase Briggs, male    DOB: Jun 05, 1951, PCP Chase Halon, MD   Past Medical History:  Diagnosis Date   Diabetes mellitus    Fatty liver    GERD (gastroesophageal reflux disease)    HTN (hypertension)    Hyperlipidemia    Insomnia    Renal insufficiency    Past Surgical History:  Procedure Laterality Date   ABDOMINAL AORTOGRAM W/LOWER EXTREMITY N/A 08/22/2018   Procedure: ABDOMINAL AORTOGRAM W/LOWER EXTREMITY;  Surgeon: Chase Harman, MD;  Location: [facility] INVASIVE CV LAB;  Service: Cardiovascular;  Laterality: N/A;   BACK SURGERY     CARPAL TUNNEL WITH CUBITAL TUNNEL Right 03/21/2019   Procedure: RIGHT CARPAL TUNNEL AND CUBITAL  TUNNEL RELEASES;  Surgeon: Chase Salt, MD;  Location: [facility];  Service: Orthopedics;  Laterality: Right;  AXILLARY BLOCK   COLONOSCOPY  01/2007   Dr. Lovell Briggs, reviewed report, mild diverticulosis. Prep adequate. Next TCS due 01/2017   ESOPHAGOGASTRODUODENOSCOPY N/A 10/21/2012   IFO:YDXAJOIN GASTRITIS   MASS EXCISION Right 10/26/2012   Procedure: EXCISION MASS;  Surgeon: Chase Heading, MD;  Location: [facility];  Service: General;  Laterality: Right;   PERIPHERAL VASCULAR INTERVENTION  08/22/2018   Procedure: PERIPHERAL VASCULAR INTERVENTION;  Surgeon: Chase Harman, MD;  Location: [facility] INVASIVE CV LAB;  Service: Cardiovascular;;  bilateral external iliac stents   scalp mass  4-5 yrs ago   Dr Chase Briggs NERVE TRANSPOSITION Right 03/21/2019   Procedure: DECOMPRESSION RIGHT ULNAR NERVE;  Surgeon: Chase Salt, MD;  Location:  SURGERY CENTER;  Service: Orthopedics;  Laterality: Right;   Social History   Socioeconomic History   Marital status: Married    Spouse name: Not on file   Number of children: 2   Years of education: Not on file   Highest education level: Not on file  Occupational History   Occupation: reitred     Comment: [employer], heavy lifting  Tobacco Use   Smoking status: Every Day    Packs/day: 1.00    Years: 35.00    Total pack years: 35.00    Types: Cigarettes   Smokeless tobacco: Never  Vaping Use   Vaping Use: Never used  Substance and Sexual Activity   Alcohol use: No    Comment: hx heavy etoh x 3 yrs, quit 30+ yrs ago   Drug use: No   Sexual activity: Yes    Birth control/protection: None  Other Topics Concern   Not on file  Social History Narrative   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 Highest level of education: 12th grade. Married for 12 years. Has 2 sons (33 and 21). Lives with wife.   Social Determinants of Health   Financial Resource Strain: Not on file  Food Insecurity: Not on file  Transportation Needs: Not on file  Physical Activity: Not on file  Stress: Not on file  Social Connections: Not on file   Outpatient Encounter Medications as of 08/26/2022  Medication Sig   amLODipine (NORVASC) 10 MG tablet Take 1 tablet (10 mg total) by mouth daily.   aspirin EC 81 MG tablet Take 1 tablet (81 mg total) by mouth daily with breakfast.   atorvastatin (LIPITOR) 10 MG tablet Take 1 tablet (10 mg total) by mouth daily.   Cholecalciferol (VITAMIN D) 125 MCG (5000 UT) CAPS Take 2,000 Units by mouth daily.   cloNIDine (CATAPRES) 0.1  MG tablet Take 1 tablet (0.1 mg total) by mouth 2 (two) times daily.   clopidogrel (PLAVIX) 75 MG tablet TAKE (1) TABLET BY MOUTH ONCE DAILY.   Ferrous Sulfate (IRON) 325 (65 Fe) MG TABS Take 325 mg by mouth daily.   glucose blood test strip 1 each by Other route 2 (two) times daily. Use as instructed bid E11.65 [employer]   Lancets MISC 1 each by Does not apply route 2 (two) times daily. E11.65 Relion Premier   LANTUS SOLOSTAR 100 UNIT/ML Solostar Pen INJECT 14 UNITS UNDER THE SKIN AT BEDTIME (Patient taking differently: 14 Units daily.)   lisinopril-hydrochlorothiazide (ZESTORETIC) 20-25 MG tablet Take 1 tablet by mouth daily.    nystatin-triamcinolone ointment (MYCOLOG) Apply 1 Application topically 2 (two) times daily.   RESTASIS 0.05 % ophthalmic emulsion Place 1 drop into both eyes 2 (two) times daily as needed.   SURE COMFORT PEN NEEDLES 32G X 4 MM MISC USE ONCE DAILY   traZODone (DESYREL) 50 MG tablet Take 1.5 tablets (75 mg total) by mouth daily with breakfast.   No facility-administered encounter medications on file as of 08/26/2022.   ALLERGIES: No Known Allergies VACCINATION STATUS: Immunization History  Administered Date(s) Administered   Fluad Quad(high Dose 65+) 08/27/2021, 07/24/2022   Moderna Sars-Covid-2 Vaccination 01/04/2020, 02/06/2020   PNEUMOCOCCAL CONJUGATE-20 07/24/2022    Diabetes He presents for his follow-up diabetic visit. He has type 2 diabetes mellitus. Onset time: He was diagnosed at approximate age of 35 years. His disease course has been stable. There are no hypoglycemic associated symptoms. Pertinent negatives for hypoglycemia include no confusion, headaches, pallor or seizures. Pertinent negatives for diabetes include no chest pain, no fatigue, no polydipsia, no polyphagia, no polyuria and no weakness. There are no hypoglycemic complications. Symptoms are stable (He recently lost control of his glycemia due to exposure to heavy dose steroids.). Risk factors for coronary artery disease include diabetes mellitus, dyslipidemia, hypertension, male sex, sedentary lifestyle and tobacco exposure. He is compliant with treatment most of the time. His weight is fluctuating minimally. He is following a generally unhealthy diet. When asked about meal planning, he reported none. He has not had a previous visit with a dietitian. His home blood glucose trend is fluctuating minimally. His breakfast blood glucose range is generally 110-130 mg/dl. His bedtime blood glucose range is generally 140-180 mg/dl. His overall blood glucose range is 140-180 mg/dl. [name](Chase Briggs presents with controlled glycemic profile  both fasting and postprandial.  His point-of-care A1c is 6.1%, stable over the last 3 visits.  He did not document major hypoglycemia.     ) An ACE inhibitor/angiotensin II receptor blocker is being taken. Eye exam is current.  Hypertension This is a chronic problem. The current episode started more than 1 year ago. The problem has been gradually improving since onset. The problem is uncontrolled. Pertinent negatives include no chest pain, headaches, neck pain, palpitations or shortness of breath. Risk factors for coronary artery disease include smoking/tobacco exposure, sedentary lifestyle, dyslipidemia, family history and diabetes mellitus. Past treatments include ACE inhibitors, calcium channel blockers and central alpha agonists. The current treatment provides no improvement. Compliance problems include psychosocial issues.  Hypertensive end-organ damage includes kidney disease. Identifiable causes of hypertension include chronic renal disease.  Hyperlipidemia This is a chronic problem. The current episode started more than 1 year ago. Exacerbating diseases include chronic renal disease and diabetes. Pertinent negatives include no chest pain, myalgias or shortness of breath. Current antihyperlipidemic treatment includes statins. Compliance problems include  psychosocial issues.  Risk factors for coronary artery disease include dyslipidemia, diabetes mellitus, male sex, hypertension and a sedentary lifestyle.     Review of Systems  Constitutional:  Negative for fatigue and unexpected weight change.  HENT:  Negative for dental problem, mouth sores and trouble swallowing.   Eyes:  Negative for visual disturbance.  Respiratory:  Negative for cough, choking, chest tightness, shortness of breath and wheezing.   Cardiovascular:  Negative for chest pain, palpitations and leg swelling.  Gastrointestinal:  Negative for abdominal distention, abdominal pain, constipation, diarrhea, nausea and vomiting.   Endocrine: Negative for polydipsia, polyphagia and polyuria.  Genitourinary:  Negative for dysuria, flank pain, hematuria and urgency.  Musculoskeletal:  Negative for back pain, gait problem, myalgias and neck pain.  Skin:  Negative for pallor, rash and wound.  Neurological:  Negative for seizures, syncope, weakness, numbness and headaches.  Psychiatric/Behavioral: Negative.  Negative for confusion and dysphoric mood.     Objective:    BP 132/64   Pulse 72   Ht 5\' 7"  (1.702 m)   Wt 132 lb 12.8 oz (60.2 kg)   BMI 20.80 kg/m   Wt Readings from Last 3 Encounters:  08/26/22 132 lb 12.8 oz (60.2 kg)  07/24/22 131 lb 12.8 oz (59.8 kg)  04/30/22 130 lb 9.6 oz (59.2 kg)     Physical Exam- Limited  Constitutional:  Body mass index is 20.8 kg/m. , not in acute distress, normal state of mind  Reluctant affect, not ready to quit smoking  Complete Blood Count (Most recent): Lab Results  Component Value Date   WBC 6.6 04/22/2022   HGB 15.0 04/22/2022   HCT 43.6 04/22/2022   MCV 95.2 04/22/2022   PLT 178 04/22/2022   Chemistry (most recent): Lab Results  Component Value Date   NA 140 08/18/2022   K 4.0 08/18/2022   CL 101 08/18/2022   CO2 23 08/18/2022   BUN 10 08/18/2022   CREATININE 1.30 (H) 08/18/2022   Diabetic Labs (most recent): Lab Results  Component Value Date   HGBA1C 6.1 08/26/2022   HGBA1C 6.1 02/24/2022   HGBA1C 6.5 08/26/2021   MICROALBUR 150 08/23/2020   MICROALBUR 9.9 08/20/2017   Lipid Panel     Component Value Date/Time   CHOL 108 08/18/2022 0803   TRIG 45 08/18/2022 0803   HDL 48 08/18/2022 0803   CHOLHDL 2.3 08/18/2022 0803   CHOLHDL 2.1 04/23/2022 0142   VLDL 4 04/23/2022 0142   LDLCALC 49 08/18/2022 0803   LDLCALC 56 12/12/2019 0905     Assessment & Plan:   1. Type 2 diabetes mellitus with microalbuminuria , with long-term current use of insulin (HCC)  Gohan presents with controlled glycemic profile both fasting and postprandial.   His point-of-care A1c is 6.1%, stable over the last 3 visits.  He did not document major hypoglycemia.   He does not have any hypoglycemic episodes.  He is a chronic heavy smoker, remains at a high risk for more acute and chronic complications of diabetes which include CAD, CVA, CKD, retinopathy, and neuropathy. These are all discussed in detail with the patient.    Recent labs reviewed, stage 3 renal insufficiency. - I have re-counseled the patient on diet management  by adopting a carbohydrate restricted / protein rich  Diet.  - he acknowledges that there is a room for improvement in his food and drink choices. - Suggestion is made for him to avoid simple carbohydrates  from his diet including Cakes, Sweet  Desserts, Ice Cream, Soda (diet and regular), Sweet Tea, Candies, Chips, Cookies, Store Bought Juices, Alcohol in Excess of  1-2 drinks a day, Artificial Sweeteners,  Coffee Creamer, and "Sugar-free" Products, Lemonade. This will help patient to have more stable blood glucose profile and potentially avoid unintended weight gain.  - Patient is advised to stick to a routine mealtimes to eat 3 meals  a day and avoid unnecessary snacks ( to snack only to correct hypoglycemia).  - I have approached patient with the following individualized plan to manage diabetes and patient agrees.  - He has benefited from insulin initiation.   - Based on  his presentation with controlled glycemic profile, he will not need prandial insulin for now.  He is advised to continue Lantus 14 units daily at breakfast.     He is encouraged and agrees to to continue monitoring blood glucose at least twice a day-daily before breakfast and at bedtime.    - He will call clinic if he registers blood glucose readings less than 70 or greater than 200 mg/dL.  -   He has stage 3 renal insufficiency which has improved over time, will stay off of metformin for now.   If his renal insufficiency continues, he will need consultation  with nephrology.    - Patient is not a candidate for  incretin therapy, nor SGLT2 inhibitors. - Patient specific target  for A1c; LDL, HDL, Triglycerides, were discussed in detail.  2) BP/HTN:  -His blood pressure is controlled to target.  He has 3 medications including losartan, clonidine, and amlodipine.  He continues to smoke heavily.   The patient was counseled on the dangers of tobacco use, and was advised to quit.  Reviewed strategies to maximize success, including removing cigarettes and smoking materials from environment.   3) Lipids/HPL: His most recent lipid panel showed LDL controlled at 44.   He is not on statins for now.    4)  Weight/Diet: His BMI is 20.8-he is not a candidate for weight loss.    - He would benefit from addition of more complex carbohydrates, information provided. CDE consult is requested.  5. Vitamin D deficiency  - He he is on ongoing supplement with vitamin D3 5000 units daily.    6) Chronic Care/Health Maintenance:  -Patient is on ACEI/ARB and Statin medications and encouraged to continue to follow up with Ophthalmology, Podiatrist at least yearly or according to recommendations, and advised to  Quit  smoking. I have recommended yearly flu vaccine and pneumonia vaccination at least every 5 years; moderate intensity exercise for up to 150 minutes weekly; and  sleep for at least 7 hours a day. -He is counseled extensively for smoking cessation.  - I advised patient to maintain close follow up with Chase Halon, MD for primary care needs. - I have advised him to avoid over-the-counter pain medications to avoid additional risk for renal insufficiency.   I spent 26 minutes in the care of the patient today including review of labs from CMP, Lipids, Thyroid Function, Hematology (current and previous including abstractions from other facilities); face-to-face time discussing  his blood glucose readings/logs, discussing hypoglycemia and hyperglycemia  episodes and symptoms, medications doses, his options of short and long term treatment based on the latest standards of care / guidelines;  discussion about incorporating lifestyle medicine;  and documenting the encounter. Risk reduction counseling performed per USPSTF guidelines to reduce  cardiovascular risk factors.     Please refer to Patient Instructions for  Blood Glucose Monitoring and Insulin/Medications Dosing Guide"  in media tab for additional information. Please  also refer to " Patient Self Inventory" in the Media  tab for reviewed elements of pertinent patient history.  Roseanna Rainbow Cutillo participated in the discussions, expressed understanding, and voiced agreement with the above plans.  All questions were answered to his satisfaction. he is encouraged to contact clinic should he have any questions or concerns prior to his return visit.     Follow up plan: -Return in about 6 months (around 02/25/2023) for Bring Meter/CGM Device/Logs- A1c in Office.  Marquis Lunch, MD Phone: [phone]  Fax: [phone]  This note was partially dictated with voice recognition software. Similar sounding words can be transcribed inadequately or may not  be corrected upon review.  08/26/2022, 8:10 PM

## 2022-09-02 ENCOUNTER — Ambulatory Visit (HOSPITAL_COMMUNITY)
Admission: RE | Admit: 2022-09-02 | Discharge: 2022-09-02 | Disposition: A | Discharge status: Home / Self Care | Payer: Medicare Other | Source: Ambulatory Visit | Attending: Internal Medicine | Admitting: Internal Medicine

## 2022-09-02 DIAGNOSIS — F17210 Nicotine dependence, cigarettes, uncomplicated: Secondary | ICD-10-CM | POA: Diagnosis not present

## 2022-09-02 DIAGNOSIS — F17218 Nicotine dependence, cigarettes, with other nicotine-induced disorders: Secondary | ICD-10-CM | POA: Diagnosis not present

## 2022-09-02 DIAGNOSIS — Z122 Encounter for screening for malignant neoplasm of respiratory organs: Secondary | ICD-10-CM | POA: Diagnosis not present

## 2022-09-15 ENCOUNTER — Ambulatory Visit (INDEPENDENT_AMBULATORY_CARE_PROVIDER_SITE_OTHER): Payer: Medicare Other | Admitting: Internal Medicine

## 2022-09-15 ENCOUNTER — Encounter: Payer: Self-pay | Admitting: Internal Medicine

## 2022-09-15 VITALS — BP 148/70 | HR 67 | Ht 67.0 in | Wt 131.2 lb

## 2022-09-15 DIAGNOSIS — M5431 Sciatica, right side: Secondary | ICD-10-CM

## 2022-09-15 DIAGNOSIS — M5432 Sciatica, left side: Secondary | ICD-10-CM

## 2022-09-15 DIAGNOSIS — M5126 Other intervertebral disc displacement, lumbar region: Secondary | ICD-10-CM

## 2022-09-15 DIAGNOSIS — I10 Essential (primary) hypertension: Secondary | ICD-10-CM | POA: Diagnosis not present

## 2022-09-15 MED ORDER — KETOROLAC TROMETHAMINE 60 MG/2ML IM SOLN
60.0000 mg | Freq: Once | INTRAMUSCULAR | Status: AC
  Administered 2022-09-15: 60 mg via INTRAMUSCULAR

## 2022-09-15 MED ORDER — METHYLPREDNISOLONE ACETATE 80 MG/ML IJ SUSP: 80.0000 mg | Freq: Once | INTRAMUSCULAR | Status: DC

## 2022-09-15 MED ORDER — MELOXICAM 15 MG PO TABS: 15.0000 mg | ORAL_TABLET | Freq: Every day | ORAL | 0 refills | Status: DC

## 2022-09-15 MED ORDER — CYCLOBENZAPRINE HCL 10 MG PO TABS: 10.0000 mg | ORAL_TABLET | Freq: Two times a day (BID) | ORAL | 0 refills | Status: DC | PRN

## 2022-09-15 MED ORDER — METHYLPREDNISOLONE ACETATE 80 MG/ML IJ SUSP
40.0000 mg | Freq: Once | INTRAMUSCULAR | Status: AC
  Administered 2022-09-15: 40 mg via INTRAMUSCULAR

## 2022-09-15 NOTE — Patient Instructions (Signed)
Please take Mobic as prescribed for pain.  Please take Flexeril as needed for muscle spasms.  Please avoid heavy lifting and frequent bending.

## 2022-09-18 NOTE — Assessment & Plan Note (Signed)
Likely has herniated disc considering his radiating pain Depo-Medrol and Toradol IM today Mobic as needed for pain Flexeril as needed for muscle spasms Avoid heavy lifting and frequent bending Simple back exercises advised, material provided If persistent, will get imaging and/or PT

## 2022-09-18 NOTE — Assessment & Plan Note (Signed)
BP Readings from Last 1 Encounters:  09/15/22 (!) 148/70   Elevated today due to pain Usually remains well-controlled with amlodipine, lisinopril-HCTZ and clonidine Followed by cardiology Counseled for compliance with the medications Advised DASH diet and moderate exercise/walking, at least 150 mins/week

## 2022-09-18 NOTE — Progress Notes (Signed)
Acute Office Visit  Subjective:    Patient ID: Chase Briggs, male    DOB: [birth year], 72 y.o.   MRN: [id number]  Chief Complaint  Patient presents with   Back Pain    Patient has been having back pain for a week now, radiating down legs and side, is starting to affect his sleep     HPI Patient is in today for complain of the acute onset low back pain since 09/08/22.  He had tried to move heavy generator on that day.  He has constant, sharp pain, with radiating to bilateral LE and has been affecting his sleep as well.  He denies any numbness or tingling of the LE.  He has had similar pain in the past, but used to resolve on its own.  His BP was elevated today, likely due to pain.  He denies any headache, dizziness, chest pain, dyspnea or palpitations.  Past Medical History:  Diagnosis Date   Diabetes mellitus    Fatty liver    GERD (gastroesophageal reflux disease)    HTN (hypertension)    Hyperlipidemia    Insomnia    Renal insufficiency     Past Surgical History:  Procedure Laterality Date   ABDOMINAL AORTOGRAM W/LOWER EXTREMITY N/A 08/22/2018   Procedure: ABDOMINAL AORTOGRAM W/LOWER EXTREMITY;  Surgeon: [name], MD;  Location: [facility] INVASIVE CV LAB;  Service: Cardiovascular;  Laterality: N/A;   BACK SURGERY     CARPAL TUNNEL WITH CUBITAL TUNNEL Right 03/21/2019   Procedure: RIGHT CARPAL TUNNEL AND CUBITAL  TUNNEL RELEASES;  Surgeon: [name], MD;  Location: [facility];  Service: Orthopedics;  Laterality: Right;  AXILLARY BLOCK   COLONOSCOPY  01/2007   Dr. [name], reviewed report, mild diverticulosis. Prep adequate. Next TCS due 01/2017   ESOPHAGOGASTRODUODENOSCOPY N/A 10/21/2012   [id number] GASTRITIS   MASS EXCISION Right 10/26/2012   Procedure: EXCISION MASS;  Surgeon: [name], MD;  Location: [facility];  Service: General;  Laterality: Right;   PERIPHERAL VASCULAR INTERVENTION  08/22/2018   Procedure: PERIPHERAL VASCULAR  INTERVENTION;  Surgeon: [name], MD;  Location: [facility] INVASIVE CV LAB;  Service: Cardiovascular;;  bilateral external iliac stents   scalp mass  4-5 yrs ago   Dr Angelique Holm NERVE TRANSPOSITION Right 03/21/2019   Procedure: DECOMPRESSION RIGHT ULNAR NERVE;  Surgeon: [name], MD;  Location: [facility];  Service: Orthopedics;  Laterality: Right;    Family History  Problem Relation Age of Onset   Diabetes Mother    Diabetes Father    Diabetes Brother    Colon cancer Neg Hx    Liver disease Neg Hx     Social History   Socioeconomic History   Marital status: Married    Spouse name: Not on file   Number of children: 2   Years of education: Not on file   Highest education level: Not on file  Occupational History   Occupation: reitred    Comment: [employer], heavy lifting  Tobacco Use   Smoking status: Every Day    Packs/day: 1.00    Years: 35.00    Total pack years: 35.00    Types: Cigarettes   Smokeless tobacco: Never  Vaping Use   Vaping Use: Never used  Substance and Sexual Activity   Alcohol use: No    Comment: hx heavy etoh x 3 yrs, quit 30+ yrs ago   Drug use: No   Sexual activity: Yes  Birth control/protection: None  Other Topics Concern   Not on file  Social History Narrative   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 Married for 12 years. Has 2 sons (37 and 57). Lives with wife.   Social Determinants of Health   Financial Resource Strain: Not on file  Food Insecurity: Not on file  Transportation Needs: Not on file  Physical Activity: Not on file  Stress: Not on file  Social Connections: Not on file  Intimate Partner Violence: Not on file    Outpatient Medications Prior to Visit  Medication Sig Dispense Refill   amLODipine (NORVASC) 10 MG tablet Take 1 tablet (10 mg total) by mouth daily. 90 tablet 3   aspirin EC 81 MG tablet Take 1 tablet (81 mg total) by mouth daily with breakfast.  30 tablet 11   atorvastatin (LIPITOR) 10 MG tablet Take 1 tablet (10 mg total) by mouth daily. 90 tablet 3   Cholecalciferol (VITAMIN D) 125 MCG (5000 UT) CAPS Take 2,000 Units by mouth daily. 30 capsule 3   cloNIDine (CATAPRES) 0.1 MG tablet Take 1 tablet (0.1 mg total) by mouth 2 (two) times daily. 180 tablet 3   clopidogrel (PLAVIX) 75 MG tablet TAKE (1) TABLET BY MOUTH ONCE DAILY. 90 tablet 3   Ferrous Sulfate (IRON) 325 (65 Fe) MG TABS Take 325 mg by mouth daily.     glucose blood test strip 1 each by Other route 2 (two) times daily. Use as instructed bid E11.65 Relion Premier 100 each 5   Lancets MISC 1 each by Does not apply route 2 (two) times daily. E11.65 Relion Premier 100 each 5   LANTUS SOLOSTAR 100 UNIT/ML Solostar Pen INJECT 14 UNITS UNDER THE SKIN AT BEDTIME (Patient taking differently: 14 Units daily.) 15 mL 2   lisinopril-hydrochlorothiazide (ZESTORETIC) 20-25 MG tablet Take 1 tablet by mouth daily.     nystatin-triamcinolone ointment (MYCOLOG) Apply 1 Application topically 2 (two) times daily. 30 g 5   RESTASIS 0.05 % ophthalmic emulsion Place 1 drop into both eyes 2 (two) times daily as needed.     SURE COMFORT PEN NEEDLES 32G X 4 MM MISC USE ONCE DAILY 100 each 3   traZODone (DESYREL) 50 MG tablet Take 1.5 tablets (75 mg total) by mouth daily with breakfast. 135 tablet 1   No facility-administered medications prior to visit.    No Known Allergies  Review of Systems  Constitutional:  Negative for chills and fever.  HENT:  Negative for congestion, postnasal drip and sore throat.   Respiratory:  Negative for cough and shortness of breath.   Cardiovascular:  Negative for chest pain and palpitations.  Gastrointestinal:  Positive for constipation. Negative for diarrhea and vomiting.  Genitourinary:  Negative for dysuria and hematuria.  Musculoskeletal:  Positive for back pain. Negative for neck pain and neck stiffness.  Skin:  Negative for rash.  Neurological:  Negative  for dizziness and weakness.  Psychiatric/Behavioral:  Positive for sleep disturbance. Negative for agitation and behavioral problems.        Objective:    Physical Exam Vitals reviewed.  Constitutional:      General: He is not in acute distress.    Appearance: He is not diaphoretic.  HENT:     Head: Normocephalic and atraumatic.     Nose: No congestion.     Mouth/Throat:     Mouth: Mucous membranes are moist.  Eyes:     General: No scleral  icterus.    Extraocular Movements: Extraocular movements intact.  Cardiovascular:     Rate and Rhythm: Normal rate and regular rhythm.     Pulses: Normal pulses.     Heart sounds: Normal heart sounds. No murmur heard. Pulmonary:     Breath sounds: Normal breath sounds. No wheezing or rales.  Musculoskeletal:     Cervical back: Neck supple. No tenderness.     Lumbar back: Tenderness present. Decreased range of motion.     Right lower leg: No edema.     Left lower leg: No edema.  Skin:    General: Skin is warm.     Findings: No rash.     Comments: Clubbing of fingernails  Neurological:     General: No focal deficit present.     Mental Status: He is alert and oriented to person, place, and time.  Psychiatric:        Mood and Affect: Mood normal.        Behavior: Behavior normal.     BP (!) 148/70 (BP Location: Right Arm, Cuff Size: Normal)   Pulse 67   Ht 5\' 7"  (1.702 m)   Wt 131 lb 3.2 oz (59.5 kg)   SpO2 96%   BMI 20.55 kg/m  Wt Readings from Last 3 Encounters:  09/15/22 131 lb 3.2 oz (59.5 kg)  08/26/22 132 lb 12.8 oz (60.2 kg)  07/24/22 131 lb 12.8 oz (59.8 kg)        Assessment & Plan:   Problem List Items Addressed This Visit       Cardiovascular and Mediastinum   Essential hypertension    BP Readings from Last 1 Encounters:  09/15/22 (!) 148/70  Elevated today due to pain Usually remains well-controlled with amlodipine, lisinopril-HCTZ and clonidine Followed by cardiology Counseled for compliance with the  medications Advised DASH diet and moderate exercise/walking, at least 150 mins/week        Nervous and Auditory   Bilateral sciatica    Back pain with radiating to bilateral LE SLR positive on right side Depo-Medrol and Toradol IM today Mobic as needed for pain Simple exercises advised, material provided      Relevant Medications   cyclobenzaprine (FLEXERIL) 10 MG tablet     Musculoskeletal and Integument   Lumbar herniated disc - Primary    Likely has herniated disc considering his radiating pain Depo-Medrol and Toradol IM today Mobic as needed for pain Flexeril as needed for muscle spasms Avoid heavy lifting and frequent bending Simple back exercises advised, material provided If persistent, will get imaging and/or PT      Relevant Medications   meloxicam (MOBIC) 15 MG tablet   cyclobenzaprine (FLEXERIL) 10 MG tablet     Meds ordered this encounter  Medications   meloxicam (MOBIC) 15 MG tablet    Sig: Take 1 tablet (15 mg total) by mouth daily.    Dispense:  30 tablet    Refill:  0   cyclobenzaprine (FLEXERIL) 10 MG tablet    Sig: Take 1 tablet (10 mg total) by mouth 2 (two) times daily as needed for muscle spasms.    Dispense:  30 tablet    Refill:  0   ketorolac (TORADOL) injection 60 mg   DISCONTD: methylPREDNISolone acetate (DEPO-MEDROL) injection 80 mg   methylPREDNISolone acetate (DEPO-MEDROL) injection 40 mg     Lindell Spar, MD

## 2022-09-18 NOTE — Assessment & Plan Note (Addendum)
Back pain with radiating to bilateral LE SLR positive on right side Depo-Medrol and Toradol IM today Mobic as needed for pain Simple exercises advised, material provided

## 2022-09-21 ENCOUNTER — Ambulatory Visit: Payer: Medicare Other | Attending: Cardiology | Admitting: Cardiology

## 2022-09-21 ENCOUNTER — Encounter: Payer: Self-pay | Admitting: Cardiology

## 2022-09-21 VITALS — BP 165/70 | HR 60 | Ht 67.0 in | Wt 130.2 lb

## 2022-09-21 DIAGNOSIS — I10 Essential (primary) hypertension: Secondary | ICD-10-CM | POA: Diagnosis not present

## 2022-09-21 DIAGNOSIS — Z79899 Other long term (current) drug therapy: Secondary | ICD-10-CM | POA: Diagnosis not present

## 2022-09-21 DIAGNOSIS — I739 Peripheral vascular disease, unspecified: Secondary | ICD-10-CM | POA: Insufficient documentation

## 2022-09-21 DIAGNOSIS — R0789 Other chest pain: Secondary | ICD-10-CM | POA: Diagnosis not present

## 2022-09-21 MED ORDER — HYDROCHLOROTHIAZIDE 25 MG PO TABS
25.0000 mg | ORAL_TABLET | Freq: Every day | ORAL | 6 refills | Status: DC
Start: 2022-09-21 — End: 2023-02-10

## 2022-09-21 MED ORDER — LISINOPRIL 40 MG PO TABS: 40.0000 mg | ORAL_TABLET | Freq: Every day | ORAL | 6 refills | Status: DC

## 2022-09-21 NOTE — Progress Notes (Signed)
Clinical Summary Chase Briggs is a 72 y.o.male seen today for follow up of the following medical problems.   1. Chest pain - occasional left sided pain. Dull pain, 1/10 in severity. Sporadic in frequency, usually with upper body activity. Better with rubbing area. Lasts a few minutes. - walks daily x 20 -30 minutes, no exertional symptoms   CAD risk factors: HTN, HL, DM2, PAD, tobacco    -admit 04/2022 with chest pain.  04/2022 CT PE negative for PE  04/2022 nuclear stress: inferior defect likely artifact, cannot completely exclude scar but no current ischemia 04/2022 echo: LVEF 60-65%, no WMAs, grade I dd  - no recent chest pains.      2. PAD - followed by vascular - prior stent to right external iliac, left external iliac - he is on a statin atorva 10mg  daily, LDL was 56 at last check.  08/2022 TC 108 TG 45 HDL 48 LDL 49  3. HTN - complinat with meds   Past Medical History:  Diagnosis Date   Diabetes mellitus    Fatty liver    GERD (gastroesophageal reflux disease)    HTN (hypertension)    Hyperlipidemia    Insomnia    Renal insufficiency      No Known Allergies   Current Outpatient Medications  Medication Sig Dispense Refill   amLODipine (NORVASC) 10 MG tablet Take 1 tablet (10 mg total) by mouth daily. 90 tablet 3   aspirin EC 81 MG tablet Take 1 tablet (81 mg total) by mouth daily with breakfast. 30 tablet 11   atorvastatin (LIPITOR) 10 MG tablet Take 1 tablet (10 mg total) by mouth daily. 90 tablet 3   Cholecalciferol (VITAMIN D) 125 MCG (5000 UT) CAPS Take 2,000 Units by mouth daily. 30 capsule 3   cloNIDine (CATAPRES) 0.1 MG tablet Take 1 tablet (0.1 mg total) by mouth 2 (two) times daily. 180 tablet 3   clopidogrel (PLAVIX) 75 MG tablet TAKE (1) TABLET BY MOUTH ONCE DAILY. 90 tablet 3   cyclobenzaprine (FLEXERIL) 10 MG tablet Take 1 tablet (10 mg total) by mouth 2 (two) times daily as needed for muscle spasms. 30 tablet 0   Ferrous Sulfate (IRON)  325 (65 Fe) MG TABS Take 325 mg by mouth daily.     glucose blood test strip 1 each by Other route 2 (two) times daily. Use as instructed bid E11.65 Relion Premier 100 each 5   Lancets MISC 1 each by Does not apply route 2 (two) times daily. E11.65 Relion Premier 100 each 5   LANTUS SOLOSTAR 100 UNIT/ML Solostar Pen INJECT 14 UNITS UNDER THE SKIN AT BEDTIME (Patient taking differently: 14 Units daily.) 15 mL 2   lisinopril-hydrochlorothiazide (ZESTORETIC) 20-25 MG tablet Take 1 tablet by mouth daily.     meloxicam (MOBIC) 15 MG tablet Take 1 tablet (15 mg total) by mouth daily. 30 tablet 0   nystatin-triamcinolone ointment (MYCOLOG) Apply 1 Application topically 2 (two) times daily. 30 g 5   RESTASIS 0.05 % ophthalmic emulsion Place 1 drop into both eyes 2 (two) times daily as needed.     SURE COMFORT PEN NEEDLES 32G X 4 MM MISC USE ONCE DAILY 100 each 3   traZODone (DESYREL) 50 MG tablet Take 1.5 tablets (75 mg total) by mouth daily with breakfast. 135 tablet 1   No current facility-administered medications for this visit.     Past Surgical History:  Procedure Laterality Date   ABDOMINAL AORTOGRAM W/LOWER  EXTREMITY N/A 08/22/2018   Procedure: ABDOMINAL AORTOGRAM W/LOWER EXTREMITY;  Surgeon: [name], MD;  Location: [facility] INVASIVE CV LAB;  Service: Cardiovascular;  Laterality: N/A;   BACK SURGERY     CARPAL TUNNEL WITH CUBITAL TUNNEL Right 03/21/2019   Procedure: RIGHT CARPAL TUNNEL AND CUBITAL  TUNNEL RELEASES;  Surgeon: [name], MD;  Location: [facility];  Service: Orthopedics;  Laterality: Right;  AXILLARY BLOCK   COLONOSCOPY  01/2007   Dr. [name], reviewed report, mild diverticulosis. Prep adequate. Next TCS due 01/2017   ESOPHAGOGASTRODUODENOSCOPY N/A 10/21/2012   [id number] GASTRITIS   MASS EXCISION Right 10/26/2012   Procedure: EXCISION MASS;  Surgeon: [name], MD;  Location: [facility];  Service: General;  Laterality: Right;   PERIPHERAL VASCULAR  INTERVENTION  08/22/2018   Procedure: PERIPHERAL VASCULAR INTERVENTION;  Surgeon: [name], MD;  Location: [facility] INVASIVE CV LAB;  Service: Cardiovascular;;  bilateral external iliac stents   scalp mass  4-5 yrs ago   Dr Angelique Holm NERVE TRANSPOSITION Right 03/21/2019   Procedure: DECOMPRESSION RIGHT ULNAR NERVE;  Surgeon: [name], MD;  Location: [facility];  Service: Orthopedics;  Laterality: Right;     No Known Allergies    Family History  Problem Relation Age of Onset   Diabetes Mother    Diabetes Father    Diabetes Brother    Colon cancer Neg Hx    Liver disease Neg Hx      Social History Mr. Radin reports that he has been smoking cigarettes. He has a 35.00 pack-year smoking history. He has never used smokeless tobacco. Mr. Coone reports no history of alcohol use.   Review of Systems CONSTITUTIONAL: No weight loss, fever, chills, weakness or fatigue.  HEENT: Eyes: No visual loss, blurred vision, double vision or yellow sclerae.No hearing loss, sneezing, congestion, runny nose or sore throat.  SKIN: No rash or itching.  CARDIOVASCULAR: per hpi RESPIRATORY: No shortness of breath, cough or sputum.  GASTROINTESTINAL: No anorexia, nausea, vomiting or diarrhea. No abdominal pain or blood.  GENITOURINARY: No burning on urination, no polyuria NEUROLOGICAL: No headache, dizziness, syncope, paralysis, ataxia, numbness or tingling in the extremities. No change in bowel or bladder control.  MUSCULOSKELETAL: No muscle, back pain, joint pain or stiffness.  LYMPHATICS: No enlarged nodes. No history of splenectomy.  PSYCHIATRIC: No history of depression or anxiety.  ENDOCRINOLOGIC: No reports of sweating, cold or heat intolerance. No polyuria or polydipsia.  Marland Kitchen   Physical Examination Today's Vitals   09/21/22 0813  BP: (!) 160/70  Pulse: 60  SpO2: 98%  Weight: 130 lb 3.2 oz (59.1 kg)  Height: 5\' 7"  (1.702 m)   Body mass index is  20.39 kg/m.  Gen: resting comfortably, no acute distress HEENT: no scleral icterus, pupils equal round and reactive, no palptable cervical adenopathy,  CV: RRR, no m/r/g, no jvd Resp: Clear to auscultation bilaterally GI: abdomen is soft, non-tender, non-distended, normal bowel sounds, no hepatosplenomegaly MSK: extremities are warm, no edema.  Skin: warm, no rash Neuro:  no focal deficits Psych: appropriate affect   Diagnostic Studies  04/2022 nuclear stress Findings are equivocal. The study is low risk.   No ST deviation was noted. Arrhythmias during stress: occasional PACs, rare PVCs. The ECG was negative for ischemia.   LV perfusion is abnormal.  Suspected diaphragmatic attenuation affecting the inferior wall predominantly on rest imaging, cannot completely exclude scar but no large region of ischemia.   Left ventricular  function is low normal. Nuclear stress EF: 51 %.   Diaphragmatic attenuation artifact was present. Image quality affected due to significant extracardiac activity near the inferior wall.   Overall low risk study with suspected inferior diaphragmatic attenuation, less likely scar.  No large ischemic territories and low normal LVEF at 51%.   04/2022 echo IMPRESSIONS     1. Left ventricular ejection fraction, by estimation, is 60 to 65%. The  left ventricle has normal function. The left ventricle has no regional  wall motion abnormalities. Left ventricular diastolic parameters are  consistent with Grade I diastolic  dysfunction (impaired relaxation).   2. Right ventricular systolic function is normal. The right ventricular  size is normal. There is mildly elevated pulmonary artery systolic  pressure.   3. The mitral valve is normal in structure. Trivial mitral valve  regurgitation. No evidence of mitral stenosis.   4. The aortic valve is tricuspid. Aortic valve regurgitation is not  visualized. No aortic stenosis is present.   5. The inferior vena cava is  normal in size with greater than 50%  respiratory variability, suggesting right atrial pressure of 3 mmHg.   Assessment and Plan   1. Chest pain - extensive workup as outlined above without evidence of significant cardiac pathology - symptoms have resolved - monitor at this time  2. HTN - above goal, increase lisinopril/hctz combo to 40mg /25mg  daily, check bmet 2 weeks  3. PAD - per vascular, he is on DAPT by vascular and not for cardiac reason. Defer duration to vascular - continue statin, ACEi       , M.D.

## 2022-09-21 NOTE — Patient Instructions (Signed)
Medication Instructions:  Increase your Lisinopril / HCTZ to 40/25mg  daily  Continue all other medications.     Labwork: BMET  - order given today Please do in 2 weeks Office will contact with results via phone, letter or mychart.     Testing/Procedures: none  Follow-Up: 6 months   Any Other Special Instructions Will Be Listed Below (If Applicable).   If you need a refill on your cardiac medications before your next appointment, please call your pharmacy.

## 2022-09-22 DIAGNOSIS — E119 Type 2 diabetes mellitus without complications: Secondary | ICD-10-CM | POA: Diagnosis not present

## 2022-09-22 LAB — HM DIABETES EYE EXAM

## 2022-09-28 DIAGNOSIS — M79674 Pain in right toe(s): Secondary | ICD-10-CM | POA: Diagnosis not present

## 2022-09-28 DIAGNOSIS — L851 Acquired keratosis [keratoderma] palmaris et plantaris: Secondary | ICD-10-CM | POA: Diagnosis not present

## 2022-09-28 DIAGNOSIS — I739 Peripheral vascular disease, unspecified: Secondary | ICD-10-CM | POA: Diagnosis not present

## 2022-09-28 DIAGNOSIS — E1151 Type 2 diabetes mellitus with diabetic peripheral angiopathy without gangrene: Secondary | ICD-10-CM | POA: Diagnosis not present

## 2022-09-28 DIAGNOSIS — B351 Tinea unguium: Secondary | ICD-10-CM | POA: Diagnosis not present

## 2022-10-15 ENCOUNTER — Emergency Department (HOSPITAL_COMMUNITY)
Admission: EM | Admit: 2022-10-15 | Discharge: 2022-10-15 | Disposition: A | Discharge status: Home / Self Care | Payer: Medicare Other | Attending: Emergency Medicine | Admitting: Emergency Medicine

## 2022-10-15 ENCOUNTER — Other Ambulatory Visit: Payer: Self-pay

## 2022-10-15 ENCOUNTER — Encounter (HOSPITAL_COMMUNITY): Payer: Self-pay

## 2022-10-15 ENCOUNTER — Emergency Department (HOSPITAL_COMMUNITY): Payer: Medicare Other

## 2022-10-15 DIAGNOSIS — M79661 Pain in right lower leg: Secondary | ICD-10-CM | POA: Diagnosis not present

## 2022-10-15 DIAGNOSIS — M79604 Pain in right leg: Secondary | ICD-10-CM | POA: Diagnosis not present

## 2022-10-15 DIAGNOSIS — I10 Essential (primary) hypertension: Secondary | ICD-10-CM | POA: Diagnosis not present

## 2022-10-15 DIAGNOSIS — E119 Type 2 diabetes mellitus without complications: Secondary | ICD-10-CM | POA: Insufficient documentation

## 2022-10-15 DIAGNOSIS — M79662 Pain in left lower leg: Secondary | ICD-10-CM | POA: Diagnosis not present

## 2022-10-15 DIAGNOSIS — Z79899 Other long term (current) drug therapy: Secondary | ICD-10-CM | POA: Diagnosis not present

## 2022-10-15 DIAGNOSIS — M79605 Pain in left leg: Secondary | ICD-10-CM | POA: Diagnosis not present

## 2022-10-15 DIAGNOSIS — Z794 Long term (current) use of insulin: Secondary | ICD-10-CM | POA: Insufficient documentation

## 2022-10-15 DIAGNOSIS — Z7902 Long term (current) use of antithrombotics/antiplatelets: Secondary | ICD-10-CM | POA: Diagnosis not present

## 2022-10-15 DIAGNOSIS — Z7982 Long term (current) use of aspirin: Secondary | ICD-10-CM | POA: Diagnosis not present

## 2022-10-15 MED ORDER — ACETAMINOPHEN 325 MG PO TABS
650.0000 mg | ORAL_TABLET | Freq: Once | ORAL | Status: AC
  Administered 2022-10-15: 650 mg via ORAL
  Filled 2022-10-15: qty 2

## 2022-10-15 NOTE — Discharge Instructions (Signed)
You have been seen today for your complaint of pain to both of your legs. Your imaging was reassuring and showed no signs of blood clot. Your discharge medications include Tylenol.  You may take up to 1000 mg of Tylenol every 8 hours. Follow up with: Your primary care provider in 1 week for reevaluation Please seek immediate medical care if you develop any of the following symptoms: Chest pain, shortness of breath, numbness, weakness, tingling, coughing blood At this time there does not appear to be the presence of an emergent medical condition, however there is always the potential for conditions to change. Please read and follow the below instructions.  Do not take your medicine if  develop an itchy rash, swelling in your mouth or lips, or difficulty breathing; call 911 and seek immediate emergency medical attention if this occurs.  You may review your lab tests and imaging results in their entirety on your MyChart account.  Please discuss all results of fully with your primary care provider and other specialist at your follow-up visit.  Note: Portions of this text may have been transcribed using voice recognition software. Every effort was made to ensure accuracy; however, inadvertent computerized transcription errors may still be present.

## 2022-10-15 NOTE — ED Provider Notes (Signed)
[facility] Provider Note   CSN: [id number] Arrival date & time: 10/15/22  1251     History  Chief Complaint  Patient presents with   Leg Pain    Chase Briggs is a 72 y.o. male.  With history of hypertension, diabetes, hyperlipidemia, renal insufficiency, bilateral lower extremity stent placement presents to the ED for evaluation of bilateral lower extremity aching.  He states that he was normal this morning was around home doing errands.  When he got home, he noticed increasing aching in his feet that radiated up to his knees bilaterally.  It is now worsened with movement.  He has not taken anything for his pain today.  He does take Plavix due to his bilateral stents.  Denies history of DVT.  Denies swelling, numbness or tingling.  Denies chest pain or shortness of breath.  Rates the pain at a 9 out of 10.   Leg Pain      Home Medications Prior to Admission medications   Medication Sig Start Date End Date Taking? Authorizing Provider  amLODipine (NORVASC) 10 MG tablet Take 1 tablet (10 mg total) by mouth daily. 04/23/22   Roxan Hockey, MD  aspirin EC 81 MG tablet Take 1 tablet (81 mg total) by mouth daily with breakfast. 04/23/22   Denton Brick, Courage, MD  atorvastatin (LIPITOR) 10 MG tablet Take 1 tablet (10 mg total) by mouth daily. 04/23/22   Roxan Hockey, MD  Cholecalciferol (VITAMIN D) 125 MCG (5000 UT) CAPS Take 2,000 Units by mouth daily. 04/23/22   Roxan Hockey, MD  cloNIDine (CATAPRES) 0.1 MG tablet Take 1 tablet (0.1 mg total) by mouth 2 (two) times daily. 04/23/22   Roxan Hockey, MD  clopidogrel (PLAVIX) 75 MG tablet TAKE (1) TABLET BY MOUTH ONCE DAILY. 04/23/22   Roxan Hockey, MD  cyclobenzaprine (FLEXERIL) 10 MG tablet Take 1 tablet (10 mg total) by mouth 2 (two) times daily as needed for muscle spasms. 09/15/22   Lindell Spar, MD  Ferrous Sulfate (IRON) 325 (65 Fe) MG TABS Take 325 mg by mouth daily.     [provider]  glucose blood test strip 1 each by Other route 2 (two) times daily. Use as instructed bid E11.65 [facility] 05/15/19   Cassandria Anger, MD  hydrochlorothiazide (HYDRODIURIL) 25 MG tablet Take 1 tablet (25 mg total) by mouth daily. 09/21/22   Arnoldo Lenis, MD  Lancets MISC 1 each by Does not apply route 2 (two) times daily. E11.65 Relion Premier 05/15/19   Cassandria Anger, MD  LANTUS SOLOSTAR 100 UNIT/ML Solostar Pen INJECT 14 UNITS UNDER THE SKIN AT BEDTIME Patient taking differently: 14 Units daily. 03/13/22   Cassandria Anger, MD  lisinopril (ZESTRIL) 40 MG tablet Take 1 tablet (40 mg total) by mouth daily. 09/21/22   Arnoldo Lenis, MD  meloxicam (MOBIC) 15 MG tablet Take 1 tablet (15 mg total) by mouth daily. 09/15/22   Lindell Spar, MD  nystatin-triamcinolone ointment [facility]) Apply 1 Application topically 2 (two) times daily. 07/03/22   McKenzie, Candee Furbish, MD  RESTASIS 0.05 % ophthalmic emulsion Place 1 drop into both eyes 2 (two) times daily as needed. 10/02/21   [provider]  SURE COMFORT PEN NEEDLES 32G X 4 MM MISC USE ONCE DAILY 11/04/21   Cassandria Anger, MD  traZODone (DESYREL) 50 MG tablet Take 1.5 tablets (75 mg total) by mouth daily with breakfast. 07/24/22   Posey Pronto, Gregor Hams  K, MD      Allergies    Patient has no known allergies.    Review of Systems   Review of Systems  Musculoskeletal:  Positive for arthralgias and myalgias.  All other systems reviewed and are negative.   Physical Exam Updated Vital Signs BP (!) 146/76 (BP Location: Right Arm)   Pulse 93   Temp 98.2 F (36.8 C) (Oral)   Resp 16   Ht 5\' 7"  (1.702 m)   Wt 59.4 kg   SpO2 99%   BMI 20.52 kg/m  Physical Exam Vitals and nursing note reviewed.  Constitutional:      General: He is not in acute distress.    Appearance: Normal appearance. He is normal weight. He is not ill-appearing.  HENT:     Head: Normocephalic and atraumatic.   Cardiovascular:     Pulses:          Dorsalis pedis pulses are 2+ on the right side and 2+ on the left side.  Pulmonary:     Effort: Pulmonary effort is normal. No respiratory distress.  Abdominal:     General: Abdomen is flat.  Musculoskeletal:        General: Normal range of motion.     Cervical back: Neck supple.     Right lower leg: No edema.     Left lower leg: No edema.  Skin:    General: Skin is warm and dry.     Findings: No erythema.  Neurological:     Mental Status: He is alert and oriented to person, place, and time.     Sensory: No sensory deficit.  Psychiatric:        Mood and Affect: Mood normal.        Behavior: Behavior normal.     ED Results / Procedures / Treatments   Labs (all labs ordered are listed, but only abnormal results are displayed) Labs Reviewed - No data to display  EKG None  Radiology US Venous Img Lower Bilateral (DVT)  Result Date: 10/15/2022 CLINICAL DATA:  Acute bilateral lower extremity pain. EXAM: BILATERAL LOWER EXTREMITY VENOUS DOPPLER ULTRASOUND TECHNIQUE: Gray-scale sonography with graded compression, as well as color Doppler and duplex ultrasound were performed to evaluate the lower extremity deep venous systems from the level of the common femoral vein and including the common femoral, femoral, profunda femoral, popliteal and calf veins including the posterior tibial, peroneal and gastrocnemius veins when visible. The superficial great saphenous vein was also interrogated. Spectral Doppler was utilized to evaluate flow at rest and with distal augmentation maneuvers in the common femoral, femoral and popliteal veins. COMPARISON:  None Available. FINDINGS: RIGHT LOWER EXTREMITY Common Femoral Vein: No evidence of thrombus. Normal compressibility, respiratory phasicity and response to augmentation. Saphenofemoral Junction: No evidence of thrombus. Normal compressibility and flow on color Doppler imaging. Profunda Femoral Vein: No evidence of  thrombus. Normal compressibility and flow on color Doppler imaging. Femoral Vein: No evidence of thrombus. Normal compressibility, respiratory phasicity and response to augmentation. Popliteal Vein: No evidence of thrombus. Normal compressibility, respiratory phasicity and response to augmentation. Calf Veins: No evidence of thrombus. Normal compressibility and flow on color Doppler imaging. Superficial Great Saphenous Vein: No evidence of thrombus. Normal compressibility. Venous Reflux:  None. Other Findings:  None. LEFT LOWER EXTREMITY Common Femoral Vein: No evidence of thrombus. Normal compressibility, respiratory phasicity and response to augmentation. Saphenofemoral Junction: No evidence of thrombus. Normal compressibility and flow on color Doppler imaging. Profunda Femoral Vein: No evidence of  thrombus. Normal compressibility and flow on color Doppler imaging. Femoral Vein: No evidence of thrombus. Normal compressibility, respiratory phasicity and response to augmentation. Popliteal Vein: No evidence of thrombus. Normal compressibility, respiratory phasicity and response to augmentation. Calf Veins: No evidence of thrombus. Normal compressibility and flow on color Doppler imaging. Superficial Great Saphenous Vein: No evidence of thrombus. Normal compressibility. Venous Reflux:  None. Other Findings:  None. IMPRESSION: No evidence of deep venous thrombosis in either lower extremity. Electronically Signed   By: Marijo Conception M.D.   On: 10/15/2022 15:56    Procedures Procedures    Medications Ordered in ED Medications  acetaminophen (TYLENOL) tablet 650 mg (650 mg Oral Given 10/15/22 1512)    ED Course/ Medical Decision Making/ A&P                             Medical Decision Making Risk OTC drugs.  This patient presents to the ED for concern of bilateral lower extremity pain, this involves an extensive number of treatment options, and is a complaint that carries with it a high risk of  complications and morbidity.  The differential diagnosis includes DVT, overuse injury  Co morbidities that complicate the patient evaluation  hypertension, diabetes, hyperlipidemia, renal insufficiency, bilateral lower extremity stent placement  My initial workup includes DVT study, Tylenol for pain  Additional history obtained from: Nursing notes from this visit.  I ordered imaging studies including bilateral DVT study I independently visualized and interpreted imaging which showed negative I agree with the radiologist interpretation  Afebrile, hemodynamically stable.  72 year old male presents the ED for evaluation of bilateral lower extremity aching.  Neurovascular status is intact.  DP pulses are 2+ bilaterally.  Low suspicion for DVT as the DVT study was negative.  Likely overuse injury.  Pain did improve with Tylenol in the ED.  He was encouraged to take Tylenol throughout the day for his pain and to follow-up with his primary care provider if the pain does not improve.  He was given return precautions.  Stable at discharge.  At this time there does not appear to be any evidence of an acute emergency medical condition and the patient appears stable for discharge with appropriate outpatient follow up. Diagnosis was discussed with patient who verbalizes understanding of care plan and is agreeable to discharge. I have discussed return precautions with patient and wife who verbalizes understanding. Patient encouraged to follow-up with their PCP within 1 week. All questions answered.  Patient's case discussed with Dr. Alvino Chapel who agrees with plan to discharge with follow-up.   Note: Portions of this report may have been transcribed using voice recognition software. Every effort was made to ensure accuracy; however, inadvertent computerized transcription errors may still be present.        Final Clinical Impression(s) / ED Diagnoses Final diagnoses:  Bilateral leg pain    Rx / DC  Orders ED Discharge Orders     None         Nehemiah Massed 10/15/22 1621    Davonna Belling, MD 10/22/22 [phone]

## 2022-10-15 NOTE — ED Triage Notes (Signed)
Pt reports that both of his legs from the hips to his feet started to hurt while he was out running errands this morning and he is concerned because he has stents in his legs.  Denies any numbness, says it's just an aching pain.

## 2022-10-22 ENCOUNTER — Telehealth: Payer: Self-pay

## 2022-10-22 NOTE — Telephone Encounter (Signed)
     Patient  visit on 2/1  at [facility]    Have you been able to follow up with your primary care physician? Yes   The patient was or was not able to obtain any needed medicine or equipment. Yes   Are there diet recommendations that you are having difficulty following? Na   Patient expresses understanding of discharge instructions and education provided has no other needs at this time.  Yes      [facility], [place] [phone] [address], [place], [place] [address] Phone: [phone] Email: [email or web address]    

## 2022-10-30 ENCOUNTER — Other Ambulatory Visit: Payer: Self-pay | Admitting: "Endocrinology

## 2022-11-12 ENCOUNTER — Encounter: Payer: Self-pay | Admitting: Radiology

## 2022-11-17 ENCOUNTER — Other Ambulatory Visit: Payer: Self-pay

## 2022-11-17 DIAGNOSIS — I771 Stricture of artery: Secondary | ICD-10-CM

## 2022-11-17 DIAGNOSIS — I739 Peripheral vascular disease, unspecified: Secondary | ICD-10-CM

## 2022-11-26 ENCOUNTER — Encounter: Payer: Self-pay | Admitting: Internal Medicine

## 2022-11-26 ENCOUNTER — Ambulatory Visit (INDEPENDENT_AMBULATORY_CARE_PROVIDER_SITE_OTHER): Payer: Medicare Other | Admitting: Internal Medicine

## 2022-11-26 VITALS — BP 128/50 | HR 60 | Ht 67.0 in | Wt 130.8 lb

## 2022-11-26 DIAGNOSIS — J432 Centrilobular emphysema: Secondary | ICD-10-CM | POA: Diagnosis not present

## 2022-11-26 DIAGNOSIS — I10 Essential (primary) hypertension: Secondary | ICD-10-CM

## 2022-11-26 DIAGNOSIS — I739 Peripheral vascular disease, unspecified: Secondary | ICD-10-CM

## 2022-11-26 DIAGNOSIS — G4700 Insomnia, unspecified: Secondary | ICD-10-CM | POA: Diagnosis not present

## 2022-11-26 DIAGNOSIS — F17210 Nicotine dependence, cigarettes, uncomplicated: Secondary | ICD-10-CM

## 2022-11-26 DIAGNOSIS — E1169 Type 2 diabetes mellitus with other specified complication: Secondary | ICD-10-CM | POA: Diagnosis not present

## 2022-11-26 DIAGNOSIS — Z720 Tobacco use: Secondary | ICD-10-CM

## 2022-11-26 MED ORDER — TRAZODONE HCL 100 MG PO TABS: 100.0000 mg | ORAL_TABLET | Freq: Every day | ORAL | 1 refills | Status: DC

## 2022-11-26 MED ORDER — ALBUTEROL SULFATE HFA 108 (90 BASE) MCG/ACT IN AERS: 2.0000 | INHALATION_SPRAY | Freq: Four times a day (QID) | RESPIRATORY_TRACT | 1 refills | Status: DC | PRN

## 2022-11-26 NOTE — Assessment & Plan Note (Signed)
Takes trazodone 75 mg nightly, but still complains of insomnia and daytime sleepiness Increased dose of trazodone to 100 mg nightly Sleep hygiene material provided

## 2022-11-26 NOTE — Patient Instructions (Addendum)
Please start taking Hydrochlorothiazide only half tablet once daily.  Please start taking Trazodone 100 mg once at bedtime instead of 75 mg.  Please continue taking other medications as prescribed.  Please continue to follow low carb and low salt diet and ambulate as tolerated.

## 2022-11-26 NOTE — Assessment & Plan Note (Signed)
BP Readings from Last 1 Encounters:  11/26/22 (!) 128/50   Well-controlled with amlodipine 10 mg QD, lisinopril 40 mg QD, HCTZ 25 mg QD and clonidine 0.1 mg BID Since he has dizziness, decreased dose of HCTZ to 12.5 mg QD If persistent dizziness, may need to decrease clonidine Followed by cardiology Counseled for compliance with the medications Advised DASH diet and moderate exercise/walking, at least 150 mins/week

## 2022-11-26 NOTE — Assessment & Plan Note (Signed)
Smokes about 1 cigarettes per day, has cut down from 1.5 pack/day  Asked about quitting: confirms that he currently smokes cigarettes Advise to quit smoking: Educated about QUITTING to reduce the risk of cancer, cardio and cerebrovascular disease. Assess willingness: Unwilling to quit at this time, but is working on cutting back. Assist with counseling and pharmacotherapy: Counseled for 5 minutes and literature provided. Arrange for follow up:  Follow up in 3 months and continue to offer help.

## 2022-11-26 NOTE — Progress Notes (Signed)
Established Patient Office Visit  Subjective:  Patient ID: Chase Briggs, male    DOB: 1951/06/08  Age: 72 y.o. MRN: [id number]  CC:  Chief Complaint  Patient presents with   Hypertension    Patient states since taking the medication his heart doctor prescribed, he has moments of feeling drowsy.   Diabetes    HPI Chase Briggs is a 72 y.o. male with past medical history of HTN, PAD, allergic rhinitis, type II DM, HLD, insomnia, anemia and tobacco abuse who presents for f/u of his chronic medical conditions.  HTN: BP is well-controlled. Takes medications regularly.  His dose of lisinopril was recently increased to 40 mg QD by his cardiologist.  He complains of dizziness, especially upon standing up since increasing dose of lisinopril. He is still taking amlodipine 10 mg QD, HCTZ 25 mg QD and clonidine 0.1 mg BID. Patient denies headache, chest pain, or palpitations.  He follows up with cardiology as well.  PAD: Has had right and left iliac stent placement, followed by vascular surgery.  He takes aspirin, Plavix and statin.  Denies any recent worsening of claudication symptoms.   Type II DM with HLD: He takes Lantus 14 units in the morning, followed by Dr. Dorris Fetch.  Denies any fatigue, polyuria or polydipsia.  He takes Lipitor for HLD. Denies any fever, chills, chronic cough, hemoptysis, or night sweats.  He takes trazodone 75 mg for insomnia.  He reports difficulty maintaining sleep, and wakes up after 3 hours of sleep.  He also complains of daytime sleepiness.  Does not report snoring. Denies any anhedonia, SI or HI currently.  He smokes about 1 cigarettes/day, but used to smoke 1.5 pack/day x15 years in the past.     Past Medical History:  Diagnosis Date   Diabetes mellitus    Fatty liver    GERD (gastroesophageal reflux disease)    HTN (hypertension)    Hyperlipidemia    Insomnia    Renal insufficiency     Past Surgical History:  Procedure Laterality Date    ABDOMINAL AORTOGRAM W/LOWER EXTREMITY N/A 08/22/2018   Procedure: ABDOMINAL AORTOGRAM W/LOWER EXTREMITY;  Surgeon: Waynetta Sandy, MD;  Location: [facility];  Service: Cardiovascular;  Laterality: N/A;   BACK SURGERY     CARPAL TUNNEL WITH CUBITAL TUNNEL Right 03/21/2019   Procedure: RIGHT CARPAL TUNNEL AND CUBITAL  TUNNEL RELEASES;  Surgeon: Daryll Brod, MD;  Location: [facility];  Service: Orthopedics;  Laterality: Right;  AXILLARY BLOCK   COLONOSCOPY  01/2007   Dr. Arnoldo Morale, reviewed report, mild diverticulosis. Prep adequate. Next TCS due 01/2017   ESOPHAGOGASTRODUODENOSCOPY N/A 10/21/2012   [id number] GASTRITIS   MASS EXCISION Right 10/26/2012   Procedure: EXCISION MASS;  Surgeon: Jamesetta So, MD;  Location: [facility];  Service: General;  Laterality: Right;   PERIPHERAL VASCULAR INTERVENTION  08/22/2018   Procedure: PERIPHERAL VASCULAR INTERVENTION;  Surgeon: Waynetta Sandy, MD;  Location: [facility];  Service: Cardiovascular;;  bilateral external iliac stents   scalp mass  4-5 yrs ago   Dr Caralyn Guile NERVE TRANSPOSITION Right 03/21/2019   Procedure: DECOMPRESSION RIGHT ULNAR NERVE;  Surgeon: Daryll Brod, MD;  Location: [facility];  Service: Orthopedics;  Laterality: Right;    Family History  Problem Relation Age of Onset   Diabetes Mother    Diabetes Father    Diabetes Brother    Colon cancer Neg Hx    Liver disease Neg  Hx     Social History   Socioeconomic History   Marital status: Married    Spouse name: Not on file   Number of children: 2   Years of education: Not on file   Highest education level: Not on file  Occupational History   Occupation: reitred    Comment: [employer], heavy lifting  Tobacco Use   Smoking status: Every Day    Packs/day: 1.00    Years: 35.00    Additional pack years: 0.00    Total pack years: 35.00    Types: Cigarettes   Smokeless tobacco: Never  Vaping Use   Vaping Use: Never  used  Substance and Sexual Activity   Alcohol use: No    Comment: hx heavy etoh x 3 yrs, quit 30+ yrs ago   Drug use: No   Sexual activity: Yes    Birth control/protection: None  Other Topics Concern   Not on file  Social History Narrative   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 Married for 12 years. Has 2 sons (23 and 42). Lives with wife.   Social Determinants of Health   Financial Resource Strain: Not on file  Food Insecurity: Not on file  Transportation Needs: Not on file  Physical Activity: Not on file  Stress: Not on file  Social Connections: Not on file  Intimate Partner Violence: Not on file    Outpatient Medications Prior to Visit  Medication Sig Dispense Refill   amLODipine (NORVASC) 10 MG tablet Take 1 tablet (10 mg total) by mouth daily. 90 tablet 3   aspirin EC 81 MG tablet Take 1 tablet (81 mg total) by mouth daily with breakfast. 30 tablet 11   atorvastatin (LIPITOR) 10 MG tablet Take 1 tablet (10 mg total) by mouth daily. 90 tablet 3   Cholecalciferol (VITAMIN D) 125 MCG (5000 UT) CAPS Take 2,000 Units by mouth daily. 30 capsule 3   cloNIDine (CATAPRES) 0.1 MG tablet Take 1 tablet (0.1 mg total) by mouth 2 (two) times daily. 180 tablet 3   clopidogrel (PLAVIX) 75 MG tablet TAKE (1) TABLET BY MOUTH ONCE DAILY. 90 tablet 3   cyclobenzaprine (FLEXERIL) 10 MG tablet Take 1 tablet (10 mg total) by mouth 2 (two) times daily as needed for muscle spasms. 30 tablet 0   Ferrous Sulfate (IRON) 325 (65 Fe) MG TABS Take 325 mg by mouth daily.     glucose blood test strip 1 each by Other route 2 (two) times daily. Use as instructed bid E11.65 Relion Premier 100 each 5   hydrochlorothiazide (HYDRODIURIL) 25 MG tablet Take 1 tablet (25 mg total) by mouth daily. (Patient taking differently: Take 12.5 mg by mouth daily.) 30 tablet 6   Lancets MISC 1 each by Does not apply route 2 (two) times daily. E11.65 Relion Premier 100 each 5   LANTUS  SOLOSTAR 100 UNIT/ML Solostar Pen INJECT 14 UNITS UNDER THE SKIN AT BEDTIME (Patient taking differently: 14 Units daily.) 15 mL 2   lisinopril (ZESTRIL) 40 MG tablet Take 1 tablet (40 mg total) by mouth daily. 30 tablet 6   nystatin-triamcinolone ointment (MYCOLOG) Apply 1 Application topically 2 (two) times daily. 30 g 5   RESTASIS 0.05 % ophthalmic emulsion Place 1 drop into both eyes 2 (two) times daily as needed.     SURE COMFORT PEN NEEDLES 32G X 4 MM MISC USE ONCE DAILY 100 each 3   meloxicam (MOBIC) 15 MG tablet Take  1 tablet (15 mg total) by mouth daily. 30 tablet 0   traZODone (DESYREL) 50 MG tablet Take 1.5 tablets (75 mg total) by mouth daily with breakfast. 135 tablet 1   No facility-administered medications prior to visit.    No Known Allergies  ROS Review of Systems  Constitutional:  Negative for chills and fever.  HENT:  Negative for congestion, postnasal drip and sore throat.   Respiratory:  Negative for cough and shortness of breath.   Cardiovascular:  Negative for chest pain and palpitations.  Gastrointestinal:  Positive for constipation. Negative for diarrhea and vomiting.  Genitourinary:  Negative for dysuria and hematuria.  Musculoskeletal:  Negative for neck pain and neck stiffness.  Skin:  Negative for rash.  Neurological:  Negative for dizziness and weakness.  Psychiatric/Behavioral:  Positive for sleep disturbance. Negative for agitation and behavioral problems.       Objective:    Physical Exam Vitals reviewed.  Constitutional:      General: He is not in acute distress.    Appearance: He is not diaphoretic.  HENT:     Head: Normocephalic and atraumatic.     Nose: No congestion.     Mouth/Throat:     Mouth: Mucous membranes are moist.  Eyes:     General: No scleral icterus.    Extraocular Movements: Extraocular movements intact.  Cardiovascular:     Rate and Rhythm: Normal rate and regular rhythm.     Pulses: Normal pulses.     Heart sounds:  Normal heart sounds. No murmur heard. Pulmonary:     Breath sounds: Normal breath sounds. No wheezing or rales.  Musculoskeletal:     Cervical back: Neck supple. No tenderness.     Right lower leg: No edema.     Left lower leg: No edema.  Skin:    General: Skin is warm.     Findings: No rash.     Comments: Clubbing of fingernails  Neurological:     General: No focal deficit present.     Mental Status: He is alert and oriented to person, place, and time.  Psychiatric:        Mood and Affect: Mood normal.        Behavior: Behavior normal.     BP (!) 128/50 (BP Location: Left Arm)   Pulse 60   Ht '5\' 7"'$  (1.702 m)   Wt 130 lb 12.8 oz (59.3 kg)   SpO2 91%   BMI 20.49 kg/m  Wt Readings from Last 3 Encounters:  11/26/22 130 lb 12.8 oz (59.3 kg)  10/15/22 131 lb (59.4 kg)  09/21/22 130 lb 3.2 oz (59.1 kg)    No results found for: "TSH" Lab Results  Component Value Date   WBC 6.6 04/22/2022   HGB 15.0 04/22/2022   HCT 43.6 04/22/2022   MCV 95.2 04/22/2022   PLT 178 04/22/2022   Lab Results  Component Value Date   NA 140 08/18/2022   K 4.0 08/18/2022   CO2 23 08/18/2022   GLUCOSE 87 08/18/2022   BUN 10 08/18/2022   CREATININE 1.30 (H) 08/18/2022   BILITOT 0.5 08/18/2022   ALKPHOS 104 08/18/2022   AST 13 08/18/2022   ALT 6 08/18/2022   PROT 8.1 08/18/2022   ALBUMIN 4.5 08/18/2022   CALCIUM 9.8 08/18/2022   ANIONGAP 12 04/22/2022   EGFR 59 (L) 08/18/2022   Lab Results  Component Value Date   CHOL 108 08/18/2022   Lab Results  Component Value Date  HDL 48 08/18/2022   Lab Results  Component Value Date   LDLCALC 49 08/18/2022   Lab Results  Component Value Date   TRIG 45 08/18/2022   Lab Results  Component Value Date   CHOLHDL 2.3 08/18/2022   Lab Results  Component Value Date   HGBA1C 6.1 08/26/2022      Assessment & Plan:   Problem List Items Addressed This Visit       Cardiovascular and Mediastinum   Essential hypertension - Primary     BP Readings from Last 1 Encounters:  11/26/22 (!) 128/50  Well-controlled with amlodipine 10 mg QD, lisinopril 40 mg QD, HCTZ 25 mg QD and clonidine 0.1 mg BID Since he has dizziness, decreased dose of HCTZ to 12.5 mg QD If persistent dizziness, may need to decrease clonidine Followed by cardiology Counseled for compliance with the medications Advised DASH diet and moderate exercise/walking, at least 150 mins/week      Relevant Orders   Basic Metabolic Panel (BMET)   Urine Microalbumin w/creat. ratio   PAD (peripheral artery disease) (HCC)    S/p right and left iliac stent placement Followed by vascular surgery and cardiology On aspirin, Plavix and statin        Respiratory   Centrilobular emphysema (HCC)    Has mild dyspnea Started Albuterol PRN for dyspnea or wheezing      Relevant Medications   albuterol (VENTOLIN HFA) 108 (90 Base) MCG/ACT inhaler     Endocrine   Type 2 diabetes mellitus with other specified complication (HCC)    Lab Results  Component Value Date   HGBA1C 6.1 08/26/2022  Associated with HTN and PAD Well-controlled with Lantus 14 units every morning, followed by Dr. Dorris Fetch Advised to follow diabetic diet On statin and ACEi F/u CMP and lipid panel Diabetic eye exam: Advised to follow up with Ophthalmology for diabetic eye exam        Other   Insomnia    Takes trazodone 75 mg nightly, but still complains of insomnia and daytime sleepiness Increased dose of trazodone to 100 mg nightly Sleep hygiene material provided      Relevant Medications   traZODone (DESYREL) 100 MG tablet   Tobacco abuse    Smokes about 1 cigarettes per day, has cut down from 1.5 pack/day  Asked about quitting: confirms that he currently smokes cigarettes Advise to quit smoking: Educated about QUITTING to reduce the risk of cancer, cardio and cerebrovascular disease. Assess willingness: Unwilling to quit at this time, but is working on cutting back. Assist with counseling  and pharmacotherapy: Counseled for 5 minutes and literature provided. Arrange for follow up:  Follow up in 3 months and continue to offer help.       Meds ordered this encounter  Medications   traZODone (DESYREL) 100 MG tablet    Sig: Take 1 tablet (100 mg total) by mouth daily with breakfast.    Dispense:  90 tablet    Refill:  1    Dose change   albuterol (VENTOLIN HFA) 108 (90 Base) MCG/ACT inhaler    Sig: Inhale 2 puffs into the lungs every 6 (six) hours as needed for wheezing or shortness of breath.    Dispense:  20.1 each    Refill:  1    Okay to substitute to generic/formulary Albuterol.    Follow-up: Return in about 3 months (around 02/26/2023) for HTN and DM.    Lindell Spar, MD

## 2022-11-26 NOTE — Assessment & Plan Note (Addendum)
Lab Results  Component Value Date   HGBA1C 6.1 08/26/2022   Associated with HTN and PAD Well-controlled with Lantus 14 units every morning, followed by Dr. Sallyanne Havers to follow diabetic diet On statin and ACEi F/u CMP and lipid panel Diabetic eye exam: Advised to follow up with Ophthalmology for diabetic eye exam

## 2022-11-26 NOTE — Assessment & Plan Note (Signed)
S/p right and left iliac stent placement Followed by vascular surgery and cardiology On aspirin, Plavix and statin

## 2022-11-26 NOTE — Assessment & Plan Note (Signed)
Has mild dyspnea Started Albuterol PRN for dyspnea or wheezing

## 2022-11-29 LAB — BASIC METABOLIC PANEL
BUN/Creatinine Ratio: 7 — ABNORMAL LOW (ref 10–24)
BUN: 10 mg/dL (ref 8–27)
CO2: 24 mmol/L (ref 20–29)
Calcium: 9.8 mg/dL (ref 8.6–10.2)
Chloride: 98 mmol/L (ref 96–106)
Creatinine, Ser: 1.45 mg/dL — ABNORMAL HIGH (ref 0.76–1.27)
Glucose: 150 mg/dL — ABNORMAL HIGH (ref 70–99)
Potassium: 4.6 mmol/L (ref 3.5–5.2)
Sodium: 139 mmol/L (ref 134–144)
eGFR: 52 mL/min/{1.73_m2} — ABNORMAL LOW (ref >59)

## 2022-11-29 LAB — MICROALBUMIN / CREATININE URINE RATIO
Creatinine, Urine: 125.3 mg/dL
Microalb/Creat Ratio: 86 mg/g creat — ABNORMAL HIGH (ref 0–29)
Microalbumin, Urine: 107.6 ug/mL

## 2022-12-08 ENCOUNTER — Other Ambulatory Visit: Payer: Self-pay | Admitting: "Endocrinology

## 2022-12-08 NOTE — Progress Notes (Unsigned)
HISTORY AND PHYSICAL     CC:  follow up. Requesting Provider:  Lindell Spar, MD  HPI: This is a 72 y.o. male who is here today for follow up for PAD.  Pt has hx of aortogram with stenting of the right and left EIA on 08/22/2018 by Dr. Donzetta Matters for BLE claudication.    Pt was last seen 10/13/2021 and at that time, he was not having any claudication, rest pain or non healing wounds. He was still smoking.    The pt returns today for follow up.  ***  The pt is on a statin for cholesterol management.    The pt is on an aspirin.    Other AC:  Plavix The pt is on CCB, clonidine, ACEI, diuretic for hypertension.  The pt does  have diabetes. Tobacco hx:  ***  Pt does *** have family hx of AAA.  Past Medical History:  Diagnosis Date   Diabetes mellitus    Fatty liver    GERD (gastroesophageal reflux disease)    HTN (hypertension)    Hyperlipidemia    Insomnia    Renal insufficiency     Past Surgical History:  Procedure Laterality Date   ABDOMINAL AORTOGRAM W/LOWER EXTREMITY N/A 08/22/2018   Procedure: ABDOMINAL AORTOGRAM W/LOWER EXTREMITY;  Surgeon: Waynetta Sandy, MD;  Location: [facility];  Service: Cardiovascular;  Laterality: N/A;   BACK SURGERY     CARPAL TUNNEL WITH CUBITAL TUNNEL Right 03/21/2019   Procedure: RIGHT CARPAL TUNNEL AND CUBITAL  TUNNEL RELEASES;  Surgeon: Daryll Brod, MD;  Location: [facility];  Service: Orthopedics;  Laterality: Right;  AXILLARY BLOCK   COLONOSCOPY  01/2007   Dr. Arnoldo Morale, reviewed report, mild diverticulosis. Prep adequate. Next TCS due 01/2017   ESOPHAGOGASTRODUODENOSCOPY N/A 10/21/2012   [id number] GASTRITIS   MASS EXCISION Right 10/26/2012   Procedure: EXCISION MASS;  Surgeon: Jamesetta So, MD;  Location: [facility];  Service: General;  Laterality: Right;   PERIPHERAL VASCULAR INTERVENTION  08/22/2018   Procedure: PERIPHERAL VASCULAR INTERVENTION;  Surgeon: Waynetta Sandy, MD;  Location: [facility];  Service: Cardiovascular;;  bilateral external iliac stents   scalp mass  4-5 yrs ago   Dr Caralyn Guile NERVE TRANSPOSITION Right 03/21/2019   Procedure: DECOMPRESSION RIGHT ULNAR NERVE;  Surgeon: Daryll Brod, MD;  Location: [place] [facility];  Service: Orthopedics;  Laterality: Right;    No Known Allergies  Current Outpatient Medications  Medication Sig Dispense Refill   albuterol (VENTOLIN HFA) 108 (90 Base) MCG/ACT inhaler Inhale 2 puffs into the lungs every 6 (six) hours as needed for wheezing or shortness of breath. 20.1 each 1   amLODipine (NORVASC) 10 MG tablet Take 1 tablet (10 mg total) by mouth daily. 90 tablet 3   aspirin EC 81 MG tablet Take 1 tablet (81 mg total) by mouth daily with breakfast. 30 tablet 11   atorvastatin (LIPITOR) 10 MG tablet Take 1 tablet (10 mg total) by mouth daily. 90 tablet 3   Cholecalciferol (VITAMIN D) 125 MCG (5000 UT) CAPS Take 2,000 Units by mouth daily. 30 capsule 3   cloNIDine (CATAPRES) 0.1 MG tablet Take 1 tablet (0.1 mg total) by mouth 2 (two) times daily. 180 tablet 3   clopidogrel (PLAVIX) 75 MG tablet TAKE (1) TABLET BY MOUTH ONCE DAILY. 90 tablet 3   cyclobenzaprine (FLEXERIL) 10 MG tablet Take 1 tablet (10 mg total) by mouth 2 (two) times daily as needed  for muscle spasms. 30 tablet 0   Ferrous Sulfate (IRON) 325 (65 Fe) MG TABS Take 325 mg by mouth daily.     glucose blood test strip 1 each by Other route 2 (two) times daily. Use as instructed bid E11.65 Relion Premier 100 each 5   hydrochlorothiazide (HYDRODIURIL) 25 MG tablet Take 1 tablet (25 mg total) by mouth daily. (Patient taking differently: Take 12.5 mg by mouth daily.) 30 tablet 6   insulin glargine (LANTUS SOLOSTAR) 100 UNIT/ML Solostar Pen INJECT 14 UNITS UNDER THE SKIN AT BEDTIME 15 mL 1   Lancets MISC 1 each by Does not apply route 2 (two) times daily. E11.65 Relion Premier 100 each 5   lisinopril (ZESTRIL) 40 MG tablet Take 1 tablet (40 mg total) by mouth daily.  30 tablet 6   nystatin-triamcinolone ointment (MYCOLOG) Apply 1 Application topically 2 (two) times daily. 30 g 5   RESTASIS 0.05 % ophthalmic emulsion Place 1 drop into both eyes 2 (two) times daily as needed.     SURE COMFORT PEN NEEDLES 32G X 4 MM MISC USE ONCE DAILY 100 each 3   traZODone (DESYREL) 100 MG tablet Take 1 tablet (100 mg total) by mouth daily with breakfast. 90 tablet 1   No current facility-administered medications for this visit.    Family History  Problem Relation Age of Onset   Diabetes Mother    Diabetes Father    Diabetes Brother    Colon cancer Neg Hx    Liver disease Neg Hx     Social History   Socioeconomic History   Marital status: Married    Spouse name: Not on file   Number of children: 2   Years of education: Not on file   Highest education level: Not on file  Occupational History   Occupation: reitred    Comment: [employer], heavy lifting  Tobacco Use   Smoking status: Every Day    Packs/day: 1.00    Years: 35.00    Additional pack years: 0.00    Total pack years: 35.00    Types: Cigarettes   Smokeless tobacco: Never  Vaping Use   Vaping Use: Never used  Substance and Sexual Activity   Alcohol use: No    Comment: hx heavy etoh x 3 yrs, quit 30+ yrs ago   Drug use: No   Sexual activity: Yes    Birth control/protection: None  Other Topics Concern   Not on file  Social History Narrative   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 Married for 12 years. Has 2 sons (67 and 39). Lives with wife.   Social Determinants of Health   Financial Resource Strain: Not on file  Food Insecurity: Not on file  Transportation Needs: Not on file  Physical Activity: Not on file  Stress: Not on file  Social Connections: Not on file  Intimate Partner Violence: Not on file     REVIEW OF SYSTEMS:  *** [X]  denotes positive finding, [ ]  denotes negative finding Cardiac  Comments:  Chest pain or chest pressure:     Shortness of breath upon exertion:    Short of breath when lying flat:    Irregular heart rhythm:        Vascular    Pain in calf, thigh, or hip brought on by ambulation:    Pain in feet at night that wakes you up from your sleep:     Blood clot in your veins:  Leg swelling:         Pulmonary    Oxygen at home:    Productive cough:     Wheezing:         Neurologic    Sudden weakness in arms or legs:     Sudden numbness in arms or legs:     Sudden onset of difficulty speaking or slurred speech:    Temporary loss of vision in one eye:     Problems with dizziness:         Gastrointestinal    Blood in stool:     Vomited blood:         Genitourinary    Burning when urinating:     Blood in urine:        Psychiatric    Major depression:         Hematologic    Bleeding problems:    Problems with blood clotting too easily:        Skin    Rashes or ulcers:        Constitutional    Fever or chills:      PHYSICAL EXAMINATION:  ***  General:  WDWN in NAD; vital signs documented above Gait: Not observed HENT: WNL, normocephalic Pulmonary: normal non-labored breathing , without wheezing Cardiac: {Desc; regular/irreg:14544} HR, {With/Without:20273} carotid bruit*** Abdomen: soft, NT; aortic pulse is *** palpable Skin: {With/Without:20273} rashes Vascular Exam/Pulses:  Right Left  Radial {Exam; arterial pulse strength 0-4:30167} {Exam; arterial pulse strength 0-4:30167}  Femoral {Exam; arterial pulse strength 0-4:30167} {Exam; arterial pulse strength 0-4:30167}  Popliteal {Exam; arterial pulse strength 0-4:30167} {Exam; arterial pulse strength 0-4:30167}  DP {Exam; arterial pulse strength 0-4:30167} {Exam; arterial pulse strength 0-4:30167}  PT {Exam; arterial pulse strength 0-4:30167} {Exam; arterial pulse strength 0-4:30167}  Peroneal *** ***   Extremities: {With/Without:20273} ischemic changes, {With/Without:20273} Gangrene , {With/Without:20273} cellulitis;  {With/Without:20273} open wounds Musculoskeletal: no muscle wasting or atrophy  Neurologic: A&O X 3 Psychiatric:  The pt has {Desc; normal/abnormal:11317::"Normal"} affect.   Non-Invasive Vascular Imaging:   ABI's/TBI's on 12/10/2022: Right:  *** - Great toe pressure: *** Left:  *** - Great toe pressure: ***  Arterial duplex on 12/10/2022: ***  Previous ABI's/TBI's on 10/13/2021: Right:  0.73/0.47 - Great toe pressure: 74 Left:  0.96/0.62 - Great toe pressure:  98  Previous arterial duplex on 10/13/2021: Abdominal Aorta Findings:  +-----------+-------+----------+----------+--------+--------+--------+  Location  AP (cm)Trans (cm)PSV (cm/s)WaveformThrombusComments  +-----------+-------+----------+----------+--------+--------+--------+  Supraceliac1.78  1.64      97                                  +-----------+-------+----------+----------+--------+--------+--------+  Proximal  1.54   1.88      59                                  +-----------+-------+----------+----------+--------+--------+--------+  RT CIA Prox0.9    1.2       99                                  +-----------+-------+----------+----------+--------+--------+--------+  LT CIA Prox0.7    0.7       103                                 +-----------+-------+----------+----------+--------+--------+--------+  Right Stent(s):  +---------------------+--------+--------+--------+--------+  External iliac arteryPSV cm/sStenosisWaveformComments  +---------------------+--------+--------+--------+--------+  Prox to Stent        170             biphasic          +---------------------+--------+--------+--------+--------+  Proximal Stent       147             biphasic          +---------------------+--------+--------+--------+--------+  Mid Stent            199             biphasic          +---------------------+--------+--------+--------+--------+  Distal Stent          97              biphasic          +---------------------+--------+--------+--------+--------+  Distal to Stent      100             biphasic          +---------------------+--------+--------+--------+--------+   Left Stent(s):  +---------------------+--------+--------+--------+--------+  External iliac arteryPSV cm/sStenosisWaveformComments  +---------------------+--------+--------+--------+--------+  Prox to Stent        165             biphasic          +---------------------+--------+--------+--------+--------+  Proximal Stent       124             biphasic          +---------------------+--------+--------+--------+--------+  Mid Stent            149             biphasic          +---------------------+--------+--------+--------+--------+  Distal Stent         127             biphasic          +---------------------+--------+--------+--------+--------+  Distal to Stent      129             biphasic          +---------------------+--------+--------+--------+--------+   Summary:  Stenosis: +--------------------+-----------+  Location            Stent        +--------------------+-----------+  Right External Iliacno stenosis  +--------------------+-----------+  Left External Iliac no stenosis  +--------------------+-----------+     ASSESSMENT/PLAN:: 72 y.o. male here for follow up for PAD with hx of aortogram with stenting of the right and left EIA on 08/22/2018 by Dr. Donzetta Matters for BLE claudication.    -*** -continue asa/plavix/statin -pt will f/u in *** with ***.   Leontine Locket, [facility] [phone]  Clinic MD:   Scot Dock

## 2022-12-10 ENCOUNTER — Encounter: Payer: Self-pay | Admitting: Physician Assistant

## 2022-12-10 ENCOUNTER — Ambulatory Visit (INDEPENDENT_AMBULATORY_CARE_PROVIDER_SITE_OTHER): Payer: Medicare Other | Admitting: Physician Assistant

## 2022-12-10 ENCOUNTER — Ambulatory Visit (HOSPITAL_COMMUNITY)
Admission: RE | Admit: 2022-12-10 | Discharge: 2022-12-10 | Disposition: A | Discharge status: Home / Self Care | Payer: Medicare Other | Source: Ambulatory Visit | Attending: Vascular Surgery | Admitting: Vascular Surgery

## 2022-12-10 ENCOUNTER — Ambulatory Visit (INDEPENDENT_AMBULATORY_CARE_PROVIDER_SITE_OTHER)
Admission: RE | Admit: 2022-12-10 | Discharge: 2022-12-10 | Disposition: A | Discharge status: Home / Self Care | Payer: Medicare Other | Source: Ambulatory Visit | Attending: Vascular Surgery | Admitting: Vascular Surgery

## 2022-12-10 VITALS — BP 146/77 | HR 58 | Temp 97.7°F

## 2022-12-10 DIAGNOSIS — I739 Peripheral vascular disease, unspecified: Secondary | ICD-10-CM | POA: Diagnosis not present

## 2022-12-10 DIAGNOSIS — I771 Stricture of artery: Secondary | ICD-10-CM | POA: Insufficient documentation

## 2022-12-10 LAB — VAS US ABI WITH/WO TBI
Left ABI: 0.99
Right ABI: 0.77

## 2022-12-21 DIAGNOSIS — I739 Peripheral vascular disease, unspecified: Secondary | ICD-10-CM | POA: Diagnosis not present

## 2022-12-21 DIAGNOSIS — B351 Tinea unguium: Secondary | ICD-10-CM | POA: Diagnosis not present

## 2022-12-21 DIAGNOSIS — L851 Acquired keratosis [keratoderma] palmaris et plantaris: Secondary | ICD-10-CM | POA: Diagnosis not present

## 2022-12-21 DIAGNOSIS — M79674 Pain in right toe(s): Secondary | ICD-10-CM | POA: Diagnosis not present

## 2022-12-21 DIAGNOSIS — E1151 Type 2 diabetes mellitus with diabetic peripheral angiopathy without gangrene: Secondary | ICD-10-CM | POA: Diagnosis not present

## 2022-12-25 ENCOUNTER — Emergency Department (HOSPITAL_COMMUNITY)
Admission: EM | Admit: 2022-12-25 | Discharge: 2022-12-25 | Disposition: A | Discharge status: Home / Self Care | Payer: Medicare Other | Attending: Emergency Medicine | Admitting: Emergency Medicine

## 2022-12-25 ENCOUNTER — Emergency Department (HOSPITAL_COMMUNITY): Payer: Medicare Other

## 2022-12-25 ENCOUNTER — Encounter (HOSPITAL_COMMUNITY): Payer: Self-pay | Admitting: *Deleted

## 2022-12-25 ENCOUNTER — Other Ambulatory Visit: Payer: Self-pay

## 2022-12-25 DIAGNOSIS — N189 Chronic kidney disease, unspecified: Secondary | ICD-10-CM | POA: Insufficient documentation

## 2022-12-25 DIAGNOSIS — E860 Dehydration: Secondary | ICD-10-CM | POA: Diagnosis not present

## 2022-12-25 DIAGNOSIS — E1122 Type 2 diabetes mellitus with diabetic chronic kidney disease: Secondary | ICD-10-CM | POA: Insufficient documentation

## 2022-12-25 DIAGNOSIS — Z794 Long term (current) use of insulin: Secondary | ICD-10-CM | POA: Insufficient documentation

## 2022-12-25 DIAGNOSIS — Z7982 Long term (current) use of aspirin: Secondary | ICD-10-CM | POA: Insufficient documentation

## 2022-12-25 DIAGNOSIS — Z7902 Long term (current) use of antithrombotics/antiplatelets: Secondary | ICD-10-CM | POA: Diagnosis not present

## 2022-12-25 DIAGNOSIS — U071 COVID-19: Secondary | ICD-10-CM

## 2022-12-25 DIAGNOSIS — R059 Cough, unspecified: Secondary | ICD-10-CM | POA: Diagnosis not present

## 2022-12-25 DIAGNOSIS — Z79899 Other long term (current) drug therapy: Secondary | ICD-10-CM | POA: Diagnosis not present

## 2022-12-25 DIAGNOSIS — R531 Weakness: Secondary | ICD-10-CM | POA: Diagnosis present

## 2022-12-25 DIAGNOSIS — I129 Hypertensive chronic kidney disease with stage 1 through stage 4 chronic kidney disease, or unspecified chronic kidney disease: Secondary | ICD-10-CM | POA: Insufficient documentation

## 2022-12-25 LAB — CBC WITH DIFFERENTIAL/PLATELET
Abs Immature Granulocytes: 0.01 10*3/uL (ref 0.00–0.07)
Basophils Absolute: 0 10*3/uL (ref 0.0–0.1)
Basophils Relative: 1 %
Eosinophils Absolute: 0 10*3/uL (ref 0.0–0.5)
Eosinophils Relative: 0 %
HCT: 41.8 % (ref 39.0–52.0)
Hemoglobin: 14 g/dL (ref 13.0–17.0)
Immature Granulocytes: 0 %
Lymphocytes Relative: 24 %
Lymphs Abs: 1.2 10*3/uL (ref 0.7–4.0)
MCH: 32 pg (ref 26.0–34.0)
MCHC: 33.5 g/dL (ref 30.0–36.0)
MCV: 95.7 fL (ref 80.0–100.0)
Monocytes Absolute: 0.8 10*3/uL (ref 0.1–1.0)
Monocytes Relative: 16 %
Neutro Abs: 2.9 10*3/uL (ref 1.7–7.7)
Neutrophils Relative %: 59 %
Platelets: 146 10*3/uL — ABNORMAL LOW (ref 150–400)
RBC: 4.37 MIL/uL (ref 4.22–5.81)
RDW: 13.2 % (ref 11.5–15.5)
WBC: 4.9 10*3/uL (ref 4.0–10.5)
nRBC: 0 % (ref 0.0–0.2)

## 2022-12-25 LAB — MAGNESIUM: Magnesium: 1.8 mg/dL (ref 1.7–2.4)

## 2022-12-25 LAB — COMPREHENSIVE METABOLIC PANEL
ALT: 14 U/L (ref 0–44)
AST: 25 U/L (ref 15–41)
Albumin: 3.9 g/dL (ref 3.5–5.0)
Alkaline Phosphatase: 61 U/L (ref 38–126)
Anion gap: 13 (ref 5–15)
BUN: 20 mg/dL (ref 8–23)
CO2: 23 mmol/L (ref 22–32)
Calcium: 8.8 mg/dL — ABNORMAL LOW (ref 8.9–10.3)
Chloride: 95 mmol/L — ABNORMAL LOW (ref 98–111)
Creatinine, Ser: 1.75 mg/dL — ABNORMAL HIGH (ref 0.61–1.24)
GFR, Estimated: 41 mL/min — ABNORMAL LOW (ref >60)
Glucose, Bld: 153 mg/dL — ABNORMAL HIGH (ref 70–99)
Potassium: 3.7 mmol/L (ref 3.5–5.1)
Sodium: 131 mmol/L — ABNORMAL LOW (ref 135–145)
Total Bilirubin: 0.9 mg/dL (ref 0.3–1.2)
Total Protein: 8.2 g/dL — ABNORMAL HIGH (ref 6.5–8.1)

## 2022-12-25 LAB — SARS CORONAVIRUS 2 BY RT PCR: SARS Coronavirus 2 by RT PCR: POSITIVE — AB

## 2022-12-25 LAB — LIPASE, BLOOD: Lipase: 22 U/L (ref 11–51)

## 2022-12-25 MED ORDER — SODIUM CHLORIDE 0.9 % IV BOLUS
500.0000 mL | Freq: Once | INTRAVENOUS | Status: AC
  Administered 2022-12-25: 500 mL via INTRAVENOUS

## 2022-12-25 MED ORDER — MOLNUPIRAVIR EUA 200MG CAPSULE: 4.0000 | ORAL_CAPSULE | Freq: Two times a day (BID) | ORAL | 0 refills | Status: AC

## 2022-12-25 MED ORDER — MOLNUPIRAVIR EUA 200MG CAPSULE: 4.0000 | ORAL_CAPSULE | Freq: Two times a day (BID) | ORAL | 0 refills | Status: DC

## 2022-12-25 NOTE — ED Provider Notes (Signed)
[place] EMERGENCY DEPARTMENT AT [facility] Provider Note   CSN: [id number] Arrival date & time: 12/25/22  0805     History  Chief Complaint  Patient presents with   Weakness    Chase Briggs is a 72 y.o. male.  Pt is a 72 yo male with pmhx significant for PVD, HTN, DM, HLD, GERD, and CKD.  Pt developed diarrhea on Tuesday, 4/9.  That is gone now.  Pt has been fatigued and weak since then.  He's not had a good appetite and has developed a cough.  No fever. Pt said his diarrhea was green.  He is concerned about that.  Pt denies any pain.          Home Medications Prior to Admission medications   Medication Sig Start Date End Date Taking? Authorizing Provider  albuterol (VENTOLIN HFA) 108 (90 Base) MCG/ACT inhaler Inhale 2 puffs into the lungs every 6 (six) hours as needed for wheezing or shortness of breath. 11/26/22  Yes Anabel Halon, MD  amLODipine (NORVASC) 10 MG tablet Take 1 tablet (10 mg total) by mouth daily. 04/23/22  Yes Shon Hale, MD  aspirin EC 81 MG tablet Take 1 tablet (81 mg total) by mouth daily with breakfast. 04/23/22  Yes Emokpae, Courage, MD  atorvastatin (LIPITOR) 10 MG tablet Take 1 tablet (10 mg total) by mouth daily. 04/23/22  Yes Emokpae, Courage, MD  Cholecalciferol (VITAMIN D) 125 MCG (5000 UT) CAPS Take 2,000 Units by mouth daily. 04/23/22  Yes Emokpae, Courage, MD  cloNIDine (CATAPRES) 0.1 MG tablet Take 1 tablet (0.1 mg total) by mouth 2 (two) times daily. 04/23/22  Yes Emokpae, Courage, MD  clopidogrel (PLAVIX) 75 MG tablet TAKE (1) TABLET BY MOUTH ONCE DAILY. 04/23/22  Yes Emokpae, Courage, MD  cyclobenzaprine (FLEXERIL) 10 MG tablet Take 1 tablet (10 mg total) by mouth 2 (two) times daily as needed for muscle spasms. 09/15/22  Yes Anabel Halon, MD  Ferrous Sulfate (IRON) 325 (65 Fe) MG TABS Take 325 mg by mouth daily.   Yes [provider]  hydrochlorothiazide (HYDRODIURIL) 25 MG tablet Take 1 tablet (25 mg total) by  mouth daily. 09/21/22  Yes Antoine Poche, MD  insulin glargine (LANTUS SOLOSTAR) 100 UNIT/ML Solostar Pen INJECT 14 UNITS UNDER THE SKIN AT BEDTIME 12/08/22  Yes Nida, Denman George, MD  lisinopril (ZESTRIL) 40 MG tablet Take 1 tablet (40 mg total) by mouth daily. 09/21/22  Yes Branch, Dorothe Pea, MD  molnupiravir EUA (LAGEVRIO) 200 mg CAPS capsule Take 4 capsules (800 mg total) by mouth 2 (two) times daily for 5 days. 12/25/22 12/30/22 Yes Jacalyn Lefevre, MD  RESTASIS 0.05 % ophthalmic emulsion Place 1 drop into both eyes 2 (two) times daily as needed. 10/02/21  Yes [provider]  traZODone (DESYREL) 100 MG tablet Take 1 tablet (100 mg total) by mouth daily with breakfast. Patient taking differently: Take 100 mg by mouth at bedtime. 11/26/22  Yes Anabel Halon, MD  glucose blood test strip 1 each by Other route 2 (two) times daily. Use as instructed bid E11.65 Relion Premier 05/15/19   Roma Kayser, MD  Lancets MISC 1 each by Does not apply route 2 (two) times daily. E11.65 Relion Premier 05/15/19   Roma Kayser, MD  nystatin-triamcinolone ointment [facility]) Apply 1 Application topically 2 (two) times daily. 07/03/22   McKenzie, Mardene Celeste, MD  SURE COMFORT PEN NEEDLES 32G X 4 MM MISC USE ONCE DAILY 10/30/22  Roma Kayser, MD      Allergies    Patient has no known allergies.    Review of Systems   Review of Systems  Gastrointestinal:  Positive for diarrhea.  Neurological:  Positive for weakness.  All other systems reviewed and are negative.   Physical Exam Updated Vital Signs BP 132/73   Pulse 70   Temp 98.5 F (36.9 C) (Oral)   Resp (!) 22   Ht  (1.702 m)   Wt 59 kg   SpO2 98%   BMI 20.36 kg/m  Physical Exam Vitals and nursing note reviewed.  Constitutional:      Appearance: Normal appearance.  HENT:     Head: Normocephalic and atraumatic.     Right Ear: External ear normal.     Left Ear: External ear normal.     Nose: Nose normal.      Mouth/Throat:     Mouth: Mucous membranes are dry.  Eyes:     Extraocular Movements: Extraocular movements intact.     Conjunctiva/sclera: Conjunctivae normal.     Pupils: Pupils are equal, round, and reactive to light.  Cardiovascular:     Rate and Rhythm: Normal rate and regular rhythm.     Pulses: Normal pulses.     Heart sounds: Normal heart sounds.  Pulmonary:     Effort: Pulmonary effort is normal.     Breath sounds: Normal breath sounds.  Abdominal:     General: Abdomen is flat. Bowel sounds are normal.     Palpations: Abdomen is soft.  Musculoskeletal:        General: Normal range of motion.     Cervical back: Normal range of motion and neck supple.  Skin:    General: Skin is warm.     Capillary Refill: Capillary refill takes less than 2 seconds.  Neurological:     General: No focal deficit present.     Mental Status: He is alert and oriented to person, place, and time.  Psychiatric:        Mood and Affect: Mood normal.        Behavior: Behavior normal.     ED Results / Procedures / Treatments   Labs (all labs ordered are listed, but only abnormal results are displayed) Labs Reviewed  SARS CORONAVIRUS 2 BY RT PCR - Abnormal; Notable for the following components:      Result Value   SARS Coronavirus 2 by RT PCR POSITIVE (*)    All other components within normal limits  CBC WITH DIFFERENTIAL/PLATELET - Abnormal; Notable for the following components:   Platelets 146 (*)    All other components within normal limits  COMPREHENSIVE METABOLIC PANEL - Abnormal; Notable for the following components:   Sodium 131 (*)    Chloride 95 (*)    Glucose, Bld 153 (*)    Creatinine, Ser 1.75 (*)    Calcium 8.8 (*)    Total Protein 8.2 (*)    GFR, Estimated 41 (*)    All other components within normal limits  LIPASE, BLOOD  MAGNESIUM  URINALYSIS, ROUTINE W REFLEX MICROSCOPIC    EKG None  Radiology DG Chest Portable 1 View  Result Date: 12/25/2022 CLINICAL DATA:   Cough. EXAM: PORTABLE CHEST 1 VIEW COMPARISON:  LDCT 09/02/2022 FINDINGS: Heart size appears normal. No pleural fluid or airspace consolidation. Chronic coarsened interstitial markings are noted bilaterally. Visualized osseous structures are unremarkable. IMPRESSION: 1. No acute cardiopulmonary abnormalities. 2. Chronic coarsened interstitial markings. Electronically Signed  By: Signa Kell M.D.   On: 12/25/2022 08:42    Procedures Procedures    Medications Ordered in ED Medications  sodium chloride 0.9 % bolus 500 mL (500 mLs Intravenous New Bag/Given 12/25/22 9518)    ED Course/ Medical Decision Making/ A&P                             Medical Decision Making Amount and/or Complexity of Data Reviewed Labs: ordered. Radiology: ordered.   This patient presents to the ED for concern of weakness, this involves an extensive number of treatment options, and is a complaint that carries with it a high risk of complications and morbidity.  The differential diagnosis includes infection, electrolyte abn   Co morbidities that complicate the patient evaluation  PVD, HTN, DM, HLD, GERD, and CKD   Additional history obtained:  Additional history obtained from epic chart review  Lab Tests:  I Ordered, and personally interpreted labs.  The pertinent results include:  cbc nl other than plt low at 146, cmp with cr elevated at 1.75 (cr 1.45 on 3/14), mg nl at 1.8, lip nl, covid is +   Imaging Studies ordered:  I ordered imaging studies including cxr  I independently visualized and interpreted imaging which showed  No acute cardiopulmonary abnormalities.  2. Chronic coarsened interstitial markings.   I agree with the radiologist interpretation   Cardiac Monitoring:  The patient was maintained on a cardiac monitor.  I personally viewed and interpreted the cardiac monitored which showed an underlying rhythm of: nsr   Medicines ordered and prescription drug management:  I ordered  medication including ivfs  for sx  Reevaluation of the patient after these medicines showed that the patient improved I have reviewed the patients home medicines and have made adjustments as needed    Problem List / ED Course:  Covid-19:  Pt is + for covid.  CXR is clear and he is oxygenating well.  He is feeling better after fluids.  He is d/c with molnupiravir as paxlovid interferes with plavix.  Pt is stable for d/c.  Return if worse.    Reevaluation:  After the interventions noted above, I reevaluated the patient and found that they have :improved   Social Determinants of Health:  Lives at home with wife   Dispostion:  After consideration of the diagnostic results and the patients response to treatment, I feel that the patent would benefit from discharge with outpatient f/u.    Chase Briggs was evaluated in Emergency Department on 12/25/2022 for the symptoms described in the history of present illness. He was evaluated in the context of the global COVID-19 pandemic, which necessitated consideration that the patient might be at risk for infection with the SARS-CoV-2 virus that causes COVID-19. Institutional protocols and algorithms that pertain to the evaluation of patients at risk for COVID-19 are in a state of rapid change based on information released by regulatory bodies including the CDC and federal and state organizations. These policies and algorithms were followed during the patient's care in the ED.        Final Clinical Impression(s) / ED Diagnoses Final diagnoses:  COVID-19  Dehydration    Rx / DC Orders ED Discharge Orders          Ordered    molnupiravir EUA (LAGEVRIO) 200 mg CAPS capsule  2 times daily        12/25/22 1007  Jacalyn Lefevre, MD 12/25/22 1009

## 2022-12-25 NOTE — ED Triage Notes (Addendum)
Pt c/o diarrhea all night that started Tuesday night, but is now gone. Pt reports ever since then he has felt very fatigued and weak. Pt also c/o cough and decreased appetite that started Tuesday. Denies fever, abdominal pain, nausea.

## 2022-12-25 NOTE — ED Notes (Signed)
ED Provider at bedside. 

## 2022-12-28 ENCOUNTER — Ambulatory Visit (INDEPENDENT_AMBULATORY_CARE_PROVIDER_SITE_OTHER): Payer: Medicare Other | Admitting: Internal Medicine

## 2022-12-28 ENCOUNTER — Encounter: Payer: Self-pay | Admitting: Internal Medicine

## 2022-12-28 DIAGNOSIS — Z0000 Encounter for general adult medical examination without abnormal findings: Secondary | ICD-10-CM

## 2022-12-28 NOTE — Progress Notes (Signed)
Subjective:  This is a telephone encounter between Chase Briggs and Chase Briggs on 12/28/2022 for AWV. The visit was conducted with the patient located at home and Chase Briggs at [facility]. The patient's identity was confirmed using their DOB and current address. The patient has consented to being evaluated through a telephone encounter and understands the associated risks (an examination cannot be done and the patient may need to come in for an appointment) / benefits (allows the patient to remain at home, decreasing exposure to coronavirus).     Chase Briggs is a 72 y.o. male who presents for Medicare Annual/Subsequent preventive examination.  Review of Systems    Review of Systems  All other systems reviewed and are negative.   Objective:    Today's Vitals   12/28/22 0854  PainSc: 0-No pain   There is no height or weight on file to calculate BMI.     12/25/2022    8:24 AM 10/15/2022    2:15 PM 04/23/2022    2:00 AM 04/22/2022    4:45 PM 12/15/2021    8:37 AM 10/25/2021   11:17 AM 01/28/2021   11:27 AM  Advanced Directives  Does Patient Have a Medical Advance Directive? Yes No No No No No No  Type of Estate agent of [place];Living will        Copy of Healthcare Power of Attorney in Chart? No - copy requested        Would patient like information on creating a medical advance directive?   No - Patient declined    No - Patient declined    Current Medications (verified) Outpatient Encounter Medications as of 12/28/2022  Medication Sig   albuterol (VENTOLIN HFA) 108 (90 Base) MCG/ACT inhaler Inhale 2 puffs into the lungs every 6 (six) hours as needed for wheezing or shortness of breath.   amLODipine (NORVASC) 10 MG tablet Take 1 tablet (10 mg total) by mouth daily.   aspirin EC 81 MG tablet Take 1 tablet (81 mg total) by mouth daily with breakfast.   atorvastatin (LIPITOR) 10 MG tablet Take 1 tablet (10 mg total) by mouth daily.   Cholecalciferol  (VITAMIN D) 125 MCG (5000 UT) CAPS Take 2,000 Units by mouth daily.   cloNIDine (CATAPRES) 0.1 MG tablet Take 1 tablet (0.1 mg total) by mouth 2 (two) times daily.   clopidogrel (PLAVIX) 75 MG tablet TAKE (1) TABLET BY MOUTH ONCE DAILY.   cyclobenzaprine (FLEXERIL) 10 MG tablet Take 1 tablet (10 mg total) by mouth 2 (two) times daily as needed for muscle spasms.   Ferrous Sulfate (IRON) 325 (65 Fe) MG TABS Take 325 mg by mouth daily.   glucose blood test strip 1 each by Other route 2 (two) times daily. Use as instructed bid E11.65 [facility]ion Premier   hydrochlorothiazide (HYDRODIURIL) 25 MG tablet Take 1 tablet (25 mg total) by mouth daily.   insulin glargine (LANTUS SOLOSTAR) 100 UNIT/ML Solostar Pen INJECT 14 UNITS UNDER THE SKIN AT BEDTIME   Lancets MISC 1 each by Does not apply route 2 (two) times daily. E11.65 Relion Premier   lisinopril (ZESTRIL) 40 MG tablet Take 1 tablet (40 mg total) by mouth daily.   molnupiravir EUA (LAGEVRIO) 200 mg CAPS capsule Take 4 capsules (800 mg total) by mouth 2 (two) times daily for 5 days.   nystatin-triamcinolone ointment (MYCOLOG) Apply 1 Application topically 2 (two) times daily.   RESTASIS 0.05 % ophthalmic emulsion Place 1 drop into both eyes  2 (two) times daily as needed.   SURE COMFORT PEN NEEDLES 32G X 4 MM MISC USE ONCE DAILY   traZODone (DESYREL) 100 MG tablet Take 1 tablet (100 mg total) by mouth daily with breakfast. (Patient taking differently: Take 100 mg by mouth at bedtime.)   No facility-administered encounter medications on file as of 12/28/2022.    Allergies (verified) Patient has no known allergies.   History: Past Medical History:  Diagnosis Date   Diabetes mellitus    Fatty liver    GERD (gastroesophageal reflux disease)    HTN (hypertension)    Hyperlipidemia    Insomnia    Renal insufficiency    Past Surgical History:  Procedure Laterality Date   ABDOMINAL AORTOGRAM W/LOWER EXTREMITY N/A 08/22/2018   Procedure: ABDOMINAL  AORTOGRAM W/LOWER EXTREMITY;  Surgeon: [name], MD;  [facility] INVASIVE CV LAB;  Service: Cardiovascular;  Laterality: N/A;   BACK SURGERY     CARPAL TUNNEL WITH CUBITAL TUNNEL Right 03/21/2019   Procedure: RIGHT CARPAL TUNNEL AND CUBITAL  TUNNEL RELEASES;  Surgeon: [name], MD;  Location: [facility];  Service: Orthopedics;  Laterality: Right;  AXILLARY BLOCK   COLONOSCOPY  01/2007   Dr. [name], reviewed report, mild diverticulosis. Prep adequate. Next TCS due 01/2017   ESOPHAGOGASTRODUODENOSCOPY N/A 10/21/2012   [id number] GASTRITIS   MASS EXCISION Right 10/26/2012   Procedure: EXCISION MASS;  Surgeon: [name], MD;  Location: [facility];  Service: General;  Laterality: Right;   PERIPHERAL VASCULAR INTERVENTION  08/22/2018   Procedure: PERIPHERAL VASCULAR INTERVENTION;  Surgeon: [name], MD;  Location: [facility] INVASIVE CV LAB;  Service: Cardiovascular;;  bilateral external iliac stents   scalp mass  4-5 yrs ago   Dr Angelique Holm NERVE TRANSPOSITION Right 03/21/2019   Procedure: DECOMPRESSION RIGHT ULNAR NERVE;  Surgeon: [name], MD;  Location: [facility];  Service: Orthopedics;  Laterality: Right;   Family History  Problem Relation Age of Onset   Diabetes Mother    Diabetes Father    Diabetes Brother    Colon cancer Neg Hx    Liver disease Neg Hx    Social History   Socioeconomic History   Marital status: Married    Spouse name: Not on file   Number of children: 2   Years of education: Not on file   Highest education level: Not on file  Occupational History   Occupation: reitred    Comment: [employer], heavy lifting  Tobacco Use   Smoking status: Every Day    Packs/day: 0.50    Years: 35.00    Additional pack years: 0.00    Total pack years: 17.50    Types: Cigarettes   Smokeless tobacco: Never  Vaping Use   Vaping Use: Never used  Substance and Sexual Activity   Alcohol use: No    Comment: hx heavy etoh  x 3 yrs, quit 30+ yrs ago   Drug use: No   Sexual activity: Yes    Birth control/protection: None  Other Topics Concern   Not on file  Social History Narrative   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 Highest level of education: 12th grade. Married for 12 years. Has 2 sons (33 and 21). Lives with wife.   Social Determinants of Health   Financial Resource Strain: Not on file  Food Insecurity: Not on file  Transportation Needs: Not on file  Physical Activity: Not on file  Stress: Not on file  Social Connections: Not on file    Tobacco Counseling Ready to quit: Not Answered Counseling given: Not Answered   Clinical Intake:  Pre-visit preparation completed: Yes  Pain : No/denies pain Pain Score: 0-No pain     Diabetes: Yes CBG done?: No Did pt. bring in CBG monitor from home?: No  How often do you need to have someone help you when you read instructions, pamphlets, or other written materials from your doctor or pharmacy?: 2 - Rarely What is the last grade level you completed in school?: 12th grade    Activities of Daily Living    12/28/2022    8:56 AM 04/23/2022    2:00 AM  In your present state of health, do you have any difficulty performing the following activities:  Hearing? 0 0  Vision? 0 0  Difficulty concentrating or making decisions? 0 0  Walking or climbing stairs? 0 0  Dressing or bathing? 0 0  Doing errands, shopping? 0 0    Patient Care Team: Anabel Halon, MD as PCP - General (Internal Medicine) Branch, Dorothe Pea, MD as PCP - Cardiology (Cardiology) [place] Bali, MD (Inactive) (Gastroenterology)  Indicate any recent Medical Services you may have received from other than [facility] providers in the past year (date may be approximate).     Assessment:   This is a routine wellness examination for Chase Briggs.  Hearing/Vision screen No results found.  Dietary issues and exercise activities discussed:     Goals Addressed   None    Depression  Screen    12/28/2022    8:56 AM 11/26/2022    8:29 AM 09/15/2022    4:02 PM 07/24/2022    8:27 AM 04/30/2022    1:42 PM 03/06/2022    8:09 AM 01/19/2022    8:10 AM  PHQ 2/9 Scores  PHQ - 2 Score 0 3 2 3  0 0 0  PHQ- 9 Score  8  6       Fall Risk    12/28/2022    8:56 AM 11/26/2022    8:29 AM 09/15/2022    4:02 PM 07/24/2022    8:27 AM 04/30/2022    1:42 PM  Fall Risk   Falls in the past year? 0 0 0 0 0  Number falls in past yr:  0 0 0 0  Injury with Fall?  0 0 0 0  Risk for fall due to :    No Fall Risks No Fall Risks  Follow up    Falls evaluation completed Falls evaluation completed    FALL RISK PREVENTION PERTAINING TO THE HOME:  Any stairs in or around the home? Yes  If so, are there any without handrails? Yes  Home free of loose throw rugs in walkways, pet beds, electrical cords, etc? Yes  Adequate lighting in your home to reduce risk of falls? Yes   ASSISTIVE DEVICES UTILIZED TO PREVENT FALLS:  Life alert? No  Use of a cane, walker or w/c? No  Grab bars in the bathroom? Yes  Shower chair or bench in shower? Yes  Elevated toilet seat or a handicapped toilet? Yes    Cognitive Function:        12/28/2022    8:56 AM 12/15/2021    8:40 AM 12/12/2020   10:35 AM 11/14/2019    9:11 AM  6CIT Screen  What Year? 0 points 0 points 0 points 0 points  What month? 0 points 0 points 0 points 0 points  What  time? 0 points 0 points 0 points 0 points  Count back from 20 0 points 0 points 0 points 0 points  Months in reverse 0 points 2 points 2 points 0 points  Repeat phrase 0 points 0 points 0 points 0 points  Total Score 0 points 2 points 2 points 0 points    Immunizations Immunization History  Administered Date(s) Administered   Fluad Quad(high Dose 65+) 08/27/2021, 07/24/2022   Moderna Sars-Covid-2 Vaccination 01/04/2020, 02/06/2020   PNEUMOCOCCAL CONJUGATE-20 07/24/2022    TDAP status: Due, Education has been provided regarding the importance of this vaccine. Advised may  receive this vaccine at local pharmacy or Health Dept. Aware to provide a copy of the vaccination record if obtained from local pharmacy or Health Dept. Verbalized acceptance and understanding.  Flu Vaccine status: Up to date  Pneumococcal vaccine status: Up to date  Covid-19 vaccine status: Information provided on how to obtain vaccines.   Qualifies for Shingles Vaccine? Yes   Zostavax completed No   Shingrix Completed?: No.    Education has been provided regarding the importance of this vaccine. Patient has been advised to call insurance company to determine out of pocket expense if they have not yet received this vaccine. Advised may also receive vaccine at local pharmacy or Health Dept. Verbalized acceptance and understanding.  Screening Tests Health Maintenance  Topic Date Due   DTaP/Tdap/Td (1 - Tdap) Never done   Zoster Vaccines- Shingrix (1 of 2) Never done   Medicare Annual Wellness (AWV)  12/16/2022   FOOT EXAM  01/20/2023   HEMOGLOBIN A1C  02/25/2023   INFLUENZA VACCINE  04/15/2023   OPHTHALMOLOGY EXAM  09/23/2023   Diabetic kidney evaluation - Urine ACR  11/26/2023   Diabetic kidney evaluation - eGFR measurement  12/25/2023   Fecal DNA (Cologuard)  01/22/2025   Pneumonia Vaccine 25+ Years old  Completed   Hepatitis C Screening  Completed   HPV VACCINES  Aged Out   Lung Cancer Screening  Discontinued   COLONOSCOPY (Pts 45-4yrs Insurance coverage will need to be confirmed)  Discontinued   COVID-19 Vaccine  Discontinued    Health Maintenance  Health Maintenance Due  Topic Date Due   DTaP/Tdap/Td (1 - Tdap) Never done   Zoster Vaccines- Shingrix (1 of 2) Never done   Medicare Annual Wellness (AWV)  12/16/2022    Colorectal cancer screening: Type of screening: Cologuard. Completed 01/22/2022. Repeat every 3 years  Lung Cancer Screening: (Low Dose CT Chest recommended if Age 37-80 years, 30 pack-year currently smoking OR have quit w/in 15years.) does qualify.    Lung Cancer Screening Referral: CT completed 09/06/2022  Additional Screening:  Hepatitis C Screening: does not qualify; Completed 12/27/2018  Vision Screening: Recommended annual ophthalmology exams for early detection of glaucoma and other disorders of the eye. Is the patient up to date with their annual eye exam?  Yes  Who is the provider or what is the name of the office in which the patient attends annual eye exams? [facility] If pt is not established with a provider, would they like to be referred to a provider to establish care? No .   Dental Screening: Recommended annual dental exams for proper oral hygiene  Community Resource Referral / Chronic Care Management: CRR required this visit?  No   CCM required this visit?  No      Plan:     I have personally reviewed and noted the following in the patient's chart:   Medical and social  history Use of alcohol, tobacco or illicit drugs  Current medications and supplements including opioid prescriptions. Patient is not currently taking opioid prescriptions. Functional ability and status Nutritional status Physical activity Advanced directives List of other physicians Hospitalizations, surgeries, and ER visits in previous 12 months Vitals Screenings to include cognitive, depression, and falls Referrals and appointments  In addition, I have reviewed and discussed with patient certain preventive protocols, quality metrics, and best practice recommendations. A written personalized care plan for preventive services as well as general preventive health recommendations were provided to patient.     Chase Banister, MD   12/28/2022

## 2022-12-28 NOTE — Patient Instructions (Signed)
  Chase Briggs , Thank you for taking time to come for your Medicare Wellness Visit. I appreciate your ongoing commitment to your health goals. Please review the following plan we discussed and let me know if I can assist you in the future.   These are the goals we discussed:  Goals      Patient Stated     Patient states that his goal is to stop smoking cigarettes.         This is a list of the screening recommended for you and due dates:  Health Maintenance  Topic Date Due   DTaP/Tdap/Td vaccine (1 - Tdap) Never done   Zoster (Shingles) Vaccine (1 of 2) Never done   Medicare Annual Wellness Visit  12/16/2022   Complete foot exam   01/20/2023   Hemoglobin A1C  02/25/2023   Flu Shot  04/15/2023   Eye exam for diabetics  09/23/2023   Yearly kidney health urinalysis for diabetes  11/26/2023   Yearly kidney function blood test for diabetes  12/25/2023   Cologuard (Stool DNA test)  01/22/2025   Pneumonia Vaccine  Completed   Hepatitis C Screening: USPSTF Recommendation to screen - Ages 31-79 yo.  Completed   HPV Vaccine  Aged Out   Screening for Lung Cancer  Discontinued   Colon Cancer Screening  Discontinued   COVID-19 Vaccine  Discontinued

## 2022-12-29 ENCOUNTER — Telehealth: Payer: Self-pay

## 2022-12-29 NOTE — Telephone Encounter (Signed)
     Patient  visit on 12/25/2022  at [facility] was for weakness, COVID-19.  Have you been able to follow up with your primary care physician? Yes  The patient was or was not able to obtain any needed medicine or equipment. Patient was able to obtain medication.  Are there diet recommendations that you are having difficulty following? No  Patient expresses understanding of discharge instructions and education provided has no other needs at this time. Yes   Sorren Vallier [facility]  [facility] Population Health Community Resource Care Guide   [email or web address]  ?? [phone]   Website: [email or web address]  [email or web address]

## 2022-12-30 ENCOUNTER — Telehealth (INDEPENDENT_AMBULATORY_CARE_PROVIDER_SITE_OTHER): Payer: Medicare Other | Admitting: Internal Medicine

## 2022-12-30 ENCOUNTER — Encounter: Payer: Self-pay | Admitting: Internal Medicine

## 2022-12-30 DIAGNOSIS — U071 COVID-19: Secondary | ICD-10-CM

## 2022-12-30 MED ORDER — PROMETHAZINE-DM 6.25-15 MG/5ML PO SYRP: 5.0000 mL | ORAL_SOLUTION | Freq: Four times a day (QID) | ORAL | 0 refills | Status: DC | PRN

## 2022-12-30 NOTE — Progress Notes (Signed)
Virtual Visit via Video Note   Because of Chase Briggs's co-morbid illnesses, he is at least at moderate risk for complications without adequate follow up.  This format is felt to be most appropriate for this patient at this time.  All issues noted in this document were discussed and addressed.  A limited physical exam was performed with this format.      Evaluation Performed:  Follow-up visit  Date:  12/30/2022   ID:  Chase Briggs, DOB 23-May-1951, MRN [id number]  Patient Location: Home Provider Location: Office/Clinic  Participants: Patient Location of Patient: Home Location of Provider: Telehealth Consent was obtain for visit to be over via telehealth. I verified that I am speaking with the correct person using two identifiers.  PCP:  Anabel Halon, MD   Chief Complaint: Cough  History of Present Illness:    Chase Briggs is a 72 y.o. male who has a video visit for follow-up of recent ER visit for COVID.  He was having cough, nasal congestion and fatigue, went to ER on 04/12, and tested positive for COVID.  He was given molnupiravir, which he has completed now.  He has noticed improvement in his nasal congestion and fatigue now.  His diarrhea has resolved now.  Denies any fever, chills, dyspnea or wheezing currently.  He still has cough with clear sputum production and has tried taking Mucinex max for it.  The patient does have symptoms concerning for COVID-19 infection (fever, chills, cough, or new shortness of breath).   Past Medical, Surgical, Social History, Allergies, and Medications have been Reviewed.  Past Medical History:  Diagnosis Date   Diabetes mellitus    Fatty liver    GERD (gastroesophageal reflux disease)    HTN (hypertension)    Hyperlipidemia    Insomnia    Renal insufficiency    Past Surgical History:  Procedure Laterality Date   ABDOMINAL AORTOGRAM W/LOWER EXTREMITY N/A 08/22/2018   Procedure: ABDOMINAL AORTOGRAM  W/LOWER EXTREMITY;  Surgeon: [name], MD;  Location: [facility] INVASIVE CV LAB;  Service: Cardiovascular;  Laterality: N/A;   BACK SURGERY     CARPAL TUNNEL WITH CUBITAL TUNNEL Right 03/21/2019   Procedure: RIGHT CARPAL TUNNEL AND CUBITAL  TUNNEL RELEASES;  Surgeon: [name], MD;  Location: [facility];  Service: Orthopedics;  Laterality: Right;  AXILLARY BLOCK   COLONOSCOPY  01/2007   Dr. [name], reviewed report, mild diverticulosis. Prep adequate. Next TCS due 01/2017   ESOPHAGOGASTRODUODENOSCOPY N/A 10/21/2012   ION:GEXBMWUX GASTRITIS   MASS EXCISION Right 10/26/2012   Procedure: EXCISION MASS;  Surgeon: [name], MD;  Location: [facility];  Service: General;  Laterality: Right;   PERIPHERAL VASCULAR INTERVENTION  08/22/2018   Procedure: PERIPHERAL VASCULAR INTERVENTION;  Surgeon: [name], MD;  Location: [facility] INVASIVE CV LAB;  Service: Cardiovascular;;  bilateral external iliac stents   scalp mass  4-5 yrs ago   Dr Angelique Holm NERVE TRANSPOSITION Right 03/21/2019   Procedure: DECOMPRESSION RIGHT ULNAR NERVE;  Surgeon: [name], MD;  Location: [facility];  Service: Orthopedics;  Laterality: Right;     No outpatient medications have been marked as taking for the 12/30/22 encounter (Video Visit) with Anabel Halon, MD.     Allergies:   Patient has no known allergies.   ROS:   Please see the history of present illness.     All other systems reviewed and are negative.   Labs/Other  Tests and Data Reviewed:    Recent Labs: 12/25/2022: ALT 14; BUN 20; Creatinine, Ser 1.75; Hemoglobin 14.0; Magnesium 1.8; Platelets 146; Potassium 3.7; Sodium 131   Recent Lipid Panel Lab Results  Component Value Date/Time   CHOL 108 08/18/2022 08:03 AM   TRIG 45 08/18/2022 08:03 AM   HDL 48 08/18/2022 08:03 AM   CHOLHDL 2.3 08/18/2022 08:03 AM   CHOLHDL 2.1 04/23/2022 01:42 AM   LDLCALC 49 08/18/2022 08:03 AM   LDLCALC 56 12/12/2019 09:05  AM    Wt Readings from Last 3 Encounters:  12/25/22 130 lb (59 kg)  11/26/22 130 lb 12.8 oz (59.3 kg)  10/15/22 131 lb (59.4 kg)     Objective:    Vital Signs:  There were no vitals taken for this visit.   VITAL SIGNS:  reviewed GEN:  no acute distress EYES:  sclerae anicteric, EOMI - Extraocular Movements Intact RESPIRATORY:  normal respiratory effort, symmetric expansion NEURO:  alert and oriented x 3, no obvious focal deficit PSYCH:  normal affect  ASSESSMENT & PLAN:    COVID-19 infection Completed molnupiravir, symptoms improved now Still has cough, started Promethazine DM syrup as needed for cough Tylenol as needed for fever or myalgias ER chart, including blood tests and imaging reviewed   I discussed the assessment and treatment plan with the patient. The patient was provided an opportunity to ask questions, and all were answered. The patient agreed with the plan and demonstrated an understanding of the instructions.   The patient was advised to call back or seek an in-person evaluation if the symptoms worsen or if the condition fails to improve as anticipated.  The above assessment and management plan was discussed with the patient. The patient verbalized understanding of and has agreed to the management plan.   Medication Adjustments/Labs and Tests Ordered: Current medicines are reviewed at length with the patient today.  Concerns regarding medicines are outlined above.   Tests Ordered: No orders of the defined types were placed in this encounter.   Medication Changes: No orders of the defined types were placed in this encounter.    Note: This dictation was prepared with Dragon dictation along with smaller phrase technology. Similar sounding words can be transcribed inadequately or may not be corrected upon review. Any transcriptional errors that result from this process are unintentional.      Disposition:  Follow up  Signed, Anabel Halon, MD   12/30/2022 11:36 AM     [facility]

## 2022-12-30 NOTE — Patient Instructions (Signed)
Please take Promethazine-DM syrup as needed for cough.

## 2023-01-18 ENCOUNTER — Encounter (HOSPITAL_COMMUNITY): Payer: Self-pay | Admitting: *Deleted

## 2023-01-18 ENCOUNTER — Other Ambulatory Visit: Payer: Self-pay

## 2023-01-18 ENCOUNTER — Emergency Department (HOSPITAL_COMMUNITY): Payer: Medicare Other

## 2023-01-18 ENCOUNTER — Emergency Department (HOSPITAL_COMMUNITY)
Admission: EM | Admit: 2023-01-18 | Discharge: 2023-01-18 | Disposition: A | Discharge status: Home / Self Care | Payer: Medicare Other | Attending: Emergency Medicine | Admitting: Emergency Medicine

## 2023-01-18 DIAGNOSIS — J449 Chronic obstructive pulmonary disease, unspecified: Secondary | ICD-10-CM | POA: Diagnosis not present

## 2023-01-18 DIAGNOSIS — Z79899 Other long term (current) drug therapy: Secondary | ICD-10-CM | POA: Diagnosis not present

## 2023-01-18 DIAGNOSIS — R051 Acute cough: Secondary | ICD-10-CM | POA: Insufficient documentation

## 2023-01-18 DIAGNOSIS — E119 Type 2 diabetes mellitus without complications: Secondary | ICD-10-CM | POA: Insufficient documentation

## 2023-01-18 DIAGNOSIS — R109 Unspecified abdominal pain: Secondary | ICD-10-CM | POA: Diagnosis not present

## 2023-01-18 DIAGNOSIS — R079 Chest pain, unspecified: Secondary | ICD-10-CM | POA: Diagnosis not present

## 2023-01-18 DIAGNOSIS — Z7982 Long term (current) use of aspirin: Secondary | ICD-10-CM | POA: Insufficient documentation

## 2023-01-18 DIAGNOSIS — K4090 Unilateral inguinal hernia, without obstruction or gangrene, not specified as recurrent: Secondary | ICD-10-CM | POA: Diagnosis not present

## 2023-01-18 DIAGNOSIS — Z794 Long term (current) use of insulin: Secondary | ICD-10-CM | POA: Diagnosis not present

## 2023-01-18 DIAGNOSIS — I10 Essential (primary) hypertension: Secondary | ICD-10-CM | POA: Diagnosis not present

## 2023-01-18 DIAGNOSIS — Z7902 Long term (current) use of antithrombotics/antiplatelets: Secondary | ICD-10-CM | POA: Insufficient documentation

## 2023-01-18 DIAGNOSIS — R059 Cough, unspecified: Secondary | ICD-10-CM | POA: Diagnosis not present

## 2023-01-18 MED ORDER — PREDNISONE 20 MG PO TABS
40.0000 mg | ORAL_TABLET | Freq: Once | ORAL | Status: AC
  Administered 2023-01-18: 40 mg via ORAL
  Filled 2023-01-18: qty 2

## 2023-01-18 MED ORDER — BENZONATATE 100 MG PO CAPS
100.0000 mg | ORAL_CAPSULE | Freq: Once | ORAL | Status: AC
  Administered 2023-01-18: 100 mg via ORAL
  Filled 2023-01-18: qty 1

## 2023-01-18 MED ORDER — PREDNISONE 10 MG PO TABS: 40.0000 mg | ORAL_TABLET | Freq: Every day | ORAL | 0 refills | Status: AC

## 2023-01-18 NOTE — ED Provider Notes (Signed)
[facility] AT [facility] Provider Note   CSN: [id number] Arrival date & time: 01/18/23  7829     History  Chief Complaint  Patient presents with   Abdominal Pain    Chase Briggs is a 72 y.o. male.  [facility] complaining of persistent cough.  Diagnosed with COVID-19 on 4/12, states he took the medication that was prescribed at that time (molnupiravir).  He Is Still Having Cough and Follow with His PCP was prescribed Promethazine DM.  He states he still having persistent cough and over the past week has had pain in the left groin with coughing.  Pain is not constant, no fevers or chills, no nausea or vomiting.  No bulging in the area that is persistent.  Pain occasionally radiates into the testicle but denies testicular swelling or pain other than when coughing.   Abdominal Pain Associated symptoms: no chills and no fever        Home Medications Prior to Admission medications   Medication Sig Start Date End Date Taking? Authorizing Provider  albuterol (VENTOLIN HFA) 108 (90 Base) MCG/ACT inhaler Inhale 2 puffs into the lungs every 6 (six) hours as needed for wheezing or shortness of breath. 11/26/22  Yes Anabel Halon, MD  amLODipine (NORVASC) 10 MG tablet Take 1 tablet (10 mg total) by mouth daily. 04/23/22  Yes Shon Hale, MD  aspirin EC 81 MG tablet Take 1 tablet (81 mg total) by mouth daily with breakfast. 04/23/22  Yes Emokpae, Courage, MD  atorvastatin (LIPITOR) 10 MG tablet Take 1 tablet (10 mg total) by mouth daily. 04/23/22  Yes Emokpae, Courage, MD  Cholecalciferol (VITAMIN D) 125 MCG (5000 UT) CAPS Take 2,000 Units by mouth daily. 04/23/22  Yes Emokpae, Courage, MD  cloNIDine (CATAPRES) 0.1 MG tablet Take 1 tablet (0.1 mg total) by mouth 2 (two) times daily. 04/23/22  Yes Emokpae, Courage, MD  clopidogrel (PLAVIX) 75 MG tablet TAKE (1) TABLET BY MOUTH ONCE DAILY. 04/23/22  Yes Emokpae, Courage, MD  hydrochlorothiazide (HYDRODIURIL) 25  MG tablet Take 1 tablet (25 mg total) by mouth daily. 09/21/22  Yes Antoine Poche, MD  insulin glargine (LANTUS SOLOSTAR) 100 UNIT/ML Solostar Pen INJECT 14 UNITS UNDER THE SKIN AT BEDTIME 12/08/22  Yes Nida, Denman George, MD  lisinopril (ZESTRIL) 40 MG tablet Take 1 tablet (40 mg total) by mouth daily. 09/21/22  Yes Branch, Dorothe Pea, MD  predniSONE (DELTASONE) 10 MG tablet Take 4 tablets (40 mg total) by mouth daily for 4 days. 01/19/23 01/23/23 Yes Renaye Janicki A, PA-C  RESTASIS 0.05 % ophthalmic emulsion Place 1 drop into both eyes 2 (two) times daily as needed. 10/02/21  Yes [provider]  traZODone (DESYREL) 100 MG tablet Take 1 tablet (100 mg total) by mouth daily with breakfast. Patient taking differently: Take 100 mg by mouth at bedtime. 11/26/22  Yes Anabel Halon, MD  cyclobenzaprine (FLEXERIL) 10 MG tablet Take 1 tablet (10 mg total) by mouth 2 (two) times daily as needed for muscle spasms. Patient not taking: Reported on 01/18/2023 09/15/22   Anabel Halon, MD  glucose blood test strip 1 each by Other route 2 (two) times daily. Use as instructed bid E11.65 Relion Premier 05/15/19   Roma Kayser, MD  Lancets MISC 1 each by Does not apply route 2 (two) times daily. E11.65 Relion Premier 05/15/19   Roma Kayser, MD  promethazine-dextromethorphan (PROMETHAZINE-DM) 6.25-15 MG/5ML syrup Take 5 mLs by mouth 4 (four)  times daily as needed for cough. Patient not taking: Reported on 01/18/2023 12/30/22   Anabel Halon, MD  SURE COMFORT PEN NEEDLES 32G X 4 MM MISC USE ONCE DAILY 10/30/22   Roma Kayser, MD      Allergies    Patient has no known allergies.    Review of Systems   Review of Systems  Constitutional:  Negative for chills and fever.  Gastrointestinal:  Positive for abdominal pain.    Physical Exam Updated Vital Signs BP (!) 147/68   Pulse 78   Temp 98.7 F (37.1 C) (Oral)   Resp 16   Wt 59 kg   SpO2 96%   BMI 20.36 kg/m  Physical  Exam Vitals and nursing note reviewed. Exam conducted with a chaperone present.  Constitutional:      General: He is not in acute distress.    Appearance: He is well-developed.  HENT:     Head: Normocephalic and atraumatic.  Eyes:     Conjunctiva/sclera: Conjunctivae normal.  Cardiovascular:     Rate and Rhythm: Normal rate and regular rhythm.     Heart sounds: No murmur heard. Pulmonary:     Effort: Pulmonary effort is normal. No respiratory distress.     Breath sounds: Normal breath sounds.  Abdominal:     General: Abdomen is flat.     Palpations: Abdomen is soft.     Tenderness: There is no abdominal tenderness.     Hernia: A hernia is present. Hernia is present in the left inguinal area.  Genitourinary:    Testes: Normal.  Musculoskeletal:        General: No swelling.     Cervical back: Neck supple.  Skin:    General: Skin is warm and dry.     Capillary Refill: Capillary refill takes less than 2 seconds.  Neurological:     Mental Status: He is alert.  Psychiatric:        Mood and Affect: Mood normal.     ED Results / Procedures / Treatments   Labs (all labs ordered are listed, but only abnormal results are displayed) Labs Reviewed - No data to display  EKG None  Radiology DG Chest 2 View  Result Date: 01/18/2023 CLINICAL DATA:  Recent COVID-19, continued cough, abdominal pain radiating to LEFT groin EXAM: CHEST - 2 VIEW COMPARISON:  12/25/2022 FINDINGS: Normal heart size, mediastinal contours, and pulmonary vascularity. Emphysematous and bronchitic changes consistent with COPD. No pulmonary infiltrate, pleural effusion, or pneumothorax. Degenerative changes thoracic spine. IMPRESSION: COPD changes without acute abnormalities. Emphysema (ICD10-J43.9). Electronically Signed   By: Ulyses Southward M.D.   On: 01/18/2023 12:19    Procedures Procedures    Medications Ordered in ED Medications  benzonatate (TESSALON) capsule 100 mg (100 mg Oral Given 01/18/23 1153)   predniSONE (DELTASONE) tablet 40 mg (40 mg Oral Given 01/18/23 1231)    ED Course/ Medical Decision Making/ A&P                             Medical Decision Making This patient presents to the ED for concern of persistent cough left inguinal pain, this involves an extensive number of treatment options, and is a complaint that carries with it a high risk of complications and morbidity.  The differential diagnosis includes otitis, pneumonia, COPD exacerbation, hernia, muscle strain, other   Co morbidities that complicate the patient evaluation  Diabetes, hypertension, high cholesterol, fatty liver  Additional history obtained:  Additional history obtained from EMR External records from outside source obtained and reviewed including recent PCP and ED visit     Imaging Studies ordered:  I ordered imaging studies no pulmonary edema or pneumonia on chest x-ray on my independent interpretation of chest x-ray  I agree with the radiologist interpretation     Problem List / ED Course / Critical interventions / Medication management  Show with the having cough since being diagnosed with COVID-19.  Does have history of COPD, cough is nonproductive, no fevers not improved with medications at home.  Will give steroids, chest x-ray negative.  Has been having left groin pain and noted to have on exam a hernia that protrudes when coughing easily reducible.  Otherwise normal exam.  We discussed the findings and appropriate outpatient follow-up, using hand to apply pressure if he is going to be coughing or lifting and appropriate return precautions. She was given [name] for cough with mild improvement I have reviewed the patients home medicines and have made adjustments as needed     Amount and/or Complexity of Data Reviewed Radiology: ordered.  Risk Prescription drug management.           Final Clinical Impression(s) / ED Diagnoses Final diagnoses:  Left inguinal  hernia  Acute cough    Rx / DC Orders ED Discharge Orders          Ordered    predniSONE (DELTASONE) 10 MG tablet  Daily        01/18/23 1227              Josem Kaufmann 01/18/23 1239    Bethann Berkshire, MD 01/20/23 1550

## 2023-01-18 NOTE — ED Triage Notes (Signed)
Pt with recent covid , with continued cough.  Also c/o abd pain to that radiates to left groin.

## 2023-01-18 NOTE — Discharge Instructions (Signed)
It was a pleasure taking care of you today.  Your chest x-ray did not show any pneumonia but did show some changes of COPD.  We are giving you prescription for steroids which may help with the cough.  Continue using your inhaler as needed as well.  Back if you have shortness of breath, fevers or any other worsening symptoms.  Regarding the pain in your groin, a hernia was felt on exam.  Follow-up with the surgeon.  If you have swelling in the area or pain at that does not go away or gets a lot worse come back to the ER right away. Pressure on that area when coughing may help.

## 2023-01-19 ENCOUNTER — Telehealth: Payer: Self-pay | Admitting: "Endocrinology

## 2023-01-19 NOTE — Telephone Encounter (Signed)
Spoke with pt, he stated he started prednisone yesterday and has noticed elevation in BG. Pt BG readings.   Date Before breakfast Before lunch Before supper Bedtime  01/17/2023 150   179  01/18/2023 229   375  01/19/2023 286             Pt taking: Lantus 14 units daily in the a.m.

## 2023-01-19 NOTE — Telephone Encounter (Signed)
Spoke with pt, advised him to increase his lantus to 20 units daily each morning. Understanding voiced. He stated he had not taken his lantus this morning as of yet but was getting ready to eat and would take the 20 units.

## 2023-01-19 NOTE — Telephone Encounter (Signed)
Patient is currently on Prednisone and his sugar is high.  Last night it was 375 , this morning it was 286 and he checked it again right after and it was 214. He thinks maybe the Meter may be wrong. He has tried 2 different meters.

## 2023-01-20 ENCOUNTER — Ambulatory Visit (INDEPENDENT_AMBULATORY_CARE_PROVIDER_SITE_OTHER): Payer: Medicare Other | Admitting: Internal Medicine

## 2023-01-20 ENCOUNTER — Encounter: Payer: Self-pay | Admitting: Internal Medicine

## 2023-01-20 VITALS — BP 136/74 | HR 89 | Ht 67.0 in | Wt 130.6 lb

## 2023-01-20 DIAGNOSIS — Z09 Encounter for follow-up examination after completed treatment for conditions other than malignant neoplasm: Secondary | ICD-10-CM

## 2023-01-20 DIAGNOSIS — U099 Post covid-19 condition, unspecified: Secondary | ICD-10-CM

## 2023-01-20 DIAGNOSIS — K4090 Unilateral inguinal hernia, without obstruction or gangrene, not specified as recurrent: Secondary | ICD-10-CM | POA: Insufficient documentation

## 2023-01-20 DIAGNOSIS — E1169 Type 2 diabetes mellitus with other specified complication: Secondary | ICD-10-CM | POA: Diagnosis not present

## 2023-01-20 DIAGNOSIS — R053 Chronic cough: Secondary | ICD-10-CM | POA: Diagnosis not present

## 2023-01-20 DIAGNOSIS — Z794 Long term (current) use of insulin: Secondary | ICD-10-CM | POA: Diagnosis not present

## 2023-01-20 MED ORDER — BENZONATATE 200 MG PO CAPS: 200.0000 mg | ORAL_CAPSULE | Freq: Two times a day (BID) | ORAL | 0 refills | Status: DC | PRN

## 2023-01-20 NOTE — Assessment & Plan Note (Signed)
ER chart reviewed including imaging Has post-COVID chronic cough, on oral prednisone now

## 2023-01-20 NOTE — Assessment & Plan Note (Signed)
Likely benign cough Continue oral prednisone Tessalon as needed for cough

## 2023-01-20 NOTE — Patient Instructions (Signed)
Please take Tessalon as needed for cough.  You are being scheduled to get CT abdomen pelvis done.

## 2023-01-20 NOTE — Assessment & Plan Note (Signed)
Lab Results  Component Value Date   HGBA1C 6.1 08/26/2022   Associated with HTN and PAD Well-controlled with Lantus 14 units every morning, followed by Dr. Fransico Him Recent hyperglycemia due to oral steroid, increased dose of Lantus to 20 units every morning Advised to follow diabetic diet On statin and ACEi F/u CMP and lipid panel Diabetic eye exam: Advised to follow up with Ophthalmology for diabetic eye exam

## 2023-01-20 NOTE — Progress Notes (Signed)
Established Patient Office Visit  Subjective:  Patient ID: Chase Briggs, male    DOB: 02/23/1951  Age: 72 y.o. MRN: [id number]  CC:  Chief Complaint  Patient presents with   Follow-up    ED follow up     HPI Chase Briggs is a 72 y.o. male with past medical history of HTN, PAD, allergic rhinitis, type II DM, HLD, insomnia, anemia and tobacco abuse who presents for f/u of recent ER visit.  He went to ER on 05/06 for persistent cough since his COVID infection about 3 weeks ago.  He has completed molnupiravir.  He had chest x-ray done, which was negative for any acute etiology.  He was given oral prednisone.  He was given Tessalon while in the ER, which had helped him and requests a prescription of it.  He denies any fever, chills, dyspnea or wheezing currently.  He was also complaining of the left groin pain while coughing.  He has left-sided inguinal hernia, which is reducible.  He was referred to general surgeon for further evaluation.  He denies any dysuria, hematuria or scrotal swelling currently.  He reports hyperglycemia with oral prednisone.  He was advised to increase dose of Lantus to 20 units by his endocrinologist.  Past Medical History:  Diagnosis Date   Diabetes mellitus    Fatty liver    GERD (gastroesophageal reflux disease)    HTN (hypertension)    Hyperlipidemia    Insomnia    Renal insufficiency     Past Surgical History:  Procedure Laterality Date   ABDOMINAL AORTOGRAM W/LOWER EXTREMITY N/A 08/22/2018   Procedure: ABDOMINAL AORTOGRAM W/LOWER EXTREMITY;  Surgeon: [name], MD;  Location: [facility] INVASIVE CV LAB;  Service: Cardiovascular;  Laterality: N/A;   BACK SURGERY     CARPAL TUNNEL WITH CUBITAL TUNNEL Right 03/21/2019   Procedure: RIGHT CARPAL TUNNEL AND CUBITAL  TUNNEL RELEASES;  Surgeon: [name], MD;  Location: [facility];  Service: Orthopedics;  Laterality: Right;  AXILLARY BLOCK   COLONOSCOPY  01/2007   Dr.  [name], reviewed report, mild diverticulosis. Prep adequate. Next TCS due 01/2017   ESOPHAGOGASTRODUODENOSCOPY N/A 10/21/2012   WGN:[id number] GASTRITIS   MASS EXCISION Right 10/26/2012   Procedure: EXCISION MASS;  Surgeon: [name], MD;  Location: [facility];  Service: General;  Laterality: Right;   PERIPHERAL VASCULAR INTERVENTION  08/22/2018   Procedure: PERIPHERAL VASCULAR INTERVENTION;  Surgeon: [name], MD;  Location: [facility] INVASIVE CV LAB;  Service: Cardiovascular;;  bilateral external iliac stents   scalp mass  4-5 yrs ago   Dr Angelique Holm NERVE TRANSPOSITION Right 03/21/2019   Procedure: DECOMPRESSION RIGHT ULNAR NERVE;  Surgeon: [name], MD;  Location:  SURGERY CENTER;  Service: Orthopedics;  Laterality: Right;    Family History  Problem Relation Age of Onset   Diabetes Mother    Diabetes Father    Diabetes Brother    Colon cancer Neg Hx    Liver disease Neg Hx     Social History   Socioeconomic History   Marital status: Married    Spouse name: Not on file   Number of children: 2   Years of education: Not on file   Highest education level: Not on file  Occupational History   Occupation: reitred    Comment: [employer], heavy lifting  Tobacco Use   Smoking status: Every Day    Packs/day: 0.50    Years: 35.00    Additional  pack years: 0.00    Total pack years: 17.50    Types: Cigarettes   Smokeless tobacco: Never  Vaping Use   Vaping Use: Never used  Substance and Sexual Activity   Alcohol use: No    Comment: hx heavy etoh x 3 yrs, quit 30+ yrs ago   Drug use: No   Sexual activity: Yes    Birth control/protection: None  Other Topics Concern   Not on file  Social History Narrative   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 Highest level of education: 12th grade. Married for 12 years. Has 2 sons (33 and 21). Lives with wife.   Social Determinants of Health   Financial Resource Strain: Not on file  Food Insecurity: Not on file   Transportation Needs: Not on file  Physical Activity: Not on file  Stress: Not on file  Social Connections: Not on file  Intimate Partner Violence: Not on file    Outpatient Medications Prior to Visit  Medication Sig Dispense Refill   albuterol (VENTOLIN HFA) 108 (90 Base) MCG/ACT inhaler Inhale 2 puffs into the lungs every 6 (six) hours as needed for wheezing or shortness of breath. 20.1 each 1   amLODipine (NORVASC) 10 MG tablet Take 1 tablet (10 mg total) by mouth daily. 90 tablet 3   aspirin EC 81 MG tablet Take 1 tablet (81 mg total) by mouth daily with breakfast. 30 tablet 11   atorvastatin (LIPITOR) 10 MG tablet Take 1 tablet (10 mg total) by mouth daily. 90 tablet 3   Cholecalciferol (VITAMIN D) 125 MCG (5000 UT) CAPS Take 2,000 Units by mouth daily. 30 capsule 3   cloNIDine (CATAPRES) 0.1 MG tablet Take 1 tablet (0.1 mg total) by mouth 2 (two) times daily. 180 tablet 3   clopidogrel (PLAVIX) 75 MG tablet TAKE (1) TABLET BY MOUTH ONCE DAILY. 90 tablet 3   cyclobenzaprine (FLEXERIL) 10 MG tablet Take 1 tablet (10 mg total) by mouth 2 (two) times daily as needed for muscle spasms. (Patient not taking: Reported on 01/18/2023) 30 tablet 0   glucose blood test strip 1 each by Other route 2 (two) times daily. Use as instructed bid E11.65 Relion Premier 100 each 5   hydrochlorothiazide (HYDRODIURIL) 25 MG tablet Take 1 tablet (25 mg total) by mouth daily. 30 tablet 6   insulin glargine (LANTUS SOLOSTAR) 100 UNIT/ML Solostar Pen INJECT 14 UNITS UNDER THE SKIN AT BEDTIME 15 mL 1   Lancets MISC 1 each by Does not apply route 2 (two) times daily. E11.65 Relion Premier 100 each 5   lisinopril (ZESTRIL) 40 MG tablet Take 1 tablet (40 mg total) by mouth daily. 30 tablet 6   predniSONE (DELTASONE) 10 MG tablet Take 4 tablets (40 mg total) by mouth daily for 4 days. 16 tablet 0   promethazine-dextromethorphan (PROMETHAZINE-DM) 6.25-15 MG/5ML syrup Take 5 mLs by mouth 4 (four) times daily as needed for  cough. (Patient not taking: Reported on 01/18/2023) 118 mL 0   RESTASIS 0.05 % ophthalmic emulsion Place 1 drop into both eyes 2 (two) times daily as needed.     SURE COMFORT PEN NEEDLES 32G X 4 MM MISC USE ONCE DAILY 100 each 3   traZODone (DESYREL) 100 MG tablet Take 1 tablet (100 mg total) by mouth daily with breakfast. (Patient taking differently: Take 100 mg by mouth at bedtime.) 90 tablet 1   No facility-administered medications prior to visit.    No Known Allergies  ROS Review of Systems  Constitutional:  Negative for chills and fever.  HENT:  Negative for congestion, postnasal drip and sore throat.   Respiratory:  Negative for cough and shortness of breath.   Cardiovascular:  Negative for chest pain and palpitations.  Gastrointestinal:  Positive for constipation. Negative for diarrhea and vomiting.  Genitourinary:  Negative for dysuria and hematuria.  Musculoskeletal:  Negative for neck pain and neck stiffness.  Skin:  Negative for rash.  Neurological:  Negative for dizziness and weakness.  Psychiatric/Behavioral:  Positive for sleep disturbance. Negative for agitation and behavioral problems.       Objective:    Physical Exam Vitals reviewed.  Constitutional:      General: He is not in acute distress.    Appearance: He is not diaphoretic.  HENT:     Head: Normocephalic and atraumatic.     Nose: No congestion.     Mouth/Throat:     Mouth: Mucous membranes are moist.  Eyes:     General: No scleral icterus.    Extraocular Movements: Extraocular movements intact.  Cardiovascular:     Rate and Rhythm: Normal rate and regular rhythm.     Pulses: Normal pulses.     Heart sounds: Normal heart sounds. No murmur heard. Pulmonary:     Breath sounds: Normal breath sounds. No wheezing or rales.  Abdominal:     Palpations: Abdomen is soft.     Tenderness: There is no abdominal tenderness.     Hernia: A hernia (L inguinal) is present.  Musculoskeletal:     Cervical back:  Neck supple. No tenderness.     Right lower leg: No edema.     Left lower leg: No edema.  Skin:    General: Skin is warm.     Findings: No rash.     Comments: Clubbing of fingernails  Neurological:     General: No focal deficit present.     Mental Status: He is alert and oriented to person, place, and time.  Psychiatric:        Mood and Affect: Mood normal.        Behavior: Behavior normal.     BP 136/74 (BP Location: Left Arm, Patient Position: Sitting, Cuff Size: Normal)   Pulse 89   Ht 5\' 7"  (1.702 m)   Wt 130 lb 9.6 oz (59.2 kg)   SpO2 96%   BMI 20.45 kg/m  Wt Readings from Last 3 Encounters:  01/20/23 130 lb 9.6 oz (59.2 kg)  01/18/23 130 lb (59 kg)  12/25/22 130 lb (59 kg)    No results found for: "TSH" Lab Results  Component Value Date   WBC 4.9 12/25/2022   HGB 14.0 12/25/2022   HCT 41.8 12/25/2022   MCV 95.7 12/25/2022   PLT 146 (L) 12/25/2022   Lab Results  Component Value Date   NA 131 (L) 12/25/2022   K 3.7 12/25/2022   CO2 23 12/25/2022   GLUCOSE 153 (H) 12/25/2022   BUN 20 12/25/2022   CREATININE 1.75 (H) 12/25/2022   BILITOT 0.9 12/25/2022   ALKPHOS 61 12/25/2022   AST 25 12/25/2022   ALT 14 12/25/2022   PROT 8.2 (H) 12/25/2022   ALBUMIN 3.9 12/25/2022   CALCIUM 8.8 (L) 12/25/2022   ANIONGAP 13 12/25/2022   EGFR 52 (L) 11/26/2022   Lab Results  Component Value Date   CHOL 108 08/18/2022   Lab Results  Component Value Date   HDL 48 08/18/2022   Lab Results  Component Value Date  LDLCALC 49 08/18/2022   Lab Results  Component Value Date   TRIG 45 08/18/2022   Lab Results  Component Value Date   CHOLHDL 2.3 08/18/2022   Lab Results  Component Value Date   HGBA1C 6.1 08/26/2022      Assessment & Plan:   Problem List Items Addressed This Visit       Endocrine   Type 2 diabetes mellitus with other specified complication (HCC)    Lab Results  Component Value Date   HGBA1C 6.1 08/26/2022  Associated with HTN and  PAD Well-controlled with Lantus 14 units every morning, followed by Dr. Fransico Him Recent hyperglycemia due to oral steroid, increased dose of Lantus to 20 units every morning Advised to follow diabetic diet On statin and ACEi F/u CMP and lipid panel Diabetic eye exam: Advised to follow up with Ophthalmology for diabetic eye exam        Other   Post-COVID chronic cough    Likely benign cough Continue oral prednisone Tessalon as needed for cough      Relevant Medications   benzonatate (TESSALON) 200 MG capsule   Left inguinal hernia - Primary    Left-sided reducible inguinal hernia Has been referred to general surgeon Check CT abdomen pelvis for better assessment of intrapelvic etiology of mass      Relevant Orders   CT Abdomen Pelvis Wo Contrast   Encounter for examination following treatment at hospital    ER chart reviewed including imaging Has post-COVID chronic cough, on oral prednisone now        Meds ordered this encounter  Medications   benzonatate (TESSALON) 200 MG capsule    Sig: Take 1 capsule (200 mg total) by mouth 2 (two) times daily as needed for cough.    Dispense:  30 capsule    Refill:  0    Follow-up: Return if symptoms worsen or fail to improve.    Anabel Halon, MD

## 2023-01-20 NOTE — Assessment & Plan Note (Addendum)
Left-sided reducible inguinal hernia Has been referred to general surgeon Check CT abdomen pelvis for better assessment of intrapelvic etiology of mass

## 2023-01-25 ENCOUNTER — Telehealth: Payer: Self-pay

## 2023-01-25 NOTE — Telephone Encounter (Signed)
Transition Care Management Unsuccessful Follow-up Telephone Call  Date of discharge and from where:  [facility]  Attempts:  1st Attempt  Reason for unsuccessful TCM follow-up call:  Left voice message   [name] [facility] Guide, [facility] [phone] [address] [place], [place], [place] [address] Phone: [phone] Email: [email or web address]

## 2023-01-26 ENCOUNTER — Telehealth: Payer: Self-pay

## 2023-01-26 NOTE — Telephone Encounter (Signed)
Transition Care Management Unsuccessful Follow-up Telephone Call  Date of discharge and from where:  [facility]  Attempts:  2nd Attempt  Reason for unsuccessful TCM follow-up call:  Left voice message   [name] [facility] Guide, [facility] [phone] [address] [place], [place], [place] [address] Phone: [phone] Email: [email or web address]

## 2023-01-28 ENCOUNTER — Ambulatory Visit (HOSPITAL_BASED_OUTPATIENT_CLINIC_OR_DEPARTMENT_OTHER)
Admission: RE | Admit: 2023-01-28 | Discharge: 2023-01-28 | Disposition: A | Discharge status: Home / Self Care | Payer: Medicare Other | Source: Ambulatory Visit | Attending: Internal Medicine | Admitting: Internal Medicine

## 2023-01-28 DIAGNOSIS — K4090 Unilateral inguinal hernia, without obstruction or gangrene, not specified as recurrent: Secondary | ICD-10-CM | POA: Insufficient documentation

## 2023-01-28 DIAGNOSIS — R109 Unspecified abdominal pain: Secondary | ICD-10-CM | POA: Diagnosis not present

## 2023-02-02 ENCOUNTER — Ambulatory Visit (INDEPENDENT_AMBULATORY_CARE_PROVIDER_SITE_OTHER): Payer: Medicare Other | Admitting: General Surgery

## 2023-02-02 ENCOUNTER — Encounter: Payer: Self-pay | Admitting: General Surgery

## 2023-02-02 VITALS — BP 126/74 | HR 58 | Temp 98.1°F | Resp 14 | Ht 67.0 in | Wt 126.0 lb

## 2023-02-02 DIAGNOSIS — R1032 Left lower quadrant pain: Secondary | ICD-10-CM

## 2023-02-02 NOTE — Patient Instructions (Addendum)
No obvious hernia on the CT scan or my exam. You could have a small testicle cord lipoma (extra fat) but there is no obvious hernia. Will review your CT and make sure a third person agrees no hernia.  Call with worsening pain or changes.  You could just have a pulled groin muscle.    Adductor Muscle Strain  An adductor muscle strain, also called a groin strain or pull, is an injury to the muscles or tendons on the upper, inner part of the thigh. These muscles are called the adductor or groin muscles. They are responsible for moving the legs across the body or pulling the legs together. A muscle strain occurs when a muscle is overstretched and some muscle fibers are torn. The severity of an adductor muscle strain is rated as Grade 1, 2, or 3. A Grade 3 strain has the most tearing and pain. What are the causes? Adductor muscle strains usually occur during exercise or while participating in sports. This condition may be caused by: A sudden, violent force placed on the muscle, stretching it too far. Stretching the muscles too far or too suddenly, often during side-to-side motion with a sudden change in direction. Putting repeated stress on the adductor muscles over a long period of time. Performing vigorous activity without properly stretching or warming up the adductor muscles beforehand. Not being properly conditioned. What are the signs or symptoms? Symptoms of this condition include: Pain and tenderness in the groin area. This begins as sharp pain and persists as a dull ache. A popping or snapping feeling when the injury occurs (for severe strains). Swelling or bruising. Muscle spasms. Weakness in the leg. Stiffness in the groin area with decreased ability to move the affected muscles. How is this diagnosed? This condition may be diagnosed based on: A physical exam. Your medical history. How well you can do certain range of motion exercises. Imaging tests, such as MRI, ultrasound, or  X-rays. Your strain may be rated based on how severe it is. The ratings are: Grade 1 strain (mild). Muscles are overstretched. There may be very small muscle tears. This type of strain generally heals in about one week. Grade 2 strain (moderate). Muscles are partially torn. This may take one to two months to heal. Grade 3 strain (severe). Muscles are completely torn. A severe strain can take more than three months to heal. Grade 3 gluteal strains are rare. How is this treated? An adductor strain will often heal on its own. If needed, this condition may be treated with: PRICE therapy. PRICE stands for protection of the injured area, rest, ice, pressure (compression), and elevation. Medicines to help manage pain and swelling (anti-inflammatory medicines). Crutches. You may be directed to use these for the first few days to minimize your pain. Depending on the severity of the muscle strain, recovery time may vary from a few weeks to several months. Severe injuries often require 4-6 weeks for recovery. In those cases, complete healing can take 4-5 months. Follow these instructions at home: PRICE Therapy  Protect the muscle from being injured again. Rest. Do not use the strained muscle if it causes pain. If directed, put ice on the injured area: Put ice in a plastic bag. Place a towel between your skin and the bag. Leave the ice on for 20 minutes, 2-3 times a day. Do this for the first 2 days after the injury. Apply compression by wrapping the injured area with an elastic bandage as told by your health care  provider. Raise (elevate) the injured area above the level of your heart while you are sitting or lying down. Activity Do not drive or use heavy machinery while taking prescription pain medicine. Walk, stretch, and do exercises as told by your health care provider. Only do these activities if you can do so without any pain. General instructions Take over-the-counter and prescription medicines  only as told by your health care provider. Follow your treatment plan as told by your health care provider. This may include: Physical therapy. Massage. Local electrical stimulation (transcutaneous electrical nerve stimulation, TENS). Keep all follow-up visits. This is important. How is this prevented? Warm up and stretch before being active. Cool down and stretch after being active. Give your body time to rest between periods of activity. Make sure to use equipment that fits you. Be safe and responsible while being active to avoid slips and falls. Maintain physical fitness, including: Proper conditioning in the adductor muscles. Overall strength, flexibility, and endurance. Contact a health care provider if: You have increased pain or swelling in the affected area. Your symptoms are not improving or they are getting worse. Summary An adductor muscle strain, also called a groin strain or pull, is an injury to the muscles or tendons on the upper, inner part of the thigh. A muscle strain occurs when a muscle is overstretched and some muscle fibers are torn. Depending on the severity of the muscle strain, recovery time may vary from a few weeks to several months. This information is not intended to replace advice given to you by your health care provider. Make sure you discuss any questions you have with your health care provider. Adductor Muscle Strain With Rehab Ask your health care provider which exercises are safe for you. Do exercises exactly as told by your health care provider and adjust them as directed. It is normal to feel mild stretching, pulling, tightness, or mild discomfort as you do these exercises. Stop right away if you feel sudden pain or your pain gets worse. Do not begin these exercises until told by your health care provider. Strengthening exercises These exercises build strength and endurance in your thighs. Endurance is the ability to use your muscles for a long time,  even after your muscles get tired. Hip adductor isometrics This exercise is sometimes called inner thigh squeeze. Sit on a firm chair that positions your knees at about the same height as your hips. Place a large ball, firm pillow, or rolled-up bath towel between your thighs. Squeeze your thighs together, gradually building tension. Hold for __________ seconds. Release the tension gradually. Allow your inner thigh muscles to relax completely before you start the next repetition. Repeat __________ times. Complete this exercise __________ times a day. Hip adduction This exercise is sometimes called side-lying straight leg raises. Lie on your side so your head, shoulder, knee, and hip are in a straight line with each other. To help maintain your balance, you may put the foot of your top leg in front of the leg that is on the floor. Your left / right leg should be on the bottom. Roll your hips slightly forward so your hips are stacked directly over each other and your left / right knee is facing forward. Tense the muscles of your inner thigh and lift your bottom leg 4-6 inches (10-15 cm). Hold this position for __________ seconds. Slowly lower your leg to the starting position. Allow your muscles to relax completely before you start the next repetition. Repeat __________ times. Complete  this exercise __________ times a day. Hip extension This exercise is sometimes called prone (on your belly) straight leg raises. Lie on your belly on a bed or a firm surface with a pillow under your hips. Tense your buttock muscles and lift your left / right thigh off the bed. Your left / right knee can be bent or straight, but do not let your back arch. Hold this position for __________ seconds. Slowly return to the starting position. Allow your muscles to relax completely before you start the next repetition. Repeat __________ times. Complete this exercise __________ times a day. Balance exercises These  exercises improve or maintain your balance. Balance is important in preventing falls. Single-leg balance  Stand near a railing or by a door frame that you can hold onto as needed. Stand on your left / right foot. Keep your big toe down on the floor and try to keep your arch lifted. If this is too easy, you can stand with your eyes closed or stand on a pillow. Hold this position for __________ seconds. Repeat __________ times. Complete this exercise __________ times a day. Side lunges  Stand with your feet together. Keeping one foot in place, step to the side with your other foot about __________ inches (__________ cm). Do not step so far that you feel discomfort in your middle thigh. Push off from your stepping foot to return to the starting position. Repeat __________ times. Complete this exercise __________ times a day. This information is not intended to replace advice given to you by your health care provider. Make sure you discuss any questions you have with your health care provider. Document Revised: 02/18/2021 Document Reviewed: 02/18/2021 Elsevier Patient Education  2023 [employer].

## 2023-02-02 NOTE — Progress Notes (Signed)
[facility] History and Physical  Reason for Referral: ? Left groin pain possible hernia  Referring Physician: Anabel Halon, MD    Chief Complaint   New Patient (Initial Visit)     Chase Briggs is a 72 y.o. male.  HPI: Mr. Web is a 72 yo who comes in after having COVID and a post COVID cough that has caused him to have some left groin pain. He has been feeling weak and coughing and has been to the ED and to his PCP. He was evaluated in the ED and they said they felt something in his groin and his PCP was also worried about a hernia. He was referred to me but also had a CT between now and when he saw these providers.  CT scan is negative for any inguinal hernia.   Past Medical History:  Diagnosis Date   Diabetes mellitus    Fatty liver    GERD (gastroesophageal reflux disease)    HTN (hypertension)    Hyperlipidemia    Insomnia    Renal insufficiency     Past Surgical History:  Procedure Laterality Date   ABDOMINAL AORTOGRAM W/LOWER EXTREMITY N/A 08/22/2018   Procedure: ABDOMINAL AORTOGRAM W/LOWER EXTREMITY;  Surgeon: [name], MD;  Location: [facility] INVASIVE CV LAB;  Service: Cardiovascular;  Laterality: N/A;   BACK SURGERY     CARPAL TUNNEL WITH CUBITAL TUNNEL Right 03/21/2019   Procedure: RIGHT CARPAL TUNNEL AND CUBITAL  TUNNEL RELEASES;  Surgeon: [name], MD;  Location: [facility];  Service: Orthopedics;  Laterality: Right;  AXILLARY BLOCK   COLONOSCOPY  01/2007   Dr. [name], reviewed report, mild diverticulosis. Prep adequate. Next TCS due 01/2017   ESOPHAGOGASTRODUODENOSCOPY N/A 10/21/2012   [id number] GASTRITIS   MASS EXCISION Right 10/26/2012   Procedure: EXCISION MASS;  Surgeon: [name], MD;  Location: [facility];  Service: General;  Laterality: Right;   PERIPHERAL VASCULAR INTERVENTION  08/22/2018   Procedure: PERIPHERAL VASCULAR INTERVENTION;  Surgeon: [name], MD;  Location: [facility]  INVASIVE CV LAB;  Service: Cardiovascular;;  bilateral external iliac stents   scalp mass  4-5 yrs ago   Dr Angelique Holm NERVE TRANSPOSITION Right 03/21/2019   Procedure: DECOMPRESSION RIGHT ULNAR NERVE;  Surgeon: [name], MD;  Location: [facility];  Service: Orthopedics;  Laterality: Right;    Family History  Problem Relation Age of Onset   Diabetes Mother    Diabetes Father    Diabetes Brother    Colon cancer Neg Hx    Liver disease Neg Hx     Social History   Tobacco Use   Smoking status: Every Day    Packs/day: 0.50    Years: 35.00    Additional pack years: 0.00    Total pack years: 17.50    Types: Cigarettes   Smokeless tobacco: Never  Vaping Use   Vaping Use: Never used  Substance Use Topics   Alcohol use: No    Comment: hx heavy etoh x 3 yrs, quit 30+ yrs ago   Drug use: No    Medications: I have reviewed the patient's current medications. Allergies as of 02/02/2023   No Known Allergies      Medication List        Accurate as of Feb 02, 2023 11:45 AM. If you have any questions, ask your nurse or doctor.          albuterol 108 (90 Base) MCG/ACT inhaler  Commonly known as: VENTOLIN HFA Inhale 2 puffs into the lungs every 6 (six) hours as needed for wheezing or shortness of breath.   amLODipine 10 MG tablet Commonly known as: NORVASC Take 1 tablet (10 mg total) by mouth daily.   aspirin EC 81 MG tablet Take 1 tablet (81 mg total) by mouth daily with breakfast.   atorvastatin 10 MG tablet Commonly known as: LIPITOR Take 1 tablet (10 mg total) by mouth daily.   benzonatate 200 MG capsule Commonly known as: TESSALON Take 1 capsule (200 mg total) by mouth 2 (two) times daily as needed for cough.   cloNIDine 0.1 MG tablet Commonly known as: CATAPRES Take 1 tablet (0.1 mg total) by mouth 2 (two) times daily.   clopidogrel 75 MG tablet Commonly known as: PLAVIX TAKE (1) TABLET BY MOUTH ONCE DAILY.   cyclobenzaprine 10 MG  tablet Commonly known as: FLEXERIL Take 1 tablet (10 mg total) by mouth 2 (two) times daily as needed for muscle spasms.   glucose blood test strip 1 each by Other route 2 (two) times daily. Use as instructed bid E11.65 Relion Premier   hydrochlorothiazide 25 MG tablet Commonly known as: HYDRODIURIL Take 1 tablet (25 mg total) by mouth daily.   Lancets Misc 1 each by Does not apply route 2 (two) times daily. E11.65 Relion Premier   Lantus SoloStar 100 UNIT/ML Solostar Pen Generic drug: insulin glargine INJECT 14 UNITS UNDER THE SKIN AT BEDTIME   lisinopril 40 MG tablet Commonly known as: ZESTRIL Take 1 tablet (40 mg total) by mouth daily.   promethazine-dextromethorphan 6.25-15 MG/5ML syrup Commonly known as: PROMETHAZINE-DM Take 5 mLs by mouth 4 (four) times daily as needed for cough.   Restasis 0.05 % ophthalmic emulsion Generic drug: cycloSPORINE Place 1 drop into both eyes 2 (two) times daily as needed.   Sure Comfort Pen Needles 32G X 4 MM Misc Generic drug: Insulin Pen Needle USE ONCE DAILY   traZODone 100 MG tablet Commonly known as: DESYREL Take 1 tablet (100 mg total) by mouth daily with breakfast. What changed: when to take this   Vitamin D 125 MCG (5000 UT) Caps Take 2,000 Units by mouth daily.         ROS:  A comprehensive review of systems was negative except for: Respiratory: positive for cough and SOB Gastrointestinal: positive for abdominal pain and left lower groin pain Genitourinary: positive for retention Musculoskeletal: positive for back pain, neck pain, and joint pain Neurological: positive for dizziness and numbness Endocrine: positive for tired/ sluggish, diabetic  Blood pressure 126/74, pulse (!) 58, temperature 98.1 F (36.7 C), temperature source Oral, resp. rate 14, height 5\' 7"  (1.702 m), weight 126 lb (57.2 kg), SpO2 96 %. Physical Exam Vitals reviewed.  Constitutional:      Appearance: Normal appearance.  HENT:     Head:  Normocephalic.     Nose: Nose normal.  Eyes:     Extraocular Movements: Extraocular movements intact.  Cardiovascular:     Rate and Rhythm: Normal rate and regular rhythm.  Pulmonary:     Effort: Pulmonary effort is normal.     Breath sounds: Normal breath sounds.  Abdominal:     General: There is no distension.     Palpations: Abdomen is soft.     Tenderness: There is no abdominal tenderness.     Hernia: No hernia is present.  Musculoskeletal:        General: No swelling.     Cervical back: Normal  range of motion.  Skin:    General: Skin is warm.  Neurological:     General: No focal deficit present.     Mental Status: He is alert and oriented to person, place, and time.  Psychiatric:        Mood and Affect: Mood normal.        Behavior: Behavior normal.        Thought Content: Thought content normal.     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 Suspected inguinal hernia.   EXAM: CT ABDOMEN AND PELVIS WITHOUT CONTRAST   TECHNIQUE: Multidetector CT imaging of the abdomen and pelvis was performed following the standard protocol without IV contrast.   RADIATION DOSE REDUCTION: This exam was performed according to the departmental dose-optimization program which includes automated exposure control, adjustment of the mA and/or kV according to patient size and/or use of iterative reconstruction technique.   COMPARISON:  10/25/2021   FINDINGS: Lower chest: No acute findings.   Hepatobiliary: No mass visualized on this unenhanced exam. Focal fatty infiltration again seen adjacent to the falciform ligament. Gallbladder is unremarkable. No evidence of biliary ductal dilatation.   Pancreas: No mass or inflammatory process visualized on this unenhanced exam.   Spleen:  Within normal  limits in size.   Adrenals/Urinary tract: No evidence of urolithiasis or hydronephrosis. Unremarkable unopacified urinary bladder.   Stomach/Bowel: No evidence of obstruction, inflammatory process, or abnormal fluid collections. Normal appendix visualized.   Vascular/Lymphatic: No pathologically enlarged lymph nodes identified. No evidence of abdominal aortic aneurysm. Aortic atherosclerotic calcification incidentally noted.   Reproductive:  No mass or other significant abnormality.   Other:  No evidence of inguinal hernia or mass.   Musculoskeletal:  No suspicious bone lesions identified.   IMPRESSION: No acute findings. No evidence of inguinal hernia other significant abnormality.     Electronically Signed   By: Danae Orleans M.D.   On: 01/30/2023 13:48   Assessment & Plan:  VITALY JASMER is a 72 y.o. male with left groin pain and maybe a cord lipoma but no obvious hernia on that side on CT scan. I discussed that I think he has pulled a muscle or irritated the area with his coughing and this should continue to get better. He does have a lipoma in the area which could account for what the other providers felt but I do not feel a hernia or see one on the CT scan.   -My partner, Dr. Santina Evans Pappayliou, DO reviewed the CT also and she agrees looks like cord lipoma but no hernia noted  -Information on groin pain given to patient and options for exercises to help with groin pain.   All questions were answered to the satisfaction of the patient and family. No surgery indicated at this time.  Gave him return precautions for feeling or seeing a bulge, worsening pain that could develop on either side if he did form a hernia in the future.   PRN Follow up   Lucretia Roers 02/02/2023, 11:45 AM

## 2023-02-10 ENCOUNTER — Ambulatory Visit (INDEPENDENT_AMBULATORY_CARE_PROVIDER_SITE_OTHER): Payer: Medicare Other | Admitting: Internal Medicine

## 2023-02-10 ENCOUNTER — Encounter: Payer: Self-pay | Admitting: Internal Medicine

## 2023-02-10 VITALS — BP 122/68 | HR 95 | Ht 67.0 in | Wt 127.0 lb

## 2023-02-10 DIAGNOSIS — R220 Localized swelling, mass and lump, head: Secondary | ICD-10-CM | POA: Diagnosis not present

## 2023-02-10 DIAGNOSIS — B370 Candidal stomatitis: Secondary | ICD-10-CM | POA: Insufficient documentation

## 2023-02-10 DIAGNOSIS — I10 Essential (primary) hypertension: Secondary | ICD-10-CM | POA: Diagnosis not present

## 2023-02-10 MED ORDER — HYDROCHLOROTHIAZIDE 12.5 MG PO CAPS: 12.5000 mg | ORAL_CAPSULE | Freq: Every day | ORAL | 0 refills | Status: DC

## 2023-02-10 MED ORDER — EPINEPHRINE 0.3 MG/0.3ML IJ SOAJ: 0.3000 mg | INTRAMUSCULAR | 1 refills | Status: AC | PRN

## 2023-02-10 MED ORDER — FLUCONAZOLE 100 MG PO TABS: ORAL_TABLET | ORAL | 0 refills | Status: AC

## 2023-02-10 MED ORDER — CETIRIZINE HCL 10 MG PO TABS: 10.0000 mg | ORAL_TABLET | Freq: Every day | ORAL | 0 refills | Status: DC

## 2023-02-10 NOTE — Assessment & Plan Note (Addendum)
This may be secondary to thrush infection. He is on lisinopril. No shortness of breath.  Prescribed a epinephrine pen and given precautions to seek emergent attention if swelling worsens or he has shortness of breath Prescribed antihistamine to start daily Follow up if no improvement or worsening lower lip swelling

## 2023-02-10 NOTE — Patient Instructions (Addendum)
Thank you, Chase Briggs for allowing [place] to provide your care today.   We are decreasing your HCTZ to 12.5 mg daily. I will send this to your pharmacy.   I am sending medication for thrush to your pharmacy.   I am also sending an epinephrine pen to your pharmacy. I am sending cetirizine for you to take until your lip swelling goes down.    Reminders: Follow up with Dr.Patel on 6/17    Thurmon Fair, M.D.

## 2023-02-10 NOTE — Assessment & Plan Note (Addendum)
Patient has thrush on tongue and lower lip. He has some swelling of lower lip and I am not sure if this is being caused by thrush.  Prescribed oral fluconazole  Follow up if not improving

## 2023-02-10 NOTE — Progress Notes (Signed)
   HPI:Chase Briggs is a 72 y.o. male who presents for evaluation of dizziness. Patient describes dizziness as feeling like he may pass out. He estimates this has been going on for 3 weeks. No changes to medications around this time. He did have Covid infection around the time symptoms started. The dizziness occurs with standing and walking a few steps. Patient states Saturday night his lip started to swell and has not went down. He has never had this before. He did eat some spicy food , but nothing he has not eaten before. No shortness of breath.  For the details of today's visit, please refer to the assessment and plan.  Vitals:   02/10/23 0838  BP: 122/68  Pulse: 95  SpO2: 92%  Weight: 127 lb (57.6 kg)  Height: 5\' 7"  (1.702 m)     Physical Exam HENT:     Mouth/Throat:     Mouth: Oral lesions present.     Pharynx: Uvula midline. No pharyngeal swelling or uvula swelling.     Comments: Swelling of lower lip , with white plaques on tongue and lower lip Cardiovascular:     Rate and Rhythm: Normal rate and regular rhythm.     Heart sounds: No murmur heard. Pulmonary:     Effort: Pulmonary effort is normal. No respiratory distress.     Breath sounds: No wheezing or rales.  Musculoskeletal:     Right lower leg: No edema.     Left lower leg: No edema.      Assessment & Plan:   Chase Briggs was seen today for dizziness.  Thrush Assessment & Plan: Patient has thrush on tongue and lower lip. He has some swelling of lower lip and I am not sure if this is being caused by thrush.  Re Prescribed oral fluconazole  Follow up if not improving   Orders: -     Fluconazole; Take 2 tablets (200 mg total) by mouth daily for 1 day, THEN 1 tablet (100 mg total) daily for 7 days. Take 2 tablets on day 1. Followed b.  Dispense: 9 tablet; Refill: 0  Lip swelling Assessment & Plan: This may be secondary to thrush infection. He is on lisinopril. No shortness of breath.  Prescribed a  epinephrine pen and given precautions to seek emergent attention if swelling worsens or he has shortness of breath Prescribed antihistamine to start daily Follow up if no improvement or worsening lower lip swelling  Orders: -     Cetirizine HCl; Take 1 tablet (10 mg total) by mouth daily.  Dispense: 30 tablet; Refill: 0 -     EPINEPHrine; Inject 0.3 mg into the muscle as needed for anaphylaxis.  Dispense: 1 each; Refill: 1  Essential hypertension Assessment & Plan: Patient BP is 122/68 today.  He continues to have some dizziness.  Reviewed previous note and Dr. Allena Katz had plans for patient to decrease HCTZ to 12.5 mg daily. Patient brought medications and he was still taking HCTZ 25 mg daily.  Continue amlodipine 10 mg daily, lisinopril 40 mg daily, clonidine point 1 mg twice daily Start HCTZ 12.5 mg daily Stop HCTZ 25 mg daily Follow-up already scheduled with Dr. Allena Katz in approximately 2 weeks  Orders: -     hydroCHLOROthiazide; Take 1 capsule (12.5 mg total) by mouth daily.  Dispense: 90 capsule; Refill: 0      Milus Banister, MD

## 2023-02-10 NOTE — Assessment & Plan Note (Addendum)
Patient BP is 122/68 today.  He continues to have some dizziness.  Reviewed previous note and Dr. Allena Katz had plans for patient to decrease HCTZ to 12.5 mg daily. Patient brought medications and he was still taking HCTZ 25 mg daily.  Continue amlodipine 10 mg daily, lisinopril 40 mg daily, clonidine point 1 mg twice daily Start HCTZ 12.5 mg daily Stop HCTZ 25 mg daily Follow-up already scheduled with Dr. Allena Katz in approximately 2 weeks

## 2023-02-25 ENCOUNTER — Ambulatory Visit (INDEPENDENT_AMBULATORY_CARE_PROVIDER_SITE_OTHER): Payer: Medicare Other | Admitting: "Endocrinology

## 2023-02-25 ENCOUNTER — Encounter: Payer: Self-pay | Admitting: "Endocrinology

## 2023-02-25 VITALS — BP 118/64 | HR 80 | Ht 67.0 in | Wt 128.6 lb

## 2023-02-25 DIAGNOSIS — F17200 Nicotine dependence, unspecified, uncomplicated: Secondary | ICD-10-CM | POA: Diagnosis not present

## 2023-02-25 DIAGNOSIS — I10 Essential (primary) hypertension: Secondary | ICD-10-CM | POA: Diagnosis not present

## 2023-02-25 DIAGNOSIS — Z794 Long term (current) use of insulin: Secondary | ICD-10-CM | POA: Diagnosis not present

## 2023-02-25 DIAGNOSIS — E1169 Type 2 diabetes mellitus with other specified complication: Secondary | ICD-10-CM

## 2023-02-25 DIAGNOSIS — E782 Mixed hyperlipidemia: Secondary | ICD-10-CM | POA: Diagnosis not present

## 2023-02-25 LAB — POCT GLYCOSYLATED HEMOGLOBIN (HGB A1C): HbA1c, POC (controlled diabetic range): 6.6 % (ref 0.0–7.0)

## 2023-02-25 MED ORDER — LANTUS SOLOSTAR 100 UNIT/ML ~~LOC~~ SOPN: 14.0000 [IU] | PEN_INJECTOR | Freq: Every day | SUBCUTANEOUS | 0 refills | Status: DC

## 2023-02-25 NOTE — Patient Instructions (Signed)

## 2023-02-25 NOTE — Progress Notes (Signed)
02/25/2023   Endocrinology follow-up note   Subjective:    Patient ID: Chase Briggs, male    DOB: Aug 13, 1951, PCP Anabel Halon, MD   Past Medical History:  Diagnosis Date   Diabetes mellitus    Fatty liver    GERD (gastroesophageal reflux disease)    HTN (hypertension)    Hyperlipidemia    Insomnia    Renal insufficiency    Past Surgical History:  Procedure Laterality Date   ABDOMINAL AORTOGRAM W/LOWER EXTREMITY N/A 08/22/2018   Procedure: ABDOMINAL AORTOGRAM W/LOWER EXTREMITY;  Surgeon: [name], MD;  Location: [facility] INVASIVE CV LAB;  Service: Cardiovascular;  Laterality: N/A;   BACK SURGERY     CARPAL TUNNEL WITH CUBITAL TUNNEL Right 03/21/2019   Procedure: RIGHT CARPAL TUNNEL AND CUBITAL  TUNNEL RELEASES;  Surgeon: [name], MD;  Location: [facility];  Service: Orthopedics;  Laterality: Right;  AXILLARY BLOCK   COLONOSCOPY  01/2007   Dr. [name], reviewed report, mild diverticulosis. Prep adequate. Next TCS due 01/2017   ESOPHAGOGASTRODUODENOSCOPY N/A 10/21/2012   [id number] GASTRITIS   MASS EXCISION Right 10/26/2012   Procedure: EXCISION MASS;  Surgeon: [name], MD;  Location: [facility];  Service: General;  Laterality: Right;   PERIPHERAL VASCULAR INTERVENTION  08/22/2018   Procedure: PERIPHERAL VASCULAR INTERVENTION;  Surgeon: [name], MD;  Location: [facility] INVASIVE CV LAB;  Service: Cardiovascular;;  bilateral external iliac stents   scalp mass  4-5 yrs ago   Dr Angelique Holm NERVE TRANSPOSITION Right 03/21/2019   Procedure: DECOMPRESSION RIGHT ULNAR NERVE;  Surgeon: [name], MD;  Location:  SURGERY CENTER;  Service: Orthopedics;  Laterality: Right;   Social History   Socioeconomic History   Marital status: Married    Spouse name: Not on file   Number of children: 2   Years of education: Not on file   Highest education level: Not on file  Occupational History   Occupation: reitred    Comment:  [employer], heavy lifting  Tobacco Use   Smoking status: Every Day    Packs/day: 0.50    Years: 35.00    Additional pack years: 0.00    Total pack years: 17.50    Types: Cigarettes   Smokeless tobacco: Never  Vaping Use   Vaping Use: Never used  Substance and Sexual Activity   Alcohol use: No    Comment: hx heavy etoh x 3 yrs, quit 30+ yrs ago   Drug use: No   Sexual activity: Yes    Birth control/protection: None  Other Topics Concern   Not on file  Social History Narrative   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 Highest level of education: 12th grade. Married for 12 years. Has 2 sons (33 and 21). Lives with wife.   Social Determinants of Health   Financial Resource Strain: Not on file  Food Insecurity: Not on file  Transportation Needs: Not on file  Physical Activity: Not on file  Stress: Not on file  Social Connections: Not on file   Outpatient Encounter Medications as of 02/25/2023  Medication Sig   albuterol (VENTOLIN HFA) 108 (90 Base) MCG/ACT inhaler Inhale 2 puffs into the lungs every 6 (six) hours as needed for wheezing or shortness of breath.   amLODipine (NORVASC) 10 MG tablet Take 1 tablet (10 mg total) by mouth daily.   aspirin EC 81 MG tablet Take 1 tablet (81 mg total) by mouth daily with breakfast.   atorvastatin (  LIPITOR) 10 MG tablet Take 1 tablet (10 mg total) by mouth daily.   benzonatate (TESSALON) 200 MG capsule Take 1 capsule (200 mg total) by mouth 2 (two) times daily as needed for cough.   cetirizine (ZYRTEC) 10 MG tablet Take 1 tablet (10 mg total) by mouth daily.   Cholecalciferol (VITAMIN D) 125 MCG (5000 UT) CAPS Take 2,000 Units by mouth daily.   cloNIDine (CATAPRES) 0.1 MG tablet Take 1 tablet (0.1 mg total) by mouth 2 (two) times daily.   clopidogrel (PLAVIX) 75 MG tablet TAKE (1) TABLET BY MOUTH ONCE DAILY.   cyclobenzaprine (FLEXERIL) 10 MG tablet Take 1 tablet (10 mg total) by mouth 2 (two) times daily as needed for muscle spasms. (Patient not  taking: Reported on 01/18/2023)   EPINEPHrine 0.3 mg/0.3 mL IJ SOAJ injection Inject 0.3 mg into the muscle as needed for anaphylaxis.   glucose blood test strip 1 each by Other route 2 (two) times daily. Use as instructed bid E11.65 Relion Premier   hydrochlorothiazide (MICROZIDE) 12.5 MG capsule Take 1 capsule (12.5 mg total) by mouth daily.   insulin glargine (LANTUS SOLOSTAR) 100 UNIT/ML Solostar Pen Inject 14 Units into the skin daily with breakfast. INJECT 14 UNITS UNDER THE SKIN AT  BREAKFAST   Lancets MISC 1 each by Does not apply route 2 (two) times daily. E11.65 Relion Premier   lisinopril (ZESTRIL) 40 MG tablet Take 1 tablet (40 mg total) by mouth daily.   promethazine-dextromethorphan (PROMETHAZINE-DM) 6.25-15 MG/5ML syrup Take 5 mLs by mouth 4 (four) times daily as needed for cough. (Patient not taking: Reported on 01/18/2023)   RESTASIS 0.05 % ophthalmic emulsion Place 1 drop into both eyes 2 (two) times daily as needed.   SURE COMFORT PEN NEEDLES 32G X 4 MM MISC USE ONCE DAILY   traZODone (DESYREL) 100 MG tablet Take 1 tablet (100 mg total) by mouth daily with breakfast. (Patient taking differently: Take 100 mg by mouth at bedtime.)   [DISCONTINUED] insulin glargine (LANTUS SOLOSTAR) 100 UNIT/ML Solostar Pen INJECT 14 UNITS UNDER THE SKIN AT BEDTIME (Patient taking differently: daily.)   No facility-administered encounter medications on file as of 02/25/2023.   ALLERGIES: No Known Allergies VACCINATION STATUS: Immunization History  Administered Date(s) Administered   Fluad Quad(high Dose 65+) 08/27/2021, 07/24/2022   Moderna Sars-Covid-2 Vaccination 01/04/2020, 02/06/2020   PNEUMOCOCCAL CONJUGATE-20 07/24/2022    Diabetes He presents for his follow-up diabetic visit. He has type 2 diabetes mellitus. Onset time: He was diagnosed at approximate age of 35 years. His disease course has been stable. There are no hypoglycemic associated symptoms. Pertinent negatives for hypoglycemia  include no confusion, headaches, pallor or seizures. Pertinent negatives for diabetes include no chest pain, no fatigue, no polydipsia, no polyphagia, no polyuria and no weakness. There are no hypoglycemic complications. Symptoms are stable (He recently lost control of his glycemia due to exposure to heavy dose steroids.). Risk factors for coronary artery disease include diabetes mellitus, dyslipidemia, hypertension, male sex, sedentary lifestyle and tobacco exposure. He is compliant with treatment most of the time. His weight is fluctuating minimally. He is following a generally unhealthy diet. When asked about meal planning, he reported none. He has not had a previous visit with a dietitian. His home blood glucose trend is decreasing steadily. His breakfast blood glucose range is generally 110-130 mg/dl. His bedtime blood glucose range is generally 110-130 mg/dl. His overall blood glucose range is 110-130 mg/dl. Chase Briggs presents with controlled glycemic profile both fasting and postprandial.  His point-of-care A1c is 6.6%.  Average blood glucose in his meter and logs is 116-129 for the last 30 days.  He did not document major hypoglycemia.     ) An ACE inhibitor/angiotensin II receptor blocker is being taken. Eye exam is current.  Hypertension This is a chronic problem. The current episode started more than 1 year ago. The problem has been gradually improving since onset. The problem is uncontrolled. Pertinent negatives include no chest pain, headaches, neck pain, palpitations or shortness of breath. Risk factors for coronary artery disease include smoking/tobacco exposure, sedentary lifestyle, dyslipidemia, family history and diabetes mellitus. Past treatments include ACE inhibitors, calcium channel blockers and central alpha agonists. The current treatment provides no improvement. Compliance problems include psychosocial issues.  Hypertensive end-organ damage includes kidney disease. Identifiable causes of  hypertension include chronic renal disease.  Hyperlipidemia This is a chronic problem. The current episode started more than 1 year ago. Exacerbating diseases include chronic renal disease and diabetes. Pertinent negatives include no chest pain, myalgias or shortness of breath. Current antihyperlipidemic treatment includes statins. Compliance problems include psychosocial issues.  Risk factors for coronary artery disease include dyslipidemia, diabetes mellitus, male sex, hypertension and a sedentary lifestyle.     Review of Systems  Constitutional:  Negative for fatigue and unexpected weight change.  HENT:  Negative for dental problem, mouth sores and trouble swallowing.   Eyes:  Negative for visual disturbance.  Respiratory:  Negative for cough, choking, chest tightness, shortness of breath and wheezing.   Cardiovascular:  Negative for chest pain, palpitations and leg swelling.  Gastrointestinal:  Negative for abdominal distention, abdominal pain, constipation, diarrhea, nausea and vomiting.  Endocrine: Negative for polydipsia, polyphagia and polyuria.  Genitourinary:  Negative for dysuria, flank pain, hematuria and urgency.  Musculoskeletal:  Negative for back pain, gait problem, myalgias and neck pain.  Skin:  Negative for pallor, rash and wound.  Neurological:  Negative for seizures, syncope, weakness, numbness and headaches.  Psychiatric/Behavioral: Negative.  Negative for confusion and dysphoric mood.     Objective:    BP 118/64   Pulse 80   Ht 5\' 7"  (1.702 m)   Wt 128 lb 9.6 oz (58.3 kg)   BMI 20.14 kg/m   Wt Readings from Last 3 Encounters:  02/25/23 128 lb 9.6 oz (58.3 kg)  02/10/23 127 lb (57.6 kg)  02/02/23 126 lb (57.2 kg)     Physical Exam- Limited  Constitutional:  Body mass index is 20.14 kg/m. , not in acute distress, normal state of mind  Reluctant affect, not ready to quit smoking  Complete Blood Count (Most recent): Lab Results  Component Value Date    WBC 4.9 12/25/2022   HGB 14.0 12/25/2022   HCT 41.8 12/25/2022   MCV 95.7 12/25/2022   PLT 146 (L) 12/25/2022   Chemistry (most recent): Lab Results  Component Value Date   NA 131 (L) 12/25/2022   K 3.7 12/25/2022   CL 95 (L) 12/25/2022   CO2 23 12/25/2022   BUN 20 12/25/2022   CREATININE 1.75 (H) 12/25/2022   Diabetic Labs (most recent): Lab Results  Component Value Date   HGBA1C 6.6 02/25/2023   HGBA1C 6.1 08/26/2022   HGBA1C 6.1 02/24/2022   MICROALBUR 150 08/23/2020   MICROALBUR 9.9 08/20/2017   Lipid Panel     Component Value Date/Time   CHOL 108 08/18/2022 0803   TRIG 45 08/18/2022 0803   HDL 48 08/18/2022 0803   CHOLHDL 2.3 08/18/2022 0803   CHOLHDL  2.1 04/23/2022 0142   VLDL 4 04/23/2022 0142   LDLCALC 49 08/18/2022 0803   LDLCALC 56 12/12/2019 0905     Assessment & Plan:   1. Type 2 diabetes mellitus with microalbuminuria , with long-term current use of insulin (HCC)  Chase Briggs presents with controlled glycemic profile both fasting and postprandial.  His point-of-care A1c is 6.6%.  Average blood glucose in his meter and logs is 116-129 for the last 30 days.  He did not document major hypoglycemia.   He does not have any hypoglycemic episodes.  He is a chronic heavy smoker, remains at a high risk for more acute and chronic complications of diabetes which include CAD, CVA, CKD, retinopathy, and neuropathy. These are all discussed in detail with the patient.    Recent labs reviewed, stage 3 renal insufficiency. - I have re-counseled the patient on diet management  by adopting a carbohydrate restricted / protein rich  Diet.   - he acknowledges that there is a room for improvement in his food and drink choices. - Suggestion is made for him to avoid simple carbohydrates  from his diet including Cakes, Sweet Desserts, Ice Cream, Soda (diet and regular), Sweet Tea, Candies, Chips, Cookies, Store Bought Juices, Alcohol in Excess of  1-2 drinks a day, Artificial  Sweeteners,  Coffee Creamer, and "Sugar-free" Products, Lemonade. This will help patient to have more stable blood glucose profile and potentially avoid unintended weight gain.  - Patient is advised to stick to a routine mealtimes to eat 3 meals  a day and avoid unnecessary snacks ( to snack only to correct hypoglycemia).  - I have approached patient with the following individualized plan to manage diabetes and patient agrees.  - He has benefited from insulin initiation.   - Based on  his presentation with controlled glycemic profile, he will not need prandial insulin for now.  He is advised to continue Lantus 14 units daily after his breakfast.     He is encouraged and agrees to to continue monitoring blood glucose at least twice a day-daily before breakfast and at bedtime.    - He will call clinic if he registers blood glucose readings less than 70 or greater than 200 mg/dL.  -   He has stage 3 renal insufficiency which has improved over time, will stay off of metformin for now.   If his renal insufficiency continues, he will need consultation with nephrology.    - Patient is not a candidate for  incretin therapy, nor SGLT2 inhibitors. - Patient specific target  for A1c; LDL, HDL, Triglycerides, were discussed in detail.  2) BP/HTN:  -  -His blood pressure is controlled to target. He has 3 medications including losartan, clonidine, and amlodipine.  He continues to smoke heavily.   The patient was counseled on the dangers of tobacco use, and was advised to quit.  Reviewed strategies to maximize success, including removing cigarettes and smoking materials from environment.   3) Lipids/HPL: His most recent lipid panel showed LDL controlled at 44.   He is not on statins for now.    4)  Weight/Diet: His BMI is 20.14-he is not a candidate for weight loss.   - He would benefit from addition of more complex carbohydrates, information provided. CDE consult is requested.  5. Vitamin D  deficiency  - He he is on ongoing supplement with vitamin D3 5000 units daily.    6) Chronic Care/Health Maintenance:  -Patient is on ACEI/ARB and Statin medications and encouraged  to continue to follow up with Ophthalmology, Podiatrist at least yearly or according to recommendations, and advised to  Quit  smoking. I have recommended yearly flu vaccine and pneumonia vaccination at least every 5 years; moderate intensity exercise for up to 150 minutes weekly; and  sleep for at least 7 hours a day. -He is counseled extensively for smoking cessation.  - I advised patient to maintain close follow up with Anabel Halon, MD for primary care needs. - I have advised him to avoid over-the-counter pain medications to avoid additional risk for renal insufficiency.  I spent  25  minutes in the care of the patient today including review of labs from CMP, Lipids, Thyroid Function, Hematology (current and previous including abstractions from other facilities); face-to-face time discussing  his blood glucose readings/logs, discussing hypoglycemia and hyperglycemia episodes and symptoms, medications doses, his options of short and long term treatment based on the latest standards of care / guidelines;  discussion about incorporating lifestyle medicine;  and documenting the encounter. Risk reduction counseling performed per USPSTF guidelines to reduce  cardiovascular risk factors.     Please refer to Patient Instructions for Blood Glucose Monitoring and Insulin/Medications Dosing Guide"  in media tab for additional information. Please  also refer to " Patient Self Inventory" in the Media  tab for reviewed elements of pertinent patient history.  Chase Briggs participated in the discussions, expressed understanding, and voiced agreement with the above plans.  All questions were answered to his satisfaction. he is encouraged to contact clinic should he have any questions or concerns prior to his return  visit.   Follow up plan: -Return in about 6 months (around 08/27/2023) for F/U with Pre-visit Labs, Meter/CGM/Logs, A1c here.  Marquis Lunch, MD Phone: [phone]  Fax: [phone]  This note was partially dictated with voice recognition software. Similar sounding words can be transcribed inadequately or may not  be corrected upon review.  02/25/2023, 10:14 AM

## 2023-03-01 ENCOUNTER — Ambulatory Visit: Payer: Medicare Other | Admitting: Internal Medicine

## 2023-03-08 DIAGNOSIS — L851 Acquired keratosis [keratoderma] palmaris et plantaris: Secondary | ICD-10-CM | POA: Diagnosis not present

## 2023-03-08 DIAGNOSIS — E1151 Type 2 diabetes mellitus with diabetic peripheral angiopathy without gangrene: Secondary | ICD-10-CM | POA: Diagnosis not present

## 2023-03-08 DIAGNOSIS — B351 Tinea unguium: Secondary | ICD-10-CM | POA: Diagnosis not present

## 2023-03-08 DIAGNOSIS — M79674 Pain in right toe(s): Secondary | ICD-10-CM | POA: Diagnosis not present

## 2023-03-08 DIAGNOSIS — M79675 Pain in left toe(s): Secondary | ICD-10-CM | POA: Diagnosis not present

## 2023-03-08 DIAGNOSIS — I10 Essential (primary) hypertension: Secondary | ICD-10-CM | POA: Diagnosis not present

## 2023-03-08 DIAGNOSIS — Z79899 Other long term (current) drug therapy: Secondary | ICD-10-CM | POA: Diagnosis not present

## 2023-03-09 LAB — BASIC METABOLIC PANEL
BUN/Creatinine Ratio: 7 — ABNORMAL LOW (ref 10–24)
BUN: 9 mg/dL (ref 8–27)
CO2: 23 mmol/L (ref 20–29)
Calcium: 9.8 mg/dL (ref 8.6–10.2)
Chloride: 101 mmol/L (ref 96–106)
Creatinine, Ser: 1.28 mg/dL — ABNORMAL HIGH (ref 0.76–1.27)
Glucose: 158 mg/dL — ABNORMAL HIGH (ref 70–99)
Potassium: 4.2 mmol/L (ref 3.5–5.2)
Sodium: 140 mmol/L (ref 134–144)
eGFR: 60 mL/min/{1.73_m2} (ref >59)

## 2023-03-25 ENCOUNTER — Ambulatory Visit: Payer: Medicare Other | Attending: Cardiology | Admitting: Cardiology

## 2023-03-25 ENCOUNTER — Encounter: Payer: Self-pay | Admitting: Cardiology

## 2023-03-25 VITALS — BP 140/75 | HR 82 | Ht 67.0 in | Wt 130.0 lb

## 2023-03-25 DIAGNOSIS — I10 Essential (primary) hypertension: Secondary | ICD-10-CM | POA: Insufficient documentation

## 2023-03-25 DIAGNOSIS — R0789 Other chest pain: Secondary | ICD-10-CM | POA: Insufficient documentation

## 2023-03-25 DIAGNOSIS — I739 Peripheral vascular disease, unspecified: Secondary | ICD-10-CM | POA: Insufficient documentation

## 2023-03-25 NOTE — Progress Notes (Signed)
Clinical Summary Chase Briggs is a 72 y.o.male seen today for follow up of the following medical problems.    1. Chest pain - occasional left sided pain. Dull pain, 1/10 in severity. Sporadic in frequency, usually with upper body activity. Better with rubbing area. Lasts a few minutes. - walks daily x 20 -30 minutes, no exertional symptoms   CAD risk factors: HTN, HL, DM2, PAD, tobacco     -admit 04/2022 with chest pain.  04/2022 CT PE negative for PE  04/2022 nuclear stress: inferior defect likely artifact, cannot completely exclude scar but no current ischemia 04/2022 echo: LVEF 60-65%, no WMAs, grade I dd   - denies any chest pain.      2. PAD - followed by vascular - prior stent to right external iliac, left external iliac - he is on a statin atorva 10mg  daily, LDL was 56 at last check.   08/2022 TC 108 TG 45 HDL 48 LDL 49   3. HTN - complinat with meds  -orthostatic dizziness, pcp had lowered hydrochlorothiazide to 12.5mg  daily -symptoms improved since  change  4.DM2 - followed by Chase Briggs   Past Medical History:  Diagnosis Date   Diabetes mellitus    Fatty liver    GERD (gastroesophageal reflux disease)    HTN (hypertension)    Hyperlipidemia    Insomnia    Renal insufficiency      No Known Allergies   Current Outpatient Medications  Medication Sig Dispense Refill   albuterol (VENTOLIN HFA) 108 (90 Base) MCG/ACT inhaler Inhale 2 puffs into the lungs every 6 (six) hours as needed for wheezing or shortness of breath. 20.1 each 1   amLODipine (NORVASC) 10 MG tablet Take 1 tablet (10 mg total) by mouth daily. 90 tablet 3   aspirin EC 81 MG tablet Take 1 tablet (81 mg total) by mouth daily with breakfast. 30 tablet 11   atorvastatin (LIPITOR) 10 MG tablet Take 1 tablet (10 mg total) by mouth daily. 90 tablet 3   benzonatate (TESSALON) 200 MG capsule Take 1 capsule (200 mg total) by mouth 2 (two) times daily as needed for cough. 30 capsule 0    cetirizine (ZYRTEC) 10 MG tablet Take 1 tablet (10 mg total) by mouth daily. 30 tablet 0   Cholecalciferol (VITAMIN D) 125 MCG (5000 UT) CAPS Take 2,000 Units by mouth daily. 30 capsule 3   cloNIDine (CATAPRES) 0.1 MG tablet Take 1 tablet (0.1 mg total) by mouth 2 (two) times daily. 180 tablet 3   clopidogrel (PLAVIX) 75 MG tablet TAKE (1) TABLET BY MOUTH ONCE DAILY. 90 tablet 3   cyclobenzaprine (FLEXERIL) 10 MG tablet Take 1 tablet (10 mg total) by mouth 2 (two) times daily as needed for muscle spasms. (Patient not taking: Reported on 01/18/2023) 30 tablet 0   EPINEPHrine 0.3 mg/0.3 mL IJ SOAJ injection Inject 0.3 mg into the muscle as needed for anaphylaxis. 1 each 1   glucose blood test strip 1 each by Other route 2 (two) times daily. Use as instructed bid E11.65 Relion Premier 100 each 5   hydrochlorothiazide (MICROZIDE) 12.5 MG capsule Take 1 capsule (12.5 mg total) by mouth daily. 90 capsule 0   insulin glargine (LANTUS SOLOSTAR) 100 UNIT/ML Solostar Pen Inject 14 Units into the skin daily with breakfast. INJECT 14 UNITS UNDER THE SKIN AT  BREAKFAST 15 mL 0   Lancets MISC 1 each by Does not apply route 2 (two) times daily. E11.65 Relion  Premier 100 each 5   lisinopril (ZESTRIL) 40 MG tablet Take 1 tablet (40 mg total) by mouth daily. 30 tablet 6   promethazine-dextromethorphan (PROMETHAZINE-DM) 6.25-15 MG/5ML syrup Take 5 mLs by mouth 4 (four) times daily as needed for cough. (Patient not taking: Reported on 01/18/2023) 118 mL 0   RESTASIS 0.05 % ophthalmic emulsion Place 1 drop into both eyes 2 (two) times daily as needed.     SURE COMFORT PEN NEEDLES 32G X 4 MM MISC USE ONCE DAILY 100 each 3   traZODone (DESYREL) 100 MG tablet Take 1 tablet (100 mg total) by mouth daily with breakfast. (Patient taking differently: Take 100 mg by mouth at bedtime.) 90 tablet 1   No current facility-administered medications for this visit.     Past Surgical History:  Procedure Laterality Date   ABDOMINAL  AORTOGRAM W/LOWER EXTREMITY N/A 08/22/2018   Procedure: ABDOMINAL AORTOGRAM W/LOWER EXTREMITY;  Surgeon: Chase Harman, MD;  Location: [facility] INVASIVE CV LAB;  Service: Cardiovascular;  Laterality: N/A;   BACK SURGERY     CARPAL TUNNEL WITH CUBITAL TUNNEL Right 03/21/2019   Procedure: RIGHT CARPAL TUNNEL AND CUBITAL  TUNNEL RELEASES;  Surgeon: Chase Salt, MD;  Location: [facility];  Service: Orthopedics;  Laterality: Right;  AXILLARY BLOCK   COLONOSCOPY  01/2007   Chase. Lovell Briggs, reviewed report, mild diverticulosis. Prep adequate. Next TCS due 01/2017   ESOPHAGOGASTRODUODENOSCOPY N/A 10/21/2012   HYQ:MVHQIONG GASTRITIS   MASS EXCISION Right 10/26/2012   Procedure: EXCISION MASS;  Surgeon: Chase Heading, MD;  Location: [facility];  Service: General;  Laterality: Right;   PERIPHERAL VASCULAR INTERVENTION  08/22/2018   Procedure: PERIPHERAL VASCULAR INTERVENTION;  Surgeon: Chase Harman, MD;  Location: [facility] INVASIVE CV LAB;  Service: Cardiovascular;;  bilateral external iliac stents   scalp mass  4-5 yrs ago   Chase Chase Briggs NERVE TRANSPOSITION Right 03/21/2019   Procedure: DECOMPRESSION RIGHT ULNAR NERVE;  Surgeon: Chase Salt, MD;  Location: [facility];  Service: Orthopedics;  Laterality: Right;     No Known Allergies    Family History  Problem Relation Age of Onset   Diabetes Mother    Diabetes Father    Diabetes Brother    Colon cancer Neg Hx    Liver disease Neg Hx      Social History Chase Briggs reports that he has been smoking cigarettes. He has a 17.5 pack-year smoking history. He has never used smokeless tobacco. Chase Briggs reports no history of alcohol use.   Review of Systems CONSTITUTIONAL: No weight loss, fever, chills, weakness or fatigue.  HEENT: Eyes: No visual loss, blurred vision, double vision or yellow sclerae.No hearing loss, sneezing, congestion, runny nose or sore throat.  SKIN: No rash or itching.   CARDIOVASCULAR: per hpi RESPIRATORY: No shortness of breath, cough or sputum.  GASTROINTESTINAL: No anorexia, nausea, vomiting or diarrhea. No abdominal pain or blood.  GENITOURINARY: No burning on urination, no polyuria NEUROLOGICAL: No headache, dizziness, syncope, paralysis, ataxia, numbness or tingling in the extremities. No change in bowel or bladder control.  MUSCULOSKELETAL: No muscle, back pain, joint pain or stiffness.  LYMPHATICS: No enlarged nodes. No history of splenectomy.  PSYCHIATRIC: No history of depression or anxiety.  ENDOCRINOLOGIC: No reports of sweating, cold or heat intolerance. No polyuria or polydipsia.  Marland Kitchen   Physical Examination Today's Vitals   03/25/23 0805  BP: (!) 144/68  Pulse: 82  SpO2: 96%  Weight: 130 lb (59 kg)  Height: 5\' 7"  (1.702 m)   Body mass index is 20.36 kg/m.  Gen: resting comfortably, no acute distress HEENT: no scleral icterus, pupils equal round and reactive, no palptable cervical adenopathy,  CV: RRR, no m/rg, no jvd Resp: Clear to auscultation bilaterally GI: abdomen is soft, non-tender, non-distended, normal bowel sounds, no hepatosplenomegaly MSK: extremities are warm, no edema.  Skin: warm, no rash Neuro:  no focal deficits Psych: appropriate affect   Diagnostic Studies  04/2022 nuclear stress Findings are equivocal. The study is low risk.   No ST deviation was noted. Arrhythmias during stress: occasional PACs, rare PVCs. The ECG was negative for ischemia.   LV perfusion is abnormal.  Suspected diaphragmatic attenuation affecting the inferior wall predominantly on rest imaging, cannot completely exclude scar but no large region of ischemia.   Left ventricular function is low normal. Nuclear stress EF: 51 %.   Diaphragmatic attenuation artifact was present. Image quality affected due to significant extracardiac activity near the inferior wall.   Overall low risk study with suspected inferior diaphragmatic attenuation, less  likely scar.  No large ischemic territories and low normal LVEF at 51%.     04/2022 echo IMPRESSIONS     1. Left ventricular ejection fraction, by estimation, is 60 to 65%. The  left ventricle has normal function. The left ventricle has no regional  wall motion abnormalities. Left ventricular diastolic parameters are  consistent with Grade I diastolic  dysfunction (impaired relaxation).   2. Right ventricular systolic function is normal. The right ventricular  size is normal. There is mildly elevated pulmonary artery systolic  pressure.   3. The mitral valve is normal in structure. Trivial mitral valve  regurgitation. No evidence of mitral stenosis.   4. The aortic valve is tricuspid. Aortic valve regurgitation is not  visualized. No aortic stenosis is present.   5. The inferior vena cava is normal in size with greater than 50%  respiratory variability, suggesting right atrial pressure of 3 mmHg.    Assessment and Plan  1. Chest pain - extensive workup as outlined above without evidence of significant cardiac pathology - no recent symptoms, continue to monitor   2. HTN - elevated here but has been at goal at several other provider visits over the last few months, recent decrease in hydrochlorothiazide by pcp due to orhtostatic symptoms that are improved - continue current meds   3. PAD - per vascular, he is on DAPT by vascular and not for cardiac reason. Defer duration to vascular - LDL well controlled, continue statin      Antoine Poche, M.D.

## 2023-03-25 NOTE — Patient Instructions (Signed)
Medication Instructions:  Your physician recommends that you continue on your current medications as directed. Please refer to the Current Medication list given to you today.  *If you need a refill on your cardiac medications before your next appointment, please call your pharmacy*   Lab Work: None If you have labs (blood work) drawn today and your tests are completely normal, you will receive your results only by: MyChart Message (if you have MyChart) OR A paper copy in the mail If you have any lab test that is abnormal or we need to change your treatment, we will call you to review the results.   Testing/Procedures: None   Follow-Up: At [facility], you and your health needs are our priority.  As part of our continuing mission to provide you with exceptional heart care, we have created designated Provider Care Teams.  These Care Teams include your primary Cardiologist (physician) and Advanced Practice Providers (APPs -  Physician Assistants and Nurse Practitioners) who all work together to provide you with the care you need, when you need it.  We recommend signing up for the patient portal called "MyChart".  Sign up information is provided on this After Visit Summary.  MyChart is used to connect with patients for Virtual Visits (Telemedicine).  Patients are able to view lab/test results, encounter notes, upcoming appointments, etc.  Non-urgent messages can be sent to your provider as well.   To learn more about what you can do with MyChart, go to [email or web address].    Your next appointment:   1 year(s)  Provider:   Jonathan Branch, MD    Other Instructions    

## 2023-04-01 ENCOUNTER — Other Ambulatory Visit: Payer: Self-pay | Admitting: Internal Medicine

## 2023-04-01 DIAGNOSIS — J432 Centrilobular emphysema: Secondary | ICD-10-CM

## 2023-04-06 ENCOUNTER — Telehealth: Payer: Self-pay | Admitting: Internal Medicine

## 2023-04-06 ENCOUNTER — Other Ambulatory Visit: Payer: Self-pay

## 2023-04-06 DIAGNOSIS — R220 Localized swelling, mass and lump, head: Secondary | ICD-10-CM

## 2023-04-06 DIAGNOSIS — I10 Essential (primary) hypertension: Secondary | ICD-10-CM

## 2023-04-06 DIAGNOSIS — J432 Centrilobular emphysema: Secondary | ICD-10-CM

## 2023-04-06 DIAGNOSIS — I739 Peripheral vascular disease, unspecified: Secondary | ICD-10-CM

## 2023-04-06 DIAGNOSIS — G4700 Insomnia, unspecified: Secondary | ICD-10-CM

## 2023-04-06 MED ORDER — TRAZODONE HCL 100 MG PO TABS
100.0000 mg | ORAL_TABLET | Freq: Every day | ORAL | 1 refills | Status: AC
Start: 2023-04-06 — End: ?

## 2023-04-06 MED ORDER — CLOPIDOGREL BISULFATE 75 MG PO TABS: ORAL_TABLET | ORAL | 3 refills | Status: DC

## 2023-04-06 MED ORDER — CLONIDINE HCL 0.1 MG PO TABS: 0.1000 mg | ORAL_TABLET | Freq: Two times a day (BID) | ORAL | 3 refills | Status: DC

## 2023-04-06 MED ORDER — HYDROCHLOROTHIAZIDE 12.5 MG PO CAPS
12.5000 mg | ORAL_CAPSULE | Freq: Every day | ORAL | 0 refills | Status: DC
Start: 2023-04-06 — End: 2023-05-25

## 2023-04-06 MED ORDER — ATORVASTATIN CALCIUM 10 MG PO TABS
10.0000 mg | ORAL_TABLET | Freq: Every day | ORAL | 3 refills | Status: DC
Start: 2023-04-06 — End: 2023-08-18

## 2023-04-06 MED ORDER — AMLODIPINE BESYLATE 10 MG PO TABS: 10.0000 mg | ORAL_TABLET | Freq: Every day | ORAL | 3 refills | Status: DC

## 2023-04-06 MED ORDER — ALBUTEROL SULFATE HFA 108 (90 BASE) MCG/ACT IN AERS: 2.0000 | INHALATION_SPRAY | Freq: Four times a day (QID) | RESPIRATORY_TRACT | 0 refills | Status: DC | PRN

## 2023-04-06 MED ORDER — CETIRIZINE HCL 10 MG PO TABS
10.0000 mg | ORAL_TABLET | Freq: Every day | ORAL | 0 refills | Status: DC
Start: 2023-04-06 — End: 2023-07-29

## 2023-04-06 NOTE — Telephone Encounter (Signed)
Refills sent to [facility] 

## 2023-04-06 NOTE — Telephone Encounter (Signed)
FYI, update pharmacy to [facility].  Need all meds refills except the pens and needles.

## 2023-04-07 ENCOUNTER — Other Ambulatory Visit: Payer: Self-pay | Admitting: Internal Medicine

## 2023-04-07 DIAGNOSIS — R220 Localized swelling, mass and lump, head: Secondary | ICD-10-CM

## 2023-04-16 ENCOUNTER — Emergency Department (HOSPITAL_COMMUNITY)
Admission: EM | Admit: 2023-04-16 | Discharge: 2023-04-16 | Disposition: A | Discharge status: Home / Self Care | Payer: Medicare Other | Attending: Emergency Medicine | Admitting: Emergency Medicine

## 2023-04-16 ENCOUNTER — Encounter (HOSPITAL_COMMUNITY): Payer: Self-pay | Admitting: *Deleted

## 2023-04-16 ENCOUNTER — Emergency Department (HOSPITAL_COMMUNITY): Payer: Medicare Other

## 2023-04-16 ENCOUNTER — Other Ambulatory Visit: Payer: Self-pay

## 2023-04-16 DIAGNOSIS — N50811 Right testicular pain: Secondary | ICD-10-CM | POA: Diagnosis not present

## 2023-04-16 DIAGNOSIS — N451 Epididymitis: Secondary | ICD-10-CM | POA: Diagnosis not present

## 2023-04-16 DIAGNOSIS — Z7901 Long term (current) use of anticoagulants: Secondary | ICD-10-CM | POA: Insufficient documentation

## 2023-04-16 DIAGNOSIS — Z7982 Long term (current) use of aspirin: Secondary | ICD-10-CM | POA: Diagnosis not present

## 2023-04-16 DIAGNOSIS — N5089 Other specified disorders of the male genital organs: Secondary | ICD-10-CM | POA: Diagnosis not present

## 2023-04-16 DIAGNOSIS — N433 Hydrocele, unspecified: Secondary | ICD-10-CM | POA: Diagnosis not present

## 2023-04-16 LAB — URINALYSIS, W/ REFLEX TO CULTURE (INFECTION SUSPECTED)
Bilirubin Urine: NEGATIVE
Glucose, UA: NEGATIVE mg/dL
Ketones, ur: NEGATIVE mg/dL
Leukocytes,Ua: NEGATIVE
Nitrite: POSITIVE — AB
Protein, ur: NEGATIVE mg/dL
Specific Gravity, Urine: 1.006 (ref 1.005–1.030)
pH: 7 (ref 5.0–8.0)

## 2023-04-16 MED ORDER — CIPROFLOXACIN HCL 500 MG PO TABS: 500.0000 mg | ORAL_TABLET | Freq: Two times a day (BID) | ORAL | 0 refills | Status: DC

## 2023-04-16 NOTE — ED Provider Notes (Signed)
[name] EMERGENCY DEPARTMENT AT [facility] Provider Note   CSN: [id number] Arrival date & time: 04/16/23  0920     History  Chief Complaint  Patient presents with   Groin Pain    Chase Briggs is a 72 y.o. male.  He has a history of some chronic low back pain, peripheral vascular disease.  He started having some right testicular pain last evening.  Worse with palpation and manipulation.  Pain continues this morning and so came here to get it evaluated.  Denies any trauma no urinary symptoms.  No fever nausea vomiting.  Chronic back issues unchanged.  Not sexually active.  The history is provided by the patient.  Male GU Problem Presenting symptoms: scrotal pain   Context: spontaneously   Relieved by:  None tried Worsened by:  Tactile pressure Ineffective treatments:  Rest Associated symptoms: no abdominal pain, no fever, no groin pain, no nausea, no scrotal swelling, no urinary incontinence and no vomiting   Risk factors: not currently sexually active        Home Medications Prior to Admission medications   Medication Sig Start Date End Date Taking? Authorizing Provider  albuterol (VENTOLIN HFA) 108 (90 Base) MCG/ACT inhaler Inhale 2 puffs into the lungs every 6 (six) hours as needed for wheezing or shortness of breath. 04/06/23   Anabel Halon, MD  amLODipine (NORVASC) 10 MG tablet Take 1 tablet (10 mg total) by mouth daily. 04/06/23   Anabel Halon, MD  aspirin EC 81 MG tablet Take 1 tablet (81 mg total) by mouth daily with breakfast. 04/23/22   Shon Hale, MD  atorvastatin (LIPITOR) 10 MG tablet Take 1 tablet (10 mg total) by mouth daily. 04/06/23   Anabel Halon, MD  benzonatate (TESSALON) 200 MG capsule Take 1 capsule (200 mg total) by mouth 2 (two) times daily as needed for cough. 01/20/23   Anabel Halon, MD  cetirizine (ZYRTEC) 10 MG tablet Take 1 tablet (10 mg total) by mouth daily. 04/06/23   Anabel Halon, MD  Cholecalciferol (VITAMIN D)  125 MCG (5000 UT) CAPS Take 2,000 Units by mouth daily. 04/23/22   Shon Hale, MD  cloNIDine (CATAPRES) 0.1 MG tablet Take 1 tablet (0.1 mg total) by mouth 2 (two) times daily. 04/06/23   Anabel Halon, MD  clopidogrel (PLAVIX) 75 MG tablet TAKE (1) TABLET BY MOUTH ONCE DAILY. 04/06/23   Anabel Halon, MD  cyclobenzaprine (FLEXERIL) 10 MG tablet Take 1 tablet (10 mg total) by mouth 2 (two) times daily as needed for muscle spasms. Patient not taking: Reported on 01/18/2023 09/15/22   Anabel Halon, MD  EPINEPHrine 0.3 mg/0.3 mL IJ SOAJ injection Inject 0.3 mg into the muscle as needed for anaphylaxis. 02/10/23   Gardenia Phlegm, MD  glucose blood test strip 1 each by Other route 2 (two) times daily. Use as instructed bid E11.65 Relion Premier 05/15/19   Roma Kayser, MD  hydrochlorothiazide (MICROZIDE) 12.5 MG capsule Take 1 capsule (12.5 mg total) by mouth daily. 04/06/23   Anabel Halon, MD  insulin glargine (LANTUS SOLOSTAR) 100 UNIT/ML Solostar Pen Inject 14 Units into the skin daily with breakfast. INJECT 14 UNITS UNDER THE SKIN AT  [facility] 02/25/23   Roma Kayser, MD  Lancets MISC 1 each by Does not apply route 2 (two) times daily. E11.65 Relion Premier 05/15/19   Roma Kayser, MD  lisinopril (ZESTRIL) 40 MG tablet Take 1 tablet (40 mg  total) by mouth daily. 09/21/22   Antoine Poche, MD  promethazine-dextromethorphan (PROMETHAZINE-DM) 6.25-15 MG/5ML syrup Take 5 mLs by mouth 4 (four) times daily as needed for cough. Patient not taking: Reported on 01/18/2023 12/30/22   Anabel Halon, MD  RESTASIS 0.05 % ophthalmic emulsion Place 1 drop into both eyes 2 (two) times daily as needed. 10/02/21   [provider]  SURE COMFORT PEN NEEDLES 32G X 4 MM MISC USE ONCE DAILY 10/30/22   Roma Kayser, MD  traZODone (DESYREL) 100 MG tablet Take 1 tablet (100 mg total) by mouth daily with breakfast. 04/06/23   Anabel Halon, MD      Allergies    Patient has no  known allergies.    Review of Systems   Review of Systems  Constitutional:  Negative for fever.  Gastrointestinal:  Negative for abdominal pain, nausea and vomiting.  Genitourinary:  Positive for testicular pain. Negative for bladder incontinence and scrotal swelling.  Musculoskeletal:  Positive for back pain (chronic).    Physical Exam Updated Vital Signs BP (!) 152/67 (BP Location: Right Arm)   Pulse 84   Temp 97.8 F (36.6 C) (Oral)   Resp 16   Ht 5\' 7"  (1.702 m)   Wt 59 kg   SpO2 92%   BMI 20.36 kg/m  Physical Exam Vitals and nursing note reviewed.  Constitutional:      General: He is not in acute distress.    Appearance: Normal appearance. He is well-developed.  HENT:     Head: Normocephalic and atraumatic.  Eyes:     Conjunctiva/sclera: Conjunctivae normal.  Cardiovascular:     Rate and Rhythm: Normal rate and regular rhythm.     Heart sounds: No murmur heard. Pulmonary:     Effort: Pulmonary effort is normal. No respiratory distress.     Breath sounds: Normal breath sounds.  Abdominal:     Palpations: Abdomen is soft.     Tenderness: There is no abdominal tenderness. There is no guarding or rebound.  Genitourinary:    Penis: Normal.      Comments: Testicular exam.  Nontender left testicle normal size.  Right testicle tender normal size.  No masses.  No evidence of hernia on the right.  No skin lesions appreciated Musculoskeletal:        General: No swelling.     Cervical back: Neck supple.  Skin:    General: Skin is warm and dry.     Capillary Refill: Capillary refill takes less than 2 seconds.  Neurological:     General: No focal deficit present.     Mental Status: He is alert.     Gait: Gait normal.     ED Results / Procedures / Treatments   Labs (all labs ordered are listed, but only abnormal results are displayed) Labs Reviewed  URINALYSIS, W/ REFLEX TO CULTURE (INFECTION SUSPECTED) - Abnormal; Notable for the following components:      Result  Value   Hgb urine dipstick SMALL (*)    Nitrite POSITIVE (*)    Bacteria, UA FEW (*)    All other components within normal limits    EKG None  Radiology US SCROTUM W/DOPPLER  Result Date: 04/16/2023 CLINICAL DATA:  Right-sided testicular pain for a day EXAM: SCROTAL ULTRASOUND DOPPLER ULTRASOUND OF THE TESTICLES TECHNIQUE: Complete ultrasound examination of the testicles, epididymis, and other scrotal structures was performed. Color and spectral Doppler ultrasound were also utilized to evaluate blood flow to the testicles. COMPARISON:  None Available. FINDINGS: Right testicle Measurements: 3.3 x 1.9 x 2.3 cm. No mass or microlithiasis visualized. Small calcification, echogenic shadowing area along the margin of the right testicle, possible scrotal pearl. Left testicle Measurements: 3.2 x 2.0 x 2.7 cm. No mass or microlithiasis visualized. Right epididymis: Slightly enlarged compared to left and more heterogeneous with more flow on color Doppler. Left epididymis:  Normal in size and appearance. Hydrocele:  Small Varicocele:  None visualized. Pulsed Doppler interrogation of both testes demonstrates increased blood flow to the right testicle compared to left. IMPRESSION: Heterogeneous right epididymis which is enlarged with increased blood flow to the right testicle and epididymis. Please correlate for epididymal orchitis. Small hydroceles. Electronically Signed   By: Karen Kays M.D.   On: 04/16/2023 11:23    Procedures Procedures    Medications Ordered in ED Medications - No data to display  ED Course/ Medical Decision Making/ A&P                                 Medical Decision Making Amount and/or Complexity of Data Reviewed Radiology: ordered.  Risk Prescription drug management.   This patient complains of right testicular pain; this involves an extensive number of treatment Options and is a complaint that carries with it a high risk of complications and morbidity. The  differential includes epididymitis, orchitis, torsion, renal colic, inguinal hernia  I ordered, reviewed and interpreted labs, which included urinalysis without clear signs of infection I ordered imaging studies which included scrotal ultrasound and I independently    visualized and interpreted imaging which showed increased flow to epididymis and testes Previous records obtained and reviewed in epic no recent admissions Social determinants considered, tobacco use Critical Interventions: None  After the interventions stated above, I reevaluated the patient and found patient to be well-appearing in no distress Admission and further testing considered, no indications for admission.  Will cover with antibiotics and recommended outpatient follow-up with urology and PCP.  Return instructions discussed         Final Clinical Impression(s) / ED Diagnoses Final diagnoses:  Epididymitis, right    Rx / DC Orders ED Discharge Orders     None         Terrilee Files, MD 04/16/23 1712

## 2023-04-16 NOTE — ED Triage Notes (Signed)
Pt c/o right testicle pain that started yesterday; pt states he was diagnosed with a hernia to left side last year  Pt denies any swelling or redness, just discomfort

## 2023-04-16 NOTE — Discharge Instructions (Addendum)
You were seen in the emergency department for right-sided testicular pain.  You had an ultrasound done that showed some inflammation around your epididymis which is likely the cause of your pain.  This is usually treated with antibiotics.  You can also take Tylenol and ibuprofen for pain.  You can use ice to the affected area.  Follow-up with your primary care doctor.  Return to the emergency department if any worsening or concerning symptoms.

## 2023-04-23 ENCOUNTER — Telehealth: Payer: Self-pay

## 2023-04-23 NOTE — Telephone Encounter (Signed)
Transition Care Management Unsuccessful Follow-up Telephone Call  Date of discharge and from where:  Chase Briggs 8/2 Attempts:  1st Attempt  Reason for unsuccessful TCM follow-up call:  No answer/busy   [name] [facility] Guide, [facility] [phone] [address] [place], [place], [place] [address] Phone: [phone] Email: [email or web address]

## 2023-04-23 NOTE — Telephone Encounter (Signed)
Transition Care Management Unsuccessful Follow-up Telephone Call  Date of discharge and from where:  Chase Briggs 8/2  Attempts:  2nd Attempt  Reason for unsuccessful TCM follow-up call:  No answer/busy   [name] [facility] Guide, [facility] [phone] [address] [place], [place], [place] [address] Phone: [phone] Email: [name]

## 2023-04-27 ENCOUNTER — Ambulatory Visit (INDEPENDENT_AMBULATORY_CARE_PROVIDER_SITE_OTHER): Payer: Medicare Other | Admitting: Internal Medicine

## 2023-04-27 ENCOUNTER — Encounter: Payer: Self-pay | Admitting: Internal Medicine

## 2023-04-27 VITALS — BP 148/68 | HR 68 | Ht 67.0 in | Wt 129.6 lb

## 2023-04-27 DIAGNOSIS — N453 Epididymo-orchitis: Secondary | ICD-10-CM | POA: Diagnosis not present

## 2023-04-27 DIAGNOSIS — E1169 Type 2 diabetes mellitus with other specified complication: Secondary | ICD-10-CM

## 2023-04-27 DIAGNOSIS — I10 Essential (primary) hypertension: Secondary | ICD-10-CM

## 2023-04-27 DIAGNOSIS — G4700 Insomnia, unspecified: Secondary | ICD-10-CM | POA: Diagnosis not present

## 2023-04-27 DIAGNOSIS — F17200 Nicotine dependence, unspecified, uncomplicated: Secondary | ICD-10-CM | POA: Diagnosis not present

## 2023-04-27 DIAGNOSIS — M5431 Sciatica, right side: Secondary | ICD-10-CM

## 2023-04-27 DIAGNOSIS — Z794 Long term (current) use of insulin: Secondary | ICD-10-CM | POA: Diagnosis not present

## 2023-04-27 DIAGNOSIS — Z09 Encounter for follow-up examination after completed treatment for conditions other than malignant neoplasm: Secondary | ICD-10-CM | POA: Diagnosis not present

## 2023-04-27 DIAGNOSIS — M5432 Sciatica, left side: Secondary | ICD-10-CM | POA: Diagnosis not present

## 2023-04-27 NOTE — Assessment & Plan Note (Signed)
Lab Results  Component Value Date   HGBA1C 6.6 02/25/2023   Associated with HTN and PAD Well-controlled with Lantus 14 units every morning, followed by Dr. Emeline Darling to follow diabetic diet On statin and ACEi F/u CMP and lipid panel Diabetic eye exam: Advised to follow up with Ophthalmology for diabetic eye exam

## 2023-04-27 NOTE — Assessment & Plan Note (Signed)
Back pain with radiating to bilateral LE SLR positive on right side Tylenol PRN Avoid oral NSAIDs for now as he is on DAPT Simple exercises advised, material provided

## 2023-04-27 NOTE — Patient Instructions (Addendum)
Please take Ciprofloxacin for additional 4 days.  Use scrotal support for pain/discomfort.  Please continue to take medications as prescribed.  Please continue to follow low salt diet and perform moderate exercise/walking at least 150 mins/week.

## 2023-04-27 NOTE — Progress Notes (Signed)
Established Patient Office Visit  Subjective:  Patient ID: Chase Briggs, male    DOB: 1951-04-22  Age: 72 y.o. MRN: [id number]  CC:  Chief Complaint  Patient presents with   Follow-up    ER follow up     HPI Chase Briggs is a 72 y.o. male with past medical history of HTN, PAD, allergic rhinitis, type II DM, HLD, insomnia, anemia and tobacco abuse who presents for f/u of his chronic medical conditions and follow-up after recent ER visit for right scrotal pain.    He was diagnosed with acute epididymitis and was given ciprofloxacin.  He has noticed improvement in scrotal pain since taking ciprofloxacin for 7 days.  He reports he still has more pills left of ciprofloxacin and agrees to take it for additional 4 days.  Denies any fever, chills, dysuria, hematuria, urethral discharge or perineal rash.  HTN: BP is elevated today, but reports that his blood pressure remains well controlled at home. Takes medications regularly.  His dose of HCTZ was decreased to 12.5 mg from 25 mg as he was having dizziness from orthostatic hypotension.  He is still taking lisinopril 40 mg QD, amlodipine 10 mg QD, HCTZ 12.5 mg QD and clonidine 0.1 mg BID. Patient denies headache, chest pain, or palpitations.  He follows up with cardiology as well.  PAD: Has had right and left iliac stent placement, followed by vascular surgery.  He takes aspirin, Plavix and statin.  Denies any recent worsening of claudication symptoms.   Type II DM with HLD: He takes Lantus 14 units in the morning, followed by Dr. Fransico Him.  Denies any fatigue, polyuria or polydipsia.  He takes Lipitor for HLD. Denies any fever, chills, chronic cough, hemoptysis, or night sweats.  He takes trazodone 100 mg for insomnia.  He reports difficulty maintaining sleep, and wakes up after 4-5 hours of sleep, but has noticed improvement since increasing dose of trazodone.  He also complains of daytime sleepiness.  Does not report snoring. Denies  any anhedonia, SI or HI currently.  He smokes about 1 cigarettes/day, but used to smoke 1.5 pack/day x15 years in the past.     Past Medical History:  Diagnosis Date   Diabetes mellitus    Fatty liver    GERD (gastroesophageal reflux disease)    HTN (hypertension)    Hyperlipidemia    Insomnia    Renal insufficiency     Past Surgical History:  Procedure Laterality Date   ABDOMINAL AORTOGRAM W/LOWER EXTREMITY N/A 08/22/2018   Procedure: ABDOMINAL AORTOGRAM W/LOWER EXTREMITY;  Surgeon: [name], MD;  Location: [facility] INVASIVE CV LAB;  Service: Cardiovascular;  Laterality: N/A;   BACK SURGERY     CARPAL TUNNEL WITH CUBITAL TUNNEL Right 03/21/2019   Procedure: RIGHT CARPAL TUNNEL AND CUBITAL  TUNNEL RELEASES;  Surgeon: [name], MD;  Location: [facility];  Service: Orthopedics;  Laterality: Right;  AXILLARY BLOCK   COLONOSCOPY  01/2007   Dr. [name], reviewed report, mild diverticulosis. Prep adequate. Next TCS due 01/2017   ESOPHAGOGASTRODUODENOSCOPY N/A 10/21/2012   RKY:[id number] GASTRITIS   MASS EXCISION Right 10/26/2012   Procedure: EXCISION MASS;  Surgeon: [name], MD;  Location: [facility];  Service: General;  Laterality: Right;   PERIPHERAL VASCULAR INTERVENTION  08/22/2018   Procedure: PERIPHERAL VASCULAR INTERVENTION;  Surgeon: [name], MD;  Location: [facility] INVASIVE CV LAB;  Service: Cardiovascular;;  bilateral external iliac stents   scalp mass  4-5 yrs ago  Dr Angelique Holm NERVE TRANSPOSITION Right 03/21/2019   Procedure: DECOMPRESSION RIGHT ULNAR NERVE;  Surgeon: [name], MD;  Location: [facility];  Service: Orthopedics;  Laterality: Right;    Family History  Problem Relation Age of Onset   Diabetes Mother    Diabetes Father    Diabetes Brother    Colon cancer Neg Hx    Liver disease Neg Hx     Social History   Socioeconomic History   Marital status: Married    Spouse name: Not on file   Number of  children: 2   Years of education: Not on file   Highest education level: Not on file  Occupational History   Occupation: reitred    Comment: [employer], heavy lifting  Tobacco Use   Smoking status: Every Day    Current packs/day: 0.50    Average packs/day: 0.5 packs/day for 35.0 years (17.5 ttl pk-yrs)    Types: Cigarettes   Smokeless tobacco: Never  Vaping Use   Vaping status: Never Used  Substance and Sexual Activity   Alcohol use: No    Comment: hx heavy etoh x 3 yrs, quit 30+ yrs ago   Drug use: No   Sexual activity: Yes    Birth control/protection: None  Other Topics Concern   Not on file  Social History Narrative   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 Highest level of education: 12th grade. Married for 12 years. Has 2 sons (33 and 21). Lives with wife.   Social Determinants of Health   Financial Resource Strain: Not on file  Food Insecurity: Not on file  Transportation Needs: Not on file  Physical Activity: Not on file  Stress: Not on file  Social Connections: Not on file  Intimate Partner Violence: Not on file    Outpatient Medications Prior to Visit  Medication Sig Dispense Refill   albuterol (VENTOLIN HFA) 108 (90 Base) MCG/ACT inhaler Inhale 2 puffs into the lungs every 6 (six) hours as needed for wheezing or shortness of breath. 20.1 g 0   amLODipine (NORVASC) 10 MG tablet Take 1 tablet (10 mg total) by mouth daily. 90 tablet 3   aspirin EC 81 MG tablet Take 1 tablet (81 mg total) by mouth daily with breakfast. 30 tablet 11   atorvastatin (LIPITOR) 10 MG tablet Take 1 tablet (10 mg total) by mouth daily. 90 tablet 3   benzonatate (TESSALON) 200 MG capsule Take 1 capsule (200 mg total) by mouth 2 (two) times daily as needed for cough. 30 capsule 0   cetirizine (ZYRTEC) 10 MG tablet Take 1 tablet (10 mg total) by mouth daily. 30 tablet 0   Cholecalciferol (VITAMIN D) 125 MCG (5000 UT) CAPS Take 2,000 Units by mouth daily. 30 capsule 3   ciprofloxacin (CIPRO) 500 MG  tablet Take 1 tablet (500 mg total) by mouth 2 (two) times daily. 14 tablet 0   cloNIDine (CATAPRES) 0.1 MG tablet Take 1 tablet (0.1 mg total) by mouth 2 (two) times daily. 180 tablet 3   clopidogrel (PLAVIX) 75 MG tablet TAKE (1) TABLET BY MOUTH ONCE DAILY. 90 tablet 3   cyclobenzaprine (FLEXERIL) 10 MG tablet Take 1 tablet (10 mg total) by mouth 2 (two) times daily as needed for muscle spasms. (Patient not taking: Reported on 01/18/2023) 30 tablet 0   EPINEPHrine 0.3 mg/0.3 mL IJ SOAJ injection Inject 0.3 mg into the muscle as needed for anaphylaxis. 1 each 1   glucose blood test strip  1 each by Other route 2 (two) times daily. Use as instructed bid E11.65 Relion Premier 100 each 5   hydrochlorothiazide (MICROZIDE) 12.5 MG capsule Take 1 capsule (12.5 mg total) by mouth daily. 90 capsule 0   insulin glargine (LANTUS SOLOSTAR) 100 UNIT/ML Solostar Pen Inject 14 Units into the skin daily with breakfast. INJECT 14 UNITS UNDER THE SKIN AT  BREAKFAST 15 mL 0   Lancets MISC 1 each by Does not apply route 2 (two) times daily. E11.65 Relion Premier 100 each 5   lisinopril (ZESTRIL) 40 MG tablet Take 1 tablet (40 mg total) by mouth daily. 30 tablet 6   promethazine-dextromethorphan (PROMETHAZINE-DM) 6.25-15 MG/5ML syrup Take 5 mLs by mouth 4 (four) times daily as needed for cough. (Patient not taking: Reported on 01/18/2023) 118 mL 0   RESTASIS 0.05 % ophthalmic emulsion Place 1 drop into both eyes 2 (two) times daily as needed.     SURE COMFORT PEN NEEDLES 32G X 4 MM MISC USE ONCE DAILY 100 each 3   traZODone (DESYREL) 100 MG tablet Take 1 tablet (100 mg total) by mouth daily with breakfast. 90 tablet 1   No facility-administered medications prior to visit.    No Known Allergies  ROS Review of Systems  Constitutional:  Negative for chills and fever.  HENT:  Negative for congestion, postnasal drip and sore throat.   Respiratory:  Negative for cough and shortness of breath.   Cardiovascular:  Negative  for chest pain and palpitations.  Gastrointestinal:  Positive for constipation. Negative for diarrhea and vomiting.  Genitourinary:  Negative for dysuria and hematuria.  Musculoskeletal:  Positive for back pain. Negative for neck pain and neck stiffness.  Skin:  Negative for rash.  Neurological:  Negative for dizziness and weakness.  Psychiatric/Behavioral:  Positive for sleep disturbance. Negative for agitation and behavioral problems.       Objective:    Physical Exam Vitals reviewed.  Constitutional:      General: He is not in acute distress.    Appearance: He is not diaphoretic.  HENT:     Head: Normocephalic and atraumatic.     Nose: No congestion.     Mouth/Throat:     Mouth: Mucous membranes are moist.  Eyes:     General: No scleral icterus.    Extraocular Movements: Extraocular movements intact.  Cardiovascular:     Rate and Rhythm: Normal rate and regular rhythm.     Pulses: Normal pulses.     Heart sounds: Normal heart sounds. No murmur heard. Pulmonary:     Breath sounds: Normal breath sounds. No wheezing or rales.  Musculoskeletal:     Cervical back: Neck supple. No tenderness.     Right lower leg: No edema.     Left lower leg: No edema.  Skin:    General: Skin is warm.     Findings: No rash.     Comments: Clubbing of fingernails  Neurological:     General: No focal deficit present.     Mental Status: He is alert and oriented to person, place, and time.  Psychiatric:        Mood and Affect: Mood normal.        Behavior: Behavior normal.     BP (!) 148/68 (BP Location: Right Arm)   Pulse 68   Ht 5\' 7"  (1.702 m)   Wt 129 lb 9.6 oz (58.8 kg)   SpO2 94%   BMI 20.30 kg/m  Wt Readings from Last 3  Encounters:  04/27/23 129 lb 9.6 oz (58.8 kg)  04/16/23 130 lb (59 kg)  03/25/23 130 lb (59 kg)    No results found for: "TSH" Lab Results  Component Value Date   WBC 4.9 12/25/2022   HGB 14.0 12/25/2022   HCT 41.8 12/25/2022   MCV 95.7 12/25/2022    PLT 146 (L) 12/25/2022   Lab Results  Component Value Date   NA 140 03/08/2023   K 4.2 03/08/2023   CO2 23 03/08/2023   GLUCOSE 158 (H) 03/08/2023   BUN 9 03/08/2023   CREATININE 1.28 (H) 03/08/2023   BILITOT 0.9 12/25/2022   ALKPHOS 61 12/25/2022   AST 25 12/25/2022   ALT 14 12/25/2022   PROT 8.2 (H) 12/25/2022   ALBUMIN 3.9 12/25/2022   CALCIUM 9.8 03/08/2023   ANIONGAP 13 12/25/2022   EGFR 60 03/08/2023   Lab Results  Component Value Date   CHOL 108 08/18/2022   Lab Results  Component Value Date   HDL 48 08/18/2022   Lab Results  Component Value Date   LDLCALC 49 08/18/2022   Lab Results  Component Value Date   TRIG 45 08/18/2022   Lab Results  Component Value Date   CHOLHDL 2.3 08/18/2022   Lab Results  Component Value Date   HGBA1C 6.6 02/25/2023      Assessment & Plan:   Problem List Items Addressed This Visit       Cardiovascular and Mediastinum   Essential hypertension    BP Readings from Last 1 Encounters:  04/27/23 (!) 148/68   Elevated today, but usually WNL Usually well-controlled with amlodipine 10 mg QD, lisinopril 40 mg QD, HCTZ 12.5 mg QD and clonidine 0.1 mg BID Since he had dizziness, decreased dose of HCTZ to 12.5 mg QD in the last visit If persistently elevated BP, can add hydralazine Followed by cardiology Counseled for compliance with the medications Advised DASH diet and moderate exercise/walking, at least 150 mins/week        Endocrine   Type 2 diabetes mellitus with other specified complication (HCC)    Lab Results  Component Value Date   HGBA1C 6.6 02/25/2023   Associated with HTN and PAD Well-controlled with Lantus 14 units every morning, followed by Dr. Fransico Him Advised to follow diabetic diet On statin and ACEi F/u CMP and lipid panel Diabetic eye exam: Advised to follow up with Ophthalmology for diabetic eye exam        Nervous and Auditory   Bilateral sciatica    Back pain with radiating to bilateral LE SLR  positive on right side Tylenol PRN Avoid oral NSAIDs for now as he is on DAPT Simple exercises advised, material provided        Genitourinary   Epididymo-orchitis - Primary    Recent ER visit for right scrotal pain Continue Ciprofloxacin for total of 10 days Advised to use scrotal support        Other   Insomnia    Takes trazodone 100 mg nightly, still has difficulty maintaining sleep, but has noticed improvement in insomnia and daytime sleepiness Sleep hygiene material provided      Current smoker    Smokes about 1 cigarettes per day, has cut down from 1.5 pack/day  Asked about quitting: confirms that he currently smokes cigarettes Advise to quit smoking: Educated about QUITTING to reduce the risk of cancer, cardio and cerebrovascular disease. Assess willingness: Unwilling to quit at this time, but is working on cutting back. Assist with counseling  and pharmacotherapy: Counseled for 5 minutes and literature provided. Arrange for follow up:  Follow up in 3 months and continue to offer help.      Encounter for examination following treatment at hospital    ER chart reviewed including imaging Continue Ciprofloxacin for total of 10 days       No orders of the defined types were placed in this encounter.   Follow-up: Return if symptoms worsen or fail to improve.    Anabel Halon, MD

## 2023-04-27 NOTE — Assessment & Plan Note (Signed)
Recent ER visit for right scrotal pain Continue Ciprofloxacin for total of 10 days Advised to use scrotal support

## 2023-04-27 NOTE — Assessment & Plan Note (Signed)
Takes trazodone 100 mg nightly, still has difficulty maintaining sleep, but has noticed improvement in insomnia and daytime sleepiness Sleep hygiene material provided

## 2023-04-27 NOTE — Assessment & Plan Note (Signed)
ER chart reviewed including imaging Continue Ciprofloxacin for total of 10 days

## 2023-04-27 NOTE — Assessment & Plan Note (Signed)
Smokes about 1 cigarettes per day, has cut down from 1.5 pack/day  Asked about quitting: confirms that he currently smokes cigarettes Advise to quit smoking: Educated about QUITTING to reduce the risk of cancer, cardio and cerebrovascular disease. Assess willingness: Unwilling to quit at this time, but is working on cutting back. Assist with counseling and pharmacotherapy: Counseled for 5 minutes and literature provided. Arrange for follow up:  Follow up in 3 months and continue to offer help.

## 2023-04-27 NOTE — Assessment & Plan Note (Addendum)
BP Readings from Last 1 Encounters:  04/27/23 (!) 148/68   Elevated today, but usually WNL Usually well-controlled with amlodipine 10 mg QD, lisinopril 40 mg QD, HCTZ 12.5 mg QD and clonidine 0.1 mg BID Since he had dizziness, decreased dose of HCTZ to 12.5 mg QD in the last visit If persistently elevated BP, can add hydralazine Followed by cardiology Counseled for compliance with the medications Advised DASH diet and moderate exercise/walking, at least 150 mins/week

## 2023-05-10 ENCOUNTER — Other Ambulatory Visit: Payer: Self-pay | Admitting: Cardiology

## 2023-05-10 ENCOUNTER — Encounter: Payer: Self-pay | Admitting: Cardiology

## 2023-05-10 NOTE — Telephone Encounter (Signed)
Error

## 2023-05-13 ENCOUNTER — Encounter (HOSPITAL_COMMUNITY): Payer: Self-pay | Admitting: *Deleted

## 2023-05-13 ENCOUNTER — Emergency Department (HOSPITAL_COMMUNITY)
Admission: EM | Admit: 2023-05-13 | Discharge: 2023-05-13 | Disposition: A | Discharge status: Home / Self Care | Payer: Medicare Other | Attending: Emergency Medicine | Admitting: Emergency Medicine

## 2023-05-13 ENCOUNTER — Other Ambulatory Visit: Payer: Self-pay

## 2023-05-13 DIAGNOSIS — Z7982 Long term (current) use of aspirin: Secondary | ICD-10-CM | POA: Diagnosis not present

## 2023-05-13 DIAGNOSIS — Z79899 Other long term (current) drug therapy: Secondary | ICD-10-CM | POA: Insufficient documentation

## 2023-05-13 DIAGNOSIS — N390 Urinary tract infection, site not specified: Secondary | ICD-10-CM | POA: Diagnosis not present

## 2023-05-13 DIAGNOSIS — R1031 Right lower quadrant pain: Secondary | ICD-10-CM

## 2023-05-13 DIAGNOSIS — Z7902 Long term (current) use of antithrombotics/antiplatelets: Secondary | ICD-10-CM | POA: Diagnosis not present

## 2023-05-13 LAB — URINALYSIS, ROUTINE W REFLEX MICROSCOPIC
Bilirubin Urine: NEGATIVE
Glucose, UA: NEGATIVE mg/dL
Ketones, ur: 20 mg/dL — AB
Nitrite: NEGATIVE
Protein, ur: 30 mg/dL — AB
Specific Gravity, Urine: 1.01 (ref 1.005–1.030)
WBC, UA: >50 WBC/hpf (ref 0–5)
pH: 7 (ref 5.0–8.0)

## 2023-05-13 MED ORDER — CEPHALEXIN 500 MG PO CAPS: 500.0000 mg | ORAL_CAPSULE | Freq: Three times a day (TID) | ORAL | 0 refills | Status: DC

## 2023-05-13 MED ORDER — CEPHALEXIN 500 MG PO CAPS
1000.0000 mg | ORAL_CAPSULE | Freq: Once | ORAL | Status: AC
  Administered 2023-05-13: 1000 mg via ORAL
  Filled 2023-05-13: qty 2

## 2023-05-13 MED ORDER — ACETAMINOPHEN 500 MG PO TABS
1000.0000 mg | ORAL_TABLET | Freq: Once | ORAL | Status: AC
  Administered 2023-05-13: 1000 mg via ORAL
  Filled 2023-05-13: qty 2

## 2023-05-13 NOTE — ED Provider Notes (Signed)
[place] EMERGENCY DEPARTMENT AT [facility] Provider Note   CSN: [id number] Arrival date & time: 05/13/23  4401     History  Chief Complaint  Patient presents with   Groin Pain    Chase Briggs is a 72 y.o. male.  Pt with right groin pain in past 3-4 days. Dull, non radiating pain, without consistent exacerbating or alleviating factors. No dysuria or hematuria. No scrotal or testicular pain. No back/flank pain. No fever or chills. Denies injury or strain to area.   The history is provided by the patient and medical records.  Groin Pain Pertinent negatives include no abdominal pain.       Home Medications Prior to Admission medications   Medication Sig Start Date End Date Taking? Authorizing Provider  albuterol (VENTOLIN HFA) 108 (90 Base) MCG/ACT inhaler Inhale 2 puffs into the lungs every 6 (six) hours as needed for wheezing or shortness of breath. 04/06/23   Anabel Halon, MD  amLODipine (NORVASC) 10 MG tablet Take 1 tablet (10 mg total) by mouth daily. 04/06/23   Anabel Halon, MD  aspirin EC 81 MG tablet Take 1 tablet (81 mg total) by mouth daily with breakfast. 04/23/22   Shon Hale, MD  atorvastatin (LIPITOR) 10 MG tablet Take 1 tablet (10 mg total) by mouth daily. 04/06/23   Anabel Halon, MD  benzonatate (TESSALON) 200 MG capsule Take 1 capsule (200 mg total) by mouth 2 (two) times daily as needed for cough. 01/20/23   Anabel Halon, MD  cetirizine (ZYRTEC) 10 MG tablet Take 1 tablet (10 mg total) by mouth daily. 04/06/23   Anabel Halon, MD  Cholecalciferol (VITAMIN D) 125 MCG (5000 UT) CAPS Take 2,000 Units by mouth daily. 04/23/22   Shon Hale, MD  ciprofloxacin (CIPRO) 500 MG tablet Take 1 tablet (500 mg total) by mouth 2 (two) times daily. 04/16/23   Terrilee Files, MD  cloNIDine (CATAPRES) 0.1 MG tablet Take 1 tablet (0.1 mg total) by mouth 2 (two) times daily. 04/06/23   Anabel Halon, MD  clopidogrel (PLAVIX) 75 MG tablet TAKE  (1) TABLET BY MOUTH ONCE DAILY. 04/06/23   Anabel Halon, MD  cyclobenzaprine (FLEXERIL) 10 MG tablet Take 1 tablet (10 mg total) by mouth 2 (two) times daily as needed for muscle spasms. Patient not taking: Reported on 01/18/2023 09/15/22   Anabel Halon, MD  EPINEPHrine 0.3 mg/0.3 mL IJ SOAJ injection Inject 0.3 mg into the muscle as needed for anaphylaxis. 02/10/23   Gardenia Phlegm, MD  glucose blood test strip 1 each by Other route 2 (two) times daily. Use as instructed bid E11.65 Relion Premier 05/15/19   Roma Kayser, MD  hydrochlorothiazide (HYDRODIURIL) 25 MG tablet TAKE 1 TABLET BY MOUTH DAILY 05/10/23   Antoine Poche, MD  hydrochlorothiazide (MICROZIDE) 12.5 MG capsule Take 1 capsule (12.5 mg total) by mouth daily. 04/06/23   Anabel Halon, MD  insulin glargine (LANTUS SOLOSTAR) 100 UNIT/ML Solostar Pen Inject 14 Units into the skin daily with breakfast. INJECT 14 UNITS UNDER THE SKIN AT  [facility] 02/25/23   Roma Kayser, MD  Lancets MISC 1 each by Does not apply route 2 (two) times daily. E11.65 Relion Premier 05/15/19   Roma Kayser, MD  lisinopril (ZESTRIL) 40 MG tablet Take 1 tablet (40 mg total) by mouth daily. 09/21/22   Antoine Poche, MD  promethazine-dextromethorphan (PROMETHAZINE-DM) 6.25-15 MG/5ML syrup Take 5 mLs by mouth 4 (  four) times daily as needed for cough. Patient not taking: Reported on 01/18/2023 12/30/22   Anabel Halon, MD  RESTASIS 0.05 % ophthalmic emulsion Place 1 drop into both eyes 2 (two) times daily as needed. 10/02/21   [provider]  SURE COMFORT PEN NEEDLES 32G X 4 MM MISC USE ONCE DAILY 10/30/22   Roma Kayser, MD  traZODone (DESYREL) 100 MG tablet Take 1 tablet (100 mg total) by mouth daily with breakfast. 04/06/23   Anabel Halon, MD      Allergies    Patient has no known allergies.    Review of Systems   Review of Systems  Constitutional:  Negative for fever.  Gastrointestinal:  Negative for  abdominal pain, diarrhea and vomiting.  Genitourinary:  Negative for dysuria, flank pain, hematuria, scrotal swelling and testicular pain.  Musculoskeletal:  Negative for back pain.  Skin:  Negative for rash.    Physical Exam Updated Vital Signs BP (!) 157/76   Pulse 76   Temp 98.3 F (36.8 C) (Oral)   Resp 14   Ht 1.702 m (5\' 7" )   Wt 58.1 kg   SpO2 96%   BMI 20.05 kg/m  Physical Exam Vitals and nursing note reviewed.  Constitutional:      Appearance: Normal appearance. He is well-developed.  HENT:     Head: Atraumatic.     Nose: Nose normal.     Mouth/Throat:     Mouth: Mucous membranes are moist.  Eyes:     General: No scleral icterus.    Conjunctiva/sclera: Conjunctivae normal.  Neck:     Trachea: No tracheal deviation.  Cardiovascular:     Rate and Rhythm: Normal rate and regular rhythm.     Pulses: Normal pulses.  Pulmonary:     Effort: Pulmonary effort is normal. No accessory muscle usage or respiratory distress.     Breath sounds: Normal breath sounds.  Abdominal:     General: Bowel sounds are normal. There is no distension.     Palpations: Abdomen is soft. There is no mass.     Tenderness: There is no abdominal tenderness. There is no guarding.     Hernia: No hernia is present.  Genitourinary:    Comments: No cva tenderness. Normal external gu exam. No scrotal or testicular pain, swelling, or tenderness.  Musculoskeletal:        General: No swelling.     Cervical back: Neck supple.     Comments: Good passive rom right hip/knee without pain. Fem pulse 2+.  No sts, erythema, or skin lesion to area of pain.   Skin:    General: Skin is warm and dry.     Findings: No rash.  Neurological:     Mental Status: He is alert.     Comments: Alert, speech clear.   Psychiatric:        Mood and Affect: Mood normal.     ED Results / Procedures / Treatments   Labs (all labs ordered are listed, but only abnormal results are displayed) Results for orders placed or  performed during the hospital encounter of 05/13/23  Urinalysis, Routine w reflex microscopic -Urine, Clean Catch  Result Value Ref Range   Color, Urine YELLOW YELLOW   APPearance HAZY (A) CLEAR   Specific Gravity, Urine 1.010 1.005 - 1.030   pH 7.0 5.0 - 8.0   Glucose, UA NEGATIVE NEGATIVE mg/dL   Hgb urine dipstick MODERATE (A) NEGATIVE   Bilirubin Urine NEGATIVE NEGATIVE  Ketones, ur 20 (A) NEGATIVE mg/dL   Protein, ur 30 (A) NEGATIVE mg/dL   Nitrite NEGATIVE NEGATIVE   Leukocytes,Ua LARGE (A) NEGATIVE   RBC / HPF 6-10 0 - 5 RBC/hpf   WBC, UA >50 0 - 5 WBC/hpf   Bacteria, UA RARE (A) NONE SEEN   Squamous Epithelial / HPF 0-5 0 - 5 /HPF   WBC Clumps PRESENT    Budding Yeast PRESENT    US SCROTUM W/DOPPLER  Result Date: 04/16/2023 CLINICAL DATA:  Right-sided testicular pain for a day EXAM: SCROTAL ULTRASOUND DOPPLER ULTRASOUND OF THE TESTICLES TECHNIQUE: Complete ultrasound examination of the testicles, epididymis, and other scrotal structures was performed. Color and spectral Doppler ultrasound were also utilized to evaluate blood flow to the testicles. COMPARISON:  None Available. FINDINGS: Right testicle Measurements: 3.3 x 1.9 x 2.3 cm. No mass or microlithiasis visualized. Small calcification, echogenic shadowing area along the margin of the right testicle, possible scrotal pearl. Left testicle Measurements: 3.2 x 2.0 x 2.7 cm. No mass or microlithiasis visualized. Right epididymis: Slightly enlarged compared to left and more heterogeneous with more flow on color Doppler. Left epididymis:  Normal in size and appearance. Hydrocele:  Small Varicocele:  None visualized. Pulsed Doppler interrogation of both testes demonstrates increased blood flow to the right testicle compared to left. IMPRESSION: Heterogeneous right epididymis which is enlarged with increased blood flow to the right testicle and epididymis. Please correlate for epididymal orchitis. Small hydroceles. Electronically Signed    By: Karen Kays M.D.   On: 04/16/2023 11:23     EKG None  Radiology No results found.  Procedures Procedures    Medications Ordered in ED Medications  cephALEXin (KEFLEX) capsule 1,000 mg (has no administration in time range)  acetaminophen (TYLENOL) tablet 1,000 mg (1,000 mg Oral Given 05/13/23 0917)    ED Course/ Medical Decision Making/ A&P                                 Medical Decision Making Problems Addressed: Acute UTI: acute illness or injury with systemic symptoms Right groin pain: acute illness or injury with systemic symptoms  Amount and/or Complexity of Data Reviewed External Data Reviewed: labs, radiology and notes. Labs: ordered. Decision-making details documented in ED Course. Radiology:  Decision-making details documented in ED Course.  Risk OTC drugs. Prescription drug management.      Reviewed nursing notes and prior charts for additional history. External reports reviewed. Pt with recent u/s to eval for right groin/scrotal pain ~ 3 weeks ago, possible epididymitis then.  Pt indicates completed abx therapy. No current scrotal or testicular pain or swelling. Pt also w recent ct to eval left groin pain - neg for hernia then.   Labs reviewed/interpreted by me - ua w > 50 wbc, ?uti, will cx and rx. Keflex po.   No meds this AM or prior to arrival. Acetaminophen po.   Recent CT reviewed/interpreted by me - no hernia.   Pt comfortable appearing, no distress. No abd/flank pain. Abd soft nt. No nv. No fevers. Pt currently appears stable for d/c.   Rec close pcp f/u.  Return precautions provided.          Final Clinical Impression(s) / ED Diagnoses Final diagnoses:  Right groin pain    Rx / DC Orders ED Discharge Orders     None         Cathren Laine, MD 05/13/23 1211

## 2023-05-13 NOTE — ED Notes (Signed)
Lab aware of the need for urine culture.

## 2023-05-13 NOTE — Discharge Instructions (Addendum)
It was our pleasure to provide your ER care today - we hope that you feel better.    Your lab tests show a urine infection.  Take keflex (antibiotic) as prescribed. We sent a urine culture test the results of which should be back in two days time - have your primary care doctor follow up on that result then.   You may take acetaminophen or ibuprofen as need.   Follow up with primary care doctor in the next 1-2 weeks.   Return to ER if worse, new symptoms, fevers, new or severe abdominal pain, persistent vomiting, scrotal/testicle pain and swelling, or other concern.

## 2023-05-13 NOTE — ED Triage Notes (Signed)
Pt c/o right groin pain that started Sunday night after he sat down. Pt initially thought his right side of abdomen was bloated and he might be constipated so he took a laxative. He was able to have several bowel movements with relief in bloating, but not the pain. Pt also c/o that on Tuesday morning he woke up with a cold sore on his right upper lip.

## 2023-05-14 LAB — URINE CULTURE: Culture: <10000 — AB

## 2023-05-18 ENCOUNTER — Telehealth: Payer: Self-pay | Admitting: Internal Medicine

## 2023-05-18 ENCOUNTER — Ambulatory Visit (INDEPENDENT_AMBULATORY_CARE_PROVIDER_SITE_OTHER): Payer: Medicare Other | Admitting: Internal Medicine

## 2023-05-18 ENCOUNTER — Encounter: Payer: Self-pay | Admitting: Internal Medicine

## 2023-05-18 VITALS — BP 138/72 | HR 107 | Ht 67.0 in | Wt 123.4 lb

## 2023-05-18 DIAGNOSIS — Z09 Encounter for follow-up examination after completed treatment for conditions other than malignant neoplasm: Secondary | ICD-10-CM

## 2023-05-18 DIAGNOSIS — L568 Other specified acute skin changes due to ultraviolet radiation: Secondary | ICD-10-CM | POA: Diagnosis not present

## 2023-05-18 DIAGNOSIS — N453 Epididymo-orchitis: Secondary | ICD-10-CM | POA: Diagnosis not present

## 2023-05-18 DIAGNOSIS — I10 Essential (primary) hypertension: Secondary | ICD-10-CM

## 2023-05-18 DIAGNOSIS — E441 Mild protein-calorie malnutrition: Secondary | ICD-10-CM | POA: Diagnosis not present

## 2023-05-18 MED ORDER — CIPROFLOXACIN HCL 500 MG PO TABS
500.0000 mg | ORAL_TABLET | Freq: Two times a day (BID) | ORAL | 0 refills | Status: DC
Start: 2023-05-18 — End: 2023-05-25

## 2023-05-18 NOTE — Telephone Encounter (Signed)
Patient scheduled to see dr patel today

## 2023-05-18 NOTE — Telephone Encounter (Signed)
Patient seen in the ER asking can Dr Allena Katz work him into his scheduled before his 09.11.2024, there are no openings available til the 09.11.2024. patient call back # [phone] seen in the ER having growing in stomach right side lower pain.

## 2023-05-18 NOTE — Patient Instructions (Addendum)
Please start taking Ciprofloxacin if you have recurrent scrotal pain.  You are being referred to Urology.

## 2023-05-19 ENCOUNTER — Ambulatory Visit (INDEPENDENT_AMBULATORY_CARE_PROVIDER_SITE_OTHER): Payer: Medicare Other | Admitting: Urology

## 2023-05-19 ENCOUNTER — Encounter: Payer: Self-pay | Admitting: Internal Medicine

## 2023-05-19 VITALS — BP 125/71 | HR 98

## 2023-05-19 DIAGNOSIS — N401 Enlarged prostate with lower urinary tract symptoms: Secondary | ICD-10-CM | POA: Diagnosis not present

## 2023-05-19 DIAGNOSIS — R3914 Feeling of incomplete bladder emptying: Secondary | ICD-10-CM

## 2023-05-19 DIAGNOSIS — N410 Acute prostatitis: Secondary | ICD-10-CM

## 2023-05-19 DIAGNOSIS — N138 Other obstructive and reflux uropathy: Secondary | ICD-10-CM

## 2023-05-19 DIAGNOSIS — N489 Disorder of penis, unspecified: Secondary | ICD-10-CM

## 2023-05-19 DIAGNOSIS — N453 Epididymo-orchitis: Secondary | ICD-10-CM

## 2023-05-19 DIAGNOSIS — R3912 Poor urinary stream: Secondary | ICD-10-CM | POA: Diagnosis not present

## 2023-05-19 DIAGNOSIS — L568 Other specified acute skin changes due to ultraviolet radiation: Secondary | ICD-10-CM | POA: Insufficient documentation

## 2023-05-19 LAB — MICROSCOPIC EXAMINATION: WBC, UA: >30 /HPF — AB (ref 0–5)

## 2023-05-19 LAB — URINALYSIS, ROUTINE W REFLEX MICROSCOPIC
Bilirubin, UA: NEGATIVE
Ketones, UA: NEGATIVE
Nitrite, UA: NEGATIVE
Specific Gravity, UA: 1.02 (ref 1.005–1.030)
Urobilinogen, Ur: 0.2 mg/dL (ref 0.2–1.0)
pH, UA: 7.5 (ref 5.0–7.5)

## 2023-05-19 MED ORDER — ALFUZOSIN HCL ER 10 MG PO TB24
10.0000 mg | ORAL_TABLET | Freq: Every day | ORAL | 11 refills | Status: DC
Start: 2023-05-19 — End: 2023-06-09

## 2023-05-19 MED ORDER — MUPIROCIN 2 % EX OINT
1.0000 | TOPICAL_OINTMENT | Freq: Two times a day (BID) | CUTANEOUS | 0 refills | Status: DC
Start: 2023-05-19 — End: 2023-05-25

## 2023-05-19 MED ORDER — SULFAMETHOXAZOLE-TRIMETHOPRIM 800-160 MG PO TABS
1.0000 | ORAL_TABLET | Freq: Two times a day (BID) | ORAL | 0 refills | Status: AC
Start: 2023-05-19 — End: ?

## 2023-05-19 NOTE — Assessment & Plan Note (Signed)
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

## 2023-05-19 NOTE — Progress Notes (Signed)
05/19/2023 2:20 PM   Chase Briggs [birth year] [id number]  Referring provider: Anabel Halon, MD [address] [place],  [place] [address]  Testicular pain  HPI: Chase Briggs is a 71yo here for evaluation of testicular pain. He has had 2 episodes of epididymo orchitis in the past month. IPSS 21 QOL 5 on no therapy. IPSS 21 QOL 5. Urine stream is weak. He has straining to urinate. Nocturia 2-3x.    PMH: Past Medical History:  Diagnosis Date   Diabetes mellitus    Fatty liver    GERD (gastroesophageal reflux disease)    HTN (hypertension)    Hyperlipidemia    Insomnia    Renal insufficiency     Surgical History: Past Surgical History:  Procedure Laterality Date   ABDOMINAL AORTOGRAM W/LOWER EXTREMITY N/A 08/22/2018   Procedure: ABDOMINAL AORTOGRAM W/LOWER EXTREMITY;  Surgeon: [name], MD;  Location[facility] INVASIVE CV LAB;  Service: Cardiovascular;  Laterality: N/A;   BACK SURGERY     CARPAL TUNNEL WITH CUBITAL TUNNEL Right 03/21/2019   Procedure: RIGHT CARPAL TUNNEL AND CUBITAL  TUNNEL RELEASES;  Surgeon: [name], MD;  Location: [facility];  Service: Orthopedics;  Laterality: Right;  AXILLARY BLOCK   COLONOSCOPY  01/2007   Dr. [name], reviewed report, mild diverticulosis. Prep adequate. Next TCS due 01/2017   ESOPHAGOGASTRODUODENOSCOPY N/A 10/21/2012   [id number] GASTRITIS   MASS EXCISION Right 10/26/2012   Procedure: EXCISION MASS;  Surgeon: [name], MD;  Location: [facility];  Service: General;  Laterality: Right;   PERIPHERAL VASCULAR INTERVENTION  08/22/2018   Procedure: PERIPHERAL VASCULAR INTERVENTION;  Surgeon: [name], MD;  Location: [facility] INVASIVE CV LAB;  Service: Cardiovascular;;  bilateral external iliac stents   scalp mass  4-5 yrs ago   Dr Angelique Holm NERVE TRANSPOSITION Right 03/21/2019   Procedure: DECOMPRESSION RIGHT ULNAR NERVE;  Surgeon: [name], MD;  Location: [facility];   Service: Orthopedics;  Laterality: Right;    Home Medications:  Allergies as of 05/19/2023   No Known Allergies      Medication List        Accurate as of May 19, 2023  2:20 PM. If you have any questions, ask your nurse or doctor.          albuterol 108 (90 Base) MCG/ACT inhaler Commonly known as: VENTOLIN HFA Inhale 2 puffs into the lungs every 6 (six) hours as needed for wheezing or shortness of breath.   amLODipine 10 MG tablet Commonly known as: NORVASC Take 1 tablet (10 mg total) by mouth daily.   aspirin EC 81 MG tablet Take 1 tablet (81 mg total) by mouth daily with breakfast.   atorvastatin 10 MG tablet Commonly known as: LIPITOR Take 1 tablet (10 mg total) by mouth daily.   benzonatate 200 MG capsule Commonly known as: TESSALON Take 1 capsule (200 mg total) by mouth 2 (two) times daily as needed for cough.   cetirizine 10 MG tablet Commonly known as: ZYRTEC Take 1 tablet (10 mg total) by mouth daily.   ciprofloxacin 500 MG tablet Commonly known as: CIPRO Take 1 tablet (500 mg total) by mouth 2 (two) times daily.   cloNIDine 0.1 MG tablet Commonly known as: CATAPRES Take 1 tablet (0.1 mg total) by mouth 2 (two) times daily.   clopidogrel 75 MG tablet Commonly known as: PLAVIX TAKE (1) TABLET BY MOUTH ONCE DAILY.   cyclobenzaprine 10 MG tablet Commonly known  as: FLEXERIL Take 1 tablet (10 mg total) by mouth 2 (two) times daily as needed for muscle spasms.   EPINEPHrine 0.3 mg/0.3 mL Soaj injection Commonly known as: EPI-PEN Inject 0.3 mg into the muscle as needed for anaphylaxis.   glucose blood test strip 1 each by Other route 2 (two) times daily. Use as instructed bid E11.65 Relion Premier   hydrochlorothiazide 12.5 MG capsule Commonly known as: MICROZIDE Take 1 capsule (12.5 mg total) by mouth daily.   hydrochlorothiazide 25 MG tablet Commonly known as: HYDRODIURIL TAKE 1 TABLET BY MOUTH DAILY   Lancets Misc 1 each by Does not  apply route 2 (two) times daily. E11.65 Relion Premier   Lantus SoloStar 100 UNIT/ML Solostar Pen Generic drug: insulin glargine Inject 14 Units into the skin daily with breakfast. INJECT 14 UNITS UNDER THE SKIN AT  BREAKFAST   lisinopril 40 MG tablet Commonly known as: ZESTRIL Take 1 tablet (40 mg total) by mouth daily.   mupirocin ointment 2 % Commonly known as: BACTROBAN Apply 1 Application topically 2 (two) times daily. Over upper lip area.   promethazine-dextromethorphan 6.25-15 MG/5ML syrup Commonly known as: PROMETHAZINE-DM Take 5 mLs by mouth 4 (four) times daily as needed for cough.   Restasis 0.05 % ophthalmic emulsion Generic drug: cycloSPORINE Place 1 drop into both eyes 2 (two) times daily as needed.   Sure Comfort Pen Needles 32G X 4 MM Misc Generic drug: Insulin Pen Needle USE ONCE DAILY   traZODone 100 MG tablet Commonly known as: DESYREL Take 1 tablet (100 mg total) by mouth daily with breakfast.   Vitamin D 125 MCG (5000 UT) Caps Take 2,000 Units by mouth daily.        Allergies: No Known Allergies  Family History: Family History  Problem Relation Age of Onset   Diabetes Mother    Diabetes Father    Diabetes Brother    Colon cancer Neg Hx    Liver disease Neg Hx     Social History:  reports that he has been smoking cigarettes. He has a 17.5 pack-year smoking history. He has never used smokeless tobacco. He reports that he does not drink alcohol and does not use drugs.  ROS: All other review of systems were reviewed and are negative except what is noted above in HPI  Physical Exam: BP 125/71   Pulse 98   Constitutional:  Alert and oriented, No acute distress. HEENT: [place] AT, moist mucus membranes.  Trachea midline, no masses. Cardiovascular: No clubbing, cyanosis, or edema. Respiratory: Normal respiratory effort, no increased work of breathing. GI: Abdomen is soft, nontender, nondistended, no abdominal masses GU: No CVA tenderness.  Circumcised phallus. No masses/lesions on penis, testis, scrotum. Prostate 40g smooth no nodules no induration.  Lymph: No cervical or inguinal lymphadenopathy. Skin: No rashes, bruises or suspicious lesions. Neurologic: Grossly intact, no focal deficits, moving all 4 extremities. Psychiatric: Normal mood and affect.  Laboratory Data: Lab Results  Component Value Date   WBC 4.9 12/25/2022   HGB 14.0 12/25/2022   HCT 41.8 12/25/2022   MCV 95.7 12/25/2022   PLT 146 (L) 12/25/2022    Lab Results  Component Value Date   CREATININE 1.28 (H) 03/08/2023    No results found for: "PSA"  No results found for: "TESTOSTERONE"  Lab Results  Component Value Date   HGBA1C 6.6 02/25/2023    Urinalysis    Component Value Date/Time   COLORURINE YELLOW 05/13/2023 1055   APPEARANCEUR HAZY (A) 05/13/2023 1055  APPEARANCEUR Clear 07/03/2022 0927   LABSPEC 1.010 05/13/2023 1055   PHURINE 7.0 05/13/2023 1055   GLUCOSEU NEGATIVE 05/13/2023 1055   HGBUR MODERATE (A) 05/13/2023 1055   BILIRUBINUR NEGATIVE 05/13/2023 1055   BILIRUBINUR Negative 07/03/2022 0927   KETONESUR 20 (A) 05/13/2023 1055   PROTEINUR 30 (A) 05/13/2023 1055   UROBILINOGEN 1.0 03/06/2022 0829   UROBILINOGEN 0.2 02/19/2014 1340   NITRITE NEGATIVE 05/13/2023 1055   LEUKOCYTESUR LARGE (A) 05/13/2023 1055    Lab Results  Component Value Date   LABMICR 107.6 11/26/2022   WBCUA 6-10 (A) 07/03/2022   LABEPIT 0-10 07/03/2022   MUCUS Present 09/23/2020   BACTERIA RARE (A) 05/13/2023    Pertinent Imaging:  No results found for this or any previous visit.  Results for orders placed during the hospital encounter of 10/15/22  US Venous Img Lower Bilateral (DVT)  Narrative CLINICAL DATA:  Acute bilateral lower extremity pain.  EXAM: BILATERAL LOWER EXTREMITY VENOUS DOPPLER ULTRASOUND  TECHNIQUE: Gray-scale sonography with graded compression, as well as color Doppler and duplex ultrasound were performed to  evaluate the lower extremity deep venous systems from the level of the common femoral vein and including the common femoral, femoral, profunda femoral, popliteal and calf veins including the posterior tibial, peroneal and gastrocnemius veins when visible. The superficial great saphenous vein was also interrogated. Spectral Doppler was utilized to evaluate flow at rest and with distal augmentation maneuvers in the common femoral, femoral and popliteal veins.  COMPARISON:  None Available.  FINDINGS: RIGHT LOWER EXTREMITY  Common Femoral Vein: No evidence of thrombus. Normal compressibility, respiratory phasicity and response to augmentation.  Saphenofemoral Junction: No evidence of thrombus. Normal compressibility and flow on color Doppler imaging.  Profunda Femoral Vein: No evidence of thrombus. Normal compressibility and flow on color Doppler imaging.  Femoral Vein: No evidence of thrombus. Normal compressibility, respiratory phasicity and response to augmentation.  Popliteal Vein: No evidence of thrombus. Normal compressibility, respiratory phasicity and response to augmentation.  Calf Veins: No evidence of thrombus. Normal compressibility and flow on color Doppler imaging.  Superficial Great Saphenous Vein: No evidence of thrombus. Normal compressibility.  Venous Reflux:  None.  Other Findings:  None.  LEFT LOWER EXTREMITY  Common Femoral Vein: No evidence of thrombus. Normal compressibility, respiratory phasicity and response to augmentation.  Saphenofemoral Junction: No evidence of thrombus. Normal compressibility and flow on color Doppler imaging.  Profunda Femoral Vein: No evidence of thrombus. Normal compressibility and flow on color Doppler imaging.  Femoral Vein: No evidence of thrombus. Normal compressibility, respiratory phasicity and response to augmentation.  Popliteal Vein: No evidence of thrombus. Normal compressibility, respiratory phasicity and  response to augmentation.  Calf Veins: No evidence of thrombus. Normal compressibility and flow on color Doppler imaging.  Superficial Great Saphenous Vein: No evidence of thrombus. Normal compressibility.  Venous Reflux:  None.  Other Findings:  None.  IMPRESSION: No evidence of deep venous thrombosis in either lower extremity.   Electronically Signed By: Lupita Raider M.D. On: 10/15/2022 15:56  No results found for this or any previous visit.  No results found for this or any previous visit.  No results found for this or any previous visit.  No valid procedures specified. No results found for this or any previous visit.  Results for orders placed during the hospital encounter of 01/28/21  CT Renal Stone Study  Narrative CLINICAL DATA:  Left-sided flank pain and dysuria  EXAM: CT ABDOMEN AND PELVIS WITHOUT CONTRAST  TECHNIQUE: Multidetector  CT imaging of the abdomen and pelvis was performed following the standard protocol without IV contrast.  COMPARISON:  10/30/2017  FINDINGS: Lower chest: Emphysematous changes are noted. No focal infiltrate or effusion is seen.  Hepatobiliary: Gallbladder is within normal limits. Area of focal fatty infiltration is noted along the falciform ligament. No other focal abnormality is noted.  Pancreas: Unremarkable. No pancreatic ductal dilatation or surrounding inflammatory changes.  Spleen: Normal in size without focal abnormality.  Adrenals/Urinary Tract: Adrenal glands are within normal limits. Right kidney is well visualized without renal calculi or obstructive changes. The left kidney demonstrates some increased perinephric stranding. A few tiny left renal stones are noted. No stones are identified. The bladder is incompletely distended.  Stomach/Bowel: Scattered diverticular change of the colon is noted. The appendix is well visualized and within normal limits. Small bowel and stomach are  unremarkable.  Vascular/Lymphatic: Aortic atherosclerosis. No enlarged abdominal or pelvic lymph nodes.  Reproductive: Prostate is unremarkable.  Other: No abdominal wall hernia or abnormality. No abdominopelvic ascites.  Musculoskeletal: Degenerative changes of lumbar spine are noted.  IMPRESSION: A few tiny nonobstructing left renal calculi.  Mild diverticular change without diverticulitis.  Mild fatty infiltration of the liver along the falciform ligament.  Aortic Atherosclerosis (ICD10-I70.0) and Emphysema (ICD10-J43.9).   Electronically Signed By: Alcide Clever M.D. On: 01/28/2021 15:18   Assessment & Plan:    BPH with weak urinary stream -Uroxatral 10mg  QHS - BLADDER SCAN AMB NON-IMAGING  2. Prostatitis -Bactrim DS BID for 28 days   No follow-ups on file.  Wilkie Aye, MD  [facility] [place]

## 2023-05-19 NOTE — Assessment & Plan Note (Signed)
Recent ER visit for right scrotal pain, more likely epididymoorchitis than acute cystitis Prescribed Ciprofloxacin for total of 14 days again, advised to start if he has recurrent pain Refer to urology Advised to use scrotal support

## 2023-05-19 NOTE — Progress Notes (Signed)
post void residual=35 

## 2023-05-19 NOTE — Assessment & Plan Note (Signed)
BP Readings from Last 1 Encounters:  05/18/23 138/72   Elevated today, but usually WNL Usually well-controlled with amlodipine 10 mg QD, lisinopril 40 mg QD, HCTZ 12.5 mg QD and clonidine 0.1 mg BID Since he had dizziness, decreased dose of HCTZ to 12.5 mg QD in the last visit If persistently elevated BP, can add hydralazine Followed by cardiology Counseled for compliance with the medications Advised DASH diet and moderate exercise/walking, at least 150 mins/week

## 2023-05-19 NOTE — Progress Notes (Signed)
Established Patient Office Visit  Subjective:  Patient ID: Chase Briggs, male    DOB: 04-30-1951  Age: 72 y.o. MRN: [id number]  CC:  Chief Complaint  Patient presents with   Follow-up    ER follow up     HPI Chase Briggs is a 72 y.o. male with past medical history of HTN, PAD, allergic rhinitis, type II DM, HLD, insomnia, anemia and tobacco abuse who presents for f/u of his chronic medical conditions and follow-up after recent ER visit for right scrotal pain.    He was diagnosed with acute epididymitis on 04/16/23 and was given ciprofloxacin.  He had noticed improvement in scrotal pain since taking ciprofloxacin for 7 days and his antibiotic duration was extended during the last outpatient visit with [place].  He had recurrence of right-sided scrotal area pain and right inguinal area discomfort and went to ER on 05/13/23.  He had UA done and was told of UTI, was given Keflex, but he did not take it due to repeated antibiotic exposure recently.  Denies any fever, chills, dysuria, hematuria, urethral discharge or perineal rash.  He is concerned about recent weight loss, about 7 lbs since 01/20/23.  He reports lack of appetite and decrease in p.o. intake, but denies any nausea, vomiting, diarrhea, night sweats or LAD.  He is up to date with low-dose CT screening, which showed 3.8 mm lung nodule in 12/23, but did not meet criteria for biopsy.  HTN: BP is elevated today, but reports that his blood pressure remains well controlled at home. Takes medications regularly.  His dose of HCTZ was decreased to 12.5 mg from 25 mg as he was having dizziness from orthostatic hypotension.  He is still taking lisinopril 40 mg QD, amlodipine 10 mg QD, HCTZ 12.5 mg QD and clonidine 0.1 mg BID. Patient denies headache, chest pain, or palpitations.  He follows up with cardiology as well.  PAD: Has had right and left iliac stent placement, followed by vascular surgery.  He takes aspirin, Plavix and statin.   Denies any recent worsening of claudication symptoms.   Type II DM with HLD: He takes Lantus 14 units in the morning, followed by Dr. Fransico Him.  Denies any fatigue, polyuria or polydipsia.  He takes Lipitor for HLD. Denies any fever, chills, chronic cough, hemoptysis, or night sweats.  He takes trazodone 100 mg for insomnia.  He reports difficulty maintaining sleep, and wakes up after 4-5 hours of sleep, but has noticed improvement since increasing dose of trazodone.  He also complains of daytime sleepiness.  Does not report snoring. Denies any anhedonia, SI or HI currently.  He smokes about 1 cigarettes/day, but used to smoke 1.5 pack/day x15 years in the past.  He has upper lip lesion for the last 2 weeks.  It was oozing blood initially, but have crusted now.  Denies any discharge currently.   Past Medical History:  Diagnosis Date   Diabetes mellitus    Fatty liver    GERD (gastroesophageal reflux disease)    HTN (hypertension)    Hyperlipidemia    Insomnia    Renal insufficiency     Past Surgical History:  Procedure Laterality Date   ABDOMINAL AORTOGRAM W/LOWER EXTREMITY N/A 08/22/2018   Procedure: ABDOMINAL AORTOGRAM W/LOWER EXTREMITY;  Surgeon: [name], MD;  Location: [facility] INVASIVE CV LAB;  Service: Cardiovascular;  Laterality: N/A;   BACK SURGERY     CARPAL TUNNEL WITH CUBITAL TUNNEL Right 03/21/2019   Procedure: RIGHT CARPAL  TUNNEL AND CUBITAL  TUNNEL RELEASES;  Surgeon: [name], MD;  Location: [facility];  Service: Orthopedics;  Laterality: Right;  AXILLARY BLOCK   COLONOSCOPY  01/2007   Dr. [name], reviewed report, mild diverticulosis. Prep adequate. Next TCS due 01/2017   ESOPHAGOGASTRODUODENOSCOPY N/A 10/21/2012   [id number] GASTRITIS   MASS EXCISION Right 10/26/2012   Procedure: EXCISION MASS;  Surgeon: [name], MD;  Location: [facility];  Service: General;  Laterality: Right;   PERIPHERAL VASCULAR INTERVENTION  08/22/2018   Procedure:  PERIPHERAL VASCULAR INTERVENTION;  Surgeon: [name], MD;  Location: [facility] INVASIVE CV LAB;  Service: Cardiovascular;;  bilateral external iliac stents   scalp mass  4-5 yrs ago   Dr Angelique Holm NERVE TRANSPOSITION Right 03/21/2019   Procedure: DECOMPRESSION RIGHT ULNAR NERVE;  Surgeon: [name], MD;  Location: [facility];  Service: Orthopedics;  Laterality: Right;    Family History  Problem Relation Age of Onset   Diabetes Mother    Diabetes Father    Diabetes Brother    Colon cancer Neg Hx    Liver disease Neg Hx     Social History   Socioeconomic History   Marital status: Married    Spouse name: Not on file   Number of children: 2   Years of education: Not on file   Highest education level: Not on file  Occupational History   Occupation: reitred    Comment: [employer], heavy lifting  Tobacco Use   Smoking status: Every Day    Current packs/day: 0.50    Average packs/day: 0.5 packs/day for 35.0 years (17.5 ttl pk-yrs)    Types: Cigarettes   Smokeless tobacco: Never  Vaping Use   Vaping status: Never Used  Substance and Sexual Activity   Alcohol use: No    Comment: hx heavy etoh x 3 yrs, quit 30+ yrs ago   Drug use: No   Sexual activity: Yes    Birth control/protection: None  Other Topics Concern   Not on file  Social History Narrative   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 Highest level of education: 12th grade. Married for 12 years. Has 2 sons (33 and 21). Lives with wife.   Social Determinants of Health   Financial Resource Strain: Not on file  Food Insecurity: Not on file  Transportation Needs: Not on file  Physical Activity: Not on file  Stress: Not on file  Social Connections: Not on file  Intimate Partner Violence: Not on file    Outpatient Medications Prior to Visit  Medication Sig Dispense Refill   albuterol (VENTOLIN HFA) 108 (90 Base) MCG/ACT inhaler Inhale 2 puffs into the lungs every 6 (six) hours as needed for  wheezing or shortness of breath. 20.1 g 0   amLODipine (NORVASC) 10 MG tablet Take 1 tablet (10 mg total) by mouth daily. 90 tablet 3   aspirin EC 81 MG tablet Take 1 tablet (81 mg total) by mouth daily with breakfast. 30 tablet 11   atorvastatin (LIPITOR) 10 MG tablet Take 1 tablet (10 mg total) by mouth daily. 90 tablet 3   benzonatate (TESSALON) 200 MG capsule Take 1 capsule (200 mg total) by mouth 2 (two) times daily as needed for cough. 30 capsule 0   cetirizine (ZYRTEC) 10 MG tablet Take 1 tablet (10 mg total) by mouth daily. 30 tablet 0   Cholecalciferol (VITAMIN D) 125 MCG (5000 UT) CAPS Take 2,000 Units by mouth daily. 30  capsule 3   cloNIDine (CATAPRES) 0.1 MG tablet Take 1 tablet (0.1 mg total) by mouth 2 (two) times daily. 180 tablet 3   clopidogrel (PLAVIX) 75 MG tablet TAKE (1) TABLET BY MOUTH ONCE DAILY. 90 tablet 3   cyclobenzaprine (FLEXERIL) 10 MG tablet Take 1 tablet (10 mg total) by mouth 2 (two) times daily as needed for muscle spasms. (Patient not taking: Reported on 01/18/2023) 30 tablet 0   EPINEPHrine 0.3 mg/0.3 mL IJ SOAJ injection Inject 0.3 mg into the muscle as needed for anaphylaxis. 1 each 1   glucose blood test strip 1 each by Other route 2 (two) times daily. Use as instructed bid E11.65 Relion Premier 100 each 5   hydrochlorothiazide (HYDRODIURIL) 25 MG tablet TAKE 1 TABLET BY MOUTH DAILY 30 tablet 3   hydrochlorothiazide (MICROZIDE) 12.5 MG capsule Take 1 capsule (12.5 mg total) by mouth daily. 90 capsule 0   insulin glargine (LANTUS SOLOSTAR) 100 UNIT/ML Solostar Pen Inject 14 Units into the skin daily with breakfast. INJECT 14 UNITS UNDER THE SKIN AT  BREAKFAST 15 mL 0   Lancets MISC 1 each by Does not apply route 2 (two) times daily. E11.65 Relion Premier 100 each 5   lisinopril (ZESTRIL) 40 MG tablet Take 1 tablet (40 mg total) by mouth daily. 30 tablet 6   promethazine-dextromethorphan (PROMETHAZINE-DM) 6.25-15 MG/5ML syrup Take 5 mLs by mouth 4 (four) times  daily as needed for cough. (Patient not taking: Reported on 01/18/2023) 118 mL 0   RESTASIS 0.05 % ophthalmic emulsion Place 1 drop into both eyes 2 (two) times daily as needed.     SURE COMFORT PEN NEEDLES 32G X 4 MM MISC USE ONCE DAILY 100 each 3   traZODone (DESYREL) 100 MG tablet Take 1 tablet (100 mg total) by mouth daily with breakfast. 90 tablet 1   cephALEXin (KEFLEX) 500 MG capsule Take 1 capsule (500 mg total) by mouth 3 (three) times daily. 20 capsule 0   ciprofloxacin (CIPRO) 500 MG tablet Take 1 tablet (500 mg total) by mouth 2 (two) times daily. 14 tablet 0   No facility-administered medications prior to visit.    No Known Allergies  ROS Review of Systems  Constitutional:  Negative for chills and fever.  HENT:  Negative for congestion, postnasal drip and sore throat.   Respiratory:  Negative for cough and shortness of breath.   Cardiovascular:  Negative for chest pain and palpitations.  Gastrointestinal:  Positive for constipation. Negative for diarrhea and vomiting.  Genitourinary:  Negative for dysuria and hematuria.       Right scrotal pain  Musculoskeletal:  Positive for back pain. Negative for neck pain and neck stiffness.  Skin:  Negative for rash.  Neurological:  Negative for dizziness and weakness.  Psychiatric/Behavioral:  Positive for sleep disturbance. Negative for agitation and behavioral problems.       Objective:    Physical Exam Vitals reviewed.  Constitutional:      General: He is not in acute distress.    Appearance: He is not diaphoretic.  HENT:     Head: Normocephalic and atraumatic.     Nose: No congestion.     Mouth/Throat:     Lips: Lesions (Upper lip-erythema) present.     Mouth: Mucous membranes are moist.  Eyes:     General: No scleral icterus.    Extraocular Movements: Extraocular movements intact.  Cardiovascular:     Rate and Rhythm: Normal rate and regular rhythm.  Pulses: Normal pulses.     Heart sounds: Normal heart sounds.  No murmur heard. Pulmonary:     Breath sounds: Normal breath sounds. No wheezing or rales.  Genitourinary:    Testes:        Right: Tenderness (Mild) present.  Musculoskeletal:     Cervical back: Neck supple. No tenderness.     Right lower leg: No edema.     Left lower leg: No edema.  Skin:    General: Skin is warm.     Findings: No rash.     Comments: Clubbing of fingernails  Neurological:     General: No focal deficit present.     Mental Status: He is alert and oriented to person, place, and time.  Psychiatric:        Mood and Affect: Mood normal.        Behavior: Behavior normal.     BP 138/72 (BP Location: Left Arm)   Pulse (!) 107   Ht 5\' 7"  (1.702 m)   Wt 123 lb 6.4 oz (56 kg)   SpO2 94%   BMI 19.33 kg/m  Wt Readings from Last 3 Encounters:  05/18/23 123 lb 6.4 oz (56 kg)  05/13/23 128 lb (58.1 kg)  04/27/23 129 lb 9.6 oz (58.8 kg)    No results found for: "TSH" Lab Results  Component Value Date   WBC 4.9 12/25/2022   HGB 14.0 12/25/2022   HCT 41.8 12/25/2022   MCV 95.7 12/25/2022   PLT 146 (L) 12/25/2022   Lab Results  Component Value Date   NA 140 03/08/2023   K 4.2 03/08/2023   CO2 23 03/08/2023   GLUCOSE 158 (H) 03/08/2023   BUN 9 03/08/2023   CREATININE 1.28 (H) 03/08/2023   BILITOT 0.9 12/25/2022   ALKPHOS 61 12/25/2022   AST 25 12/25/2022   ALT 14 12/25/2022   PROT 8.2 (H) 12/25/2022   ALBUMIN 3.9 12/25/2022   CALCIUM 9.8 03/08/2023   ANIONGAP 13 12/25/2022   EGFR 60 03/08/2023   Lab Results  Component Value Date   CHOL 108 08/18/2022   Lab Results  Component Value Date   HDL 48 08/18/2022   Lab Results  Component Value Date   LDLCALC 49 08/18/2022   Lab Results  Component Value Date   TRIG 45 08/18/2022   Lab Results  Component Value Date   CHOLHDL 2.3 08/18/2022   Lab Results  Component Value Date   HGBA1C 6.6 02/25/2023      Assessment & Plan:   Problem List Items Addressed This Visit       Cardiovascular  and Mediastinum   Essential hypertension    BP Readings from Last 1 Encounters:  05/18/23 138/72   Elevated today, but usually WNL Usually well-controlled with amlodipine 10 mg QD, lisinopril 40 mg QD, HCTZ 12.5 mg QD and clonidine 0.1 mg BID Since he had dizziness, decreased dose of HCTZ to 12.5 mg QD in the last visit If persistently elevated BP, can add hydralazine Followed by cardiology Counseled for compliance with the medications Advised DASH diet and moderate exercise/walking, at least 150 mins/week        Digestive   Actinic cheilitis    Lip lesion likely actinic cheilitis Mupirocin ointment for bacterial PPx      Relevant Medications   mupirocin ointment (BACTROBAN) 2 %     Genitourinary   Epididymo-orchitis - Primary    Recent ER visit for right scrotal pain, more likely epididymoorchitis than acute  cystitis Prescribed Ciprofloxacin for total of 14 days again, advised to start if he has recurrent pain Refer to urology Advised to use scrotal support      Relevant Medications   ciprofloxacin (CIPRO) 500 MG tablet   Other Relevant Orders   Ambulatory referral to Urology   Scrotal Support     Other   Mild protein-calorie malnutrition (HCC)    BMI Readings from Last 3 Encounters:  05/18/23 19.33 kg/m  05/13/23 20.05 kg/m  04/27/23 20.30 kg/m   Has weight loss Has decreased appetite, but no other B symptoms Advised to take protein supplement such as Glucerna      Encounter for examination following treatment at hospital    ER chart reviewed including imaging Continue Ciprofloxacin for total of 14 days       Meds ordered this encounter  Medications   ciprofloxacin (CIPRO) 500 MG tablet    Sig: Take 1 tablet (500 mg total) by mouth 2 (two) times daily.    Dispense:  20 tablet    Refill:  0   mupirocin ointment (BACTROBAN) 2 %    Sig: Apply 1 Application topically 2 (two) times daily. Over upper lip area.    Dispense:  22 g    Refill:  0     Follow-up: Return if symptoms worsen or fail to improve.    Anabel Halon, MD

## 2023-05-19 NOTE — Assessment & Plan Note (Addendum)
ER chart reviewed including imaging Started Ciprofloxacin for total of 14 days

## 2023-05-19 NOTE — Assessment & Plan Note (Addendum)
BMI Readings from Last 3 Encounters:  05/18/23 19.33 kg/m  05/13/23 20.05 kg/m  04/27/23 20.30 kg/m   Has weight loss Has decreased appetite, but no other B symptoms Advised to take protein supplement such as Glucerna

## 2023-05-24 DIAGNOSIS — B351 Tinea unguium: Secondary | ICD-10-CM | POA: Diagnosis not present

## 2023-05-24 DIAGNOSIS — L851 Acquired keratosis [keratoderma] palmaris et plantaris: Secondary | ICD-10-CM | POA: Diagnosis not present

## 2023-05-24 DIAGNOSIS — E1142 Type 2 diabetes mellitus with diabetic polyneuropathy: Secondary | ICD-10-CM | POA: Diagnosis not present

## 2023-05-25 ENCOUNTER — Encounter: Payer: Self-pay | Admitting: Urology

## 2023-05-25 ENCOUNTER — Ambulatory Visit (INDEPENDENT_AMBULATORY_CARE_PROVIDER_SITE_OTHER): Payer: Medicare Other | Admitting: Internal Medicine

## 2023-05-25 ENCOUNTER — Encounter: Payer: Self-pay | Admitting: Internal Medicine

## 2023-05-25 VITALS — BP 121/65 | HR 84 | Ht 67.0 in | Wt 125.8 lb

## 2023-05-25 DIAGNOSIS — B370 Candidal stomatitis: Secondary | ICD-10-CM

## 2023-05-25 DIAGNOSIS — I10 Essential (primary) hypertension: Secondary | ICD-10-CM

## 2023-05-25 DIAGNOSIS — L568 Other specified acute skin changes due to ultraviolet radiation: Secondary | ICD-10-CM

## 2023-05-25 DIAGNOSIS — R220 Localized swelling, mass and lump, head: Secondary | ICD-10-CM

## 2023-05-25 MED ORDER — OLMESARTAN MEDOXOMIL 20 MG PO TABS
20.0000 mg | ORAL_TABLET | Freq: Every day | ORAL | 1 refills | Status: DC
Start: 2023-05-25 — End: 2023-06-08

## 2023-05-25 MED ORDER — METHYLPREDNISOLONE ACETATE 80 MG/ML IJ SUSP
40.0000 mg | Freq: Once | INTRAMUSCULAR | Status: AC
Start: 2023-05-25 — End: 2023-05-25
  Administered 2023-05-25: 40 mg via INTRAMUSCULAR

## 2023-05-25 MED ORDER — HYDROCHLOROTHIAZIDE 25 MG PO TABS
12.5000 mg | ORAL_TABLET | Freq: Every day | ORAL | Status: DC
Start: 2023-05-25 — End: 2023-06-08

## 2023-05-25 MED ORDER — MUPIROCIN 2 % EX OINT: 1.0000 | TOPICAL_OINTMENT | Freq: Two times a day (BID) | CUTANEOUS | 0 refills | Status: AC

## 2023-05-25 MED ORDER — NYSTATIN 100000 UNIT/ML MT SUSP
5.0000 mL | Freq: Four times a day (QID) | OROMUCOSAL | 0 refills | Status: DC
Start: 2023-05-25 — End: 2023-07-29

## 2023-05-25 NOTE — Assessment & Plan Note (Signed)
Nystatin suspension -swish and spit prescribed

## 2023-05-25 NOTE — Patient Instructions (Signed)
Please stop taking Lisinopril and start taking Olmesartan.  Please take Hydrochlorothiazide only half tablet once daily.  Please perform Nystatin swish and spit as prescribed.  Please apply Mupirocin cream over lip area after 2-3 days once the active bleeding or burning stops.  You are being referred to Dermatology.

## 2023-05-25 NOTE — Patient Instructions (Signed)

## 2023-05-25 NOTE — Assessment & Plan Note (Addendum)
Lip lesion initially looked like actinic cheilitis Mupirocin ointment for bacterial Ppx Currently, there is concern for Stevens-Johnson syndrome as well -Depo-Medrol 40 mg IM today It is unclear whether he has reaction from Bactrim and Uroxatral because he had lip swelling and thrush like lesions at a lesser extent for last few months If persistent worsening, will have to stop Bactrim and Uroxatral  Urgent referral to dermatology

## 2023-05-25 NOTE — Assessment & Plan Note (Signed)
Considering recurrent lip swelling, concern for angioedema Discontinue lisinopril Depo-Medrol 40 mg IM today

## 2023-05-25 NOTE — Assessment & Plan Note (Signed)
BP Readings from Last 1 Encounters:  05/25/23 121/65   Usually well-controlled with amlodipine 10 mg QD, lisinopril 40 mg QD, HCTZ 12.5 mg QD and clonidine 0.1 mg BID Due to lip swelling, concern for angioedema -switch from lisinopril to olmesartan 20 mg QD   Since he had dizziness, decreased dose of HCTZ to 12.5 mg QD in the last visit - needs to take only half tablet of 25 mg Followed by cardiology Counseled for compliance with the medications Advised DASH diet and moderate exercise/walking, at least 150 mins/week

## 2023-05-25 NOTE — Progress Notes (Signed)
Acute Office Visit  Subjective:    Patient ID: Chase Briggs, male    DOB: 1951/04/09, 72 y.o.   MRN: [id number]  Chief Complaint  Patient presents with   chapped lips    HPI Patient is in today for complaint of worsening of lip swelling and exfoliation of lip skin.  He has been having lip swelling and lip lesion since 05/24. He he was recently seen in the last week, that he had upper lip lesion.  He has had severe worsening of the lip swelling and lesion since the last week.  He was given mupirocin ointment, which he has not picked up yet.  Of note, he was placed on Bactrim by urology for epididymoorchitis and Uroxatrol for BPH.  HTN: He takes amlodipine 10 mg QD, clonidine 0.1 mg BID, lisinopril 40 mg QD and HCTZ 25 mg QD currently.  Considering his lip swelling, he has been advised to stop lisinopril and switch to olmesartan today.  He reports chronic, intermittent dizziness.  He was told to decrease HCTZ to 12.5 mg, but his pharmacy has dispensed 25 mg tablets instead.  Denies any chest pain or palpitations currently.   Past Medical History:  Diagnosis Date   Diabetes mellitus    Fatty liver    GERD (gastroesophageal reflux disease)    HTN (hypertension)    Hyperlipidemia    Insomnia    Renal insufficiency     Past Surgical History:  Procedure Laterality Date   ABDOMINAL AORTOGRAM W/LOWER EXTREMITY N/A 08/22/2018   Procedure: ABDOMINAL AORTOGRAM W/LOWER EXTREMITY;  Surgeon: [name], MD;  Location: [facility] INVASIVE CV LAB;  Service: Cardiovascular;  Laterality: N/A;   BACK SURGERY     CARPAL TUNNEL WITH CUBITAL TUNNEL Right 03/21/2019   Procedure: RIGHT CARPAL TUNNEL AND CUBITAL  TUNNEL RELEASES;  Surgeon: [name], MD;  Location: [facility];  Service: Orthopedics;  Laterality: Right;  AXILLARY BLOCK   COLONOSCOPY  01/2007   Dr. [name], reviewed report, mild diverticulosis. Prep adequate. Next TCS due 01/2017   ESOPHAGOGASTRODUODENOSCOPY  N/A 10/21/2012   WNU:UVOZDGUY GASTRITIS   MASS EXCISION Right 10/26/2012   Procedure: EXCISION MASS;  Surgeon: [name], MD;  Location: [facility];  Service: General;  Laterality: Right;   PERIPHERAL VASCULAR INTERVENTION  08/22/2018   Procedure: PERIPHERAL VASCULAR INTERVENTION;  Surgeon: [name], MD;  Location: [facility] INVASIVE CV LAB;  Service: Cardiovascular;;  bilateral external iliac stents   scalp mass  4-5 yrs ago   Dr Angelique Holm NERVE TRANSPOSITION Right 03/21/2019   Procedure: DECOMPRESSION RIGHT ULNAR NERVE;  Surgeon: [name], MD;  Location: [facility];  Service: Orthopedics;  Laterality: Right;    Family History  Problem Relation Age of Onset   Diabetes Mother    Diabetes Father    Diabetes Brother    Colon cancer Neg Hx    Liver disease Neg Hx     Social History   Socioeconomic History   Marital status: Married    Spouse name: Not on file   Number of children: 2   Years of education: Not on file   Highest education level: Not on file  Occupational History   Occupation: reitred    Comment: [employer], heavy lifting  Tobacco Use   Smoking status: Every Day    Current packs/day: 0.50    Average packs/day: 0.5 packs/day for 35.0 years (17.5 ttl pk-yrs)    Types: Cigarettes   Smokeless tobacco:  Never  Vaping Use   Vaping status: Never Used  Substance and Sexual Activity   Alcohol use: No    Comment: hx heavy etoh x 3 yrs, quit 30+ yrs ago   Drug use: No   Sexual activity: Yes    Birth control/protection: None  Other Topics Concern   Not on file  Social History Narrative   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 Highest level of education: 12th grade. Married for 12 years. Has 2 sons (33 and 21). Lives with wife.   Social Determinants of Health   Financial Resource Strain: Not on file  Food Insecurity: Not on file  Transportation Needs: Not on file  Physical Activity: Not on file  Stress: Not on file  Social Connections: Not  on file  Intimate Partner Violence: Not on file    Outpatient Medications Prior to Visit  Medication Sig Dispense Refill   albuterol (VENTOLIN HFA) 108 (90 Base) MCG/ACT inhaler Inhale 2 puffs into the lungs every 6 (six) hours as needed for wheezing or shortness of breath. 20.1 g 0   alfuzosin (UROXATRAL) 10 MG 24 hr tablet Take 1 tablet (10 mg total) by mouth at bedtime. 30 tablet 11   amLODipine (NORVASC) 10 MG tablet Take 1 tablet (10 mg total) by mouth daily. 90 tablet 3   aspirin EC 81 MG tablet Take 1 tablet (81 mg total) by mouth daily with breakfast. 30 tablet 11   atorvastatin (LIPITOR) 10 MG tablet Take 1 tablet (10 mg total) by mouth daily. 90 tablet 3   benzonatate (TESSALON) 200 MG capsule Take 1 capsule (200 mg total) by mouth 2 (two) times daily as needed for cough. 30 capsule 0   cetirizine (ZYRTEC) 10 MG tablet Take 1 tablet (10 mg total) by mouth daily. 30 tablet 0   Cholecalciferol (VITAMIN D) 125 MCG (5000 UT) CAPS Take 2,000 Units by mouth daily. 30 capsule 3   cloNIDine (CATAPRES) 0.1 MG tablet Take 1 tablet (0.1 mg total) by mouth 2 (two) times daily. 180 tablet 3   clopidogrel (PLAVIX) 75 MG tablet TAKE (1) TABLET BY MOUTH ONCE DAILY. 90 tablet 3   EPINEPHrine 0.3 mg/0.3 mL IJ SOAJ injection Inject 0.3 mg into the muscle as needed for anaphylaxis. 1 each 1   glucose blood test strip 1 each by Other route 2 (two) times daily. Use as instructed bid E11.65 Relion Premier 100 each 5   insulin glargine (LANTUS SOLOSTAR) 100 UNIT/ML Solostar Pen Inject 14 Units into the skin daily with breakfast. INJECT 14 UNITS UNDER THE SKIN AT  BREAKFAST 15 mL 0   Lancets MISC 1 each by Does not apply route 2 (two) times daily. E11.65 Relion Premier 100 each 5   RESTASIS 0.05 % ophthalmic emulsion Place 1 drop into both eyes 2 (two) times daily as needed.     sulfamethoxazole-trimethoprim (BACTRIM DS) 800-160 MG tablet Take 1 tablet by mouth every 12 (twelve) hours. 56 tablet 0   SURE  COMFORT PEN NEEDLES 32G X 4 MM MISC USE ONCE DAILY 100 each 3   traZODone (DESYREL) 100 MG tablet Take 1 tablet (100 mg total) by mouth daily with breakfast. 90 tablet 1   ciprofloxacin (CIPRO) 500 MG tablet Take 1 tablet (500 mg total) by mouth 2 (two) times daily. 20 tablet 0   hydrochlorothiazide (HYDRODIURIL) 25 MG tablet TAKE 1 TABLET BY MOUTH DAILY 30 tablet 3   hydrochlorothiazide (MICROZIDE) 12.5 MG capsule Take 1 capsule (12.5 mg total)  by mouth daily. 90 capsule 0   lisinopril (ZESTRIL) 40 MG tablet Take 1 tablet (40 mg total) by mouth daily. 30 tablet 6   mupirocin ointment (BACTROBAN) 2 % Apply 1 Application topically 2 (two) times daily. Over upper lip area. 22 g 0   cyclobenzaprine (FLEXERIL) 10 MG tablet Take 1 tablet (10 mg total) by mouth 2 (two) times daily as needed for muscle spasms. (Patient not taking: Reported on 01/18/2023) 30 tablet 0   promethazine-dextromethorphan (PROMETHAZINE-DM) 6.25-15 MG/5ML syrup Take 5 mLs by mouth 4 (four) times daily as needed for cough. (Patient not taking: Reported on 01/18/2023) 118 mL 0   No facility-administered medications prior to visit.    No Known Allergies  Review of Systems  Constitutional:  Negative for chills and fever.  HENT:  Negative for congestion, postnasal drip and sore throat.   Respiratory:  Negative for cough and shortness of breath.   Cardiovascular:  Negative for chest pain and palpitations.  Gastrointestinal:  Positive for constipation. Negative for diarrhea and vomiting.  Genitourinary:  Negative for dysuria and hematuria.       Right scrotal pain  Musculoskeletal:  Positive for back pain. Negative for neck pain and neck stiffness.  Skin:  Negative for rash.       Lip lesion  Neurological:  Negative for dizziness and weakness.  Psychiatric/Behavioral:  Positive for sleep disturbance. Negative for agitation and behavioral problems.        Objective:    Physical Exam Vitals reviewed.  Constitutional:       General: He is not in acute distress.    Appearance: He is not diaphoretic.  HENT:     Head: Normocephalic and atraumatic.     Nose: No congestion.     Mouth/Throat:     Lips: Lesions (Lip-erythema and swelling with whitish lesion and skin exfoliation - see image in Media section) present.     Mouth: Mucous membranes are moist.  Eyes:     General: No scleral icterus.    Extraocular Movements: Extraocular movements intact.  Cardiovascular:     Rate and Rhythm: Normal rate and regular rhythm.     Pulses: Normal pulses.     Heart sounds: Normal heart sounds. No murmur heard. Pulmonary:     Breath sounds: Normal breath sounds. No wheezing or rales.  Genitourinary:    Testes:        Right: Tenderness (Mild) present.  Musculoskeletal:     Cervical back: Neck supple. No tenderness.     Right lower leg: No edema.     Left lower leg: No edema.  Skin:    General: Skin is warm.     Findings: No rash.     Comments: Clubbing of fingernails  Neurological:     General: No focal deficit present.     Mental Status: He is alert and oriented to person, place, and time.  Psychiatric:        Mood and Affect: Mood normal.        Behavior: Behavior normal.     BP 121/65   Pulse 84   Ht 5\' 7"  (1.702 m)   Wt 125 lb 12.8 oz (57.1 kg)   SpO2 97%   BMI 19.70 kg/m  Wt Readings from Last 3 Encounters:  05/25/23 125 lb 12.8 oz (57.1 kg)  05/18/23 123 lb 6.4 oz (56 kg)  05/13/23 128 lb (58.1 kg)        Assessment & Plan:   Problem List  Items Addressed This Visit       Cardiovascular and Mediastinum   Essential hypertension    BP Readings from Last 1 Encounters:  05/25/23 121/65   Usually well-controlled with amlodipine 10 mg QD, lisinopril 40 mg QD, HCTZ 12.5 mg QD and clonidine 0.1 mg BID Due to lip swelling, concern for angioedema -switch from lisinopril to olmesartan 20 mg QD   Since he had dizziness, decreased dose of HCTZ to 12.5 mg QD in the last visit - needs to take only  half tablet of 25 mg Followed by cardiology Counseled for compliance with the medications Advised DASH diet and moderate exercise/walking, at least 150 mins/week      Relevant Medications   olmesartan (BENICAR) 20 MG tablet   hydrochlorothiazide (HYDRODIURIL) 25 MG tablet     Digestive   Thrush    Nystatin suspension -swish and spit prescribed      Relevant Medications   nystatin (MYCOSTATIN) 100000 UNIT/ML suspension   mupirocin ointment (BACTROBAN) 2 %   Actinic cheilitis    Lip lesion initially looked like actinic cheilitis Mupirocin ointment for bacterial Ppx Currently, there is concern for Stevens-Johnson syndrome as well -Depo-Medrol 40 mg IM today It is unclear whether he has reaction from Bactrim and Uroxatral because he had lip swelling and thrush like lesions at a lesser extent for last few months If persistent worsening, will have to stop Bactrim and Uroxatral  Urgent referral to dermatology      Relevant Medications   mupirocin ointment (BACTROBAN) 2 %   Other Relevant Orders   Ambulatory referral to Dermatology     Other   Lip swelling - Primary    Considering recurrent lip swelling, concern for angioedema Discontinue lisinopril Depo-Medrol 40 mg IM today      Relevant Orders   Ambulatory referral to Dermatology     Meds ordered this encounter  Medications   nystatin (MYCOSTATIN) 100000 UNIT/ML suspension    Sig: Take 5 mLs (500,000 Units total) by mouth 4 (four) times daily.    Dispense:  60 mL    Refill:  0   mupirocin ointment (BACTROBAN) 2 %    Sig: Apply 1 Application topically 2 (two) times daily. Over upper lip area.    Dispense:  22 g    Refill:  0   olmesartan (BENICAR) 20 MG tablet    Sig: Take 1 tablet (20 mg total) by mouth daily.    Dispense:  30 tablet    Refill:  1    Please discontinue Lisinopril.   methylPREDNISolone acetate (DEPO-MEDROL) injection 40 mg   hydrochlorothiazide (HYDRODIURIL) 25 MG tablet    Sig: Take 0.5  tablets (12.5 mg total) by mouth daily.     Anabel Halon, MD

## 2023-05-26 ENCOUNTER — Telehealth: Payer: Self-pay

## 2023-05-26 ENCOUNTER — Ambulatory Visit: Payer: Medicare Other | Admitting: Internal Medicine

## 2023-05-26 NOTE — Telephone Encounter (Signed)
Transition Care Management Follow-up Telephone Call Date of discharge and from where: 05/13/2023 [facility] How have you been since you were released from the hospital? Patient stated he is not feeling any better. Any questions or concerns? No  Items Reviewed: Did the pt receive and understand the discharge instructions provided? Yes  Medications obtained and verified? Yes  Other? No  Any new allergies since your discharge? No  Dietary orders reviewed? Yes Do you have support at home? Yes   Follow up appointments reviewed:  PCP Hospital f/u appt confirmed? Yes  Scheduled to see Rutwik K. Allena Katz, MD on 05/18/2023 @ [facility]. Specialist Hospital f/u appt confirmed? Yes  Scheduled to see Malen Gauze, MD on 05/19/2023 @ [facility] [place]. Are transportation arrangements needed? No  If their condition worsens, is the pt aware to call PCP or go to the Emergency Dept.? Yes Was the patient provided with contact information for the PCP's office or ED? Yes Was to pt encouraged to call back with questions or concerns? Yes  Lucielle Vokes [facility]  [facility], [facility] Guide Direct Dial: [phone]  Website: [email or web address]

## 2023-05-27 DIAGNOSIS — L258 Unspecified contact dermatitis due to other agents: Secondary | ICD-10-CM | POA: Diagnosis not present

## 2023-06-01 ENCOUNTER — Other Ambulatory Visit: Payer: Self-pay | Admitting: Internal Medicine

## 2023-06-07 ENCOUNTER — Other Ambulatory Visit: Payer: Self-pay | Admitting: "Endocrinology

## 2023-06-08 ENCOUNTER — Encounter: Payer: Self-pay | Admitting: Internal Medicine

## 2023-06-08 ENCOUNTER — Ambulatory Visit: Payer: Medicare Other | Admitting: Internal Medicine

## 2023-06-08 VITALS — BP 108/65 | HR 91 | Ht 67.0 in | Wt 124.4 lb

## 2023-06-08 DIAGNOSIS — E1169 Type 2 diabetes mellitus with other specified complication: Secondary | ICD-10-CM

## 2023-06-08 DIAGNOSIS — R5382 Chronic fatigue, unspecified: Secondary | ICD-10-CM

## 2023-06-08 DIAGNOSIS — I10 Essential (primary) hypertension: Secondary | ICD-10-CM | POA: Diagnosis not present

## 2023-06-08 DIAGNOSIS — Z23 Encounter for immunization: Secondary | ICD-10-CM

## 2023-06-08 DIAGNOSIS — R220 Localized swelling, mass and lump, head: Secondary | ICD-10-CM | POA: Diagnosis not present

## 2023-06-08 NOTE — Patient Instructions (Signed)
Please stop taking hydrochlorothiazide.  Please continue taking Lisinopril as prescribed as it was not the reason for lip swelling.  Please continue to take medications as prescribed.  Please continue to follow low salt diet and ambulate as tolerated.

## 2023-06-09 ENCOUNTER — Emergency Department (HOSPITAL_COMMUNITY): Payer: Medicare Other

## 2023-06-09 ENCOUNTER — Encounter (HOSPITAL_COMMUNITY): Payer: Self-pay | Admitting: *Deleted

## 2023-06-09 ENCOUNTER — Other Ambulatory Visit: Payer: Self-pay

## 2023-06-09 ENCOUNTER — Inpatient Hospital Stay (HOSPITAL_COMMUNITY)
Admission: EM | Admit: 2023-06-09 | Discharge: 2023-06-14 | DRG: 644 | Disposition: A | Discharge status: Home / Self Care | Payer: Medicare Other | Attending: Family Medicine | Admitting: Family Medicine

## 2023-06-09 DIAGNOSIS — C61 Malignant neoplasm of prostate: Secondary | ICD-10-CM | POA: Diagnosis not present

## 2023-06-09 DIAGNOSIS — E1151 Type 2 diabetes mellitus with diabetic peripheral angiopathy without gangrene: Secondary | ICD-10-CM | POA: Diagnosis not present

## 2023-06-09 DIAGNOSIS — T502X5A Adverse effect of carbonic-anhydrase inhibitors, benzothiadiazides and other diuretics, initial encounter: Secondary | ICD-10-CM | POA: Diagnosis present

## 2023-06-09 DIAGNOSIS — R3912 Poor urinary stream: Secondary | ICD-10-CM | POA: Diagnosis not present

## 2023-06-09 DIAGNOSIS — D649 Anemia, unspecified: Secondary | ICD-10-CM | POA: Diagnosis present

## 2023-06-09 DIAGNOSIS — D631 Anemia in chronic kidney disease: Secondary | ICD-10-CM | POA: Diagnosis present

## 2023-06-09 DIAGNOSIS — G4700 Insomnia, unspecified: Secondary | ICD-10-CM

## 2023-06-09 DIAGNOSIS — R5382 Chronic fatigue, unspecified: Secondary | ICD-10-CM | POA: Diagnosis not present

## 2023-06-09 DIAGNOSIS — N401 Enlarged prostate with lower urinary tract symptoms: Secondary | ICD-10-CM | POA: Diagnosis present

## 2023-06-09 DIAGNOSIS — Z95820 Peripheral vascular angioplasty status with implants and grafts: Secondary | ICD-10-CM

## 2023-06-09 DIAGNOSIS — K760 Fatty (change of) liver, not elsewhere classified: Secondary | ICD-10-CM | POA: Diagnosis not present

## 2023-06-09 DIAGNOSIS — I708 Atherosclerosis of other arteries: Secondary | ICD-10-CM | POA: Diagnosis not present

## 2023-06-09 DIAGNOSIS — N1832 Chronic kidney disease, stage 3b: Secondary | ICD-10-CM | POA: Diagnosis present

## 2023-06-09 DIAGNOSIS — E782 Mixed hyperlipidemia: Secondary | ICD-10-CM | POA: Diagnosis not present

## 2023-06-09 DIAGNOSIS — N179 Acute kidney failure, unspecified: Secondary | ICD-10-CM | POA: Diagnosis not present

## 2023-06-09 DIAGNOSIS — Z794 Long term (current) use of insulin: Secondary | ICD-10-CM | POA: Diagnosis not present

## 2023-06-09 DIAGNOSIS — E222 Syndrome of inappropriate secretion of antidiuretic hormone: Secondary | ICD-10-CM | POA: Diagnosis not present

## 2023-06-09 DIAGNOSIS — E1122 Type 2 diabetes mellitus with diabetic chronic kidney disease: Secondary | ICD-10-CM | POA: Diagnosis not present

## 2023-06-09 DIAGNOSIS — R634 Abnormal weight loss: Secondary | ICD-10-CM | POA: Diagnosis present

## 2023-06-09 DIAGNOSIS — I129 Hypertensive chronic kidney disease with stage 1 through stage 4 chronic kidney disease, or unspecified chronic kidney disease: Secondary | ICD-10-CM | POA: Diagnosis not present

## 2023-06-09 DIAGNOSIS — J432 Centrilobular emphysema: Secondary | ICD-10-CM | POA: Diagnosis present

## 2023-06-09 DIAGNOSIS — R630 Anorexia: Secondary | ICD-10-CM | POA: Diagnosis present

## 2023-06-09 DIAGNOSIS — I739 Peripheral vascular disease, unspecified: Secondary | ICD-10-CM | POA: Diagnosis not present

## 2023-06-09 DIAGNOSIS — E860 Dehydration: Secondary | ICD-10-CM | POA: Diagnosis not present

## 2023-06-09 DIAGNOSIS — E1169 Type 2 diabetes mellitus with other specified complication: Secondary | ICD-10-CM | POA: Diagnosis not present

## 2023-06-09 DIAGNOSIS — E1165 Type 2 diabetes mellitus with hyperglycemia: Secondary | ICD-10-CM | POA: Diagnosis present

## 2023-06-09 DIAGNOSIS — F17200 Nicotine dependence, unspecified, uncomplicated: Secondary | ICD-10-CM | POA: Diagnosis present

## 2023-06-09 DIAGNOSIS — I2510 Atherosclerotic heart disease of native coronary artery without angina pectoris: Secondary | ICD-10-CM | POA: Diagnosis present

## 2023-06-09 DIAGNOSIS — R55 Syncope and collapse: Secondary | ICD-10-CM | POA: Diagnosis not present

## 2023-06-09 DIAGNOSIS — Z7982 Long term (current) use of aspirin: Secondary | ICD-10-CM

## 2023-06-09 DIAGNOSIS — I959 Hypotension, unspecified: Secondary | ICD-10-CM | POA: Diagnosis not present

## 2023-06-09 DIAGNOSIS — Z7902 Long term (current) use of antithrombotics/antiplatelets: Secondary | ICD-10-CM

## 2023-06-09 DIAGNOSIS — N400 Enlarged prostate without lower urinary tract symptoms: Secondary | ICD-10-CM | POA: Diagnosis present

## 2023-06-09 DIAGNOSIS — F17210 Nicotine dependence, cigarettes, uncomplicated: Secondary | ICD-10-CM | POA: Diagnosis present

## 2023-06-09 DIAGNOSIS — K219 Gastro-esophageal reflux disease without esophagitis: Secondary | ICD-10-CM | POA: Diagnosis not present

## 2023-06-09 DIAGNOSIS — R61 Generalized hyperhidrosis: Secondary | ICD-10-CM | POA: Diagnosis not present

## 2023-06-09 DIAGNOSIS — Z681 Body mass index (BMI) 19 or less, adult: Secondary | ICD-10-CM | POA: Diagnosis not present

## 2023-06-09 DIAGNOSIS — Z833 Family history of diabetes mellitus: Secondary | ICD-10-CM

## 2023-06-09 DIAGNOSIS — N4289 Other specified disorders of prostate: Secondary | ICD-10-CM | POA: Diagnosis not present

## 2023-06-09 DIAGNOSIS — I10 Essential (primary) hypertension: Secondary | ICD-10-CM | POA: Diagnosis present

## 2023-06-09 DIAGNOSIS — N281 Cyst of kidney, acquired: Secondary | ICD-10-CM | POA: Diagnosis not present

## 2023-06-09 DIAGNOSIS — R64 Cachexia: Secondary | ICD-10-CM | POA: Diagnosis present

## 2023-06-09 DIAGNOSIS — E871 Hypo-osmolality and hyponatremia: Secondary | ICD-10-CM | POA: Diagnosis not present

## 2023-06-09 DIAGNOSIS — Z79899 Other long term (current) drug therapy: Secondary | ICD-10-CM

## 2023-06-09 DIAGNOSIS — N4889 Other specified disorders of penis: Secondary | ICD-10-CM | POA: Diagnosis not present

## 2023-06-09 DIAGNOSIS — R739 Hyperglycemia, unspecified: Secondary | ICD-10-CM | POA: Diagnosis present

## 2023-06-09 DIAGNOSIS — R0602 Shortness of breath: Secondary | ICD-10-CM | POA: Diagnosis not present

## 2023-06-09 DIAGNOSIS — R109 Unspecified abdominal pain: Secondary | ICD-10-CM | POA: Diagnosis not present

## 2023-06-09 DIAGNOSIS — M79605 Pain in left leg: Secondary | ICD-10-CM | POA: Diagnosis not present

## 2023-06-09 DIAGNOSIS — R4182 Altered mental status, unspecified: Secondary | ICD-10-CM | POA: Diagnosis not present

## 2023-06-09 DIAGNOSIS — M79604 Pain in right leg: Secondary | ICD-10-CM | POA: Diagnosis not present

## 2023-06-09 DIAGNOSIS — R32 Unspecified urinary incontinence: Secondary | ICD-10-CM | POA: Diagnosis not present

## 2023-06-09 LAB — OSMOLALITY, URINE: Osmolality, Ur: 248 mOsm/kg — ABNORMAL LOW (ref 300–900)

## 2023-06-09 LAB — SEDIMENTATION RATE: Sed Rate: 30 mm/hr — ABNORMAL HIGH (ref 0–16)

## 2023-06-09 LAB — URINALYSIS, ROUTINE W REFLEX MICROSCOPIC
Bilirubin Urine: NEGATIVE
Glucose, UA: NEGATIVE mg/dL
Hgb urine dipstick: NEGATIVE
Ketones, ur: NEGATIVE mg/dL
Leukocytes,Ua: NEGATIVE
Nitrite: NEGATIVE
Protein, ur: NEGATIVE mg/dL
Specific Gravity, Urine: 1.014 (ref 1.005–1.030)
pH: 7 (ref 5.0–8.0)

## 2023-06-09 LAB — COMPREHENSIVE METABOLIC PANEL
ALT: 20 U/L (ref 0–44)
AST: 19 U/L (ref 15–41)
Albumin: 4.3 g/dL (ref 3.5–5.0)
Alkaline Phosphatase: 98 U/L (ref 38–126)
Anion gap: 14 (ref 5–15)
BUN: 17 mg/dL (ref 8–23)
CO2: 20 mmol/L — ABNORMAL LOW (ref 22–32)
Calcium: 9 mg/dL (ref 8.9–10.3)
Chloride: 85 mmol/L — ABNORMAL LOW (ref 98–111)
Creatinine, Ser: 1.69 mg/dL — ABNORMAL HIGH (ref 0.61–1.24)
GFR, Estimated: 43 mL/min — ABNORMAL LOW (ref >60)
Glucose, Bld: 187 mg/dL — ABNORMAL HIGH (ref 70–99)
Potassium: 4.6 mmol/L (ref 3.5–5.1)
Sodium: 119 mmol/L — CL (ref 135–145)
Total Bilirubin: 0.6 mg/dL (ref 0.3–1.2)
Total Protein: 7.9 g/dL (ref 6.5–8.1)

## 2023-06-09 LAB — CBC WITH DIFFERENTIAL/PLATELET
Abs Immature Granulocytes: 0.02 10*3/uL (ref 0.00–0.07)
Basophils Absolute: 0.1 10*3/uL (ref 0.0–0.1)
Basophils Relative: 2 %
Eosinophils Absolute: 0 10*3/uL (ref 0.0–0.5)
Eosinophils Relative: 0 %
HCT: 35.8 % — ABNORMAL LOW (ref 39.0–52.0)
Hemoglobin: 12.1 g/dL — ABNORMAL LOW (ref 13.0–17.0)
Immature Granulocytes: 0 %
Lymphocytes Relative: 12 %
Lymphs Abs: 0.5 10*3/uL — ABNORMAL LOW (ref 0.7–4.0)
MCH: 31.6 pg (ref 26.0–34.0)
MCHC: 33.8 g/dL (ref 30.0–36.0)
MCV: 93.5 fL (ref 80.0–100.0)
Monocytes Absolute: 0.6 10*3/uL (ref 0.1–1.0)
Monocytes Relative: 13 %
Neutro Abs: 3.3 10*3/uL (ref 1.7–7.7)
Neutrophils Relative %: 73 %
Platelets: 207 10*3/uL (ref 150–400)
RBC: 3.83 MIL/uL — ABNORMAL LOW (ref 4.22–5.81)
RDW: 13.5 % (ref 11.5–15.5)
WBC: 4.6 10*3/uL (ref 4.0–10.5)
nRBC: 0 % (ref 0.0–0.2)

## 2023-06-09 LAB — T4, FREE: Free T4: 0.99 ng/dL (ref 0.61–1.12)

## 2023-06-09 LAB — GLUCOSE, CAPILLARY
Glucose-Capillary: 121 mg/dL — ABNORMAL HIGH (ref 70–99)
Glucose-Capillary: 70 mg/dL (ref 70–99)

## 2023-06-09 LAB — NA AND K (SODIUM & POTASSIUM), RAND UR
Potassium Urine: 10 mmol/L
Sodium, Ur: 66 mmol/L

## 2023-06-09 LAB — OSMOLALITY: Osmolality: 266 mOsm/kg — ABNORMAL LOW (ref 275–295)

## 2023-06-09 LAB — LIPASE, BLOOD: Lipase: 26 U/L (ref 11–51)

## 2023-06-09 LAB — SODIUM
Sodium: 123 mmol/L — ABNORMAL LOW (ref 135–145)
Sodium: 121 mmol/L — ABNORMAL LOW (ref 135–145)
Sodium: 122 mmol/L — ABNORMAL LOW (ref 135–145)

## 2023-06-09 LAB — TSH: TSH: 0.555 u[IU]/mL (ref 0.350–4.500)

## 2023-06-09 LAB — PSA: Prostatic Specific Antigen: 2.28 ng/mL (ref 0.00–4.00)

## 2023-06-09 LAB — HEMOGLOBIN A1C
Hgb A1c MFr Bld: 6.3 % — ABNORMAL HIGH (ref 4.8–5.6)
Mean Plasma Glucose: 134.11 mg/dL

## 2023-06-09 LAB — C-REACTIVE PROTEIN: CRP: 1.9 mg/dL — ABNORMAL HIGH (ref <1.0)

## 2023-06-09 LAB — MRSA NEXT GEN BY PCR, NASAL: MRSA by PCR Next Gen: NOT DETECTED

## 2023-06-09 MED ORDER — CHLORHEXIDINE GLUCONATE CLOTH 2 % EX PADS
6.0000 | MEDICATED_PAD | Freq: Every day | CUTANEOUS | Status: DC
  Administered 2023-06-11 – 2023-06-13 (×3): 6 via TOPICAL

## 2023-06-09 MED ORDER — AMLODIPINE BESYLATE 5 MG PO TABS
5.0000 mg | ORAL_TABLET | Freq: Every day | ORAL | Status: DC
  Administered 2023-06-09 – 2023-06-11 (×3): 5 mg via ORAL
  Filled 2023-06-09 (×3): qty 1

## 2023-06-09 MED ORDER — INSULIN GLARGINE-YFGN 100 UNIT/ML ~~LOC~~ SOLN
10.0000 [IU] | Freq: Every day | SUBCUTANEOUS | Status: DC
  Administered 2023-06-09 – 2023-06-14 (×6): 10 [IU] via SUBCUTANEOUS
  Filled 2023-06-09 (×7): qty 0.1

## 2023-06-09 MED ORDER — IPRATROPIUM-ALBUTEROL 0.5-2.5 (3) MG/3ML IN SOLN: 3.0000 mL | RESPIRATORY_TRACT | Status: DC | PRN

## 2023-06-09 MED ORDER — SODIUM CHLORIDE 0.9 % IV SOLN: INTRAVENOUS | Status: DC

## 2023-06-09 MED ORDER — ONDANSETRON HCL 4 MG PO TABS: 4.0000 mg | ORAL_TABLET | Freq: Four times a day (QID) | ORAL | Status: DC | PRN

## 2023-06-09 MED ORDER — ALFUZOSIN HCL ER 10 MG PO TB24
10.0000 mg | ORAL_TABLET | Freq: Every day | ORAL | Status: DC
  Administered 2023-06-09 – 2023-06-13 (×5): 10 mg via ORAL
  Filled 2023-06-09 (×6): qty 1

## 2023-06-09 MED ORDER — TRAZODONE HCL 50 MG PO TABS
50.0000 mg | ORAL_TABLET | Freq: Every day | ORAL | Status: DC
  Administered 2023-06-09 – 2023-06-13 (×5): 50 mg via ORAL
  Filled 2023-06-09 (×5): qty 1

## 2023-06-09 MED ORDER — NICOTINE 21 MG/24HR TD PT24
21.0000 mg | MEDICATED_PATCH | Freq: Every day | TRANSDERMAL | Status: DC
  Administered 2023-06-09 – 2023-06-14 (×6): 21 mg via TRANSDERMAL
  Filled 2023-06-09 (×6): qty 1

## 2023-06-09 MED ORDER — HEPARIN SODIUM (PORCINE) 5000 UNIT/ML IJ SOLN
5000.0000 [IU] | Freq: Three times a day (TID) | INTRAMUSCULAR | Status: DC
  Filled 2023-06-09 (×2): qty 1

## 2023-06-09 MED ORDER — SODIUM CHLORIDE 0.9 % IV BOLUS
500.0000 mL | Freq: Once | INTRAVENOUS | Status: AC
  Administered 2023-06-09: 500 mL via INTRAVENOUS

## 2023-06-09 MED ORDER — ASPIRIN 81 MG PO TBEC
81.0000 mg | DELAYED_RELEASE_TABLET | Freq: Every day | ORAL | Status: DC
  Administered 2023-06-10 – 2023-06-14 (×5): 81 mg via ORAL
  Filled 2023-06-09 (×5): qty 1

## 2023-06-09 MED ORDER — FENTANYL CITRATE PF 50 MCG/ML IJ SOSY: 25.0000 ug | PREFILLED_SYRINGE | INTRAMUSCULAR | Status: DC | PRN

## 2023-06-09 MED ORDER — INSULIN ASPART 100 UNIT/ML IJ SOLN
3.0000 [IU] | Freq: Three times a day (TID) | INTRAMUSCULAR | Status: DC
  Administered 2023-06-09 – 2023-06-14 (×12): 3 [IU] via SUBCUTANEOUS

## 2023-06-09 MED ORDER — INSULIN ASPART 100 UNIT/ML IJ SOLN
0.0000 [IU] | Freq: Three times a day (TID) | INTRAMUSCULAR | Status: DC
  Administered 2023-06-09: 1 [IU] via SUBCUTANEOUS
  Administered 2023-06-10: 2 [IU] via SUBCUTANEOUS
  Administered 2023-06-10 – 2023-06-11 (×3): 1 [IU] via SUBCUTANEOUS
  Administered 2023-06-12: 3 [IU] via SUBCUTANEOUS
  Administered 2023-06-12: 1 [IU] via SUBCUTANEOUS
  Administered 2023-06-13 (×2): 2 [IU] via SUBCUTANEOUS
  Administered 2023-06-14: 3 [IU] via SUBCUTANEOUS
  Administered 2023-06-14: 1 [IU] via SUBCUTANEOUS

## 2023-06-09 MED ORDER — TRAZODONE HCL 50 MG PO TABS: 25.0000 mg | ORAL_TABLET | Freq: Every evening | ORAL | Status: DC | PRN

## 2023-06-09 MED ORDER — ONDANSETRON HCL 4 MG/2ML IJ SOLN
4.0000 mg | Freq: Four times a day (QID) | INTRAMUSCULAR | Status: DC | PRN
  Administered 2023-06-11: 4 mg via INTRAVENOUS
  Filled 2023-06-09: qty 2

## 2023-06-09 MED ORDER — OXYCODONE HCL 5 MG PO TABS: 5.0000 mg | ORAL_TABLET | ORAL | Status: DC | PRN

## 2023-06-09 MED ORDER — ACETAMINOPHEN 650 MG RE SUPP: 650.0000 mg | Freq: Four times a day (QID) | RECTAL | Status: DC | PRN

## 2023-06-09 MED ORDER — ATORVASTATIN CALCIUM 10 MG PO TABS
10.0000 mg | ORAL_TABLET | Freq: Every evening | ORAL | Status: DC
  Administered 2023-06-09 – 2023-06-14 (×6): 10 mg via ORAL
  Filled 2023-06-09 (×6): qty 1

## 2023-06-09 MED ORDER — IOHEXOL 300 MG/ML  SOLN
80.0000 mL | Freq: Once | INTRAMUSCULAR | Status: AC | PRN
  Administered 2023-06-09: 80 mL via INTRAVENOUS

## 2023-06-09 MED ORDER — ACETAMINOPHEN 325 MG PO TABS: 650.0000 mg | ORAL_TABLET | Freq: Four times a day (QID) | ORAL | Status: DC | PRN

## 2023-06-09 MED ORDER — BISACODYL 5 MG PO TBEC: 5.0000 mg | DELAYED_RELEASE_TABLET | Freq: Every day | ORAL | Status: DC | PRN

## 2023-06-09 NOTE — ED Notes (Signed)
Pt given urinal for UA sample.

## 2023-06-09 NOTE — Plan of Care (Signed)
Problem: Education: Goal: Knowledge of General Education information will improve Description: Including pain rating scale, medication(s)/side effects and non-pharmacologic comfort measures Outcome: Progressing   Problem: Clinical Measurements: Goal: Ability to maintain clinical measurements within normal limits will improve Outcome: Progressing Goal: Will remain free from infection Outcome: Progressing Goal: Cardiovascular complication will be avoided Outcome: Progressing   Problem: Nutrition: Goal: Adequate nutrition will be maintained Outcome: Progressing   Problem: Coping: Goal: Level of anxiety will decrease Outcome: Progressing   Problem: Elimination: Goal: Will not experience complications related to bowel motility Outcome: Progressing Goal: Will not experience complications related to urinary retention Outcome: Progressing   Problem: Pain Managment: Goal: General experience of comfort will improve Outcome: Progressing   Problem: Safety: Goal: Ability to remain free from injury will improve Outcome: Progressing   Problem: Skin Integrity: Goal: Risk for impaired skin integrity will decrease Outcome: Progressing

## 2023-06-09 NOTE — H&P (Addendum)
History and Physical  [facility]  SINCER COLLIE HEN:[id number] DOB: 1951/04/30 DOA: 06/09/2023  PCP: Anabel Halon, MD  Patient coming from: Home  Level of care: Stepdown  I have personally briefly reviewed patient's old medical records in [facility]  Chief Complaint: abnormal labs  HPI: Chase Briggs is a 72 year old male longtime active smoker with peripheral vascular disease status post bilateral external iliac stents, diabetes mellitus, GERD, hypertension, hyperlipidemia, chronic kidney disease stage IIIb, fatty liver, GERD, diverticulosis was sent by his primary care provider with concern for abnormal labs.  He was taken off of hydrochlorothiazide yesterday after learning his sodium was down to 117.  Patient says that he has been taking hydrochlorothiazide for many years but has not been having his labs checked regularly.  Patient reports that he has been feeling unwell for the past several months.  He has lost about 20 pounds over the last 2 months.  He has very poor appetite.  He has fatigue and malaise.  He has been having testicular pain and has been having nocturia.  He reports a weak urinary stream.  He saw urology earlier this month.  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  In ED patient noted to have sodium of 119.  CT abdomen demonstrated a rim-enhancing lesion involving the right prostate gland that was very suspicious concerning for malignancy versus abscess.  Urology was consulted.  Patient is being admitted for further management of severe hyponatremia and new findings of prostatic abnormality concerning for malignancy.     Past Medical History:  Diagnosis Date   Diabetes mellitus    Fatty liver    GERD (gastroesophageal reflux disease)    HTN (hypertension)    Hyperlipidemia    Insomnia    Renal insufficiency     Past  Surgical History:  Procedure Laterality Date   ABDOMINAL AORTOGRAM W/LOWER EXTREMITY N/A 08/22/2018   Procedure: ABDOMINAL AORTOGRAM W/LOWER EXTREMITY;  Surgeon: [name], MD;  Location: [facility] INVASIVE CV LAB;  Service: Cardiovascular;  Laterality: N/A;   BACK SURGERY     CARPAL TUNNEL WITH CUBITAL TUNNEL Right 03/21/2019   Procedure: RIGHT CARPAL TUNNEL AND CUBITAL  TUNNEL RELEASES;  Surgeon: [name], MD;  Location: [facility];  Service: Orthopedics;  Laterality: Right;  AXILLARY BLOCK   COLONOSCOPY  01/2007   Dr. [name], reviewed report, mild diverticulosis. Prep adequate. Next TCS due 01/2017   ESOPHAGOGASTRODUODENOSCOPY N/A 10/21/2012   TIR:[id number] GASTRITIS   MASS EXCISION Right 10/26/2012   Procedure: EXCISION MASS;  Surgeon: [name], MD;  Location: [facility];  Service: General;  Laterality: Right;   PERIPHERAL VASCULAR INTERVENTION  08/22/2018   Procedure: PERIPHERAL VASCULAR INTERVENTION;  Surgeon: [name], MD;  Location: [facility] INVASIVE CV LAB;  Service: Cardiovascular;;  bilateral external iliac stents   scalp mass  4-5 yrs ago   Dr Angelique Holm NERVE TRANSPOSITION Right 03/21/2019   Procedure: DECOMPRESSION RIGHT ULNAR NERVE;  Surgeon: [name], MD;  Location: [facility];  Service: Orthopedics;  Laterality: Right;     reports that he has been smoking cigarettes. He has a 17.5 pack-year smoking history. He has never used smokeless tobacco. He reports that he does not drink alcohol and does not use drugs.  No Known Allergies  Family History  Problem Relation Age of Onset  Diabetes Mother    Diabetes Father    Diabetes Brother    Colon cancer Neg Hx    Liver disease Neg Hx     Prior to Admission medications   Medication Sig Start Date End Date Taking? Authorizing Provider  albuterol (VENTOLIN HFA) 108 (90 Base) MCG/ACT inhaler Inhale 2 puffs into the lungs every 6 (six) hours as needed for wheezing or  shortness of breath. 04/06/23   Anabel Halon, MD  alfuzosin (UROXATRAL) 10 MG 24 hr tablet Take 1 tablet (10 mg total) by mouth at bedtime. 05/19/23   McKenzie, Mardene Celeste, MD  amLODipine (NORVASC) 10 MG tablet Take 1 tablet (10 mg total) by mouth daily. 04/06/23   Anabel Halon, MD  aspirin EC 81 MG tablet Take 1 tablet (81 mg total) by mouth daily with breakfast. 04/23/22   Shon Hale, MD  atorvastatin (LIPITOR) 10 MG tablet Take 1 tablet (10 mg total) by mouth daily. 04/06/23   Anabel Halon, MD  benzonatate (TESSALON) 200 MG capsule Take 1 capsule (200 mg total) by mouth 2 (two) times daily as needed for cough. 01/20/23   Anabel Halon, MD  cetirizine (ZYRTEC) 10 MG tablet Take 1 tablet (10 mg total) by mouth daily. 04/06/23   Anabel Halon, MD  Cholecalciferol (VITAMIN D) 125 MCG (5000 UT) CAPS Take 2,000 Units by mouth daily. 04/23/22   Shon Hale, MD  cloNIDine (CATAPRES) 0.1 MG tablet Take 1 tablet (0.1 mg total) by mouth 2 (two) times daily. 04/06/23   Anabel Halon, MD  clopidogrel (PLAVIX) 75 MG tablet TAKE (1) TABLET BY MOUTH ONCE DAILY. 04/06/23   Anabel Halon, MD  cyclobenzaprine (FLEXERIL) 10 MG tablet Take 1 tablet (10 mg total) by mouth 2 (two) times daily as needed for muscle spasms. Patient not taking: Reported on 01/18/2023 09/15/22   Anabel Halon, MD  EPINEPHrine 0.3 mg/0.3 mL IJ SOAJ injection Inject 0.3 mg into the muscle as needed for anaphylaxis. 02/10/23   Gardenia Phlegm, MD  glucose blood test strip 1 each by Other route 2 (two) times daily. Use as instructed bid E11.65 Relion Premier 05/15/19   Roma Kayser, MD  Lancets MISC 1 each by Does not apply route 2 (two) times daily. E11.65 Relion Premier 05/15/19   Roma Kayser, MD  LANTUS SOLOSTAR 100 UNIT/ML Solostar Pen INJECT 14 UNITS UNDER THE SKIN AT BEDTIME 06/07/23   Roma Kayser, MD  lisinopril (ZESTRIL) 40 MG tablet Take 40 mg by mouth daily.    [provider]  mupirocin  ointment (BACTROBAN) 2 % Apply 1 Application topically 2 (two) times daily. Over upper lip area. 05/25/23   Anabel Halon, MD  nystatin (MYCOSTATIN) 100000 UNIT/ML suspension Take 5 mLs (500,000 Units total) by mouth 4 (four) times daily. 05/25/23   Anabel Halon, MD  promethazine-dextromethorphan (PROMETHAZINE-DM) 6.25-15 MG/5ML syrup Take 5 mLs by mouth 4 (four) times daily as needed for cough. Patient not taking: Reported on 01/18/2023 12/30/22   Anabel Halon, MD  RESTASIS 0.05 % ophthalmic emulsion Place 1 drop into both eyes 2 (two) times daily as needed. 10/02/21   [provider]  sulfamethoxazole-trimethoprim (BACTRIM DS) 800-160 MG tablet Take 1 tablet by mouth every 12 (twelve) hours. 05/19/23   McKenzie, Mardene Celeste, MD  SURE COMFORT PEN NEEDLES 32G X 4 MM MISC USE ONCE DAILY 10/30/22   Roma Kayser, MD  traZODone (DESYREL) 100 MG tablet Take 1  tablet (100 mg total) by mouth daily with breakfast. 04/06/23   Anabel Halon, MD    Physical Exam: Vitals:   06/09/23 1200 06/09/23 1215 06/09/23 1230 06/09/23 1320  BP: 125/66   116/65  Pulse: 77 78 75   Resp: 17 17 17 20   Temp:      TempSrc:      SpO2: 96% 96% 92%   Weight:      Height:       Constitutional: chronically ill appearing male, emaciated, cachectic appearing, NAD, calm, comfortable Eyes: PERRL, lids and conjunctivae normal ENMT: Mucous membranes are pale, dry. Posterior pharynx clear of any exudate or lesions. Poor dentition.  Neck: normal, supple, no masses, no thyromegaly Respiratory: clear to auscultation bilaterally, no wheezing, no crackles. Normal respiratory effort. No accessory muscle use.  Cardiovascular: normal s1, s2 sounds, no murmurs / rubs / gallops. No extremity edema. 2+ pedal pulses. No carotid bruits.  Abdomen: no tenderness, no masses palpated. No hepatosplenomegaly. Bowel sounds positive.  Musculoskeletal: no clubbing / cyanosis. No joint deformity upper and lower extremities. Good ROM,  no contractures. Normal muscle tone.  Skin: no rashes, lesions, ulcers. No induration Neurologic: CN 2-12 grossly intact. Sensation intact, DTR normal. Strength 5/5 in all 4.  Psychiatric: Normal judgment and insight. Alert and oriented x 3. Normal mood.   Labs on Admission: I have personally reviewed following labs and imaging studies  CBC: Recent Labs  Lab 06/08/23 1132 06/09/23 0854  WBC 6.4 4.6  NEUTROABS 4.4 3.3  HGB 11.8* 12.1*  HCT 36.3* 35.8*  MCV 99* 93.5  PLT 241 207   Basic Metabolic Panel: Recent Labs  Lab 06/08/23 1132 06/09/23 0854  NA 117* 119*  K 5.7* 4.6  CL 84* 85*  CO2 20 20*  GLUCOSE 202* 187*  BUN 16 17  CREATININE 1.58* 1.69*  CALCIUM 9.1 9.0   GFR: Estimated Creatinine Clearance: 31.9 mL/min (A) (by C-G formula based on SCr of 1.69 mg/dL (H)). Liver Function Tests: Recent Labs  Lab 06/08/23 1132 06/09/23 0854  AST 16 19  ALT 15 20  ALKPHOS 115 98  BILITOT 0.3 0.6  PROT 7.4 7.9  ALBUMIN 4.3 4.3   Recent Labs  Lab 06/09/23 0932  LIPASE 26   No results for input(s): "AMMONIA" in the last 168 hours. Coagulation Profile: No results for input(s): "INR", "PROTIME" in the last 168 hours. Cardiac Enzymes: No results for input(s): "CKTOTAL", "CKMB", "CKMBINDEX", "TROPONINI" in the last 168 hours. BNP (last 3 results) No results for input(s): "PROBNP" in the last 8760 hours. HbA1C: No results for input(s): "HGBA1C" in the last 72 hours. CBG: No results for input(s): "GLUCAP" in the last 168 hours. Lipid Profile: No results for input(s): "CHOL", "HDL", "LDLCALC", "TRIG", "CHOLHDL", "LDLDIRECT" in the last 72 hours. Thyroid Function Tests: Recent Labs    06/09/23 0932  TSH 0.555  FREET4 0.99   Anemia Panel: Recent Labs    06/08/23 1132  VITAMINB12 567   Urine analysis:    Component Value Date/Time   COLORURINE STRAW (A) 06/09/2023 1030   APPEARANCEUR CLEAR 06/09/2023 1030   APPEARANCEUR Clear 05/19/2023 1408   LABSPEC 1.014  06/09/2023 1030   PHURINE 7.0 06/09/2023 1030   GLUCOSEU NEGATIVE 06/09/2023 1030   HGBUR NEGATIVE 06/09/2023 1030   BILIRUBINUR NEGATIVE 06/09/2023 1030   BILIRUBINUR Negative 05/19/2023 1408   KETONESUR NEGATIVE 06/09/2023 1030   PROTEINUR NEGATIVE 06/09/2023 1030   UROBILINOGEN 1.0 03/06/2022 0829   UROBILINOGEN 0.2 02/19/2014 1340  NITRITE NEGATIVE 06/09/2023 1030   LEUKOCYTESUR NEGATIVE 06/09/2023 1030    Radiological Exams on Admission: CT ABDOMEN PELVIS W CONTRAST  Result Date: 06/09/2023 CLINICAL DATA:  Nonlocalized abdominal pain and weight loss. EXAM: CT ABDOMEN AND PELVIS WITH CONTRAST TECHNIQUE: Multidetector CT imaging of the abdomen and pelvis was performed using the standard protocol following bolus administration of intravenous contrast. RADIATION DOSE REDUCTION: This exam was performed according to the departmental dose-optimization program which includes automated exposure control, adjustment of the mA and/or kV according to patient size and/or use of iterative reconstruction technique. CONTRAST:  80mL OMNIPAQUE IOHEXOL 300 MG/ML  SOLN COMPARISON:  01/28/2023 FINDINGS: Lower chest: No acute findings. Hepatobiliary: No suspicious focal abnormality within the liver parenchyma. Small area of low attenuation in the anterior liver, adjacent to the falciform ligament, is in a characteristic location for focal fatty deposition. Gallbladder is nondistended. No intrahepatic or extrahepatic biliary dilation. Pancreas: No focal mass lesion. No dilatation of the main duct. No intraparenchymal cyst. No peripancreatic edema. Spleen: No splenomegaly. No suspicious focal mass lesion. Adrenals/Urinary Tract: No adrenal nodule or mass. Tiny cyst noted lower pole left kidney. Right kidney unremarkable. No evidence for hydroureter. The urinary bladder appears normal for the degree of distention. Stomach/Bowel: Stomach is unremarkable. No gastric wall thickening. No evidence of outlet obstruction.  Duodenum is normally positioned as is the ligament of Treitz. No small bowel wall thickening. No small bowel dilatation. The terminal ileum is normal. The appendix is normal. No gross colonic mass. No colonic wall thickening. Vascular/Lymphatic: There is moderate atherosclerotic calcification of the abdominal aorta without aneurysm. Advanced atherosclerotic disease noted in the common and external iliac arteries bilaterally with evidence of bilateral external iliac artery stent placement. There is no gastrohepatic or hepatoduodenal ligament lymphadenopathy. No retroperitoneal or mesenteric lymphadenopathy. No pelvic sidewall lymphadenopathy. Reproductive: Prostate gland is mildly enlarged and heterogeneous. There is a rim enhancing collection immediately adjacent to or potentially involving the parenchyma of the posterior and superior aspect of the right prostate gland. This obscures the right seminal vesicle and tracks posterolaterally in the right pelvic floor measuring on the order of 5.3 x 4.9 x 4.3 cm (image 62/2). This lesion is contiguous with the anterior wall of the low rectum. Other: No intraperitoneal free fluid. Musculoskeletal: No worrisome lytic or sclerotic osseous abnormality. IMPRESSION: 1. 5.3 x 4.9 x 4.3 cm rim enhancing collection immediately adjacent to or potentially involving the parenchyma of the posterior and superior aspect of the right prostate gland. This obscures the right seminal vesicle and tracks posterolaterally in the right pelvic floor. This lesion is contiguous with the anterior wall of the low rectum. Imaging features could be compatible with an abscess, potentially arising from the posterior prostate gland or right seminal vesicle. Rectal origin not excluded. Necrotic neoplasm in the central pelvis/pelvic floor would also be a consideration. 2.  Aortic Atherosclerosis (ICD10-I70.0). Electronically Signed   By: Kennith Center M.D.   On: 06/09/2023 12:00    EKG: Independently  reviewed. NSR with PACs  Assessment/Plan Principal Problem:   Hyponatremia Active Problems:   Essential hypertension   Fatty liver   Type 2 diabetes mellitus with other specified complication (HCC)   Mixed hyperlipidemia   Current smoker   PAD (peripheral artery disease) (HCC)   Centrilobular emphysema (HCC)   Insulin long-term use (HCC)   Prostate mass   Abnormal weight loss   Poor appetite   Stage 3b chronic kidney disease (CKD) (HCC)   Hyperglycemia   Normocytic anemia  Severe Hyponatremia - Pt is symptomatic and presents with Na of 119 - plan for slow correction over next couple of days - suspect secondary to hydrochlorothiazide and possible SIADH of malignancy - continue IV NS infusion 70 mL/hr with sodium testing every 2 hours x 2, then every 4 hours - no need for rapid sodium correction at this time - follow closely in stepdown ICU setting  - follow up urine and serum osmolality, urine lytes, TSH  Essential hypertension  - hydrochlorothiazide has been discontinued  - ARB was discontinued recently due to lip swelling symptoms - BP is soft at this time - follow BP and hopefully will improve with hydration   Prostate mass/lesion  - highly suspicious for malignancy vs abscess  - add on CRP, sed rate  - add on PSA  - appreciate urology consultation, waiting on recommendations - discussed CT findings with patient at bedside  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 - follow up A1c - resume home basal insulin when meds reconciled  - monitor CBG carefully, 5x per day - add prandial coverage   PVD s/p iliac stent placement - temporarily holding plavix until urology can see him in case he needs procedures  Stage 3b CKD - follow renal function with hydration - renally dose medication as needed  Tobacco use with COPD  - nicotine patch was ordered;  - counseled patient to stop all tobacco use especially given new CT findings - nursing  to provide further tobacco cessation information - bronchodilators ordered as needed   Abnormal weight loss - reports unintentional 20 pound weight loss - prostate cancer work up per urology  - follow up PSA - nutritional supplements ordered  Critical Care Procedure Note Authorized and Performed by: Maryln Manuel MD  Total Critical Care time:  73 mins Due to a high probability of clinically significant, life threatening deterioration, the patient required my highest level of preparedness to intervene emergently and I personally spent this critical care time directly and personally managing the patient.  This critical care time included obtaining a history; examining the patient, pulse oximetry; ordering and review of studies; arranging urgent treatment with development of a management plan; evaluation of patient's response of treatment; frequent reassessment; and discussions with other providers.  This critical care time was performed to assess and manage the high probability of imminent and life threatening deterioration that could result in multi-organ failure.  It was exclusive of separately billable procedures and treating other patients and teaching time.   DVT prophylaxis: SQ heparin   Code Status: Full  Family Communication: patient   Disposition Plan: TBD   Consults called: urology   Admission status: INP  Level of care: Stepdown Standley Dakins MD [facility] How to contact the [facility] Attending or Consulting provider 7A - 7P or covering provider during after hours 7P -7A, for this patient?  Check the care team in [facility] and look for a) attending/consulting [facility] provider listed and b) the [facility] team listed Log into [email or web address] and use 's universal password to access. If you do not have the password, please contact the hospital operator. Locate the [facility] provider you are looking for under [facility] and page to a number that you can be directly reached. If you still  have difficulty reaching the provider, please page the [facility] (Director on Call) for the Hospitalists listed on amion for assistance.   If 7PM-7AM, please contact night-coverage [email or web address] Password [facility]  06/09/2023, 2:31 PM

## 2023-06-09 NOTE — ED Provider Notes (Signed)
[facility] Provider Note  CSN: [id number] Arrival date & time: 06/09/23 4098  Chief Complaint(s) Abnormal Labs  HPI CLAUD ISHEE is a 72 y.o. male with past medical history as below, significant for DM, GERD, hypertension, hyperlipidemia who presents to the ED with complaint of low sodium   Patient was seen at PCP yesterday, had outpatient labs performed which revealed a sodium of 117 and was advised to come to the ER for evaluation.  He has been on low-salt diet.  He was recently discontinued from hydrochlorothiazide (yestd).  He denies vomiting or diarrhea.  He reports very poor appetite over at least 1 month.  He tries to eat and gets full quickly and does not want any further.  Reports he is lost approximately 15 and 20 pounds over the past month.  No vomiting or significant nausea.  No change in bowel or bladder function, no melena or BRBPR.  No unusual rashes or lesions.  No chest pain or dyspnea.  He has some intermittent vague abdominal pain unchanged with p.o. intake.  Seen by PCP for hyponatremia  Past Medical History Past Medical History:  Diagnosis Date   Diabetes mellitus    Fatty liver    GERD (gastroesophageal reflux disease)    HTN (hypertension)    Hyperlipidemia    Insomnia    Renal insufficiency    Patient Active Problem List   Diagnosis Date Noted   Hyponatremia 06/09/2023   Prostate mass 06/09/2023   Abnormal weight loss 06/09/2023   Poor appetite 06/09/2023   Stage 3b chronic kidney disease (CKD) (HCC) 06/09/2023   Hyperglycemia 06/09/2023   Normocytic anemia 06/09/2023   Actinic cheilitis 05/19/2023   Epididymo-orchitis 04/27/2023   Insulin long-term use (HCC) 02/25/2023   Lip swelling 02/10/2023   Thrush 02/10/2023   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   Left inguinal hernia 01/20/2023   Encounter for examination following treatment at hospital 01/20/2023   Centrilobular emphysema (HCC) 11/26/2022   Lumbar herniated disc  09/15/2022   Bilateral sciatica 09/15/2022   Hospital discharge follow-up 04/30/2022   Atypical chest pain 04/23/2022   Current smoker 07/22/2021   Iron deficiency anemia 07/22/2021   PAD (peripheral artery disease) (HCC) 07/22/2021   Allergic rhinitis 07/22/2021   Insomnia 12/12/2019   Bilateral carpal tunnel syndrome 11/23/2018   Entrapment of right ulnar nerve 11/23/2018   Mixed hyperlipidemia 08/27/2015   Vitamin D deficiency 08/27/2015   Type 2 diabetes mellitus with other specified complication (HCC) 02/20/2014   Mild protein-calorie malnutrition (HCC) 02/20/2014   Fatty liver 09/21/2012   Essential hypertension 01/07/2011   Home Medication(s) Prior to Admission medications   Medication Sig Start Date End Date Taking? Authorizing Provider  albuterol (VENTOLIN HFA) 108 (90 Base) MCG/ACT inhaler Inhale 2 puffs into the lungs every 6 (six) hours as needed for wheezing or shortness of breath. 04/06/23   Anabel Halon, MD  alfuzosin (UROXATRAL) 10 MG 24 hr tablet Take 1 tablet (10 mg total) by mouth at bedtime. 05/19/23   McKenzie, Mardene Celeste, MD  amLODipine (NORVASC) 10 MG tablet Take 1 tablet (10 mg total) by mouth daily. 04/06/23   Anabel Halon, MD  aspirin EC 81 MG tablet Take 1 tablet (81 mg total) by mouth daily with breakfast. 04/23/22   Shon Hale, MD  atorvastatin (LIPITOR) 10 MG tablet Take 1 tablet (10 mg total) by mouth daily. 04/06/23   Anabel Halon, MD  benzonatate (TESSALON) 200 MG capsule Take 1 capsule (200  mg total) by mouth 2 (two) times daily as needed for cough. 01/20/23   Anabel Halon, MD  cetirizine (ZYRTEC) 10 MG tablet Take 1 tablet (10 mg total) by mouth daily. 04/06/23   Anabel Halon, MD  Cholecalciferol (VITAMIN D) 125 MCG (5000 UT) CAPS Take 2,000 Units by mouth daily. 04/23/22   Shon Hale, MD  cloNIDine (CATAPRES) 0.1 MG tablet Take 1 tablet (0.1 mg total) by mouth 2 (two) times daily. 04/06/23   Anabel Halon, MD  clopidogrel (PLAVIX) 75  MG tablet TAKE (1) TABLET BY MOUTH ONCE DAILY. 04/06/23   Anabel Halon, MD  cyclobenzaprine (FLEXERIL) 10 MG tablet Take 1 tablet (10 mg total) by mouth 2 (two) times daily as needed for muscle spasms. Patient not taking: Reported on 01/18/2023 09/15/22   Anabel Halon, MD  EPINEPHrine 0.3 mg/0.3 mL IJ SOAJ injection Inject 0.3 mg into the muscle as needed for anaphylaxis. 02/10/23   Gardenia Phlegm, MD  glucose blood test strip 1 each by Other route 2 (two) times daily. Use as instructed bid E11.65 Relion Premier 05/15/19   Roma Kayser, MD  Lancets MISC 1 each by Does not apply route 2 (two) times daily. E11.65 Relion Premier 05/15/19   Roma Kayser, MD  LANTUS SOLOSTAR 100 UNIT/ML Solostar Pen INJECT 14 UNITS UNDER THE SKIN AT BEDTIME 06/07/23   Roma Kayser, MD  lisinopril (ZESTRIL) 40 MG tablet Take 40 mg by mouth daily.    [provider]  mupirocin ointment (BACTROBAN) 2 % Apply 1 Application topically 2 (two) times daily. Over upper lip area. 05/25/23   Anabel Halon, MD  nystatin (MYCOSTATIN) 100000 UNIT/ML suspension Take 5 mLs (500,000 Units total) by mouth 4 (four) times daily. 05/25/23   Anabel Halon, MD  promethazine-dextromethorphan (PROMETHAZINE-DM) 6.25-15 MG/5ML syrup Take 5 mLs by mouth 4 (four) times daily as needed for cough. Patient not taking: Reported on 01/18/2023 12/30/22   Anabel Halon, MD  RESTASIS 0.05 % ophthalmic emulsion Place 1 drop into both eyes 2 (two) times daily as needed. 10/02/21   [provider]  sulfamethoxazole-trimethoprim (BACTRIM DS) 800-160 MG tablet Take 1 tablet by mouth every 12 (twelve) hours. 05/19/23   McKenzie, Mardene Celeste, MD  SURE COMFORT PEN NEEDLES 32G X 4 MM MISC USE ONCE DAILY 10/30/22   Roma Kayser, MD  traZODone (DESYREL) 100 MG tablet Take 1 tablet (100 mg total) by mouth daily with breakfast. 04/06/23   Anabel Halon, MD                                                                                                                                     Past Surgical History Past Surgical History:  Procedure Laterality Date   ABDOMINAL AORTOGRAM W/LOWER EXTREMITY N/A 08/22/2018   Procedure: ABDOMINAL AORTOGRAM W/LOWER EXTREMITY;  Surgeon: [name], MD;  Location: [facility]  INVASIVE CV LAB;  Service: Cardiovascular;  Laterality: N/A;   BACK SURGERY     CARPAL TUNNEL WITH CUBITAL TUNNEL Right 03/21/2019   Procedure: RIGHT CARPAL TUNNEL AND CUBITAL  TUNNEL RELEASES;  Surgeon: [name], MD;  Location: [facility];  Service: Orthopedics;  Laterality: Right;  AXILLARY BLOCK   COLONOSCOPY  01/2007   Dr. [name], reviewed report, mild diverticulosis. Prep adequate. Next TCS due 01/2017   ESOPHAGOGASTRODUODENOSCOPY N/A 10/21/2012   EVO:[id number] GASTRITIS   MASS EXCISION Right 10/26/2012   Procedure: EXCISION MASS;  Surgeon: [name], MD;  Location: [facility];  Service: General;  Laterality: Right;   PERIPHERAL VASCULAR INTERVENTION  08/22/2018   Procedure: PERIPHERAL VASCULAR INTERVENTION;  Surgeon: [name], MD;  Location: [facility] INVASIVE CV LAB;  Service: Cardiovascular;;  bilateral external iliac stents   scalp mass  4-5 yrs ago   Dr Angelique Holm NERVE TRANSPOSITION Right 03/21/2019   Procedure: DECOMPRESSION RIGHT ULNAR NERVE;  Surgeon: [name], MD;  Location: [facility];  Service: Orthopedics;  Laterality: Right;   Family History Family History  Problem Relation Age of Onset   Diabetes Mother    Diabetes Father    Diabetes Brother    Colon cancer Neg Hx    Liver disease Neg Hx     Social History Social History   Tobacco Use   Smoking status: Every Day    Current packs/day: 0.50    Average packs/day: 0.5 packs/day for 35.0 years (17.5 ttl pk-yrs)    Types: Cigarettes   Smokeless tobacco: Never  Vaping Use   Vaping status: Never Used  Substance Use Topics   Alcohol use: No    Comment: hx heavy etoh x 3 yrs, quit  30+ yrs ago   Drug use: No   Allergies Patient has no known allergies.  Review of Systems Review of Systems  Constitutional:  Positive for appetite change. Negative for chills and fever.  HENT:  Negative for trouble swallowing.   Respiratory:  Negative for chest tightness.   Cardiovascular:  Negative for chest pain.  Gastrointestinal:  Positive for abdominal pain. Negative for diarrhea and vomiting.  Genitourinary:  Negative for dysuria and urgency.  Musculoskeletal:  Negative for arthralgias and back pain.  Skin:  Negative for rash and wound.  All other systems reviewed and are negative.   Physical Exam Vital Signs  I have reviewed the triage vital signs BP 116/65   Pulse 75   Temp 98.5 F (36.9 C) (Oral)   Resp 20   Ht 5\' 7"  (1.702 m)   Wt 56.2 kg   SpO2 92%   BMI 19.42 kg/m  Physical Exam Vitals and nursing note reviewed.  Constitutional:      General: He is not in acute distress.    Appearance: He is well-developed.     Comments: Appears frail  HENT:     Head: Normocephalic and atraumatic.     Right Ear: External ear normal.     Left Ear: External ear normal.     Mouth/Throat:     Mouth: Mucous membranes are dry.  Eyes:     General: No scleral icterus. Cardiovascular:     Rate and Rhythm: Normal rate and regular rhythm.     Pulses: Normal pulses.     Heart sounds: Normal heart sounds.  Pulmonary:     Effort: Pulmonary effort is normal. No respiratory distress.     Breath sounds: Normal breath sounds.  Abdominal:     General: Abdomen is flat.     Palpations: Abdomen is soft.     Tenderness: There is no abdominal tenderness.  Musculoskeletal:     Cervical back: No rigidity.     Right lower leg: No edema.     Left lower leg: No edema.  Skin:    General: Skin is warm and dry.     Capillary Refill: Capillary refill takes less than 2 seconds.  Neurological:     Mental Status: He is alert and oriented to person, place, and time.  Psychiatric:         Mood and Affect: Mood normal.        Behavior: Behavior normal.     ED Results and Treatments Labs (all labs ordered are listed, but only abnormal results are displayed) Labs Reviewed  CBC WITH DIFFERENTIAL/PLATELET - Abnormal; Notable for the following components:      Result Value   RBC 3.83 (*)    Hemoglobin 12.1 (*)    HCT 35.8 (*)    Lymphs Abs 0.5 (*)    All other components within normal limits  COMPREHENSIVE METABOLIC PANEL - Abnormal; Notable for the following components:   Sodium 119 (*)    Chloride 85 (*)    CO2 20 (*)    Glucose, Bld 187 (*)    Creatinine, Ser 1.69 (*)    GFR, Estimated 43 (*)    All other components within normal limits  URINALYSIS, ROUTINE W REFLEX MICROSCOPIC - Abnormal; Notable for the following components:   Color, Urine STRAW (*)    All other components within normal limits  OSMOLALITY - Abnormal; Notable for the following components:   Osmolality 266 (*)    All other components within normal limits  SODIUM - Abnormal; Notable for the following components:   Sodium 123 (*)    All other components within normal limits  MRSA NEXT GEN BY PCR, NASAL  NA AND K (SODIUM & POTASSIUM), RAND UR  LIPASE, BLOOD  TSH  T4, FREE  OSMOLALITY, URINE  PSA  SEDIMENTATION RATE  C-REACTIVE PROTEIN  HEMOGLOBIN A1C  SODIUM  SODIUM  SODIUM  SODIUM                                                                                                                          Radiology CT ABDOMEN PELVIS W CONTRAST  Result Date: 06/09/2023 CLINICAL DATA:  Nonlocalized abdominal pain and weight loss. EXAM: CT ABDOMEN AND PELVIS WITH CONTRAST TECHNIQUE: Multidetector CT imaging of the abdomen and pelvis was performed using the standard protocol following bolus administration of intravenous contrast. RADIATION DOSE REDUCTION: This exam was performed according to the departmental dose-optimization program which includes automated exposure control, adjustment of the mA  and/or kV according to patient size and/or use of iterative reconstruction technique. CONTRAST:  80mL OMNIPAQUE IOHEXOL 300 MG/ML  SOLN COMPARISON:  01/28/2023 FINDINGS: Lower chest: No acute findings. Hepatobiliary: No suspicious focal abnormality within  the liver parenchyma. Small area of low attenuation in the anterior liver, adjacent to the falciform ligament, is in a characteristic location for focal fatty deposition. Gallbladder is nondistended. No intrahepatic or extrahepatic biliary dilation. Pancreas: No focal mass lesion. No dilatation of the main duct. No intraparenchymal cyst. No peripancreatic edema. Spleen: No splenomegaly. No suspicious focal mass lesion. Adrenals/Urinary Tract: No adrenal nodule or mass. Tiny cyst noted lower pole left kidney. Right kidney unremarkable. No evidence for hydroureter. The urinary bladder appears normal for the degree of distention. Stomach/Bowel: Stomach is unremarkable. No gastric wall thickening. No evidence of outlet obstruction. Duodenum is normally positioned as is the ligament of Treitz. No small bowel wall thickening. No small bowel dilatation. The terminal ileum is normal. The appendix is normal. No gross colonic mass. No colonic wall thickening. Vascular/Lymphatic: There is moderate atherosclerotic calcification of the abdominal aorta without aneurysm. Advanced atherosclerotic disease noted in the common and external iliac arteries bilaterally with evidence of bilateral external iliac artery stent placement. There is no gastrohepatic or hepatoduodenal ligament lymphadenopathy. No retroperitoneal or mesenteric lymphadenopathy. No pelvic sidewall lymphadenopathy. Reproductive: Prostate gland is mildly enlarged and heterogeneous. There is a rim enhancing collection immediately adjacent to or potentially involving the parenchyma of the posterior and superior aspect of the right prostate gland. This obscures the right seminal vesicle and tracks posterolaterally in  the right pelvic floor measuring on the order of 5.3 x 4.9 x 4.3 cm (image 62/2). This lesion is contiguous with the anterior wall of the low rectum. Other: No intraperitoneal free fluid. Musculoskeletal: No worrisome lytic or sclerotic osseous abnormality. IMPRESSION: 1. 5.3 x 4.9 x 4.3 cm rim enhancing collection immediately adjacent to or potentially involving the parenchyma of the posterior and superior aspect of the right prostate gland. This obscures the right seminal vesicle and tracks posterolaterally in the right pelvic floor. This lesion is contiguous with the anterior wall of the low rectum. Imaging features could be compatible with an abscess, potentially arising from the posterior prostate gland or right seminal vesicle. Rectal origin not excluded. Necrotic neoplasm in the central pelvis/pelvic floor would also be a consideration. 2.  Aortic Atherosclerosis (ICD10-I70.0). Electronically Signed   By: Kennith Center M.D.   On: 06/09/2023 12:00    Pertinent labs & imaging results that were available during my care of the patient were reviewed by me and considered in my medical decision making (see MDM for details).  Medications Ordered in ED Medications  insulin aspart (novoLOG) injection 0-9 Units (has no administration in time range)  insulin aspart (novoLOG) injection 3 Units (has no administration in time range)  heparin injection 5,000 Units (has no administration in time range)  0.9 %  sodium chloride infusion ( Intravenous New Bag/Given 06/09/23 1413)  acetaminophen (TYLENOL) tablet 650 mg (has no administration in time range)    Or  acetaminophen (TYLENOL) suppository 650 mg (has no administration in time range)  oxyCODONE (Oxy IR/ROXICODONE) immediate release tablet 5 mg (has no administration in time range)  fentaNYL (SUBLIMAZE) injection 25 mcg (has no administration in time range)  traZODone (DESYREL) tablet 25 mg (has no administration in time range)  bisacodyl (DULCOLAX) EC  tablet 5 mg (has no administration in time range)  ondansetron (ZOFRAN) tablet 4 mg (has no administration in time range)    Or  ondansetron (ZOFRAN) injection 4 mg (has no administration in time range)  ipratropium-albuterol (DUONEB) 0.5-2.5 (3) MG/3ML nebulizer solution 3 mL (has no administration in time range)  nicotine (NICODERM CQ - dosed in mg/24 hours) patch 21 mg (21 mg Transdermal Patch Applied 06/09/23 1410)  Chlorhexidine Gluconate Cloth 2 % PADS 6 each (has no administration in time range)  alfuzosin (UROXATRAL) 24 hr tablet 10 mg (has no administration in time range)  amLODipine (NORVASC) tablet 5 mg (has no administration in time range)  aspirin EC tablet 81 mg (has no administration in time range)  atorvastatin (LIPITOR) tablet 10 mg (has no administration in time range)  insulin glargine-yfgn (SEMGLEE) injection 10 Units (has no administration in time range)  traZODone (DESYREL) tablet 50 mg (has no administration in time range)  sodium chloride 0.9 % bolus 500 mL (0 mLs Intravenous Stopped 06/09/23 1033)  iohexol (OMNIPAQUE) 300 MG/ML solution 80 mL (80 mLs Intravenous Contrast Given 06/09/23 0949)                                                                                                                                     Procedures .Critical Care  Performed by: Sloan Leiter, DO Authorized by: Sloan Leiter, DO   Critical care provider statement:    Critical care time (minutes):  48   Critical care time was exclusive of:  Separately billable procedures and treating other patients   Critical care was necessary to treat or prevent imminent or life-threatening deterioration of the following conditions:  Metabolic crisis   Critical care was time spent personally by me on the following activities:  Development of treatment plan with patient or surrogate, discussions with consultants, evaluation of patient's response to treatment, examination of patient, ordering and  review of laboratory studies, ordering and review of radiographic studies, ordering and performing treatments and interventions, pulse oximetry, re-evaluation of patient's condition, review of old charts and obtaining history from patient or surrogate   Care discussed with: admitting provider     (including critical care time)  Medical Decision Making / ED Course    Medical Decision Making:    NYJEL GABINO is a 72 y.o. male with past medical history as below, significant for DM, GERD, hypertension, hyperlipidemia who presents to the ED with complaint of low sodium. The complaint involves an extensive differential diagnosis and also carries with it a high risk of complications and morbidity.  Serious etiology was considered. Ddx includes but is not limited to: Medication effect, poor p.o., SIADH, volume loss, pancreatitis, thyroid abnormality, echogenic, endocrine disturbance, etc.  Complete initial physical exam performed, notably the patient  was no acute distress, HDS.    Reviewed and confirmed nursing documentation for past medical history, family history, social history.  Vital signs reviewed.    Clinical Course as of 06/09/23 1604  Wed Jun 09, 2023  0920 Creatinine(!): 1.69 Similar to prior  [SG]  0920 Sodium(!!): 119 Was 117 yesterday, will give NS; send urine lytes and osm/serum osm [SG]  1224 CTAP: "IMPRESSION: 1. 5.3 x 4.9 x 4.3  cm rim enhancing collection immediately adjacent to or potentially involving the parenchyma of the posterior and superior aspect of the right prostate gland. This obscures the right seminal vesicle and tracks posterolaterally in the right pelvic floor. This lesion is contiguous with the anterior wall of the low rectum. Imaging features could be compatible with an abscess, potentially arising from the posterior prostate gland or right seminal vesicle. Rectal origin not excluded. Necrotic neoplasm in the central pelvis/pelvic floor would  also be a consideration."  Will d/w urology [SG]  1255 Urology repaged  [SG]  1306 Spoke w/ Dr Ronne Binning, will come see in consult.  Might need to have drain placed by IR but okay to admit here. [SG]    Clinical Course User Index [SG] Tanda Rockers A, DO     Sodium low, will check urine lites and osmolality.  Check thyroid and lipase.  Get CT imaging of the abdomen given early satiety, some vague abdominal pain.  Patient will require admission given hyponatremia to be secondary to thiazide diuretic that was discontinued yestd per chart review   CT concerning for possible neoplasm versus abscess, discussed with urology Dr. Ronne Binning, he will see in consult.  He will discuss with colleagues in regards to treatment plan.  Patient may require IR drain.  Okay to admit here.  Will admit under hospitalist service   Discussed again with Dr. Ronne Binning, favor this is likely neoplasm, all discussed with patient but at this time is recommending outpatient biopsy.  Will see in consult   Discussed findings with patient, he is agreeable to plan              Additional history obtained: -Additional history obtained from na -External records from outside source obtained and reviewed including: Chart review including previous notes, labs, imaging, consultation notes including  Recent primary care documentation, home medications, prior labs   Lab Tests: -I ordered, reviewed, and interpreted labs.   The pertinent results include:   Labs Reviewed  CBC WITH DIFFERENTIAL/PLATELET - Abnormal; Notable for the following components:      Result Value   RBC 3.83 (*)    Hemoglobin 12.1 (*)    HCT 35.8 (*)    Lymphs Abs 0.5 (*)    All other components within normal limits  COMPREHENSIVE METABOLIC PANEL - Abnormal; Notable for the following components:   Sodium 119 (*)    Chloride 85 (*)    CO2 20 (*)    Glucose, Bld 187 (*)    Creatinine, Ser 1.69 (*)    GFR, Estimated 43 (*)    All other  components within normal limits  URINALYSIS, ROUTINE W REFLEX MICROSCOPIC - Abnormal; Notable for the following components:   Color, Urine STRAW (*)    All other components within normal limits  OSMOLALITY - Abnormal; Notable for the following components:   Osmolality 266 (*)    All other components within normal limits  SODIUM - Abnormal; Notable for the following components:   Sodium 123 (*)    All other components within normal limits  MRSA NEXT GEN BY PCR, NASAL  NA AND K (SODIUM & POTASSIUM), RAND UR  LIPASE, BLOOD  TSH  T4, FREE  OSMOLALITY, URINE  PSA  SEDIMENTATION RATE  C-REACTIVE PROTEIN  HEMOGLOBIN A1C  SODIUM  SODIUM  SODIUM  SODIUM    Notable for hyponatremia  EKG   EKG Interpretation Date/Time:  Wednesday June 09 2023 09:03:59 EDT Ventricular Rate:  79 PR Interval:  149 QRS  Duration:  87 QT Interval:  360 QTC Calculation: 413 R Axis:   22  Text Interpretation: Sinus rhythm Atrial premature complexes Consider right atrial enlargement similar prior Confirmed by Tanda Rockers ([id number]) on 06/09/2023 9:23:13 AM         Imaging Studies ordered: I ordered imaging studies including CT abdomen I independently visualized the following imaging with scope of interpretation limited to determining acute life threatening conditions related to emergency care; findings noted above, significant for state abnormality as noted above I independently visualized and interpreted imaging. I agree with the radiologist interpretation   Medicines ordered and prescription drug management: Meds ordered this encounter  Medications   sodium chloride 0.9 % bolus 500 mL   iohexol (OMNIPAQUE) 300 MG/ML solution 80 mL   insulin aspart (novoLOG) injection 0-9 Units    Order Specific Question:   Correction coverage:    Answer:   Sensitive (thin, NPO, renal)    Order Specific Question:   CBG < 70:    Answer:   Implement Hypoglycemia Standing Orders and refer to Hypoglycemia  Standing Orders sidebar report    Order Specific Question:   CBG 70 - 120:    Answer:   0 units    Order Specific Question:   CBG 121 - 150:    Answer:   1 unit    Order Specific Question:   CBG 151 - 200:    Answer:   2 units    Order Specific Question:   CBG 201 - 250:    Answer:   3 units    Order Specific Question:   CBG 251 - 300:    Answer:   5 units    Order Specific Question:   CBG 301 - 350:    Answer:   7 units    Order Specific Question:   CBG 351 - 400    Answer:   9 units    Order Specific Question:   CBG > 400    Answer:   call MD and obtain STAT lab verification   insulin aspart (novoLOG) injection 3 Units   heparin injection 5,000 Units   0.9 %  sodium chloride infusion   OR Linked Order Group    acetaminophen (TYLENOL) tablet 650 mg    acetaminophen (TYLENOL) suppository 650 mg   oxyCODONE (Oxy IR/ROXICODONE) immediate release tablet 5 mg   fentaNYL (SUBLIMAZE) injection 25 mcg   traZODone (DESYREL) tablet 25 mg   bisacodyl (DULCOLAX) EC tablet 5 mg   OR Linked Order Group    ondansetron (ZOFRAN) tablet 4 mg    ondansetron (ZOFRAN) injection 4 mg   ipratropium-albuterol (DUONEB) 0.5-2.5 (3) MG/3ML nebulizer solution 3 mL   nicotine (NICODERM CQ - dosed in mg/24 hours) patch 21 mg   Chlorhexidine Gluconate Cloth 2 % PADS 6 each   alfuzosin (UROXATRAL) 24 hr tablet 10 mg   amLODipine (NORVASC) tablet 5 mg   aspirin EC tablet 81 mg   atorvastatin (LIPITOR) tablet 10 mg   insulin glargine-yfgn (SEMGLEE) injection 10 Units   traZODone (DESYREL) tablet 50 mg    -I have reviewed the patients home medicines and have made adjustments as needed   Consultations Obtained: I requested consultation with the urology,  and discussed lab and imaging findings as well as pertinent plan - they recommend: Admit   Cardiac Monitoring: Continuous pulse oximetry interpreted by myself, 99% on RA.    Social Determinants of Health:  Diagnosis or treatment significantly  limited by  social determinants of health: current smoker   Reevaluation: After the interventions noted above, I reevaluated the patient and found that they have stayed the same  Co morbidities that complicate the patient evaluation  Past Medical History:  Diagnosis Date   Diabetes mellitus    Fatty liver    GERD (gastroesophageal reflux disease)    HTN (hypertension)    Hyperlipidemia    Insomnia    Renal insufficiency       Dispostion: Disposition decision including need for hospitalization was considered, and patient admitted to the hospital.    Final Clinical Impression(s) / ED Diagnoses Final diagnoses:  Hyponatremia  Prostate mass        Sloan Leiter, DO 06/09/23 1604

## 2023-06-09 NOTE — Hospital Course (Addendum)
72 year old male longtime active smoker with peripheral vascular disease status post bilateral external iliac stents, diabetes mellitus, GERD, hypertension, hyperlipidemia, chronic kidney disease stage IIIb, fatty liver, GERD, diverticulosis was sent by his primary care provider with concern for abnormal labs.  He was taken off of hydrochlorothiazide yesterday after learning his sodium was down to 117.  Patient says that he has been taking hydrochlorothiazide for many years but has not been having his labs checked regularly.  Patient reports that he has been feeling unwell for the past several months.  He has lost about 20 pounds over the last 2 months.  He has very poor appetite.  He has fatigue and malaise.  He has been having testicular pain and has been having nocturia.  He reports a weak urinary stream.  He saw urology earlier this month.  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  In ED patient noted to have sodium of 119.  CT abdomen demonstrated a rim-enhancing lesion involving the right prostate gland that was very suspicious concerning for malignancy versus abscess.  Urology was consulted.  Patient is being admitted for further management of severe hyponatremia and new findings of prostatic abnormality concerning for malignancy.

## 2023-06-09 NOTE — ED Triage Notes (Signed)
Pt was sent to ED by Dr. Allena Katz for low sodium level. Pt c/o generalized weakness and excessive hunger x 1 month.

## 2023-06-10 DIAGNOSIS — R634 Abnormal weight loss: Secondary | ICD-10-CM | POA: Diagnosis not present

## 2023-06-10 DIAGNOSIS — E871 Hypo-osmolality and hyponatremia: Secondary | ICD-10-CM | POA: Diagnosis not present

## 2023-06-10 DIAGNOSIS — F17200 Nicotine dependence, unspecified, uncomplicated: Secondary | ICD-10-CM | POA: Diagnosis not present

## 2023-06-10 DIAGNOSIS — N4889 Other specified disorders of penis: Secondary | ICD-10-CM

## 2023-06-10 DIAGNOSIS — E1169 Type 2 diabetes mellitus with other specified complication: Secondary | ICD-10-CM

## 2023-06-10 DIAGNOSIS — E782 Mixed hyperlipidemia: Secondary | ICD-10-CM

## 2023-06-10 LAB — SODIUM
Sodium: 121 mmol/L — ABNORMAL LOW (ref 135–145)
Sodium: 120 mmol/L — ABNORMAL LOW (ref 135–145)
Sodium: 118 mmol/L — CL (ref 135–145)
Sodium: 120 mmol/L — ABNORMAL LOW (ref 135–145)

## 2023-06-10 LAB — TSH+FREE T4
Free T4: 1.32 ng/dL (ref 0.82–1.77)
TSH: 0.683 u[IU]/mL (ref 0.450–4.500)

## 2023-06-10 LAB — CBC
HCT: 31.9 % — ABNORMAL LOW (ref 39.0–52.0)
Hemoglobin: 11.1 g/dL — ABNORMAL LOW (ref 13.0–17.0)
MCH: 32.1 pg (ref 26.0–34.0)
MCHC: 34.8 g/dL (ref 30.0–36.0)
MCV: 92.2 fL (ref 80.0–100.0)
Platelets: 179 10*3/uL (ref 150–400)
RBC: 3.46 MIL/uL — ABNORMAL LOW (ref 4.22–5.81)
RDW: 13.5 % (ref 11.5–15.5)
WBC: 4 10*3/uL (ref 4.0–10.5)
nRBC: 0 % (ref 0.0–0.2)

## 2023-06-10 LAB — CBC WITH DIFFERENTIAL/PLATELET
Basophils Absolute: 0.1 10*3/uL (ref 0.0–0.2)
Basos: 1 %
EOS (ABSOLUTE): 0 10*3/uL (ref 0.0–0.4)
Eos: 0 %
Hematocrit: 36.3 % — ABNORMAL LOW (ref 37.5–51.0)
Hemoglobin: 11.8 g/dL — ABNORMAL LOW (ref 13.0–17.7)
Immature Grans (Abs): 0 10*3/uL (ref 0.0–0.1)
Immature Granulocytes: 1 %
Lymphocytes Absolute: 0.8 10*3/uL (ref 0.7–3.1)
Lymphs: 13 %
MCH: 32.2 pg (ref 26.6–33.0)
MCHC: 32.5 g/dL (ref 31.5–35.7)
MCV: 99 fL — ABNORMAL HIGH (ref 79–97)
Monocytes Absolute: 1.1 10*3/uL — ABNORMAL HIGH (ref 0.1–0.9)
Monocytes: 17 %
Neutrophils Absolute: 4.4 10*3/uL (ref 1.4–7.0)
Neutrophils: 68 %
Platelets: 241 10*3/uL (ref 150–450)
RBC: 3.67 x10E6/uL — ABNORMAL LOW (ref 4.14–5.80)
RDW: 13.4 % (ref 11.6–15.4)
WBC: 6.4 10*3/uL (ref 3.4–10.8)

## 2023-06-10 LAB — BASIC METABOLIC PANEL
Anion gap: 8 (ref 5–15)
BUN: 13 mg/dL (ref 8–23)
CO2: 20 mmol/L — ABNORMAL LOW (ref 22–32)
Calcium: 8.2 mg/dL — ABNORMAL LOW (ref 8.9–10.3)
Chloride: 92 mmol/L — ABNORMAL LOW (ref 98–111)
Creatinine, Ser: 1.37 mg/dL — ABNORMAL HIGH (ref 0.61–1.24)
GFR, Estimated: 55 mL/min — ABNORMAL LOW (ref >60)
Glucose, Bld: 120 mg/dL — ABNORMAL HIGH (ref 70–99)
Potassium: 4.4 mmol/L (ref 3.5–5.1)
Sodium: 120 mmol/L — ABNORMAL LOW (ref 135–145)

## 2023-06-10 LAB — GLUCOSE, CAPILLARY
Glucose-Capillary: 114 mg/dL — ABNORMAL HIGH (ref 70–99)
Glucose-Capillary: 103 mg/dL — ABNORMAL HIGH (ref 70–99)
Glucose-Capillary: 121 mg/dL — ABNORMAL HIGH (ref 70–99)
Glucose-Capillary: 153 mg/dL — ABNORMAL HIGH (ref 70–99)
Glucose-Capillary: 111 mg/dL — ABNORMAL HIGH (ref 70–99)

## 2023-06-10 LAB — CMP14+EGFR
ALT: 15 IU/L (ref 0–44)
AST: 16 IU/L (ref 0–40)
Albumin: 4.3 g/dL (ref 3.8–4.8)
Alkaline Phosphatase: 115 IU/L (ref 44–121)
BUN/Creatinine Ratio: 10 (ref 10–24)
BUN: 16 mg/dL (ref 8–27)
Bilirubin Total: 0.3 mg/dL (ref 0.0–1.2)
CO2: 20 mmol/L (ref 20–29)
Calcium: 9.1 mg/dL (ref 8.6–10.2)
Chloride: 84 mmol/L — ABNORMAL LOW (ref 96–106)
Creatinine, Ser: 1.58 mg/dL — ABNORMAL HIGH (ref 0.76–1.27)
Globulin, Total: 3.1 g/dL (ref 1.5–4.5)
Glucose: 202 mg/dL — ABNORMAL HIGH (ref 70–99)
Potassium: 5.7 mmol/L — ABNORMAL HIGH (ref 3.5–5.2)
Sodium: 117 mmol/L — CL (ref 134–144)
Total Protein: 7.4 g/dL (ref 6.0–8.5)
eGFR: 46 mL/min/{1.73_m2} — ABNORMAL LOW (ref >59)

## 2023-06-10 LAB — RENAL FUNCTION PANEL
Albumin: 3.6 g/dL (ref 3.5–5.0)
Anion gap: 9 (ref 5–15)
BUN: 13 mg/dL (ref 8–23)
CO2: 22 mmol/L (ref 22–32)
Calcium: 8.5 mg/dL — ABNORMAL LOW (ref 8.9–10.3)
Chloride: 90 mmol/L — ABNORMAL LOW (ref 98–111)
Creatinine, Ser: 1.39 mg/dL — ABNORMAL HIGH (ref 0.61–1.24)
GFR, Estimated: 54 mL/min — ABNORMAL LOW (ref >60)
Glucose, Bld: 174 mg/dL — ABNORMAL HIGH (ref 70–99)
Phosphorus: 2.6 mg/dL (ref 2.5–4.6)
Potassium: 4.4 mmol/L (ref 3.5–5.1)
Sodium: 121 mmol/L — ABNORMAL LOW (ref 135–145)

## 2023-06-10 LAB — VITAMIN B12: Vitamin B-12: 567 pg/mL (ref 232–1245)

## 2023-06-10 MED ORDER — SODIUM CHLORIDE 1 G PO TABS
1.0000 g | ORAL_TABLET | Freq: Two times a day (BID) | ORAL | Status: DC
  Administered 2023-06-10 – 2023-06-12 (×6): 1 g via ORAL
  Filled 2023-06-10 (×6): qty 1

## 2023-06-10 MED ORDER — FUROSEMIDE 10 MG/ML IJ SOLN
40.0000 mg | Freq: Once | INTRAMUSCULAR | Status: AC
  Administered 2023-06-10: 40 mg via INTRAVENOUS
  Filled 2023-06-10: qty 4

## 2023-06-10 MED ORDER — FERROUS SULFATE 325 (65 FE) MG PO TABS
325.0000 mg | ORAL_TABLET | Freq: Every day | ORAL | Status: DC
  Administered 2023-06-11 – 2023-06-14 (×4): 325 mg via ORAL
  Filled 2023-06-10 (×4): qty 1

## 2023-06-10 MED ORDER — CLOPIDOGREL BISULFATE 75 MG PO TABS
75.0000 mg | ORAL_TABLET | Freq: Every day | ORAL | Status: DC
  Administered 2023-06-10 – 2023-06-14 (×5): 75 mg via ORAL
  Filled 2023-06-10 (×5): qty 1

## 2023-06-10 NOTE — Consult Note (Signed)
[facility]  HISTORY AND PHYSICAL  Chase Briggs is an 72 y.o. male.    Chief Complaint: hyponatremia  HPI: Pt is a 53M with a PMH sig for HTN, HLD, CAD, DM II, and possible prostate cancer who is now seen in consultation at the request of Dr, Laural Benes for evaluation and management of hyponatremia.    Pt was seen by PCP yesterday where Na was 117, down from normonatremia in June.  Was advised to come to ED.    Na was 119 there.  Was given 500 mL NS bolus and started on isotonic IVFs.  Hydrochlorothiazide stopped.  Noted this was a new med since January but had had normal Na values since starting.    Initially Na rose to 123 after fluid bolus but has drifted back down to 120 on subsequent rechecks.  Initial osms: serum osms 266, urine osms 248, Urine Na 66.  BP is not low.    He noted some LUTS and CT showed a rim-enhancing nodule in prostate concerning for malignancy.    In this setting we are asked to see.    Pt reports restricting salt intake some.  Appetite isnt as good as it once was- wife reports that he's lost 7-8 lbs in 4 months.    NO headache, blurry vision, weakness/ numbness, seizxure activity.    PMH: Past Medical History:  Diagnosis Date   Diabetes mellitus    Fatty liver    GERD (gastroesophageal reflux disease)    HTN (hypertension)    Hyperlipidemia    Insomnia    Renal insufficiency    PSH: Past Surgical History:  Procedure Laterality Date   ABDOMINAL AORTOGRAM W/LOWER EXTREMITY N/A 08/22/2018   Procedure: ABDOMINAL AORTOGRAM W/LOWER EXTREMITY;  Surgeon: [name], MD;  Location: [facility] INVASIVE CV LAB;  Service: Cardiovascular;  Laterality: N/A;   BACK SURGERY     CARPAL TUNNEL WITH CUBITAL TUNNEL Right 03/21/2019   Procedure: RIGHT CARPAL TUNNEL AND CUBITAL  TUNNEL RELEASES;  Surgeon: [name], MD;  Location: [facility];  Service: Orthopedics;  Laterality: Right;  AXILLARY BLOCK   COLONOSCOPY  01/2007   Dr.  [name], reviewed report, mild diverticulosis. Prep adequate. Next TCS due 01/2017   ESOPHAGOGASTRODUODENOSCOPY N/A 10/21/2012   GEX:[id number] GASTRITIS   MASS EXCISION Right 10/26/2012   Procedure: EXCISION MASS;  Surgeon: [name], MD;  Location: [facility];  Service: General;  Laterality: Right;   PERIPHERAL VASCULAR INTERVENTION  08/22/2018   Procedure: PERIPHERAL VASCULAR INTERVENTION;  Surgeon: [name], MD;  Location: [facility] INVASIVE CV LAB;  Service: Cardiovascular;;  bilateral external iliac stents   scalp mass  4-5 yrs ago   Dr Angelique Holm NERVE TRANSPOSITION Right 03/21/2019   Procedure: DECOMPRESSION RIGHT ULNAR NERVE;  Surgeon: [name], MD;  Location: [facility];  Service: Orthopedics;  Laterality: Right;    Past Medical History:  Diagnosis Date   Diabetes mellitus    Fatty liver    GERD (gastroesophageal reflux disease)    HTN (hypertension)    Hyperlipidemia    Insomnia    Renal insufficiency     Medications:  Scheduled:  alfuzosin  10 mg Oral QHS   amLODipine  5 mg Oral Daily   aspirin EC  81 mg Oral Q breakfast   atorvastatin  10 mg Oral QPM   Chlorhexidine Gluconate Cloth  6 each Topical Q0600   heparin  5,000 Units Subcutaneous Q8H   insulin  aspart  0-9 Units Subcutaneous TID WC   insulin aspart  3 Units Subcutaneous TID WC   insulin glargine-yfgn  10 Units Subcutaneous Daily   nicotine  21 mg Transdermal Daily   sodium chloride  1 g Oral BID WC   traZODone  50 mg Oral QHS    Medications Prior to Admission  Medication Sig Dispense Refill   albuterol (VENTOLIN HFA) 108 (90 Base) MCG/ACT inhaler Inhale 2 puffs into the lungs every 6 (six) hours as needed for wheezing or shortness of breath. 20.1 g 0   amLODipine (NORVASC) 10 MG tablet Take 1 tablet (10 mg total) by mouth daily. 90 tablet 3   aspirin EC 81 MG tablet Take 1 tablet (81 mg total) by mouth daily with breakfast. 30 tablet 11   atorvastatin (LIPITOR) 10 MG tablet  Take 1 tablet (10 mg total) by mouth daily. 90 tablet 3   cetirizine (ZYRTEC) 10 MG tablet Take 1 tablet (10 mg total) by mouth daily. 30 tablet 0   Cholecalciferol (VITAMIN D) 125 MCG (5000 UT) CAPS Take 2,000 Units by mouth daily. 30 capsule 3   cloNIDine (CATAPRES) 0.1 MG tablet Take 1 tablet (0.1 mg total) by mouth 2 (two) times daily. 180 tablet 3   clopidogrel (PLAVIX) 75 MG tablet TAKE (1) TABLET BY MOUTH ONCE DAILY. 90 tablet 3   EPINEPHrine 0.3 mg/0.3 mL IJ SOAJ injection Inject 0.3 mg into the muscle as needed for anaphylaxis. 1 each 1   ferrous sulfate 325 (65 FE) MG EC tablet Take 325 mg by mouth daily with breakfast.     LANTUS SOLOSTAR 100 UNIT/ML Solostar Pen INJECT 14 UNITS UNDER THE SKIN AT BEDTIME (Patient taking differently: Inject 14 Units into the skin daily.) 15 mL 3   lisinopril (ZESTRIL) 40 MG tablet Take 40 mg by mouth daily.     mupirocin ointment (BACTROBAN) 2 % Apply 1 Application topically 2 (two) times daily. Over upper lip area. 22 g 0   nystatin (MYCOSTATIN) 100000 UNIT/ML suspension Take 5 mLs (500,000 Units total) by mouth 4 (four) times daily. 60 mL 0   RESTASIS 0.05 % ophthalmic emulsion Place 1 drop into both eyes 2 (two) times daily as needed.     sulfamethoxazole-trimethoprim (BACTRIM DS) 800-160 MG tablet Take 1 tablet by mouth every 12 (twelve) hours. 56 tablet 0   traMADol (ULTRAM) 50 MG tablet Take 50 mg by mouth at bedtime.     traZODone (DESYREL) 100 MG tablet Take 1 tablet (100 mg total) by mouth daily with breakfast. (Patient taking differently: Take 100 mg by mouth at bedtime.) 90 tablet 1   glucose blood test strip 1 each by Other route 2 (two) times daily. Use as instructed bid E11.65 Relion Premier 100 each 5   Lancets MISC 1 each by Does not apply route 2 (two) times daily. E11.65 Relion Premier 100 each 5   SURE COMFORT PEN NEEDLES 32G X 4 MM MISC USE ONCE DAILY 100 each 3    ALLERGIES:  No Known Allergies  FAM HX: Family History  Problem  Relation Age of Onset   Diabetes Mother    Diabetes Father    Diabetes Brother    Colon cancer Neg Hx    Liver disease Neg Hx     Social History:   reports that he has been smoking cigarettes. He has a 17.5 pack-year smoking history. He has never used smokeless tobacco. He reports that he does not drink alcohol and does not  use drugs.  ROS: ROS: all other systems reviewed and are negative except as per HPI  Blood pressure (!) 164/54, pulse 84, temperature 98.4 F (36.9 C), temperature source Oral, resp. rate 20, height 5\' 7"  (1.702 m), weight 56.2 kg, SpO2 98%. PHYSICAL EXAM: Physical Exam GEN NAD, lying flat in bed HEENT EOMI PERRL NECK no overt JVD PULM clear CV RRR ABD soft EXT no LE edema NEUROAAO x 3, noasterixis SKIN  good skin turgor   Results for orders placed or performed during the hospital encounter of 06/09/23 (from the past 48 hour(s))  CBC with Differential     Status: Abnormal   Collection Time: 06/09/23  8:54 AM  Result Value Ref Range   WBC 4.6 4.0 - 10.5 K/uL   RBC 3.83 (L) 4.22 - 5.81 MIL/uL   Hemoglobin 12.1 (L) 13.0 - 17.0 g/dL   HCT 21.3 (L) 08.6 - 57.8 %   MCV 93.5 80.0 - 100.0 fL   MCH 31.6 26.0 - 34.0 pg   MCHC 33.8 30.0 - 36.0 g/dL   RDW 46.9 62.9 - 52.8 %   Platelets 207 150 - 400 K/uL   nRBC 0.0 0.0 - 0.2 %   Neutrophils Relative % 73 %   Neutro Abs 3.3 1.7 - 7.7 K/uL   Lymphocytes Relative 12 %   Lymphs Abs 0.5 (L) 0.7 - 4.0 K/uL   Monocytes Relative 13 %   Monocytes Absolute 0.6 0.1 - 1.0 K/uL   Eosinophils Relative 0 %   Eosinophils Absolute 0.0 0.0 - 0.5 K/uL   Basophils Relative 2 %   Basophils Absolute 0.1 0.0 - 0.1 K/uL   Immature Granulocytes 0 %   Abs Immature Granulocytes 0.02 0.00 - 0.07 K/uL    Comment: Performed at [facility], [address], [place], [place] [address]  Comprehensive metabolic panel     Status: Abnormal   Collection Time: 06/09/23  8:54 AM  Result Value Ref Range   Sodium 119 (LL) 135 - 145 mmol/L     Comment: CRITICAL RESULT CALLED TO, READ BACK BY AND VERIFIED WITH WHITE,A  AT 9:17 ON 06/09/23 BY PURDIE,J   Potassium 4.6 3.5 - 5.1 mmol/L   Chloride 85 (L) 98 - 111 mmol/L   CO2 20 (L) 22 - 32 mmol/L   Glucose, Bld 187 (H) 70 - 99 mg/dL    Comment: Glucose reference range applies only to samples taken after fasting for at least 8 hours.   BUN 17 8 - 23 mg/dL   Creatinine, Ser 4.01 (H) 0.61 - 1.24 mg/dL   Calcium 9.0 8.9 - 02.7 mg/dL   Total Protein 7.9 6.5 - 8.1 g/dL   Albumin 4.3 3.5 - 5.0 g/dL   AST 19 15 - 41 U/L   ALT 20 0 - 44 U/L   Alkaline Phosphatase 98 38 - 126 U/L   Total Bilirubin 0.6 0.3 - 1.2 mg/dL   GFR, Estimated 43 (L) >60 mL/min    Comment: (NOTE) Calculated using the CKD-EPI Creatinine Equation (2021)    Anion gap 14 5 - 15    Comment: Performed at [facility], [address], [place], [place] [address]  Osmolality     Status: Abnormal   Collection Time: 06/09/23  9:32 AM  Result Value Ref Range   Osmolality 266 (L) 275 - 295 mOsm/kg    Comment: Performed at [facility], [address], [place], [place] [address]  Lipase, blood     Status: None  Collection Time: 06/09/23  9:32 AM  Result Value Ref Range   Lipase 26 11 - 51 U/L    Comment: Performed at [facility], [address], [place], [place] [address]  TSH     Status: None   Collection Time: 06/09/23  9:32 AM  Result Value Ref Range   TSH 0.555 0.350 - 4.500 uIU/mL    Comment: Performed by a 3rd Generation assay with a functional sensitivity of <=0.01 uIU/mL. Performed at [facility], [address] [place], [place] [address]   T4, free     Status: None   Collection Time: 06/09/23  9:32 AM  Result Value Ref Range   Free T4 0.99 0.61 - 1.12 ng/dL    Comment: (NOTE) Biotin ingestion may interfere with free T4 tests. If the results are inconsistent with the TSH level, previous test results, or the clinical presentation, then consider biotin interference. If needed, order repeat testing  after stopping biotin. Performed at [facility], [address], [place], [place] [address]   Urinalysis, Routine w reflex microscopic -Urine, Clean Catch     Status: Abnormal   Collection Time: 06/09/23 10:30 AM  Result Value Ref Range   Color, Urine STRAW (A) YELLOW   APPearance CLEAR CLEAR   Specific Gravity, Urine 1.014 1.005 - 1.030   pH 7.0 5.0 - 8.0   Glucose, UA NEGATIVE NEGATIVE mg/dL   Hgb urine dipstick NEGATIVE NEGATIVE   Bilirubin Urine NEGATIVE NEGATIVE   Ketones, ur NEGATIVE NEGATIVE mg/dL   Protein, ur NEGATIVE NEGATIVE mg/dL   Nitrite NEGATIVE NEGATIVE   Leukocytes,Ua NEGATIVE NEGATIVE    Comment: Performed at [facility], [address], [place], [place] [address]  Na and K (sodium & potassium), rand urine     Status: None   Collection Time: 06/09/23 10:30 AM  Result Value Ref Range   Sodium, Ur 66 mmol/L   Potassium Urine 10 mmol/L    Comment: Performed at [facility], [address], [place], [place] [address]  Osmolality, urine     Status: Abnormal   Collection Time: 06/09/23 10:30 AM  Result Value Ref Range   Osmolality, Ur 248 (L) 300 - 900 mOsm/kg    Comment: Performed at [facility], [address] [place], [place] [address]  MRSA Next Gen by PCR, Nasal     Status: None   Collection Time: 06/09/23  2:17 PM   Specimen: Nasal Mucosa; Nasal Swab  Result Value Ref Range   MRSA by PCR Next Gen NOT DETECTED NOT DETECTED    Comment: (NOTE) The GeneXpert MRSA Assay (FDA approved for NASAL specimens only), is one component of a comprehensive MRSA colonization surveillance program. It is not intended to diagnose MRSA infection nor to guide or monitor treatment for MRSA infections. Test performance is not FDA approved in patients less than 61 years old. Performed at [facility], [address], [place], [place] [address]   PSA     Status: None   Collection Time: 06/09/23  3:35 PM  Result Value Ref Range   Prostatic Specific Antigen 2.28 0.00  - 4.00 ng/mL    Comment: (NOTE) While PSA levels of <=4.00 ng/ml are reported as reference range, some men with levels below 4.00 ng/ml can have prostate cancer and many men with PSA above 4.00 ng/ml do not have prostate cancer.  Other tests such as free PSA, age specific reference ranges, PSA velocity and PSA doubling time may be helpful especially in  men less than 80 years old. Performed at [facility], [address] [place], [place] [address]   Sedimentation rate     Status: Abnormal   Collection Time: 06/09/23  3:35 PM  Result Value Ref Range   Sed Rate 30 (H) 0 - 16 mm/hr    Comment: Performed at [facility], [address] [place], [place] [address]  C-reactive protein     Status: Abnormal   Collection Time: 06/09/23  3:35 PM  Result Value Ref Range   CRP 1.9 (H) <1.0 mg/dL    Comment: Performed at [facility], [address], [place], [place] [address]  Hemoglobin A1c     Status: Abnormal   Collection Time: 06/09/23  3:35 PM  Result Value Ref Range   Hgb A1c MFr Bld 6.3 (H) 4.8 - 5.6 %    Comment: (NOTE) Pre diabetes:          5.7%-6.4%  Diabetes:              >6.4%  Glycemic control for   <7.0% adults with diabetes    Mean Plasma Glucose 134.11 mg/dL    Comment: Performed at [facility], [address], [place], [place] [address]  Sodium     Status: Abnormal   Collection Time: 06/09/23  3:35 PM  Result Value Ref Range   Sodium 123 (L) 135 - 145 mmol/L    Comment: Performed at [facility], [address] [place], [place] [address]  Glucose, capillary     Status: Abnormal   Collection Time: 06/09/23  4:31 PM  Result Value Ref Range   Glucose-Capillary 121 (H) 70 - 99 mg/dL    Comment: Glucose reference range applies only to samples taken after fasting for at least 8 hours.  Sodium     Status: Abnormal   Collection Time: 06/09/23  6:07 PM  Result Value Ref Range   Sodium 121 (L) 135 - 145 mmol/L    Comment: Performed at [facility], [address], [place], [place] [address]  Sodium     Status: Abnormal   Collection Time: 06/09/23 10:20 PM  Result Value Ref Range   Sodium 122 (L) 135 - 145 mmol/L    Comment: Performed at [facility], [address] [place], [place] [address]  Glucose, capillary     Status: None   Collection Time: 06/09/23 10:25 PM  Result Value Ref Range   Glucose-Capillary 70 70 - 99 mg/dL    Comment: Glucose reference range applies only to samples taken after fasting for at least 8 hours.  CBC     Status: Abnormal   Collection Time: 06/10/23  1:45 AM  Result Value Ref Range   WBC 4.0 4.0 - 10.5 K/uL   RBC 3.46 (L) 4.22 - 5.81 MIL/uL   Hemoglobin 11.1 (L) 13.0 - 17.0 g/dL   HCT 96.2 (L) 95.2 - 84.1 %   MCV 92.2 80.0 - 100.0 fL   MCH 32.1 26.0 - 34.0 pg   MCHC 34.8 30.0 - 36.0 g/dL   RDW 32.4 40.1 - 02.7 %   Platelets 179 150 - 400 K/uL   nRBC 0.0 0.0 - 0.2 %    Comment: Performed at [facility], [address], [place], [place] [address]  Basic metabolic panel     Status: Abnormal   Collection Time: 06/10/23  1:45 AM  Result Value Ref Range   Sodium 120 (L) 135 - 145 mmol/L  Potassium 4.4 3.5 - 5.1 mmol/L   Chloride 92 (L) 98 - 111 mmol/L   CO2 20 (L) 22 - 32 mmol/L   Glucose, Bld 120 (H) 70 - 99 mg/dL    Comment: Glucose reference range applies only to samples taken after fasting for at least 8 hours.   BUN 13 8 - 23 mg/dL   Creatinine, Ser 4.09 (H) 0.61 - 1.24 mg/dL   Calcium 8.2 (L) 8.9 - 10.3 mg/dL   GFR, Estimated 55 (L) >60 mL/min    Comment: (NOTE) Calculated using the CKD-EPI Creatinine Equation (2021)    Anion gap 8 5 - 15    Comment: Performed at [facility], [address] [place], [place] [address]  Sodium     Status: Abnormal   Collection Time: 06/10/23  1:45 AM  Result Value Ref Range   Sodium 121 (L) 135 - 145 mmol/L    Comment: Performed at [facility], [address] [place], [place] [address]  Glucose, capillary     Status: Abnormal   Collection Time:  06/10/23  2:51 AM  Result Value Ref Range   Glucose-Capillary 114 (H) 70 - 99 mg/dL    Comment: Glucose reference range applies only to samples taken after fasting for at least 8 hours.  Sodium     Status: Abnormal   Collection Time: 06/10/23  7:03 AM  Result Value Ref Range   Sodium 120 (L) 135 - 145 mmol/L    Comment: Performed at [facility], [address] [place], [place] [address]  Glucose, capillary     Status: Abnormal   Collection Time: 06/10/23  7:32 AM  Result Value Ref Range   Glucose-Capillary 103 (H) 70 - 99 mg/dL    Comment: Glucose reference range applies only to samples taken after fasting for at least 8 hours.    CT ABDOMEN PELVIS W CONTRAST  Result Date: 06/09/2023 CLINICAL DATA:  Nonlocalized abdominal pain and weight loss. EXAM: CT ABDOMEN AND PELVIS WITH CONTRAST TECHNIQUE: Multidetector CT imaging of the abdomen and pelvis was performed using the standard protocol following bolus administration of intravenous contrast. RADIATION DOSE REDUCTION: This exam was performed according to the departmental dose-optimization program which includes automated exposure control, adjustment of the mA and/or kV according to patient size and/or use of iterative reconstruction technique. CONTRAST:  80mL OMNIPAQUE IOHEXOL 300 MG/ML  SOLN COMPARISON:  01/28/2023 FINDINGS: Lower chest: No acute findings. Hepatobiliary: No suspicious focal abnormality within the liver parenchyma. Small area of low attenuation in the anterior liver, adjacent to the falciform ligament, is in a characteristic location for focal fatty deposition. Gallbladder is nondistended. No intrahepatic or extrahepatic biliary dilation. Pancreas: No focal mass lesion. No dilatation of the main duct. No intraparenchymal cyst. No peripancreatic edema. Spleen: No splenomegaly. No suspicious focal mass lesion. Adrenals/Urinary Tract: No adrenal nodule or mass. Tiny cyst noted lower pole left kidney. Right kidney unremarkable. No  evidence for hydroureter. The urinary bladder appears normal for the degree of distention. Stomach/Bowel: Stomach is unremarkable. No gastric wall thickening. No evidence of outlet obstruction. Duodenum is normally positioned as is the ligament of Treitz. No small bowel wall thickening. No small bowel dilatation. The terminal ileum is normal. The appendix is normal. No gross colonic mass. No colonic wall thickening. Vascular/Lymphatic: There is moderate atherosclerotic calcification of the abdominal aorta without aneurysm. Advanced atherosclerotic disease noted in the common and external iliac arteries bilaterally with evidence of bilateral external iliac artery stent placement. There is no gastrohepatic  or hepatoduodenal ligament lymphadenopathy. No retroperitoneal or mesenteric lymphadenopathy. No pelvic sidewall lymphadenopathy. Reproductive: Prostate gland is mildly enlarged and heterogeneous. There is a rim enhancing collection immediately adjacent to or potentially involving the parenchyma of the posterior and superior aspect of the right prostate gland. This obscures the right seminal vesicle and tracks posterolaterally in the right pelvic floor measuring on the order of 5.3 x 4.9 x 4.3 cm (image 62/2). This lesion is contiguous with the anterior wall of the low rectum. Other: No intraperitoneal free fluid. Musculoskeletal: No worrisome lytic or sclerotic osseous abnormality. IMPRESSION: 1. 5.3 x 4.9 x 4.3 cm rim enhancing collection immediately adjacent to or potentially involving the parenchyma of the posterior and superior aspect of the right prostate gland. This obscures the right seminal vesicle and tracks posterolaterally in the right pelvic floor. This lesion is contiguous with the anterior wall of the low rectum. Imaging features could be compatible with an abscess, potentially arising from the posterior prostate gland or right seminal vesicle. Rectal origin not excluded. Necrotic neoplasm in the  central pelvis/pelvic floor would also be a consideration. 2.  Aortic Atherosclerosis (ICD10-I70.0). Electronically Signed   By: Kennith Center M.D.   On: 06/09/2023 12:00    Assessment/Plan  Hyponatremia:  Initial osms: serum osms 266, urine osms 248, Urine Na 66.  BP is not low.  - appears to be mixed picture between effect of hydrochlorothiazide, possible SIADH,   - initially improved with IVFs, now has drifted down to 120  - have stopped IVFs, added salt tabs and fluid restriction  - agree with q 4 hr Na  - may need to add lasix in the PM if not coming up  - no indication for 3% at this time but if we are still going the wrong way we may have to  2.  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  HTN:  - off hydrochlorothiazide  - on amlodipine  4.  Dispo: pending  Bufford Buttner 06/10/2023, 10:24 AM

## 2023-06-10 NOTE — Consult Note (Signed)
Urology Consult  Referring physician: Dr. Laural Benes Reason for referral: prostate mass  Chief Complaint: difficulty urinating  History of Present Illness: Chase Briggs is a 71yo with a history of Diabetes and BPH who was admitted with hyponatremia. 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 He was seen in my office 3 weeks ago for evaluation of epididymo-orchitis and difficulty urinating. He was started on bactrim DS for 4 weeks and uroxatral 10mg  daily. His urinary symptoms had significantly improved since starting the uroxatral. He has had a 20lb weight loss over the past 2 months. He had a PSA in 2021 which was 1.4.   Past Medical History:  Diagnosis Date   Diabetes mellitus    Fatty liver    GERD (gastroesophageal reflux disease)    HTN (hypertension)    Hyperlipidemia    Insomnia    Renal insufficiency    Past Surgical History:  Procedure Laterality Date   ABDOMINAL AORTOGRAM W/LOWER EXTREMITY N/A 08/22/2018   Procedure: ABDOMINAL AORTOGRAM W/LOWER EXTREMITY;  Surgeon: [name], MD;  Location: [facility] INVASIVE CV LAB;  Service: Cardiovascular;  Laterality: N/A;   BACK SURGERY     CARPAL TUNNEL WITH CUBITAL TUNNEL Right 03/21/2019   Procedure: RIGHT CARPAL TUNNEL AND CUBITAL  TUNNEL RELEASES;  Surgeon: [name], MD;  Location: [facility];  Service: Orthopedics;  Laterality: Right;  AXILLARY BLOCK   COLONOSCOPY  01/2007   Dr. [name], reviewed report, mild diverticulosis. Prep adequate. Next TCS due 01/2017   ESOPHAGOGASTRODUODENOSCOPY N/A 10/21/2012   WGN:FAOZHYQM GASTRITIS   MASS EXCISION Right 10/26/2012   Procedure: EXCISION MASS;  Surgeon: [name], MD;  Location: [facility];  Service: General;  Laterality: Right;   PERIPHERAL VASCULAR INTERVENTION  08/22/2018   Procedure: PERIPHERAL VASCULAR INTERVENTION;  Surgeon: [name], MD;  Location: [facility] INVASIVE CV LAB;  Service: Cardiovascular;;  bilateral external iliac stents   scalp mass  4-5 yrs ago   Dr Angelique Holm NERVE TRANSPOSITION Right 03/21/2019   Procedure: DECOMPRESSION RIGHT ULNAR NERVE;  Surgeon: [name], MD;  Location: [facility];  Service: Orthopedics;  Laterality: Right;    Medications: I have reviewed the patient's current medications. Allergies: No Known Allergies  Family History  Problem Relation Age of Onset   Diabetes Mother    Diabetes Father    Diabetes Brother    Colon cancer Neg Hx    Liver disease Neg Hx    Social History:  reports that he has been smoking cigarettes. He has a 17.5 pack-year smoking history. He has never used smokeless tobacco. He reports that he does not drink alcohol and does not use drugs.  Review of Systems  Genitourinary:  Positive for difficulty urinating.  All other systems reviewed and are negative.   Physical Exam:  Vital signs in last 24 hours: Temp:  [98.2 F (36.8 C)-99.2 F (37.3 C)] 98.5 F (36.9 C) (09/26 1139) Pulse Rate:  [64-116] 85 (09/26 1100) Resp:  [17-29] 20 (09/26 1100) BP: (123-205)/(42-136) 142/64 (09/26 1100) SpO2:  [96 %-100 %] 99 % (09/26 1100) Physical Exam Vitals reviewed.  Constitutional:      Appearance: Normal appearance.  HENT:     Head: Normocephalic and atraumatic.     Mouth/Throat:     Mouth: Mucous membranes are dry.  Eyes:     Extraocular Movements: Extraocular movements  intact.     Pupils: Pupils are equal, round, and reactive to light.  Cardiovascular:     Rate and Rhythm: Normal rate and regular rhythm.  Pulmonary:     Effort: Pulmonary effort is normal. No respiratory distress.  Abdominal:     General: Abdomen is flat. There is no distension.  Musculoskeletal:        General: No swelling. Normal range of motion.     Cervical back: Normal range of motion and neck supple.  Skin:    General: Skin is warm and dry.   Neurological:     General: No focal deficit present.     Mental Status: He is alert and oriented to person, place, and time.  Psychiatric:        Mood and Affect: Mood normal.        Behavior: Behavior normal.        Thought Content: Thought content normal.        Judgment: Judgment normal.     Laboratory Data:  Results for orders placed or performed during the hospital encounter of 06/09/23 (from the past 72 hour(s))  CBC with Differential     Status: Abnormal   Collection Time: 06/09/23  8:54 AM  Result Value Ref Range   WBC 4.6 4.0 - 10.5 K/uL   RBC 3.83 (L) 4.22 - 5.81 MIL/uL   Hemoglobin 12.1 (L) 13.0 - 17.0 g/dL   HCT 40.9 (L) 81.1 - 91.4 %   MCV 93.5 80.0 - 100.0 fL   MCH 31.6 26.0 - 34.0 pg   MCHC 33.8 30.0 - 36.0 g/dL   RDW 78.2 95.6 - 21.3 %   Platelets 207 150 - 400 K/uL   nRBC 0.0 0.0 - 0.2 %   Neutrophils Relative % 73 %   Neutro Abs 3.3 1.7 - 7.7 K/uL   Lymphocytes Relative 12 %   Lymphs Abs 0.5 (L) 0.7 - 4.0 K/uL   Monocytes Relative 13 %   Monocytes Absolute 0.6 0.1 - 1.0 K/uL   Eosinophils Relative 0 %   Eosinophils Absolute 0.0 0.0 - 0.5 K/uL   Basophils Relative 2 %   Basophils Absolute 0.1 0.0 - 0.1 K/uL   Immature Granulocytes 0 %   Abs Immature Granulocytes 0.02 0.00 - 0.07 K/uL    Comment: Performed at [facility], [address], [place], [place] [address]  Comprehensive metabolic panel     Status: Abnormal   Collection Time: 06/09/23  8:54 AM  Result Value Ref Range   Sodium 119 (LL) 135 - 145 mmol/L    Comment: CRITICAL RESULT CALLED TO, READ BACK BY AND VERIFIED WITH WHITE,A  AT 9:17 ON 06/09/23 BY PURDIE,J   Potassium 4.6 3.5 - 5.1 mmol/L   Chloride 85 (L) 98 - 111 mmol/L   CO2 20 (L) 22 - 32 mmol/L   Glucose, Bld 187 (H) 70 - 99 mg/dL    Comment: Glucose reference range applies only to samples taken after fasting for at least 8 hours.   BUN 17 8 - 23 mg/dL   Creatinine, Ser 8.46 (H) 0.61 - 1.24 mg/dL   Calcium 9.0 8.9 - 96.2 mg/dL    Total Protein 7.9 6.5 - 8.1 g/dL   Albumin 4.3 3.5 - 5.0 g/dL   AST 19 15 - 41 U/L   ALT 20 0 - 44 U/L   Alkaline Phosphatase 98 38 - 126 U/L   Total Bilirubin 0.6 0.3 - 1.2 mg/dL   GFR, Estimated 43 (  L) >60 mL/min    Comment: (NOTE) Calculated using the CKD-EPI Creatinine Equation (2021)    Anion gap 14 5 - 15    Comment: Performed at [facility], [address] [place], [place] [address]  Osmolality     Status: Abnormal   Collection Time: 06/09/23  9:32 AM  Result Value Ref Range   Osmolality 266 (L) 275 - 295 mOsm/kg    Comment: Performed at [facility], [address], [place], [place] [address]  Lipase, blood     Status: None   Collection Time: 06/09/23  9:32 AM  Result Value Ref Range   Lipase 26 11 - 51 U/L    Comment: Performed at [facility], [address], [place], [place] [address]  TSH     Status: None   Collection Time: 06/09/23  9:32 AM  Result Value Ref Range   TSH 0.555 0.350 - 4.500 uIU/mL    Comment: Performed by a 3rd Generation assay with a functional sensitivity of <=0.01 uIU/mL. Performed at [facility], [address] [place], [place] [address]   T4, free     Status: None   Collection Time: 06/09/23  9:32 AM  Result Value Ref Range   Free T4 0.99 0.61 - 1.12 ng/dL    Comment: (NOTE) Biotin ingestion may interfere with free T4 tests. If the results are inconsistent with the TSH level, previous test results, or the clinical presentation, then consider biotin interference. If needed, order repeat testing after stopping biotin. Performed at [facility], [address], [place], [place] [address]   Urinalysis, Routine w reflex microscopic -Urine, Clean Catch     Status: Abnormal   Collection Time: 06/09/23 10:30 AM  Result Value Ref Range   Color, Urine STRAW (A) YELLOW   APPearance CLEAR CLEAR   Specific Gravity, Urine 1.014 1.005 - 1.030   pH 7.0 5.0 - 8.0   Glucose, UA NEGATIVE NEGATIVE mg/dL   Hgb urine dipstick NEGATIVE NEGATIVE    Bilirubin Urine NEGATIVE NEGATIVE   Ketones, ur NEGATIVE NEGATIVE mg/dL   Protein, ur NEGATIVE NEGATIVE mg/dL   Nitrite NEGATIVE NEGATIVE   Leukocytes,Ua NEGATIVE NEGATIVE    Comment: Performed at [facility], [address], [place], [place] [address]  Na and K (sodium & potassium), rand urine     Status: None   Collection Time: 06/09/23 10:30 AM  Result Value Ref Range   Sodium, Ur 66 mmol/L   Potassium Urine 10 mmol/L    Comment: Performed at [facility], [address] [place], [place] [address]  Osmolality, urine     Status: Abnormal   Collection Time: 06/09/23 10:30 AM  Result Value Ref Range   Osmolality, Ur 248 (L) 300 - 900 mOsm/kg    Comment: Performed at [facility], [address] [place], [place] [address]  MRSA Next Gen by PCR, Nasal     Status: None   Collection Time: 06/09/23  2:17 PM   Specimen: Nasal Mucosa; Nasal Swab  Result Value Ref Range   MRSA by PCR Next Gen NOT DETECTED NOT DETECTED    Comment: (NOTE) The GeneXpert MRSA Assay (FDA approved for NASAL specimens only), is one component of a comprehensive MRSA colonization surveillance program. It is not intended to diagnose MRSA infection nor to guide or monitor treatment for MRSA infections. Test performance is not FDA approved in patients less than 31 years old. Performed at [facility], [address], [place],  [place] [address]   PSA     Status: None   Collection Time: 06/09/23  3:35 PM  Result Value Ref Range   Prostatic Specific Antigen 2.28 0.00 - 4.00 ng/mL    Comment: (NOTE) While PSA levels of <=4.00 ng/ml are reported as reference range, some men with levels below 4.00 ng/ml can have prostate cancer and many men with PSA above 4.00 ng/ml do not have prostate cancer.  Other tests such as free PSA, age specific reference ranges, PSA velocity and PSA doubling time may be helpful especially in men less than 66 years old. Performed at [facility], [address]  [place], [place] [address]   Sedimentation rate     Status: Abnormal   Collection Time: 06/09/23  3:35 PM  Result Value Ref Range   Sed Rate 30 (H) 0 - 16 mm/hr    Comment: Performed at [facility], [address], [place], [place] [address]  C-reactive protein     Status: Abnormal   Collection Time: 06/09/23  3:35 PM  Result Value Ref Range   CRP 1.9 (H) <1.0 mg/dL    Comment: Performed at [facility], [address], [place], [place] [address]  Hemoglobin A1c     Status: Abnormal   Collection Time: 06/09/23  3:35 PM  Result Value Ref Range   Hgb A1c MFr Bld 6.3 (H) 4.8 - 5.6 %    Comment: (NOTE) Pre diabetes:          5.7%-6.4%  Diabetes:              >6.4%  Glycemic control for   <7.0% adults with diabetes    Mean Plasma Glucose 134.11 mg/dL    Comment: Performed at [facility], [address], [place], [place] [address]  Sodium     Status: Abnormal   Collection Time: 06/09/23  3:35 PM  Result Value Ref Range   Sodium 123 (L) 135 - 145 mmol/L    Comment: Performed at [facility], [address], [place], [place] [address]  Glucose, capillary     Status: Abnormal   Collection Time: 06/09/23  4:31 PM  Result Value Ref Range   Glucose-Capillary 121 (H) 70 - 99 mg/dL    Comment: Glucose reference range applies only to samples taken after fasting for at least 8 hours.  Sodium     Status: Abnormal   Collection Time: 06/09/23  6:07 PM  Result Value Ref Range   Sodium 121 (L) 135 - 145 mmol/L    Comment: Performed at [facility], [address] [place], [place] [address]  Sodium     Status: Abnormal   Collection Time: 06/09/23 10:20 PM  Result Value Ref Range   Sodium 122 (L) 135 - 145 mmol/L    Comment: Performed at [facility], [address] [place], [place] [address]  Glucose, capillary     Status: None   Collection Time: 06/09/23 10:25 PM  Result Value Ref Range   Glucose-Capillary 70 70 - 99 mg/dL    Comment: Glucose reference range applies only to  samples taken after fasting for at least 8 hours.  CBC     Status: Abnormal   Collection Time: 06/10/23  1:45 AM  Result Value Ref Range   WBC 4.0 4.0 - 10.5 K/uL   RBC 3.46 (L) 4.22 - 5.81 MIL/uL   Hemoglobin 11.1 (L) 13.0 - 17.0 g/dL   HCT 28.4 (L) 13.2 - 44.0 %  MCV 92.2 80.0 - 100.0 fL   MCH 32.1 26.0 - 34.0 pg   MCHC 34.8 30.0 - 36.0 g/dL   RDW 32.4 40.1 - 02.7 %   Platelets 179 150 - 400 K/uL   nRBC 0.0 0.0 - 0.2 %    Comment: Performed at [facility], [address] [place], [place] [address]  Basic metabolic panel     Status: Abnormal   Collection Time: 06/10/23  1:45 AM  Result Value Ref Range   Sodium 120 (L) 135 - 145 mmol/L   Potassium 4.4 3.5 - 5.1 mmol/L   Chloride 92 (L) 98 - 111 mmol/L   CO2 20 (L) 22 - 32 mmol/L   Glucose, Bld 120 (H) 70 - 99 mg/dL    Comment: Glucose reference range applies only to samples taken after fasting for at least 8 hours.   BUN 13 8 - 23 mg/dL   Creatinine, Ser 4.40 (H) 0.61 - 1.24 mg/dL   Calcium 8.2 (L) 8.9 - 10.3 mg/dL   GFR, Estimated 55 (L) >60 mL/min    Comment: (NOTE) Calculated using the CKD-EPI Creatinine Equation (2021)    Anion gap 8 5 - 15    Comment: Performed at [facility], [address], [place], [place] [address]  Sodium     Status: Abnormal   Collection Time: 06/10/23  1:45 AM  Result Value Ref Range   Sodium 121 (L) 135 - 145 mmol/L    Comment: Performed at [facility], [address] [place], [place] [address]  Glucose, capillary     Status: Abnormal   Collection Time: 06/10/23  2:51 AM  Result Value Ref Range   Glucose-Capillary 114 (H) 70 - 99 mg/dL    Comment: Glucose reference range applies only to samples taken after fasting for at least 8 hours.  Sodium     Status: Abnormal   Collection Time: 06/10/23  7:03 AM  Result Value Ref Range   Sodium 120 (L) 135 - 145 mmol/L    Comment: Performed at [facility], [address] [place], [place] [address]  Glucose, capillary     Status: Abnormal    Collection Time: 06/10/23  7:32 AM  Result Value Ref Range   Glucose-Capillary 103 (H) 70 - 99 mg/dL    Comment: Glucose reference range applies only to samples taken after fasting for at least 8 hours.  Sodium     Status: Abnormal   Collection Time: 06/10/23 10:43 AM  Result Value Ref Range   Sodium 118 (LL) 135 - 145 mmol/L    Comment: CRITICAL RESULT CALLED TO, READ BACK BY AND VERIFIED WITH ROOS,G AT 11:20 ON 06/10/23 BY PURDIE,J Performed at [facility], [address] [place], [place] [address]   Glucose, capillary     Status: Abnormal   Collection Time: 06/10/23 11:22 AM  Result Value Ref Range   Glucose-Capillary 121 (H) 70 - 99 mg/dL    Comment: Glucose reference range applies only to samples taken after fasting for at least 8 hours.   Recent Results (from the past 240 hour(s))  MRSA Next Gen by PCR, Nasal     Status: None   Collection Time: 06/09/23  2:17 PM   Specimen: Nasal Mucosa; Nasal Swab  Result Value Ref Range Status   MRSA by PCR Next Gen NOT DETECTED NOT DETECTED Final    Comment: (NOTE) The GeneXpert MRSA Assay (FDA approved for NASAL specimens only), is one component of a comprehensive MRSA colonization surveillance  program. It is not intended to diagnose MRSA infection nor to guide or monitor treatment for MRSA infections. Test performance is not FDA approved in patients less than 74 years old. Performed at [facility], [address], [place], [place] [address]    Creatinine: Recent Labs    06/08/23 1132 06/09/23 0854 06/10/23 0145  CREATININE 1.58* 1.69* 1.37*   Baseline Creatinine: 1  Impression/Assessment:  71yo with prostate/seminal vesical mass  Plan:  The patient and I talked about etiologies of prostate/seminal vesical masses. We discussed the workup including prostate biopsy and the patient elects to proceed with biopsy. The biopsy will be scheduled as an outpatient.       All of the risks and benefits along with alternatives to prostate  biopsy were discussed with the patient.  The patient gave fully informed consent to proceed with a transrectal ultrasound guided biopsy of the prostate for the evaluation of their prostate mass.     Wilkie Aye 06/10/2023, 2:06 PM

## 2023-06-10 NOTE — Progress Notes (Signed)
Na at 10:43 AM was 118 Adding Lasix for augmentation of free water excretion  Continue to follow.    Bufford Buttner MD [employer] [facility] [phone]

## 2023-06-10 NOTE — Care Management Important Message (Signed)
Important Message  Patient Details  Name: Chase Briggs MRN: [id number] Date of Birth: 1950-12-16   Important Message Given:  N/A - LOS <3 / Initial given by admissions     Corey Harold 06/10/2023, 12:00 PM

## 2023-06-10 NOTE — Plan of Care (Signed)

## 2023-06-10 NOTE — Progress Notes (Signed)
PROGRESS NOTE   Chase Briggs  WNU:[id number] DOB: May 27, 1951 DOA: 06/09/2023 PCP: Anabel Halon, MD   Chief Complaint  Patient presents with   Abnormal Labs   Level of care: Stepdown  Brief Admission History:  72 year old male longtime active smoker with peripheral vascular disease status post bilateral external iliac stents, diabetes mellitus, GERD, hypertension, hyperlipidemia, chronic kidney disease stage IIIb, fatty liver, GERD, diverticulosis was sent by his primary care provider with concern for abnormal labs.  He was taken off of hydrochlorothiazide yesterday after learning his sodium was down to 117.  Patient says that he has been taking hydrochlorothiazide for many years but has not been having his labs checked regularly.  Patient reports that he has been feeling unwell for the past several months.  He has lost about 20 pounds over the last 2 months.  He has very poor appetite.  He has fatigue and malaise.  He has been having testicular pain and has been having nocturia.  He reports a weak urinary stream.  He saw urology earlier this month.  He was recently treated for prostatitis with Bactrim DS but had to stop the medication due to abnormal skin side effects and concern for Stevens-Dali Kraner syndrome.  In ED patient noted to have sodium of 119.  CT abdomen demonstrated a rim-enhancing lesion involving the right prostate gland that was very suspicious concerning for malignancy versus abscess.  Urology was consulted.  Patient is being admitted for further management of severe hyponatremia and new findings of prostatic abnormality concerning for malignancy.   Assessment and Plan:  Severe Hyponatremia - Pt is symptomatic and presented with Na of 119, initially improved to 123 now trending down 118 - plan for slow correction over next couple of days - suspect secondary to hydrochlorothiazide and possible SIADH of malignancy (prostate?) - continue with sodium testing every 4  hours - appreciate nephrology consultation, starting on salt tabs, hold IVF  - follow closely in stepdown ICU setting    Essential hypertension  - hydrochlorothiazide has been discontinued  - ARB was discontinued recently due to lip swelling symptoms - amlodipine 5 mg daily    Prostate mass/lesion  - highly suspicious for malignancy vs abscess  - PSA WNL - urology consultation reportedly planning OP biopsy - discussed CT findings with patient and family at bedside   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 - follow up A1c - 6.3%  - resumed home basal insulin  - monitor CBG carefully, 5x per day - continue prandial coverage  CBG (last 3)  Recent Labs    06/10/23 0251 06/10/23 0732 06/10/23 1122  GLUCAP 114* 103* 121*    PVD s/p iliac stent placement - temporarily held plavix until urology can see him in case he needs procedures but reports he will have OP biopsy, resume plavix    Stage 3b CKD - follow renal function with hydration - renally dose medication as needed   Tobacco use with COPD  - nicotine patch was ordered;  - counseled patient to stop all tobacco use especially given new CT findings - nursing to provide further tobacco cessation information - bronchodilators ordered as needed    Abnormal weight loss - reports unintentional 20 pound weight loss - prostate cancer work up per urology  - follow up PSA - nutritional supplements ordered   DVT prophylaxis: SQ heparin  Code Status: Full  Family Communication: bedside update 9/26  Disposition: TBD    Consultants:  Nephrology urology Procedures:  Antimicrobials:    Subjective: Pt reports he is not sleeping well..still with poor appetite  Objective: Vitals:   06/10/23 0944 06/10/23 1000 06/10/23 1100 06/10/23 1139  BP: (!) 164/54 (!) 153/51 (!) 142/64   Pulse: 84 87 85   Resp: 20 19 20    Temp:    98.5 F (36.9 C)  TempSrc:    Oral  SpO2: 98% 100% 99%   Weight:       Height:        Intake/Output Summary (Last 24 hours) at 06/10/2023 1227 Last data filed at 06/10/2023 1157 Gross per 24 hour  Intake 1924.48 ml  Output 1170 ml  Net 754.48 ml   Filed Weights   06/09/23 0855  Weight: 56.2 kg   Examination:  General exam: cachectic, Appears calm and comfortable  Respiratory system: Clear to auscultation. Respiratory effort normal. Cardiovascular system: normal S1 & S2 heard. No JVD, murmurs, rubs, gallops or clicks. No pedal edema. Gastrointestinal system: Abdomen is nondistended, soft and nontender. No organomegaly or masses felt. Normal bowel sounds heard. Central nervous system: Alert and oriented. No focal neurological deficits. Extremities: Symmetric 5 x 5 power. Skin: No rashes, lesions or ulcers. Psychiatry: Judgement and insight appear normal. Mood & affect flat.   Data Reviewed: I have personally reviewed following labs and imaging studies  CBC: Recent Labs  Lab 06/08/23 1132 06/09/23 0854 06/10/23 0145  WBC 6.4 4.6 4.0  NEUTROABS 4.4 3.3  --   HGB 11.8* 12.1* 11.1*  HCT 36.3* 35.8* 31.9*  MCV 99* 93.5 92.2  PLT 241 207 179    Basic Metabolic Panel: Recent Labs  Lab 06/08/23 1132 06/09/23 0854 06/09/23 1535 06/09/23 1807 06/09/23 2220 06/10/23 0145 06/10/23 0703 06/10/23 1043  NA 117* 119*   < > 121* 122* 120*  121* 120* 118*  K 5.7* 4.6  --   --   --  4.4  --   --   CL 84* 85*  --   --   --  92*  --   --   CO2 20 20*  --   --   --  20*  --   --   GLUCOSE 202* 187*  --   --   --  120*  --   --   BUN 16 17  --   --   --  13  --   --   CREATININE 1.58* 1.69*  --   --   --  1.37*  --   --   CALCIUM 9.1 9.0  --   --   --  8.2*  --   --    < > = values in this interval not displayed.    CBG: Recent Labs  Lab 06/09/23 1631 06/09/23 2225 06/10/23 0251 06/10/23 0732 06/10/23 1122  GLUCAP 121* 70 114* 103* 121*    Recent Results (from the past 240 hour(s))  MRSA Next Gen by PCR, Nasal     Status: None    Collection Time: 06/09/23  2:17 PM   Specimen: Nasal Mucosa; Nasal Swab  Result Value Ref Range Status   MRSA by PCR Next Gen NOT DETECTED NOT DETECTED Final    Comment: (NOTE) The GeneXpert MRSA Assay (FDA approved for NASAL specimens only), is one component of a comprehensive MRSA colonization surveillance program. It is not intended to diagnose MRSA infection nor to guide or monitor treatment for MRSA infections. Test performance is not FDA approved in patients less than 57 years old.  Performed at [facility], [address], [place], [place] [address]      Radiology Studies: CT ABDOMEN PELVIS W CONTRAST  Result Date: 06/09/2023 CLINICAL DATA:  Nonlocalized abdominal pain and weight loss. EXAM: CT ABDOMEN AND PELVIS WITH CONTRAST TECHNIQUE: Multidetector CT imaging of the abdomen and pelvis was performed using the standard protocol following bolus administration of intravenous contrast. RADIATION DOSE REDUCTION: This exam was performed according to the departmental dose-optimization program which includes automated exposure control, adjustment of the mA and/or kV according to patient size and/or use of iterative reconstruction technique. CONTRAST:  80mL OMNIPAQUE IOHEXOL 300 MG/ML  SOLN COMPARISON:  01/28/2023 FINDINGS: Lower chest: No acute findings. Hepatobiliary: No suspicious focal abnormality within the liver parenchyma. Small area of low attenuation in the anterior liver, adjacent to the falciform ligament, is in a characteristic location for focal fatty deposition. Gallbladder is nondistended. No intrahepatic or extrahepatic biliary dilation. Pancreas: No focal mass lesion. No dilatation of the main duct. No intraparenchymal cyst. No peripancreatic edema. Spleen: No splenomegaly. No suspicious focal mass lesion. Adrenals/Urinary Tract: No adrenal nodule or mass. Tiny cyst noted lower pole left kidney. Right kidney unremarkable. No evidence for hydroureter. The urinary bladder appears  normal for the degree of distention. Stomach/Bowel: Stomach is unremarkable. No gastric wall thickening. No evidence of outlet obstruction. Duodenum is normally positioned as is the ligament of Treitz. No small bowel wall thickening. No small bowel dilatation. The terminal ileum is normal. The appendix is normal. No gross colonic mass. No colonic wall thickening. Vascular/Lymphatic: There is moderate atherosclerotic calcification of the abdominal aorta without aneurysm. Advanced atherosclerotic disease noted in the common and external iliac arteries bilaterally with evidence of bilateral external iliac artery stent placement. There is no gastrohepatic or hepatoduodenal ligament lymphadenopathy. No retroperitoneal or mesenteric lymphadenopathy. No pelvic sidewall lymphadenopathy. Reproductive: Prostate gland is mildly enlarged and heterogeneous. There is a rim enhancing collection immediately adjacent to or potentially involving the parenchyma of the posterior and superior aspect of the right prostate gland. This obscures the right seminal vesicle and tracks posterolaterally in the right pelvic floor measuring on the order of 5.3 x 4.9 x 4.3 cm (image 62/2). This lesion is contiguous with the anterior wall of the low rectum. Other: No intraperitoneal free fluid. Musculoskeletal: No worrisome lytic or sclerotic osseous abnormality. IMPRESSION: 1. 5.3 x 4.9 x 4.3 cm rim enhancing collection immediately adjacent to or potentially involving the parenchyma of the posterior and superior aspect of the right prostate gland. This obscures the right seminal vesicle and tracks posterolaterally in the right pelvic floor. This lesion is contiguous with the anterior wall of the low rectum. Imaging features could be compatible with an abscess, potentially arising from the posterior prostate gland or right seminal vesicle. Rectal origin not excluded. Necrotic neoplasm in the central pelvis/pelvic floor would also be a consideration.  2.  Aortic Atherosclerosis (ICD10-I70.0). Electronically Signed   By: Kennith Center M.D.   On: 06/09/2023 12:00    Scheduled Meds:  alfuzosin  10 mg Oral QHS   amLODipine  5 mg Oral Daily   aspirin EC  81 mg Oral Q breakfast   atorvastatin  10 mg Oral QPM   Chlorhexidine Gluconate Cloth  6 each Topical Q0600   heparin  5,000 Units Subcutaneous Q8H   insulin aspart  0-9 Units Subcutaneous TID WC   insulin aspart  3 Units Subcutaneous TID WC   insulin glargine-yfgn  10 Units Subcutaneous Daily   nicotine  21 mg  Transdermal Daily   sodium chloride  1 g Oral BID WC   traZODone  50 mg Oral QHS   Continuous Infusions:   LOS: 1 day   Critical Care Procedure Note Authorized and Performed by: Maryln Manuel MD  Total Critical Care time:  48 mins Due to a high probability of clinically significant, life threatening deterioration, the patient required my highest level of preparedness to intervene emergently and I personally spent this critical care time directly and personally managing the patient.  This critical care time included obtaining a history; examining the patient, pulse oximetry; ordering and review of studies; arranging urgent treatment with development of a management plan; evaluation of patient's response of treatment; frequent reassessment; and discussions with other providers.  This critical care time was performed to assess and manage the high probability of imminent and life threatening deterioration that could result in multi-organ failure.  It was exclusive of separately billable procedures and treating other patients and teaching time.    Standley Dakins, MD How to contact the [facility] Attending or Consulting provider 7A - 7P or covering provider during after hours 7P -7A, for this patient?  Check the care team in [facility] and look for a) attending/consulting TRH provider listed and b) the [facility] team listed Log into [email or web address] and use [place] [facility]'s universal password to access. If you do  not have the password, please contact the hospital operator. Locate the [facility] provider you are looking for under [facility] and page to a number that you can be directly reached. If you still have difficulty reaching the provider, please page the [facility] (Director on Call) for the Hospitalists listed on amion for assistance.  06/10/2023, 12:27 PM

## 2023-06-10 NOTE — TOC Initial Note (Signed)
Transition of Care [facility]) - Initial/Assessment Note    Patient Details  Name: Chase Briggs MRN: [id number] Date of Birth: 1951/09/11  Transition [name], LCSW Phone Number: 06/10/2023, 3:13 PM  Clinical Narrative:                  Pt admitted from home. He has a high readmission risk score.   Plan is for return to home at dc. MD states dc in 3-4 days. Pt is independent in ADLs at home. He is able to get to appointments and obtain meds as needed.  TOC will follow and continue to assess and assist with dc planning.  Expected Discharge Plan: Home/Self Care Barriers to Discharge: Continued Medical Work up   Patient Goals and CMS Choice Patient states their goals for this hospitalization and ongoing recovery are:: go home          Expected Discharge Plan and Services In-house Referral: Clinical Social Work     Living arrangements for the past 2 months: Single Family Home                                      Prior Living Arrangements/Services Living arrangements for the past 2 months: Single Family Home Lives with:: Spouse Patient language and need for interpreter reviewed:: Yes Do you feel safe going back to the place where you live?: Yes      Need for Family Participation in Patient Care: No (Comment)     Criminal Activity/Legal Involvement Pertinent to Current Situation/Hospitalization: No - Comment as needed  Activities of Daily Living   ADL Screening (condition at time of admission) Does the patient have a NEW difficulty with bathing/dressing/toileting/self-feeding that is expected to last >3 days?: No Does the patient have a NEW difficulty with getting in/out of bed, walking, or climbing stairs that is expected to last >3 days?: No Does the patient have a NEW difficulty with communication that is expected to last >3 days?: No Is the patient deaf or have difficulty hearing?: No Does the patient have  difficulty seeing, even when wearing glasses/contacts?: No Does the patient have difficulty concentrating, remembering, or making decisions?: No  Permission Sought/Granted                  Emotional Assessment       Orientation: : Oriented to Self, Oriented to Place, Oriented to  Time, Oriented to Situation Alcohol / Substance Use: Tobacco Use Psych Involvement: No (comment)  Admission diagnosis:  Hyponatremia [E87.1] Prostate mass [N42.89] Patient Active Problem List   Diagnosis Date Noted   Hyponatremia 06/09/2023   Prostate mass 06/09/2023   Abnormal weight loss 06/09/2023   Poor appetite 06/09/2023   Stage 3b chronic kidney disease (CKD) (HCC) 06/09/2023   Hyperglycemia 06/09/2023   Normocytic anemia 06/09/2023   Actinic cheilitis 05/19/2023   Epididymo-orchitis 04/27/2023   Insulin long-term use (HCC) 02/25/2023   Lip swelling 02/10/2023   Thrush 02/10/2023   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   Left inguinal hernia 01/20/2023   Encounter for examination following treatment at hospital 01/20/2023   Centrilobular emphysema (HCC) 11/26/2022   Lumbar herniated disc 09/15/2022   Bilateral sciatica 09/15/2022   Hospital discharge follow-up 04/30/2022   Atypical chest pain 04/23/2022   Current smoker 07/22/2021   Iron deficiency anemia 07/22/2021   PAD (peripheral artery disease) (HCC) 07/22/2021  Allergic rhinitis 07/22/2021   Insomnia 12/12/2019   Bilateral carpal tunnel syndrome 11/23/2018   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 [place] [address] Phone:  [phone] Fax: [phone]  [facility] [phone] - [place], [place] - [address] AT [facility] [address] [place] [address] Phone: [phone] Fax: [phone]     Social Determinants of Health (SDOH) Social History: SDOH Screenings   Food Insecurity: No Food Insecurity (06/09/2023)  Housing: Low Risk  (06/09/2023)  Transportation Needs: No Transportation Needs (06/09/2023)  Utilities: Not At Risk (06/09/2023)  Depression (PHQ2-9): Medium Risk (06/08/2023)  Tobacco Use: High Risk (06/09/2023)   SDOH Interventions:     Readmission Risk Interventions    06/10/2023    1:04 PM  Readmission Risk Prevention Plan  Transportation Screening Complete  HRI or Home Care Consult Complete  Social Work Consult for Recovery Care Planning/Counseling Complete  Palliative Care Screening Not Applicable  Medication Review Oceanographer) Complete

## 2023-06-10 NOTE — Progress Notes (Signed)
Date and time results received: 06/10/23 1121 (use smartphrase ".now" to insert current time)  Test: Sodium Critical Value: 118  Name of Provider Notified: Dr. Laural Benes  Orders Received? Or Actions Taken?: MD notified, awaiting orders

## 2023-06-11 ENCOUNTER — Inpatient Hospital Stay (HOSPITAL_COMMUNITY): Payer: Medicare Other

## 2023-06-11 DIAGNOSIS — R5382 Chronic fatigue, unspecified: Secondary | ICD-10-CM | POA: Insufficient documentation

## 2023-06-11 DIAGNOSIS — E871 Hypo-osmolality and hyponatremia: Secondary | ICD-10-CM | POA: Diagnosis not present

## 2023-06-11 DIAGNOSIS — I739 Peripheral vascular disease, unspecified: Secondary | ICD-10-CM | POA: Diagnosis not present

## 2023-06-11 DIAGNOSIS — E782 Mixed hyperlipidemia: Secondary | ICD-10-CM | POA: Diagnosis not present

## 2023-06-11 DIAGNOSIS — R55 Syncope and collapse: Secondary | ICD-10-CM | POA: Diagnosis present

## 2023-06-11 DIAGNOSIS — R634 Abnormal weight loss: Secondary | ICD-10-CM | POA: Diagnosis not present

## 2023-06-11 LAB — BASIC METABOLIC PANEL
Anion gap: 7 (ref 5–15)
BUN: 11 mg/dL (ref 8–23)
CO2: 21 mmol/L — ABNORMAL LOW (ref 22–32)
Calcium: 8.4 mg/dL — ABNORMAL LOW (ref 8.9–10.3)
Chloride: 93 mmol/L — ABNORMAL LOW (ref 98–111)
Creatinine, Ser: 1.06 mg/dL (ref 0.61–1.24)
GFR, Estimated: >60 mL/min (ref >60)
Glucose, Bld: 113 mg/dL — ABNORMAL HIGH (ref 70–99)
Potassium: 4.2 mmol/L (ref 3.5–5.1)
Sodium: 121 mmol/L — ABNORMAL LOW (ref 135–145)

## 2023-06-11 LAB — GLUCOSE, CAPILLARY
Glucose-Capillary: 117 mg/dL — ABNORMAL HIGH (ref 70–99)
Glucose-Capillary: 124 mg/dL — ABNORMAL HIGH (ref 70–99)
Glucose-Capillary: 143 mg/dL — ABNORMAL HIGH (ref 70–99)
Glucose-Capillary: 143 mg/dL — ABNORMAL HIGH (ref 70–99)
Glucose-Capillary: 110 mg/dL — ABNORMAL HIGH (ref 70–99)
Glucose-Capillary: 267 mg/dL — ABNORMAL HIGH (ref 70–99)

## 2023-06-11 LAB — CBC
HCT: 33.1 % — ABNORMAL LOW (ref 39.0–52.0)
Hemoglobin: 11.3 g/dL — ABNORMAL LOW (ref 13.0–17.0)
MCH: 31.5 pg (ref 26.0–34.0)
MCHC: 34.1 g/dL (ref 30.0–36.0)
MCV: 92.2 fL (ref 80.0–100.0)
Platelets: 187 10*3/uL (ref 150–400)
RBC: 3.59 MIL/uL — ABNORMAL LOW (ref 4.22–5.81)
RDW: 13.2 % (ref 11.5–15.5)
WBC: 4.4 10*3/uL (ref 4.0–10.5)
nRBC: 0 % (ref 0.0–0.2)

## 2023-06-11 LAB — SODIUM
Sodium: 120 mmol/L — ABNORMAL LOW (ref 135–145)
Sodium: 121 mmol/L — ABNORMAL LOW (ref 135–145)
Sodium: 122 mmol/L — ABNORMAL LOW (ref 135–145)
Sodium: 123 mmol/L — ABNORMAL LOW (ref 135–145)
Sodium: 124 mmol/L — ABNORMAL LOW (ref 135–145)

## 2023-06-11 LAB — D-DIMER, QUANTITATIVE: D-Dimer, Quant: 0.62 ug{FEU}/mL — ABNORMAL HIGH (ref 0.00–0.50)

## 2023-06-11 MED ORDER — INSULIN ASPART 100 UNIT/ML IJ SOLN
5.0000 [IU] | Freq: Once | INTRAMUSCULAR | Status: AC
  Administered 2023-06-11: 5 [IU] via SUBCUTANEOUS

## 2023-06-11 MED ORDER — LISINOPRIL 10 MG PO TABS
40.0000 mg | ORAL_TABLET | Freq: Every day | ORAL | Status: DC
  Administered 2023-06-11: 40 mg via ORAL
  Filled 2023-06-11: qty 4

## 2023-06-11 MED ORDER — FUROSEMIDE 10 MG/ML IJ SOLN
40.0000 mg | Freq: Two times a day (BID) | INTRAMUSCULAR | Status: DC
  Administered 2023-06-11 (×2): 40 mg via INTRAVENOUS
  Filled 2023-06-11 (×2): qty 4

## 2023-06-11 MED ORDER — PROCHLORPERAZINE EDISYLATE 10 MG/2ML IJ SOLN: 10.0000 mg | INTRAMUSCULAR | Status: DC | PRN

## 2023-06-11 MED ORDER — CLONIDINE HCL 0.1 MG PO TABS
0.1000 mg | ORAL_TABLET | Freq: Two times a day (BID) | ORAL | Status: DC
  Administered 2023-06-11 (×2): 0.1 mg via ORAL
  Filled 2023-06-11 (×2): qty 1

## 2023-06-11 NOTE — Progress Notes (Signed)
Acute Office Visit  Subjective:    Patient ID: Chase Briggs, male    DOB: 30-Nov-1950, 72 y.o.   MRN: [id number]  Chief Complaint  Patient presents with   dermatology    Follow up from dermatology    Fatigue    Patient has no energy, doesn't feel good, not sleeping, cold chills, nervous     HPI Patient is in today for follow-up of recent lip swelling and fatigue.  He saw dermatology and was given topical steroid and was added advised to avoid OTC lip balm.  His lip lesions have improved compared to prior. He has been having lip swelling and lip lesion since 05/24. Of note, he was placed on Bactrim by urology for epididymoorchitis and Uroxatrol for BPH.  HTN: He takes amlodipine 10 mg QD, clonidine 0.1 mg BID, lisinopril 40 mg QD and HCTZ 12.5 mg QD currently.  Considering his lip swelling, he had been advised to stop lisinopril and switch to olmesartan in the last visit. But he has continued to take lisinopril only and his lip swelling apparently has improved. He reports chronic, intermittent dizziness.  He was told to decrease HCTZ to 12.5 mg, and he has started taking only half tablet of 25 mg QD now. Denies any chest pain or palpitations currently.  He is still taking Bactrim for epididymoorchitis.  He still feels fatigued and has chills.  Denies any recent fever.  He reports lack of appetite and insomnia as well.   Past Medical History:  Diagnosis Date   Diabetes mellitus    Fatty liver    GERD (gastroesophageal reflux disease)    HTN (hypertension)    Hyperlipidemia    Insomnia    Renal insufficiency     Past Surgical History:  Procedure Laterality Date   ABDOMINAL AORTOGRAM W/LOWER EXTREMITY N/A 08/22/2018   Procedure: ABDOMINAL AORTOGRAM W/LOWER EXTREMITY;  Surgeon: [name], MD;  Location: [facility] INVASIVE CV LAB;  Service: Cardiovascular;  Laterality: N/A;   BACK SURGERY     CARPAL TUNNEL WITH CUBITAL TUNNEL Right 03/21/2019   Procedure: RIGHT CARPAL  TUNNEL AND CUBITAL  TUNNEL RELEASES;  Surgeon: [name], MD;  Location: [facility];  Service: Orthopedics;  Laterality: Right;  AXILLARY BLOCK   COLONOSCOPY  01/2007   Dr. [name], reviewed report, mild diverticulosis. Prep adequate. Next TCS due 01/2017   ESOPHAGOGASTRODUODENOSCOPY N/A 10/21/2012   [id number] GASTRITIS   MASS EXCISION Right 10/26/2012   Procedure: EXCISION MASS;  Surgeon: [name], MD;  Location: [facility];  Service: General;  Laterality: Right;   PERIPHERAL VASCULAR INTERVENTION  08/22/2018   Procedure: PERIPHERAL VASCULAR INTERVENTION;  Surgeon: [name], MD;  Location: [facility] INVASIVE CV LAB;  Service: Cardiovascular;;  bilateral external iliac stents   scalp mass  4-5 yrs ago   Dr Angelique Holm NERVE TRANSPOSITION Right 03/21/2019   Procedure: DECOMPRESSION RIGHT ULNAR NERVE;  Surgeon: [name], MD;  Location: [facility];  Service: Orthopedics;  Laterality: Right;    Family History  Problem Relation Age of Onset   Diabetes Mother    Diabetes Father    Diabetes Brother    Colon cancer Neg Hx    Liver disease Neg Hx     Social History   Socioeconomic History   Marital status: Married    Spouse name: Not on file   Number of children: 2   Years of education: Not on file   Highest education level:  Not on file  Occupational History   Occupation: reitred    Comment: [employer], heavy lifting  Tobacco Use   Smoking status: Every Day    Current packs/day: 0.50    Average packs/day: 0.5 packs/day for 35.0 years (17.5 ttl pk-yrs)    Types: Cigarettes   Smokeless tobacco: Never  Vaping Use   Vaping status: Never Used  Substance and Sexual Activity   Alcohol use: No    Comment: hx heavy etoh x 3 yrs, quit 30+ yrs ago   Drug use: No   Sexual activity: Yes    Birth control/protection: None  Other Topics Concern   Not on file  Social History Narrative   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 Highest level of  education: 12th grade. Married for 12 years. Has 2 sons (33 and 21). Lives with wife.   Social Determinants of Health   Financial Resource Strain: Not on file  Food Insecurity: No Food Insecurity (06/09/2023)   Hunger Vital Sign    Worried About Running Out of Food in the Last Year: Never true    Ran Out of Food in the Last Year: Never true  Transportation Needs: No Transportation Needs (06/09/2023)   PRAPARE - Administrator, Civil Service (Medical): No    Lack of Transportation (Non-Medical): No  Physical Activity: Not on file  Stress: Not on file  Social Connections: Not on file  Intimate Partner Violence: Not At Risk (06/09/2023)   Humiliation, Afraid, Rape, and Kick questionnaire    Fear of Current or Ex-Partner: No    Emotionally Abused: No    Physically Abused: No    Sexually Abused: No    No facility-administered medications prior to visit.   Outpatient Medications Prior to Visit  Medication Sig Dispense Refill   lisinopril (ZESTRIL) 40 MG tablet Take 40 mg by mouth daily.     albuterol (VENTOLIN HFA) 108 (90 Base) MCG/ACT inhaler Inhale 2 puffs into the lungs every 6 (six) hours as needed for wheezing or shortness of breath. 20.1 g 0   amLODipine (NORVASC) 10 MG tablet Take 1 tablet (10 mg total) by mouth daily. 90 tablet 3   aspirin EC 81 MG tablet Take 1 tablet (81 mg total) by mouth daily with breakfast. 30 tablet 11   atorvastatin (LIPITOR) 10 MG tablet Take 1 tablet (10 mg total) by mouth daily. 90 tablet 3   cetirizine (ZYRTEC) 10 MG tablet Take 1 tablet (10 mg total) by mouth daily. 30 tablet 0   Cholecalciferol (VITAMIN D) 125 MCG (5000 UT) CAPS Take 2,000 Units by mouth daily. 30 capsule 3   cloNIDine (CATAPRES) 0.1 MG tablet Take 1 tablet (0.1 mg total) by mouth 2 (two) times daily. 180 tablet 3   clopidogrel (PLAVIX) 75 MG tablet TAKE (1) TABLET BY MOUTH ONCE DAILY. 90 tablet 3   EPINEPHrine 0.3 mg/0.3 mL IJ SOAJ injection Inject 0.3 mg into the muscle  as needed for anaphylaxis. 1 each 1   glucose blood test strip 1 each by Other route 2 (two) times daily. Use as instructed bid E11.65 Relion Premier 100 each 5   Lancets MISC 1 each by Does not apply route 2 (two) times daily. E11.65 Relion Premier 100 each 5   LANTUS SOLOSTAR 100 UNIT/ML Solostar Pen INJECT 14 UNITS UNDER THE SKIN AT BEDTIME (Patient taking differently: Inject 14 Units into the skin daily.) 15 mL 3   mupirocin ointment (BACTROBAN) 2 % Apply 1 Application topically  2 (two) times daily. Over upper lip area. 22 g 0   nystatin (MYCOSTATIN) 100000 UNIT/ML suspension Take 5 mLs (500,000 Units total) by mouth 4 (four) times daily. 60 mL 0   RESTASIS 0.05 % ophthalmic emulsion Place 1 drop into both eyes 2 (two) times daily as needed.     sulfamethoxazole-trimethoprim (BACTRIM DS) 800-160 MG tablet Take 1 tablet by mouth every 12 (twelve) hours. 56 tablet 0   SURE COMFORT PEN NEEDLES 32G X 4 MM MISC USE ONCE DAILY 100 each 3   traZODone (DESYREL) 100 MG tablet Take 1 tablet (100 mg total) by mouth daily with breakfast. (Patient taking differently: Take 100 mg by mouth at bedtime.) 90 tablet 1   alfuzosin (UROXATRAL) 10 MG 24 hr tablet Take 1 tablet (10 mg total) by mouth at bedtime. (Patient not taking: Reported on 06/09/2023) 30 tablet 11   benzonatate (TESSALON) 200 MG capsule Take 1 capsule (200 mg total) by mouth 2 (two) times daily as needed for cough. (Patient not taking: Reported on 06/09/2023) 30 capsule 0   cyclobenzaprine (FLEXERIL) 10 MG tablet Take 1 tablet (10 mg total) by mouth 2 (two) times daily as needed for muscle spasms. (Patient not taking: Reported on 01/18/2023) 30 tablet 0   hydrochlorothiazide (HYDRODIURIL) 25 MG tablet Take 0.5 tablets (12.5 mg total) by mouth daily.     olmesartan (BENICAR) 20 MG tablet Take 1 tablet (20 mg total) by mouth daily. 30 tablet 1   promethazine-dextromethorphan (PROMETHAZINE-DM) 6.25-15 MG/5ML syrup Take 5 mLs by mouth 4 (four) times daily  as needed for cough. (Patient not taking: Reported on 01/18/2023) 118 mL 0    No Known Allergies  Review of Systems  Constitutional:  Positive for appetite change, chills and fatigue. Negative for fever.  HENT:  Negative for congestion, postnasal drip and sore throat.   Respiratory:  Negative for cough and shortness of breath.   Cardiovascular:  Negative for chest pain and palpitations.  Gastrointestinal:  Positive for constipation. Negative for diarrhea and vomiting.  Genitourinary:  Negative for dysuria and hematuria.       Right scrotal pain  Musculoskeletal:  Positive for back pain. Negative for neck pain and neck stiffness.  Skin:  Negative for rash.       Lip lesion  Neurological:  Positive for dizziness. Negative for weakness.  Psychiatric/Behavioral:  Positive for sleep disturbance. Negative for agitation and behavioral problems.        Objective:    Physical Exam Vitals reviewed.  Constitutional:      General: He is not in acute distress.    Appearance: He is not diaphoretic.  HENT:     Head: Normocephalic and atraumatic.     Nose: No congestion.     Mouth/Throat:     Lips: Lesions (Lip-erythema and swelling with whitish lesion and skin exfoliation - see image in Media section) present.     Mouth: Mucous membranes are moist.  Eyes:     General: No scleral icterus.    Extraocular Movements: Extraocular movements intact.  Cardiovascular:     Rate and Rhythm: Normal rate and regular rhythm.     Heart sounds: Normal heart sounds. No murmur heard. Pulmonary:     Breath sounds: Normal breath sounds. No wheezing or rales.  Genitourinary:    Testes:        Right: Tenderness (Mild) present.  Musculoskeletal:     Cervical back: Neck supple. No tenderness.     Right lower leg: No  edema.     Left lower leg: No edema.  Skin:    General: Skin is warm.     Findings: No rash.     Comments: Clubbing of fingernails  Neurological:     General: No focal deficit present.      Mental Status: He is alert and oriented to person, place, and time.  Psychiatric:        Mood and Affect: Mood normal.        Behavior: Behavior normal.     BP 108/65 (BP Location: Right Arm, Patient Position: Sitting, Cuff Size: Normal)   Pulse 91   Ht 5\' 7"  (1.702 m)   Wt 124 lb 6.4 oz (56.4 kg)   SpO2 98%   BMI 19.48 kg/m  Wt Readings from Last 3 Encounters:  06/09/23 124 lb (56.2 kg)  06/08/23 124 lb 6.4 oz (56.4 kg)  05/25/23 125 lb 12.8 oz (57.1 kg)        Assessment & Plan:   Problem List Items Addressed This Visit       Cardiovascular and Mediastinum   Essential hypertension - Primary    BP Readings from Last 1 Encounters:  06/11/23 (!) 156/71   Usually well-controlled with amlodipine 10 mg QD, lisinopril 40 mg QD, HCTZ 12.5 mg QD and clonidine 0.1 mg BID  Had given olmesartan instead of lisinopril due to concern for angioedema, but his lip swelling has improved even while taking lisinopril-discontinue olmesartan  Since he had dizziness, discontinue HCTZ to 12.5 mg QD Followed by cardiology Counseled for compliance with the medications Advised DASH diet and moderate exercise/walking, at least 150 mins/week      Relevant Medications   lisinopril (ZESTRIL) 40 MG tablet   Other Relevant Orders   CBC with Differential/Platelet (Completed)   CMP14+EGFR (Completed)     Endocrine   Type 2 diabetes mellitus with other specified complication (HCC)    Lab Results  Component Value Date   HGBA1C 6.3 (H) 06/09/2023   Associated with HTN and PAD Well-controlled with Lantus 14 units every morning, followed by Dr. Fransico Him Advised to follow diabetic diet On statin and ACEi F/u CMP and lipid panel Diabetic eye exam: Advised to follow up with Ophthalmology for diabetic eye exam      Relevant Medications   lisinopril (ZESTRIL) 40 MG tablet   Other Relevant Orders   CMP14+EGFR (Completed)     Other   Lip swelling    Was likely due to allergic reaction to OTC lip  balm Has been evaluated by dermatology Has mupirocin and topical ointment      Chronic fatigue    Check CBC, CMP, TSH + free T4 Has had recurrent epididymoorchitis recently, on Bactrim currently  Addendum: His CMP showed severe hyponatremia, he was advised to go to ER - has been admitted for workup for hyponatremia and prostate mass      Relevant Orders   CBC with Differential/Platelet (Completed)   CMP14+EGFR (Completed)   TSH + free T4 (Completed)   B12 (Completed)   Other Visit Diagnoses     Encounter for immunization       Relevant Orders   Flu Vaccine Trivalent High Dose (Fluad) (Completed)        No orders of the defined types were placed in this encounter.    Anabel Halon, MD

## 2023-06-11 NOTE — Progress Notes (Signed)
[facility] Progress Note   Assessment/ Plan:    Hyponatremia:  Initial osms: serum osms 266, urine osms 248, Urine Na 66.  BP is not low.             - appears to be mixed picture between effect of hydrochlorothiazide, possible SIADH,              - initially improved with IVFs, now has drifted down to 120             - have stopped IVFs, added salt tabs and fluid restriction             - agree with q 4 hr Na             - increase Lasix to 40 IV BID  - improving  - will likely need another 24 hrs at least here  - would not restart hydrochlorothiazide on d/c  - hopefully will not need salt tabs permanently either- maybe a 2 week supply on d/c   2.  Possible Prostate Ca:             - urology has seen per pt, plan for OP biopsy   3.  HTN:             - off hydrochlorothiazide             - on amlodipine   4.  Dispo: pending  Subjective:    Na improving with IV Lasix and salt tabs.  Upset that he has to remain here for a little while longer.     Objective:   BP (!) 140/61   Pulse 93   Temp 98.2 F (36.8 C) (Oral)   Resp 19   Ht 5\' 7"  (1.702 m)   Wt 56.2 kg   SpO2 97%   BMI 19.42 kg/m   Intake/Output Summary (Last 24 hours) at 06/11/2023 1202 Last data filed at 06/11/2023 1105 Gross per 24 hour  Intake 240 ml  Output 1550 ml  Net -1310 ml   Weight change:   Physical Exam: GEN NAD, lying flat in bed HEENT EOMI PERRL NECK no overt JVD PULM clear CV RRR ABD soft EXT no LE edema NEUROAAO x 3, noasterixis SKIN  good skin turgor  Imaging: No results found.  Labs: BMET Recent Labs  Lab 06/08/23 1132 06/09/23 0854 06/09/23 1535 06/10/23 0145 06/10/23 0703 06/10/23 1043 06/10/23 1633 06/10/23 1951 06/11/23 0004 06/11/23 0359 06/11/23 0758  NA 117* 119*   < > 120*  121* 120* 118* 121* 120* 122* 121* 123*  K 5.7* 4.6  --  4.4  --   --  4.4  --   --  4.2  --   CL 84* 85*  --  92*  --   --  90*  --   --  93*  --   CO2 20 20*  --  20*   --   --  22  --   --  21*  --   GLUCOSE 202* 187*  --  120*  --   --  174*  --   --  113*  --   BUN 16 17  --  13  --   --  13  --   --  11  --   CREATININE 1.58* 1.69*  --  1.37*  --   --  1.39*  --   --  1.06  --   CALCIUM 9.1 9.0  --  8.2*  --   --  8.5*  --   --  8.4*  --   PHOS  --   --   --   --   --   --  2.6  --   --   --   --    < > = values in this interval not displayed.   CBC Recent Labs  Lab 06/08/23 1132 06/09/23 0854 06/10/23 0145 06/11/23 0359  WBC 6.4 4.6 4.0 4.4  NEUTROABS 4.4 3.3  --   --   HGB 11.8* 12.1* 11.1* 11.3*  HCT 36.3* 35.8* 31.9* 33.1*  MCV 99* 93.5 92.2 92.2  PLT 241 207 179 187    Medications:     alfuzosin  10 mg Oral QHS   amLODipine  5 mg Oral Daily   aspirin EC  81 mg Oral Q breakfast   atorvastatin  10 mg Oral QPM   Chlorhexidine Gluconate Cloth  6 each Topical Q0600   cloNIDine  0.1 mg Oral BID   clopidogrel  75 mg Oral Daily   ferrous sulfate  325 mg Oral Q breakfast   furosemide  40 mg Intravenous BID   heparin  5,000 Units Subcutaneous Q8H   insulin aspart  0-9 Units Subcutaneous TID WC   insulin aspart  3 Units Subcutaneous TID WC   insulin glargine-yfgn  10 Units Subcutaneous Daily   lisinopril  40 mg Oral Daily   nicotine  21 mg Transdermal Daily   sodium chloride  1 g Oral BID WC   traZODone  50 mg Oral QHS    Chase Buttner, MD 06/11/2023, 12:02 PM

## 2023-06-11 NOTE — Plan of Care (Signed)
  Problem: Education: Goal: Knowledge of General Education information will improve Description: Including pain rating scale, medication(s)/side effects and non-pharmacologic comfort measures Outcome: Progressing   Problem: Health Behavior/Discharge Planning: Goal: Ability to manage health-related needs will improve Outcome: Progressing   Problem: Clinical Measurements: Goal: Ability to maintain clinical measurements within normal limits will improve Outcome: Progressing Goal: Will remain free from infection Outcome: Progressing Goal: Diagnostic test results will improve Outcome: Progressing Goal: Respiratory complications will improve Outcome: Progressing Goal: Cardiovascular complication will be avoided Outcome: Progressing   Problem: Activity: Goal: Risk for activity intolerance will decrease Outcome: Not Progressing   Problem: Nutrition: Goal: Adequate nutrition will be maintained Outcome: Not Progressing   Problem: Coping: Goal: Level of anxiety will decrease Outcome: Progressing   Problem: Elimination: Goal: Will not experience complications related to bowel motility Outcome: Progressing Goal: Will not experience complications related to urinary retention Outcome: Progressing   Problem: Pain Managment: Goal: General experience of comfort will improve Outcome: Progressing   Problem: Safety: Goal: Ability to remain free from injury will improve Outcome: Progressing   Problem: Skin Integrity: Goal: Risk for impaired skin integrity will decrease Outcome: Progressing   Problem: Education: Goal: Ability to describe self-care measures that may prevent or decrease complications (Diabetes Survival Skills Education) will improve Outcome: Progressing Goal: Individualized Educational Video(s) Outcome: Progressing   Problem: Coping: Goal: Ability to adjust to condition or change in health will improve Outcome: Progressing   Problem: Fluid Volume: Goal: Ability  to maintain a balanced intake and output will improve Outcome: Progressing   Problem: Health Behavior/Discharge Planning: Goal: Ability to identify and utilize available resources and services will improve Outcome: Progressing Goal: Ability to manage health-related needs will improve Outcome: Progressing   Problem: Metabolic: Goal: Ability to maintain appropriate glucose levels will improve Outcome: Progressing

## 2023-06-11 NOTE — Progress Notes (Signed)
PROGRESS NOTE   Chase Briggs  WUJ:[id number] DOB: Aug 13, 1951 DOA: 06/09/2023 PCP: Anabel Halon, MD   Chief Complaint  Patient presents with   Abnormal Labs   Level of care: Stepdown  Brief Admission History:  72 year old male longtime active smoker with peripheral vascular disease status post bilateral external iliac stents, diabetes mellitus, GERD, hypertension, hyperlipidemia, chronic kidney disease stage IIIb, fatty liver, GERD, diverticulosis was sent by his primary care provider with concern for abnormal labs.  He was taken off of hydrochlorothiazide yesterday after learning his sodium was down to 117.  Patient says that he has been taking hydrochlorothiazide for many years but has not been having his labs checked regularly.  Patient reports that he has been feeling unwell for the past several months.  He has lost about 20 pounds over the last 2 months.  He has very poor appetite.  He has fatigue and malaise.  He has been having testicular pain and has been having nocturia.  He reports a weak urinary stream.  He saw urology earlier this month.  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  In ED patient noted to have sodium of 119.  CT abdomen demonstrated a rim-enhancing lesion involving the right prostate gland that was very suspicious concerning for malignancy versus abscess.  Urology was consulted.  Patient is being admitted for further management of severe hyponatremia and new findings of prostatic abnormality concerning for malignancy.   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 (prostate?) - continue with frequent sodium testing  -  appreciate nephrology consultation, starting on salt tabs, hold IVF, lasix ordered to remove free water - Na up to 123 this morning - follow closely in stepdown ICU setting    Essential hypertension  - hydrochlorothiazide has been discontinued  - he is on lisinopril 40 mg daily and clonidine 0.1 mg BID  - amlodipine 5 mg daily    Prostate mass/lesion  - highly suspicious for malignancy vs abscess  - PSA WNL - urology consultation reportedly planning OP biopsy - discussed CT findings with patient and family at bedside   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 - follow up A1c - 6.3%  - resumed home basal insulin  - monitor CBG carefully, 5x per day - continue prandial coverage  CBG (last 3)  Recent Labs    06/11/23 0224 06/11/23 0727 06/11/23 1109  GLUCAP 117* 124* 143*   PVD s/p iliac stent placement - temporarily held plavix until urology can see him in case he needs procedures but reports he will have OP biopsy, resume plavix    Stage 3b CKD - follow renal function with hydration - renally dose medication as needed   Tobacco use with COPD  - nicotine patch was ordered;  - counseled patient to stop all tobacco use especially given new CT findings - nursing to provide further tobacco cessation information - bronchodilators ordered as needed    Abnormal weight loss - reports unintentional 20 pound weight loss - prostate cancer work up per urology  - follow up PSA - nutritional supplements ordered   DVT prophylaxis: SQ heparin  Code Status: Full  Family Communication:  bedside update 9/26, 9/27  Disposition: TBD    Consultants:  Nephrology urology Procedures:   Antimicrobials:    Subjective: Pt is eager to discharge, not happy having to stay longer;    Objective: Vitals:   06/11/23 0944 06/11/23 1000 06/11/23 1100 06/11/23 1105  BP: (!) 145/60 (!) 149/70 (!) 140/61   Pulse:  88 93   Resp:  18 19   Temp:    98.2 F (36.8  C)  TempSrc:    Oral  SpO2:  100% 97%   Weight:      Height:        Intake/Output Summary (Last 24 hours) at 06/11/2023 1151 Last data filed at 06/11/2023 1105 Gross per 24 hour  Intake 480 ml  Output 1550 ml  Net -1070 ml   Filed Weights   06/09/23 0855  Weight: 56.2 kg   Examination:  General exam: cachectic, Appears calm and comfortable  Respiratory system: Clear to auscultation. Respiratory effort normal. Cardiovascular system: normal S1 & S2 heard. No JVD, murmurs, rubs, gallops or clicks. No pedal edema. Gastrointestinal system: Abdomen is nondistended, soft and nontender. No organomegaly or masses felt. Normal bowel sounds heard. Central nervous system: Alert and oriented. No focal neurological deficits. Extremities: Symmetric 5 x 5 power. Skin: No rashes, lesions or ulcers. Psychiatry: Judgement and insight appear normal. Mood & affect flat.   Data Reviewed: I have personally reviewed following labs and imaging studies  CBC: Recent Labs  Lab 06/08/23 1132 06/09/23 0854 06/10/23 0145 06/11/23 0359  WBC 6.4 4.6 4.0 4.4  NEUTROABS 4.4 3.3  --   --   HGB 11.8* 12.1* 11.1* 11.3*  HCT 36.3* 35.8* 31.9* 33.1*  MCV 99* 93.5 92.2 92.2  PLT 241 207 179 187    Basic Metabolic Panel: Recent Labs  Lab 06/08/23 1132 06/09/23 0854 06/09/23 1535 06/10/23 0145 06/10/23 0703 06/10/23 1633 06/10/23 1951 06/11/23 0004 06/11/23 0359 06/11/23 0758  NA 117* 119*   < > 120*  121*   < > 121* 120* 122* 121* 123*  K 5.7* 4.6  --  4.4  --  4.4  --   --  4.2  --   CL 84* 85*  --  92*  --  90*  --   --  93*  --   CO2 20 20*  --  20*  --  22  --   --  21*  --   GLUCOSE 202* 187*  --  120*  --  174*  --   --  113*  --   BUN 16 17  --  13  --  13  --   --  11  --   CREATININE 1.58* 1.69*  --  1.37*  --  1.39*  --   --  1.06  --   CALCIUM 9.1 9.0  --  8.2*  --  8.5*  --   --  8.4*  --   PHOS  --   --   --   --   --  2.6  --   --   --   --    < > = values in this interval not  displayed.    CBG: Recent Labs  Lab 06/10/23 1654 06/10/23 2243 06/11/23 0224 06/11/23 0727 06/11/23 1109  GLUCAP 153* 111* 117* 124* 143*    Recent Results (from the past 240 hour(s))  MRSA Next Gen by PCR, Nasal     Status: None   Collection Time: 06/09/23  2:17 PM   Specimen: Nasal Mucosa; Nasal Swab  Result Value Ref Range Status   MRSA by PCR Next Gen NOT DETECTED NOT DETECTED Final    Comment: (NOTE) The GeneXpert MRSA Assay (FDA approved for NASAL specimens only), is one component of a comprehensive MRSA colonization surveillance program. It is not intended to diagnose MRSA infection nor to guide or monitor treatment for MRSA infections. Test performance is not FDA approved in patients less than 42 years old. Performed at [facility], [address], [place], [place] [address]      Radiology Studies: No results found.  Scheduled Meds:  alfuzosin  10 mg Oral QHS   amLODipine  5 mg Oral Daily   aspirin EC  81 mg Oral Q breakfast   atorvastatin  10 mg Oral QPM   Chlorhexidine Gluconate Cloth  6 each Topical Q0600   cloNIDine  0.1 mg Oral BID   clopidogrel  75 mg Oral Daily   ferrous sulfate  325 mg Oral Q breakfast   furosemide  40 mg Intravenous BID   heparin  5,000 Units Subcutaneous Q8H   insulin aspart  0-9 Units Subcutaneous TID WC   insulin aspart  3 Units Subcutaneous TID WC   insulin glargine-yfgn  10 Units Subcutaneous Daily   lisinopril  40 mg Oral Daily   nicotine  21 mg Transdermal Daily   sodium chloride  1 g Oral BID WC   traZODone  50 mg Oral QHS   Continuous Infusions:   LOS: 2 days   Time spent: 55 mins  Perlie Scheuring Laural Benes, MD How to contact the [facility] Attending or Consulting provider 7A - 7P or covering provider during after hours 7P -7A, for this patient?  Check the care team in [facility] and look for a) attending/consulting TRH provider listed and b) the [facility] team listed Log into [email or web address] and use [place]'s universal password to access.  If you do not have the password, please contact the hospital operator. Locate the [facility] provider you are looking for under [facility] and page to a number that you can be directly reached. If you still have difficulty reaching the provider, please page the [facility] (Director on Call) for the Hospitalists listed on amion for assistance.  06/11/2023, 11:51 AM

## 2023-06-11 NOTE — Assessment & Plan Note (Signed)
Was likely due to allergic reaction to OTC lip balm Has been evaluated by dermatology Has mupirocin and topical ointment

## 2023-06-11 NOTE — Assessment & Plan Note (Signed)
BP Readings from Last 1 Encounters:  06/11/23 (!) 156/71   Usually well-controlled with amlodipine 10 mg QD, lisinopril 40 mg QD, HCTZ 12.5 mg QD and clonidine 0.1 mg BID  Had given olmesartan instead of lisinopril due to concern for angioedema, but his lip swelling has improved even while taking lisinopril-discontinue olmesartan  Since he had dizziness, discontinue HCTZ to 12.5 mg QD Followed by cardiology Counseled for compliance with the medications Advised DASH diet and moderate exercise/walking, at least 150 mins/week

## 2023-06-11 NOTE — Progress Notes (Signed)
Got patient up to the chair. Once in the chair, patient began to complain of lightheadedness. Soon after, patient went unresponsive and pulse dropped from 100's to 60's. Patient became very diaphoretic and had an incontinent urine episode. Patient was unresponsive for 30-45 seconds before coming to. Patient was able to tell me his name, where he was, and the current month with no problem. EKG completed. Dr. Laural Benes made aware of situation and came to assess patient. Wife is at bedside.

## 2023-06-11 NOTE — Progress Notes (Addendum)
06/11/2023 12:21 PM  Called to see patient after acute change in room, appears to have had a vasovagal event, HR dropped down to 50s, he became diaphoretic, nauseated, incontinent of urine; reportedly lasted about 30 secs, now says he feels "hot and nauseated"  Pt denies chest pain but some SOB.  Blood glucose 143.  EKG reviewed, no acute findings seen. He is now back in bed and resting comfortably.  Lungs sound clear, heart sounds normal, pulse now at 70s.  Will follow up CXR.  Unfortunately sodium down to 121 again.  He got lasix this morning but not much output so far.   Pt had been refusing DVT prophylaxis medication.  He could have a DVT/PE.  Will check d dimer and venous dopplers of legs.  Will follow.  Wife and patient updated at bedside.        Vitals:   06/11/23 1200 06/11/23 1206  BP:  (!) 147/73  Pulse: 83 61  Resp: (!) 21 19  Temp:    SpO2: 100% 100%     Critical Care Procedure Note Authorized and Performed by: Maryln Manuel MD  Total Critical Care time:  35 mins Due to a high probability of clinically significant, life threatening deterioration, the patient required my highest level of preparedness to intervene emergently and I personally spent this critical care time directly and personally managing the patient.  This critical care time included obtaining a history; examining the patient, pulse oximetry; ordering and review of studies; arranging urgent treatment with development of a management plan; evaluation of patient's response of treatment; frequent reassessment; and discussions with other providers.  This critical care time was performed to assess and manage the high probability of imminent and life threatening deterioration that could result in multi-organ failure.  It was exclusive of separately billable procedures and treating other patients and teaching time.   Maryln Manuel, MD  How to contact the [facility] Attending or Consulting provider 7A - 7P or covering provider during after  hours 7P -7A, for this patient?  Check the care team in [facility] and look for a) attending/consulting TRH provider listed and b) the [facility] team listed Log into [email or web address] and use 's universal password to access. If you do not have the password, please contact the hospital operator. Locate the [facility] provider you are looking for under [facility] and page to a number that you can be directly reached. If you still have difficulty reaching the provider, please page the [facility] (Director on Call) for the Hospitalists listed on amion for assistance.

## 2023-06-11 NOTE — Progress Notes (Signed)
Patient's heart rate will jump up to sinus tach 120-140's while sitting on the edge of the bed or standing. Goes back to sinus rhythm once laying back in bed. Dr. Laural Benes made aware.

## 2023-06-11 NOTE — Assessment & Plan Note (Signed)
Lab Results  Component Value Date   HGBA1C 6.3 (H) 06/09/2023   Associated with HTN and PAD Well-controlled with Lantus 14 units every morning, followed by Dr. Emeline Darling to follow diabetic diet On statin and ACEi F/u CMP and lipid panel Diabetic eye exam: Advised to follow up with Ophthalmology for diabetic eye exam

## 2023-06-11 NOTE — Assessment & Plan Note (Signed)
Check CBC, CMP, TSH + free T4 Has had recurrent epididymoorchitis recently, on Bactrim currently  Addendum: His CMP showed severe hyponatremia, he was advised to go to ER - has been admitted for workup for hyponatremia and prostate mass

## 2023-06-12 DIAGNOSIS — E871 Hypo-osmolality and hyponatremia: Secondary | ICD-10-CM | POA: Diagnosis not present

## 2023-06-12 DIAGNOSIS — N1832 Chronic kidney disease, stage 3b: Secondary | ICD-10-CM

## 2023-06-12 DIAGNOSIS — R634 Abnormal weight loss: Secondary | ICD-10-CM | POA: Diagnosis not present

## 2023-06-12 DIAGNOSIS — N179 Acute kidney failure, unspecified: Secondary | ICD-10-CM | POA: Diagnosis not present

## 2023-06-12 LAB — BASIC METABOLIC PANEL
Anion gap: 7 (ref 5–15)
BUN: 21 mg/dL (ref 8–23)
CO2: 24 mmol/L (ref 22–32)
Calcium: 8.5 mg/dL — ABNORMAL LOW (ref 8.9–10.3)
Chloride: 92 mmol/L — ABNORMAL LOW (ref 98–111)
Creatinine, Ser: 1.51 mg/dL — ABNORMAL HIGH (ref 0.61–1.24)
GFR, Estimated: 49 mL/min — ABNORMAL LOW (ref >60)
Glucose, Bld: 178 mg/dL — ABNORMAL HIGH (ref 70–99)
Potassium: 4.5 mmol/L (ref 3.5–5.1)
Sodium: 123 mmol/L — ABNORMAL LOW (ref 135–145)

## 2023-06-12 LAB — SODIUM
Sodium: 122 mmol/L — ABNORMAL LOW (ref 135–145)
Sodium: 123 mmol/L — ABNORMAL LOW (ref 135–145)
Sodium: 123 mmol/L — ABNORMAL LOW (ref 135–145)
Sodium: 123 mmol/L — ABNORMAL LOW (ref 135–145)
Sodium: 125 mmol/L — ABNORMAL LOW (ref 135–145)

## 2023-06-12 LAB — CBC
HCT: 33.6 % — ABNORMAL LOW (ref 39.0–52.0)
Hemoglobin: 11.1 g/dL — ABNORMAL LOW (ref 13.0–17.0)
MCH: 31 pg (ref 26.0–34.0)
MCHC: 33 g/dL (ref 30.0–36.0)
MCV: 93.9 fL (ref 80.0–100.0)
Platelets: 201 10*3/uL (ref 150–400)
RBC: 3.58 MIL/uL — ABNORMAL LOW (ref 4.22–5.81)
RDW: 13.4 % (ref 11.5–15.5)
WBC: 7.5 10*3/uL (ref 4.0–10.5)
nRBC: 0 % (ref 0.0–0.2)

## 2023-06-12 LAB — GLUCOSE, CAPILLARY
Glucose-Capillary: 81 mg/dL (ref 70–99)
Glucose-Capillary: 205 mg/dL — ABNORMAL HIGH (ref 70–99)
Glucose-Capillary: 137 mg/dL — ABNORMAL HIGH (ref 70–99)
Glucose-Capillary: 73 mg/dL (ref 70–99)
Glucose-Capillary: 150 mg/dL — ABNORMAL HIGH (ref 70–99)
Glucose-Capillary: 175 mg/dL — ABNORMAL HIGH (ref 70–99)

## 2023-06-12 MED ORDER — FAMOTIDINE 20 MG PO TABS
20.0000 mg | ORAL_TABLET | Freq: Once | ORAL | Status: AC
  Administered 2023-06-12: 20 mg via ORAL
  Filled 2023-06-12: qty 1

## 2023-06-12 MED ORDER — MIDODRINE HCL 5 MG PO TABS
10.0000 mg | ORAL_TABLET | Freq: Three times a day (TID) | ORAL | Status: DC
  Administered 2023-06-12 (×4): 10 mg via ORAL
  Filled 2023-06-12 (×4): qty 2

## 2023-06-12 MED ORDER — PANTOPRAZOLE SODIUM 40 MG PO TBEC
40.0000 mg | DELAYED_RELEASE_TABLET | Freq: Every day | ORAL | Status: DC
  Administered 2023-06-12 – 2023-06-14 (×3): 40 mg via ORAL
  Filled 2023-06-12 (×3): qty 1

## 2023-06-12 MED ORDER — SODIUM CHLORIDE 0.9 % IV BOLUS
500.0000 mL | Freq: Once | INTRAVENOUS | Status: AC
  Administered 2023-06-12: 500 mL via INTRAVENOUS

## 2023-06-12 NOTE — Progress Notes (Signed)
Patient BP dropped down to 43/23(29) and severely low for few episodes when recycled, HR on 60s and irregular, patient reported feeling dizzy and Hot , wanted to have a bowel movement at the same time, positioned in Trendelenburg, Mannual BP taken 90/50, Dr. Carren Rang made aware, NS bolus and Midodrine 10mg  ordered and given,CBG 81, gave a bottle of Ensure, BP improved to 124/55 (75) while on bolus , HR unchanged, positioned back to semi-fowlers, patient answered all questions throughout despite being dizzy. Will continue to monitor.

## 2023-06-12 NOTE — Progress Notes (Signed)
PROGRESS NOTE   Chase Briggs  ZOX:[id number] DOB: Apr 22, 1951 DOA: 06/09/2023 PCP: Anabel Halon, MD   Chief Complaint  Patient presents with   Abnormal Labs   Level of care: Stepdown  Brief Admission History:  72 year old male longtime active smoker with peripheral vascular disease status post bilateral external iliac stents, diabetes mellitus, GERD, hypertension, hyperlipidemia, chronic kidney disease stage IIIb, fatty liver, GERD, diverticulosis was sent by his primary care provider with concern for abnormal labs.  He was taken off of hydrochlorothiazide yesterday after learning his sodium was down to 117.  Patient says that he has been taking hydrochlorothiazide for many years but has not been having his labs checked regularly.  Patient reports that he has been feeling unwell for the past several months.  He has lost about 20 pounds over the last 2 months.  He has very poor appetite.  He has fatigue and malaise.  He has been having testicular pain and has been having nocturia.  He reports a weak urinary stream.  He saw urology earlier this month.  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  In ED patient noted to have sodium of 119.  CT abdomen demonstrated a rim-enhancing lesion involving the right prostate gland that was very suspicious concerning for malignancy versus abscess.  Urology was consulted.  Patient is being admitted for further management of severe hyponatremia and new findings of prostatic abnormality concerning for malignancy.   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 - follow up A1c - 6.3%  - resumed home basal insulin  - monitor CBG carefully, 5x per day - continue prandial coverage  CBG (last 3)  Recent Labs    06/11/23 2058 06/12/23 0730 06/12/23 1138  GLUCAP 267* 205* 137*   PVD s/p iliac stent placement - temporarily held plavix until urology can see him in case he needs procedures but reports he will have OP biopsy, resumed plavix    AKI on Stage 3b CKD - follow renal function with hydration - renally dose medication as needed   Tobacco use with COPD  - nicotine patch was ordered;  - counseled patient to stop all tobacco use especially given new CT findings - nursing to provide further tobacco cessation information - bronchodilators ordered as needed  Abnormal weight loss - reports unintentional 20 pound weight loss - prostate cancer work up per urology  - follow up PSA - nutritional supplements ordered   DVT prophylaxis: SQ heparin  Code Status: Full  Family Communication: bedside update 9/26, 9/27, 9/28  Disposition: TBD    Consultants:  Nephrology urology Procedures:   Antimicrobials:     Subjective: Pt had another episode of hypotension, possibly vasovagal event overnight and required IV fluid bolus, he is feeling better now but he is tired and he has no appetite.     Objective: Vitals:   06/12/23 1000 06/12/23 1030 06/12/23 1035 06/12/23 1131  BP: (!) 124/53 (!) 120/53 (!) 120/53   Pulse: 80 (!) 59    Resp: 17 17    Temp:    98.4 F (36.9 C)  TempSrc:    Oral  SpO2: 99% 97%    Weight:      Height:        Intake/Output Summary (Last 24 hours) at 06/12/2023 1353 Last data filed at 06/12/2023 0900 Gross per 24 hour  Intake 422.48 ml  Output 1350 ml  Net -927.52 ml   Filed Weights   06/09/23 0855  Weight: 56.2 kg   Examination:  General exam: cachectic, Appears calm and comfortable  Respiratory system: Clear to auscultation. Respiratory effort normal. Cardiovascular system: normal S1 & S2 heard. No JVD, murmurs, rubs, gallops or clicks. No pedal edema. Gastrointestinal system: Abdomen is nondistended, soft and nontender. No organomegaly or masses felt. Normal bowel sounds heard. Central nervous system: Alert and oriented. No focal neurological deficits. Extremities: Symmetric 5 x 5 power. Skin: No rashes, lesions or ulcers. Psychiatry: Judgement and insight appear normal. Mood & affect flat.   Data Reviewed: I have personally reviewed following labs and imaging studies  CBC: Recent Labs  Lab 06/08/23 1132 06/09/23 0854 06/10/23 0145 06/11/23 0359 06/12/23 0402  WBC 6.4 4.6 4.0 4.4 7.5  NEUTROABS 4.4 3.3  --   --   --   HGB 11.8* 12.1* 11.1* 11.3* 11.1*  HCT 36.3* 35.8* 31.9* 33.1* 33.6*  MCV 99* 93.5 92.2 92.2 93.9  PLT 241 207 179 187 201    Basic Metabolic Panel: Recent Labs  Lab 06/09/23 0854 06/09/23 1535 06/10/23 0145 06/10/23 0703 06/10/23 1633 06/10/23 1951 06/11/23 0359 06/11/23 0758 06/11/23 1937 06/12/23 0022 06/12/23 0402 06/12/23 0807 06/12/23 1156  NA 119*   < > 120*  121*   < > 121*   < > 121*   < > 120* 122* 123*  123* 123*  K 4.6  --  4.4  --  4.4  --  4.2  --   --   --  4.5  --   --   CL 85*  --  92*  --  90*  --  93*  --   --   --  92*  --   --   CO2 20*  --  20*  --  22  --  21*  --   --   --  24  --   --   GLUCOSE 187*  --  120*  --  174*  --  113*  --   --   --  178*  --   --   BUN 17  --  13  --  13  --  11  --   --   --  21  --   --   CREATININE 1.69*  --  1.37*  --  1.39*  --  1.06  --   --   --  1.51*  --   --   CALCIUM 9.0  --  8.2*  --  8.5*  --  8.4*  --   --   --  8.5*  --   --   PHOS  --   --   --   --  2.6  --   --   --   --   --   --   --   --    < > = values in this interval not displayed.    CBG: Recent Labs  Lab 06/11/23 1154 06/11/23 1607 06/11/23 2058 06/12/23 0730 06/12/23 1138  GLUCAP 143* 110* 267* 205* 137*    Recent Results (from the past 240 hour(s))  MRSA Next Gen by PCR, Nasal     Status: None   Collection Time: 06/09/23  2:17 PM   Specimen: Nasal Mucosa; Nasal Swab  Result Value Ref Range Status   MRSA by PCR Next Gen NOT DETECTED NOT DETECTED Final    Comment: (NOTE) The GeneXpert MRSA Assay (FDA approved for NASAL specimens only), is one component of a comprehensive MRSA colonization surveillance program. It is not intended to diagnose MRSA infection nor to guide or monitor treatment for MRSA infections. Test performance is not FDA approved in patients less than 43 years old. Performed at [facility], [address] [place], [place] [address]      Radiology Studies: DG CHEST PORT 1 VIEW  Result Date: 06/11/2023 CLINICAL DATA:  Altered mental status EXAM: PORTABLE CHEST 1 VIEW COMPARISON:  01/18/2023; 12/25/2022 FINDINGS: Unchanged cardiac silhouette and mediastinal contours. No focal parenchymal opacities. No pleural effusion or pneumothorax. No evidence of edema. No acute osseous abnormalities. Stigmata of dish within the thoracic spine is suspected though incompletely evaluated. IMPRESSION: No acute cardiopulmonary disease. Electronically Signed    By: Simonne Come M.D.   On: 06/11/2023 16:28   US Venous Img Lower Bilateral (DVT)  Result Date: 06/11/2023 CLINICAL DATA:  Shortness of breath. Bilateral lower extremity pain. Evaluate for DVT. EXAM: BILATERAL LOWER EXTREMITY VENOUS DOPPLER ULTRASOUND TECHNIQUE: Gray-scale sonography with graded compression, as well as color Doppler and duplex ultrasound were performed to evaluate the lower extremity deep venous systems from the level of the common femoral vein and including the common femoral, femoral, profunda femoral, popliteal and calf veins including the posterior tibial, peroneal and gastrocnemius veins when visible. The superficial great saphenous vein was also interrogated. Spectral Doppler was utilized to evaluate flow at rest and with distal augmentation maneuvers in the common femoral, femoral and popliteal veins. COMPARISON:  Bilateral lower extremity venous Doppler ultrasound-10/15/2022 (negative) FINDINGS: RIGHT LOWER EXTREMITY Common Femoral Vein: No evidence of thrombus. Normal compressibility, respiratory phasicity and response to augmentation. Saphenofemoral Junction: No evidence of thrombus. Normal compressibility and flow on color Doppler imaging. Profunda Femoral Vein: No evidence of thrombus. Normal compressibility and flow on color Doppler imaging. Femoral Vein: No evidence of thrombus. Normal compressibility, respiratory phasicity and response to augmentation. Popliteal Vein: No evidence of thrombus. Normal compressibility, respiratory phasicity and response to augmentation. Calf Veins: No evidence of thrombus. Normal compressibility and flow on color Doppler imaging. Superficial Great Saphenous Vein: No evidence of thrombus. Normal compressibility. Other Findings:  None. LEFT LOWER EXTREMITY Common Femoral Vein: No evidence of thrombus. Normal compressibility, respiratory phasicity and response to augmentation. Saphenofemoral Junction: No evidence of thrombus. Normal compressibility and  flow on color Doppler imaging. Profunda Femoral  Vein: No evidence of thrombus. Normal compressibility and flow on color Doppler imaging. Femoral Vein: No evidence of thrombus. Normal compressibility, respiratory phasicity and response to augmentation. Popliteal Vein: No evidence of thrombus. Normal compressibility, respiratory phasicity and response to augmentation. Calf Veins: No evidence of thrombus. Normal compressibility and flow on color Doppler imaging. Superficial Great Saphenous Vein: No evidence of thrombus. Normal compressibility. Other Findings:  None. IMPRESSION: No evidence of DVT within either lower extremity. Electronically Signed   By: Simonne Come M.D.   On: 06/11/2023 16:27    Scheduled Meds:  alfuzosin  10 mg Oral QHS   aspirin EC  81 mg Oral Q breakfast   atorvastatin  10 mg Oral QPM   Chlorhexidine Gluconate Cloth  6 each Topical Q0600   clopidogrel  75 mg Oral Daily   famotidine  20 mg Oral Once   ferrous sulfate  325 mg Oral Q breakfast   furosemide  40 mg Intravenous BID   heparin  5,000 Units Subcutaneous Q8H   insulin aspart  0-9 Units Subcutaneous TID WC   insulin aspart  3 Units Subcutaneous TID WC   insulin glargine-yfgn  10 Units Subcutaneous Daily   lisinopril  40 mg Oral Daily   midodrine  10 mg Oral TID WC   nicotine  21 mg Transdermal Daily   pantoprazole  40 mg Oral Q supper   sodium chloride  1 g Oral BID WC   traZODone  50 mg Oral QHS   Continuous Infusions:   LOS: 3 days   Critical Care Procedure Note Authorized and Performed by: Maryln Manuel MD  Total Critical Care time:  62 mins Due to a high probability of clinically significant, life threatening deterioration, the patient required my highest level of preparedness to intervene emergently and I personally spent this critical care time directly and personally managing the patient.  This critical care time included obtaining a history; examining the patient, pulse oximetry; ordering and review of  studies; arranging urgent treatment with development of a management plan; evaluation of patient's response of treatment; frequent reassessment; and discussions with other providers.  This critical care time was performed to assess and manage the high probability of imminent and life threatening deterioration that could result in multi-organ failure.  It was exclusive of separately billable procedures and treating other patients and teaching time.    Standley Dakins, MD How to contact the [facility] Attending or Consulting provider 7A - 7P or covering provider during after hours 7P -7A, for this patient?  Check the care team in [facility] and look for a) attending/consulting [facility] provider listed and b) the [facility] team listed Log into [email or web address] and use [facility] universal password to access. If you do not have the password, please contact the hospital operator. Locate the [facility] provider you are looking for under [facility] and page to a number that you can be directly reached. If you still have difficulty reaching the provider, please page the [facility] (Director on Call) for the Hospitalists listed on amion for assistance.  06/12/2023, 1:53 PM

## 2023-06-12 NOTE — Plan of Care (Signed)
  Problem: Education: Goal: Knowledge of General Education information will improve Description: Including pain rating scale, medication(s)/side effects and non-pharmacologic comfort measures Outcome: Progressing   Problem: Health Behavior/Discharge Planning: Goal: Ability to manage health-related needs will improve Outcome: Progressing   Problem: Clinical Measurements: Goal: Ability to maintain clinical measurements within normal limits will improve Outcome: Progressing Goal: Will remain free from infection Outcome: Progressing Goal: Diagnostic test results will improve Outcome: Progressing Goal: Respiratory complications will improve Outcome: Progressing Goal: Cardiovascular complication will be avoided Outcome: Progressing   Problem: Activity: Goal: Risk for activity intolerance will decrease Outcome: Progressing   Problem: Nutrition: Goal: Adequate nutrition will be maintained Outcome: Progressing   Problem: Coping: Goal: Level of anxiety will decrease Outcome: Progressing   Problem: Elimination: Goal: Will not experience complications related to bowel motility Outcome: Progressing Goal: Will not experience complications related to urinary retention Outcome: Progressing   Problem: Safety: Goal: Ability to remain free from injury will improve Outcome: Progressing   Problem: Skin Integrity: Goal: Risk for impaired skin integrity will decrease Outcome: Progressing   Problem: Education: Goal: Ability to describe self-care measures that may prevent or decrease complications (Diabetes Survival Skills Education) will improve Outcome: Progressing Goal: Individualized Educational Video(s) Outcome: Progressing   Problem: Coping: Goal: Ability to adjust to condition or change in health will improve Outcome: Progressing   Problem: Fluid Volume: Goal: Ability to maintain a balanced intake and output will improve Outcome: Progressing   Problem: Health  Behavior/Discharge Planning: Goal: Ability to identify and utilize available resources and services will improve Outcome: Progressing Goal: Ability to manage health-related needs will improve Outcome: Progressing   Problem: Metabolic: Goal: Ability to maintain appropriate glucose levels will improve Outcome: Progressing

## 2023-06-13 DIAGNOSIS — I739 Peripheral vascular disease, unspecified: Secondary | ICD-10-CM | POA: Diagnosis not present

## 2023-06-13 DIAGNOSIS — E782 Mixed hyperlipidemia: Secondary | ICD-10-CM | POA: Diagnosis not present

## 2023-06-13 DIAGNOSIS — R634 Abnormal weight loss: Secondary | ICD-10-CM | POA: Diagnosis not present

## 2023-06-13 DIAGNOSIS — E871 Hypo-osmolality and hyponatremia: Secondary | ICD-10-CM | POA: Diagnosis not present

## 2023-06-13 LAB — GLUCOSE, CAPILLARY
Glucose-Capillary: 113 mg/dL — ABNORMAL HIGH (ref 70–99)
Glucose-Capillary: 173 mg/dL — ABNORMAL HIGH (ref 70–99)
Glucose-Capillary: 182 mg/dL — ABNORMAL HIGH (ref 70–99)
Glucose-Capillary: 100 mg/dL — ABNORMAL HIGH (ref 70–99)
Glucose-Capillary: 194 mg/dL — ABNORMAL HIGH (ref 70–99)

## 2023-06-13 LAB — SODIUM: Sodium: 123 mmol/L — ABNORMAL LOW (ref 135–145)

## 2023-06-13 LAB — BASIC METABOLIC PANEL
Anion gap: 6 (ref 5–15)
BUN: 17 mg/dL (ref 8–23)
CO2: 23 mmol/L (ref 22–32)
Calcium: 8.5 mg/dL — ABNORMAL LOW (ref 8.9–10.3)
Chloride: 94 mmol/L — ABNORMAL LOW (ref 98–111)
Creatinine, Ser: 1.18 mg/dL (ref 0.61–1.24)
GFR, Estimated: >60 mL/min (ref >60)
Glucose, Bld: 121 mg/dL — ABNORMAL HIGH (ref 70–99)
Potassium: 4.7 mmol/L (ref 3.5–5.1)
Sodium: 123 mmol/L — ABNORMAL LOW (ref 135–145)

## 2023-06-13 LAB — CBC
HCT: 32.5 % — ABNORMAL LOW (ref 39.0–52.0)
Hemoglobin: 11 g/dL — ABNORMAL LOW (ref 13.0–17.0)
MCH: 31.5 pg (ref 26.0–34.0)
MCHC: 33.8 g/dL (ref 30.0–36.0)
MCV: 93.1 fL (ref 80.0–100.0)
Platelets: 195 10*3/uL (ref 150–400)
RBC: 3.49 MIL/uL — ABNORMAL LOW (ref 4.22–5.81)
RDW: 13.4 % (ref 11.5–15.5)
WBC: 6.8 10*3/uL (ref 4.0–10.5)
nRBC: 0 % (ref 0.0–0.2)

## 2023-06-13 MED ORDER — ALUM & MAG HYDROXIDE-SIMETH 200-200-20 MG/5ML PO SUSP
30.0000 mL | ORAL | Status: DC | PRN
  Administered 2023-06-13: 30 mL via ORAL
  Filled 2023-06-13: qty 30

## 2023-06-13 MED ORDER — SODIUM CHLORIDE 1 G PO TABS
1.0000 g | ORAL_TABLET | Freq: Three times a day (TID) | ORAL | Status: DC
  Administered 2023-06-13 – 2023-06-14 (×4): 1 g via ORAL
  Filled 2023-06-13 (×4): qty 1

## 2023-06-13 MED ORDER — MIDODRINE HCL 5 MG PO TABS
5.0000 mg | ORAL_TABLET | Freq: Three times a day (TID) | ORAL | Status: DC
  Administered 2023-06-13: 5 mg via ORAL
  Filled 2023-06-13: qty 1

## 2023-06-13 MED ORDER — LISINOPRIL 10 MG PO TABS
10.0000 mg | ORAL_TABLET | Freq: Every day | ORAL | Status: DC
  Administered 2023-06-13 – 2023-06-14 (×2): 10 mg via ORAL
  Filled 2023-06-13 (×2): qty 1

## 2023-06-13 MED ORDER — AMLODIPINE BESYLATE 5 MG PO TABS
10.0000 mg | ORAL_TABLET | Freq: Every day | ORAL | Status: DC
  Administered 2023-06-13 – 2023-06-14 (×2): 10 mg via ORAL
  Filled 2023-06-13 (×2): qty 2

## 2023-06-13 NOTE — Progress Notes (Signed)
PROGRESS NOTE   Chase Briggs  AYT:[id number] DOB: [birth year] DOA: 06/09/2023 PCP: Anabel Halon, MD   Chief Complaint  Patient presents with   Abnormal Labs   Level of care: Stepdown  Brief Admission History:  72 year old male longtime active smoker with peripheral vascular disease status post bilateral external iliac stents, diabetes mellitus, GERD, hypertension, hyperlipidemia, chronic kidney disease stage IIIb, fatty liver, GERD, diverticulosis was sent by his primary care provider with concern for abnormal labs.  He was taken off of hydrochlorothiazide yesterday after learning his sodium was down to 117.  Patient says that he has been taking hydrochlorothiazide for many years but has not been having his labs checked regularly.  Patient reports that he has been feeling unwell for the past several months.  He has lost about 20 pounds over the last 2 months.  He has very poor appetite.  He has fatigue and malaise.  He has been having testicular pain and has been having nocturia.  He reports a weak urinary stream.  He saw urology earlier this month.  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  In ED patient noted to have sodium of 119.  CT abdomen demonstrated a rim-enhancing lesion involving the right prostate gland that was very suspicious concerning for malignancy versus abscess.  Urology was consulted.  Patient is being admitted for further management of severe hyponatremia and new findings of prostatic abnormality concerning for malignancy.   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 (prostate?) - stopping frequent sodium testing per pt  request, daily BMPs ordered - appreciate nephrology consultation, starting on salt tabs, hold IVF, lasix ordered to remove free water but currently on hold due to hypotension and AKI - Na remains at 123   AKI - resolved by holding furosemide - pt a little dehydrated from poor oral intake and lasix - hold lasix (discussed it with nephrology) - pt had small fluid bolus on 9/28 - encouraged oral intake - recheck labs in AM    Essential hypertension  - hydrochlorothiazide has been discontinued  - developed hypotension after IV lasix, started on midodrine temporarily - BPs have rebounded, DC midodrine - monitor for clonidine rebound HTN   Prostate mass/lesion  - highly suspicious for malignancy vs abscess  - PSA WNL - urology consultation reportedly planning OP biopsy - discussed CT findings with patient and family at bedside   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 - follow up A1c - 6.3%  - resumed home basal insulin  - monitor CBG carefully, 5x per day - continue prandial coverage  CBG (last 3)  Recent Labs    06/12/23 1950 06/13/23 0239 06/13/23 0754  GLUCAP 175* 113* 173*   PVD s/p iliac stent placement - temporarily held plavix until urology can see him in case he needs procedures but reports he will have OP biopsy, resumed plavix    AKI on Stage 3b CKD - follow renal function with hydration - renally dose medication as needed   Tobacco use with COPD  - nicotine patch was ordered;  - counseled patient to stop all tobacco use especially given new CT findings - nursing to provide further tobacco cessation  information - bronchodilators ordered as needed    Abnormal weight loss - reports unintentional 20 pound weight loss - prostate cancer work up per urology  - follow up PSA - nutritional supplements ordered  DVT prophylaxis: SQ heparin  Code Status: Full  Family Communication: bedside update 9/26, 9/27, 9/28, 9/29  Disposition:  TBD    Consultants:  Nephrology urology Procedures:   Antimicrobials:    Subjective: Pt requests to reduce the frequent blood draws if possible.     Objective: Vitals:   06/13/23 0600 06/13/23 0630 06/13/23 0700 06/13/23 0756  BP: (!) 96/51 109/77 (!) 143/71   Pulse: 76 97 72   Resp: 19 (!) 22 16   Temp:    97.9 F (36.6 C)  TempSrc:    Oral  SpO2: 98% 99% 96%   Weight:      Height:        Intake/Output Summary (Last 24 hours) at 06/13/2023 0946 Last data filed at 06/13/2023 0809 Gross per 24 hour  Intake 240 ml  Output 850 ml  Net -610 ml   Filed Weights   06/09/23 0855  Weight: 56.2 kg   Examination:  General exam: cachectic, Appears calm and comfortable  Respiratory system: Clear to auscultation. Respiratory effort normal. Cardiovascular system: normal S1 & S2 heard. No JVD, murmurs, rubs, gallops or clicks. No pedal edema. Gastrointestinal system: Abdomen is nondistended, soft and nontender. No organomegaly or masses felt. Normal bowel sounds heard. Central nervous system: Alert and oriented. No focal neurological deficits. Extremities: Symmetric 5 x 5 power. Skin: No rashes, lesions or ulcers. Psychiatry: Judgement and insight appear normal. Mood & affect flat.   Data Reviewed: I have personally reviewed following labs and imaging studies  CBC: Recent Labs  Lab 06/08/23 1132 06/09/23 0854 06/10/23 0145 06/11/23 0359 06/12/23 0402 06/13/23 0632  WBC 6.4 4.6 4.0 4.4 7.5 6.8  NEUTROABS 4.4 3.3  --   --   --   --   HGB 11.8* 12.1* 11.1* 11.3* 11.1* 11.0*  HCT 36.3* 35.8* 31.9* 33.1* 33.6* 32.5*  MCV 99* 93.5 92.2 92.2 93.9 93.1  PLT 241 207 179 187 201 195    Basic Metabolic Panel: Recent Labs  Lab 06/10/23 0145 06/10/23 0703 06/10/23 1633 06/10/23 1951 06/11/23 0359 06/11/23 0758 06/12/23 0402 06/12/23 0807 06/12/23 1156 06/12/23 1557 06/12/23 2042 06/13/23 0100 06/13/23 0632  NA 120*  121*   < > 121*   < > 121*   < > 123*   < > 123*  125* 123* 123* 123*  K 4.4  --  4.4  --  4.2  --  4.5  --   --   --   --   --  4.7  CL 92*  --  90*  --  93*  --  92*  --   --   --   --   --  94*  CO2 20*  --  22  --  21*  --  24  --   --   --   --   --  23  GLUCOSE 120*  --  174*  --  113*  --  178*  --   --   --   --   --  121*  BUN 13  --  13  --  11  --  21  --   --   --   --   --  17  CREATININE 1.37*  --  1.39*  --  1.06  --  1.51*  --   --   --   --   --  1.18  CALCIUM 8.2*  --  8.5*  --  8.4*  --  8.5*  --   --   --   --   --  8.5*  PHOS  --   --  2.6  --   --   --   --   --   --   --   --   --   --    < > = values in this interval not displayed.    CBG: Recent Labs  Lab 06/12/23 1556 06/12/23 1833 06/12/23 1950 06/13/23 0239 06/13/23 0754  GLUCAP 73 150* 175* 113* 173*    Recent Results (from the past 240 hour(s))  MRSA Next Gen by PCR, Nasal     Status: None   Collection Time: 06/09/23  2:17 PM   Specimen: Nasal Mucosa; Nasal Swab  Result Value Ref Range Status   MRSA by PCR Next Gen NOT DETECTED NOT DETECTED Final    Comment: (NOTE) The GeneXpert MRSA Assay (FDA approved for NASAL specimens only), is one component of a comprehensive MRSA colonization surveillance program. It is not intended to diagnose MRSA infection nor to guide or monitor treatment for MRSA infections. Test performance is not FDA approved in patients less than 21 years old. Performed at [facility], [address], [place], [place] [address]      Radiology Studies: DG CHEST PORT 1 VIEW  Result Date: 06/11/2023 CLINICAL DATA:  Altered mental status EXAM: PORTABLE CHEST 1 VIEW COMPARISON:  01/18/2023; 12/25/2022 FINDINGS: Unchanged cardiac silhouette and mediastinal contours. No focal parenchymal opacities. No pleural effusion or pneumothorax. No evidence of edema. No acute osseous abnormalities. Stigmata of dish within the thoracic spine is suspected though incompletely evaluated. IMPRESSION: No acute cardiopulmonary disease. Electronically  Signed   By: Simonne Come M.D.   On: 06/11/2023 16:28   US Venous Img Lower Bilateral (DVT)  Result Date: 06/11/2023 CLINICAL DATA:  Shortness of breath. Bilateral lower extremity pain. Evaluate for DVT. EXAM: BILATERAL LOWER EXTREMITY VENOUS DOPPLER ULTRASOUND TECHNIQUE: Gray-scale sonography with graded compression, as well as color Doppler and duplex ultrasound were performed to evaluate the lower extremity deep venous systems from the level of the common femoral vein and including the common femoral, femoral, profunda femoral, popliteal and calf veins including the posterior tibial, peroneal and gastrocnemius veins when visible. The superficial great saphenous vein was also interrogated. Spectral Doppler was utilized to evaluate flow at rest and with distal augmentation maneuvers in the common femoral, femoral and popliteal veins. COMPARISON:  Bilateral lower extremity venous Doppler ultrasound-10/15/2022 (negative) FINDINGS: RIGHT LOWER EXTREMITY Common Femoral Vein: No evidence of thrombus. Normal compressibility, respiratory phasicity and response to augmentation. Saphenofemoral Junction: No evidence of thrombus. Normal compressibility and flow on color Doppler imaging. Profunda Femoral Vein: No evidence of thrombus. Normal compressibility and flow on color Doppler imaging. Femoral Vein: No evidence of thrombus. Normal compressibility, respiratory phasicity and response to augmentation. Popliteal Vein: No evidence of thrombus. Normal compressibility, respiratory phasicity and response to augmentation. Calf Veins: No evidence of thrombus. Normal compressibility and flow on color Doppler imaging. Superficial Great Saphenous Vein: No evidence of thrombus. Normal compressibility. Other Findings:  None. LEFT LOWER EXTREMITY Common Femoral Vein: No evidence of thrombus. Normal compressibility, respiratory phasicity and response to augmentation. Saphenofemoral Junction: No evidence of thrombus. Normal  compressibility and flow on color Doppler imaging. Profunda Femoral  Vein: No evidence of thrombus. Normal compressibility and flow on color Doppler imaging. Femoral Vein: No evidence of thrombus. Normal compressibility, respiratory phasicity and response to augmentation. Popliteal Vein: No evidence of thrombus. Normal compressibility, respiratory phasicity and response to augmentation. Calf Veins: No evidence of thrombus. Normal compressibility and flow on color Doppler imaging. Superficial Great Saphenous Vein: No evidence of thrombus. Normal compressibility. Other Findings:  None. IMPRESSION: No evidence of DVT within either lower extremity. Electronically Signed   By: Simonne Come M.D.   On: 06/11/2023 16:27    Scheduled Meds:  alfuzosin  10 mg Oral QHS   aspirin EC  81 mg Oral Q breakfast   atorvastatin  10 mg Oral QPM   Chlorhexidine Gluconate Cloth  6 each Topical Q0600   clopidogrel  75 mg Oral Daily   ferrous sulfate  325 mg Oral Q breakfast   heparin  5,000 Units Subcutaneous Q8H   insulin aspart  0-9 Units Subcutaneous TID WC   insulin aspart  3 Units Subcutaneous TID WC   insulin glargine-yfgn  10 Units Subcutaneous Daily   nicotine  21 mg Transdermal Daily   pantoprazole  40 mg Oral Q supper   sodium chloride  1 g Oral TID WC   traZODone  50 mg Oral QHS   Continuous Infusions:   LOS: 4 days   Time spent: 50 mins  Quinlan Mcfall Laural Benes, MD How to contact the [facility] Attending or Consulting provider 7A - 7P or covering provider during after hours 7P -7A, for this patient?  Check the care team in [facility] and look for a) attending/consulting [facility] provider listed and b) the [facility] team listed Log into [email or web address] and use [facility] universal password to access. If you do not have the password, please contact the hospital operator. Locate the [facility] provider you are looking for under [facility] and page to a number that you can be directly reached. If you still have difficulty reaching the  provider, please page the [facility] (Director on Call) for the Hospitalists listed on amion for assistance.  06/13/2023, 9:46 AM

## 2023-06-14 DIAGNOSIS — R634 Abnormal weight loss: Secondary | ICD-10-CM | POA: Diagnosis not present

## 2023-06-14 DIAGNOSIS — N1832 Chronic kidney disease, stage 3b: Secondary | ICD-10-CM | POA: Diagnosis not present

## 2023-06-14 DIAGNOSIS — E871 Hypo-osmolality and hyponatremia: Secondary | ICD-10-CM | POA: Diagnosis not present

## 2023-06-14 DIAGNOSIS — Z794 Long term (current) use of insulin: Secondary | ICD-10-CM

## 2023-06-14 DIAGNOSIS — N4289 Other specified disorders of prostate: Secondary | ICD-10-CM | POA: Diagnosis not present

## 2023-06-14 LAB — BASIC METABOLIC PANEL
Anion gap: 11 (ref 5–15)
Anion gap: 10 (ref 5–15)
BUN: 13 mg/dL (ref 8–23)
BUN: 14 mg/dL (ref 8–23)
CO2: 20 mmol/L — ABNORMAL LOW (ref 22–32)
CO2: 23 mmol/L (ref 22–32)
Calcium: 8.5 mg/dL — ABNORMAL LOW (ref 8.9–10.3)
Calcium: 9.3 mg/dL (ref 8.9–10.3)
Chloride: 95 mmol/L — ABNORMAL LOW (ref 98–111)
Chloride: 96 mmol/L — ABNORMAL LOW (ref 98–111)
Creatinine, Ser: 1.1 mg/dL (ref 0.61–1.24)
Creatinine, Ser: 1.19 mg/dL (ref 0.61–1.24)
GFR, Estimated: >60 mL/min (ref >60)
GFR, Estimated: >60 mL/min (ref >60)
Glucose, Bld: 107 mg/dL — ABNORMAL HIGH (ref 70–99)
Glucose, Bld: 98 mg/dL (ref 70–99)
Potassium: 4.2 mmol/L (ref 3.5–5.1)
Potassium: 5.1 mmol/L (ref 3.5–5.1)
Sodium: 126 mmol/L — ABNORMAL LOW (ref 135–145)
Sodium: 129 mmol/L — ABNORMAL LOW (ref 135–145)

## 2023-06-14 LAB — GLUCOSE, CAPILLARY
Glucose-Capillary: 122 mg/dL — ABNORMAL HIGH (ref 70–99)
Glucose-Capillary: 140 mg/dL — ABNORMAL HIGH (ref 70–99)
Glucose-Capillary: 220 mg/dL — ABNORMAL HIGH (ref 70–99)
Glucose-Capillary: 94 mg/dL (ref 70–99)

## 2023-06-14 LAB — CBC
HCT: 30.3 % — ABNORMAL LOW (ref 39.0–52.0)
Hemoglobin: 10.5 g/dL — ABNORMAL LOW (ref 13.0–17.0)
MCH: 32.1 pg (ref 26.0–34.0)
MCHC: 34.7 g/dL (ref 30.0–36.0)
MCV: 92.7 fL (ref 80.0–100.0)
Platelets: 208 10*3/uL (ref 150–400)
RBC: 3.27 MIL/uL — ABNORMAL LOW (ref 4.22–5.81)
RDW: 13.2 % (ref 11.5–15.5)
WBC: 6.6 10*3/uL (ref 4.0–10.5)
nRBC: 0 % (ref 0.0–0.2)

## 2023-06-14 MED ORDER — LISINOPRIL 20 MG PO TABS: 20.0000 mg | ORAL_TABLET | Freq: Every day | ORAL | 1 refills | Status: DC

## 2023-06-14 MED ORDER — TRAZODONE HCL 100 MG PO TABS: 100.0000 mg | ORAL_TABLET | Freq: Every day | ORAL | Status: DC

## 2023-06-14 MED ORDER — LANTUS SOLOSTAR 100 UNIT/ML ~~LOC~~ SOPN: 10.0000 [IU] | PEN_INJECTOR | Freq: Every day | SUBCUTANEOUS | Status: DC

## 2023-06-14 NOTE — Progress Notes (Signed)
Mobility Specialist Progress Note:    06/14/23 1010  Mobility  Activity Ambulated with assistance in hallway  Level of Assistance Contact guard assist, steadying assist  Assistive Device None  Distance Ambulated (ft) 175 ft  Range of Motion/Exercises Active;All extremities  Activity Response Tolerated well  Mobility Referral Yes  $Mobility charge 1 Mobility  Mobility Specialist Start Time (ACUTE ONLY) 1010  Mobility Specialist Stop Time (ACUTE ONLY) 1020  Mobility Specialist Time Calculation (min) (ACUTE ONLY) 10 min   Pt received in bed, agreeable to mobility. Required CGA to stand and ambulate with no AD. Tolerated well, pt c/o fatigue at EOS. Returned pt to room, all needs met.   Lawerance Bach Mobility Specialist Please contact via Special educational needs teacher or  Rehab office at [phone]

## 2023-06-14 NOTE — Discharge Instructions (Addendum)
PLEASE HAVE YOUR SODIUM CHECKED WITH YOUR PRIMARY CARE PROVIDER IN 1 WEEK.   PLEASE FOLLOW UP WITH DR Ronne Binning FOR PROSTATE BIOPSY ON 07/16/23 AS SCHEDULED    IMPORTANT INFORMATION: PAY CLOSE ATTENTION   PHYSICIAN DISCHARGE INSTRUCTIONS  Follow with Primary care provider  Anabel Halon, MD  and other consultants as instructed by your Hospitalist Physician  SEEK MEDICAL CARE OR RETURN TO EMERGENCY ROOM IF SYMPTOMS COME BACK, WORSEN OR NEW PROBLEM DEVELOPS   Please note: You were cared for by a hospitalist during your hospital stay. Every effort will be made to forward records to your primary care provider.  You can request that your primary care provider send for your hospital records if they have not received them.  Once you are discharged, your primary care physician will handle any further medical issues. Please note that NO REFILLS for any discharge medications will be authorized once you are discharged, as it is imperative that you return to your primary care physician (or establish a relationship with a primary care physician if you do not have one) for your post hospital discharge needs so that they can reassess your need for medications and monitor your lab values.  Please get a complete blood count and chemistry panel checked by your Primary MD at your next visit, and again as instructed by your Primary MD.  Get Medicines reviewed and adjusted: Please take all your medications with you for your next visit with your Primary MD  Laboratory/radiological data: Please request your Primary MD to go over all hospital tests and procedure/radiological results at the follow up, please ask your primary care provider to get all Hospital records sent to his/her office.  In some cases, they will be blood work, cultures and biopsy results pending at the time of your discharge. Please request that your primary care provider follow up on these results.  If you are diabetic, please bring your blood  sugar readings with you to your follow up appointment with primary care.    Please call and make your follow up appointments as soon as possible.    Also Note the following: If you experience worsening of your admission symptoms, develop shortness of breath, life threatening emergency, suicidal or homicidal thoughts you must seek medical attention immediately by calling 911 or calling your MD immediately  if symptoms less severe.  You must read complete instructions/literature along with all the possible adverse reactions/side effects for all the Medicines you take and that have been prescribed to you. Take any new Medicines after you have completely understood and accpet all the possible adverse reactions/side effects.   Do not drive when taking Pain medications or sleeping medications (Benzodiazepines)  Do not take more than prescribed Pain, Sleep and Anxiety Medications. It is not advisable to combine anxiety,sleep and pain medications without talking with your primary care practitioner  Special Instructions: If you have smoked or chewed Tobacco  in the last 2 yrs please stop smoking, stop any regular Alcohol  and or any Recreational drug use.  Wear Seat belts while driving.  Do not drive if taking any narcotic, mind altering or controlled substances or recreational drugs or alcohol.

## 2023-06-14 NOTE — Discharge Summary (Signed)
Physician Discharge Summary  Chase Briggs JOA:[id number] DOB: [birth year] DOA: 06/09/2023  PCP: Anabel Halon, MD  Admit date: 06/09/2023 Discharge date: 06/14/2023  Admitted From:  Home  Disposition:  Home   Recommendations for Outpatient Follow-up:  Follow up with PCP in 1 weeks to recheck sodium level Please obtain BMP in one week  Discharge Condition: STABLE   CODE STATUS: FULL DIET: resume heart healthy   Brief Hospitalization Summary: Please see all hospital notes, images, labs for full details of the hospitalization. Admission provider HPI:  72 year old male longtime active smoker with peripheral vascular disease status post bilateral external iliac stents, diabetes mellitus, GERD, hypertension, hyperlipidemia, chronic kidney disease stage IIIb, fatty liver, GERD, diverticulosis was sent by his primary care provider with concern for abnormal labs.  He was taken off of hydrochlorothiazide yesterday after learning his sodium was down to 117.  Patient says that he has been taking hydrochlorothiazide for many years but has not been having his labs checked regularly.  Patient reports that he has been feeling unwell for the past several months.  He has lost about 20 pounds over the last 2 months.  He has very poor appetite.  He has fatigue and malaise.  He has been having testicular pain and has been having nocturia.  He reports a weak urinary stream.  He saw urology earlier this month.  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  In ED patient noted to have sodium of 119.  CT abdomen demonstrated a rim-enhancing lesion involving the right prostate gland that was very suspicious concerning for malignancy versus abscess.  Urology was consulted.  Patient is being admitted for further management of severe hyponatremia and new findings of prostatic abnormality concerning  for malignancy.  Hospital Course by Problem List  Severe Hyponatremia - Pt was symptomatic and presented with Na of 119, initially improved to 123 and held at 123 for a while but finally improved to 129  - pt remained asymptomatic - suspect cause secondary to hydrochlorothiazide and possible SIADH of malignancy (prostate?) - appreciate nephrology consultation - Na has improved to 129 after recheck, discussed with patient he feels like he wants to go home today and not stay another night and will follow up with his PCP to have BMP checked in 1 week per nephrology recommendations.  Pt has been ambulating in halls and eating better.    AKI - resolved by holding furosemide - pt a little dehydrated from poor oral intake and lasix - hold lasix (discussed it with nephrology) - pt had small fluid bolus on 9/28 - encouraged oral intake   Essential hypertension  - hydrochlorothiazide has been discontinued  - developed hypotension after IV lasix, started on midodrine temporarily - BPs have rebounded, DC midodrine - restarted lisinopril 20 mg, clonidine 0.1 mg BID, amlodipine 10 mg    Prostate mass/lesion  - highly suspicious for malignancy vs abscess  - PSA WNL - urology consultation reportedly planning OP biopsy - discussed CT findings with patient and family at bedside - pt has appt with Dr. 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 - follow up A1c - 6.3%  - resumed home basal insulin  - monitor CBG carefully, 5x per day - continue prandial coverage  CBG (last 3)  Recent Labs (last 2 labs)       Recent  Labs    06/12/23 1950 06/13/23 0239 06/13/23 0754  GLUCAP 175* 113* 173*      PVD s/p iliac stent placement - temporarily held plavix until urology can see him in case he needs procedures but reports he will have OP biopsy, resumed plavix    AKI on Stage 3b CKD - follow renal function with hydration - renally dose medication  as needed   Tobacco use with COPD  - nicotine patch was ordered;  - counseled patient to stop all tobacco use especially given new CT findings - nursing to provide further tobacco cessation information - bronchodilators ordered as needed    Abnormal weight loss - reports unintentional 20 pound weight loss - prostate cancer work up per urology  - follow up PSA - nutritional supplements ordered   DVT prophylaxis: SQ heparin  Code Status: Full  Family Communication: bedside update 9/26, 9/27, 9/28, 9/29, 9/30  Disposition: home     Consultants:  Nephrology urology   Discharge Diagnoses:  Principal Problem:   Hyponatremia Active Problems:   Essential hypertension   Fatty liver   Type 2 diabetes mellitus with other specified complication (HCC)   Mixed hyperlipidemia   Current smoker   PAD (peripheral artery disease) (HCC)   Centrilobular emphysema (HCC)   Insulin long-term use (HCC)   Prostate mass   Abnormal weight loss   Poor appetite   Stage 3b chronic kidney disease (CKD) (HCC)   Hyperglycemia   Normocytic anemia   Vasovagal episode   Discharge Instructions:  Allergies as of 06/14/2023   No Known Allergies      Medication List     STOP taking these medications    sulfamethoxazole-trimethoprim 800-160 MG tablet Commonly known as: BACTRIM DS       TAKE these medications    albuterol 108 (90 Base) MCG/ACT inhaler Commonly known as: VENTOLIN HFA Inhale 2 puffs into the lungs every 6 (six) hours as needed for wheezing or shortness of breath.   amLODipine 10 MG tablet Commonly known as: NORVASC Take 1 tablet (10 mg total) by mouth daily.   aspirin EC 81 MG tablet Take 1 tablet (81 mg total) by mouth daily with breakfast.   atorvastatin 10 MG tablet Commonly known as: LIPITOR Take 1 tablet (10 mg total) by mouth daily.   cetirizine 10 MG tablet Commonly known as: ZYRTEC Take 1 tablet (10 mg total) by mouth daily.   cloNIDine 0.1 MG  tablet Commonly known as: CATAPRES Take 1 tablet (0.1 mg total) by mouth 2 (two) times daily.   clopidogrel 75 MG tablet Commonly known as: PLAVIX TAKE (1) TABLET BY MOUTH ONCE DAILY.   EPINEPHrine 0.3 mg/0.3 mL Soaj injection Commonly known as: EPI-PEN Inject 0.3 mg into the muscle as needed for anaphylaxis.   ferrous sulfate 325 (65 FE) MG EC tablet Take 325 mg by mouth daily with breakfast.   glucose blood test strip 1 each by Other route 2 (two) times daily. Use as instructed bid E11.65 Relion Premier   Lancets Misc 1 each by Does not apply route 2 (two) times daily. E11.65 Relion Premier   Lantus SoloStar 100 UNIT/ML Solostar Pen Generic drug: insulin glargine Inject 10 Units into the skin daily. What changed: See the new instructions.   lisinopril 20 MG tablet Commonly known as: ZESTRIL Take 1 tablet (20 mg total) by mouth daily. What changed:  medication strength how much to take   mupirocin ointment 2 % Commonly known as: BACTROBAN Apply 1  Application topically 2 (two) times daily. Over upper lip area.   nystatin 100000 UNIT/ML suspension Commonly known as: MYCOSTATIN Take 5 mLs (500,000 Units total) by mouth 4 (four) times daily.   Restasis 0.05 % ophthalmic emulsion Generic drug: cycloSPORINE Place 1 drop into both eyes 2 (two) times daily as needed.   Sure Comfort Pen Needles 32G X 4 MM Misc Generic drug: Insulin Pen Needle USE ONCE DAILY   traMADol 50 MG tablet Commonly known as: ULTRAM Take 50 mg by mouth at bedtime.   traZODone 100 MG tablet Commonly known as: DESYREL Take 1 tablet (100 mg total) by mouth at bedtime.   Vitamin D 125 MCG (5000 UT) Caps Take 2,000 Units by mouth daily.        Follow-up Information     Anabel Halon, MD. Schedule an appointment as soon as possible for a visit in 1 week(s).   Specialty: Internal Medicine Why: Hospital Follow Up to check labs Contact information: [address] [place]  [address] [phone]         Malen Gauze, MD Follow up on 07/16/2023.   Specialty: Urology Why: Hospital Follow Up for prostate biopsy Contact information: [address] [place] [address] [phone]                No Known Allergies Allergies as of 06/14/2023   No Known Allergies      Medication List     STOP taking these medications    sulfamethoxazole-trimethoprim 800-160 MG tablet Commonly known as: BACTRIM DS       TAKE these medications    albuterol 108 (90 Base) MCG/ACT inhaler Commonly known as: VENTOLIN HFA Inhale 2 puffs into the lungs every 6 (six) hours as needed for wheezing or shortness of breath.   amLODipine 10 MG tablet Commonly known as: NORVASC Take 1 tablet (10 mg total) by mouth daily.   aspirin EC 81 MG tablet Take 1 tablet (81 mg total) by mouth daily with breakfast.   atorvastatin 10 MG tablet Commonly known as: LIPITOR Take 1 tablet (10 mg total) by mouth daily.   cetirizine 10 MG tablet Commonly known as: ZYRTEC Take 1 tablet (10 mg total) by mouth daily.   cloNIDine 0.1 MG tablet Commonly known as: CATAPRES Take 1 tablet (0.1 mg total) by mouth 2 (two) times daily.   clopidogrel 75 MG tablet Commonly known as: PLAVIX TAKE (1) TABLET BY MOUTH ONCE DAILY.   EPINEPHrine 0.3 mg/0.3 mL Soaj injection Commonly known as: EPI-PEN Inject 0.3 mg into the muscle as needed for anaphylaxis.   ferrous sulfate 325 (65 FE) MG EC tablet Take 325 mg by mouth daily with breakfast.   glucose blood test strip 1 each by Other route 2 (two) times daily. Use as instructed bid E11.65 Relion Premier   Lancets Misc 1 each by Does not apply route 2 (two) times daily. E11.65 Relion Premier   Lantus SoloStar 100 UNIT/ML Solostar Pen Generic drug: insulin glargine Inject 10 Units into the skin daily. What changed: See the new instructions.   lisinopril 20 MG tablet Commonly known as: ZESTRIL Take 1 tablet (20 mg  total) by mouth daily. What changed:  medication strength how much to take   mupirocin ointment 2 % Commonly known as: BACTROBAN Apply 1 Application topically 2 (two) times daily. Over upper lip area.   nystatin 100000 UNIT/ML suspension Commonly known as: MYCOSTATIN Take 5 mLs (500,000 Units total) by mouth  4 (four) times daily.   Restasis 0.05 % ophthalmic emulsion Generic drug: cycloSPORINE Place 1 drop into both eyes 2 (two) times daily as needed.   Sure Comfort Pen Needles 32G X 4 MM Misc Generic drug: Insulin Pen Needle USE ONCE DAILY   traMADol 50 MG tablet Commonly known as: ULTRAM Take 50 mg by mouth at bedtime.   traZODone 100 MG tablet Commonly known as: DESYREL Take 1 tablet (100 mg total) by mouth at bedtime.   Vitamin D 125 MCG (5000 UT) Caps Take 2,000 Units by mouth daily.        Procedures/Studies: DG CHEST PORT 1 VIEW  Result Date: 06/11/2023 CLINICAL DATA:  Altered mental status EXAM: PORTABLE CHEST 1 VIEW COMPARISON:  01/18/2023; 12/25/2022 FINDINGS: Unchanged cardiac silhouette and mediastinal contours. No focal parenchymal opacities. No pleural effusion or pneumothorax. No evidence of edema. No acute osseous abnormalities. Stigmata of dish within the thoracic spine is suspected though incompletely evaluated. IMPRESSION: No acute cardiopulmonary disease. Electronically Signed   By: Simonne Come M.D.   On: 06/11/2023 16:28   US Venous Img Lower Bilateral (DVT)  Result Date: 06/11/2023 CLINICAL DATA:  Shortness of breath. Bilateral lower extremity pain. Evaluate for DVT. EXAM: BILATERAL LOWER EXTREMITY VENOUS DOPPLER ULTRASOUND TECHNIQUE: Gray-scale sonography with graded compression, as well as color Doppler and duplex ultrasound were performed to evaluate the lower extremity deep venous systems from the level of the common femoral vein and including the common femoral, femoral, profunda femoral, popliteal and calf veins including the posterior tibial,  peroneal and gastrocnemius veins when visible. The superficial great saphenous vein was also interrogated. Spectral Doppler was utilized to evaluate flow at rest and with distal augmentation maneuvers in the common femoral, femoral and popliteal veins. COMPARISON:  Bilateral lower extremity venous Doppler ultrasound-10/15/2022 (negative) FINDINGS: RIGHT LOWER EXTREMITY Common Femoral Vein: No evidence of thrombus. Normal compressibility, respiratory phasicity and response to augmentation. Saphenofemoral Junction: No evidence of thrombus. Normal compressibility and flow on color Doppler imaging. Profunda Femoral Vein: No evidence of thrombus. Normal compressibility and flow on color Doppler imaging. Femoral Vein: No evidence of thrombus. Normal compressibility, respiratory phasicity and response to augmentation. Popliteal Vein: No evidence of thrombus. Normal compressibility, respiratory phasicity and response to augmentation. Calf Veins: No evidence of thrombus. Normal compressibility and flow on color Doppler imaging. Superficial Great Saphenous Vein: No evidence of thrombus. Normal compressibility. Other Findings:  None. LEFT LOWER EXTREMITY Common Femoral Vein: No evidence of thrombus. Normal compressibility, respiratory phasicity and response to augmentation. Saphenofemoral Junction: No evidence of thrombus. Normal compressibility and flow on color Doppler imaging. Profunda Femoral Vein: No evidence of thrombus. Normal compressibility and flow on color Doppler imaging. Femoral Vein: No evidence of thrombus. Normal compressibility, respiratory phasicity and response to augmentation. Popliteal Vein: No evidence of thrombus. Normal compressibility, respiratory phasicity and response to augmentation. Calf Veins: No evidence of thrombus. Normal compressibility and flow on color Doppler imaging. Superficial Great Saphenous Vein: No evidence of thrombus. Normal compressibility. Other Findings:  None. IMPRESSION: No  evidence of DVT within either lower extremity. Electronically Signed   By: Simonne Come M.D.   On: 06/11/2023 16:27   CT ABDOMEN PELVIS W CONTRAST  Result Date: 06/09/2023 CLINICAL DATA:  Nonlocalized abdominal pain and weight loss. EXAM: CT ABDOMEN AND PELVIS WITH CONTRAST TECHNIQUE: Multidetector CT imaging of the abdomen and pelvis was performed using the standard protocol following bolus administration of intravenous contrast. RADIATION DOSE REDUCTION: This exam was performed according to the departmental  dose-optimization program which includes automated exposure control, adjustment of the mA and/or kV according to patient size and/or use of iterative reconstruction technique. CONTRAST:  80mL OMNIPAQUE IOHEXOL 300 MG/ML  SOLN COMPARISON:  01/28/2023 FINDINGS: Lower chest: No acute findings. Hepatobiliary: No suspicious focal abnormality within the liver parenchyma. Small area of low attenuation in the anterior liver, adjacent to the falciform ligament, is in a characteristic location for focal fatty deposition. Gallbladder is nondistended. No intrahepatic or extrahepatic biliary dilation. Pancreas: No focal mass lesion. No dilatation of the main duct. No intraparenchymal cyst. No peripancreatic edema. Spleen: No splenomegaly. No suspicious focal mass lesion. Adrenals/Urinary Tract: No adrenal nodule or mass. Tiny cyst noted lower pole left kidney. Right kidney unremarkable. No evidence for hydroureter. The urinary bladder appears normal for the degree of distention. Stomach/Bowel: Stomach is unremarkable. No gastric wall thickening. No evidence of outlet obstruction. Duodenum is normally positioned as is the ligament of Treitz. No small bowel wall thickening. No small bowel dilatation. The terminal ileum is normal. The appendix is normal. No gross colonic mass. No colonic wall thickening. Vascular/Lymphatic: There is moderate atherosclerotic calcification of the abdominal aorta without aneurysm. Advanced  atherosclerotic disease noted in the common and external iliac arteries bilaterally with evidence of bilateral external iliac artery stent placement. There is no gastrohepatic or hepatoduodenal ligament lymphadenopathy. No retroperitoneal or mesenteric lymphadenopathy. No pelvic sidewall lymphadenopathy. Reproductive: Prostate gland is mildly enlarged and heterogeneous. There is a rim enhancing collection immediately adjacent to or potentially involving the parenchyma of the posterior and superior aspect of the right prostate gland. This obscures the right seminal vesicle and tracks posterolaterally in the right pelvic floor measuring on the order of 5.3 x 4.9 x 4.3 cm (image 62/2). This lesion is contiguous with the anterior wall of the low rectum. Other: No intraperitoneal free fluid. Musculoskeletal: No worrisome lytic or sclerotic osseous abnormality. IMPRESSION: 1. 5.3 x 4.9 x 4.3 cm rim enhancing collection immediately adjacent to or potentially involving the parenchyma of the posterior and superior aspect of the right prostate gland. This obscures the right seminal vesicle and tracks posterolaterally in the right pelvic floor. This lesion is contiguous with the anterior wall of the low rectum. Imaging features could be compatible with an abscess, potentially arising from the posterior prostate gland or right seminal vesicle. Rectal origin not excluded. Necrotic neoplasm in the central pelvis/pelvic floor would also be a consideration. 2.  Aortic Atherosclerosis (ICD10-I70.0). Electronically Signed   By: Kennith Center M.D.   On: 06/09/2023 12:00     Subjective: Pt remains asymptomatic, eager to go home, tolerating diet well. No complaints other than wanting to go home.    Discharge Exam: Vitals:   06/14/23 0447 06/14/23 1535  BP: 131/70 (!) 151/75  Pulse: 81 76  Resp: 17   Temp: 98.6 F (37 C) 98.2 F (36.8 C)  SpO2: 100% 100%   Vitals:   06/13/23 2142 06/14/23 0232 06/14/23 0447 06/14/23 1535   BP: (!) 176/71 131/75 131/70 (!) 151/75  Pulse: 85 88 81 76  Resp: 20 19 17    Temp: 98.4 F (36.9 C) 98.7 F (37.1 C) 98.6 F (37 C) 98.2 F (36.8 C)  TempSrc: Oral Oral Oral Oral  SpO2: 100% 100% 100% 100%  Weight:      Height:        General: Pt is alert, awake, not in acute distress Cardiovascular: RRR, S1/S2 +, no rubs, no gallops Respiratory: CTA bilaterally, no wheezing, no rhonchi Abdominal: Soft,  NT, ND, bowel sounds + Extremities: no edema, no cyanosis   The results of significant diagnostics from this hospitalization (including imaging, microbiology, ancillary and laboratory) are listed below for reference.     Microbiology: Recent Results (from the past 240 hour(s))  MRSA Next Gen by PCR, Nasal     Status: None   Collection Time: 06/09/23  2:17 PM   Specimen: Nasal Mucosa; Nasal Swab  Result Value Ref Range Status   MRSA by PCR Next Gen NOT DETECTED NOT DETECTED Final    Comment: (NOTE) The GeneXpert MRSA Assay (FDA approved for NASAL specimens only), is one component of a comprehensive MRSA colonization surveillance program. It is not intended to diagnose MRSA infection nor to guide or monitor treatment for MRSA infections. Test performance is not FDA approved in patients less than 87 years old. Performed at [facility], [address] [place], [place] [address]      Labs: BNP (last 3 results) No results for input(s): "BNP" in the last 8760 hours. Basic Metabolic Panel: Recent Labs  Lab 06/10/23 1633 06/10/23 1951 06/11/23 0359 06/11/23 0758 06/12/23 0402 06/12/23 0807 06/12/23 2042 06/13/23 0100 06/13/23 0632 06/14/23 0458 06/14/23 1555  NA 121*   < > 121*   < > 123*   < > 123* 123* 123* 126* 129*  K 4.4  --  4.2  --  4.5  --   --   --  4.7 4.2 5.1  CL 90*  --  93*  --  92*  --   --   --  94* 95* 96*  CO2 22  --  21*  --  24  --   --   --  23 20* 23  GLUCOSE 174*  --  113*  --  178*  --   --   --  121* 107* 98  BUN 13  --  11  --  21  --    --   --  17 13 14   CREATININE 1.39*  --  1.06  --  1.51*  --   --   --  1.18 1.10 1.19  CALCIUM 8.5*  --  8.4*  --  8.5*  --   --   --  8.5* 8.5* 9.3  PHOS 2.6  --   --   --   --   --   --   --   --   --   --    < > = values in this interval not displayed.   Liver Function Tests: Recent Labs  Lab 06/08/23 1132 06/09/23 0854 06/10/23 1633  AST 16 19  --   ALT 15 20  --   ALKPHOS 115 98  --   BILITOT 0.3 0.6  --   PROT 7.4 7.9  --   ALBUMIN 4.3 4.3 3.6   Recent Labs  Lab 06/09/23 0932  LIPASE 26   No results for input(s): "AMMONIA" in the last 168 hours. CBC: Recent Labs  Lab 06/08/23 1132 06/09/23 0854 06/09/23 0854 06/10/23 0145 06/11/23 0359 06/12/23 0402 06/13/23 0632 06/14/23 0458  WBC 6.4 4.6   < > 4.0 4.4 7.5 6.8 6.6  NEUTROABS 4.4 3.3  --   --   --   --   --   --   HGB 11.8* 12.1*   < > 11.1* 11.3* 11.1* 11.0* 10.5*  HCT 36.3* 35.8*   < > 31.9* 33.1* 33.6* 32.5* 30.3*  MCV 99* 93.5   < >  92.2 92.2 93.9 93.1 92.7  PLT 241 207   < > 179 187 201 195 208   < > = values in this interval not displayed.   Cardiac Enzymes: No results for input(s): "CKTOTAL", "CKMB", "CKMBINDEX", "TROPONINI" in the last 168 hours. BNP: Invalid input(s): "POCBNP" CBG: Recent Labs  Lab 06/13/23 2144 06/14/23 0229 06/14/23 0739 06/14/23 1108 06/14/23 1640  GLUCAP 194* 122* 140* 220* 94   D-Dimer No results for input(s): "DDIMER" in the last 72 hours. Hgb A1c No results for input(s): "HGBA1C" in the last 72 hours. Lipid Profile No results for input(s): "CHOL", "HDL", "LDLCALC", "TRIG", "CHOLHDL", "LDLDIRECT" in the last 72 hours. Thyroid function studies No results for input(s): "TSH", "T4TOTAL", "T3FREE", "THYROIDAB" in the last 72 hours.  Invalid input(s): "FREET3" Anemia work up No results for input(s): "VITAMINB12", "FOLATE", "FERRITIN", "TIBC", "IRON", "RETICCTPCT" in the last 72 hours. Urinalysis    Component Value Date/Time   COLORURINE STRAW (A) 06/09/2023  1030   APPEARANCEUR CLEAR 06/09/2023 1030   APPEARANCEUR Clear 05/19/2023 1408   LABSPEC 1.014 06/09/2023 1030   PHURINE 7.0 06/09/2023 1030   GLUCOSEU NEGATIVE 06/09/2023 1030   HGBUR NEGATIVE 06/09/2023 1030   BILIRUBINUR NEGATIVE 06/09/2023 1030   BILIRUBINUR Negative 05/19/2023 1408   KETONESUR NEGATIVE 06/09/2023 1030   PROTEINUR NEGATIVE 06/09/2023 1030   UROBILINOGEN 1.0 03/06/2022 0829   UROBILINOGEN 0.2 02/19/2014 1340   NITRITE NEGATIVE 06/09/2023 1030   LEUKOCYTESUR NEGATIVE 06/09/2023 1030   Sepsis Labs Recent Labs  Lab 06/11/23 0359 06/12/23 0402 06/13/23 0632 06/14/23 0458  WBC 4.4 7.5 6.8 6.6   Microbiology Recent Results (from the past 240 hour(s))  MRSA Next Gen by PCR, Nasal     Status: None   Collection Time: 06/09/23  2:17 PM   Specimen: Nasal Mucosa; Nasal Swab  Result Value Ref Range Status   MRSA by PCR Next Gen NOT DETECTED NOT DETECTED Final    Comment: (NOTE) The GeneXpert MRSA Assay (FDA approved for NASAL specimens only), is one component of a comprehensive MRSA colonization surveillance program. It is not intended to diagnose MRSA infection nor to guide or monitor treatment for MRSA infections. Test performance is not FDA approved in patients less than 52 years old. Performed at [facility], [address] [place], [place] [address]    Time coordinating discharge: 47 mins  SIGNED:  Standley Dakins, MD  [facility] 06/14/2023, 5:39 PM How to contact the [facility] Attending or Consulting provider 7A - 7P or covering provider during after hours 7P -7A, for this patient?  Check the care team in [facility] and look for a) attending/consulting [facility] provider listed and b) the [facility] team listed Log into [email or web address] and use [facility] universal password to access. If you do not have the password, please contact the hospital operator. Locate the [facility] provider you are looking for under [facility] and page to a number that you can be directly  reached. If you still have difficulty reaching the provider, please page the [facility] (Director on Call) for the Hospitalists listed on amion for assistance.

## 2023-06-14 NOTE — Plan of Care (Signed)

## 2023-06-14 NOTE — Progress Notes (Signed)
Admit: 06/09/2023 LOS: 5  73M hypotonic hyponatremia likely related to thiazide and/or SIADH physiology  Subjective:  Serum sodium improved to 126 No complaints, feels well, wants to go home No edema  09/29 0701 - 09/30 0700 In: 480 [P.O.:480] Out: 300 [Urine:300]  Filed Weights   06/09/23 0855  Weight: 56.2 kg    Scheduled Meds:  alfuzosin  10 mg Oral QHS   amLODipine  10 mg Oral Daily   aspirin EC  81 mg Oral Q breakfast   atorvastatin  10 mg Oral QPM   clopidogrel  75 mg Oral Daily   ferrous sulfate  325 mg Oral Q breakfast   insulin aspart  0-9 Units Subcutaneous TID WC   insulin aspart  3 Units Subcutaneous TID WC   insulin glargine-yfgn  10 Units Subcutaneous Daily   lisinopril  10 mg Oral Daily   nicotine  21 mg Transdermal Daily   pantoprazole  40 mg Oral Q supper   sodium chloride  1 g Oral TID WC   traZODone  50 mg Oral QHS   Continuous Infusions: PRN Meds:.acetaminophen **OR** acetaminophen, alum & mag hydroxide-simeth, bisacodyl, fentaNYL (SUBLIMAZE) injection, ipratropium-albuterol, ondansetron **OR** ondansetron (ZOFRAN) IV, oxyCODONE, prochlorperazine  Current Labs: reviewed    Physical Exam:  Blood pressure 131/70, pulse 81, temperature 98.6 F (37 C), temperature source Oral, resp. rate 17, height 5\' 7"  (1.702 m), weight 56.2 kg, SpO2 100%. 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, slowly improving; asymptomatic Question of prostate cancer, urology to follow as an outpatient Hypertension, off thiazide but remains on amlodipine and is on lisinopril; normotensive  P Continue 1.2 L fluid restriction, increased solute intake through protein food choices Stop salt tabs, they are largely ineffective Repeat BMP around 1500 if 130 or higher okay to discharge and  follow-up closely with PCP Daily weights, Daily Renal Panel, Strict I/Os, Avoid nephrotoxins (NSAIDs, judicious IV Contrast)    Sabra Heck MD 06/14/2023, 10:46 AM  Recent Labs  Lab 06/10/23 1633 06/10/23 1951 06/12/23 0402 06/12/23 0807 06/13/23 0100 06/13/23 0632 06/14/23 0458  NA 121*   < > 123*   < > 123* 123* 126*  K 4.4   < > 4.5  --   --  4.7 4.2  CL 90*   < > 92*  --   --  94* 95*  CO2 22   < > 24  --   --  23 20*  GLUCOSE 174*   < > 178*  --   --  121* 107*  BUN 13   < > 21  --   --  17 13  CREATININE 1.39*   < > 1.51*  --   --  1.18 1.10  CALCIUM 8.5*   < > 8.5*  --   --  8.5* 8.5*  PHOS 2.6  --   --   --   --   --   --    < > = values in this interval not displayed.   Recent Labs  Lab 06/08/23 1132 06/09/23 0854 06/10/23 0145 06/12/23 0402 06/13/23 0632 06/14/23 0458  WBC 6.4 4.6   < > 7.5 6.8 6.6  NEUTROABS 4.4 3.3  --   --   --   --   HGB 11.8* 12.1*   < > 11.1* 11.0* 10.5*  HCT 36.3* 35.8*   < > 33.6* 32.5* 30.3*  MCV 99* 93.5   < > 93.9 93.1 92.7  PLT 241 207   < > 201 195 208   < > = values in this interval not displayed.

## 2023-06-15 ENCOUNTER — Telehealth: Payer: Self-pay | Admitting: Internal Medicine

## 2023-06-15 NOTE — Telephone Encounter (Signed)
Patient scheduled 06/23/2023 at 2pm

## 2023-06-15 NOTE — Telephone Encounter (Signed)
Patient released from hosp yesterday says he was told to get follow up with Chase Briggs within the next week- there are no openings for anybody for hosp f/u within the next week. Please advise Thank you

## 2023-06-16 ENCOUNTER — Telehealth: Payer: Self-pay | Admitting: Urology

## 2023-06-16 NOTE — Telephone Encounter (Signed)
Patient wife called he was released from the hospital and was told to follow up with Dr Ronne Binning , patient is scheduled for 11/1 , does he need an earlier appointment or is it ok for him to wait until November?

## 2023-06-17 ENCOUNTER — Telehealth: Payer: Self-pay

## 2023-06-17 ENCOUNTER — Ambulatory Visit: Payer: Medicare Other | Admitting: Urology

## 2023-06-17 DIAGNOSIS — R972 Elevated prostate specific antigen [PSA]: Secondary | ICD-10-CM

## 2023-06-17 NOTE — Telephone Encounter (Signed)
Patient left a voice message 06-17-2023.  Patient needing to discuss his biopsy. And how long he wait for a Post Op appointment.  Please advise.  Call:  [phone]

## 2023-06-18 NOTE — Consult Note (Signed)
[facility] Customer service manager [facility]) Accountable Care Organization (ACO) [facility] Liaison Note  06/18/2023  Chase Briggs Apr 17, 1951 [id number]  Location: [facility] RN Hospital Liaison screened the patient remotely at [facility].  Insurance:  Medicare   Chase Briggs is a 72 y.o. male who is a Primary Care Patient of Patel, Earlie Lou, MD [facility]. The patient was screened for readmission hospitalization with noted high risk score for unplanned readmission risk with 1 IP/4 ED in 6 months.  The patient was assessed for potential [facility]) Care Management service needs for post hospital transition for care coordination. Review of patient's electronic medical record reveals patient was admitted with Hyponatremia. Liaison spoke with pt and introduced community care management for further assistance in managing his ongoing care. Pt verified he has a good support system and able to obtain his medications and get to his medical appointments. Pt feels at this time he is managing his care fairly well and opt to declined any referral for care management services at this time. No needed referral at this time.  [facility] Care Management/Population Health does not replace or interfere with any arrangements made by the Inpatient Transition of Care team.   For questions contact:   Elliot Cousin, RN, [facility] Liaison [place]   Population Health Office Hours MTWF  8:00 am-6:00 pm [phone] mobile [phone] [Office toll free line] Office Hours are M-F 8:30 - 5 pm [email or web address]

## 2023-06-22 ENCOUNTER — Other Ambulatory Visit: Payer: Self-pay | Admitting: "Endocrinology

## 2023-06-22 ENCOUNTER — Telehealth: Payer: Self-pay

## 2023-06-22 MED ORDER — GLIPIZIDE ER 2.5 MG PO TB24: 2.5000 mg | ORAL_TABLET | Freq: Every day | ORAL | 0 refills | Status: DC

## 2023-06-22 MED ORDER — LEVOFLOXACIN 750 MG PO TABS
750.0000 mg | ORAL_TABLET | Freq: Once | ORAL | 0 refills | Status: AC
Start: 2023-06-22 — End: 2023-06-22

## 2023-06-22 NOTE — Telephone Encounter (Signed)
Pt called BG readings.   Date Before breakfast Before lunch Before supper Bedtime  06/19/23 80   164  06/20/23 100   206  06/21/23 85             Pt taking: Lantus 10 units daily each morning. Pt states that throughout the day he has randomly checked his BG and had readings of between 200-300 usually around 3 or 4pm. Pt states he was recently discharged from hospital on 09/30. Please advise.

## 2023-06-22 NOTE — Telephone Encounter (Signed)
Spoke with pt advising him to add glipizide 2.5mg  daily with breakfast per Dr.Nida's orders. Pt voiced understanding.

## 2023-06-22 NOTE — Telephone Encounter (Signed)
  Based on Dr. Dimas Millin consult note on 06/10/2023 during patient's hospital admission, the plan for next step is outpatient prostate biopsy, not office visit. Has the biopsy been scheduled?

## 2023-06-22 NOTE — Telephone Encounter (Signed)
Patient is made aware and voiced understanding  Based on Dr. Dimas Millin consult note on 06/10/2023 during patient's hospital admission, the plan for next step is outpatient prostate biopsy, not office visit. Has the biopsy been scheduled?   Patient has been scheduled for biopsy and instruction given via phone and mailed out

## 2023-06-23 ENCOUNTER — Encounter: Payer: Self-pay | Admitting: Internal Medicine

## 2023-06-23 ENCOUNTER — Ambulatory Visit (INDEPENDENT_AMBULATORY_CARE_PROVIDER_SITE_OTHER): Payer: Medicare Other | Admitting: Internal Medicine

## 2023-06-23 VITALS — BP 134/58 | HR 88 | Ht 67.0 in | Wt 123.0 lb

## 2023-06-23 DIAGNOSIS — G4700 Insomnia, unspecified: Secondary | ICD-10-CM

## 2023-06-23 DIAGNOSIS — Z09 Encounter for follow-up examination after completed treatment for conditions other than malignant neoplasm: Secondary | ICD-10-CM | POA: Diagnosis not present

## 2023-06-23 DIAGNOSIS — N4289 Other specified disorders of prostate: Secondary | ICD-10-CM

## 2023-06-23 DIAGNOSIS — E871 Hypo-osmolality and hyponatremia: Secondary | ICD-10-CM

## 2023-06-23 DIAGNOSIS — E441 Mild protein-calorie malnutrition: Secondary | ICD-10-CM

## 2023-06-23 DIAGNOSIS — I10 Essential (primary) hypertension: Secondary | ICD-10-CM

## 2023-06-23 MED ORDER — MIRTAZAPINE 15 MG PO TABS
15.0000 mg | ORAL_TABLET | Freq: Every day | ORAL | 3 refills | Status: DC
Start: 2023-06-23 — End: 2023-10-14

## 2023-06-23 NOTE — Assessment & Plan Note (Signed)
BMI Readings from Last 3 Encounters:  06/23/23 19.26 kg/m  06/09/23 19.42 kg/m  06/08/23 19.48 kg/m   Has weight loss Has decreased appetite Undergoing evaluation of prostate mass Added Remeron Advised to take protein supplement such as Glucerna

## 2023-06-23 NOTE — Assessment & Plan Note (Signed)
CT abdomen showed ring enhancing prostatic mass Needs prostate biopsy Can hold aspirin and Plavix 5 days prior to the procedure

## 2023-06-23 NOTE — Progress Notes (Signed)
Established Patient Office Visit  Subjective:  Patient ID: Chase Briggs, male    DOB: 03-14-1951  Age: 72 y.o. MRN: [id number]  CC:  Chief Complaint  Patient presents with   Hospitalization Follow-up    Hospital Follow Up    HPI Chase Briggs is a 72 y.o. male with past medical history of HTN, PAD, allergic rhinitis, type II DM, HLD, insomnia, anemia and tobacco abuse who presents for follow-up after recent hospitalization from 06/09/23-06/14/23.  He was recently told to go to [facility], ER for hyponatremia test 117.  He was admitted for hyponatremia likely due to SIADH, HCTZ use and poor p.o. intake.  He was initially given IV fluid, which had suddenly increased his sodium to 123, but had stayed stable around there and later improved to 129.  He was having dizziness, which has improved now.  His HCTZ had been discontinued in the last visit.  His lisinopril dose has been decreased to 20 mg QD from hospital visit.  He was also found to have a prostate mass on CT abdomen.  He is planned to get US guided prostate biopsy on 07/29/23.  He currently denies any dysuria or hematuria.  He has been treated with ciprofloxacin initially and later was given Bactrim for epididymoorchitis by Urology.  He still reports poor appetite.  He has been anxious and has insomnia due to recent events.  He is currently taking trazodone 100 mg nightly, but does not help much.  Past Medical History:  Diagnosis Date   Diabetes mellitus    Fatty liver    GERD (gastroesophageal reflux disease)    HTN (hypertension)    Hyperlipidemia    Insomnia    Renal insufficiency     Past Surgical History:  Procedure Laterality Date   ABDOMINAL AORTOGRAM W/LOWER EXTREMITY N/A 08/22/2018   Procedure: ABDOMINAL AORTOGRAM W/LOWER EXTREMITY;  Surgeon: [name], MD;  Location: [facility] INVASIVE CV LAB;  Service: Cardiovascular;  Laterality: N/A;   BACK SURGERY     CARPAL TUNNEL WITH CUBITAL TUNNEL  Right 03/21/2019   Procedure: RIGHT CARPAL TUNNEL AND CUBITAL  TUNNEL RELEASES;  Surgeon: [name], MD;  Location: [facility];  Service: Orthopedics;  Laterality: Right;  AXILLARY BLOCK   COLONOSCOPY  01/2007   Dr. [name], reviewed report, mild diverticulosis. Prep adequate. Next TCS due 01/2017   ESOPHAGOGASTRODUODENOSCOPY N/A 10/21/2012   [id number] GASTRITIS   MASS EXCISION Right 10/26/2012   Procedure: EXCISION MASS;  Surgeon: [name], MD;  Location: [facility];  Service: General;  Laterality: Right;   PERIPHERAL VASCULAR INTERVENTION  08/22/2018   Procedure: PERIPHERAL VASCULAR INTERVENTION;  Surgeon: [name], MD;  Location: [facility] INVASIVE CV LAB;  Service: Cardiovascular;;  bilateral external iliac stents   scalp mass  4-5 yrs ago   Dr Angelique Holm NERVE TRANSPOSITION Right 03/21/2019   Procedure: DECOMPRESSION RIGHT ULNAR NERVE;  Surgeon: [name], MD;  Location: [facility];  Service: Orthopedics;  Laterality: Right;    Family History  Problem Relation Age of Onset   Diabetes Mother    Diabetes Father    Diabetes Brother    Colon cancer Neg Hx    Liver disease Neg Hx     Social History   Socioeconomic History   Marital status: Married    Spouse name: Not on file   Number of children: 2   Years of education: Not on file   Highest education level: Not  on file  Occupational History   Occupation: reitred    Comment: [employer], heavy lifting  Tobacco Use   Smoking status: Every Day    Current packs/day: 0.50    Average packs/day: 0.5 packs/day for 35.0 years (17.5 ttl pk-yrs)    Types: Cigarettes   Smokeless tobacco: Never  Vaping Use   Vaping status: Never Used  Substance and Sexual Activity   Alcohol use: No    Comment: hx heavy etoh x 3 yrs, quit 30+ yrs ago   Drug use: No   Sexual activity: Yes    Birth control/protection: None  Other Topics Concern   Not on file  Social History Narrative   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 Highest level of education: 12th grade. Married for 12 years. Has 2 sons (33 and 21). Lives with wife.   Social Determinants of Health   Financial Resource Strain: Not on file  Food Insecurity: No Food Insecurity (06/09/2023)   Hunger Vital Sign    Worried About Running Out of Food in the Last Year: Never true    Ran Out of Food in the Last Year: Never true  Transportation Needs: No Transportation Needs (06/09/2023)   PRAPARE - Administrator, Civil Service (Medical): No    Lack of Transportation (Non-Medical): No  Physical Activity: Not on file  Stress: Not on file  Social Connections: Not on file  Intimate Partner Violence: Not At Risk (06/09/2023)   Humiliation, Afraid, Rape, and Kick questionnaire    Fear of Current or Ex-Partner: No    Emotionally Abused: No    Physically Abused: No    Sexually Abused: No    Outpatient Medications Prior to Visit  Medication Sig Dispense Refill   albuterol (VENTOLIN HFA) 108 (90 Base) MCG/ACT inhaler Inhale 2 puffs into the lungs every 6 (six) hours as needed for wheezing or shortness of breath. 20.1 g 0   amLODipine (NORVASC) 10 MG tablet Take 1 tablet (10 mg total) by mouth daily. 90 tablet 3   aspirin EC 81 MG tablet Take 1 tablet (81 mg total) by mouth daily with breakfast. 30 tablet 11   atorvastatin (LIPITOR) 10 MG tablet Take 1 tablet (10 mg total) by mouth daily. 90 tablet 3   cetirizine (ZYRTEC) 10 MG tablet Take 1 tablet (10 mg total) by mouth daily. 30 tablet 0   Cholecalciferol (VITAMIN D) 125 MCG (5000 UT) CAPS Take 2,000 Units by mouth daily. 30 capsule 3   cloNIDine (CATAPRES) 0.1 MG tablet Take 1 tablet (0.1 mg total) by mouth 2 (two) times daily. 180 tablet 3   clopidogrel (PLAVIX) 75 MG tablet TAKE (1) TABLET BY MOUTH ONCE DAILY. 90 tablet 3   EPINEPHrine 0.3 mg/0.3 mL IJ SOAJ injection Inject 0.3 mg into the muscle as needed for anaphylaxis. 1 each 1   ferrous sulfate 325 (65 FE) MG EC tablet Take  325 mg by mouth daily with breakfast.     glipiZIDE (GLUCOTROL XL) 2.5 MG 24 hr tablet TAKE 1 TABLET(2.5 MG) BY MOUTH DAILY WITH BREAKFAST 90 tablet 0   glucose blood test strip 1 each by Other route 2 (two) times daily. Use as instructed bid E11.65 Relion Premier 100 each 5   insulin glargine (LANTUS SOLOSTAR) 100 UNIT/ML Solostar Pen Inject 10 Units into the skin daily.     Lancets MISC 1 each by Does not apply route 2 (two) times daily. E11.65 Relion Premier 100 each 5   lisinopril (  ZESTRIL) 20 MG tablet Take 1 tablet (20 mg total) by mouth daily. 30 tablet 1   mupirocin ointment (BACTROBAN) 2 % Apply 1 Application topically 2 (two) times daily. Over upper lip area. 22 g 0   nystatin (MYCOSTATIN) 100000 UNIT/ML suspension Take 5 mLs (500,000 Units total) by mouth 4 (four) times daily. 60 mL 0   RESTASIS 0.05 % ophthalmic emulsion Place 1 drop into both eyes 2 (two) times daily as needed.     SURE COMFORT PEN NEEDLES 32G X 4 MM MISC USE ONCE DAILY 100 each 3   traMADol (ULTRAM) 50 MG tablet Take 50 mg by mouth at bedtime.     traZODone (DESYREL) 100 MG tablet Take 1 tablet (100 mg total) by mouth at bedtime.     No facility-administered medications prior to visit.    No Known Allergies  ROS Review of Systems  Constitutional:  Positive for appetite change and fatigue. Negative for chills and fever.  HENT:  Negative for congestion, postnasal drip and sore throat.   Respiratory:  Negative for cough and shortness of breath.   Cardiovascular:  Negative for chest pain and palpitations.  Gastrointestinal:  Positive for constipation. Negative for diarrhea and vomiting.  Genitourinary:  Negative for dysuria and hematuria.       Right scrotal pain  Musculoskeletal:  Positive for back pain. Negative for neck pain and neck stiffness.  Skin:  Negative for rash.  Neurological:  Positive for dizziness. Negative for weakness.  Psychiatric/Behavioral:  Positive for sleep disturbance. Negative for  agitation and behavioral problems.       Objective:    Physical Exam Vitals reviewed.  Constitutional:      General: He is not in acute distress.    Appearance: He is not diaphoretic.  HENT:     Head: Normocephalic and atraumatic.     Nose: No congestion.     Mouth/Throat:     Mouth: Mucous membranes are moist.  Eyes:     General: No scleral icterus.    Extraocular Movements: Extraocular movements intact.  Cardiovascular:     Rate and Rhythm: Normal rate and regular rhythm.     Heart sounds: Normal heart sounds. No murmur heard. Pulmonary:     Breath sounds: Normal breath sounds. No wheezing or rales.  Genitourinary:    Testes:        Right: Tenderness (Mild) present.  Musculoskeletal:     Cervical back: Neck supple. No tenderness.     Right lower leg: No edema.     Left lower leg: No edema.  Skin:    General: Skin is warm.     Findings: No rash.     Comments: Clubbing of fingernails  Neurological:     General: No focal deficit present.     Mental Status: He is alert and oriented to person, place, and time.  Psychiatric:        Mood and Affect: Mood is anxious.        Behavior: Behavior normal.     BP (!) 134/58 (BP Location: Left Arm)   Pulse 88   Ht 5\' 7"  (1.702 m)   Wt 123 lb (55.8 kg)   SpO2 97%   BMI 19.26 kg/m  Wt Readings from Last 3 Encounters:  06/23/23 123 lb (55.8 kg)  06/09/23 124 lb (56.2 kg)  06/08/23 124 lb 6.4 oz (56.4 kg)    Lab Results  Component Value Date   TSH 0.555 06/09/2023   Lab Results  Component Value Date   WBC 6.6 06/14/2023   HGB 10.5 (L) 06/14/2023   HCT 30.3 (L) 06/14/2023   MCV 92.7 06/14/2023   PLT 208 06/14/2023   Lab Results  Component Value Date   NA 129 (L) 06/14/2023   K 5.1 06/14/2023   CO2 23 06/14/2023   GLUCOSE 98 06/14/2023   BUN 14 06/14/2023   CREATININE 1.19 06/14/2023   BILITOT 0.6 06/09/2023   ALKPHOS 98 06/09/2023   AST 19 06/09/2023   ALT 20 06/09/2023   PROT 7.9 06/09/2023   ALBUMIN  3.6 06/10/2023   CALCIUM 9.3 06/14/2023   ANIONGAP 10 06/14/2023   EGFR 46 (L) 06/08/2023   Lab Results  Component Value Date   CHOL 108 08/18/2022   Lab Results  Component Value Date   HDL 48 08/18/2022   Lab Results  Component Value Date   LDLCALC 49 08/18/2022   Lab Results  Component Value Date   TRIG 45 08/18/2022   Lab Results  Component Value Date   CHOLHDL 2.3 08/18/2022   Lab Results  Component Value Date   HGBA1C 6.3 (H) 06/09/2023      Assessment & Plan:   Problem List Items Addressed This Visit       Cardiovascular and Mediastinum   Essential hypertension    BP Readings from Last 1 Encounters:  06/23/23 (!) 134/58   Well-controlled with amlodipine 10 mg QD, lisinopril 20 mg QD and clonidine 0.1 mg BID  Decreased lisinopril from 40 mg to 20 mg due to hyponatremia Followed by cardiology Counseled for compliance with the medications Advised DASH diet and moderate exercise/walking, at least 150 mins/week      Relevant Orders   CBC     Other   Mild protein-calorie malnutrition (HCC)    BMI Readings from Last 3 Encounters:  06/23/23 19.26 kg/m  06/09/23 19.42 kg/m  06/08/23 19.48 kg/m   Has weight loss Has decreased appetite Undergoing evaluation of prostate mass Added Remeron Advised to take protein supplement such as Glucerna      Insomnia    Takes trazodone 100 mg nightly, still has difficulty maintaining sleep Switched to Remeron 15 mg at bedtime - has taken it in the past, can also help with appetite Sleep hygiene material provided      Relevant Medications   mirtazapine (REMERON) 15 MG tablet   Hospital discharge follow-up    Hospital chart reviewed, including discharge summary Medications reconciled and reviewed with the patient in detail      Hyponatremia - Primary    Sodium: 117 --> 129 Discontinued hydrochlorothiazide, decreased lisinopril to 20 mg once daily Component of SIADH, poor PO intake and due to  hydrochlorothiazide, although he had been taking hydrochlorothiazide for many years Check BMP      Relevant Orders   Basic Metabolic Panel (BMET)   Prostate mass    CT abdomen showed ring enhancing prostatic mass Needs prostate biopsy Can hold aspirin and Plavix 5 days prior to the procedure      Relevant Orders   CBC    Meds ordered this encounter  Medications   mirtazapine (REMERON) 15 MG tablet    Sig: Take 1 tablet (15 mg total) by mouth at bedtime.    Dispense:  30 tablet    Refill:  3    Follow-up: No follow-ups on file.    Anabel Halon, MD

## 2023-06-23 NOTE — Patient Instructions (Addendum)
Please start taking Mirtazepine instead of Trazodone.  Please start taking Glipizide as prescribed by Dr. Fransico Him.  Please continue to take other medications as prescribed.  Please stop taking Aspirin and Plavix 5 days before the prostate biopsy.

## 2023-06-23 NOTE — Assessment & Plan Note (Signed)
Takes trazodone 100 mg nightly, still has difficulty maintaining sleep Switched to Remeron 15 mg at bedtime - has taken it in the past, can also help with appetite Sleep hygiene material provided

## 2023-06-23 NOTE — Assessment & Plan Note (Signed)
Sodium: 117 --> 129 Discontinued hydrochlorothiazide, decreased lisinopril to 20 mg once daily Component of SIADH, poor PO intake and due to hydrochlorothiazide, although he had been taking hydrochlorothiazide for many years Check BMP

## 2023-06-23 NOTE — Assessment & Plan Note (Addendum)
BP Readings from Last 1 Encounters:  06/23/23 (!) 134/58   Well-controlled with amlodipine 10 mg QD, lisinopril 20 mg QD and clonidine 0.1 mg BID  Decreased lisinopril from 40 mg to 20 mg due to hyponatremia Followed by cardiology Counseled for compliance with the medications Advised DASH diet and moderate exercise/walking, at least 150 mins/week

## 2023-06-23 NOTE — Assessment & Plan Note (Signed)
Hospital chart reviewed, including discharge summary Medications reconciled and reviewed with the patient in detail 

## 2023-06-24 LAB — CBC
Hematocrit: 35 % — ABNORMAL LOW (ref 37.5–51.0)
Hemoglobin: 11.3 g/dL — ABNORMAL LOW (ref 13.0–17.7)
MCH: 32.1 pg (ref 26.6–33.0)
MCHC: 32.3 g/dL (ref 31.5–35.7)
MCV: 99 fL — ABNORMAL HIGH (ref 79–97)
Platelets: 319 10*3/uL (ref 150–450)
RBC: 3.52 x10E6/uL — ABNORMAL LOW (ref 4.14–5.80)
RDW: 13.1 % (ref 11.6–15.4)
WBC: 13.7 10*3/uL — ABNORMAL HIGH (ref 3.4–10.8)

## 2023-06-24 LAB — BASIC METABOLIC PANEL
BUN/Creatinine Ratio: 11 (ref 10–24)
BUN: 13 mg/dL (ref 8–27)
CO2: 22 mmol/L (ref 20–29)
Calcium: 9.4 mg/dL (ref 8.6–10.2)
Chloride: 94 mmol/L — ABNORMAL LOW (ref 96–106)
Creatinine, Ser: 1.19 mg/dL (ref 0.76–1.27)
Glucose: 167 mg/dL — ABNORMAL HIGH (ref 70–99)
Potassium: 4.5 mmol/L (ref 3.5–5.2)
Sodium: 132 mmol/L — ABNORMAL LOW (ref 134–144)
eGFR: 65 mL/min/{1.73_m2} (ref >59)

## 2023-07-01 ENCOUNTER — Telehealth: Payer: Self-pay

## 2023-07-01 NOTE — Telephone Encounter (Signed)
Spoke with pt advising him to increase his Lantus to 12 units each morning and continue glipizide 2.5mg  daily per Dr.Nida's orders. Pt voiced understanding.

## 2023-07-01 NOTE — Telephone Encounter (Signed)
Pt called with BG readings.   Date Before breakfast Before lunch Before supper Bedtime  06/28/23 160   141  06/29/23 84   224  06/30/23 160   274  07/01/23 111       Pt taking: glipizide 2.5mg  every day, lantus 10 units each morning. Please advise.

## 2023-07-05 ENCOUNTER — Other Ambulatory Visit: Payer: Self-pay

## 2023-07-16 ENCOUNTER — Ambulatory Visit: Payer: Medicare Other | Admitting: Urology

## 2023-07-20 ENCOUNTER — Other Ambulatory Visit: Payer: Self-pay | Admitting: "Endocrinology

## 2023-07-28 ENCOUNTER — Ambulatory Visit (HOSPITAL_COMMUNITY)
Admission: RE | Admit: 2023-07-28 | Discharge: 2023-07-28 | Disposition: A | Discharge status: Home / Self Care | Payer: Medicare Other | Source: Ambulatory Visit | Attending: Urology | Admitting: Urology

## 2023-07-28 ENCOUNTER — Encounter: Payer: Self-pay | Admitting: Urology

## 2023-07-28 ENCOUNTER — Ambulatory Visit: Payer: Medicare Other | Admitting: Urology

## 2023-07-28 ENCOUNTER — Encounter (HOSPITAL_COMMUNITY): Payer: Self-pay

## 2023-07-28 ENCOUNTER — Other Ambulatory Visit: Payer: Self-pay | Admitting: Urology

## 2023-07-28 VITALS — BP 169/83 | HR 95 | Temp 98.0°F | Resp 18

## 2023-07-28 DIAGNOSIS — N411 Chronic prostatitis: Secondary | ICD-10-CM | POA: Diagnosis not present

## 2023-07-28 DIAGNOSIS — R972 Elevated prostate specific antigen [PSA]: Secondary | ICD-10-CM | POA: Diagnosis not present

## 2023-07-28 MED ORDER — LIDOCAINE HCL (PF) 2 % IJ SOLN
10.0000 mL | Freq: Once | INTRAMUSCULAR | Status: AC
  Administered 2023-07-28: 10 mL

## 2023-07-28 MED ORDER — GENTAMICIN SULFATE 40 MG/ML IJ SOLN
80.0000 mg | Freq: Once | INTRAMUSCULAR | Status: AC
  Administered 2023-07-28: 80 mg via INTRAMUSCULAR

## 2023-07-28 MED ORDER — LIDOCAINE HCL (PF) 2 % IJ SOLN
INTRAMUSCULAR | Status: AC
  Filled 2023-07-28: qty 10

## 2023-07-28 MED ORDER — GENTAMICIN SULFATE 40 MG/ML IJ SOLN
INTRAMUSCULAR | Status: AC
  Filled 2023-07-28: qty 2

## 2023-07-28 NOTE — Progress Notes (Signed)
Prostate Biopsy Procedure   Informed consent was obtained after discussing risks/benefits of the procedure.  A time out was performed to ensure correct patient identity.  Pre-Procedure: - Last PSA Level: No results found for: "PSA" - Gentamicin given prophylactically - Levaquin 500 mg administered PO -Transrectal Ultrasound performed revealing a 45.3 gm prostate -No significant hypoechoic or median lobe noted  Procedure: - Prostate block performed using 10 cc 1% lidocaine and biopsies taken from sextant areas, a total of 12 under ultrasound guidance.  Post-Procedure: - Patient tolerated the procedure well - He was counseled to seek immediate medical attention if experiences any severe pain, significant bleeding, or fevers - Return in one week to discuss biopsy results

## 2023-07-28 NOTE — Patient Instructions (Signed)
Transrectal Ultrasound-Guided Prostate Biopsy, Care After What can I expect after the procedure? After the procedure, it is common to have: Pain and discomfort near your butt (rectum), especially while sitting. Pink-colored pee (urine). This is due to small amounts of blood in your pee. A burning feeling while peeing. Blood in your poop (stool). Bleeding from your butt. Blood in your semen. Follow these instructions at home: Medicines Take over-the-counter and prescription medicines only as told by your doctor. If you were given a sedative during your procedure, do not drive or use machines until your doctor says that it is safe. A sedative is a medicine that helps you relax. If you were prescribed an antibiotic medicine, take it as told by your doctor. Do not stop taking it even if you start to feel better. Activity  Return to your normal activities when your doctor says that it is safe. Ask your doctor when it is okay for you to have sex. You may have to avoid lifting. Ask your doctor how much you can safely lift. General instructions  Drink enough water to keep your pee pale yellow. Watch your pee, poop, and semen for new bleeding or bleeding that gets worse. Keep all follow-up visits. Contact a doctor if: You have any of these: Blood clots in your pee or poop. Blood in your pee more than 2 weeks after the procedure. Blood in your semen more than 2 months after the procedure. New or worse bleeding in your pee, poop, or semen. Very bad belly pain. Your pee smells bad or unusual. You have trouble peeing. Your lower belly feels firm. You have problems getting an erection. You feel like you may vomit (are nauseous), or you vomit. Get help right away if: You have a fever or chills. You have bright red pee. You have very bad pain that does not get better with medicine. You cannot pee. Summary After this procedure, it is common to have pain and discomfort near your butt,  especially while sitting. You may have blood in your pee and poop. It is common to have blood in your semen. Get help right away if you have a fever or chills. This information is not intended to replace advice given to you by your health care provider. Make sure you discuss any questions you have with your health care provider. Document Revised: 02/24/2021 Document Reviewed: 02/24/2021 Elsevier Patient Education  2024 [employer].

## 2023-07-28 NOTE — Progress Notes (Signed)
PT tolerated prostate biopsy procedure and antibiotic injection well today. Labs obtained and sent for pathology. PT ambulatory at discharge with no acute distress noted and verbalized understanding of discharge instructions.  

## 2023-07-29 ENCOUNTER — Ambulatory Visit (INDEPENDENT_AMBULATORY_CARE_PROVIDER_SITE_OTHER): Payer: Medicare Other | Admitting: Internal Medicine

## 2023-07-29 ENCOUNTER — Encounter: Payer: Self-pay | Admitting: Internal Medicine

## 2023-07-29 ENCOUNTER — Other Ambulatory Visit: Payer: Self-pay

## 2023-07-29 ENCOUNTER — Encounter (HOSPITAL_COMMUNITY): Payer: Self-pay | Admitting: *Deleted

## 2023-07-29 ENCOUNTER — Emergency Department (HOSPITAL_COMMUNITY)
Admission: EM | Admit: 2023-07-29 | Discharge: 2023-07-29 | Disposition: A | Discharge status: Home / Self Care | Payer: Medicare Other | Attending: Emergency Medicine | Admitting: Emergency Medicine

## 2023-07-29 VITALS — BP 138/72 | HR 83 | Ht 67.0 in | Wt 122.8 lb

## 2023-07-29 DIAGNOSIS — I10 Essential (primary) hypertension: Secondary | ICD-10-CM | POA: Insufficient documentation

## 2023-07-29 DIAGNOSIS — E441 Mild protein-calorie malnutrition: Secondary | ICD-10-CM | POA: Diagnosis not present

## 2023-07-29 DIAGNOSIS — Z79899 Other long term (current) drug therapy: Secondary | ICD-10-CM | POA: Diagnosis not present

## 2023-07-29 DIAGNOSIS — Z7984 Long term (current) use of oral hypoglycemic drugs: Secondary | ICD-10-CM | POA: Insufficient documentation

## 2023-07-29 DIAGNOSIS — E119 Type 2 diabetes mellitus without complications: Secondary | ICD-10-CM | POA: Diagnosis not present

## 2023-07-29 DIAGNOSIS — R040 Epistaxis: Secondary | ICD-10-CM | POA: Diagnosis not present

## 2023-07-29 DIAGNOSIS — Z7982 Long term (current) use of aspirin: Secondary | ICD-10-CM | POA: Insufficient documentation

## 2023-07-29 DIAGNOSIS — I739 Peripheral vascular disease, unspecified: Secondary | ICD-10-CM

## 2023-07-29 DIAGNOSIS — N4289 Other specified disorders of prostate: Secondary | ICD-10-CM

## 2023-07-29 DIAGNOSIS — Z794 Long term (current) use of insulin: Secondary | ICD-10-CM | POA: Insufficient documentation

## 2023-07-29 LAB — CBC WITH DIFFERENTIAL/PLATELET
Abs Immature Granulocytes: 0.03 10*3/uL (ref 0.00–0.07)
Basophils Absolute: 0.1 10*3/uL (ref 0.0–0.1)
Basophils Relative: 1 %
Eosinophils Absolute: 0.1 10*3/uL (ref 0.0–0.5)
Eosinophils Relative: 2 %
HCT: 40.1 % (ref 39.0–52.0)
Hemoglobin: 13 g/dL (ref 13.0–17.0)
Immature Granulocytes: 0 %
Lymphocytes Relative: 33 %
Lymphs Abs: 3.2 10*3/uL (ref 0.7–4.0)
MCH: 31.5 pg (ref 26.0–34.0)
MCHC: 32.4 g/dL (ref 30.0–36.0)
MCV: 97.1 fL (ref 80.0–100.0)
Monocytes Absolute: 0.7 10*3/uL (ref 0.1–1.0)
Monocytes Relative: 8 %
Neutro Abs: 5.5 10*3/uL (ref 1.7–7.7)
Neutrophils Relative %: 56 %
Platelets: 290 10*3/uL (ref 150–400)
RBC: 4.13 MIL/uL — ABNORMAL LOW (ref 4.22–5.81)
RDW: 13.2 % (ref 11.5–15.5)
WBC: 9.6 10*3/uL (ref 4.0–10.5)
nRBC: 0 % (ref 0.0–0.2)

## 2023-07-29 LAB — BASIC METABOLIC PANEL
Anion gap: 11 (ref 5–15)
BUN: 12 mg/dL (ref 8–23)
CO2: 26 mmol/L (ref 22–32)
Calcium: 9.1 mg/dL (ref 8.9–10.3)
Chloride: 98 mmol/L (ref 98–111)
Creatinine, Ser: 1.16 mg/dL (ref 0.61–1.24)
GFR, Estimated: >60 mL/min (ref >60)
Glucose, Bld: 149 mg/dL — ABNORMAL HIGH (ref 70–99)
Potassium: 3.5 mmol/L (ref 3.5–5.1)
Sodium: 135 mmol/L (ref 135–145)

## 2023-07-29 NOTE — ED Triage Notes (Signed)
Pt with nose bleed that started and hour ago.  Pt is on Plavix , last took 11/6.  Pt with recent biopsy of prostate yesterday

## 2023-07-29 NOTE — Patient Instructions (Signed)
Please continue taking Lisinopril, Amlodipine and Clonidine as prescribed.  Please start taking protein supplement such as Ensure or Glucerna at least once daily.  Please check BP at least once daily and contact us if BP is consistently above 140/90.

## 2023-07-29 NOTE — Assessment & Plan Note (Signed)
S/p right and left iliac stent placement Followed by vascular surgery and cardiology On aspirin, Plavix and statin - had held Aspirin and Plavix for biopsy

## 2023-07-29 NOTE — Assessment & Plan Note (Signed)
CT abdomen showed ring enhancing prostatic mass Had prostate biopsy on 07/29/23 - awaiting biopsy Followed by urology

## 2023-07-29 NOTE — Progress Notes (Signed)
Established Patient Office Visit  Subjective:  Patient ID: Chase Briggs, male    DOB: 1951-07-07  Age: 72 y.o. MRN: [id number]  CC:  Chief Complaint  Patient presents with   Hypertension    Follow up   Fatigue    Follow up     HPI Chase Briggs is a 72 y.o. male with past medical history of HTN, PAD, allergic rhinitis, type II DM, HLD, insomnia, anemia and tobacco abuse who presents for f/u of his chronic medical conditions.  HTN: His blood pressure was borderline elevated, but overall acceptable considering recent decrease in lisinopril due to hyponatremia.  He has noticed mild improvement in fatigue and dizziness.  Denies any chest pain, dyspnea or palpitations currently.  His repeat BMP had shown improvement in kidney function and sodium-up to 132.  He has started taking Remeron for improving appetite and sleep quality.  He still has not started protein supplement.  His appetite is slowly improving.  Prostatic mass: He had biopsy of prostate yesterday.  He has noticed mild hematuria this morning, but denies any dysuria, urinary hesitancy or resistance.  PAD: Has had right and left iliac stent placement, followed by vascular surgery.  He takes aspirin, Plavix and statin.  Denies any recent worsening of claudication symptoms.   Type II DM with HLD: He takes Lantus 10 units in the morning and glipizide 2.5 mg QD, followed by Dr. Fransico Him.  Denies any fatigue, polyuria or polydipsia.  He takes Lipitor for HLD. Denies any fever, chills, chronic cough, hemoptysis, or night sweats.    Past Medical History:  Diagnosis Date   Diabetes mellitus    Fatty liver    GERD (gastroesophageal reflux disease)    HTN (hypertension)    Hyperlipidemia    Insomnia    Renal insufficiency     Past Surgical History:  Procedure Laterality Date   ABDOMINAL AORTOGRAM W/LOWER EXTREMITY N/A 08/22/2018   Procedure: ABDOMINAL AORTOGRAM W/LOWER EXTREMITY;  Surgeon: [name],  MD;  Location: [facility] INVASIVE CV LAB;  Service: Cardiovascular;  Laterality: N/A;   BACK SURGERY     CARPAL TUNNEL WITH CUBITAL TUNNEL Right 03/21/2019   Procedure: RIGHT CARPAL TUNNEL AND CUBITAL  TUNNEL RELEASES;  Surgeon: [name], MD;  Location: [facility];  Service: Orthopedics;  Laterality: Right;  AXILLARY BLOCK   COLONOSCOPY  01/2007   Dr. [name], reviewed report, mild diverticulosis. Prep adequate. Next TCS due 01/2017   ESOPHAGOGASTRODUODENOSCOPY N/A 10/21/2012   GBT:DVVOHYWV GASTRITIS   MASS EXCISION Right 10/26/2012   Procedure: EXCISION MASS;  Surgeon: [name], MD;  Location: [facility];  Service: General;  Laterality: Right;   PERIPHERAL VASCULAR INTERVENTION  08/22/2018   Procedure: PERIPHERAL VASCULAR INTERVENTION;  Surgeon: [name], MD;  Location: [facility] INVASIVE CV LAB;  Service: Cardiovascular;;  bilateral external iliac stents   scalp mass  4-5 yrs ago   Dr Angelique Holm NERVE TRANSPOSITION Right 03/21/2019   Procedure: DECOMPRESSION RIGHT ULNAR NERVE;  Surgeon: [name], MD;  Location: [facility];  Service: Orthopedics;  Laterality: Right;    Family History  Problem Relation Age of Onset   Diabetes Mother    Diabetes Father    Diabetes Brother    Colon cancer Neg Hx    Liver disease Neg Hx     Social History   Socioeconomic History   Marital status: Married    Spouse name: Not on file   Number of  children: 2   Years of education: Not on file   Highest education level: Not on file  Occupational History   Occupation: reitred    Comment: [employer], heavy lifting  Tobacco Use   Smoking status: Every Day    Current packs/day: 0.50    Average packs/day: 0.5 packs/day for 35.0 years (17.5 ttl pk-yrs)    Types: Cigarettes   Smokeless tobacco: Never  Vaping Use   Vaping status: Never Used  Substance and Sexual Activity   Alcohol use: No    Comment: hx heavy etoh x 3 yrs, quit 30+ yrs ago   Drug use: No   Sexual  activity: Yes    Birth control/protection: None  Other Topics Concern   Not on file  Social History Narrative   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 Highest level of education: 12th grade. Married for 12 years. Has 2 sons (33 and 21). Lives with wife.   Social Determinants of Health   Financial Resource Strain: Not on file  Food Insecurity: No Food Insecurity (06/09/2023)   Hunger Vital Sign    Worried About Running Out of Food in the Last Year: Never true    Ran Out of Food in the Last Year: Never true  Transportation Needs: No Transportation Needs (06/09/2023)   PRAPARE - Administrator, Civil Service (Medical): No    Lack of Transportation (Non-Medical): No  Physical Activity: Not on file  Stress: Not on file  Social Connections: Not on file  Intimate Partner Violence: Not At Risk (06/09/2023)   Humiliation, Afraid, Rape, and Kick questionnaire    Fear of Current or Ex-Partner: No    Emotionally Abused: No    Physically Abused: No    Sexually Abused: No    Outpatient Medications Prior to Visit  Medication Sig Dispense Refill   albuterol (VENTOLIN HFA) 108 (90 Base) MCG/ACT inhaler Inhale 2 puffs into the lungs every 6 (six) hours as needed for wheezing or shortness of breath. 20.1 g 0   amLODipine (NORVASC) 10 MG tablet Take 1 tablet (10 mg total) by mouth daily. 90 tablet 3   aspirin EC 81 MG tablet Take 1 tablet (81 mg total) by mouth daily with breakfast. 30 tablet 11   atorvastatin (LIPITOR) 10 MG tablet Take 1 tablet (10 mg total) by mouth daily. 90 tablet 3   cetirizine (ZYRTEC) 10 MG tablet Take 1 tablet (10 mg total) by mouth daily. 30 tablet 0   Cholecalciferol (VITAMIN D) 125 MCG (5000 UT) CAPS Take 2,000 Units by mouth daily. 30 capsule 3   cloNIDine (CATAPRES) 0.1 MG tablet Take 1 tablet (0.1 mg total) by mouth 2 (two) times daily. 180 tablet 3   clopidogrel (PLAVIX) 75 MG tablet TAKE (1) TABLET BY MOUTH ONCE DAILY. 90 tablet 3   EPINEPHrine 0.3 mg/0.3  mL IJ SOAJ injection Inject 0.3 mg into the muscle as needed for anaphylaxis. 1 each 1   ferrous sulfate 325 (65 FE) MG EC tablet Take 325 mg by mouth daily with breakfast.     glipiZIDE (GLUCOTROL XL) 2.5 MG 24 hr tablet TAKE 1 TABLET(2.5 MG) BY MOUTH DAILY WITH BREAKFAST 90 tablet 0   glucose blood test strip 1 each by Other route 2 (two) times daily. Use as instructed bid E11.65 Relion Premier 100 each 5   insulin glargine (LANTUS SOLOSTAR) 100 UNIT/ML Solostar Pen Inject 10 Units into the skin daily.     Lancets MISC 1 each by Does  not apply route 2 (two) times daily. E11.65 Relion Premier 100 each 5   lisinopril (ZESTRIL) 20 MG tablet Take 1 tablet (20 mg total) by mouth daily. 30 tablet 1   mirtazapine (REMERON) 15 MG tablet Take 1 tablet (15 mg total) by mouth at bedtime. 30 tablet 3   mupirocin ointment (BACTROBAN) 2 % Apply 1 Application topically 2 (two) times daily. Over upper lip area. 22 g 0   nystatin (MYCOSTATIN) 100000 UNIT/ML suspension Take 5 mLs (500,000 Units total) by mouth 4 (four) times daily. 60 mL 0   RESTASIS 0.05 % ophthalmic emulsion Place 1 drop into both eyes 2 (two) times daily as needed.     SURE COMFORT PEN NEEDLES 32G X 4 MM MISC USE ONCE DAILY 100 each 3   traMADol (ULTRAM) 50 MG tablet Take 50 mg by mouth at bedtime.     No facility-administered medications prior to visit.    No Known Allergies  ROS Review of Systems  Constitutional:  Positive for appetite change and fatigue. Negative for chills and fever.  HENT:  Negative for congestion, postnasal drip and sore throat.   Respiratory:  Negative for cough and shortness of breath.   Cardiovascular:  Negative for chest pain and palpitations.  Gastrointestinal:  Positive for constipation. Negative for diarrhea and vomiting.  Genitourinary:  Negative for dysuria and hematuria.       Right scrotal pain  Musculoskeletal:  Positive for back pain. Negative for neck pain and neck stiffness.  Skin:  Negative for  rash.  Neurological:  Negative for dizziness and weakness.  Psychiatric/Behavioral:  Positive for sleep disturbance. Negative for agitation and behavioral problems.       Objective:    Physical Exam Vitals reviewed.  Constitutional:      General: He is not in acute distress.    Appearance: He is not diaphoretic.  HENT:     Head: Normocephalic and atraumatic.     Nose: No congestion.     Mouth/Throat:     Mouth: Mucous membranes are moist.  Eyes:     General: No scleral icterus.    Extraocular Movements: Extraocular movements intact.  Cardiovascular:     Rate and Rhythm: Normal rate and regular rhythm.     Heart sounds: Normal heart sounds. No murmur heard. Pulmonary:     Breath sounds: Normal breath sounds. No wheezing or rales.  Genitourinary:    Testes:        Right: Tenderness (Mild) present.  Musculoskeletal:     Cervical back: Neck supple. No tenderness.     Right lower leg: No edema.     Left lower leg: No edema.  Skin:    General: Skin is warm.     Findings: No rash.     Comments: Clubbing of fingernails  Neurological:     General: No focal deficit present.     Mental Status: He is alert and oriented to person, place, and time.  Psychiatric:        Mood and Affect: Mood is anxious.        Behavior: Behavior normal.     BP 138/72 (BP Location: Left Arm)   Pulse 83   Ht 5\' 7"  (1.702 m)   Wt 122 lb 12.8 oz (55.7 kg)   SpO2 97%   BMI 19.23 kg/m  Wt Readings from Last 3 Encounters:  07/29/23 122 lb 12.8 oz (55.7 kg)  06/23/23 123 lb (55.8 kg)  06/09/23 124 lb (56.2 kg)  Lab Results  Component Value Date   TSH 0.555 06/09/2023   Lab Results  Component Value Date   WBC 13.7 (H) 06/23/2023   HGB 11.3 (L) 06/23/2023   HCT 35.0 (L) 06/23/2023   MCV 99 (H) 06/23/2023   PLT 319 06/23/2023   Lab Results  Component Value Date   NA 132 (L) 06/23/2023   K 4.5 06/23/2023   CO2 22 06/23/2023   GLUCOSE 167 (H) 06/23/2023   BUN 13 06/23/2023    CREATININE 1.19 06/23/2023   BILITOT 0.6 06/09/2023   ALKPHOS 98 06/09/2023   AST 19 06/09/2023   ALT 20 06/09/2023   PROT 7.9 06/09/2023   ALBUMIN 3.6 06/10/2023   CALCIUM 9.4 06/23/2023   ANIONGAP 10 06/14/2023   EGFR 65 06/23/2023   Lab Results  Component Value Date   CHOL 108 08/18/2022   Lab Results  Component Value Date   HDL 48 08/18/2022   Lab Results  Component Value Date   LDLCALC 49 08/18/2022   Lab Results  Component Value Date   TRIG 45 08/18/2022   Lab Results  Component Value Date   CHOLHDL 2.3 08/18/2022   Lab Results  Component Value Date   HGBA1C 6.3 (H) 06/09/2023      Assessment & Plan:   Problem List Items Addressed This Visit       Cardiovascular and Mediastinum   Essential hypertension - Primary    BP Readings from Last 1 Encounters:  07/29/23 138/72   Well-controlled with amlodipine 10 mg QD, lisinopril 20 mg QD and clonidine 0.1 mg BID  Had decreased lisinopril from 40 mg to 20 mg due to hyponatremia Followed by cardiology Counseled for compliance with the medications Advised DASH diet and moderate exercise/walking, at least 150 mins/week      PAD (peripheral artery disease) (HCC)    S/p right and left iliac stent placement Followed by vascular surgery and cardiology On aspirin, Plavix and statin - had held Aspirin and Plavix for biopsy        Other   Mild protein-calorie malnutrition (HCC)    BMI Readings from Last 3 Encounters:  07/29/23 19.23 kg/m  06/23/23 19.26 kg/m  06/09/23 19.42 kg/m   Had weight loss and decreased appetite -now improving Undergoing evaluation of prostate mass On Remeron 15 mg at bedtime now Advised to take protein supplement such as Glucerna      Prostate mass    CT abdomen showed ring enhancing prostatic mass Had prostate biopsy on 07/29/23 - awaiting biopsy Followed by urology       No orders of the defined types were placed in this encounter.   Follow-up: Return in about 3  months (around 10/29/2023) for HTN.    Anabel Halon, MD

## 2023-07-29 NOTE — ED Provider Notes (Signed)
[facility] AT [facility] Provider Note   CSN: [id number] Arrival date & time: 07/29/23  1546     History  Chief Complaint  Patient presents with   Epistaxis    Chase Briggs is a 72 y.o. male.  Patient with the onset of nosebleed he felt it was both noses.  It started about an hour prior to arrival.  Was bleeding fairly heavily.  Patient was on Plavix but it was off of it because of a recent prostate biopsy.  He has been off for since November 6.  Patient is not on any other significant blood thinners.  Past medical history in for hypertension diabetes hyperlipidemia renal insufficiency.  Patient states she has had trouble with nosebleeds before but this 1 was much heavier.  Patient does not use any alcohol.  Patient smokes 1/2 pack of cigarettes a day.  Patient is not currently followed by ear nose and throat.       Home Medications Prior to Admission medications   Medication Sig Start Date End Date Taking? Authorizing Provider  albuterol (VENTOLIN HFA) 108 (90 Base) MCG/ACT inhaler Inhale 2 puffs into the lungs every 6 (six) hours as needed for wheezing or shortness of breath. 04/06/23  Yes Anabel Halon, MD  amLODipine (NORVASC) 10 MG tablet Take 1 tablet (10 mg total) by mouth daily. 04/06/23  Yes Anabel Halon, MD  aspirin EC 81 MG tablet Take 1 tablet (81 mg total) by mouth daily with breakfast. 04/23/22  Yes Emokpae, Courage, MD  atorvastatin (LIPITOR) 10 MG tablet Take 1 tablet (10 mg total) by mouth daily. 04/06/23  Yes Anabel Halon, MD  Cholecalciferol (VITAMIN D) 125 MCG (5000 UT) CAPS Take 2,000 Units by mouth daily. 04/23/22  Yes Emokpae, Courage, MD  cloNIDine (CATAPRES) 0.1 MG tablet Take 1 tablet (0.1 mg total) by mouth 2 (two) times daily. 04/06/23  Yes Anabel Halon, MD  clopidogrel (PLAVIX) 75 MG tablet TAKE (1) TABLET BY MOUTH ONCE DAILY. 04/06/23  Yes Anabel Halon, MD  EPINEPHrine 0.3 mg/0.3 mL IJ SOAJ injection Inject  0.3 mg into the muscle as needed for anaphylaxis. 02/10/23  Yes Gardenia Phlegm, MD  ferrous sulfate 325 (65 FE) MG EC tablet Take 325 mg by mouth daily with breakfast.   Yes [provider]  glipiZIDE (GLUCOTROL XL) 2.5 MG 24 hr tablet TAKE 1 TABLET(2.5 MG) BY MOUTH DAILY WITH BREAKFAST 07/20/23  Yes Nida, Denman George, MD  insulin glargine (LANTUS SOLOSTAR) 100 UNIT/ML Solostar Pen Inject 10 Units into the skin daily. 06/14/23  Yes Johnson, Clanford L, MD  levofloxacin (LEVAQUIN) 750 MG tablet Take 750 mg by mouth once. 06/22/23 07/29/23 Yes [provider]  lisinopril (ZESTRIL) 20 MG tablet Take 1 tablet (20 mg total) by mouth daily. 06/14/23  Yes Johnson, Clanford L, MD  mirtazapine (REMERON) 15 MG tablet Take 1 tablet (15 mg total) by mouth at bedtime. 06/23/23  Yes Anabel Halon, MD  mupirocin ointment (BACTROBAN) 2 % Apply 1 Application topically 2 (two) times daily. Over upper lip area. 05/25/23  Yes Anabel Halon, MD  glucose blood test strip 1 each by Other route 2 (two) times daily. Use as instructed bid E11.65 Relion Premier 05/15/19   Roma Kayser, MD  Lancets MISC 1 each by Does not apply route 2 (two) times daily. E11.65 Relion Premier 05/15/19   Roma Kayser, MD  SURE COMFORT PEN NEEDLES 32G X 4 MM  MISC USE ONCE DAILY 10/30/22   Roma Kayser, MD      Allergies    Patient has no known allergies.    Review of Systems   Review of Systems  Constitutional:  Negative for chills and fever.  HENT:  Positive for nosebleeds. Negative for ear pain and sore throat.   Eyes:  Negative for pain and visual disturbance.  Respiratory:  Negative for cough and shortness of breath.   Cardiovascular:  Negative for chest pain and palpitations.  Gastrointestinal:  Negative for abdominal pain and vomiting.  Genitourinary:  Negative for dysuria and hematuria.  Musculoskeletal:  Negative for arthralgias and back pain.  Skin:  Negative for color change and rash.   Neurological:  Negative for seizures and syncope.  All other systems reviewed and are negative.   Physical Exam Updated Vital Signs BP 138/70   Pulse 87   Resp 17   Ht 1.702 m (5\' 7" )   Wt 56.7 kg   SpO2 96%   BMI 19.58 kg/m  Physical Exam Vitals and nursing note reviewed.  Constitutional:      General: He is not in acute distress.    Appearance: Normal appearance. He is well-developed.  HENT:     Head: Normocephalic and atraumatic.     Nose:     Comments: No active bleeding from either nares but most of the bleeding seems to be more consistent with the right nares.    Mouth/Throat:     Comments: No active blood draining down the back of the throat. Eyes:     Extraocular Movements: Extraocular movements intact.     Conjunctiva/sclera: Conjunctivae normal.     Pupils: Pupils are equal, round, and reactive to light.  Cardiovascular:     Rate and Rhythm: Normal rate and regular rhythm.     Heart sounds: No murmur heard. Pulmonary:     Effort: Pulmonary effort is normal. No respiratory distress.     Breath sounds: Normal breath sounds.  Abdominal:     Palpations: Abdomen is soft.     Tenderness: There is no abdominal tenderness.  Musculoskeletal:        General: No swelling.     Cervical back: Normal range of motion and neck supple. No rigidity.  Skin:    General: Skin is warm and dry.     Capillary Refill: Capillary refill takes less than 2 seconds.  Neurological:     General: No focal deficit present.     Mental Status: He is alert and oriented to person, place, and time.  Psychiatric:        Mood and Affect: Mood normal.     ED Results / Procedures / Treatments   Labs (all labs ordered are listed, but only abnormal results are displayed) Labs Reviewed  CBC WITH DIFFERENTIAL/PLATELET - Abnormal; Notable for the following components:      Result Value   RBC 4.13 (*)    All other components within normal limits  BASIC METABOLIC PANEL - Abnormal; Notable for  the following components:   Glucose, Bld 149 (*)    All other components within normal limits    EKG None  Radiology [place] PROSTATE BIOPSY MULTIPLE  Result Date: 07/28/2023 Please see Notes tab for imaging impression.  US Guided Needle Placement  Result Date: 07/28/2023 CLINICAL DATA:  Ultrasound was provided for use by the ordering physician.  No provider Interpretation or professional fees incurred.    [place] Transrectal Complete  Result Date: 07/28/2023  Please see Notes tab for imaging impression.   Procedures Procedures    Medications Ordered in ED Medications - No data to display  ED Course/ Medical Decision Making/ A&P                                 Medical Decision Making Amount and/or Complexity of Data Reviewed Labs: ordered.   Patient with a little bit of scant bleeding noted.  No significant bleeding.  Had a pinch his nose for 20 minutes.  That stopped all bleeding.  He did swallow some clot areas now he will breathe through his nose.  Patient's labs CBC white count 9.6 hemoglobin 13.0 platelets normal at 290.  Basic metabolic panel glucose 149 electrolytes normal and GFR is greater than 60.  Will have patient follow-up with ear nose and throat.  Instructions on pinching his nose provided. Final Clinical Impression(s) / ED Diagnoses Final diagnoses:  Epistaxis    Rx / DC Orders ED Discharge Orders     None         Vanetta Mulders, MD 07/29/23 1842

## 2023-07-29 NOTE — Assessment & Plan Note (Addendum)
BP Readings from Last 1 Encounters:  07/29/23 138/72   Well-controlled with amlodipine 10 mg QD, lisinopril 20 mg QD and clonidine 0.1 mg BID  Had decreased lisinopril from 40 mg to 20 mg due to hyponatremia Followed by cardiology Counseled for compliance with the medications Advised DASH diet and moderate exercise/walking, at least 150 mins/week

## 2023-07-29 NOTE — Assessment & Plan Note (Addendum)
BMI Readings from Last 3 Encounters:  07/29/23 19.23 kg/m  06/23/23 19.26 kg/m  06/09/23 19.42 kg/m   Had weight loss and decreased appetite -now improving Undergoing evaluation of prostate mass On Remeron 15 mg at bedtime now Advised to take protein supplement such as Glucerna

## 2023-07-29 NOTE — Discharge Instructions (Signed)
Make an appointment to follow-up with ear nose and throat.  Avoid manipulating your nose.  If it starts to rebleed pinch it for 20 minutes as instructed.  And blow all the clots out pinch again for 20 minutes if it does not stop it and come back in.  Blood work here today without significant abnormalities.

## 2023-07-30 LAB — SURGICAL PATHOLOGY

## 2023-08-02 DIAGNOSIS — L851 Acquired keratosis [keratoderma] palmaris et plantaris: Secondary | ICD-10-CM | POA: Diagnosis not present

## 2023-08-02 DIAGNOSIS — E1151 Type 2 diabetes mellitus with diabetic peripheral angiopathy without gangrene: Secondary | ICD-10-CM | POA: Diagnosis not present

## 2023-08-02 DIAGNOSIS — B351 Tinea unguium: Secondary | ICD-10-CM | POA: Diagnosis not present

## 2023-08-02 DIAGNOSIS — M79674 Pain in right toe(s): Secondary | ICD-10-CM | POA: Diagnosis not present

## 2023-08-03 ENCOUNTER — Telehealth: Payer: Self-pay

## 2023-08-03 NOTE — Telephone Encounter (Signed)
Patient called and made aware of negative prostate biopsy results. Patient is scheduled to return in 6 months with psa. Both appointments made and sent to patient via mail.

## 2023-08-18 ENCOUNTER — Telehealth: Payer: Self-pay | Admitting: Internal Medicine

## 2023-08-18 ENCOUNTER — Telehealth: Payer: Self-pay

## 2023-08-18 ENCOUNTER — Other Ambulatory Visit: Payer: Self-pay

## 2023-08-18 DIAGNOSIS — I739 Peripheral vascular disease, unspecified: Secondary | ICD-10-CM

## 2023-08-18 MED ORDER — ATORVASTATIN CALCIUM 10 MG PO TABS
10.0000 mg | ORAL_TABLET | Freq: Every day | ORAL | 1 refills | Status: DC
Start: 2023-08-18 — End: 2024-02-10

## 2023-08-18 NOTE — Telephone Encounter (Signed)
Patient calling in regarding his atorvastatin (LIPITOR) 10 MG tablet  medication he stated [facility] said they faxed over the prescription request

## 2023-08-18 NOTE — Telephone Encounter (Signed)
Refilled

## 2023-08-18 NOTE — Telephone Encounter (Signed)
Transition Care Management Unsuccessful Follow-up Telephone Call  Date of discharge and from where:  07/29/2023 [facility]  Attempts:  1st Attempt  Reason for unsuccessful TCM follow-up call:  Left voice message  Lachrista Heslin [facility]  [facility] Resource Care Guide Direct Dial: [phone]  Website: [email or web address]

## 2023-08-19 ENCOUNTER — Telehealth: Payer: Self-pay

## 2023-08-19 NOTE — Telephone Encounter (Signed)
Transition Care Management Follow-up Telephone Call Date of discharge and from where: 07/29/2023 [facility] How have you been since you were released from the hospital? Patient stated his nose bleeds have improved and he is following up with an ENT. Any questions or concerns? No  Items Reviewed: Did the pt receive and understand the discharge instructions provided? Yes  Medications obtained and verified?  No medication prescribed this visit. Other? No  Any new allergies since your discharge? No  Dietary orders reviewed? Yes Do you have support at home? Yes   Follow up appointments reviewed:  PCP Hospital f/u appt confirmed? No  Scheduled to see  on  @ . Specialist Hospital f/u appt confirmed? Yes  Scheduled to see  on 08/25/2023 @ [facility]. Are transportation arrangements needed? No  If their condition worsens, is the pt aware to call PCP or go to the Emergency Dept.? Yes Was the patient provided with contact information for the PCP's office or ED? Yes Was to pt encouraged to call back with questions or concerns? Yes   Kedarius Aloisi [facility]  [facility] Guide Direct Dial: [phone]  Website: [email or web address]

## 2023-08-23 DIAGNOSIS — E1169 Type 2 diabetes mellitus with other specified complication: Secondary | ICD-10-CM | POA: Diagnosis not present

## 2023-08-23 DIAGNOSIS — Z794 Long term (current) use of insulin: Secondary | ICD-10-CM | POA: Diagnosis not present

## 2023-08-24 LAB — COMPREHENSIVE METABOLIC PANEL
ALT: 10 [IU]/L (ref 0–44)
AST: 15 [IU]/L (ref 0–40)
Albumin: 3.9 g/dL (ref 3.8–4.8)
Alkaline Phosphatase: 136 [IU]/L — ABNORMAL HIGH (ref 44–121)
BUN/Creatinine Ratio: 7 — ABNORMAL LOW (ref 10–24)
BUN: 8 mg/dL (ref 8–27)
Bilirubin Total: 0.3 mg/dL (ref 0.0–1.2)
CO2: 25 mmol/L (ref 20–29)
Calcium: 9.5 mg/dL (ref 8.6–10.2)
Chloride: 100 mmol/L (ref 96–106)
Creatinine, Ser: 1.09 mg/dL (ref 0.76–1.27)
Globulin, Total: 3.7 g/dL (ref 1.5–4.5)
Glucose: 165 mg/dL — ABNORMAL HIGH (ref 70–99)
Potassium: 4.4 mmol/L (ref 3.5–5.2)
Sodium: 139 mmol/L (ref 134–144)
Total Protein: 7.6 g/dL (ref 6.0–8.5)
eGFR: 72 mL/min/{1.73_m2} (ref >59)

## 2023-08-24 LAB — LIPID PANEL
Chol/HDL Ratio: 2.2 {ratio} (ref 0.0–5.0)
Cholesterol, Total: 87 mg/dL — ABNORMAL LOW (ref 100–199)
HDL: 40 mg/dL (ref >39)
LDL Chol Calc (NIH): 36 mg/dL (ref 0–99)
Triglycerides: 36 mg/dL (ref 0–149)
VLDL Cholesterol Cal: 11 mg/dL (ref 5–40)

## 2023-08-24 LAB — T4, FREE: Free T4: 1.41 ng/dL (ref 0.82–1.77)

## 2023-08-24 LAB — TSH: TSH: 0.384 u[IU]/mL — ABNORMAL LOW (ref 0.450–4.500)

## 2023-08-25 ENCOUNTER — Ambulatory Visit (INDEPENDENT_AMBULATORY_CARE_PROVIDER_SITE_OTHER): Payer: Medicare Other

## 2023-08-25 ENCOUNTER — Encounter (INDEPENDENT_AMBULATORY_CARE_PROVIDER_SITE_OTHER): Payer: Self-pay

## 2023-08-25 VITALS — Ht 67.0 in | Wt 125.0 lb

## 2023-08-25 DIAGNOSIS — R040 Epistaxis: Secondary | ICD-10-CM

## 2023-08-26 ENCOUNTER — Other Ambulatory Visit: Payer: Self-pay | Admitting: Internal Medicine

## 2023-08-26 ENCOUNTER — Other Ambulatory Visit: Payer: Self-pay

## 2023-08-26 ENCOUNTER — Telehealth: Payer: Self-pay | Admitting: Internal Medicine

## 2023-08-26 DIAGNOSIS — R040 Epistaxis: Secondary | ICD-10-CM | POA: Insufficient documentation

## 2023-08-26 MED ORDER — LISINOPRIL 20 MG PO TABS: 20.0000 mg | ORAL_TABLET | Freq: Every day | ORAL | 1 refills | Status: DC

## 2023-08-26 NOTE — Progress Notes (Signed)
Patient ID: Chase Briggs, male   DOB: [birth year], 72 y.o.   MRN: [id number]  CC: Recurrent epistaxis  HPI:  Chase Briggs is a 72 y.o. male who presents today complaining of bilateral recurrent epistaxis for many years.  The bleeding is worse on the right side.  The severity and frequency of his nasal bleeding has increased over the past year.  He has had multiple severe right-sided bleeding this month.  His last episode was 1 week ago.  He was recently evaluated at the [facility] emergency room.  The patient is currently on Plavix and aspirin.  He has no previous nasal surgery or nasal trauma.  He is able to breathe through both nostrils.  Past Medical History:  Diagnosis Date   Diabetes mellitus    Fatty liver    GERD (gastroesophageal reflux disease)    HTN (hypertension)    Hyperlipidemia    Insomnia    Renal insufficiency     Past Surgical History:  Procedure Laterality Date   ABDOMINAL AORTOGRAM W/LOWER EXTREMITY N/A 08/22/2018   Procedure: ABDOMINAL AORTOGRAM W/LOWER EXTREMITY;  Surgeon: [name], MD;  Location: [facility] INVASIVE CV LAB;  Service: Cardiovascular;  Laterality: N/A;   BACK SURGERY     CARPAL TUNNEL WITH CUBITAL TUNNEL Right 03/21/2019   Procedure: RIGHT CARPAL TUNNEL AND CUBITAL  TUNNEL RELEASES;  Surgeon: [name], MD;  Location: [facility];  Service: Orthopedics;  Laterality: Right;  AXILLARY BLOCK   COLONOSCOPY  01/2007   Dr. [name], reviewed report, mild diverticulosis. Prep adequate. Next TCS due 01/2017   ESOPHAGOGASTRODUODENOSCOPY N/A 10/21/2012   [id number] GASTRITIS   MASS EXCISION Right 10/26/2012   Procedure: EXCISION MASS;  Surgeon: [name], MD;  Location: [facility];  Service: General;  Laterality: Right;   PERIPHERAL VASCULAR INTERVENTION  08/22/2018   Procedure: PERIPHERAL VASCULAR INTERVENTION;  Surgeon: [name], MD;  Location: [facility] INVASIVE CV LAB;  Service: Cardiovascular;;  bilateral  external iliac stents   scalp mass  4-5 yrs ago   Dr Angelique Holm NERVE TRANSPOSITION Right 03/21/2019   Procedure: DECOMPRESSION RIGHT ULNAR NERVE;  Surgeon: [name], MD;  Location: [facility];  Service: Orthopedics;  Laterality: Right;    Family History  Problem Relation Age of Onset   Diabetes Mother    Diabetes Father    Diabetes Brother    Colon cancer Neg Hx    Liver disease Neg Hx     Social History:  reports that he has been smoking cigarettes. He has a 17.5 pack-year smoking history. He has never used smokeless tobacco. He reports that he does not drink alcohol and does not use drugs.  Allergies: No Known Allergies  Prior to Admission medications   Medication Sig Start Date End Date Taking? Authorizing Provider  albuterol (VENTOLIN HFA) 108 (90 Base) MCG/ACT inhaler Inhale 2 puffs into the lungs every 6 (six) hours as needed for wheezing or shortness of breath. 04/06/23  Yes Anabel Halon, MD  amLODipine (NORVASC) 10 MG tablet Take 1 tablet (10 mg total) by mouth daily. 04/06/23  Yes Anabel Halon, MD  aspirin EC 81 MG tablet Take 1 tablet (81 mg total) by mouth daily with breakfast. 04/23/22  Yes Emokpae, Courage, MD  atorvastatin (LIPITOR) 10 MG tablet Take 1 tablet (10 mg total) by mouth daily. 08/18/23  Yes Anabel Halon, MD  Cholecalciferol (VITAMIN D) 125 MCG (5000 UT) CAPS Take 2,000 Units by  mouth daily. 04/23/22  Yes Emokpae, Courage, MD  cloNIDine (CATAPRES) 0.1 MG tablet Take 1 tablet (0.1 mg total) by mouth 2 (two) times daily. 04/06/23  Yes Anabel Halon, MD  clopidogrel (PLAVIX) 75 MG tablet TAKE (1) TABLET BY MOUTH ONCE DAILY. 04/06/23  Yes Anabel Halon, MD  EPINEPHrine 0.3 mg/0.3 mL IJ SOAJ injection Inject 0.3 mg into the muscle as needed for anaphylaxis. 02/10/23  Yes Gardenia Phlegm, MD  ferrous sulfate 325 (65 FE) MG EC tablet Take 325 mg by mouth daily with breakfast.   Yes [provider]  glipiZIDE (GLUCOTROL XL) 2.5 MG 24  hr tablet TAKE 1 TABLET(2.5 MG) BY MOUTH DAILY WITH BREAKFAST 07/20/23  Yes Nida, Denman George, MD  glucose blood test strip 1 each by Other route 2 (two) times daily. Use as instructed bid E11.65 Relion Premier 05/15/19  Yes Nida, Denman George, MD  insulin glargine (LANTUS SOLOSTAR) 100 UNIT/ML Solostar Pen Inject 10 Units into the skin daily. 06/14/23  Yes Johnson, Clanford L, MD  Lancets MISC 1 each by Does not apply route 2 (two) times daily. E11.65 Relion Premier 05/15/19  Yes Nida, Denman George, MD  mirtazapine (REMERON) 15 MG tablet Take 1 tablet (15 mg total) by mouth at bedtime. 06/23/23  Yes Anabel Halon, MD  mupirocin ointment (BACTROBAN) 2 % Apply 1 Application topically 2 (two) times daily. Over upper lip area. 05/25/23  Yes Anabel Halon, MD  SURE COMFORT PEN NEEDLES 32G X 4 MM MISC USE ONCE DAILY 10/30/22  Yes Nida, Denman George, MD  lisinopril (ZESTRIL) 20 MG tablet Take 1 tablet (20 mg total) by mouth daily. 08/26/23   Anabel Halon, MD    Height 5\' 7"  (1.702 m), weight 125 lb (56.7 kg). Exam: General: Communicates without difficulty, well nourished, no acute distress. Head: Normocephalic, no evidence injury, no tenderness, facial buttresses intact without stepoff. Face/sinus: No tenderness to palpation and percussion. Facial movement is normal and symmetric. Eyes: PERRL, EOMI. No scleral icterus, conjunctivae clear. Neuro: CN II exam reveals vision grossly intact.  No nystagmus at any point of gaze. Ears: Auricles well formed without lesions.  Ear canals are intact without mass or lesion.  No erythema or edema is appreciated.  The TMs are intact without fluid. Nose: External evaluation reveals normal support and skin without lesions.  Dorsum is intact.  Anterior rhinoscopy reveals congested mucosa over anterior aspect of inferior turbinates and intact septum.  Oral:  Oral cavity and oropharynx are intact, symmetric, without erythema or edema.  Mucosa is moist without  lesions. Neck: Full range of motion without pain.  There is no significant lymphadenopathy.  No masses palpable.  Thyroid bed within normal limits to palpation.  Parotid glands and submandibular glands equal bilaterally without mass.  Trachea is midline. Neuro:  CN 2-12 grossly intact.   Procedure:  Endoscopic control of recurrent right epistaxis. Indication:  Recurrent epistaxis  Description:  The right nasal cavity is sprayed with topical xylocaine and neo-synephrine.  After adequate anesthesia is achieved, the nasal cavity is examined with a 0 rigid endoscope.  A suction catheter is inserted into parallel with the 0 endoscope, and it is used to suction blood clots from the nasal cavity.  Several hypervascular areas are noted on the anterior and superior portion of the septum.  A silver nitrate stick is inserted in parallel with the 0 endoscope.  It is used to repeatedly cauterized the hypervascular areas.  Good hemostasis is achieved.  The patient tolerated the procedure well.    Assessment: 1.  Bilateral recurrent epistaxis, worse on the right side. 2.  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 3.  No suspicious mass or lesion is noted on today's nasal endoscopy examination.  Plan: 1.  The physical exam and nasal endoscopy findings are reviewed with the patient. 2.  Endoscopic cauterization of the right nasal septum. 3.  Humidifier/nasal ointment to treat any nasal dryness. 4.  The patient will return for reevaluation in 4 weeks.  Erica Osuna W Maricsa Sammons 08/26/2023, 12:28 PM

## 2023-08-26 NOTE — Telephone Encounter (Signed)
Prescription Request  08/26/2023  LOV: 07/29/2023  What is the name of the medication or equipment? lisinopril (ZESTRIL) 20 MG tablet [[id number]]    Have you contacted your pharmacy to request a refill? Yes   Which pharmacy would you like this sent to?    [id number] - [place], [place] - [address] [place] [address] Phone: [phone] Fax: [phone]    Patient notified that their request is being sent to the clinical staff for review and that they should receive a response within 2 business days.   Please advise at Mobile [phone] (mobile)

## 2023-08-26 NOTE — Telephone Encounter (Signed)
Refills sent to pharmacy. 

## 2023-08-30 ENCOUNTER — Encounter: Payer: Self-pay | Admitting: "Endocrinology

## 2023-08-30 ENCOUNTER — Ambulatory Visit (INDEPENDENT_AMBULATORY_CARE_PROVIDER_SITE_OTHER): Payer: Medicare Other | Admitting: "Endocrinology

## 2023-08-30 VITALS — BP 130/76 | HR 72 | Ht 67.0 in | Wt 124.2 lb

## 2023-08-30 DIAGNOSIS — E1169 Type 2 diabetes mellitus with other specified complication: Secondary | ICD-10-CM

## 2023-08-30 DIAGNOSIS — E782 Mixed hyperlipidemia: Secondary | ICD-10-CM | POA: Diagnosis not present

## 2023-08-30 DIAGNOSIS — Z794 Long term (current) use of insulin: Secondary | ICD-10-CM | POA: Diagnosis not present

## 2023-08-30 DIAGNOSIS — I10 Essential (primary) hypertension: Secondary | ICD-10-CM

## 2023-08-30 DIAGNOSIS — F17200 Nicotine dependence, unspecified, uncomplicated: Secondary | ICD-10-CM

## 2023-08-30 MED ORDER — LANTUS SOLOSTAR 100 UNIT/ML ~~LOC~~ SOPN: 12.0000 [IU] | PEN_INJECTOR | Freq: Every day | SUBCUTANEOUS | 0 refills | Status: DC

## 2023-08-30 NOTE — Progress Notes (Signed)
08/30/2023   Endocrinology follow-up note   Subjective:    Patient ID: Chase Briggs, male    DOB: Jun 11, 1951, PCP Anabel Halon, MD   Past Medical History:  Diagnosis Date   Diabetes mellitus    Fatty liver    GERD (gastroesophageal reflux disease)    HTN (hypertension)    Hyperlipidemia    Insomnia    Renal insufficiency    Past Surgical History:  Procedure Laterality Date   ABDOMINAL AORTOGRAM W/LOWER EXTREMITY N/A 08/22/2018   Procedure: ABDOMINAL AORTOGRAM W/LOWER EXTREMITY;  Surgeon: [name], MD;  Location: [facility] INVASIVE CV LAB;  Service: Cardiovascular;  Laterality: N/A;   BACK SURGERY     CARPAL TUNNEL WITH CUBITAL TUNNEL Right 03/21/2019   Procedure: RIGHT CARPAL TUNNEL AND CUBITAL  TUNNEL RELEASES;  Surgeon: [name], MD;  Location: [facility];  Service: Orthopedics;  Laterality: Right;  AXILLARY BLOCK   COLONOSCOPY  01/2007   Dr. [name], reviewed report, mild diverticulosis. Prep adequate. Next TCS due 01/2017   ESOPHAGOGASTRODUODENOSCOPY N/A 10/21/2012   [id number] GASTRITIS   MASS EXCISION Right 10/26/2012   Procedure: EXCISION MASS;  Surgeon: [name], MD;  Location: [facility];  Service: General;  Laterality: Right;   PERIPHERAL VASCULAR INTERVENTION  08/22/2018   Procedure: PERIPHERAL VASCULAR INTERVENTION;  Surgeon: [name], MD;  Location: [facility] INVASIVE CV LAB;  Service: Cardiovascular;;  bilateral external iliac stents   scalp mass  4-5 yrs ago   Dr Angelique Holm NERVE TRANSPOSITION Right 03/21/2019   Procedure: DECOMPRESSION RIGHT ULNAR NERVE;  Surgeon: [name], MD;  Location: [facility];  Service: Orthopedics;  Laterality: Right;   Social History   Socioeconomic History   Marital status: Married    Spouse name: Not on file   Number of children: 2   Years of education: Not on file   Highest education level: Not on file  Occupational History   Occupation: reitred     Comment: [employer], heavy lifting  Tobacco Use   Smoking status: Every Day    Current packs/day: 0.50    Average packs/day: 0.5 packs/day for 35.0 years (17.5 ttl pk-yrs)    Types: Cigarettes   Smokeless tobacco: Never  Vaping Use   Vaping status: Never Used  Substance and Sexual Activity   Alcohol use: No    Comment: hx heavy etoh x 3 yrs, quit 30+ yrs ago   Drug use: No   Sexual activity: Yes    Birth control/protection: None  Other Topics Concern   Not on file  Social History Narrative   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 Highest level of education: 12th grade. Married for 12 years. Has 2 sons (33 and 21). Lives with wife.   Social Drivers of Corporate investment banker Strain: Not on file  Food Insecurity: No Food Insecurity (06/09/2023)   Hunger Vital Sign    Worried About Running Out of Food in the Last Year: Never true    Ran Out of Food in the Last Year: Never true  Transportation Needs: No Transportation Needs (06/09/2023)   PRAPARE - Administrator, Civil Service (Medical): No    Lack of Transportation (Non-Medical): No  Physical Activity: Not on file  Stress: Not on file  Social Connections: Not on file   Outpatient Encounter Medications as of 08/30/2023  Medication Sig   albuterol (VENTOLIN HFA) 108 (90 Base) MCG/ACT inhaler Inhale 2 puffs into  the lungs every 6 (six) hours as needed for wheezing or shortness of breath.   amLODipine (NORVASC) 10 MG tablet Take 1 tablet (10 mg total) by mouth daily.   aspirin EC 81 MG tablet Take 1 tablet (81 mg total) by mouth daily with breakfast.   atorvastatin (LIPITOR) 10 MG tablet Take 1 tablet (10 mg total) by mouth daily.   Cholecalciferol (VITAMIN D) 125 MCG (5000 UT) CAPS Take 2,000 Units by mouth daily.   cloNIDine (CATAPRES) 0.1 MG tablet Take 1 tablet (0.1 mg total) by mouth 2 (two) times daily.   clopidogrel (PLAVIX) 75 MG tablet TAKE (1) TABLET BY MOUTH ONCE DAILY.   EPINEPHrine 0.3 mg/0.3 mL IJ SOAJ  injection Inject 0.3 mg into the muscle as needed for anaphylaxis.   ferrous sulfate 325 (65 FE) MG EC tablet Take 325 mg by mouth daily with breakfast.   glipiZIDE (GLUCOTROL XL) 2.5 MG 24 hr tablet TAKE 1 TABLET(2.5 MG) BY MOUTH DAILY WITH BREAKFAST   glucose blood test strip 1 each by Other route 2 (two) times daily. Use as instructed bid E11.65 Relion Premier   insulin glargine (LANTUS SOLOSTAR) 100 UNIT/ML Solostar Pen Inject 12 Units into the skin daily after breakfast.   Lancets MISC 1 each by Does not apply route 2 (two) times daily. E11.65 Relion Premier   lisinopril (ZESTRIL) 20 MG tablet Take 1 tablet (20 mg total) by mouth daily.   mirtazapine (REMERON) 15 MG tablet Take 1 tablet (15 mg total) by mouth at bedtime.   mupirocin ointment (BACTROBAN) 2 % Apply 1 Application topically 2 (two) times daily. Over upper lip area.   SURE COMFORT PEN NEEDLES 32G X 4 MM MISC USE ONCE DAILY   [DISCONTINUED] insulin glargine (LANTUS SOLOSTAR) 100 UNIT/ML Solostar Pen Inject 10 Units into the skin daily. (Patient taking differently: Inject 12 Units into the skin daily.)   No facility-administered encounter medications on file as of 08/30/2023.   ALLERGIES: No Known Allergies VACCINATION STATUS: Immunization History  Administered Date(s) Administered   Fluad Quad(high Dose 65+) 08/27/2021, 07/24/2022   Fluad Trivalent(High Dose 65+) 06/08/2023   Moderna Sars-Covid-2 Vaccination 01/04/2020, 02/06/2020   PNEUMOCOCCAL CONJUGATE-20 07/24/2022    Diabetes He presents for his follow-up diabetic visit. He has type 2 diabetes mellitus. Onset time: He was diagnosed at approximate age of 35 years. His disease course has been improving. There are no hypoglycemic associated symptoms. Pertinent negatives for hypoglycemia include no confusion, headaches, pallor or seizures. Pertinent negatives for diabetes include no chest pain, no fatigue, no polydipsia, no polyphagia, no polyuria and no weakness. There are  no hypoglycemic complications. Symptoms are improving (He recently lost control of his glycemia due to exposure to heavy dose steroids.). Risk factors for coronary artery disease include diabetes mellitus, dyslipidemia, hypertension, male sex, sedentary lifestyle and tobacco exposure. He is compliant with treatment most of the time. His weight is fluctuating minimally. He is following a generally unhealthy diet. When asked about meal planning, he reported none. He has not had a previous visit with a dietitian. His home blood glucose trend is fluctuating minimally. His breakfast blood glucose range is generally 90-110 mg/dl. His bedtime blood glucose range is generally 110-130 mg/dl. His overall blood glucose range is 110-130 mg/dl. Chase Briggs presents with controlled glycemic profile both fasting and postprandial.  His previsit A1c was 6.3%.    He did not document major hypoglycemia.     ) An ACE inhibitor/angiotensin II receptor blocker is being taken. Eye exam  is current.  Hypertension This is a chronic problem. The current episode started more than 1 year ago. The problem has been gradually improving since onset. The problem is uncontrolled. Pertinent negatives include no chest pain, headaches, neck pain, palpitations or shortness of breath. Risk factors for coronary artery disease include smoking/tobacco exposure, sedentary lifestyle, dyslipidemia, family history and diabetes mellitus. Past treatments include ACE inhibitors, calcium channel blockers and central alpha agonists. The current treatment provides no improvement. Compliance problems include psychosocial issues.  Hypertensive end-organ damage includes kidney disease. Identifiable causes of hypertension include chronic renal disease.  Hyperlipidemia This is a chronic problem. The current episode started more than 1 year ago. Exacerbating diseases include chronic renal disease and diabetes. Pertinent negatives include no chest pain, myalgias or  shortness of breath. Current antihyperlipidemic treatment includes statins. Compliance problems include psychosocial issues.  Risk factors for coronary artery disease include dyslipidemia, diabetes mellitus, male sex, hypertension and a sedentary lifestyle.     Review of Systems  Constitutional:  Negative for fatigue and unexpected weight change.  HENT:  Negative for dental problem, mouth sores and trouble swallowing.   Eyes:  Negative for visual disturbance.  Respiratory:  Negative for cough, choking, chest tightness, shortness of breath and wheezing.   Cardiovascular:  Negative for chest pain, palpitations and leg swelling.  Gastrointestinal:  Negative for abdominal distention, abdominal pain, constipation, diarrhea, nausea and vomiting.  Endocrine: Negative for polydipsia, polyphagia and polyuria.  Genitourinary:  Negative for dysuria, flank pain, hematuria and urgency.  Musculoskeletal:  Negative for back pain, gait problem, myalgias and neck pain.  Skin:  Negative for pallor, rash and wound.  Neurological:  Negative for seizures, syncope, weakness, numbness and headaches.  Psychiatric/Behavioral: Negative.  Negative for confusion and dysphoric mood.     Objective:    BP 130/76   Pulse 72   Ht 5\' 7"  (1.702 m)   Wt 124 lb 3.2 oz (56.3 kg)   BMI 19.45 kg/m   Wt Readings from Last 3 Encounters:  08/30/23 124 lb 3.2 oz (56.3 kg)  08/25/23 125 lb (56.7 kg)  07/29/23 125 lb (56.7 kg)     Physical Exam- Limited  Constitutional:  Body mass index is 19.45 kg/m. , not in acute distress, normal state of mind  Reluctant affect, not ready to quit smoking  Complete Blood Count (Most recent): Lab Results  Component Value Date   WBC 9.6 07/29/2023   HGB 13.0 07/29/2023   HCT 40.1 07/29/2023   MCV 97.1 07/29/2023   PLT 290 07/29/2023   Chemistry (most recent): Lab Results  Component Value Date   NA 139 08/23/2023   K 4.4 08/23/2023   CL 100 08/23/2023   CO2 25 08/23/2023    BUN 8 08/23/2023   CREATININE 1.09 08/23/2023   Diabetic Labs (most recent): Lab Results  Component Value Date   HGBA1C 6.3 (H) 06/09/2023   HGBA1C 6.6 02/25/2023   HGBA1C 6.1 08/26/2022   MICROALBUR 150 08/23/2020   MICROALBUR 9.9 08/20/2017   Lipid Panel     Component Value Date/Time   CHOL 87 (L) 08/23/2023 0805   TRIG 36 08/23/2023 0805   HDL 40 08/23/2023 0805   CHOLHDL 2.2 08/23/2023 0805   CHOLHDL 2.1 04/23/2022 0142   VLDL 4 04/23/2022 0142   LDLCALC 36 08/23/2023 0805   LDLCALC 56 12/12/2019 0905     Assessment & Plan:   1. Type 2 diabetes mellitus with microalbuminuria , with long-term current use of insulin (HCC)  Chase Briggs presents with controlled glycemic profile both fasting and postprandial.  His previsit A1c was 6.3%.    He did not document major hypoglycemia.   He does not have any hypoglycemic episodes.  He is a chronic heavy smoker, remains at a high risk for more acute and chronic complications of diabetes which include CAD, CVA, CKD, retinopathy, and neuropathy. These are all discussed in detail with the patient.    Recent labs reviewed, stage 3 renal insufficiency. - I have re-counseled the patient on diet management  by adopting a carbohydrate restricted / protein rich  Diet.  - he acknowledges that there is a room for improvement in his food and drink choices. - Suggestion is made for him to avoid simple carbohydrates  from his diet including Cakes, Sweet Desserts, Ice Cream, Soda (diet and regular), Sweet Tea, Candies, Chips, Cookies, Store Bought Juices, Alcohol in Excess of  1-2 drinks a day, Artificial Sweeteners,  Coffee Creamer, and "Sugar-free" Products, Lemonade. This will help patient to have more stable blood glucose profile and potentially avoid unintended weight gain.   - Patient is advised to stick to a routine mealtimes to eat 3 meals  a day and avoid unnecessary snacks ( to snack only to correct hypoglycemia).  - I have approached  patient with the following individualized plan to manage diabetes and patient agrees.  - He has benefited from insulin initiation.   - Based on  his presentation with controlled glycemic profile, he will not need prandial insulin for now.  He is advised to continue Lantus 12 units daily with breakfast.    He is encouraged and agrees to to continue monitoring blood glucose at least twice a day-daily before breakfast and at bedtime.    - He will call clinic if he registers blood glucose readings less than 70 or greater than 200 mg/dL.  -   He has stage 3 renal insufficiency which has improved over time, will stay off of metformin for now.   -He is benefiting from low-dose glipizide.  I advised him to continue glipizide 2.5 mg p.o daily with breakfast.    Sufficiency continues, he will need consultation with nephrology.    - Patient is not a candidate for  incretin therapy, nor SGLT2 inhibitors. - Patient specific target  for A1c; LDL, HDL, Triglycerides, were discussed in detail.  2) BP/HTN:  -His blood pressure is controlled to target. He has 3 medications including losartan, clonidine, and amlodipine.  He continues to smoke heavily.   The patient was counseled on the dangers of tobacco use, and was advised to quit.  Reviewed strategies to maximize success, including removing cigarettes and smoking materials from environment.   3) Lipids/HPL: His most recent lipid panel showed LDL controlled at 36.  He is not on statins for now.     4)  Weight/Diet: His BMI is 19.45-he is not a candidate for weight loss.    - He would benefit from addition of more complex carbohydrates, information provided. CDE consult is requested.  5. Vitamin D deficiency  - He he is on ongoing supplement with vitamin D3 5000 units daily.    6) Chronic Care/Health Maintenance:  -Patient is on ACEI/ARB and Statin medications and encouraged to continue to follow up with Ophthalmology, Podiatrist at least yearly  or according to recommendations, and advised to  Quit  smoking. I have recommended yearly flu vaccine and pneumonia vaccination at least every 5 years; moderate intensity exercise for up to 150 minutes  weekly; and  sleep for at least 7 hours a day. -He is counseled extensively for smoking cessation. The patient was counseled on the dangers of tobacco use, and was advised to quit.  Reviewed strategies to maximize success, including removing cigarettes and smoking materials from environment.   - I advised patient to maintain close follow up with Anabel Halon, MD for primary care needs. - I have advised him to avoid over-the-counter pain medications to avoid additional risk for renal insufficiency.   I spent  26  minutes in the care of the patient today including review of labs from CMP, Lipids, Thyroid Function, Hematology (current and previous including abstractions from other facilities); face-to-face time discussing  his blood glucose readings/logs, discussing hypoglycemia and hyperglycemia episodes and symptoms, medications doses, his options of short and long term treatment based on the latest standards of care / guidelines;  discussion about incorporating lifestyle medicine;  and documenting the encounter. Risk reduction counseling performed per USPSTF guidelines to reduce cardiovascular risk factors.     Please refer to Patient Instructions for Blood Glucose Monitoring and Insulin/Medications Dosing Guide"  in media tab for additional information. Please  also refer to " Patient Self Inventory" in the Media  tab for reviewed elements of pertinent patient history.  Chase Briggs participated in the discussions, expressed understanding, and voiced agreement with the above plans.  All questions were answered to his satisfaction. he is encouraged to contact clinic should he have any questions or concerns prior to his return visit.    Follow up plan: -Return in about 6 months (around  02/28/2024) for A1c -[place].  Marquis Lunch, MD Phone: [phone]  Fax: [phone]  This note was partially dictated with voice recognition software. Similar sounding words can be transcribed inadequately or may not  be corrected upon review.  08/30/2023, 9:37 AM

## 2023-09-19 ENCOUNTER — Other Ambulatory Visit: Payer: Self-pay | Admitting: Cardiology

## 2023-09-27 ENCOUNTER — Telehealth (INDEPENDENT_AMBULATORY_CARE_PROVIDER_SITE_OTHER): Payer: Self-pay | Admitting: Otolaryngology

## 2023-09-27 DIAGNOSIS — E113291 Type 2 diabetes mellitus with mild nonproliferative diabetic retinopathy without macular edema, right eye: Secondary | ICD-10-CM | POA: Diagnosis not present

## 2023-09-27 NOTE — Telephone Encounter (Signed)
 confirmed appt & location [id number] afm

## 2023-09-28 ENCOUNTER — Ambulatory Visit (INDEPENDENT_AMBULATORY_CARE_PROVIDER_SITE_OTHER): Payer: Medicare Other | Admitting: Otolaryngology

## 2023-09-28 ENCOUNTER — Encounter (INDEPENDENT_AMBULATORY_CARE_PROVIDER_SITE_OTHER): Payer: Self-pay

## 2023-09-28 VITALS — BP 162/86 | HR 71 | Ht 67.0 in | Wt 128.0 lb

## 2023-09-28 DIAGNOSIS — R040 Epistaxis: Secondary | ICD-10-CM

## 2023-09-29 NOTE — Progress Notes (Signed)
 Patient ID: Chase Briggs, male   DOB: April 28, 1951, 73 y.o.   MRN: [id number]  Follow-up: Recurrent epistaxis  HPI: The patient is a 73 year old male who returns today for his follow-up evaluation.  The patient was previously seen for recurrent epistaxis.  The bleeding was worse on the right side.  At his last visit 1 month ago, multiple hypervascular areas were noted on the right anterior and superior nasal septum.  He underwent cauterization of the right nasal septum without difficulty.  The patient returns today reporting significant improvement in his nasal bleeding.  He has had only 1 episode of minor right-sided bleeding last month.  He is able to breathe through both nostrils.  He denies any recent nasal trauma.  Exam: General: Communicates without difficulty, well nourished, no acute distress. Head: Normocephalic, no evidence injury, no tenderness, facial buttresses intact without stepoff. Face/sinus: No tenderness to palpation and percussion. Facial movement is normal and symmetric. Eyes: PERRL, EOMI. No scleral icterus, conjunctivae clear. Neuro: CN II exam reveals vision grossly intact.  No nystagmus at any point of gaze. Ears: Auricles well formed without lesions.  Ear canals are intact without mass or lesion.  No erythema or edema is appreciated.  The TMs are intact without fluid. Nose: External evaluation reveals normal support and skin without lesions.  Dorsum is intact.  Anterior rhinoscopy reveals congested mucosa over anterior aspect of inferior turbinates and intact septum.  No bleeding noted. Oral:  Oral cavity and oropharynx are intact, symmetric, without erythema or edema.  Mucosa is moist without lesions. Neck: Full range of motion without pain.  There is no significant lymphadenopathy.  No masses palpable.  Thyroid  bed within normal limits to palpation.  Parotid glands and submandibular glands equal bilaterally without mass.  Trachea is midline. Neuro:  CN 2-12 grossly intact.    Assessment: 1.  The patient's recurrent epistaxis has improved after the endoscopic cauterization procedure. 2.  No significant hypervascular area or bleeding is noted today.  Plan: 1.  The physical exam findings are reviewed with the patient. 2.  Continue the use of humidifier/nasal ointment as needed to treat any nasal sinus. 3.  The patient is encouraged to call with any questions or concerns.

## 2023-10-11 DIAGNOSIS — B351 Tinea unguium: Secondary | ICD-10-CM | POA: Diagnosis not present

## 2023-10-11 DIAGNOSIS — L851 Acquired keratosis [keratoderma] palmaris et plantaris: Secondary | ICD-10-CM | POA: Diagnosis not present

## 2023-10-11 DIAGNOSIS — E1142 Type 2 diabetes mellitus with diabetic polyneuropathy: Secondary | ICD-10-CM | POA: Diagnosis not present

## 2023-10-11 DIAGNOSIS — E1151 Type 2 diabetes mellitus with diabetic peripheral angiopathy without gangrene: Secondary | ICD-10-CM | POA: Diagnosis not present

## 2023-10-14 ENCOUNTER — Other Ambulatory Visit: Payer: Self-pay | Admitting: Internal Medicine

## 2023-10-14 DIAGNOSIS — G4700 Insomnia, unspecified: Secondary | ICD-10-CM

## 2023-10-16 ENCOUNTER — Other Ambulatory Visit: Payer: Self-pay | Admitting: "Endocrinology

## 2023-10-20 ENCOUNTER — Other Ambulatory Visit: Payer: Self-pay | Admitting: Internal Medicine

## 2023-10-25 ENCOUNTER — Other Ambulatory Visit: Payer: Self-pay | Admitting: "Endocrinology

## 2023-10-29 ENCOUNTER — Ambulatory Visit (INDEPENDENT_AMBULATORY_CARE_PROVIDER_SITE_OTHER): Payer: Medicare Other | Admitting: Internal Medicine

## 2023-10-29 ENCOUNTER — Encounter: Payer: Self-pay | Admitting: Internal Medicine

## 2023-10-29 VITALS — BP 154/62 | HR 93 | Ht 67.0 in | Wt 129.0 lb

## 2023-10-29 DIAGNOSIS — I10 Essential (primary) hypertension: Secondary | ICD-10-CM

## 2023-10-29 DIAGNOSIS — I739 Peripheral vascular disease, unspecified: Secondary | ICD-10-CM

## 2023-10-29 DIAGNOSIS — J432 Centrilobular emphysema: Secondary | ICD-10-CM | POA: Diagnosis not present

## 2023-10-29 DIAGNOSIS — E1169 Type 2 diabetes mellitus with other specified complication: Secondary | ICD-10-CM

## 2023-10-29 DIAGNOSIS — Z794 Long term (current) use of insulin: Secondary | ICD-10-CM

## 2023-10-29 DIAGNOSIS — E441 Mild protein-calorie malnutrition: Secondary | ICD-10-CM | POA: Diagnosis not present

## 2023-10-29 MED ORDER — LISINOPRIL 40 MG PO TABS
40.0000 mg | ORAL_TABLET | Freq: Every day | ORAL | 1 refills | Status: DC
Start: 2023-10-29 — End: 2024-04-24

## 2023-10-29 MED ORDER — ALBUTEROL SULFATE HFA 108 (90 BASE) MCG/ACT IN AERS: 2.0000 | INHALATION_SPRAY | Freq: Four times a day (QID) | RESPIRATORY_TRACT | 0 refills | Status: DC | PRN

## 2023-10-29 NOTE — Progress Notes (Signed)
 Established Patient Office Visit  Subjective:  Patient ID: Chase Briggs, male    DOB: 22-Mar-1951  Age: 73 y.o. MRN: [id number]  CC:  Chief Complaint  Patient presents with   Care Management    3 month f/u    HPI [name] is a 73 y.o. male with past medical history of HTN, PAD, allergic rhinitis, type II DM, HLD, insomnia, anemia and tobacco abuse who presents for f/u of his chronic medical conditions.  HTN: His blood pressure was elevated. 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  His hyponatremia has resolved now.  His kidney function test has also improved.  He has noticed improvement in fatigue and dizziness.  Denies any chest pain, dyspnea or palpitations currently.  He has been taking Remeron for improving appetite and sleep quality.  He still has not started protein supplement.  His appetite has improved, but he reports that he is picky eater. He has gained about 7 lbs since 11/24.  Prostatic mass: He had biopsy of prostate in 11/24, which was benign.  He denies any dysuria, hematuria, urinary hesitancy or resistance.  PAD: Has had right and left iliac stent placement, followed by vascular surgery.  He takes aspirin, Plavix and statin.  Denies any recent worsening of claudication symptoms.   Type II DM with HLD: He takes Lantus 12 units in the morning and glipizide 2.5 mg QD, followed by Dr. Fransico Him.  Denies any fatigue, polyuria or polydipsia.  He takes Lipitor for HLD. Denies any fever, chills, chronic cough, hemoptysis, or night sweats.    Past Medical History:  Diagnosis Date   Diabetes mellitus    Fatty liver    GERD (gastroesophageal reflux disease)    HTN (hypertension)    Hyperlipidemia    Insomnia    Renal insufficiency     Past Surgical History:  Procedure Laterality Date   ABDOMINAL AORTOGRAM W/LOWER EXTREMITY N/A 08/22/2018   Procedure: ABDOMINAL AORTOGRAM  W/LOWER EXTREMITY;  Surgeon: [name], MD;  Location: [facility] INVASIVE CV LAB;  Service: Cardiovascular;  Laterality: N/A;   BACK SURGERY     CARPAL TUNNEL WITH CUBITAL TUNNEL Right 03/21/2019   Procedure: RIGHT CARPAL TUNNEL AND CUBITAL  TUNNEL RELEASES;  Surgeon: [name], MD;  Location: [facility];  Service: Orthopedics;  Laterality: Right;  AXILLARY BLOCK   COLONOSCOPY  01/2007   Dr. [name], reviewed report, mild diverticulosis. Prep adequate. Next TCS due 01/2017   ESOPHAGOGASTRODUODENOSCOPY N/A 10/21/2012   [id number] GASTRITIS   MASS EXCISION Right 10/26/2012   Procedure: EXCISION MASS;  Surgeon: [name], MD;  Location: [facility];  Service: General;  Laterality: Right;   PERIPHERAL VASCULAR INTERVENTION  08/22/2018   Procedure: PERIPHERAL VASCULAR INTERVENTION;  Surgeon: [name], MD;  Location: [facility] INVASIVE CV LAB;  Service: Cardiovascular;;  bilateral external iliac stents   scalp mass  4-5 yrs ago   Dr Angelique Holm NERVE TRANSPOSITION Right 03/21/2019   Procedure: DECOMPRESSION RIGHT ULNAR NERVE;  Surgeon: [name], MD;  Location: [facility];  Service: Orthopedics;  Laterality: Right;    Family History  Problem Relation Age of Onset   Diabetes Mother    Diabetes Father    Diabetes Brother    Colon cancer Neg Hx    Liver disease Neg Hx     Social History   Socioeconomic History  Marital status: Married    Spouse name: Not on file   Number of children: 2   Years of education: Not on file   Highest education level: Not on file  Occupational History   Occupation: reitred    Comment: [employer], heavy lifting  Tobacco Use   Smoking status: Every Day    Current packs/day: 0.50    Average packs/day: 0.5 packs/day for 35.0 years (17.5 ttl pk-yrs)    Types: Cigarettes   Smokeless tobacco: Never  Vaping Use   Vaping status: Never Used  Substance and Sexual Activity   Alcohol use: No    Comment: hx heavy etoh x  3 yrs, quit 30+ yrs ago   Drug use: No   Sexual activity: Yes    Birth control/protection: None  Other Topics Concern   Not on file  Social History Narrative   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 Highest level of education: 12th grade. Married for 12 years. Has 2 sons (33 and 21). Lives with wife.   Social Drivers of Corporate investment banker Strain: Not on file  Food Insecurity: No Food Insecurity (06/09/2023)   Hunger Vital Sign    Worried About Running Out of Food in the Last Year: Never true    Ran Out of Food in the Last Year: Never true  Transportation Needs: No Transportation Needs (06/09/2023)   PRAPARE - Administrator, Civil Service (Medical): No    Lack of Transportation (Non-Medical): No  Physical Activity: Not on file  Stress: Not on file  Social Connections: Not on file  Intimate Partner Violence: Not At Risk (06/09/2023)   Humiliation, Afraid, Rape, and Kick questionnaire    Fear of Current or Ex-Partner: No    Emotionally Abused: No    Physically Abused: No    Sexually Abused: No    Outpatient Medications Prior to Visit  Medication Sig Dispense Refill   amLODipine (NORVASC) 10 MG tablet TAKE 1 TABLET BY MOUTH DAILY 90 tablet 3   aspirin EC 81 MG tablet Take 1 tablet (81 mg total) by mouth daily with breakfast. 30 tablet 11   atorvastatin (LIPITOR) 10 MG tablet Take 1 tablet (10 mg total) by mouth daily. 90 tablet 1   Cholecalciferol (VITAMIN D) 125 MCG (5000 UT) CAPS Take 2,000 Units by mouth daily. 30 capsule 3   cloNIDine (CATAPRES) 0.1 MG tablet Take 1 tablet (0.1 mg total) by mouth 2 (two) times daily. 180 tablet 3   clopidogrel (PLAVIX) 75 MG tablet TAKE (1) TABLET BY MOUTH ONCE DAILY. 90 tablet 3   EPINEPHrine 0.3 mg/0.3 mL IJ SOAJ injection Inject 0.3 mg into the muscle as needed for anaphylaxis. 1 each 1   ferrous sulfate 325 (65 FE) MG EC tablet Take 325 mg by mouth daily with breakfast.     glipiZIDE (GLUCOTROL XL) 2.5 MG 24 hr tablet  TAKE 1 TABLET(2.5 MG) BY MOUTH DAILY WITH BREAKFAST 90 tablet 0   glucose blood test strip 1 each by Other route 2 (two) times daily. Use as instructed bid E11.65 Relion Premier 100 each 5   insulin glargine (LANTUS SOLOSTAR) 100 UNIT/ML Solostar Pen Inject 12 Units into the skin daily after breakfast. 15 mL 0   Lancets MISC 1 each by Does not apply route 2 (two) times daily. E11.65 Relion Premier 100 each 5   mirtazapine (REMERON) 15 MG tablet TAKE 1 TABLET(15 MG) BY MOUTH AT BEDTIME 30 tablet 3   mupirocin ointment (  BACTROBAN) 2 % Apply 1 Application topically 2 (two) times daily. Over upper lip area. 22 g 0   SURE COMFORT PEN NEEDLES 32G X 4 MM MISC USE ONCE DAILY 100 each 3   albuterol (VENTOLIN HFA) 108 (90 Base) MCG/ACT inhaler Inhale 2 puffs into the lungs every 6 (six) hours as needed for wheezing or shortness of breath. 20.1 g 0   lisinopril (ZESTRIL) 20 MG tablet TAKE 1 TABLET(20 MG) BY MOUTH DAILY 30 tablet 1   No facility-administered medications prior to visit.    No Known Allergies  ROS Review of Systems  Constitutional:  Positive for fatigue. Negative for chills and fever.  HENT:  Negative for congestion, postnasal drip and sore throat.   Respiratory:  Negative for cough and shortness of breath.   Cardiovascular:  Negative for chest pain and palpitations.  Gastrointestinal:  Positive for constipation. Negative for diarrhea and vomiting.  Genitourinary:  Negative for dysuria and hematuria.       Right scrotal pain  Musculoskeletal:  Positive for back pain. Negative for neck pain and neck stiffness.  Skin:  Negative for rash.  Neurological:  Negative for dizziness and weakness.  Psychiatric/Behavioral:  Positive for sleep disturbance. Negative for agitation and behavioral problems.       Objective:    Physical Exam Vitals reviewed.  Constitutional:      General: He is not in acute distress.    Appearance: He is not diaphoretic.  HENT:     Head: Normocephalic and  atraumatic.     Nose: No congestion.     Mouth/Throat:     Mouth: Mucous membranes are moist.  Eyes:     General: No scleral icterus.    Extraocular Movements: Extraocular movements intact.  Cardiovascular:     Rate and Rhythm: Normal rate and regular rhythm.     Heart sounds: Normal heart sounds. No murmur heard. Pulmonary:     Breath sounds: Normal breath sounds. No wheezing or rales.  Genitourinary:    Testes:        Right: Tenderness (Mild) present.  Musculoskeletal:     Cervical back: Neck supple. No tenderness.     Right lower leg: No edema.     Left lower leg: No edema.  Skin:    General: Skin is warm.     Findings: No rash.     Comments: Clubbing of fingernails  Neurological:     General: No focal deficit present.     Mental Status: He is alert and oriented to person, place, and time.  Psychiatric:        Mood and Affect: Mood normal.        Behavior: Behavior normal.     BP (!) 154/62 (BP Location: Left Arm)   Pulse 93   Ht 5\' 7"  (1.702 m)   Wt 129 lb (58.5 kg)   SpO2 92%   BMI 20.20 kg/m  Wt Readings from Last 3 Encounters:  10/29/23 129 lb (58.5 kg)  09/28/23 128 lb (58.1 kg)  08/30/23 124 lb 3.2 oz (56.3 kg)    Lab Results  Component Value Date   TSH 0.384 (L) 08/23/2023   Lab Results  Component Value Date   WBC 9.6 07/29/2023   HGB 13.0 07/29/2023   HCT 40.1 07/29/2023   MCV 97.1 07/29/2023   PLT 290 07/29/2023   Lab Results  Component Value Date   NA 139 08/23/2023   K 4.4 08/23/2023   CO2 25 08/23/2023  GLUCOSE 165 (H) 08/23/2023   BUN 8 08/23/2023   CREATININE 1.09 08/23/2023   BILITOT 0.3 08/23/2023   ALKPHOS 136 (H) 08/23/2023   AST 15 08/23/2023   ALT 10 08/23/2023   PROT 7.6 08/23/2023   ALBUMIN 3.9 08/23/2023   CALCIUM 9.5 08/23/2023   ANIONGAP 11 07/29/2023   EGFR 72 08/23/2023   Lab Results  Component Value Date   CHOL 87 (L) 08/23/2023   Lab Results  Component Value Date   HDL 40 08/23/2023   Lab Results   Component Value Date   LDLCALC 36 08/23/2023   Lab Results  Component Value Date   TRIG 36 08/23/2023   Lab Results  Component Value Date   CHOLHDL 2.2 08/23/2023   Lab Results  Component Value Date   HGBA1C 6.3 (H) 06/09/2023      Assessment & Plan:   Problem List Items Addressed This Visit       Cardiovascular and Mediastinum   Essential hypertension - Primary   BP Readings from Last 1 Encounters:  10/29/23 (!) 154/62   Uncontrolled with amlodipine 10 mg QD, lisinopril 20 mg QD and clonidine 0.1 mg BID  Had decreased lisinopril from 40 mg to 20 mg due to hyponatremia - due to improvement in hyponatremia and kidney function with hydration, increase dose of lisinopril to 40 mg QD Followed by cardiology Counseled for compliance with the medications Advised DASH diet and moderate exercise/walking, at least 150 mins/week      Relevant Medications   lisinopril (ZESTRIL) 40 MG tablet   PAD (peripheral artery disease) (HCC)   S/p right and left iliac stent placement Followed by vascular surgery and cardiology On aspirin, Plavix and statin      Relevant Medications   lisinopril (ZESTRIL) 40 MG tablet     Respiratory   Centrilobular emphysema (HCC)   Overall well-controlled, has mild dyspnea on exertion On Albuterol PRN for dyspnea or wheezing      Relevant Medications   albuterol (VENTOLIN HFA) 108 (90 Base) MCG/ACT inhaler     Endocrine   Type 2 diabetes mellitus with other specified complication (HCC)   Lab Results  Component Value Date   HGBA1C 6.3 (H) 06/09/2023   Associated with HTN and PAD Well-controlled with Lantus 10 units every morning and Glipizide 2.5 mg QD, followed by Dr. Fransico Him Advised to follow diabetic diet On statin and ACEi F/u CMP and lipid panel Diabetic eye exam: Advised to follow up with Ophthalmology for diabetic eye exam      Relevant Medications   lisinopril (ZESTRIL) 40 MG tablet   Other Relevant Orders   Microalbumin /  creatinine urine ratio     Other   Mild protein-calorie malnutrition (HCC)   BMI Readings from Last 3 Encounters:  10/29/23 20.20 kg/m  09/28/23 20.05 kg/m  08/30/23 19.45 kg/m   Had weight loss and decreased appetite -now improving Had evaluation of prostate mass - bengin On Remeron 15 mg at bedtime now Advised to take protein supplement such as Glucerna       Meds ordered this encounter  Medications   albuterol (VENTOLIN HFA) 108 (90 Base) MCG/ACT inhaler    Sig: Inhale 2 puffs into the lungs every 6 (six) hours as needed for wheezing or shortness of breath.    Dispense:  18 g    Refill:  0    Okay to dispense generic alternative based on insurance preference.   lisinopril (ZESTRIL) 40 MG tablet    Sig:  Take 1 tablet (40 mg total) by mouth daily.    Dispense:  90 tablet    Refill:  1    Follow-up: Return in about 4 months (around 02/26/2024) for HTN.    Anabel Halon, MD

## 2023-10-29 NOTE — Assessment & Plan Note (Signed)
BMI Readings from Last 3 Encounters:  10/29/23 20.20 kg/m  09/28/23 20.05 kg/m  08/30/23 19.45 kg/m   Had weight loss and decreased appetite -now improving Had evaluation of prostate mass - bengin On Remeron 15 mg at bedtime now Advised to take protein supplement such as Glucerna

## 2023-10-29 NOTE — Assessment & Plan Note (Addendum)
Overall well-controlled, has mild dyspnea on exertion On Albuterol PRN for dyspnea or wheezing

## 2023-10-29 NOTE — Assessment & Plan Note (Addendum)
Lab Results  Component Value Date   HGBA1C 6.3 (H) 06/09/2023   Associated with HTN and PAD Well-controlled with Lantus 10 units every morning and Glipizide 2.5 mg QD, followed by Dr. Fransico Him Advised to follow diabetic diet On statin and ACEi F/u CMP and lipid panel Diabetic eye exam: Advised to follow up with Ophthalmology for diabetic eye exam

## 2023-10-29 NOTE — Assessment & Plan Note (Signed)
S/p right and left iliac stent placement Followed by vascular surgery and cardiology On aspirin, Plavix and statin

## 2023-10-29 NOTE — Patient Instructions (Addendum)
Please schedule annual wellness visit.  Please start taking Lisinopril 40 mg once daily.  Please continue to take medications as prescribed.  Please continue to follow low carb diet and perform moderate exercise/walking as tolerated.

## 2023-10-29 NOTE — Assessment & Plan Note (Addendum)
BP Readings from Last 1 Encounters:  10/29/23 (!) 154/62   Uncontrolled with amlodipine 10 mg QD, lisinopril 20 mg QD and clonidine 0.1 mg BID  Had decreased lisinopril from 40 mg to 20 mg due to hyponatremia - due to improvement in hyponatremia and kidney function with hydration, increase dose of lisinopril to 40 mg QD Followed by cardiology Counseled for compliance with the medications Advised DASH diet and moderate exercise/walking, at least 150 mins/week

## 2023-10-31 LAB — MICROALBUMIN / CREATININE URINE RATIO
Creatinine, Urine: 112.1 mg/dL
Microalb/Creat Ratio: 70 mg/g{creat} — ABNORMAL HIGH (ref 0–29)
Microalbumin, Urine: 78.6 ug/mL

## 2023-11-01 LAB — HM DIABETES EYE EXAM

## 2023-11-22 ENCOUNTER — Other Ambulatory Visit: Payer: Self-pay | Admitting: Internal Medicine

## 2023-11-22 DIAGNOSIS — J432 Centrilobular emphysema: Secondary | ICD-10-CM

## 2023-12-08 ENCOUNTER — Other Ambulatory Visit: Payer: Self-pay

## 2023-12-08 DIAGNOSIS — I739 Peripheral vascular disease, unspecified: Secondary | ICD-10-CM

## 2023-12-08 DIAGNOSIS — I771 Stricture of artery: Secondary | ICD-10-CM

## 2023-12-21 ENCOUNTER — Ambulatory Visit (INDEPENDENT_AMBULATORY_CARE_PROVIDER_SITE_OTHER)

## 2023-12-21 ENCOUNTER — Ambulatory Visit: Payer: Medicare Other

## 2023-12-21 DIAGNOSIS — I771 Stricture of artery: Secondary | ICD-10-CM

## 2023-12-21 DIAGNOSIS — I739 Peripheral vascular disease, unspecified: Secondary | ICD-10-CM

## 2023-12-21 LAB — VAS US ABI WITH/WO TBI
Left ABI: 1.08
Right ABI: 0.83

## 2023-12-25 ENCOUNTER — Other Ambulatory Visit: Payer: Self-pay | Admitting: Internal Medicine

## 2023-12-25 DIAGNOSIS — J432 Centrilobular emphysema: Secondary | ICD-10-CM

## 2023-12-27 DIAGNOSIS — B351 Tinea unguium: Secondary | ICD-10-CM | POA: Diagnosis not present

## 2023-12-27 DIAGNOSIS — L851 Acquired keratosis [keratoderma] palmaris et plantaris: Secondary | ICD-10-CM | POA: Diagnosis not present

## 2023-12-27 DIAGNOSIS — M79674 Pain in right toe(s): Secondary | ICD-10-CM | POA: Diagnosis not present

## 2023-12-27 DIAGNOSIS — E1142 Type 2 diabetes mellitus with diabetic polyneuropathy: Secondary | ICD-10-CM | POA: Diagnosis not present

## 2023-12-27 DIAGNOSIS — E1151 Type 2 diabetes mellitus with diabetic peripheral angiopathy without gangrene: Secondary | ICD-10-CM | POA: Diagnosis not present

## 2023-12-28 ENCOUNTER — Ambulatory Visit (INDEPENDENT_AMBULATORY_CARE_PROVIDER_SITE_OTHER): Admitting: Physician Assistant

## 2023-12-28 VITALS — BP 195/75 | HR 99 | Temp 97.8°F | Ht 67.0 in | Wt 129.0 lb

## 2023-12-28 DIAGNOSIS — I739 Peripheral vascular disease, unspecified: Secondary | ICD-10-CM | POA: Diagnosis not present

## 2023-12-28 DIAGNOSIS — I771 Stricture of artery: Secondary | ICD-10-CM

## 2023-12-28 NOTE — Progress Notes (Signed)
 Office Note     CC:  follow up Requesting Provider:  Anabel Halon, MD  HPI: Chase Briggs is a 73 y.o. (1950/12/19) male who presents for surveillance of PAD.  He has history of stenting of the right and left external iliac arteries in December 2019 by Dr. [name] due to bilateral lower extremity claudication.  He continues to have occasional hip claudication bilaterally especially when walking uphill however symptoms are tolerable at this time.  He denies any rest pain or tissue loss of bilateral lower extremities.  Past medical history so significant for insulin-dependent diabetes mellitus.  He continues to smoke on a daily basis.  He is on aspirin, Plavix, statin daily.  He follows regularly with a foot doctor for diabetic foot checks.   Past Medical History:  Diagnosis Date   Diabetes mellitus    Fatty liver    GERD (gastroesophageal reflux disease)    HTN (hypertension)    Hyperlipidemia    Insomnia    Renal insufficiency     Past Surgical History:  Procedure Laterality Date   ABDOMINAL AORTOGRAM W/LOWER EXTREMITY N/A 08/22/2018   Procedure: ABDOMINAL AORTOGRAM W/LOWER EXTREMITY;  Surgeon: [name], MD;  Location: [facility] INVASIVE CV LAB;  Service: Cardiovascular;  Laterality: N/A;   BACK SURGERY     CARPAL TUNNEL WITH CUBITAL TUNNEL Right 03/21/2019   Procedure: RIGHT CARPAL TUNNEL AND CUBITAL  TUNNEL RELEASES;  Surgeon: [name], MD;  Location: [facility];  Service: Orthopedics;  Laterality: Right;  AXILLARY BLOCK   COLONOSCOPY  01/2007   Dr. [name], reviewed report, mild diverticulosis. Prep adequate. Next TCS due 01/2017   ESOPHAGOGASTRODUODENOSCOPY N/A 10/21/2012   [id number] GASTRITIS   MASS EXCISION Right 10/26/2012   Procedure: EXCISION MASS;  Surgeon: [name], MD;  Location: [facility];  Service: General;  Laterality: Right;   PERIPHERAL VASCULAR INTERVENTION  08/22/2018   Procedure: PERIPHERAL VASCULAR INTERVENTION;  Surgeon: [name], MD;  Location: [facility] INVASIVE CV LAB;  Service: Cardiovascular;;  bilateral external iliac stents   scalp mass  4-5 yrs ago   Dr Angelique Holm NERVE TRANSPOSITION Right 03/21/2019   Procedure: DECOMPRESSION RIGHT ULNAR NERVE;  Surgeon: [name], MD;  Location:  SURGERY CENTER;  Service: Orthopedics;  Laterality: Right;    Social History   Socioeconomic History   Marital status: Married    Spouse name: Not on file   Number of children: 2   Years of education: Not on file   Highest education level: Not on file  Occupational History   Occupation: reitred    Comment: [employer], heavy lifting  Tobacco Use   Smoking status: Every Day    Current packs/day: 0.50    Average packs/day: 0.5 packs/day for 35.0 years (17.5 ttl pk-yrs)    Types: Cigarettes   Smokeless tobacco: Never  Vaping Use   Vaping status: Never Used  Substance and Sexual Activity   Alcohol use: No    Comment: hx heavy etoh x 3 yrs, quit 30+ yrs ago   Drug use: No   Sexual activity: Yes    Birth control/protection: None  Other Topics Concern   Not on file  Social History Narrative   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 Highest level of education: 12th grade. Married for 12 years. Has 2 sons (33 and 21). Lives with wife.   Social Drivers of Corporate investment banker Strain: Not on file  Food Insecurity: No  Food Insecurity (06/09/2023)   Hunger Vital Sign    Worried About Running Out of Food in the Last Year: Never true    Ran Out of Food in the Last Year: Never true  Transportation Needs: No Transportation Needs (06/09/2023)   PRAPARE - Administrator, Civil Service (Medical): No    Lack of Transportation (Non-Medical): No  Physical Activity: Not on file  Stress: Not on file  Social Connections: Not on file  Intimate Partner Violence: Not At Risk (06/09/2023)   Humiliation, Afraid, Rape, and Kick questionnaire    Fear of Current or Ex-Partner: No    Emotionally  Abused: No    Physically Abused: No    Sexually Abused: No    Family History  Problem Relation Age of Onset   Diabetes Mother    Diabetes Father    Diabetes Brother    Colon cancer Neg Hx    Liver disease Neg Hx     Current Outpatient Medications  Medication Sig Dispense Refill   albuterol (VENTOLIN HFA) 108 (90 Base) MCG/ACT inhaler INHALE 2 PUFFS INTO THE LUNGS EVERY 6 HOURS AS NEEDED FOR WHEEZING OR SHORTNESS OF BREATH 18 g 0   amLODipine (NORVASC) 10 MG tablet TAKE 1 TABLET BY MOUTH DAILY 90 tablet 3   aspirin EC 81 MG tablet Take 1 tablet (81 mg total) by mouth daily with breakfast. 30 tablet 11   atorvastatin (LIPITOR) 10 MG tablet Take 1 tablet (10 mg total) by mouth daily. 90 tablet 1   Cholecalciferol (VITAMIN D) 125 MCG (5000 UT) CAPS Take 2,000 Units by mouth daily. 30 capsule 3   cloNIDine (CATAPRES) 0.1 MG tablet Take 1 tablet (0.1 mg total) by mouth 2 (two) times daily. 180 tablet 3   clopidogrel (PLAVIX) 75 MG tablet TAKE (1) TABLET BY MOUTH ONCE DAILY. 90 tablet 3   EPINEPHrine 0.3 mg/0.3 mL IJ SOAJ injection Inject 0.3 mg into the muscle as needed for anaphylaxis. 1 each 1   ferrous sulfate 325 (65 FE) MG EC tablet Take 325 mg by mouth daily with breakfast.     glipiZIDE (GLUCOTROL XL) 2.5 MG 24 hr tablet TAKE 1 TABLET(2.5 MG) BY MOUTH DAILY WITH BREAKFAST 90 tablet 0   glucose blood test strip 1 each by Other route 2 (two) times daily. Use as instructed bid E11.65 Relion Premier 100 each 5   insulin glargine (LANTUS SOLOSTAR) 100 UNIT/ML Solostar Pen Inject 12 Units into the skin daily after breakfast. 15 mL 0   Lancets MISC 1 each by Does not apply route 2 (two) times daily. E11.65 Relion Premier 100 each 5   lisinopril (ZESTRIL) 40 MG tablet Take 1 tablet (40 mg total) by mouth daily. 90 tablet 1   mirtazapine (REMERON) 15 MG tablet TAKE 1 TABLET(15 MG) BY MOUTH AT BEDTIME 30 tablet 3   mupirocin ointment (BACTROBAN) 2 % Apply 1 Application topically 2 (two) times  daily. Over upper lip area. 22 g 0   SURE COMFORT PEN NEEDLES 32G X 4 MM MISC USE ONCE DAILY 100 each 3   No current facility-administered medications for this visit.    No Known Allergies   REVIEW OF SYSTEMS:   [X]  denotes positive finding, [ ]  denotes negative finding Cardiac  Comments:  Chest pain or chest pressure:    Shortness of breath upon exertion:    Short of breath when lying flat:    Irregular heart rhythm:        Vascular  Pain in calf, thigh, or hip brought on by ambulation:    Pain in feet at night that wakes you up from your sleep:     Blood clot in your veins:    Leg swelling:         Pulmonary    Oxygen at home:    Productive cough:     Wheezing:         Neurologic    Sudden weakness in arms or legs:     Sudden numbness in arms or legs:     Sudden onset of difficulty speaking or slurred speech:    Temporary loss of vision in one eye:     Problems with dizziness:         Gastrointestinal    Blood in stool:     Vomited blood:         Genitourinary    Burning when urinating:     Blood in urine:        Psychiatric    Major depression:         Hematologic    Bleeding problems:    Problems with blood clotting too easily:        Skin    Rashes or ulcers:        Constitutional    Fever or chills:      PHYSICAL EXAMINATION:  Vitals:   12/28/23 0931  BP: (!) 195/75  Pulse: 99  Temp: 97.8 F (36.6 C)  TempSrc: Temporal  SpO2: 95%  Weight: 129 lb (58.5 kg)  Height: 5\' 7"  (1.702 m)    General:  WDWN in NAD; vital signs documented above Gait: Not observed HENT: WNL, normocephalic Pulmonary: normal non-labored breathing , without Rales, rhonchi,  wheezing Cardiac: regular HR Abdomen: soft, NT, no masses Skin: without rashes Vascular Exam/Pulses: Absent pedal pulses; 2 + femoral pulses Extremities: without ischemic changes, without Gangrene , without cellulitis; without open wounds;  Musculoskeletal: no muscle wasting or  atrophy  Neurologic: A&O X 3 Psychiatric:  The pt has Normal affect.   Non-Invasive Vascular Imaging:   Aortoiliac duplex with widely patent bilateral common and external iliac arteries  ABI/TBIToday's ABIToday's TBIPrevious ABIPrevious TBI  +-------+-----------+-----------+------------+------------+  Right 0.83       0.51       0.77        0.41          +-------+-----------+-----------+------------+------------+  Left  1.08       0.89       0.99        0.63          +-------+-----------+-----------+------------+------------+     ASSESSMENT/PLAN:: 73 y.o. male here for follow up for surveillance of PAD with history of bilateral external iliac artery stenting  Mr Keaney overall is doing well.  He has mild claudication symptoms of both hips when walking uphill or stairs.  He does not have the symptoms when walking flat ground.  Currently symptoms are tolerable.  He denies any rest pain or tissue loss.  Aortoiliac duplex demonstrates widely patent iliac arteries bilaterally.  ABIs are unchanged over the past year.  No indication for further workup at this time.  I asked him to notify the office if claudication symptoms worsen.  He will continue his Plavix, statin, aspirin daily.  Encouraged smoking cessation.  We will repeat aortoiliac duplex and ABIs in 1 year.   Emilie Rutter, PA-C [facility] [phone]

## 2024-01-15 ENCOUNTER — Other Ambulatory Visit: Payer: Self-pay | Admitting: "Endocrinology

## 2024-01-24 ENCOUNTER — Other Ambulatory Visit: Payer: Self-pay

## 2024-01-24 ENCOUNTER — Other Ambulatory Visit: Payer: Medicare Other

## 2024-01-24 ENCOUNTER — Ambulatory Visit (INDEPENDENT_AMBULATORY_CARE_PROVIDER_SITE_OTHER)

## 2024-01-24 ENCOUNTER — Ambulatory Visit
Admission: EM | Admit: 2024-01-24 | Discharge: 2024-01-24 | Disposition: A | Discharge status: Home / Self Care | Attending: Physician Assistant | Admitting: Physician Assistant

## 2024-01-24 DIAGNOSIS — R051 Acute cough: Secondary | ICD-10-CM

## 2024-01-24 DIAGNOSIS — J209 Acute bronchitis, unspecified: Secondary | ICD-10-CM

## 2024-01-24 DIAGNOSIS — R972 Elevated prostate specific antigen [PSA]: Secondary | ICD-10-CM | POA: Diagnosis not present

## 2024-01-24 DIAGNOSIS — R059 Cough, unspecified: Secondary | ICD-10-CM | POA: Diagnosis not present

## 2024-01-24 MED ORDER — AZITHROMYCIN 250 MG PO TABS: 250.0000 mg | ORAL_TABLET | Freq: Every day | ORAL | 0 refills | Status: DC

## 2024-01-24 MED ORDER — ALBUTEROL SULFATE HFA 108 (90 BASE) MCG/ACT IN AERS: 2.0000 | INHALATION_SPRAY | RESPIRATORY_TRACT | 0 refills | Status: DC | PRN

## 2024-01-24 NOTE — ED Provider Notes (Signed)
 [facility]    CSN: [id number] Arrival date & time: 01/24/24  0855      History   Chief Complaint No chief complaint on file.   HPI Chase Briggs is a 73 y.o. male.   Patient here concerned with cough x 3 days.  PMH including DM, HTN, CKD 3.  He reports history of smoking between 1/2 - 1 PPD x 50+ years.  He denies history of COPD/asthma.  Cough is largely dry, worse at night when he lays down.  He has night time wheezing. Denies URI sx, LE edema, chest pain, SOB.  He is taking OTC cough medication w/o relief.      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   Left inguinal hernia 01/20/2023   Encounter for examination following treatment at hospital 01/20/2023   Centrilobular emphysema (HCC) 11/26/2022   Lumbar herniated disc 09/15/2022   Bilateral sciatica 09/15/2022   Hospital discharge follow-up 04/30/2022   Atypical chest pain 04/23/2022   Current smoker 07/22/2021   Iron deficiency anemia 07/22/2021   PAD (peripheral artery disease) (HCC) 07/22/2021   Allergic rhinitis 07/22/2021   Insomnia 12/12/2019   Bilateral carpal tunnel syndrome 11/23/2018   Entrapment of right ulnar nerve 11/23/2018   Mixed hyperlipidemia 08/27/2015   Vitamin D  deficiency 08/27/2015   Type 2 diabetes mellitus with other specified complication (HCC) 02/20/2014   Mild protein-calorie malnutrition (HCC)  02/20/2014   Fatty liver 09/21/2012   Essential hypertension 01/07/2011    Past Surgical History:  Procedure Laterality Date   ABDOMINAL AORTOGRAM W/LOWER EXTREMITY N/A 08/22/2018   Procedure: ABDOMINAL AORTOGRAM W/LOWER EXTREMITY;  Surgeon: Adine Hoof, MD;  Location: [facility] INVASIVE CV LAB;  Service: Cardiovascular;  Laterality: N/A;   BACK SURGERY     CARPAL TUNNEL WITH CUBITAL TUNNEL Right 03/21/2019   Procedure: RIGHT CARPAL TUNNEL AND CUBITAL  TUNNEL RELEASES;  Surgeon: Lyanne Sample, MD;  Location: [facility];  Service: Orthopedics;  Laterality: Right;  AXILLARY BLOCK   COLONOSCOPY  01/2007   Dr. Larrie Po, reviewed report, mild diverticulosis. Prep adequate. Next TCS due 01/2017   ESOPHAGOGASTRODUODENOSCOPY N/A 10/21/2012   EXB:[id number] GASTRITIS   MASS EXCISION Right 10/26/2012   Procedure: EXCISION MASS;  Surgeon: Beau Bound, MD;  Location: [facility];  Service: General;  Laterality: Right;   PERIPHERAL VASCULAR INTERVENTION  08/22/2018   Procedure: PERIPHERAL VASCULAR INTERVENTION;  Surgeon: Adine Hoof, MD;  Location: [facility] INVASIVE CV LAB;  Service: Cardiovascular;;  bilateral external iliac stents   scalp mass  4-5 yrs ago   Dr Hervey Loser NERVE TRANSPOSITION Right 03/21/2019   Procedure: DECOMPRESSION RIGHT ULNAR NERVE;  Surgeon: Lyanne Sample, MD;  Location:  SURGERY CENTER;  Service: Orthopedics;  Laterality: Right;       Home Medications    Prior to Admission medications   Medication Sig Start  Date End Date Taking? Authorizing Provider  albuterol  (VENTOLIN  HFA) 108 (90 Base) MCG/ACT inhaler Inhale 2 puffs into the lungs every 4 (four) hours as needed for wheezing or shortness of breath. 01/24/24  Yes Lavonia Powers, PA-C  azithromycin (ZITHROMAX) 250 MG tablet Take 1 tablet (250 mg total) by mouth daily. Take first 2 tablets together, then 1 every day until finished. 01/24/24  Yes Lavonia Powers, PA-C  albuterol  (VENTOLIN  HFA) 108  (90 Base) MCG/ACT inhaler INHALE 2 PUFFS INTO THE LUNGS EVERY 6 HOURS AS NEEDED FOR WHEEZING OR SHORTNESS OF BREATH 12/27/23   Meldon Sport, MD  amLODipine  (NORVASC ) 10 MG tablet TAKE 1 TABLET BY MOUTH DAILY 09/20/23   Laurann Pollock, MD  aspirin  EC 81 MG tablet Take 1 tablet (81 mg total) by mouth daily with breakfast. 04/23/22   Emokpae, Courage, MD  atorvastatin  (LIPITOR) 10 MG tablet Take 1 tablet (10 mg total) by mouth daily. 08/18/23   Meldon Sport, MD  Cholecalciferol (VITAMIN D ) 125 MCG (5000 UT) CAPS Take 2,000 Units by mouth daily. 04/23/22   Colin Dawley, MD  cloNIDine  (CATAPRES ) 0.1 MG tablet Take 1 tablet (0.1 mg total) by mouth 2 (two) times daily. 04/06/23   Meldon Sport, MD  clopidogrel  (PLAVIX ) 75 MG tablet TAKE (1) TABLET BY MOUTH ONCE DAILY. 04/06/23   Meldon Sport, MD  EPINEPHrine  0.3 mg/0.3 mL IJ SOAJ injection Inject 0.3 mg into the muscle as needed for anaphylaxis. 02/10/23   Lillia Reilly, MD  ferrous sulfate  325 (65 FE) MG EC tablet Take 325 mg by mouth daily with breakfast.    [provider]  glipiZIDE  (GLUCOTROL  XL) 2.5 MG 24 hr tablet TAKE 1 TABLET(2.5 MG) BY MOUTH DAILY WITH BREAKFAST 01/17/24   Nida, Gebreselassie W, MD  glucose blood test strip 1 each by Other route 2 (two) times daily. Use as instructed bid E11.65 Relion Premier 05/15/19   Nida, Gebreselassie W, MD  insulin  glargine (LANTUS  SOLOSTAR) 100 UNIT/ML Solostar Pen Inject 12 Units into the skin daily after breakfast. 08/30/23   Nida, Jaynee Meyer, MD  Lancets MISC 1 each by Does not apply route 2 (two) times daily. E11.65 Relion Premier 05/15/19   Nida, Gebreselassie W, MD  lisinopril  (ZESTRIL ) 40 MG tablet Take 1 tablet (40 mg total) by mouth daily. 10/29/23   Meldon Sport, MD  mirtazapine  (REMERON ) 15 MG tablet TAKE 1 TABLET(15 MG) BY MOUTH AT BEDTIME 10/14/23   Patel, Rutwik K, MD  mupirocin  ointment (BACTROBAN ) 2 % Apply 1 Application topically 2 (two) times daily. Over upper lip  area. 05/25/23   Meldon Sport, MD  SURE COMFORT PEN NEEDLES 32G X 4 MM MISC USE ONCE DAILY 10/26/23   Baby Bolt, MD    Family History Family History  Problem Relation Age of Onset   Diabetes Mother    Diabetes Father    Diabetes Brother    Colon cancer Neg Hx    Liver disease Neg Hx     Social History Social History   Tobacco Use   Smoking status: Every Day    Current packs/day: 0.50    Average packs/day: 0.5 packs/day for 35.0 years (17.5 ttl pk-yrs)    Types: Cigarettes   Smokeless tobacco: Never  Vaping Use   Vaping status: Never Used  Substance Use Topics   Alcohol use: No    Comment: hx heavy etoh x 3 yrs, quit 30+ yrs ago   Drug use: No  Allergies   Patient has no known allergies.   Review of Systems Review of Systems  Constitutional:  Negative for chills, fatigue, fever and unexpected weight change.  HENT:  Negative for congestion, ear pain, nosebleeds, postnasal drip, rhinorrhea, sinus pressure, sinus pain and sore throat.   Eyes:  Negative for pain and redness.  Respiratory:  Positive for cough and wheezing. Negative for shortness of breath.   Cardiovascular:  Negative for chest pain, palpitations and leg swelling.  Gastrointestinal:  Negative for abdominal pain, diarrhea, nausea and vomiting.  Musculoskeletal:  Negative for arthralgias and myalgias.  Skin:  Negative for rash.  Neurological:  Negative for light-headedness and headaches.  Hematological:  Negative for adenopathy. Does not bruise/bleed easily.  Psychiatric/Behavioral:  Negative for confusion and sleep disturbance.      Physical Exam Triage Vital Signs ED Triage Vitals  Encounter Vitals Group     BP 01/24/24 0918 (!) 146/77     Systolic BP Percentile --      Diastolic BP Percentile --      Pulse Rate 01/24/24 0918 95     Resp 01/24/24 0918 20     Temp 01/24/24 0918 98.6 F (37 C)     Temp Source 01/24/24 0918 Oral     SpO2 01/24/24 0918 90 %     Weight --       Height --      Head Circumference --      Peak Flow --      Pain Score 01/24/24 0921 0     Pain Loc --      Pain Education --      Exclude from Growth Chart --      No data found.  Updated Vital Signs BP (!) 146/77 (BP Location: Right Arm)   Pulse 95   Temp 98.6 F (37 C) (Oral)   Resp 20   SpO2 90%   Visual Acuity Right Eye Distance:   Left Eye Distance:   Bilateral Distance:    Right Eye Near:   Left Eye Near:    Bilateral Near:     Physical Exam Vitals and nursing note reviewed.  Constitutional:      General: He is not in acute distress.    Appearance: Normal appearance. He is not ill-appearing.  HENT:     Head: Normocephalic and atraumatic.     Right Ear: Tympanic membrane and ear canal normal.     Left Ear: Tympanic membrane and ear canal normal.     Nose: No mucosal edema, congestion or rhinorrhea.     Mouth/Throat:     Pharynx: Uvula midline. No pharyngeal swelling, oropharyngeal exudate, posterior oropharyngeal erythema or uvula swelling.     Tonsils: No tonsillar exudate or tonsillar abscesses.  Eyes:     General: No scleral icterus.    Extraocular Movements: Extraocular movements intact.     Conjunctiva/sclera: Conjunctivae normal.  Cardiovascular:     Rate and Rhythm: Normal rate and regular rhythm.     Heart sounds: No murmur heard. Pulmonary:     Effort: Pulmonary effort is normal. No respiratory distress.     Breath sounds: Normal breath sounds. No wheezing or rales.  Musculoskeletal:     Cervical back: Normal range of motion. No rigidity.  Lymphadenopathy:     Cervical: No cervical adenopathy.  Skin:    Capillary Refill: Capillary refill takes less than 2 seconds.     Coloration: Skin is not jaundiced.     Findings: No  rash.  Neurological:     General: No focal deficit present.     Mental Status: He is alert and oriented to person, place, and time.     Motor: No weakness.     Gait: Gait normal.  Psychiatric:        Mood and Affect: Mood  normal.        Behavior: Behavior normal.      UC Treatments / Results  Labs (all labs ordered are listed, but only abnormal results are displayed) Labs Reviewed - No data to display  EKG   Radiology DG Chest 2 View Result Date: 01/24/2024 CLINICAL DATA:  Cough for 3 weeks. EXAM: CHEST - 2 VIEW COMPARISON:  June 11, 2023. FINDINGS: The heart size and mediastinal contours are within normal limits. Both lungs are clear. The visualized skeletal structures are unremarkable. IMPRESSION: No active cardiopulmonary disease. Electronically Signed   By: Rosalene Colon M.D.   On: 01/24/2024 10:36    Procedures Procedures (including critical care time)  Medications Ordered in UC Medications - No data to display  Initial Impression / Assessment and Plan / UC Course  I have reviewed the triage vital signs and the nursing notes.  Pertinent labs & imaging results that were available during my care of the patient were reviewed by me and considered in my medical decision making (see chart for details).     Exam overall benign, however, he has history of insulin  dependant DM and long smoking history, so I will check a cxr.    CXR without pneumonia, though concerning for COPD Will treat with azithromycin and albuterol , he is a diabetic so I will not start prednisone  at this time Final Clinical Impressions(s) / UC Diagnoses   Final diagnoses:  Acute cough  Acute bronchitis, unspecified organism     Discharge Instructions      Take medication as prescribed   ED Prescriptions     Medication Sig Dispense Auth. Provider   albuterol  (VENTOLIN  HFA) 108 (90 Base) MCG/ACT inhaler Inhale 2 puffs into the lungs every 4 (four) hours as needed for wheezing or shortness of breath. 18 g Lavonia Powers, PA-C   azithromycin (ZITHROMAX) 250 MG tablet Take 1 tablet (250 mg total) by mouth daily. Take first 2 tablets together, then 1 every day until finished. 6 tablet Lavonia Powers, PA-C       PDMP not reviewed this encounter.   Lavonia Powers, PA-C 01/24/24 1041

## 2024-01-24 NOTE — Discharge Instructions (Addendum)
 Take medication as prescribed.

## 2024-01-24 NOTE — ED Triage Notes (Addendum)
 Pt reports dry cough x 3 weeks states at bedtime it gets worse making it difficult to sleep. Pt has tired OTC cough syrups and cough drops.

## 2024-01-25 LAB — PSA: Prostate Specific Ag, Serum: 0.7 ng/mL (ref 0.0–4.0)

## 2024-01-26 ENCOUNTER — Ambulatory Visit: Payer: Self-pay

## 2024-02-02 ENCOUNTER — Encounter: Payer: Self-pay | Admitting: Internal Medicine

## 2024-02-02 ENCOUNTER — Encounter: Payer: Self-pay | Admitting: Urology

## 2024-02-02 ENCOUNTER — Ambulatory Visit (INDEPENDENT_AMBULATORY_CARE_PROVIDER_SITE_OTHER): Admitting: Internal Medicine

## 2024-02-02 ENCOUNTER — Ambulatory Visit (INDEPENDENT_AMBULATORY_CARE_PROVIDER_SITE_OTHER): Payer: Medicare Other | Admitting: Urology

## 2024-02-02 VITALS — BP 177/92 | HR 85

## 2024-02-02 VITALS — BP 137/82 | HR 70 | Ht 67.0 in | Wt 128.4 lb

## 2024-02-02 DIAGNOSIS — N509 Disorder of male genital organs, unspecified: Secondary | ICD-10-CM | POA: Diagnosis not present

## 2024-02-02 DIAGNOSIS — R3912 Poor urinary stream: Secondary | ICD-10-CM

## 2024-02-02 DIAGNOSIS — Z87898 Personal history of other specified conditions: Secondary | ICD-10-CM | POA: Diagnosis not present

## 2024-02-02 DIAGNOSIS — R053 Chronic cough: Secondary | ICD-10-CM | POA: Diagnosis not present

## 2024-02-02 DIAGNOSIS — N401 Enlarged prostate with lower urinary tract symptoms: Secondary | ICD-10-CM | POA: Diagnosis not present

## 2024-02-02 DIAGNOSIS — K219 Gastro-esophageal reflux disease without esophagitis: Secondary | ICD-10-CM | POA: Diagnosis not present

## 2024-02-02 DIAGNOSIS — R972 Elevated prostate specific antigen [PSA]: Secondary | ICD-10-CM

## 2024-02-02 DIAGNOSIS — N138 Other obstructive and reflux uropathy: Secondary | ICD-10-CM | POA: Diagnosis not present

## 2024-02-02 DIAGNOSIS — J432 Centrilobular emphysema: Secondary | ICD-10-CM | POA: Diagnosis not present

## 2024-02-02 LAB — MICROSCOPIC EXAMINATION

## 2024-02-02 LAB — URINALYSIS, ROUTINE W REFLEX MICROSCOPIC
Bilirubin, UA: NEGATIVE
Glucose, UA: NEGATIVE
Ketones, UA: NEGATIVE
Nitrite, UA: NEGATIVE
Specific Gravity, UA: 1.01 (ref 1.005–1.030)
Urobilinogen, Ur: 0.2 mg/dL (ref 0.2–1.0)
pH, UA: 7 (ref 5.0–7.5)

## 2024-02-02 MED ORDER — AZITHROMYCIN 250 MG PO TABS: 250.0000 mg | ORAL_TABLET | Freq: Every day | ORAL | 0 refills | Status: DC

## 2024-02-02 MED ORDER — FAMOTIDINE 20 MG PO TABS: 20.0000 mg | ORAL_TABLET | Freq: Two times a day (BID) | ORAL | 2 refills | Status: DC

## 2024-02-02 NOTE — Patient Instructions (Signed)
 It was a pleasure to see you today.  Thank you for giving us  the opportunity to be involved in your care.  Below is a brief recap of your visit and next steps.  We will plan to see you again on 6/17.  Summary Z pak refilled Pepcid  added  Follow up with Dr. Lydia Sams as schedule on 6/17.

## 2024-02-02 NOTE — Assessment & Plan Note (Signed)
 Presents today for an acute visit endorsing a persistent, dry cough.  Symptoms temporarily resolved after completing a Z-Pak that was prescribed 5/12.  The cough returned last night.  He states that it only occurs when lying flat, mostly on his right side, and is nonproductive.  Pulmonary exam is unremarkable today.  He denies shortness of breath.  Documented history of centrilobular emphysema but is not using his albuterol  inhaler more frequently. -Treatment options reviewed.  He would like another Z-Pak since it alleviated his symptoms recently.  I reviewed that his symptoms may not be attributable to COPD exacerbation given pattern of onset (they only occur while lying down) and may be due to other etiologies such as GERD.  Z-Pak refilled at his request, though I have also prescribed Pepcid .  He will try Pepcid  initially to see if this alleviates his symptoms.  If not, he will complete a Z-Pak.  He is scheduled for PCP follow-up on 6/17 but was encouraged to return to care if symptoms worsen or fail to improve.

## 2024-02-02 NOTE — Progress Notes (Signed)
 Acute Office Visit  Subjective:     Patient ID: Chase Briggs, male    DOB: October 27, 1950, 73 y.o.   MRN: [id number]  Chief Complaint  Patient presents with   Cough    Cough for almost three weeks, coughing bad at night keeping patient up, patient went to urgent care over a week ago , xray was normal , has gotten some relief from medication that was given    Chase Briggs presents today for an acute visit endorsing a cough x 1 day.  He recently presented to urgent care 5/12 endorsing a nonproductive cough x 3 days.  Chest x-ray obtained at that time did not show any acute findings.  He was empirically treated for COPD exacerbation with a Z-Pak.  He states that symptoms abruptly resolved after taking 2 doses of azithromycin .  He had been asymptomatic until last night.  Symptoms began overnight while laying down.  He states that the cough is nonproductive and is worse when laying on his right side.  He denies shortness of breath and sputum production.  He has a documented history of centrilobular emphysema and is prescribed albuterol  as needed.  He has not needed to use his albuterol  inhaler frequently.  Review of Systems  Respiratory:  Positive for cough. Negative for sputum production and shortness of breath.       Objective:     BP 137/82   Pulse 70   Ht 5\' 7"  (1.702 m)   Wt 128 lb 6.4 oz (58.2 kg)   SpO2 94%   BMI 20.11 kg/m  BP Readings from Last 3 Encounters:  02/02/24 137/82  02/02/24 (!) 177/92  01/24/24 (!) 146/77   Physical Exam Vitals reviewed.  Constitutional:      General: He is not in acute distress.    Appearance: Normal appearance. He is not ill-appearing.  HENT:     Head: Normocephalic and atraumatic.     Right Ear: External ear normal.     Left Ear: External ear normal.     Nose: Nose normal. No congestion or rhinorrhea.     Mouth/Throat:     Mouth: Mucous membranes are moist.     Pharynx: Oropharynx is clear.  Eyes:     General: No scleral  icterus.    Extraocular Movements: Extraocular movements intact.     Conjunctiva/sclera: Conjunctivae normal.     Pupils: Pupils are equal, round, and reactive to light.  Cardiovascular:     Rate and Rhythm: Normal rate and regular rhythm.     Pulses: Normal pulses.     Heart sounds: Normal heart sounds. No murmur heard. Pulmonary:     Effort: Pulmonary effort is normal.     Breath sounds: Normal breath sounds. No wheezing, rhonchi or rales.  Abdominal:     General: Abdomen is flat. Bowel sounds are normal. There is no distension.     Palpations: Abdomen is soft.     Tenderness: There is no abdominal tenderness.  Musculoskeletal:        General: No swelling or deformity. Normal range of motion.     Cervical back: Normal range of motion.  Skin:    General: Skin is warm and dry.     Capillary Refill: Capillary refill takes less than 2 seconds.  Neurological:     General: No focal deficit present.     Mental Status: He is alert and oriented to person, place, and time.     Motor: No weakness.  Psychiatric:  Mood and Affect: Mood normal.        Behavior: Behavior normal.        Thought Content: Thought content normal.     Assessment & Plan:   Problem List Items Addressed This Visit       Persistent cough   Presents today for an acute visit endorsing a persistent, dry cough.  Symptoms temporarily resolved after completing a Z-Pak that was prescribed 5/12.  The cough returned last night.  He states that it only occurs when lying flat, mostly on his right side, and is nonproductive.  Pulmonary exam is unremarkable today.  He denies shortness of breath.  Documented history of centrilobular emphysema but is not using his albuterol  inhaler more frequently. -Treatment options reviewed.  He would like another Z-Pak since it alleviated his symptoms recently.  I reviewed that his symptoms may not be attributable to COPD exacerbation given pattern of onset (they only occur while lying  down) and may be due to other etiologies such as GERD.  Z-Pak refilled at his request, though I have also prescribed Pepcid .  He will try Pepcid  initially to see if this alleviates his symptoms.  If not, he will complete a Z-Pak.  He is scheduled for PCP follow-up on 6/17 but was encouraged to return to care if symptoms worsen or fail to improve.      Meds ordered this encounter  Medications   azithromycin  (ZITHROMAX ) 250 MG tablet    Sig: Take 1 tablet (250 mg total) by mouth daily. Take first 2 tablets together, then 1 every day until finished.    Dispense:  6 tablet    Refill:  0   famotidine  (PEPCID ) 20 MG tablet    Sig: Take 1 tablet (20 mg total) by mouth 2 (two) times daily.    Dispense:  60 tablet    Refill:  2    Return if symptoms worsen or fail to improve.  Tobi Fortes, MD

## 2024-02-02 NOTE — Patient Instructions (Signed)

## 2024-02-02 NOTE — Progress Notes (Signed)
 02/02/2024 9:06 AM   Chase Briggs December 08, 1950 [id number]  Referring provider: Meldon Sport, MD [address] [place],  [place] [address]  Followup BPH   HPI: Chase Briggs is a 72yo here for followup for elevated PSA and BPH. PSA decreased to 0.7. IPSS 7 QOl 1 on no BPh therapy. He denies any pelvic pain. Urine stream strong. No straining to urinate. Nocturia 0-1x depending on fluid consumption.    PMH: Past Medical History:  Diagnosis Date   Diabetes mellitus    Fatty liver    GERD (gastroesophageal reflux disease)    HTN (hypertension)    Hyperlipidemia    Insomnia    Renal insufficiency     Surgical History: Past Surgical History:  Procedure Laterality Date   ABDOMINAL AORTOGRAM W/LOWER EXTREMITY N/A 08/22/2018   Procedure: ABDOMINAL AORTOGRAM W/LOWER EXTREMITY;  Surgeon: Chase Hoof, MD;  Location: [facility] INVASIVE CV LAB;  Service: Cardiovascular;  Laterality: N/A;   BACK SURGERY     CARPAL TUNNEL WITH CUBITAL TUNNEL Right 03/21/2019   Procedure: RIGHT CARPAL TUNNEL AND CUBITAL  TUNNEL RELEASES;  Surgeon: Chase Sample, MD;  Location: [facility];  Service: Orthopedics;  Laterality: Right;  AXILLARY BLOCK   COLONOSCOPY  01/2007   Dr. Larrie Briggs, reviewed report, mild diverticulosis. Prep adequate. Next TCS due 01/2017   ESOPHAGOGASTRODUODENOSCOPY N/A 10/21/2012   [id number] GASTRITIS   MASS EXCISION Right 10/26/2012   Procedure: EXCISION MASS;  Surgeon: Chase Bound, MD;  Location: [facility];  Service: General;  Laterality: Right;   PERIPHERAL VASCULAR INTERVENTION  08/22/2018   Procedure: PERIPHERAL VASCULAR INTERVENTION;  Surgeon: Chase Hoof, MD;  Location: [facility] INVASIVE CV LAB;  Service: Cardiovascular;;  bilateral external iliac stents   scalp mass  4-5 yrs ago   Dr Chase Briggs NERVE TRANSPOSITION Right 03/21/2019   Procedure: DECOMPRESSION RIGHT ULNAR NERVE;  Surgeon: Chase Sample, MD;  Location: [facility];   Service: Orthopedics;  Laterality: Right;    Home Medications:  Allergies as of 02/02/2024   No Known Allergies      Medication List        Accurate as of Feb 02, 2024  9:06 AM. If you have any questions, ask your nurse or doctor.          albuterol  108 (90 Base) MCG/ACT inhaler Commonly known as: VENTOLIN  HFA INHALE 2 PUFFS INTO THE LUNGS EVERY 6 HOURS AS NEEDED FOR WHEEZING OR SHORTNESS OF BREATH   albuterol  108 (90 Base) MCG/ACT inhaler Commonly known as: VENTOLIN  HFA Inhale 2 puffs into the lungs every 4 (four) hours as needed for wheezing or shortness of breath.   amLODipine  10 MG tablet Commonly known as: NORVASC  TAKE 1 TABLET BY MOUTH DAILY   aspirin  EC 81 MG tablet Take 1 tablet (81 mg total) by mouth daily with breakfast.   atorvastatin  10 MG tablet Commonly known as: LIPITOR Take 1 tablet (10 mg total) by mouth daily.   azithromycin  250 MG tablet Commonly known as: ZITHROMAX  Take 1 tablet (250 mg total) by mouth daily. Take first 2 tablets together, then 1 every day until finished.   cloNIDine  0.1 MG tablet Commonly known as: CATAPRES  Take 1 tablet (0.1 mg total) by mouth 2 (two) times daily.   clopidogrel  75 MG tablet Commonly known as: PLAVIX  TAKE (1) TABLET BY MOUTH ONCE DAILY.   EPINEPHrine  0.3 mg/0.3 mL Soaj injection Commonly known as: EPI-PEN Inject 0.3 mg into the muscle as  needed for anaphylaxis.   ferrous sulfate  325 (65 FE) MG EC tablet Take 325 mg by mouth daily with breakfast.   glipiZIDE  2.5 MG 24 hr tablet Commonly known as: GLUCOTROL  XL TAKE 1 TABLET(2.5 MG) BY MOUTH DAILY WITH BREAKFAST   glucose blood test strip 1 each by Other route 2 (two) times daily. Use as instructed bid E11.65 Relion Premier   Lancets Misc 1 each by Does not apply route 2 (two) times daily. E11.65 Relion Premier   Lantus  SoloStar 100 UNIT/ML Solostar Pen Generic drug: insulin  glargine Inject 12 Units into the skin daily after breakfast.    lisinopril  40 MG tablet Commonly known as: ZESTRIL  Take 1 tablet (40 mg total) by mouth daily.   mirtazapine  15 MG tablet Commonly known as: REMERON  TAKE 1 TABLET(15 MG) BY MOUTH AT BEDTIME   mupirocin  ointment 2 % Commonly known as: BACTROBAN  Apply 1 Application topically 2 (two) times daily. Over upper lip area.   Sure Comfort Pen Needles 32G X 4 MM Misc Generic drug: Insulin  Pen Needle USE ONCE DAILY   Vitamin D  125 MCG (5000 UT) Caps Take 2,000 Units by mouth daily.        Allergies: No Known Allergies  Family History: Family History  Problem Relation Age of Onset   Diabetes Mother    Diabetes Father    Diabetes Brother    Colon cancer Neg Hx    Liver disease Neg Hx     Social History:  reports that he has been smoking cigarettes. He has a 17.5 pack-year smoking history. He has never used smokeless tobacco. He reports that he does not drink alcohol and does not use drugs.  ROS: All other review of systems were reviewed and are negative except what is noted above in HPI  Physical Exam: BP (!) 177/92   Pulse 85   Constitutional:  Alert and oriented, No acute distress. HEENT: [place] AT, moist mucus membranes.  Trachea midline, no masses. Cardiovascular: No clubbing, cyanosis, or edema. Respiratory: Normal respiratory effort, no increased work of breathing. GI: Abdomen is soft, nontender, nondistended, no abdominal masses GU: No CVA tenderness.  Lymph: No cervical or inguinal lymphadenopathy. Skin: No rashes, bruises or suspicious lesions. Neurologic: Grossly intact, no focal deficits, moving all 4 extremities. Psychiatric: Normal mood and affect.  Laboratory Data: Lab Results  Component Value Date   WBC 9.6 07/29/2023   HGB 13.0 07/29/2023   HCT 40.1 07/29/2023   MCV 97.1 07/29/2023   PLT 290 07/29/2023    Lab Results  Component Value Date   CREATININE 1.09 08/23/2023    No results found for: "PSA"  No results found for: "TESTOSTERONE"  Lab  Results  Component Value Date   HGBA1C 6.3 (H) 06/09/2023    Urinalysis    Component Value Date/Time   COLORURINE STRAW (A) 06/09/2023 1030   APPEARANCEUR CLEAR 06/09/2023 1030   APPEARANCEUR Clear 05/19/2023 1408   LABSPEC 1.014 06/09/2023 1030   PHURINE 7.0 06/09/2023 1030   GLUCOSEU NEGATIVE 06/09/2023 1030   HGBUR NEGATIVE 06/09/2023 1030   BILIRUBINUR NEGATIVE 06/09/2023 1030   BILIRUBINUR Negative 05/19/2023 1408   KETONESUR NEGATIVE 06/09/2023 1030   PROTEINUR NEGATIVE 06/09/2023 1030   UROBILINOGEN 1.0 03/06/2022 0829   UROBILINOGEN 0.2 02/19/2014 1340   NITRITE NEGATIVE 06/09/2023 1030   LEUKOCYTESUR NEGATIVE 06/09/2023 1030    Lab Results  Component Value Date   LABMICR 78.6 10/29/2023   WBCUA >30 (A) 05/19/2023   LABEPIT 0-10 05/19/2023   MUCUS  Present 09/23/2020   BACTERIA Few (A) 05/19/2023    Pertinent Imaging:  No results found for this or any previous visit.  Results for orders placed during the hospital encounter of 06/09/23  US  Venous Img Lower Bilateral (DVT)  Narrative CLINICAL DATA:  Shortness of breath. Bilateral lower extremity pain. Evaluate for DVT.  EXAM: BILATERAL LOWER EXTREMITY VENOUS DOPPLER ULTRASOUND  TECHNIQUE: Gray-scale sonography with graded compression, as well as color Doppler and duplex ultrasound were performed to evaluate the lower extremity deep venous systems from the level of the common femoral vein and including the common femoral, femoral, profunda femoral, popliteal and calf veins including the posterior tibial, peroneal and gastrocnemius veins when visible. The superficial great saphenous vein was also interrogated. Spectral Doppler was utilized to evaluate flow at rest and with distal augmentation maneuvers in the common femoral, femoral and popliteal veins.  COMPARISON:  Bilateral lower extremity venous Doppler ultrasound-10/15/2022 (negative)  FINDINGS: RIGHT LOWER EXTREMITY  Common Femoral Vein: No  evidence of thrombus. Normal compressibility, respiratory phasicity and response to augmentation.  Saphenofemoral Junction: No evidence of thrombus. Normal compressibility and flow on color Doppler imaging.  Profunda Femoral Vein: No evidence of thrombus. Normal compressibility and flow on color Doppler imaging.  Femoral Vein: No evidence of thrombus. Normal compressibility, respiratory phasicity and response to augmentation.  Popliteal Vein: No evidence of thrombus. Normal compressibility, respiratory phasicity and response to augmentation.  Calf Veins: No evidence of thrombus. Normal compressibility and flow on color Doppler imaging.  Superficial Great Saphenous Vein: No evidence of thrombus. Normal compressibility.  Other Findings:  None.  LEFT LOWER EXTREMITY  Common Femoral Vein: No evidence of thrombus. Normal compressibility, respiratory phasicity and response to augmentation.  Saphenofemoral Junction: No evidence of thrombus. Normal compressibility and flow on color Doppler imaging.  Profunda Femoral Vein: No evidence of thrombus. Normal compressibility and flow on color Doppler imaging.  Femoral Vein: No evidence of thrombus. Normal compressibility, respiratory phasicity and response to augmentation.  Popliteal Vein: No evidence of thrombus. Normal compressibility, respiratory phasicity and response to augmentation.  Calf Veins: No evidence of thrombus. Normal compressibility and flow on color Doppler imaging.  Superficial Great Saphenous Vein: No evidence of thrombus. Normal compressibility.  Other Findings:  None.  IMPRESSION: No evidence of DVT within either lower extremity.   Electronically Signed By: Robbi Childs M.D. On: 06/11/2023 16:27  No results found for this or any previous visit.  No results found for this or any previous visit.  No results found for this or any previous visit.  No results found for this or any previous visit.  No  results found for this or any previous visit.  Results for orders placed during the hospital encounter of 01/28/21  CT Renal Stone Study  Narrative CLINICAL DATA:  Left-sided flank pain and dysuria  EXAM: CT ABDOMEN AND PELVIS WITHOUT CONTRAST  TECHNIQUE: Multidetector CT imaging of the abdomen and pelvis was performed following the standard protocol without IV contrast.  COMPARISON:  10/30/2017  FINDINGS: Lower chest: Emphysematous changes are noted. No focal infiltrate or effusion is seen.  Hepatobiliary: Gallbladder is within normal limits. Area of focal fatty infiltration is noted along the falciform ligament. No other focal abnormality is noted.  Pancreas: Unremarkable. No pancreatic ductal dilatation or surrounding inflammatory changes.  Spleen: Normal in size without focal abnormality.  Adrenals/Urinary Tract: Adrenal glands are within normal limits. Right kidney is well visualized without renal calculi or obstructive changes. The left kidney demonstrates some increased perinephric  stranding. A few tiny left renal stones are noted. No stones are identified. The bladder is incompletely distended.  Stomach/Bowel: Scattered diverticular change of the colon is noted. The appendix is well visualized and within normal limits. Small bowel and stomach are unremarkable.  Vascular/Lymphatic: Aortic atherosclerosis. No enlarged abdominal or pelvic lymph nodes.  Reproductive: Prostate is unremarkable.  Other: No abdominal wall hernia or abnormality. No abdominopelvic ascites.  Musculoskeletal: Degenerative changes of lumbar spine are noted.  IMPRESSION: A few tiny nonobstructing left renal calculi.  Mild diverticular change without diverticulitis.  Mild fatty infiltration of the liver along the falciform ligament.  Aortic Atherosclerosis (ICD10-I70.0) and Emphysema (ICD10-J43.9).   Electronically Signed By: Violeta Grey M.D. On: 01/28/2021  15:18   Assessment & Plan:    1. Elevated PSA (Primary) -followup 6 months with a PSA We will obtain an MRI pelvis due to history of seminal vesical mass - Urinalysis, Routine w reflex microscopic  2. Benign prostatic hyperplasia with urinary obstruction -patient defers therapy at this time  3. Weak urinary stream -resolved   No follow-ups on file.  Johnie Nailer, MD  [facility] [place]

## 2024-02-03 ENCOUNTER — Other Ambulatory Visit: Payer: Self-pay | Admitting: Internal Medicine

## 2024-02-03 DIAGNOSIS — G4700 Insomnia, unspecified: Secondary | ICD-10-CM

## 2024-02-10 ENCOUNTER — Other Ambulatory Visit: Payer: Self-pay | Admitting: Internal Medicine

## 2024-02-10 ENCOUNTER — Ambulatory Visit (HOSPITAL_COMMUNITY)
Admission: RE | Admit: 2024-02-10 | Discharge: 2024-02-10 | Disposition: A | Discharge status: Home / Self Care | Source: Ambulatory Visit | Attending: Urology | Admitting: Urology

## 2024-02-10 DIAGNOSIS — N509 Disorder of male genital organs, unspecified: Secondary | ICD-10-CM | POA: Insufficient documentation

## 2024-02-10 DIAGNOSIS — N400 Enlarged prostate without lower urinary tract symptoms: Secondary | ICD-10-CM | POA: Diagnosis not present

## 2024-02-10 DIAGNOSIS — R972 Elevated prostate specific antigen [PSA]: Secondary | ICD-10-CM | POA: Diagnosis not present

## 2024-02-10 DIAGNOSIS — I739 Peripheral vascular disease, unspecified: Secondary | ICD-10-CM

## 2024-02-10 DIAGNOSIS — N4289 Other specified disorders of prostate: Secondary | ICD-10-CM | POA: Diagnosis not present

## 2024-02-10 DIAGNOSIS — N433 Hydrocele, unspecified: Secondary | ICD-10-CM | POA: Diagnosis not present

## 2024-02-10 MED ORDER — GADOBUTROL 1 MMOL/ML IV SOLN
6.0000 mL | Freq: Once | INTRAVENOUS | Status: AC | PRN
  Administered 2024-02-10: 6 mL via INTRAVENOUS

## 2024-02-18 ENCOUNTER — Ambulatory Visit (INDEPENDENT_AMBULATORY_CARE_PROVIDER_SITE_OTHER): Admitting: Urology

## 2024-02-18 ENCOUNTER — Encounter: Payer: Self-pay | Admitting: Urology

## 2024-02-18 DIAGNOSIS — R972 Elevated prostate specific antigen [PSA]: Secondary | ICD-10-CM | POA: Diagnosis not present

## 2024-02-18 NOTE — Patient Instructions (Signed)

## 2024-02-18 NOTE — Progress Notes (Signed)
 02/18/2024 9:53 AM   Chase Briggs 06/08/1951 [id number]  Referring provider: Meldon Sport, MD [address] [place],  [place] [address]  Elevated PSA   HPI: Chase Briggs is a 72yo here for followup for elevated PSA. MRi of the prostate 5/29 shows a left posterior lateral PIRADs 4 lesion 77mmx5mm. PSA 0.7. No lesion in his seminal vesicles. IPSS 6 QOl 1 on no BPH therapy. Uirne stream strong. No straining to urinate. No other complaints today   PMH: Past Medical History:  Diagnosis Date   Diabetes mellitus    Fatty liver    GERD (gastroesophageal reflux disease)    HTN (hypertension)    Hyperlipidemia    Insomnia    Renal insufficiency     Surgical History: Past Surgical History:  Procedure Laterality Date   ABDOMINAL AORTOGRAM W/LOWER EXTREMITY N/A 08/22/2018   Procedure: ABDOMINAL AORTOGRAM W/LOWER EXTREMITY;  Surgeon: Chase Hoof, MD;  Location: [facility] INVASIVE CV LAB;  Service: Cardiovascular;  Laterality: N/A;   BACK SURGERY     CARPAL TUNNEL WITH CUBITAL TUNNEL Right 03/21/2019   Procedure: RIGHT CARPAL TUNNEL AND CUBITAL  TUNNEL RELEASES;  Surgeon: Chase Sample, MD;  Location: [facility];  Service: Orthopedics;  Laterality: Right;  AXILLARY BLOCK   COLONOSCOPY  01/2007   Dr. Larrie Briggs, reviewed report, mild diverticulosis. Prep adequate. Next TCS due 01/2017   ESOPHAGOGASTRODUODENOSCOPY N/A 10/21/2012   [id number] GASTRITIS   MASS EXCISION Right 10/26/2012   Procedure: EXCISION MASS;  Surgeon: Chase Bound, MD;  Location: [facility];  Service: General;  Laterality: Right;   PERIPHERAL VASCULAR INTERVENTION  08/22/2018   Procedure: PERIPHERAL VASCULAR INTERVENTION;  Surgeon: Chase Hoof, MD;  Location: [facility] INVASIVE CV LAB;  Service: Cardiovascular;;  bilateral external iliac stents   scalp mass  4-5 yrs ago   Dr Chase Briggs NERVE TRANSPOSITION Right 03/21/2019   Procedure: DECOMPRESSION RIGHT ULNAR NERVE;  Surgeon: Chase Sample, MD;  Location: [facility];  Service: Orthopedics;  Laterality: Right;    Home Medications:  Allergies as of 02/18/2024   No Known Allergies      Medication List        Accurate as of February 18, 2024  9:53 AM. If you have any questions, ask your nurse or doctor.          albuterol  108 (90 Base) MCG/ACT inhaler Commonly known as: VENTOLIN  HFA INHALE 2 PUFFS INTO THE LUNGS EVERY 6 HOURS AS NEEDED FOR WHEEZING OR SHORTNESS OF BREATH   albuterol  108 (90 Base) MCG/ACT inhaler Commonly known as: VENTOLIN  HFA Inhale 2 puffs into the lungs every 4 (four) hours as needed for wheezing or shortness of breath.   amLODipine  10 MG tablet Commonly known as: NORVASC  TAKE 1 TABLET BY MOUTH DAILY   aspirin  EC 81 MG tablet Take 1 tablet (81 mg total) by mouth daily with breakfast.   atorvastatin  10 MG tablet Commonly known as: LIPITOR TAKE 1 TABLET(10 MG) BY MOUTH DAILY   azithromycin  250 MG tablet Commonly known as: ZITHROMAX  Take 1 tablet (250 mg total) by mouth daily. Take first 2 tablets together, then 1 every day until finished.   cloNIDine  0.1 MG tablet Commonly known as: CATAPRES  Take 1 tablet (0.1 mg total) by mouth 2 (two) times daily.   clopidogrel  75 MG tablet Commonly known as: PLAVIX  TAKE (1) TABLET BY MOUTH ONCE DAILY.   EPINEPHrine  0.3 mg/0.3 mL Soaj injection Commonly known as: EPI-PEN Inject  0.3 mg into the muscle as needed for anaphylaxis.   famotidine  20 MG tablet Commonly known as: Pepcid  Take 1 tablet (20 mg total) by mouth 2 (two) times daily.   ferrous sulfate  325 (65 FE) MG EC tablet Take 325 mg by mouth daily with breakfast.   glipiZIDE  2.5 MG 24 hr tablet Commonly known as: GLUCOTROL  XL TAKE 1 TABLET(2.5 MG) BY MOUTH DAILY WITH BREAKFAST   glucose blood test strip 1 each by Other route 2 (two) times daily. Use as instructed bid E11.65 Relion Premier   Lancets Misc 1 each by Does not apply route 2 (two) times daily. E11.65  Relion Premier   Lantus  SoloStar 100 UNIT/ML Solostar Pen Generic drug: insulin  glargine Inject 12 Units into the skin daily after breakfast.   lisinopril  40 MG tablet Commonly known as: ZESTRIL  Take 1 tablet (40 mg total) by mouth daily.   mirtazapine  15 MG tablet Commonly known as: REMERON  TAKE 1 TABLET(15 MG) BY MOUTH AT BEDTIME   mupirocin  ointment 2 % Commonly known as: BACTROBAN  Apply 1 Application topically 2 (two) times daily. Over upper lip area.   Sure Comfort Pen Needles 32G X 4 MM Misc Generic drug: Insulin  Pen Needle USE ONCE DAILY   Vitamin D  125 MCG (5000 UT) Caps Take 2,000 Units by mouth daily.        Allergies: No Known Allergies  Family History: Family History  Problem Relation Age of Onset   Diabetes Mother    Diabetes Father    Diabetes Brother    Colon cancer Neg Hx    Liver disease Neg Hx     Social History:  reports that he has been smoking cigarettes. He has a 17.5 pack-year smoking history. He has never used smokeless tobacco. He reports that he does not drink alcohol and does not use drugs.  ROS: All other review of systems were reviewed and are negative except what is noted above in HPI  Physical Exam: There were no vitals taken for this visit.  Constitutional:  Alert and oriented, No acute distress. HEENT: [place] AT, moist mucus membranes.  Trachea midline, no masses. Cardiovascular: No clubbing, cyanosis, or edema. Respiratory: Normal respiratory effort, no increased work of breathing. GI: Abdomen is soft, nontender, nondistended, no abdominal masses GU: No CVA tenderness.  Lymph: No cervical or inguinal lymphadenopathy. Skin: No rashes, bruises or suspicious lesions. Neurologic: Grossly intact, no focal deficits, moving all 4 extremities. Psychiatric: Normal mood and affect.  Laboratory Data: Lab Results  Component Value Date   WBC 9.6 07/29/2023   HGB 13.0 07/29/2023   HCT 40.1 07/29/2023   MCV 97.1 07/29/2023   PLT 290  07/29/2023    Lab Results  Component Value Date   CREATININE 1.09 08/23/2023    No results found for: "PSA"  No results found for: "TESTOSTERONE"  Lab Results  Component Value Date   HGBA1C 6.3 (H) 06/09/2023    Urinalysis    Component Value Date/Time   COLORURINE STRAW (A) 06/09/2023 1030   APPEARANCEUR Clear 02/02/2024 0904   LABSPEC 1.014 06/09/2023 1030   PHURINE 7.0 06/09/2023 1030   GLUCOSEU Negative 02/02/2024 0904   HGBUR NEGATIVE 06/09/2023 1030   BILIRUBINUR Negative 02/02/2024 0904   KETONESUR NEGATIVE 06/09/2023 1030   PROTEINUR Trace 02/02/2024 0904   PROTEINUR NEGATIVE 06/09/2023 1030   UROBILINOGEN 1.0 03/06/2022 0829   UROBILINOGEN 0.2 02/19/2014 1340   NITRITE Negative 02/02/2024 0904   NITRITE NEGATIVE 06/09/2023 1030   LEUKOCYTESUR Trace (A) 02/02/2024  1610   LEUKOCYTESUR NEGATIVE 06/09/2023 1030    Lab Results  Component Value Date   LABMICR See below: 02/02/2024   WBCUA 0-5 02/02/2024   LABEPIT 0-10 02/02/2024   MUCUS Present 09/23/2020   BACTERIA Many (A) 02/02/2024    Pertinent Imaging: MRI 02/10/24: Images reviewed and discussed with the patient  No results found for this or any previous visit.  Results for orders placed during the hospital encounter of 06/09/23  US  Venous Img Lower Bilateral (DVT)  Narrative CLINICAL DATA:  Shortness of breath. Bilateral lower extremity pain. Evaluate for DVT.  EXAM: BILATERAL LOWER EXTREMITY VENOUS DOPPLER ULTRASOUND  TECHNIQUE: Gray-scale sonography with graded compression, as well as color Doppler and duplex ultrasound were performed to evaluate the lower extremity deep venous systems from the level of the common femoral vein and including the common femoral, femoral, profunda femoral, popliteal and calf veins including the posterior tibial, peroneal and gastrocnemius veins when visible. The superficial great saphenous vein was also interrogated. Spectral Doppler was utilized to evaluate  flow at rest and with distal augmentation maneuvers in the common femoral, femoral and popliteal veins.  COMPARISON:  Bilateral lower extremity venous Doppler ultrasound-10/15/2022 (negative)  FINDINGS: RIGHT LOWER EXTREMITY  Common Femoral Vein: No evidence of thrombus. Normal compressibility, respiratory phasicity and response to augmentation.  Saphenofemoral Junction: No evidence of thrombus. Normal compressibility and flow on color Doppler imaging.  Profunda Femoral Vein: No evidence of thrombus. Normal compressibility and flow on color Doppler imaging.  Femoral Vein: No evidence of thrombus. Normal compressibility, respiratory phasicity and response to augmentation.  Popliteal Vein: No evidence of thrombus. Normal compressibility, respiratory phasicity and response to augmentation.  Calf Veins: No evidence of thrombus. Normal compressibility and flow on color Doppler imaging.  Superficial Great Saphenous Vein: No evidence of thrombus. Normal compressibility.  Other Findings:  None.  LEFT LOWER EXTREMITY  Common Femoral Vein: No evidence of thrombus. Normal compressibility, respiratory phasicity and response to augmentation.  Saphenofemoral Junction: No evidence of thrombus. Normal compressibility and flow on color Doppler imaging.  Profunda Femoral Vein: No evidence of thrombus. Normal compressibility and flow on color Doppler imaging.  Femoral Vein: No evidence of thrombus. Normal compressibility, respiratory phasicity and response to augmentation.  Popliteal Vein: No evidence of thrombus. Normal compressibility, respiratory phasicity and response to augmentation.  Calf Veins: No evidence of thrombus. Normal compressibility and flow on color Doppler imaging.  Superficial Great Saphenous Vein: No evidence of thrombus. Normal compressibility.  Other Findings:  None.  IMPRESSION: No evidence of DVT within either lower extremity.   Electronically  Signed By: Robbi Childs M.D. On: 06/11/2023 16:27  No results found for this or any previous visit.  No results found for this or any previous visit.  No results found for this or any previous visit.  No results found for this or any previous visit.  No results found for this or any previous visit.  Results for orders placed during the hospital encounter of 01/28/21  CT Renal Stone Study  Narrative CLINICAL DATA:  Left-sided flank pain and dysuria  EXAM: CT ABDOMEN AND PELVIS WITHOUT CONTRAST  TECHNIQUE: Multidetector CT imaging of the abdomen and pelvis was performed following the standard protocol without IV contrast.  COMPARISON:  10/30/2017  FINDINGS: Lower chest: Emphysematous changes are noted. No focal infiltrate or effusion is seen.  Hepatobiliary: Gallbladder is within normal limits. Area of focal fatty infiltration is noted along the falciform ligament. No other focal abnormality is noted.  Pancreas:  Unremarkable. No pancreatic ductal dilatation or surrounding inflammatory changes.  Spleen: Normal in size without focal abnormality.  Adrenals/Urinary Tract: Adrenal glands are within normal limits. Right kidney is well visualized without renal calculi or obstructive changes. The left kidney demonstrates some increased perinephric stranding. A few tiny left renal stones are noted. No stones are identified. The bladder is incompletely distended.  Stomach/Bowel: Scattered diverticular change of the colon is noted. The appendix is well visualized and within normal limits. Small bowel and stomach are unremarkable.  Vascular/Lymphatic: Aortic atherosclerosis. No enlarged abdominal or pelvic lymph nodes.  Reproductive: Prostate is unremarkable.  Other: No abdominal wall hernia or abnormality. No abdominopelvic ascites.  Musculoskeletal: Degenerative changes of lumbar spine are noted.  IMPRESSION: A few tiny nonobstructing left renal calculi.  Mild  diverticular change without diverticulitis.  Mild fatty infiltration of the liver along the falciform ligament.  Aortic Atherosclerosis (ICD10-I70.0) and Emphysema (ICD10-J43.9).   Electronically Signed By: Violeta Grey M.D. On: 01/28/2021 15:18   Assessment & Plan:    1. Elevated PSA (Primary) -followup 6 months with PSA   No follow-ups on file.  Johnie Nailer, MD  [facility] [place]

## 2024-02-28 ENCOUNTER — Encounter: Payer: Self-pay | Admitting: "Endocrinology

## 2024-02-28 ENCOUNTER — Ambulatory Visit (INDEPENDENT_AMBULATORY_CARE_PROVIDER_SITE_OTHER): Payer: Medicare Other | Admitting: "Endocrinology

## 2024-02-28 VITALS — BP 132/58 | HR 52 | Ht 67.0 in | Wt 128.8 lb

## 2024-02-28 DIAGNOSIS — E1169 Type 2 diabetes mellitus with other specified complication: Secondary | ICD-10-CM

## 2024-02-28 DIAGNOSIS — I10 Essential (primary) hypertension: Secondary | ICD-10-CM | POA: Diagnosis not present

## 2024-02-28 DIAGNOSIS — Z794 Long term (current) use of insulin: Secondary | ICD-10-CM

## 2024-02-28 DIAGNOSIS — E782 Mixed hyperlipidemia: Secondary | ICD-10-CM

## 2024-02-28 LAB — POCT GLYCOSYLATED HEMOGLOBIN (HGB A1C): HbA1c, POC (controlled diabetic range): 6.3 % (ref 0.0–7.0)

## 2024-02-28 NOTE — Progress Notes (Signed)
 02/28/2024   Endocrinology follow-up note   Subjective:    Patient ID: Chase Briggs, male    DOB: [birth year], PCP Meldon Sport, MD   Past Medical History:  Diagnosis Date   Diabetes mellitus    Fatty liver    GERD (gastroesophageal reflux disease)    HTN (hypertension)    Hyperlipidemia    Insomnia    Renal insufficiency    Past Surgical History:  Procedure Laterality Date   ABDOMINAL AORTOGRAM W/LOWER EXTREMITY N/A 08/22/2018   Procedure: ABDOMINAL AORTOGRAM W/LOWER EXTREMITY;  Surgeon: Adine Hoof, MD;  Location: [facility] INVASIVE CV LAB;  Service: Cardiovascular;  Laterality: N/A;   BACK SURGERY     CARPAL TUNNEL WITH CUBITAL TUNNEL Right 03/21/2019   Procedure: RIGHT CARPAL TUNNEL AND CUBITAL  TUNNEL RELEASES;  Surgeon: Lyanne Sample, MD;  Location: [facility];  Service: Orthopedics;  Laterality: Right;  AXILLARY BLOCK   COLONOSCOPY  01/2007   Dr. Larrie Po, reviewed report, mild diverticulosis. Prep adequate. Next TCS due 01/2017   ESOPHAGOGASTRODUODENOSCOPY N/A 10/21/2012   [id number] GASTRITIS   MASS EXCISION Right 10/26/2012   Procedure: EXCISION MASS;  Surgeon: Beau Bound, MD;  Location: [facility];  Service: General;  Laterality: Right;   PERIPHERAL VASCULAR INTERVENTION  08/22/2018   Procedure: PERIPHERAL VASCULAR INTERVENTION;  Surgeon: Adine Hoof, MD;  Location: [facility] INVASIVE CV LAB;  Service: Cardiovascular;;  bilateral external iliac stents   scalp mass  4-5 yrs ago   Dr Hervey Loser NERVE TRANSPOSITION Right 03/21/2019   Procedure: DECOMPRESSION RIGHT ULNAR NERVE;  Surgeon: Lyanne Sample, MD;  Location:  SURGERY CENTER;  Service: Orthopedics;  Laterality: Right;   Social History   Socioeconomic History   Marital status: Married    Spouse name: Not on file   Number of children: 2   Years of education: Not on file   Highest education level: Not on file  Occupational History   Occupation: reitred    Comment:  [employer], heavy lifting  Tobacco Use   Smoking status: Every Day    Current packs/day: 0.50    Average packs/day: 0.5 packs/day for 35.0 years (17.5 ttl pk-yrs)    Types: Cigarettes   Smokeless tobacco: Never  Vaping Use   Vaping status: Never Used  Substance and Sexual Activity   Alcohol use: No    Comment: hx heavy etoh x 3 yrs, quit 30+ yrs ago   Drug use: No   Sexual activity: Yes    Birth control/protection: None  Other Topics Concern   Not on file  Social History Narrative   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 Highest level of education: 12th grade. Married for 12 years. Has 2 sons (33 and 21). Lives with wife.   Social Drivers of Corporate investment banker Strain: Not on file  Food Insecurity: No Food Insecurity (06/09/2023)   Hunger Vital Sign    Worried About Running Out of Food in the Last Year: Never true    Ran Out of Food in the Last Year: Never true  Transportation Needs: No Transportation Needs (06/09/2023)   PRAPARE - Administrator, Civil Service (Medical): No    Lack of Transportation (Non-Medical): No  Physical Activity: Not on file  Stress: Not on file  Social Connections: Not on file   Outpatient Encounter Medications as of 02/28/2024  Medication Sig   albuterol  (VENTOLIN  HFA) 108 (90 Base) MCG/ACT inhaler INHALE 2 PUFFS INTO  THE LUNGS EVERY 6 HOURS AS NEEDED FOR WHEEZING OR SHORTNESS OF BREATH   albuterol  (VENTOLIN  HFA) 108 (90 Base) MCG/ACT inhaler Inhale 2 puffs into the lungs every 4 (four) hours as needed for wheezing or shortness of breath.   amLODipine  (NORVASC ) 10 MG tablet TAKE 1 TABLET BY MOUTH DAILY   aspirin  EC 81 MG tablet Take 1 tablet (81 mg total) by mouth daily with breakfast.   atorvastatin  (LIPITOR) 10 MG tablet TAKE 1 TABLET(10 MG) BY MOUTH DAILY   azithromycin  (ZITHROMAX ) 250 MG tablet Take 1 tablet (250 mg total) by mouth daily. Take first 2 tablets together, then 1 every day until finished. (Patient not taking: Reported  on 02/28/2024)   Cholecalciferol (VITAMIN D ) 125 MCG (5000 UT) CAPS Take 2,000 Units by mouth daily.   cloNIDine  (CATAPRES ) 0.1 MG tablet Take 1 tablet (0.1 mg total) by mouth 2 (two) times daily.   clopidogrel  (PLAVIX ) 75 MG tablet TAKE (1) TABLET BY MOUTH ONCE DAILY.   EPINEPHrine  0.3 mg/0.3 mL IJ SOAJ injection Inject 0.3 mg into the muscle as needed for anaphylaxis.   famotidine  (PEPCID ) 20 MG tablet Take 1 tablet (20 mg total) by mouth 2 (two) times daily.   ferrous sulfate  325 (65 FE) MG EC tablet Take 325 mg by mouth daily with breakfast.   glipiZIDE  (GLUCOTROL  XL) 2.5 MG 24 hr tablet TAKE 1 TABLET(2.5 MG) BY MOUTH DAILY WITH BREAKFAST   glucose blood test strip 1 each by Other route 2 (two) times daily. Use as instructed bid E11.65 Relion Premier   insulin  glargine (LANTUS  SOLOSTAR) 100 UNIT/ML Solostar Pen Inject 12 Units into the skin daily after breakfast.   Lancets MISC 1 each by Does not apply route 2 (two) times daily. E11.65 Relion Premier   lisinopril  (ZESTRIL ) 40 MG tablet Take 1 tablet (40 mg total) by mouth daily.   mirtazapine  (REMERON ) 15 MG tablet TAKE 1 TABLET(15 MG) BY MOUTH AT BEDTIME   mupirocin  ointment (BACTROBAN ) 2 % Apply 1 Application topically 2 (two) times daily. Over upper lip area.   SURE COMFORT PEN NEEDLES 32G X 4 MM MISC USE ONCE DAILY   No facility-administered encounter medications on file as of 02/28/2024.   ALLERGIES: No Known Allergies VACCINATION STATUS: Immunization History  Administered Date(s) Administered   Fluad Quad(high Dose 65+) 08/27/2021, 07/24/2022   Fluad Trivalent(High Dose 65+) 06/08/2023   Moderna Sars-Covid-2 Vaccination 01/04/2020, 02/06/2020   PNEUMOCOCCAL CONJUGATE-20 07/24/2022    Diabetes He presents for his follow-up diabetic visit. He has type 2 diabetes mellitus. Onset time: He was diagnosed at approximate age of 35 years. His disease course has been stable. There are no hypoglycemic associated symptoms. Pertinent  negatives for hypoglycemia include no confusion, headaches, pallor or seizures. Pertinent negatives for diabetes include no chest pain, no fatigue, no polydipsia, no polyphagia, no polyuria and no weakness. There are no hypoglycemic complications. Symptoms are stable (He recently lost control of his glycemia due to exposure to heavy dose steroids.). Risk factors for coronary artery disease include diabetes mellitus, dyslipidemia, hypertension, male sex, sedentary lifestyle and tobacco exposure. He is compliant with treatment most of the time. His weight is fluctuating minimally. He is following a generally unhealthy diet. When asked about meal planning, he reported none. He has not had a previous visit with a dietitian. His home blood glucose trend is fluctuating minimally. His breakfast blood glucose range is generally 90-110 mg/dl. His bedtime blood glucose range is generally 110-130 mg/dl. His overall blood glucose range  is 110-130 mg/dl. Chase Briggs presents with controlled glycemic profile both fasting and postprandial.  His point-of-care A1c is 6.3%. He did not document or report major hypoglycemia.    ) An ACE inhibitor/angiotensin II receptor blocker is being taken. Eye exam is current.  Hypertension This is a chronic problem. The current episode started more than 1 year ago. The problem has been gradually improving since onset. The problem is uncontrolled. Pertinent negatives include no chest pain, headaches, neck pain, palpitations or shortness of breath. Risk factors for coronary artery disease include smoking/tobacco exposure, sedentary lifestyle, dyslipidemia, family history and diabetes mellitus. Past treatments include ACE inhibitors, calcium  channel blockers and central alpha agonists. The current treatment provides no improvement. Compliance problems include psychosocial issues.  Hypertensive end-organ damage includes kidney disease. Identifiable causes of hypertension include chronic renal disease.   Hyperlipidemia This is a chronic problem. The current episode started more than 1 year ago. Exacerbating diseases include chronic renal disease and diabetes. Pertinent negatives include no chest pain, myalgias or shortness of breath. Current antihyperlipidemic treatment includes statins. Compliance problems include psychosocial issues.  Risk factors for coronary artery disease include dyslipidemia, diabetes mellitus, male sex, hypertension and a sedentary lifestyle.     Objective:    BP (!) 132/58   Pulse (!) 52   Ht 5' 7 (1.702 m)   Wt 128 lb 12.8 oz (58.4 kg)   BMI 20.17 kg/m   Wt Readings from Last 3 Encounters:  02/28/24 128 lb 12.8 oz (58.4 kg)  02/02/24 128 lb 6.4 oz (58.2 kg)  12/28/23 129 lb (58.5 kg)     Physical Exam- Limited  Constitutional:  Body mass index is 20.17 kg/m. , not in acute distress, normal state of mind  Reluctant affect, not ready to quit smoking  Complete Blood Count (Most recent): Lab Results  Component Value Date   WBC 9.6 07/29/2023   HGB 13.0 07/29/2023   HCT 40.1 07/29/2023   MCV 97.1 07/29/2023   PLT 290 07/29/2023   Chemistry (most recent): Lab Results  Component Value Date   NA 139 08/23/2023   K 4.4 08/23/2023   CL 100 08/23/2023   CO2 25 08/23/2023   BUN 8 08/23/2023   CREATININE 1.09 08/23/2023   Diabetic Labs (most recent): Lab Results  Component Value Date   HGBA1C 6.3 02/28/2024   HGBA1C 6.3 (H) 06/09/2023   HGBA1C 6.6 02/25/2023   MICROALBUR 150 08/23/2020   MICROALBUR 9.9 08/20/2017   Lipid Panel     Component Value Date/Time   CHOL 87 (L) 08/23/2023 0805   TRIG 36 08/23/2023 0805   HDL 40 08/23/2023 0805   CHOLHDL 2.2 08/23/2023 0805   CHOLHDL 2.1 04/23/2022 0142   VLDL 4 04/23/2022 0142   LDLCALC 36 08/23/2023 0805   LDLCALC 56 12/12/2019 0905     Assessment & Plan:   1. Type 2 diabetes mellitus with microalbuminuria , with long-term current use of insulin  (HCC)  Chase Briggs presents with controlled  glycemic profile both fasting and postprandial.  His point-of-care A1c is 6.3%. He did not document or report major hypoglycemia.    He does not have any hypoglycemic episodes.  He is a chronic heavy smoker, remains at a high risk for more acute and chronic complications of diabetes which include CAD, CVA, CKD, retinopathy, and neuropathy. These are all discussed in detail with the patient.    Recent labs reviewed, stage 3 renal insufficiency. - I have re-counseled the patient on diet management  by adopting  a carbohydrate restricted / protein rich  Diet.  - he acknowledges that there is a room for improvement in his food and drink choices. - Suggestion is made for him to avoid simple carbohydrates  from his diet including Cakes, Sweet Desserts, Ice Cream, Soda (diet and regular), Sweet Tea, Candies, Chips, Cookies, Store Bought Juices, Alcohol in Excess of  1-2 drinks a day, Artificial Sweeteners,  Coffee Creamer, and Sugar-free Products, Lemonade. This will help patient to have more stable blood glucose profile and potentially avoid unintended weight gain.   - Patient is advised to stick to a routine mealtimes to eat 3 meals  a day and avoid unnecessary snacks ( to snack only to correct hypoglycemia).  - I have approached patient with the following individualized plan to manage diabetes and patient agrees.  - He has benefited from insulin  initiation.   - Based on  his presentation with controlled glycemic profile, he will not need prandial insulin  for now.  He is advised to continue Lantus  12 units daily with breakfast.     He is encouraged and agrees to to continue monitoring blood glucose at least twice a day-daily before breakfast and at bedtime.    - He will call clinic if he registers blood glucose readings less than 70 or greater than 200 mg/dL.  -   He has stage 3 renal insufficiency which has improved over time, will stay off of metformin for now.   -He is benefiting from  low-dose glipizide .  He is advised to continue glipizide  2.5 mg XL p.o. daily at breakfast.   Sufficiency continues, he will need consultation with nephrology.    - Patient is not a candidate for  incretin therapy, nor SGLT2 inhibitors. - Patient specific target  for A1c; LDL, HDL, Triglycerides, were discussed in detail.  2) BP/HTN:  His blood pressure is controlled to target. He has 3 medications including losartan, clonidine , and amlodipine .  He continues to smoke heavily.   The patient was counseled on the dangers of tobacco use, and was advised to quit.  Reviewed strategies to maximize success, including removing cigarettes and smoking materials from environment.  3) Lipids/HPL: His most recent lipid panel showed LDL controlled at 36.  He is not on statins for now.     4)  Weight/Diet: His BMI is 19.45-he is not a candidate for weight loss.     - He would benefit from addition of more complex carbohydrates and protein intake, information provided. CDE consult is requested.  5. Vitamin D  deficiency  - He he is on ongoing supplement with vitamin D3 5000 units daily.    6) Chronic Care/Health Maintenance:  -Patient is on ACEI/ARB and Statin medications and encouraged to continue to follow up with Ophthalmology, Podiatrist at least yearly or according to recommendations, and advised to  Quit  smoking. I have recommended yearly flu vaccine and pneumonia vaccination at least every 5 years; moderate intensity exercise for up to 150 minutes weekly; and  sleep for at least 7 hours a day. -He is counseled extensively for smoking cessation. The patient was counseled on the dangers of tobacco use, and was advised to quit.  Reviewed strategies to maximize success, including removing cigarettes and smoking materials from environment.   - I advised patient to maintain close follow up with Meldon Sport, MD for primary care needs.  - I have advised him to avoid over-the-counter pain  medications to avoid additional risk for renal insufficiency.   I spent  26  minutes in the care of the patient today including review of labs from CMP, Lipids, Thyroid  Function, Hematology (current and previous including abstractions from other facilities); face-to-face time discussing  his blood glucose readings/logs, discussing hypoglycemia and hyperglycemia episodes and symptoms, medications doses, his options of short and long term treatment based on the latest standards of care / guidelines;  discussion about incorporating lifestyle medicine;  and documenting the encounter. Risk reduction counseling performed per USPSTF guidelines to reduce cardiovascular risk factors.     Please refer to Patient Instructions for Blood Glucose Monitoring and Insulin /Medications Dosing Guide  in media tab for additional information. Please  also refer to  Patient Self Inventory in the Media  tab for reviewed elements of pertinent patient history.  Chase Briggs participated in the discussions, expressed understanding, and voiced agreement with the above plans.  All questions were answered to his satisfaction. he is encouraged to contact clinic should he have any questions or concerns prior to his return visit.    Follow up plan: -Return in about 6 months (around 08/29/2024) for F/U with Pre-visit Labs, Meter/CGM/Logs, A1c here.  Chase Orf, MD Phone: [phone]  Fax: [phone]  This note was partially dictated with voice recognition software. Similar sounding words can be transcribed inadequately or may not  be corrected upon review.  02/28/2024, 11:19 AM

## 2024-02-29 ENCOUNTER — Ambulatory Visit (INDEPENDENT_AMBULATORY_CARE_PROVIDER_SITE_OTHER): Payer: Medicare Other | Admitting: Internal Medicine

## 2024-02-29 ENCOUNTER — Encounter: Payer: Self-pay | Admitting: Internal Medicine

## 2024-02-29 VITALS — BP 134/72 | HR 73 | Ht 67.0 in | Wt 128.8 lb

## 2024-02-29 DIAGNOSIS — I491 Atrial premature depolarization: Secondary | ICD-10-CM | POA: Diagnosis not present

## 2024-02-29 DIAGNOSIS — E1169 Type 2 diabetes mellitus with other specified complication: Secondary | ICD-10-CM

## 2024-02-29 DIAGNOSIS — I10 Essential (primary) hypertension: Secondary | ICD-10-CM

## 2024-02-29 DIAGNOSIS — J432 Centrilobular emphysema: Secondary | ICD-10-CM | POA: Diagnosis not present

## 2024-02-29 DIAGNOSIS — Z794 Long term (current) use of insulin: Secondary | ICD-10-CM

## 2024-02-29 DIAGNOSIS — I739 Peripheral vascular disease, unspecified: Secondary | ICD-10-CM

## 2024-02-29 DIAGNOSIS — G4700 Insomnia, unspecified: Secondary | ICD-10-CM | POA: Diagnosis not present

## 2024-02-29 NOTE — Patient Instructions (Signed)
Please continue to take medications as prescribed.  Please continue to follow low salt diet and perform moderate exercise/walking at least 150 mins/week. 

## 2024-02-29 NOTE — Assessment & Plan Note (Signed)
 Overall well-controlled, has mild dyspnea on exertion On Albuterol PRN for dyspnea or wheezing

## 2024-02-29 NOTE — Assessment & Plan Note (Signed)
 S/p right and left iliac stent placement Followed by vascular surgery and cardiology On aspirin, Plavix and statin

## 2024-02-29 NOTE — Progress Notes (Signed)
 Established Patient Office Visit  Subjective:  Patient ID: Chase Briggs, male    DOB: 12-Jul-1951  Age: 73 y.o. MRN: [id number]  CC:  Chief Complaint  Patient presents with   Medical Management of Chronic Issues    4 MONTH F/U    HPI Chase Briggs is a 73 y.o. male with past medical history of HTN, PAD, allergic rhinitis, type II DM, HLD, insomnia, anemia and tobacco abuse who presents for f/u of his chronic medical conditions.  HTN: His blood pressure was wnl today.  He takes amlodipine  10 mg QD, lisinopril  40 mg QD and clonidine  0.1 mg BID.  Denies any chest pain currently.  He reports intermittent exertional dyspnea and palpitations.  Followed by cardiology as well.  He has been taking Remeron  for improving appetite and sleep quality.  He still has not started protein supplement.  His appetite has improved, but he reports that he is picky eater. He has gained about 6 lbs since 11/24.  Prostatic mass: He had biopsy of prostate in 11/24, which was benign.  He denies any dysuria, hematuria, urinary hesitancy or resistance.  PAD: Has had right and left iliac stent placement, followed by vascular surgery.  He takes aspirin , Plavix  and statin.  Denies any recent worsening of claudication symptoms.   Type II DM with HLD: He takes Lantus  12 units in the morning and glipizide  2.5 mg QD, followed by Dr. Monte Antonio. HbA1c is 6.3 now.  Denies any fatigue, polyuria or polydipsia.  He takes Lipitor for HLD. Denies any fever, chills, chronic cough, hemoptysis, or night sweats.    Past Medical History:  Diagnosis Date   Diabetes mellitus    Fatty liver    GERD (gastroesophageal reflux disease)    HTN (hypertension)    Hyperlipidemia    Insomnia    Renal insufficiency     Past Surgical History:  Procedure Laterality Date   ABDOMINAL AORTOGRAM W/LOWER EXTREMITY N/A 08/22/2018   Procedure: ABDOMINAL AORTOGRAM W/LOWER EXTREMITY;  Surgeon: Adine Hoof, MD;  Location: [facility]  INVASIVE CV LAB;  Service: Cardiovascular;  Laterality: N/A;   BACK SURGERY     CARPAL TUNNEL WITH CUBITAL TUNNEL Right 03/21/2019   Procedure: RIGHT CARPAL TUNNEL AND CUBITAL  TUNNEL RELEASES;  Surgeon: Lyanne Sample, MD;  Location: [facility];  Service: Orthopedics;  Laterality: Right;  AXILLARY BLOCK   COLONOSCOPY  01/2007   Dr. Larrie Po, reviewed report, mild diverticulosis. Prep adequate. Next TCS due 01/2017   ESOPHAGOGASTRODUODENOSCOPY N/A 10/21/2012   [id number] GASTRITIS   MASS EXCISION Right 10/26/2012   Procedure: EXCISION MASS;  Surgeon: Beau Bound, MD;  Location: [facility];  Service: General;  Laterality: Right;   PERIPHERAL VASCULAR INTERVENTION  08/22/2018   Procedure: PERIPHERAL VASCULAR INTERVENTION;  Surgeon: Adine Hoof, MD;  Location: [facility] INVASIVE CV LAB;  Service: Cardiovascular;;  bilateral external iliac stents   scalp mass  4-5 yrs ago   Dr Hervey Loser NERVE TRANSPOSITION Right 03/21/2019   Procedure: DECOMPRESSION RIGHT ULNAR NERVE;  Surgeon: Lyanne Sample, MD;  Location: [facility];  Service: Orthopedics;  Laterality: Right;    Family History  Problem Relation Age of Onset   Diabetes Mother    Diabetes Father    Diabetes Brother    Colon cancer Neg Hx    Liver disease Neg Hx     Social History   Socioeconomic History   Marital status: Married    Spouse name: Not on  file   Number of children: 2   Years of education: Not on file   Highest education level: Not on file  Occupational History   Occupation: reitred    Comment: [employer], heavy lifting  Tobacco Use   Smoking status: Every Day    Current packs/day: 0.50    Average packs/day: 0.5 packs/day for 35.0 years (17.5 ttl pk-yrs)    Types: Cigarettes   Smokeless tobacco: Never  Vaping Use   Vaping status: Never Used  Substance and Sexual Activity   Alcohol use: No    Comment: hx heavy etoh x 3 yrs, quit 30+ yrs ago   Drug use: No   Sexual activity: Yes    Birth  control/protection: None  Other Topics Concern   Not on file  Social History Narrative   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 Highest level of education: 12th grade. Married for 12 years. Has 2 sons (33 and 21). Lives with wife.   Social Drivers of Corporate investment banker Strain: Not on file  Food Insecurity: No Food Insecurity (06/09/2023)   Hunger Vital Sign    Worried About Running Out of Food in the Last Year: Never true    Ran Out of Food in the Last Year: Never true  Transportation Needs: No Transportation Needs (06/09/2023)   PRAPARE - Administrator, Civil Service (Medical): No    Lack of Transportation (Non-Medical): No  Physical Activity: Not on file  Stress: Not on file  Social Connections: Not on file  Intimate Partner Violence: Not At Risk (06/09/2023)   Humiliation, Afraid, Rape, and Kick questionnaire    Fear of Current or Ex-Partner: No    Emotionally Abused: No    Physically Abused: No    Sexually Abused: No    Outpatient Medications Prior to Visit  Medication Sig Dispense Refill   albuterol  (VENTOLIN  HFA) 108 (90 Base) MCG/ACT inhaler INHALE 2 PUFFS INTO THE LUNGS EVERY 6 HOURS AS NEEDED FOR WHEEZING OR SHORTNESS OF BREATH 18 g 0   albuterol  (VENTOLIN  HFA) 108 (90 Base) MCG/ACT inhaler Inhale 2 puffs into the lungs every 4 (four) hours as needed for wheezing or shortness of breath. 18 g 0   amLODipine  (NORVASC ) 10 MG tablet TAKE 1 TABLET BY MOUTH DAILY 90 tablet 3   aspirin  EC 81 MG tablet Take 1 tablet (81 mg total) by mouth daily with breakfast. 30 tablet 11   atorvastatin  (LIPITOR) 10 MG tablet TAKE 1 TABLET(10 MG) BY MOUTH DAILY 90 tablet 1   Cholecalciferol (VITAMIN D ) 125 MCG (5000 UT) CAPS Take 2,000 Units by mouth daily. 30 capsule 3   cloNIDine  (CATAPRES ) 0.1 MG tablet Take 1 tablet (0.1 mg total) by mouth 2 (two) times daily. 180 tablet 3   clopidogrel  (PLAVIX ) 75 MG tablet TAKE (1) TABLET BY MOUTH ONCE DAILY. 90 tablet 3   EPINEPHrine   0.3 mg/0.3 mL IJ SOAJ injection Inject 0.3 mg into the muscle as needed for anaphylaxis. 1 each 1   famotidine  (PEPCID ) 20 MG tablet Take 1 tablet (20 mg total) by mouth 2 (two) times daily. 60 tablet 2   ferrous sulfate  325 (65 FE) MG EC tablet Take 325 mg by mouth daily with breakfast.     glipiZIDE  (GLUCOTROL  XL) 2.5 MG 24 hr tablet TAKE 1 TABLET(2.5 MG) BY MOUTH DAILY WITH BREAKFAST 90 tablet 0   glucose blood test strip 1 each by Other route 2 (two) times daily. Use as instructed bid  E11.65 Relion Premier 100 each 5   insulin  glargine (LANTUS  SOLOSTAR) 100 UNIT/ML Solostar Pen Inject 12 Units into the skin daily after breakfast. 15 mL 0   Lancets MISC 1 each by Does not apply route 2 (two) times daily. E11.65 Relion Premier 100 each 5   lisinopril  (ZESTRIL ) 40 MG tablet Take 1 tablet (40 mg total) by mouth daily. 90 tablet 1   mirtazapine  (REMERON ) 15 MG tablet TAKE 1 TABLET(15 MG) BY MOUTH AT BEDTIME 30 tablet 3   mupirocin  ointment (BACTROBAN ) 2 % Apply 1 Application topically 2 (two) times daily. Over upper lip area. 22 g 0   SURE COMFORT PEN NEEDLES 32G X 4 MM MISC USE ONCE DAILY 100 each 3   azithromycin  (ZITHROMAX ) 250 MG tablet Take 1 tablet (250 mg total) by mouth daily. Take first 2 tablets together, then 1 every day until finished. 6 tablet 0   No facility-administered medications prior to visit.    No Known Allergies  ROS Review of Systems  Constitutional:  Positive for fatigue. Negative for chills and fever.  HENT:  Negative for congestion, postnasal drip and sore throat.   Respiratory:  Negative for cough and shortness of breath.   Cardiovascular:  Positive for palpitations. Negative for chest pain.  Gastrointestinal:  Positive for constipation. Negative for diarrhea and vomiting.  Genitourinary:  Negative for dysuria and hematuria.  Musculoskeletal:  Positive for back pain. Negative for neck pain and neck stiffness.  Skin:  Negative for rash.  Neurological:  Negative  for dizziness and weakness.  Psychiatric/Behavioral:  Positive for sleep disturbance. Negative for agitation and behavioral problems.       Objective:    Physical Exam Vitals reviewed.  Constitutional:      General: He is not in acute distress.    Appearance: He is not diaphoretic.  HENT:     Head: Normocephalic and atraumatic.     Nose: No congestion.     Mouth/Throat:     Mouth: Mucous membranes are moist.   Eyes:     General: No scleral icterus.    Extraocular Movements: Extraocular movements intact.    Cardiovascular:     Rate and Rhythm: Normal rate. Rhythm irregular.     Heart sounds: Normal heart sounds. No murmur heard. Pulmonary:     Breath sounds: Normal breath sounds. No wheezing or rales.   Musculoskeletal:     Cervical back: Neck supple. No tenderness.     Right lower leg: No edema.     Left lower leg: No edema.   Skin:    General: Skin is warm.     Findings: No rash.     Comments: Clubbing of fingernails   Neurological:     General: No focal deficit present.     Mental Status: He is alert and oriented to person, place, and time.   Psychiatric:        Mood and Affect: Mood normal.        Behavior: Behavior normal.     BP 134/72 (BP Location: Left Arm)   Pulse 73   Ht 5' 7 (1.702 m)   Wt 128 lb 12.8 oz (58.4 kg)   SpO2 91%   BMI 20.17 kg/m  Wt Readings from Last 3 Encounters:  02/29/24 128 lb 12.8 oz (58.4 kg)  02/28/24 128 lb 12.8 oz (58.4 kg)  02/02/24 128 lb 6.4 oz (58.2 kg)    Lab Results  Component Value Date   TSH 0.384 (L) 08/23/2023  Lab Results  Component Value Date   WBC 9.6 07/29/2023   HGB 13.0 07/29/2023   HCT 40.1 07/29/2023   MCV 97.1 07/29/2023   PLT 290 07/29/2023   Lab Results  Component Value Date   NA 139 08/23/2023   K 4.4 08/23/2023   CO2 25 08/23/2023   GLUCOSE 165 (H) 08/23/2023   BUN 8 08/23/2023   CREATININE 1.09 08/23/2023   BILITOT 0.3 08/23/2023   ALKPHOS 136 (H) 08/23/2023   AST 15  08/23/2023   ALT 10 08/23/2023   PROT 7.6 08/23/2023   ALBUMIN 3.9 08/23/2023   CALCIUM  9.5 08/23/2023   ANIONGAP 11 07/29/2023   EGFR 72 08/23/2023   Lab Results  Component Value Date   CHOL 87 (L) 08/23/2023   Lab Results  Component Value Date   HDL 40 08/23/2023   Lab Results  Component Value Date   LDLCALC 36 08/23/2023   Lab Results  Component Value Date   TRIG 36 08/23/2023   Lab Results  Component Value Date   CHOLHDL 2.2 08/23/2023   Lab Results  Component Value Date   HGBA1C 6.3 02/28/2024      Assessment & Plan:   Problem List Items Addressed This Visit       Cardiovascular and Mediastinum   Essential hypertension - Primary   BP Readings from Last 1 Encounters:  02/29/24 134/72   Well-controlled with amlodipine  10 mg QD, lisinopril  40 mg QD and clonidine  0.1 mg BID Followed by cardiology Counseled for compliance with the medications Advised DASH diet and moderate exercise/walking, at least 150 mins/week      Relevant Orders   EKG 12-Lead (Completed)   CMP14+EGFR   CBC   PAD (peripheral artery disease) (HCC)   S/p right and left iliac stent placement Followed by vascular surgery and cardiology On aspirin , Plavix  and statin      PAC (premature atrial contraction)   Irregular heart beats on physical exam EKG shows PACs.  No signs of active ischemia. Advised to discuss with cardiology - may need cardiac monitor to evaluate PAC burden, rule out SVT      Relevant Orders   TSH + free T4     Respiratory   Centrilobular emphysema (HCC)   Overall well-controlled, has mild dyspnea on exertion On Albuterol  PRN for dyspnea or wheezing        Endocrine   Type 2 diabetes mellitus with other specified complication (HCC)   Lab Results  Component Value Date   HGBA1C 6.3 02/28/2024   Associated with HTN and PAD Well-controlled with Lantus  12 units every morning and Glipizide  2.5 mg QD, followed by Dr. Monte Antonio Advised to follow diabetic diet On  statin and ACEi F/u CMP and lipid panel Diabetic eye exam: Advised to follow up with Ophthalmology for diabetic eye exam      Relevant Orders   CMP14+EGFR     Other   Insomnia   On Remeron  15 mg at bedtime - has taken it in the past, can also help with appetite Sleep hygiene material provided Has tried trazodone  100 mg nightly, still had difficulty maintaining sleep        No orders of the defined types were placed in this encounter.   Follow-up: Return in about 5 months (around 07/31/2024) for HTN and insomnia.    Meldon Sport, MD

## 2024-02-29 NOTE — Assessment & Plan Note (Signed)
 Lab Results  Component Value Date   HGBA1C 6.3 02/28/2024   Associated with HTN and PAD Well-controlled with Lantus  12 units every morning and Glipizide  2.5 mg QD, followed by Dr. Monte Antonio Advised to follow diabetic diet On statin and ACEi F/u CMP and lipid panel Diabetic eye exam: Advised to follow up with Ophthalmology for diabetic eye exam

## 2024-02-29 NOTE — Assessment & Plan Note (Addendum)
 Irregular heart beats on physical exam EKG shows PACs.  No signs of active ischemia. Advised to discuss with cardiology - may need cardiac monitor to evaluate PAC burden, rule out SVT

## 2024-02-29 NOTE — Assessment & Plan Note (Signed)
 On Remeron  15 mg at bedtime - has taken it in the past, can also help with appetite Sleep hygiene material provided Has tried trazodone  100 mg nightly, still had difficulty maintaining sleep

## 2024-02-29 NOTE — Assessment & Plan Note (Addendum)
 BP Readings from Last 1 Encounters:  02/29/24 134/72   Well-controlled with amlodipine  10 mg QD, lisinopril  40 mg QD and clonidine  0.1 mg BID Followed by cardiology Counseled for compliance with the medications Advised DASH diet and moderate exercise/walking, at least 150 mins/week

## 2024-03-01 ENCOUNTER — Ambulatory Visit: Payer: Self-pay | Admitting: Internal Medicine

## 2024-03-01 LAB — CMP14+EGFR
ALT: 9 IU/L (ref 0–44)
AST: 14 IU/L (ref 0–40)
Albumin: 4.1 g/dL (ref 3.8–4.8)
Alkaline Phosphatase: 129 IU/L — ABNORMAL HIGH (ref 44–121)
BUN/Creatinine Ratio: 8 — ABNORMAL LOW (ref 10–24)
BUN: 9 mg/dL (ref 8–27)
Bilirubin Total: 0.5 mg/dL (ref 0.0–1.2)
CO2: 23 mmol/L (ref 20–29)
Calcium: 9.9 mg/dL (ref 8.6–10.2)
Chloride: 101 mmol/L (ref 96–106)
Creatinine, Ser: 1.16 mg/dL (ref 0.76–1.27)
Globulin, Total: 3.7 g/dL (ref 1.5–4.5)
Glucose: 134 mg/dL — ABNORMAL HIGH (ref 70–99)
Potassium: 4.8 mmol/L (ref 3.5–5.2)
Sodium: 140 mmol/L (ref 134–144)
Total Protein: 7.8 g/dL (ref 6.0–8.5)
eGFR: 67 mL/min/{1.73_m2} (ref >59)

## 2024-03-01 LAB — CBC
Hematocrit: 47 % (ref 37.5–51.0)
Hemoglobin: 15.6 g/dL (ref 13.0–17.7)
MCH: 32 pg (ref 26.6–33.0)
MCHC: 33.2 g/dL (ref 31.5–35.7)
MCV: 96 fL (ref 79–97)
Platelets: 218 10*3/uL (ref 150–450)
RBC: 4.88 x10E6/uL (ref 4.14–5.80)
RDW: 12.3 % (ref 11.6–15.4)
WBC: 6.2 10*3/uL (ref 3.4–10.8)

## 2024-03-01 LAB — TSH+FREE T4
Free T4: 1.35 ng/dL (ref 0.82–1.77)
TSH: 0.45 u[IU]/mL (ref 0.450–4.500)

## 2024-03-06 DIAGNOSIS — M79675 Pain in left toe(s): Secondary | ICD-10-CM | POA: Diagnosis not present

## 2024-03-06 DIAGNOSIS — L851 Acquired keratosis [keratoderma] palmaris et plantaris: Secondary | ICD-10-CM | POA: Diagnosis not present

## 2024-03-06 DIAGNOSIS — E1151 Type 2 diabetes mellitus with diabetic peripheral angiopathy without gangrene: Secondary | ICD-10-CM | POA: Diagnosis not present

## 2024-03-06 DIAGNOSIS — M79674 Pain in right toe(s): Secondary | ICD-10-CM | POA: Diagnosis not present

## 2024-03-06 DIAGNOSIS — E1142 Type 2 diabetes mellitus with diabetic polyneuropathy: Secondary | ICD-10-CM | POA: Diagnosis not present

## 2024-03-06 DIAGNOSIS — B351 Tinea unguium: Secondary | ICD-10-CM | POA: Diagnosis not present

## 2024-04-01 ENCOUNTER — Other Ambulatory Visit: Payer: Self-pay | Admitting: Internal Medicine

## 2024-04-01 DIAGNOSIS — I10 Essential (primary) hypertension: Secondary | ICD-10-CM

## 2024-04-10 ENCOUNTER — Encounter: Payer: Self-pay | Admitting: Cardiology

## 2024-04-10 ENCOUNTER — Ambulatory Visit: Attending: Cardiology | Admitting: Cardiology

## 2024-04-10 VITALS — BP 140/70 | HR 82 | Ht 67.0 in | Wt 128.2 lb

## 2024-04-10 DIAGNOSIS — R0789 Other chest pain: Secondary | ICD-10-CM | POA: Insufficient documentation

## 2024-04-10 DIAGNOSIS — I10 Essential (primary) hypertension: Secondary | ICD-10-CM | POA: Diagnosis not present

## 2024-04-10 DIAGNOSIS — I491 Atrial premature depolarization: Secondary | ICD-10-CM | POA: Insufficient documentation

## 2024-04-10 DIAGNOSIS — I739 Peripheral vascular disease, unspecified: Secondary | ICD-10-CM | POA: Insufficient documentation

## 2024-04-10 NOTE — Patient Instructions (Addendum)

## 2024-04-10 NOTE — Progress Notes (Signed)
 Clinical Summary Mr. Chase Briggs is a 74 y.o.male seen today for follow up of the following medical problems.    1. Chest pain - occasional left sided pain. Dull pain, 1/10 in severity. Sporadic in frequency, usually with upper body activity. Better with rubbing area. Lasts a few minutes. - walks daily x 20 -30 minutes, no exertional symptoms   CAD risk factors: HTN, HL, DM2, PAD, tobacco     -admit 04/2022 with chest pain.  04/2022 CT PE negative for PE  04/2022 nuclear stress: inferior defect likely artifact, cannot completely exclude scar but no current ischemia 04/2022 echo: LVEF 60-65%, no WMAs, grade I dd   - no recent chest pains, no SOB/DOE - compliant with meds     2. PAD - followed by vascular - prior stent to right external iliac, left external iliac - followed by vascular, last appt 12/2023  08/2023: TC 87 TG 36 HDL 40 LDL 36   3. HTN - he is compliant with meds -orthostatic dizziness, pcp had lowered hydrochlorothiazide  to 12.5mg  daily, symptoms resolved - home bp's 130-140s/70s-80s, however does not sit for 5 minutes prior to checking    4.DM2 - followed by Dr Lenis  6. COPD  7. PACs - noted at recent pcp visit by EKG - patient denies symptoms.    Past Medical History:  Diagnosis Date   Diabetes mellitus    Fatty liver    GERD (gastroesophageal reflux disease)    HTN (hypertension)    Hyperlipidemia    Insomnia    Renal insufficiency      No Known Allergies   Current Outpatient Medications  Medication Sig Dispense Refill   albuterol  (VENTOLIN  HFA) 108 (90 Base) MCG/ACT inhaler INHALE 2 PUFFS INTO THE LUNGS EVERY 6 HOURS AS NEEDED FOR WHEEZING OR SHORTNESS OF BREATH 18 g 0   albuterol  (VENTOLIN  HFA) 108 (90 Base) MCG/ACT inhaler Inhale 2 puffs into the lungs every 4 (four) hours as needed for wheezing or shortness of breath. 18 g 0   amLODipine  (NORVASC ) 10 MG tablet TAKE 1 TABLET BY MOUTH DAILY 90 tablet 3   aspirin  EC 81 MG tablet Take  1 tablet (81 mg total) by mouth daily with breakfast. 30 tablet 11   atorvastatin  (LIPITOR) 10 MG tablet TAKE 1 TABLET(10 MG) BY MOUTH DAILY 90 tablet 1   Cholecalciferol (VITAMIN D ) 125 MCG (5000 UT) CAPS Take 2,000 Units by mouth daily. 30 capsule 3   cloNIDine  (CATAPRES ) 0.1 MG tablet TAKE 1 TABLET(0.1 MG) BY MOUTH TWICE DAILY 180 tablet 3   clopidogrel  (PLAVIX ) 75 MG tablet TAKE 1 TABLET BY MOUTH DAILY 90 tablet 3   EPINEPHrine  0.3 mg/0.3 mL IJ SOAJ injection Inject 0.3 mg into the muscle as needed for anaphylaxis. 1 each 1   famotidine  (PEPCID ) 20 MG tablet Take 1 tablet (20 mg total) by mouth 2 (two) times daily. 60 tablet 2   ferrous sulfate  325 (65 FE) MG EC tablet Take 325 mg by mouth daily with breakfast.     glipiZIDE  (GLUCOTROL  XL) 2.5 MG 24 hr tablet TAKE 1 TABLET(2.5 MG) BY MOUTH DAILY WITH BREAKFAST 90 tablet 0   glucose blood test strip 1 each by Other route 2 (two) times daily. Use as instructed bid E11.65 Relion Premier 100 each 5   insulin  glargine (LANTUS  SOLOSTAR) 100 UNIT/ML Solostar Pen Inject 12 Units into the skin daily after breakfast. 15 mL 0   Lancets MISC 1 each by Does not apply  route 2 (two) times daily. E11.65 Relion Premier 100 each 5   lisinopril  (ZESTRIL ) 40 MG tablet Take 1 tablet (40 mg total) by mouth daily. 90 tablet 1   mirtazapine  (REMERON ) 15 MG tablet TAKE 1 TABLET(15 MG) BY MOUTH AT BEDTIME 30 tablet 3   mupirocin  ointment (BACTROBAN ) 2 % Apply 1 Application topically 2 (two) times daily. Over upper lip area. 22 g 0   SURE COMFORT PEN NEEDLES 32G X 4 MM MISC USE ONCE DAILY 100 each 3   No current facility-administered medications for this visit.     Past Surgical History:  Procedure Laterality Date   ABDOMINAL AORTOGRAM W/LOWER EXTREMITY N/A 08/22/2018   Procedure: ABDOMINAL AORTOGRAM W/LOWER EXTREMITY;  Surgeon: Sheree Penne Bruckner, MD;  Location: [facility] INVASIVE CV LAB;  Service: Cardiovascular;  Laterality: N/A;   BACK SURGERY     CARPAL  TUNNEL WITH CUBITAL TUNNEL Right 03/21/2019   Procedure: RIGHT CARPAL TUNNEL AND CUBITAL  TUNNEL RELEASES;  Surgeon: Murrell Kuba, MD;  Location: [facility];  Service: Orthopedics;  Laterality: Right;  AXILLARY BLOCK   COLONOSCOPY  01/2007   Dr. Mavis, reviewed report, mild diverticulosis. Prep adequate. Next TCS due 01/2017   ESOPHAGOGASTRODUODENOSCOPY N/A 10/21/2012   DOQ:[id number] GASTRITIS   MASS EXCISION Right 10/26/2012   Procedure: EXCISION MASS;  Surgeon: Oneil DELENA Mavis, MD;  Location: [facility];  Service: General;  Laterality: Right;   PERIPHERAL VASCULAR INTERVENTION  08/22/2018   Procedure: PERIPHERAL VASCULAR INTERVENTION;  Surgeon: Sheree Penne Bruckner, MD;  Location: [facility] INVASIVE CV LAB;  Service: Cardiovascular;;  bilateral external iliac stents   scalp mass  4-5 yrs ago   Dr Mavis COWBOY NERVE TRANSPOSITION Right 03/21/2019   Procedure: DECOMPRESSION RIGHT ULNAR NERVE;  Surgeon: Murrell Kuba, MD;  Location: [facility];  Service: Orthopedics;  Laterality: Right;     No Known Allergies    Family History  Problem Relation Age of Onset   Diabetes Mother    Diabetes Father    Diabetes Brother    Colon cancer Neg Hx    Liver disease Neg Hx      Social History Mr. Chase Briggs reports that he has been smoking cigarettes. He has a 17.5 pack-year smoking history. He has never used smokeless tobacco. Mr. Chase Briggs reports no history of alcohol use.     Physical Examination Today's Vitals   04/10/24 0830  BP: (!) 168/72  Pulse: 82  SpO2: 93%  Weight: 128 lb 3.2 oz (58.2 kg)  Height: 5' 7 (1.702 m)   Body mass index is 20.08 kg/m.  Gen: resting comfortably, no acute distress HEENT: no scleral icterus, pupils equal round and reactive, no palptable cervical adenopathy,  CV: RRR, no mrg, no jvd Resp: Clear to auscultation bilaterally GI: abdomen is soft, non-tender, non-distended, normal bowel sounds, no hepatosplenomegaly MSK:  extremities are warm, no edema.  Skin: warm, no rash Neuro:  no focal deficits Psych: appropriate affect   Diagnostic Studies  04/2022 nuclear stress Findings are equivocal. The study is low risk.   No ST deviation was noted. Arrhythmias during stress: occasional PACs, rare PVCs. The ECG was negative for ischemia.   LV perfusion is abnormal.  Suspected diaphragmatic attenuation affecting the inferior wall predominantly on rest imaging, cannot completely exclude scar but no large region of ischemia.   Left ventricular function is low normal. Nuclear stress EF: 51 %.   Diaphragmatic attenuation artifact was present. Image quality affected due to significant extracardiac activity  near the inferior wall.   Overall low risk study with suspected inferior diaphragmatic attenuation, less likely scar.  No large ischemic territories and low normal LVEF at 51%.     04/2022 echo IMPRESSIONS     1. Left ventricular ejection fraction, by estimation, is 60 to 65%. The  left ventricle has normal function. The left ventricle has no regional  wall motion abnormalities. Left ventricular diastolic parameters are  consistent with Grade I diastolic  dysfunction (impaired relaxation).   2. Right ventricular systolic function is normal. The right ventricular  size is normal. There is mildly elevated pulmonary artery systolic  pressure.   3. The mitral valve is normal in structure. Trivial mitral valve  regurgitation. No evidence of mitral stenosis.   4. The aortic valve is tricuspid. Aortic valve regurgitation is not  visualized. No aortic stenosis is present.   5. The inferior vena cava is normal in size with greater than 50%  respiratory variability, suggesting right atrial pressure of 3 mmHg.      Assessment and Plan   1. Chest pain - extensive workup as outlined above without evidence of significant cardiac pathology - denies any recent symptoms, continue to monitor   2. HTN - elevated here  but has been at goal at several other provider visits over the last few months. Just took his meds prior to our visit - continue current meds   3. PAD - per vascular, he is on DAPT by vascular and not for cardiac reason. Defer duration to vascular -cholesterol well controlled, continue current meds  4. PACs - noted on EKG, asymptomatic. No indication for further testing or management in the absence of symptoms.    F/u 1 year    Dorn PHEBE Ross, M.D.

## 2024-04-15 ENCOUNTER — Other Ambulatory Visit: Payer: Self-pay | Admitting: "Endocrinology

## 2024-04-19 ENCOUNTER — Ambulatory Visit: Payer: Self-pay

## 2024-04-19 NOTE — Telephone Encounter (Signed)
 Patient scheduled.

## 2024-04-19 NOTE — Telephone Encounter (Signed)
 FYI Only or Action Required?: FYI only for provider.  Patient was last seen in primary care on 02/29/2024 by Chase Suzzane POUR, MD.  Called Nurse Triage reporting Back Pain.  Symptoms began several weeks ago.  Interventions attempted: OTC medications: Tylenol  and Rest, hydration, or home remedies.  Symptoms are: unchanged.  Triage Disposition: See Physician Within 24 Hours (overriding See HCP Within 4 Hours (Or PCP Triage))  Patient/caregiver understands and will follow disposition?: Yes  Copied from CRM #[id number]. Topic: Clinical - Red Word Triage >> Apr 19, 2024  4:07 PM [name] wrote: Red Word that prompted transfer to Nurse Triage: Pt states he has been having back pain on and off for about a month , 8/10 pain level Reason for Disposition  [1] SEVERE back pain (e.g., excruciating, unable to do any normal activities) AND [2] not improved 2 hours after pain medicine  Answer Assessment - Initial Assessment Questions 1. ONSET: When did the pain begin? (e.g., minutes, hours, days)     Started over a month ago 2. LOCATION: Where does it hurt? (upper, mid or lower back)     Lower pain 3. SEVERITY: How bad is the pain?  (e.g., Scale 1-10; mild, moderate, or severe)     7-8 out of 10 4. PATTERN: Is the pain constant? (e.g., yes, no; constant, intermittent)      Intermittent 5. RADIATION: Does the pain shoot into your legs or somewhere else?     yes 6. CAUSE:  What do you think is causing the back pain?      unsure 7. BACK OVERUSE:  Any recent lifting of heavy objects, strenuous work or exercise?     no 8. MEDICINES: What have you taken so far for the pain? (e.g., nothing, acetaminophen , NSAIDS)     Tylenol  9. NEUROLOGIC SYMPTOMS: Do you have any weakness, numbness, or problems with bowel/bladder control?     no 10. OTHER SYMPTOMS: Do you have any other symptoms? (e.g., fever, abdomen pain, burning with urination, blood in urine)       no  Protocols used: Back  Pain-A-AH

## 2024-04-20 ENCOUNTER — Ambulatory Visit (INDEPENDENT_AMBULATORY_CARE_PROVIDER_SITE_OTHER): Payer: Self-pay | Admitting: Family Medicine

## 2024-04-20 ENCOUNTER — Ambulatory Visit (HOSPITAL_COMMUNITY)
Admission: RE | Admit: 2024-04-20 | Discharge: 2024-04-20 | Disposition: A | Discharge status: Home / Self Care | Source: Ambulatory Visit | Attending: Family Medicine | Admitting: Family Medicine

## 2024-04-20 ENCOUNTER — Encounter: Payer: Self-pay | Admitting: Family Medicine

## 2024-04-20 VITALS — BP 147/85 | HR 85 | Resp 16 | Ht 67.0 in | Wt 128.1 lb

## 2024-04-20 DIAGNOSIS — M549 Dorsalgia, unspecified: Secondary | ICD-10-CM | POA: Diagnosis not present

## 2024-04-20 DIAGNOSIS — M47816 Spondylosis without myelopathy or radiculopathy, lumbar region: Secondary | ICD-10-CM | POA: Diagnosis not present

## 2024-04-20 DIAGNOSIS — M545 Low back pain, unspecified: Secondary | ICD-10-CM | POA: Insufficient documentation

## 2024-04-20 DIAGNOSIS — G8929 Other chronic pain: Secondary | ICD-10-CM | POA: Diagnosis not present

## 2024-04-20 MED ORDER — TIZANIDINE HCL 4 MG PO TABS: 4.0000 mg | ORAL_TABLET | Freq: Two times a day (BID) | ORAL | 2 refills | Status: DC | PRN

## 2024-04-20 NOTE — Progress Notes (Signed)
 Established Patient Office Visit   Subjective  Patient ID: Chase Briggs, male    DOB: 30-Apr-1951  Age: 73 y.o. MRN: [id number]  Chief Complaint  Patient presents with   Back Pain    Has been hurting for over a month, flaring up then easing off. Right side of back and when he starts walking it feels like the pain goes into his right hip     He  has a past medical history of Diabetes mellitus, Fatty liver, GERD (gastroesophageal reflux disease), HTN (hypertension), Hyperlipidemia, Insomnia, and Renal insufficiency.  The patient presents with chronic back pain, with the current episode persisting for over a month. The pain is constant and has been gradually worsening since its onset. It is localized to the lumbar spine, primarily on the right side, and is described as a dull ache that radiates down to the right thigh. The pain is consistently rated at 8 out of 10 in severity and does not fluctuate. Symptoms are aggravated by standing and walking. Associated symptoms include right leg pain, numbness, tingling, and weakness. The patient denies chest pain or fever. Contributing risk factors include a sedentary lifestyle and lack of regular exercise. He has attempted symptom relief with analgesics, which have only provided mild improvement.    Review of Systems  Constitutional:  Negative for chills and fever.  Cardiovascular:  Negative for chest pain.  Musculoskeletal:  Positive for back pain.  Neurological:  Positive for tingling and weakness.      Objective:     BP (!) 147/85   Pulse 85   Resp 16   Ht 5' 7 (1.702 m)   Wt 128 lb 1.9 oz (58.1 kg)   SpO2 92%   BMI 20.07 kg/m  BP Readings from Last 3 Encounters:  04/20/24 (!) 147/85  04/10/24 (!) 140/70  02/29/24 134/72      Physical Exam Vitals reviewed.  Constitutional:      General: He is not in acute distress.    Appearance: Normal appearance. He is not ill-appearing, toxic-appearing or diaphoretic.  HENT:      Head: Normocephalic.  Eyes:     General:        Right eye: No discharge.        Left eye: No discharge.     Conjunctiva/sclera: Conjunctivae normal.  Cardiovascular:     Rate and Rhythm: Normal rate.     Pulses: Normal pulses.     Heart sounds: Normal heart sounds.  Pulmonary:     Effort: Pulmonary effort is normal. No respiratory distress.     Breath sounds: Normal breath sounds.  Abdominal:     Tenderness: There is no abdominal tenderness.  Musculoskeletal:     Cervical back: Normal range of motion.     Thoracic back: Decreased range of motion.     Lumbar back: Spasms present. No tenderness. Decreased range of motion. Positive right straight leg raise test. Negative left straight leg raise test.  Skin:    General: Skin is warm and dry.  Neurological:     Mental Status: He is alert.  Psychiatric:        Mood and Affect: Mood normal.      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 Engage in low-impact exercises like walkingto strengthen core muscles and reduce strain on the spine. Apply heat or ice packs as needed for pain relief and consider physical therapy for targeted exercises.   Orders: -     DG Lumbar Spine Complete; Future -     tiZANidine  HCl; Take 1 tablet (4 mg total) by mouth 2 (two) times daily as needed for muscle spasms.  Dispense: 60 tablet; Refill: 2    Return if symptoms worsen or fail to improve.   Hilario Kidd Wilhelmena Falter, FNP

## 2024-04-20 NOTE — Assessment & Plan Note (Signed)
 Trial Zanaflex  4 mg PRN Xray ordered  Discussed to focus on maintaining good posture, using lumbar support while sitting, and avoiding prolonged sitting or heavy lifting. Engage in low-impact exercises like walkingto strengthen core muscles and reduce strain on the spine. Apply heat or ice packs as needed for pain relief and consider physical therapy for targeted exercises.

## 2024-04-20 NOTE — Patient Instructions (Signed)

## 2024-04-21 ENCOUNTER — Ambulatory Visit: Admitting: Urology

## 2024-04-22 ENCOUNTER — Other Ambulatory Visit: Payer: Self-pay | Admitting: Internal Medicine

## 2024-04-22 DIAGNOSIS — I10 Essential (primary) hypertension: Secondary | ICD-10-CM

## 2024-05-01 ENCOUNTER — Telehealth: Payer: Self-pay

## 2024-05-01 NOTE — Telephone Encounter (Signed)
 Copied from CRM #[id number]. Topic: Clinical - Lab/Test Results >> May 01, 2024 10:34 AM Larissa RAMAN wrote: Reason for CRM: Patient requesting Xray results, ok to leave VM

## 2024-05-01 NOTE — Telephone Encounter (Signed)
 Have not been reviewed.

## 2024-05-03 ENCOUNTER — Ambulatory Visit: Payer: Self-pay | Admitting: Family Medicine

## 2024-05-03 ENCOUNTER — Other Ambulatory Visit: Payer: Self-pay | Admitting: Family Medicine

## 2024-05-03 DIAGNOSIS — M51361 Other intervertebral disc degeneration, lumbar region with lower extremity pain only: Secondary | ICD-10-CM

## 2024-05-03 NOTE — Progress Notes (Signed)
 Please inform patient:  No fracture or dislocation. 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  What this means: Your back pain is due to chronic degenerative changes,   Treatment: Stay active, use heat/ice for pain, and consider pain relievers if safe. Referrals have been placed to Orthopedics and Physical Therapy to help manage your symptoms and improve mobility.

## 2024-05-22 DIAGNOSIS — M79674 Pain in right toe(s): Secondary | ICD-10-CM | POA: Diagnosis not present

## 2024-05-22 DIAGNOSIS — B351 Tinea unguium: Secondary | ICD-10-CM | POA: Diagnosis not present

## 2024-05-22 DIAGNOSIS — E1151 Type 2 diabetes mellitus with diabetic peripheral angiopathy without gangrene: Secondary | ICD-10-CM | POA: Diagnosis not present

## 2024-05-22 DIAGNOSIS — L851 Acquired keratosis [keratoderma] palmaris et plantaris: Secondary | ICD-10-CM | POA: Diagnosis not present

## 2024-05-25 ENCOUNTER — Ambulatory Visit

## 2024-05-25 VITALS — BP 132/74 | Ht 67.0 in | Wt 128.0 lb

## 2024-05-25 DIAGNOSIS — Z0000 Encounter for general adult medical examination without abnormal findings: Secondary | ICD-10-CM | POA: Diagnosis not present

## 2024-05-25 DIAGNOSIS — F17210 Nicotine dependence, cigarettes, uncomplicated: Secondary | ICD-10-CM

## 2024-05-25 NOTE — Patient Instructions (Signed)
 Chase Briggs,  Thank you for taking the time for your Medicare Wellness Visit. I appreciate your continued commitment to your health goals. Please review the care plan we discussed, and feel free to reach out if I can assist you further.    Ongoing Care Seeing your primary care provider every 3 to 6 months helps us  monitor your health and provide consistent, personalized care.     Referrals  If a referral was made during today's visit and you haven't received any updates within two weeks, please contact the referred provider directly to check on the status.  Lung Cancer Screening-[place] [facility] [address]-First Floor Medical Building directly across from [facility] Phone [phone]   Wishing you good health and many blessings for the year to come  -Emalia Witkop    Medicare recommends these wellness visits once per year to help you and your care team stay ahead of potential health issues. These visits are designed to focus on prevention, allowing your provider to concentrate on managing your acute and chronic conditions during your regular appointments.     Please note that Annual Wellness Visits do not include a physical exam. Some assessments may be limited, especially if the visit was conducted virtually. If needed, we may recommend a separate in-person follow-up with your provider.  Recommended Screenings:  Health Maintenance  Topic Date Due   DTaP/Tdap/Td vaccine (1 - Tdap) Never done   Zoster (Shingles) Vaccine (1 of 2) Never done   Screening for Lung Cancer  09/03/2023   Flu Shot  04/14/2024   Complete foot exam   04/26/2024   Hemoglobin A1C  08/29/2024   Yearly kidney health urinalysis for diabetes  10/28/2024   Eye exam for diabetics  10/31/2024   Cologuard (Stool DNA test)  01/22/2025   Yearly kidney function blood test for diabetes  02/28/2025   Medicare Annual Wellness Visit  05/25/2025   Pneumococcal Vaccine for age over 35  Completed   Hepatitis C  Screening  Completed   HPV Vaccine  Aged Out   Meningitis B Vaccine  Aged Out   Colon Cancer Screening  Discontinued   COVID-19 Vaccine  Discontinued       05/25/2024    9:56 AM  Advanced Directives  Does Patient Have a Medical Advance Directive? No  Would patient like information on creating a medical advance directive? No - Patient declined    Advance Care Planning is important because it: Ensures you receive medical care that aligns with your values, goals, and preferences. Provides guidance to your family and loved ones, reducing the emotional burden of decision-making during critical moments.    Vision: Annual vision screenings are recommended for early detection of glaucoma, cataracts, and diabetic retinopathy. These exams can also reveal signs of chronic conditions such as diabetes and high blood pressure.    Dental: Annual dental screenings help detect early signs of oral cancer, gum disease, and other conditions linked to overall health, including heart disease and diabetes.  Please see the attached documents for additional preventive care recommendations.

## 2024-05-25 NOTE — Progress Notes (Signed)
 Subjective:   Chase Briggs is a 73 y.o. who presents for a Medicare Wellness preventive visit.  As a reminder, Annual Wellness Visits don't include a physical exam, and some assessments may be limited, especially if this visit is performed virtually. We may recommend an in-person follow-up visit with your provider if needed.  Visit Complete: Virtual I connected with  Chase Briggs on 05/25/24 by a audio enabled telemedicine application and verified that I am speaking with the correct person using two identifiers.  Patient Location: Home  Provider Location: Home Office  I discussed the limitations of evaluation and management by telemedicine. The patient expressed understanding and agreed to proceed.  Vital Signs: Because this visit was a virtual/telehealth visit, some criteria may be missing or patient reported. Any vitals not documented were not able to be obtained and vitals that have been documented are patient reported.  VideoDeclined- This patient declined Librarian, academic. Therefore the visit was completed with audio only.  Persons Participating in Visit: Patient.  AWV Questionnaire: No: Patient Medicare AWV questionnaire was not completed prior to this visit.  Cardiac Risk Factors include: advanced age (>78men, >24 women);diabetes mellitus;dyslipidemia;hypertension;male gender;smoking/ tobacco exposure     Objective:    Today's Vitals   05/25/24 0948  BP: 132/74  Weight: 128 lb (58.1 kg)  Height: 5' 7 (1.702 m)   Body mass index is 20.05 kg/m.     05/25/2024    9:56 AM 07/29/2023    3:57 PM 06/09/2023    2:00 PM 06/09/2023    8:57 AM 05/13/2023    7:48 AM 04/16/2023    9:40 AM 12/25/2022    8:24 AM  Advanced Directives  Does Patient Have a Medical Advance Directive? No No No No Yes No Yes  Type of Agricultural consultant;Living will  Healthcare Power of [place];Living will  Copy of Healthcare  Power of Attorney in Chart?     No - copy requested  No - copy requested  Would patient like information on creating a medical advance directive? No - Patient declined  No - Patient declined No - Patient declined No - Patient declined No - Patient declined     Current Medications (verified) Outpatient Encounter Medications as of 05/25/2024  Medication Sig   albuterol  (VENTOLIN  HFA) 108 (90 Base) MCG/ACT inhaler INHALE 2 PUFFS INTO THE LUNGS EVERY 6 HOURS AS NEEDED FOR WHEEZING OR SHORTNESS OF BREATH   amLODipine  (NORVASC ) 10 MG tablet TAKE 1 TABLET BY MOUTH DAILY   aspirin  EC 81 MG tablet Take 1 tablet (81 mg total) by mouth daily with breakfast.   atorvastatin  (LIPITOR) 10 MG tablet TAKE 1 TABLET(10 MG) BY MOUTH DAILY   Cholecalciferol (VITAMIN D ) 125 MCG (5000 UT) CAPS Take 2,000 Units by mouth daily.   cloNIDine  (CATAPRES ) 0.1 MG tablet TAKE 1 TABLET(0.1 MG) BY MOUTH TWICE DAILY   clopidogrel  (PLAVIX ) 75 MG tablet TAKE 1 TABLET BY MOUTH DAILY   EPINEPHrine  0.3 mg/0.3 mL IJ SOAJ injection Inject 0.3 mg into the muscle as needed for anaphylaxis.   famotidine  (PEPCID ) 20 MG tablet Take 1 tablet (20 mg total) by mouth 2 (two) times daily.   ferrous sulfate  325 (65 FE) MG EC tablet Take 325 mg by mouth daily with breakfast.   glipiZIDE  (GLUCOTROL  XL) 2.5 MG 24 hr tablet TAKE 1 TABLET(2.5 MG) BY MOUTH DAILY WITH BREAKFAST   glucose blood test strip 1 each by Other route  2 (two) times daily. Use as instructed bid E11.65 Relion Premier   insulin  glargine (LANTUS  SOLOSTAR) 100 UNIT/ML Solostar Pen Inject 12 Units into the skin daily after breakfast.   Lancets MISC 1 each by Does not apply route 2 (two) times daily. E11.65 Relion Premier   lisinopril  (ZESTRIL ) 40 MG tablet TAKE 1 TABLET(40 MG) BY MOUTH DAILY   mirtazapine  (REMERON ) 15 MG tablet TAKE 1 TABLET(15 MG) BY MOUTH AT BEDTIME   mupirocin  ointment (BACTROBAN ) 2 % Apply 1 Application topically 2 (two) times daily. Over upper lip area.   SURE  COMFORT PEN NEEDLES 32G X 4 MM MISC USE ONCE DAILY   tiZANidine  (ZANAFLEX ) 4 MG tablet Take 1 tablet (4 mg total) by mouth 2 (two) times daily as needed for muscle spasms.   No facility-administered encounter medications on file as of 05/25/2024.    Allergies (verified) Patient has no known allergies.   History: Past Medical History:  Diagnosis Date   Diabetes mellitus    Fatty liver    GERD (gastroesophageal reflux disease)    HTN (hypertension)    Hyperlipidemia    Insomnia    Renal insufficiency    Past Surgical History:  Procedure Laterality Date   ABDOMINAL AORTOGRAM W/LOWER EXTREMITY N/A 08/22/2018   Procedure: ABDOMINAL AORTOGRAM W/LOWER EXTREMITY;  Surgeon: Sheree Penne Bruckner, MD;  Location: [facility] INVASIVE CV LAB;  Service: Cardiovascular;  Laterality: N/A;   BACK SURGERY     CARPAL TUNNEL WITH CUBITAL TUNNEL Right 03/21/2019   Procedure: RIGHT CARPAL TUNNEL AND CUBITAL  TUNNEL RELEASES;  Surgeon: Murrell Kuba, MD;  Location: [facility];  Service: Orthopedics;  Laterality: Right;  AXILLARY BLOCK   COLONOSCOPY  01/2007   Dr. Mavis, reviewed report, mild diverticulosis. Prep adequate. Next TCS due 01/2017   ESOPHAGOGASTRODUODENOSCOPY N/A 10/21/2012   DOQ:FNIZMJUZ GASTRITIS   MASS EXCISION Right 10/26/2012   Procedure: EXCISION MASS;  Surgeon: Oneil DELENA Mavis, MD;  Location: [facility];  Service: General;  Laterality: Right;   PERIPHERAL VASCULAR INTERVENTION  08/22/2018   Procedure: PERIPHERAL VASCULAR INTERVENTION;  Surgeon: Sheree Penne Bruckner, MD;  Location: [facility] INVASIVE CV LAB;  Service: Cardiovascular;;  bilateral external iliac stents   scalp mass  4-5 yrs ago   Dr Mavis COWBOY NERVE TRANSPOSITION Right 03/21/2019   Procedure: DECOMPRESSION RIGHT ULNAR NERVE;  Surgeon: Murrell Kuba, MD;  Location: [facility];  Service: Orthopedics;  Laterality: Right;   Family History  Problem Relation Age of Onset   Diabetes Mother    Diabetes Father     Diabetes Brother    Colon cancer Neg Hx    Liver disease Neg Hx    Social History   Socioeconomic History   Marital status: Married    Spouse name: Not on file   Number of children: 2   Years of education: Not on file   Highest education level: Not on file  Occupational History   Occupation: reitred    Comment: [employer], heavy lifting  Tobacco Use   Smoking status: Every Day    Current packs/day: 0.50    Average packs/day: 0.5 packs/day for 54.7 years (27.3 ttl pk-yrs)    Types: Cigarettes    Start date: 61   Smokeless tobacco: Never  Vaping Use   Vaping status: Never Used  Substance and Sexual Activity   Alcohol use: No    Comment: hx heavy etoh x 3 yrs, quit 30+ yrs ago   Drug use: No  Sexual activity: Yes    Birth control/protection: None  Other Topics Concern   Not on file  Social History Narrative   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 Highest level of education: 12th grade. Married for 12 years. Has 2 sons (33 and 21). Lives with wife.   Social Drivers of Corporate investment banker Strain: Low Risk  (05/25/2024)   Overall Financial Resource Strain (CARDIA)    Difficulty of Paying Living Expenses: Not hard at all  Food Insecurity: No Food Insecurity (05/25/2024)   Hunger Vital Sign    Worried About Running Out of Food in the Last Year: Never true    Ran Out of Food in the Last Year: Never true  Transportation Needs: No Transportation Needs (05/25/2024)   PRAPARE - Administrator, Civil Service (Medical): No    Lack of Transportation (Non-Medical): No  Physical Activity: Sufficiently Active (05/25/2024)   Exercise Vital Sign    Days of Exercise per Week: 7 days    Minutes of Exercise per Session: 30 min  Stress: No Stress Concern Present (05/25/2024)   [employer] of Occupational Health - Occupational Stress Questionnaire    Feeling of Stress: Not at all  Social Connections: Socially Integrated (05/25/2024)   Social Connection and  Isolation Panel    Frequency of Communication with Friends and Family: More than three times a week    Frequency of Social Gatherings with Friends and Family: More than three times a week    Attends Religious Services: More than 4 times per year    Active Member of [employer] or Organizations: Yes    Attends Engineer, structural: More than 4 times per year    Marital Status: Married    Tobacco Counseling Ready to quit: Yes Counseling given: Yes    Clinical Intake:  Pre-visit preparation completed: Yes  Pain : No/denies pain     BMI - recorded: 20.05 Nutritional Status: BMI of 19-24  Normal Nutritional Risks: None Diabetes: Yes CBG done?: No (telehealth visit) Did pt. bring in CBG monitor from home?: No  Lab Results  Component Value Date   HGBA1C 6.3 02/28/2024   HGBA1C 6.3 (H) 06/09/2023   HGBA1C 6.6 02/25/2023     How often do you need to have someone help you when you read instructions, pamphlets, or other written materials from your doctor or pharmacy?: 1 - Never  Interpreter Needed?: No  Information entered by :: Finis Hendricksen W CMA (AAMA)   Activities of Daily Living     05/25/2024    9:56 AM 06/09/2023    2:00 PM  In your present state of health, do you have any difficulty performing the following activities:  Hearing? 0 0  Vision? 0 0  Difficulty concentrating or making decisions? 0 0  Walking or climbing stairs? 0   Dressing or bathing? 0   Doing errands, shopping? 0 0  Preparing Food and eating ? N   Using the Toilet? N   In the past six months, have you accidently leaked urine? N   Do you have problems with loss of bowel control? N   Managing your Medications? N   Managing your Finances? N   Housekeeping or managing your Housekeeping? N     Patient Care Team: Tobie Suzzane POUR, MD as PCP - General (Internal Medicine) Alvan, Dorn FALCON, MD as Consulting Physician (Cardiology) McKenzie, Belvie CROME, MD as Consulting Physician (Urology) Lenis Ethelle ORN, MD as Consulting Physician (Endocrinology)  Darroll Anes, DO as Consulting Physician (Optometry)  I have updated your Care Teams any recent Medical Services you may have received from other providers in the past year.     Assessment:   This is a routine wellness examination for Chase Briggs.  Hearing/Vision screen Hearing Screening - Comments:: Patient denies any hearing difficulties.   Vision Screening - Comments:: Patient wears reading glasses only. Up to date with yearly exams.  Sees Anes Darroll at [facility] location   Goals Addressed               This Visit's Progress     Patient Stated   On track     Patient states that his goal is to stop smoking cigarettes.       Remain active and healthy (pt-stated)   On track     I want to remain active and healthy       Depression Screen     05/25/2024    9:26 AM 02/29/2024    8:39 AM 10/29/2023    8:15 AM 07/29/2023    8:40 AM 06/23/2023    1:57 PM 06/08/2023   10:38 AM 05/25/2023    3:01 PM  PHQ 2/9 Scores  PHQ - 2 Score 0 0 0 2 2 2 2   PHQ- 9 Score 0 0 0 7 7 7 7     Fall Risk     05/25/2024    9:57 AM 02/29/2024    8:46 AM 02/29/2024    8:39 AM 10/29/2023    8:15 AM 07/29/2023    8:40 AM  Fall Risk   Falls in the past year? 0  0 0 0  Number falls in past yr: 0 1 0 0 0  Injury with Fall? 0  0 0 0  Risk for fall due to : No Fall Risks  No Fall Risks No Fall Risks No Fall Risks  Follow up Falls evaluation completed;Education provided;Falls prevention discussed  Falls evaluation completed Falls evaluation completed Falls evaluation completed    MEDICARE RISK AT HOME:  Medicare Risk at Home Any stairs in or around the home?: Yes If so, are there any without handrails?: No Home free of loose throw rugs in walkways, pet beds, electrical cords, etc?: Yes Adequate lighting in your home to reduce risk of falls?: Yes Life alert?: No Use of a cane, walker or w/c?: No Grab bars in the bathroom?:  Yes Shower chair or bench in shower?: Yes Elevated toilet seat or a handicapped toilet?: Yes  TIMED UP AND GO:  Was the test performed?  No  Cognitive Function: 6CIT completed        05/25/2024    9:57 AM 12/28/2022    8:56 AM 12/15/2021    8:40 AM 12/12/2020   10:35 AM 11/14/2019    9:11 AM  6CIT Screen  What Year? 0 points 0 points 0 points 0 points 0 points  What month? 0 points 0 points 0 points 0 points 0 points  What time? 0 points 0 points 0 points 0 points 0 points  Count back from 20 0 points 0 points 0 points 0 points 0 points  Months in reverse 0 points 0 points 2 points 2 points 0 points  Repeat phrase 0 points 0 points 0 points 0 points 0 points  Total Score 0 points 0 points 2 points 2 points 0 points    Immunizations Immunization History  Administered Date(s) Administered   Fluad Quad(high Dose 65+) 08/27/2021,  07/24/2022   Fluad Trivalent(High Dose 65+) 06/08/2023   Moderna Sars-Covid-2 Vaccination 01/04/2020, 02/06/2020   PNEUMOCOCCAL CONJUGATE-20 07/24/2022    Screening Tests Health Maintenance  Topic Date Due   DTaP/Tdap/Td (1 - Tdap) Never done   Zoster Vaccines- Shingrix (1 of 2) Never done   Lung Cancer Screening  09/03/2023   Influenza Vaccine  04/14/2024   FOOT EXAM  04/26/2024   HEMOGLOBIN A1C  08/29/2024   Diabetic kidney evaluation - Urine ACR  10/28/2024   OPHTHALMOLOGY EXAM  10/31/2024   Fecal DNA (Cologuard)  01/22/2025   Diabetic kidney evaluation - eGFR measurement  02/28/2025   Medicare Annual Wellness (AWV)  05/25/2025   Pneumococcal Vaccine: 50+ Years  Completed   Hepatitis C Screening  Completed   HPV VACCINES  Aged Out   Meningococcal B Vaccine  Aged Out   Colonoscopy  Discontinued   COVID-19 Vaccine  Discontinued    Health Maintenance  Health Maintenance Due  Topic Date Due   DTaP/Tdap/Td (1 - Tdap) Never done   Zoster Vaccines- Shingrix (1 of 2) Never done   Lung Cancer Screening  09/03/2023   Influenza Vaccine   04/14/2024   FOOT EXAM  04/26/2024   Health Maintenance Items Addressed: Referral sent to [facility] (smoker/hx smoking)  Additional Screening:  Vision Screening: Recommended annual ophthalmology exams for early detection of glaucoma and other disorders of the eye. Would you like a referral to an eye doctor? No    Dental Screening: Recommended annual dental exams for proper oral hygiene  Community Resource Referral / Chronic Care Management: CRR required this visit?  No   CCM required this visit?  No   Plan:    I have personally reviewed and noted the following in the patient's chart:   Medical and social history Use of alcohol, tobacco or illicit drugs  Current medications and supplements including opioid prescriptions. Patient is not currently taking opioid prescriptions. Functional ability and status Nutritional status Physical activity Advanced directives List of other physicians Hospitalizations, surgeries, and ER visits in previous 12 months Vitals Screenings to include cognitive, depression, and falls Referrals and appointments  In addition, I have reviewed and discussed with patient certain preventive protocols, quality metrics, and best practice recommendations. A written personalized care plan for preventive services as well as general preventive health recommendations were provided to patient.   Dorla Guizar, CMA   05/25/2024   After Visit Summary: (MyChart) Due to this being a telephonic visit, the after visit summary with patients personalized plan was offered to patient via MyChart   Notes: Nothing significant to report at this time.

## 2024-05-31 ENCOUNTER — Other Ambulatory Visit: Payer: Self-pay | Admitting: "Endocrinology

## 2024-06-02 ENCOUNTER — Other Ambulatory Visit: Payer: Self-pay | Admitting: Internal Medicine

## 2024-06-02 DIAGNOSIS — G4700 Insomnia, unspecified: Secondary | ICD-10-CM

## 2024-06-11 ENCOUNTER — Other Ambulatory Visit: Payer: Self-pay | Admitting: Internal Medicine

## 2024-06-15 ENCOUNTER — Ambulatory Visit (HOSPITAL_COMMUNITY): Attending: Family Medicine

## 2024-06-15 ENCOUNTER — Other Ambulatory Visit: Payer: Self-pay

## 2024-06-15 DIAGNOSIS — R262 Difficulty in walking, not elsewhere classified: Secondary | ICD-10-CM | POA: Insufficient documentation

## 2024-06-15 DIAGNOSIS — M79605 Pain in left leg: Secondary | ICD-10-CM | POA: Insufficient documentation

## 2024-06-15 DIAGNOSIS — M6281 Muscle weakness (generalized): Secondary | ICD-10-CM | POA: Diagnosis not present

## 2024-06-15 DIAGNOSIS — M51361 Other intervertebral disc degeneration, lumbar region with lower extremity pain only: Secondary | ICD-10-CM | POA: Diagnosis not present

## 2024-06-15 DIAGNOSIS — M545 Low back pain, unspecified: Secondary | ICD-10-CM | POA: Diagnosis not present

## 2024-06-15 DIAGNOSIS — M79604 Pain in right leg: Secondary | ICD-10-CM | POA: Diagnosis not present

## 2024-06-15 NOTE — Therapy (Signed)
 OUTPATIENT PHYSICAL THERAPY THORACOLUMBAR EVALUATION   Patient Name: Chase Briggs MRN: [id number] DOB:06/02/1951, 73 y.o., male Today's Date: 06/15/2024  END OF SESSION:  PT End of Session - 06/15/24 1328     Visit Number 1    Number of Visits 1    Date for Recertification  07/13/24    Authorization Type Medicare Part A/ Tricare secondary    Authorization Time Period no auth needed    PT Start Time 1332    PT Stop Time 1414    PT Time Calculation (min) 42 min    Activity Tolerance Patient tolerated treatment well    Behavior During Therapy WFL for tasks assessed/performed          Past Medical History:  Diagnosis Date   Diabetes mellitus    Fatty liver    GERD (gastroesophageal reflux disease)    HTN (hypertension)    Hyperlipidemia    Insomnia    Renal insufficiency    Past Surgical History:  Procedure Laterality Date   ABDOMINAL AORTOGRAM W/LOWER EXTREMITY N/A 08/22/2018   Procedure: ABDOMINAL AORTOGRAM W/LOWER EXTREMITY;  Surgeon: Sheree Penne Bruckner, MD;  Location: [facility] INVASIVE CV LAB;  Service: Cardiovascular;  Laterality: N/A;   BACK SURGERY     CARPAL TUNNEL WITH CUBITAL TUNNEL Right 03/21/2019   Procedure: RIGHT CARPAL TUNNEL AND CUBITAL  TUNNEL RELEASES;  Surgeon: Murrell Kuba, MD;  Location: [facility];  Service: Orthopedics;  Laterality: Right;  AXILLARY BLOCK   COLONOSCOPY  01/2007   Dr. Mavis, reviewed report, mild diverticulosis. Prep adequate. Next TCS due 01/2017   ESOPHAGOGASTRODUODENOSCOPY N/A 10/21/2012   DOQ:FNIZMJUZ GASTRITIS   MASS EXCISION Right 10/26/2012   Procedure: EXCISION MASS;  Surgeon: Oneil DELENA Mavis, MD;  Location: [facility];  Service: General;  Laterality: Right;   PERIPHERAL VASCULAR INTERVENTION  08/22/2018   Procedure: PERIPHERAL VASCULAR INTERVENTION;  Surgeon: Sheree Penne Bruckner, MD;  Location: [facility] INVASIVE CV LAB;  Service: Cardiovascular;;  bilateral external iliac stents   scalp mass  4-5 yrs ago   Dr  Mavis COWBOY NERVE TRANSPOSITION Right 03/21/2019   Procedure: DECOMPRESSION RIGHT ULNAR NERVE;  Surgeon: Murrell Kuba, MD;  Location: [facility];  Service: Orthopedics;  Laterality: Right;   Patient Active Problem List   Diagnosis Date Noted   Lumbar pain 04/20/2024   PAC (premature atrial contraction) 02/29/2024   Persistent cough 02/02/2024   Epistaxis 08/26/2023   Chronic fatigue 06/11/2023   Prostate mass 06/09/2023   Stage 3b chronic kidney disease (CKD) (HCC) 06/09/2023   Normocytic anemia 06/09/2023   Actinic cheilitis 05/19/2023   Epididymo-orchitis 04/27/2023   Insulin  long-term use (HCC) 02/25/2023   Lip swelling 02/10/2023   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   Left inguinal hernia 01/20/2023   Encounter for examination following treatment at hospital 01/20/2023   Centrilobular emphysema (HCC) 11/26/2022   Lumbar herniated disc 09/15/2022   Bilateral sciatica 09/15/2022   Hospital discharge follow-up 04/30/2022   Atypical chest pain 04/23/2022   Current smoker 07/22/2021   Iron deficiency anemia 07/22/2021   PAD (peripheral artery disease) 07/22/2021   Allergic rhinitis 07/22/2021   Insomnia 12/12/2019   Bilateral carpal tunnel syndrome 11/23/2018   Entrapment of right ulnar nerve 11/23/2018   Mixed hyperlipidemia 08/27/2015   Vitamin D  deficiency 08/27/2015   Type 2 diabetes mellitus with other specified complication (HCC) 02/20/2014   Mild protein-calorie malnutrition 02/20/2014   Fatty liver 09/21/2012   Essential hypertension 01/07/2011    PCP:  Tobie Suzzane POUR, MD  REFERRING PROVIDER: Terry Wilhelmena Lloyd Hilario REFERRING DIAG:  Diagnosis  M51.361 (ICD-10-CM) - Degeneration of intervertebral disc of lumbar region with lower extremity pain    Rationale for Evaluation and Treatment: Rehabilitation  THERAPY DIAG:  Low back pain, unspecified back pain laterality, unspecified chronicity, unspecified whether sciatica present  Muscle  weakness (generalized)  Difficulty in walking, not elsewhere classified  Pain in right leg  ONSET DATE: 3 weeks ago  SUBJECTIVE:                                                                                                                                                                                           SUBJECTIVE STATEMENT: Back pain for about 3 weeks that goes into right leg only.  Does some heavy lifting sometimes with a hydraulic lift and thinks that is what set it off.  Has come in before for back pain after lifting that hydraulic lift; weighs about 50 lbs.    PERTINENT HISTORY:  Here previously for back pain several years ago DM HTN Lumbar surgery several years ago; not a fusion  PAIN:  Are you having pain? Yes: NPRS scale: 0/10 Pain location: right hip; glute area Pain description: sore and tender Aggravating factors: sit to stand Relieving factors: walk around  PRECAUTIONS: None  WEIGHT BEARING RESTRICTIONS: No  FALLS:  Has patient fallen in last 6 months? 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  DIAGNOSTIC FINDINGS:  None recent  PATIENT SURVEYS:  Modified Oswestry:  MODIFIED OSWESTRY DISABILITY SCALE  Date: 06/15/2024 Score  Pain intensity 0 = I can tolerate the pain I have without having to use pain medication.  2. Personal care (washing, dressing, etc.) 0 =  I can take care of myself normally without causing increased pain.  3. Lifting 2 = Pain prevents me from lifting heavy weights off the floor, activities (eg. sports, dancing). but I can manage if the weights are conveniently positioned (3) Pain prevents me from going out very often. (eg, on a table).  4. Walking 2 =  Pain prevents me from walking more than  mile.  5. Sitting 2 =   Pain prevents me from sitting more than 1 hour.  6. Standing 3 =  Pain prevents me from standing more than 1/2 hour.  7. Sleeping 2 =  Even when I take pain medication, I sleep less than 6 hours  8.  Social Life 3 =  Pain prevents me from going out very often.  9. Traveling 3 = My pain restricts my travel over 1 hour  10. Employment/ Homemaking 1 = My normal homemaking/job activities increase my pain, but I can still perform all that is required of me  Total 18/50; 36%   Interpretation of scores: Score Category Description  0-20% Minimal Disability The patient can cope with most living activities. Usually no treatment is indicated apart from advice on lifting, sitting and exercise  21-40% Moderate Disability The patient experiences more pain and difficulty with sitting, lifting and standing. Travel and social life are more difficult and they may be disabled from work. Personal care, sexual activity and sleeping are not grossly affected, and the patient can usually be managed by conservative means  41-60% Severe Disability Pain remains the main problem in this group, but activities of daily living are affected. These patients require a detailed investigation  61-80% Crippled Back pain impinges on all aspects of the patient's life. Positive intervention is required  81-100% Bed-bound  These patients are either bed-bound or exaggerating their symptoms  Bluford FORBES Zoe DELENA Karon DELENA, et al. Surgery versus conservative management of stable thoracolumbar fracture: the PRESTO feasibility RCT. [place] ([place]): [employer]; 2021 Nov. [facility] Technology Assessment, No. 25.62.) Appendix 3, Oswestry Disability Index category descriptors. Available from: FindJewelers.cz  Minimally Clinically Important Difference (MCID) = 12.8%  COGNITION: Overall cognitive status: Within functional limits for tasks assessed     SENSATION: WFL   POSTURE: rounded shoulders, forward  head, and right pelvic obliquity  PALPATION: Curve noted mid spine; tight paraspinals right > left; slight rib hump right versus left; right shoulder and hip elevated  LUMBAR ROM: *pain  AROM eval  Flexion Fingertips to mid shin* pulling right leg  Extension 30% available some soreness in low back  Right lateral flexion   Left lateral flexion   Right rotation   Left rotation    (Blank rows = not tested)  LOWER EXTREMITY ROM:     Active  Right eval Left eval  Hip flexion 4 4+  Hip extension 4- 4+  Hip abduction    Hip adduction    Hip internal rotation    Hip external rotation    Knee flexion 4 4+  Knee extension 4 5  Ankle dorsiflexion 4 5  Ankle plantarflexion    Ankle inversion    Ankle eversion     (Blank rows = not tested)  LOWER EXTREMITY MMT:    MMT Right eval Left eval  Hip flexion    Hip extension    Hip abduction    Hip adduction    Hip internal rotation    Hip external rotation    Knee flexion    Knee extension    Ankle dorsiflexion    Ankle plantarflexion    Ankle inversion    Ankle eversion     (Blank rows = not tested)    FUNCTIONAL TESTS:  5 times sit to stand: 15.37 sec no UE assist  GAIT: Distance walked: 60 ft in clinic Assistive device utilized: None Level of assistance: Complete Independence Comments: no balance issues noted  TREATMENT DATE: 06/15/24 physical therapy evaluation and HEP instruction  PATIENT EDUCATION:  Education details: Patient educated on exam findings, POC, scope of PT, HEP, and what to expect next visit. Person educated: Patient Education method: Explanation, Demonstration, and Handouts Education comprehension: verbalized understanding, returned demonstration, verbal cues required, and tactile cues required  HOME EXERCISE PROGRAM: Decompression exercises  1-5  ASSESSMENT:  CLINICAL IMPRESSION: Patient is a 73 y.o. male who was seen today for physical therapy evaluation and treatment for Degeneration of intervertebral disc of lumbar region with lower extremity pain.  Patient demonstrates muscle weakness, reduced ROM, and fascial restrictions which are likely contributing to symptoms of pain and are negatively impacting patient ability to perform ADLs and functional mobility tasks. Patient will benefit from skilled physical therapy services to address these deficits to reduce pain and improve level of function with ADLs and functional mobility tasks.  .   OBJECTIVE IMPAIRMENTS: decreased activity tolerance, decreased ROM, decreased strength, and pain.   ACTIVITY LIMITATIONS: lifting, bending, and sitting  PARTICIPATION LIMITATIONS: cleaning and driving  REHAB POTENTIAL: Good  CLINICAL DECISION MAKING: Evolving/moderate complexity  EVALUATION COMPLEXITY: Moderate   GOALS: Goals reviewed with patient? No  SHORT TERM GOALS: Target date: 06/29/24  patient will be independent with initial HEP  Baseline: Goal status: INITIAL  2.  Patient will report 50% improvement overall  Baseline:  Goal status: INITIAL  PLAN:  PT FREQUENCY: 1x/week  PT DURATION: 1 week  PLANNED INTERVENTIONS: 97164- PT Re-evaluation, 97110-Therapeutic exercises, 97530- Therapeutic activity, 97112- Neuromuscular re-education, 97535- Self Care, 02859- Manual therapy, [id number]- Gait training, [phone]- Orthotic Fit/training, [phone]- Canalith repositioning, [id number]- Aquatic Therapy, [phone]- Splinting, [phone]- Wound care (first 20 sq cm), 97598- Wound care (each additional 20 sq cm)Patient/Family education, Balance training, Stair training, Taping, Dry Needling, Joint mobilization, Joint manipulation, Spinal manipulation, Spinal mobilization, Scar mobilization, and DME instructions. SABRA  PLAN FOR NEXT SESSION: one visit only for HEP   2:16 PM, 06/15/24 Klani Caridi Small Manessa Buley  MPT [place] [facility] [place] [phone]

## 2024-06-20 ENCOUNTER — Telehealth: Payer: Self-pay | Admitting: Internal Medicine

## 2024-06-20 NOTE — Telephone Encounter (Signed)
 Prescription Request  06/20/2024  LOV: 02/29/2024  What is the name of the medication or equipment? albuterol  (VENTOLIN  HFA) 108 (90 Base) MCG/ACT inhaler [[id number]]   Have you contacted your pharmacy to request a refill? Yes   Which pharmacy would you like this sent to? [facility] [address] [place]  Patient notified that their request is being sent to the clinical staff for review and that they should receive a response within 2 business days.   Please advise at walked into office

## 2024-06-21 ENCOUNTER — Other Ambulatory Visit: Payer: Self-pay

## 2024-06-21 DIAGNOSIS — J432 Centrilobular emphysema: Secondary | ICD-10-CM

## 2024-06-21 MED ORDER — ALBUTEROL SULFATE HFA 108 (90 BASE) MCG/ACT IN AERS: 2.0000 | INHALATION_SPRAY | Freq: Four times a day (QID) | RESPIRATORY_TRACT | 0 refills | Status: DC | PRN

## 2024-06-21 NOTE — Telephone Encounter (Signed)
 Refill has been sent.

## 2024-07-11 NOTE — Progress Notes (Deleted)
 New Patient Pulmonology Office Visit   Subjective:  Patient ID: Chase Briggs, male    DOB: 12-22-1950  MRN: [id number]  Referred by: Tobie Suzzane POUR, MD  CC: No chief complaint on file.   HPI Chase Briggs is a 73 y.o. male with HTN, HLD, GERD, nicotine  dependence with current use who is here for initial consultation.    {PULM QUESTIONNAIRES (Optional):33196}  ROS  Allergies: Patient has no known allergies.  Current Outpatient Medications:    albuterol  (VENTOLIN  HFA) 108 (90 Base) MCG/ACT inhaler, Inhale 2 puffs into the lungs every 6 (six) hours as needed for wheezing or shortness of breath., Disp: 18 g, Rfl: 0   amLODipine  (NORVASC ) 10 MG tablet, TAKE 1 TABLET(10 MG) BY MOUTH DAILY, Disp: 90 tablet, Rfl: 3   aspirin  EC 81 MG tablet, Take 1 tablet (81 mg total) by mouth daily with breakfast., Disp: 30 tablet, Rfl: 11   atorvastatin  (LIPITOR) 10 MG tablet, TAKE 1 TABLET(10 MG) BY MOUTH DAILY, Disp: 90 tablet, Rfl: 1   Cholecalciferol (VITAMIN D ) 125 MCG (5000 UT) CAPS, Take 2,000 Units by mouth daily., Disp: 30 capsule, Rfl: 3   cloNIDine  (CATAPRES ) 0.1 MG tablet, TAKE 1 TABLET(0.1 MG) BY MOUTH TWICE DAILY, Disp: 180 tablet, Rfl: 3   clopidogrel  (PLAVIX ) 75 MG tablet, TAKE 1 TABLET BY MOUTH DAILY, Disp: 90 tablet, Rfl: 3   EPINEPHrine  0.3 mg/0.3 mL IJ SOAJ injection, Inject 0.3 mg into the muscle as needed for anaphylaxis., Disp: 1 each, Rfl: 1   famotidine  (PEPCID ) 20 MG tablet, Take 1 tablet (20 mg total) by mouth 2 (two) times daily., Disp: 60 tablet, Rfl: 2   ferrous sulfate  325 (65 FE) MG EC tablet, Take 325 mg by mouth daily with breakfast., Disp: , Rfl:    glipiZIDE  (GLUCOTROL  XL) 2.5 MG 24 hr tablet, TAKE 1 TABLET(2.5 MG) BY MOUTH DAILY WITH BREAKFAST, Disp: 90 tablet, Rfl: 0   glucose blood test strip, 1 each by Other route 2 (two) times daily. Use as instructed bid E11.65 Relion Premier, Disp: 100 each, Rfl: 5   insulin  glargine (LANTUS  SOLOSTAR) 100  UNIT/ML Solostar Pen, Inject 12 Units into the skin at bedtime., Disp: 15 mL, Rfl: 0   Lancets MISC, 1 each by Does not apply route 2 (two) times daily. E11.65 Relion Premier, Disp: 100 each, Rfl: 5   lisinopril  (ZESTRIL ) 40 MG tablet, TAKE 1 TABLET(40 MG) BY MOUTH DAILY, Disp: 90 tablet, Rfl: 1   mirtazapine  (REMERON ) 15 MG tablet, TAKE 1 TABLET(15 MG) BY MOUTH AT BEDTIME, Disp: 30 tablet, Rfl: 3   mupirocin  ointment (BACTROBAN ) 2 %, Apply 1 Application topically 2 (two) times daily. Over upper lip area., Disp: 22 g, Rfl: 0   SURE COMFORT PEN NEEDLES 32G X 4 MM MISC, USE ONCE DAILY, Disp: 100 each, Rfl: 3   tiZANidine  (ZANAFLEX ) 4 MG tablet, Take 1 tablet (4 mg total) by mouth 2 (two) times daily as needed for muscle spasms., Disp: 60 tablet, Rfl: 2 Past Medical History:  Diagnosis Date   Diabetes mellitus    Fatty liver    GERD (gastroesophageal reflux disease)    HTN (hypertension)    Hyperlipidemia    Insomnia    Renal insufficiency    Past Surgical History:  Procedure Laterality Date   ABDOMINAL AORTOGRAM W/LOWER EXTREMITY N/A 08/22/2018   Procedure: ABDOMINAL AORTOGRAM W/LOWER EXTREMITY;  Surgeon: Sheree Penne Bruckner, MD;  Location: [facility] INVASIVE CV LAB;  Service: Cardiovascular;  Laterality: N/A;  BACK SURGERY     CARPAL TUNNEL WITH CUBITAL TUNNEL Right 03/21/2019   Procedure: RIGHT CARPAL TUNNEL AND CUBITAL  TUNNEL RELEASES;  Surgeon: Murrell Kuba, MD;  Location: [facility];  Service: Orthopedics;  Laterality: Right;  AXILLARY BLOCK   COLONOSCOPY  01/2007   Dr. Mavis, reviewed report, mild diverticulosis. Prep adequate. Next TCS due 01/2017   ESOPHAGOGASTRODUODENOSCOPY N/A 10/21/2012   DOQ:FNIZMJUZ GASTRITIS   MASS EXCISION Right 10/26/2012   Procedure: EXCISION MASS;  Surgeon: Oneil DELENA Mavis, MD;  Location: [facility];  Service: General;  Laterality: Right;   PERIPHERAL VASCULAR INTERVENTION  08/22/2018   Procedure: PERIPHERAL VASCULAR INTERVENTION;  Surgeon: Sheree Penne Bruckner, MD;  Location: [facility] INVASIVE CV LAB;  Service: Cardiovascular;;  bilateral external iliac stents   scalp mass  4-5 yrs ago   Dr Mavis COWBOY NERVE TRANSPOSITION Right 03/21/2019   Procedure: DECOMPRESSION RIGHT ULNAR NERVE;  Surgeon: Murrell Kuba, MD;  Location: [facility];  Service: Orthopedics;  Laterality: Right;   Family History  Problem Relation Age of Onset   Diabetes Mother    Diabetes Father    Diabetes Brother    Colon cancer Neg Hx    Liver disease Neg Hx    Social History   Socioeconomic History   Marital status: Married    Spouse name: Not on file   Number of children: 2   Years of education: Not on file   Highest education level: Not on file  Occupational History   Occupation: reitred    Comment: [employer], heavy lifting  Tobacco Use   Smoking status: Every Day    Current packs/day: 0.50    Average packs/day: 0.5 packs/day for 54.8 years (27.4 ttl pk-yrs)    Types: Cigarettes    Start date: 42   Smokeless tobacco: Never  Vaping Use   Vaping status: Never Used  Substance and Sexual Activity   Alcohol use: No    Comment: hx heavy etoh x 3 yrs, quit 30+ yrs ago   Drug use: No   Sexual activity: Yes    Birth control/protection: None  Other Topics Concern   Not on file  Social History Narrative   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 Highest level of education: 12th grade. Married for 12 years. Has 2 sons (33 and 21). Lives with wife.   Social Drivers of Corporate Investment Banker Strain: Low Risk  (05/25/2024)   Overall Financial Resource Strain (CARDIA)    Difficulty of Paying Living Expenses: Not hard at all  Food Insecurity: No Food Insecurity (05/25/2024)   Hunger Vital Sign    Worried About Running Out of Food in the Last Year: Never true    Ran Out of Food in the Last Year: Never true  Transportation Needs: No Transportation Needs (05/25/2024)   PRAPARE - Administrator, Civil Service (Medical): No     Lack of Transportation (Non-Medical): No  Physical Activity: Sufficiently Active (05/25/2024)   Exercise Vital Sign    Days of Exercise per Week: 7 days    Minutes of Exercise per Session: 30 min  Stress: No Stress Concern Present (05/25/2024)   [employer] of Occupational Health - Occupational Stress Questionnaire    Feeling of Stress: Not at all  Social Connections: Socially Integrated (05/25/2024)   Social Connection and Isolation Panel    Frequency of Communication with Friends and Family: More than three times a week    Frequency  of Social Gatherings with Friends and Family: More than three times a week    Attends Religious Services: More than 4 times per year    Active Member of [employer] or Organizations: Yes    Attends Banker Meetings: More than 4 times per year    Marital Status: Married  Catering Manager Violence: Not At Risk (05/25/2024)   Humiliation, Afraid, Rape, and Kick questionnaire    Fear of Current or Ex-Partner: No    Emotionally Abused: No    Physically Abused: No    Sexually Abused: No       Objective:  There were no vitals taken for this visit. {Pulm Vitals (Optional):32837}  Physical Exam  Diagnostic Review:  {Labs (Optional):32838}  CXR 01/2024: hyperinflation  LDCT 08/2022 reviewed independently and shows upper lobe emphysematous changes, no nodules. IMPRESSION: 1. Lung-RADS 2S, benign appearance or behavior. Continue annual screening with low-dose chest CT without contrast in 12 months. S modifier for coronary artery calcifications. 2. Mild coronary artery calcifications, recommend ASCVD risk assessment. 3. Aortic Atherosclerosis (ICD10-I70.0) and Emphysema (ICD10-J43.9).    Assessment & Plan:   Assessment & Plan   No orders of the defined types were placed in this encounter.     No follow-ups on file.   Akayla Brass, MD

## 2024-07-12 ENCOUNTER — Ambulatory Visit: Admitting: Pulmonary Disease

## 2024-07-12 NOTE — Progress Notes (Signed)
 "  New Patient Pulmonology Office Visit   Subjective:  Patient ID: Chase Briggs, male    DOB: Jan 21, 1951  MRN: [id number]  Referred by: Tobie Suzzane POUR, MD  CC:  Chief Complaint  Patient presents with   Establish Care    LCS Doe / cough    HPI Chase Briggs is a 73 y.o. male with HTN, HLD, GERD, nicotine  dependence with current use who is here for initial consultation.  Discussed the use of AI scribe software for clinical note transcription with the patient, who gave verbal consent to proceed.  History of Present Illness Chase Briggs is a 73 year old male who presents with shortness of breath. He was referred by his primary care physician for evaluation of his respiratory symptoms and consideration for lung cancer screening.  He has experienced shortness of breath for over a year, which worsens with physical exertion, especially when walking on inclines. Walking on level ground does not cause significant issues, but inclines lead to shortness of breath and hip pain, sometimes necessitating stopping. No shortness of breath while sitting or lying flat, except occasionally at night when getting up to use the restroom, with some relief after repositioning.  He has a cough that occurs occasionally, particularly after smoking a cigarette, but denies producing phlegm. Wheezing is noted while sleeping. He has not had episodes of bronchitis or pneumonia in the past and has not been on antibiotics or prednisone  for respiratory issues. He was prescribed albuterol  by his primary care physician but has only used it twice without significant relief.  He has a significant smoking history, having smoked for approximately 50 years, starting in his twenties. He currently smokes about half a pack per day, though he used to smoke up to a pack a day. He has attempted to quit smoking in the past using nicotine  patches but found that he smoked more while using them.  His past medical  history includes hypertension and diabetes. He has undergone back surgery and minor surgeries for carpal tunnel syndrome and removal of skin lesions. He denies any known allergies, though mowing grass causes his nose to burn. He reports receiving flu and pneumonia vaccinations, most likely this year, based on his recollection and his partner's input. He reports a recent weight loss, currently weighing 128 pounds, which he attributes to a decreased appetite.  CAT > 10, MMRC > 2 No recurrent exacerbations.  ROS  Allergies: Patient has no known allergies.  Current Outpatient Medications:    albuterol  (VENTOLIN  HFA) 108 (90 Base) MCG/ACT inhaler, Inhale 2 puffs into the lungs every 6 (six) hours as needed for wheezing or shortness of breath., Disp: 18 g, Rfl: 0   amLODipine  (NORVASC ) 10 MG tablet, TAKE 1 TABLET(10 MG) BY MOUTH DAILY, Disp: 90 tablet, Rfl: 3   aspirin  EC 81 MG tablet, Take 1 tablet (81 mg total) by mouth daily with breakfast., Disp: 30 tablet, Rfl: 11   atorvastatin  (LIPITOR) 10 MG tablet, TAKE 1 TABLET(10 MG) BY MOUTH DAILY, Disp: 90 tablet, Rfl: 1   Cholecalciferol (VITAMIN D ) 125 MCG (5000 UT) CAPS, Take 2,000 Units by mouth daily., Disp: 30 capsule, Rfl: 3   cloNIDine  (CATAPRES ) 0.1 MG tablet, TAKE 1 TABLET(0.1 MG) BY MOUTH TWICE DAILY, Disp: 180 tablet, Rfl: 3   clopidogrel  (PLAVIX ) 75 MG tablet, TAKE 1 TABLET BY MOUTH DAILY, Disp: 90 tablet, Rfl: 3   EPINEPHrine  0.3 mg/0.3 mL IJ SOAJ injection, Inject 0.3 mg into the muscle as needed for  anaphylaxis., Disp: 1 each, Rfl: 1   famotidine  (PEPCID ) 20 MG tablet, Take 1 tablet (20 mg total) by mouth 2 (two) times daily., Disp: 60 tablet, Rfl: 2   ferrous sulfate  325 (65 FE) MG EC tablet, Take 325 mg by mouth daily with breakfast., Disp: , Rfl:    glipiZIDE  (GLUCOTROL  XL) 2.5 MG 24 hr tablet, TAKE 1 TABLET(2.5 MG) BY MOUTH DAILY WITH BREAKFAST, Disp: 90 tablet, Rfl: 0   glucose blood test strip, 1 each by Other route 2 (two) times  daily. Use as instructed bid E11.65 Relion Premier, Disp: 100 each, Rfl: 5   insulin  glargine (LANTUS  SOLOSTAR) 100 UNIT/ML Solostar Pen, Inject 12 Units into the skin at bedtime., Disp: 15 mL, Rfl: 0   Lancets MISC, 1 each by Does not apply route 2 (two) times daily. E11.65 Relion Premier, Disp: 100 each, Rfl: 5   lisinopril  (ZESTRIL ) 40 MG tablet, TAKE 1 TABLET(40 MG) BY MOUTH DAILY, Disp: 90 tablet, Rfl: 1   mirtazapine  (REMERON ) 15 MG tablet, TAKE 1 TABLET(15 MG) BY MOUTH AT BEDTIME, Disp: 30 tablet, Rfl: 3   mupirocin  ointment (BACTROBAN ) 2 %, Apply 1 Application topically 2 (two) times daily. Over upper lip area., Disp: 22 g, Rfl: 0   SURE COMFORT PEN NEEDLES 32G X 4 MM MISC, USE ONCE DAILY, Disp: 100 each, Rfl: 3   tiZANidine  (ZANAFLEX ) 4 MG tablet, Take 1 tablet (4 mg total) by mouth 2 (two) times daily as needed for muscle spasms., Disp: 60 tablet, Rfl: 2 Past Medical History:  Diagnosis Date   Diabetes mellitus    Fatty liver    GERD (gastroesophageal reflux disease)    HTN (hypertension)    Hyperlipidemia    Insomnia    Renal insufficiency    Past Surgical History:  Procedure Laterality Date   ABDOMINAL AORTOGRAM W/LOWER EXTREMITY N/A 08/22/2018   Procedure: ABDOMINAL AORTOGRAM W/LOWER EXTREMITY;  Surgeon: Sheree Penne Bruckner, MD;  Location: [facility] INVASIVE CV LAB;  Service: Cardiovascular;  Laterality: N/A;   BACK SURGERY     CARPAL TUNNEL WITH CUBITAL TUNNEL Right 03/21/2019   Procedure: RIGHT CARPAL TUNNEL AND CUBITAL  TUNNEL RELEASES;  Surgeon: Murrell Kuba, MD;  Location:  SURGERY CENTER;  Service: Orthopedics;  Laterality: Right;  AXILLARY BLOCK   COLONOSCOPY  01/2007   Dr. Mavis, reviewed report, mild diverticulosis. Prep adequate. Next TCS due 01/2017   ESOPHAGOGASTRODUODENOSCOPY N/A 10/21/2012   DOQ:[id number] GASTRITIS   MASS EXCISION Right 10/26/2012   Procedure: EXCISION MASS;  Surgeon: Oneil DELENA Mavis, MD;  Location: [facility];  Service: General;  Laterality:  Right;   PERIPHERAL VASCULAR INTERVENTION  08/22/2018   Procedure: PERIPHERAL VASCULAR INTERVENTION;  Surgeon: Sheree Penne Bruckner, MD;  Location: [facility] INVASIVE CV LAB;  Service: Cardiovascular;;  bilateral external iliac stents   scalp mass  4-5 yrs ago   Dr Mavis COWBOY NERVE TRANSPOSITION Right 03/21/2019   Procedure: DECOMPRESSION RIGHT ULNAR NERVE;  Surgeon: Murrell Kuba, MD;  Location:  SURGERY CENTER;  Service: Orthopedics;  Laterality: Right;   Family History  Problem Relation Age of Onset   Diabetes Mother    Diabetes Father    Diabetes Brother    Colon cancer Neg Hx    Liver disease Neg Hx    Social History   Socioeconomic History   Marital status: Married    Spouse name: Not on file   Number of children: 2   Years of education: Not on file   Highest education  level: Not on file  Occupational History   Occupation: reitred    Comment: [employer], heavy lifting  Tobacco Use   Smoking status: Every Day    Current packs/day: 0.50    Average packs/day: 0.5 packs/day for 54.8 years (27.4 ttl pk-yrs)    Types: Cigarettes    Start date: 38   Smokeless tobacco: Never  Vaping Use   Vaping status: Never Used  Substance and Sexual Activity   Alcohol use: No    Comment: hx heavy etoh x 3 yrs, quit 30+ yrs ago   Drug use: No   Sexual activity: Yes    Birth control/protection: None  Other Topics Concern   Not on file  Social History Narrative   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 Highest level of education: 12th grade. Married for 12 years. Has 2 sons (33 and 21). Lives with wife.   Social Drivers of Corporate Investment Banker Strain: Low Risk  (05/25/2024)   Overall Financial Resource Strain (CARDIA)    Difficulty of Paying Living Expenses: Not hard at all  Food Insecurity: No Food Insecurity (05/25/2024)   Hunger Vital Sign    Worried About Running Out of Food in the Last Year: Never true    Ran Out of Food in the Last Year: Never true  Transportation  Needs: No Transportation Needs (05/25/2024)   PRAPARE - Administrator, Civil Service (Medical): No    Lack of Transportation (Non-Medical): No  Physical Activity: Sufficiently Active (05/25/2024)   Exercise Vital Sign    Days of Exercise per Week: 7 days    Minutes of Exercise per Session: 30 min  Stress: No Stress Concern Present (05/25/2024)   [employer] of Occupational Health - Occupational Stress Questionnaire    Feeling of Stress: Not at all  Social Connections: Socially Integrated (05/25/2024)   Social Connection and Isolation Panel    Frequency of Communication with Friends and Family: More than three times a week    Frequency of Social Gatherings with Friends and Family: More than three times a week    Attends Religious Services: More than 4 times per year    Active Member of [employer] or Organizations: Yes    Attends Banker Meetings: More than 4 times per year    Marital Status: Married  Catering Manager Violence: Not At Risk (05/25/2024)   Humiliation, Afraid, Rape, and Kick questionnaire    Fear of Current or Ex-Partner: No    Emotionally Abused: No    Physically Abused: No    Sexually Abused: No       Objective:   Vitals:   07/14/24 0923  BP: (!) 159/80  Pulse: 94  SpO2: 93%   Wt Readings from Last 3 Encounters:  07/14/24 130 lb 6.4 oz (59.1 kg)  05/25/24 128 lb (58.1 kg)  04/20/24 128 lb 1.9 oz (58.1 kg)   BMI Readings from Last 3 Encounters:  07/14/24 20.42 kg/m  05/25/24 20.05 kg/m  04/20/24 20.07 kg/m   SpO2 Readings from Last 3 Encounters:  07/14/24 93%  04/20/24 92%  04/10/24 93%    Physical Exam General: NAD, alert, WD, WN Eyes: PERRL, no scleral icterus ENMT: oropharynx clear, good dentition, no oral lesions, mallampati score I  Skin: warm, intact, no rashes Neck: JVD flat, ROM and lymph node assessment normal CV: RRR, no MRG, nl S1 and S2, no peripheral edema Resp: clear to auscultation bilaterally, crackles in  right lower lung field, +  clubbing Neuro: Awake alert oriented to person place time and situation  Diagnostic Review:  Last CBC Lab Results  Component Value Date   WBC 6.2 02/29/2024   HGB 15.6 02/29/2024   HCT 47.0 02/29/2024   MCV 96 02/29/2024   MCH 32.0 02/29/2024   RDW 12.3 02/29/2024   PLT 218 02/29/2024   Last metabolic panel Lab Results  Component Value Date   GLUCOSE 134 (H) 02/29/2024   NA 140 02/29/2024   K 4.8 02/29/2024   CL 101 02/29/2024   CO2 23 02/29/2024   BUN 9 02/29/2024   CREATININE 1.16 02/29/2024   EGFR 67 02/29/2024   CALCIUM  9.9 02/29/2024   PHOS 2.6 06/10/2023   PROT 7.8 02/29/2024   ALBUMIN 4.1 02/29/2024   LABGLOB 3.7 02/29/2024   AGRATIO 1.3 08/18/2022   BILITOT 0.5 02/29/2024   ALKPHOS 129 (H) 02/29/2024   AST 14 02/29/2024   ALT 9 02/29/2024   ANIONGAP 11 07/29/2023   CXR 01/2024: hyperinflation  LDCT 08/2022 reviewed independently and shows upper lobe emphysematous changes, no nodules. IMPRESSION: 1. Lung-RADS 2S, benign appearance or behavior. Continue annual screening with low-dose chest CT without contrast in 12 months. S modifier for coronary artery calcifications. 2. Mild coronary artery calcifications, recommend ASCVD risk assessment. 3. Aortic Atherosclerosis (ICD10-I70.0) and Emphysema (ICD10-J43.9).    Assessment & Plan:   Assessment & Plan Centrilobular emphysema with chronic symptoms (shortness of breath, cough, wheezing) Chronic shortness of breath, cough, and wheezing due to emphysema from smoking. LDCT shows upper lobe emphysematous changes. Albuterol  inhaler provided no significant relief. MMRC > 2, CAT > 10. No recurrent exacerbations. Likely Class B. Grade unknown since patient does not have PFTs. Will start LAMA/LABA with Stiolto 2 puffs daily. - Order PFTs to confirm COPD diagnosis. - Prescribe Stiolto inhaler, two puffs in the morning. - I showed him a sample and counseled him on how to use it. - Continue  albuterol  inhaler as needed for shortness of breath. - Discuss pulmonary rehab program as a future option.  Tobacco use disorder Long-term smoker, currently half a pack per day. 50 PY. Previous nicotine  patch attempts failed. Discussed combination therapy with patches and lozenges. Emphasized quitting due to emphysema. Explained safety and efficacy of combined therapy. Provided cessation resources. - Submit order for nicotine  patches to pharmacy. - Advise obtaining nicotine  lozenges over the counter. - Provide information on 1-800-QUIT-NOW for free resources and support.  Smoking/Tobacco Cessation Counseling Chase Briggs is a current user of tobacco or nicotine  products. He is considering quitting at this time. Counseling provided today addressed the risks of continued use and the benefits of cessation. Discussed tobacco/nicotine  use history, readiness to quit, and evidence-based treatment options including behavioral strategies, support resources, and pharmacologic therapies. Provided encouragement and educational materials on steps and resources to quit smoking. Patient questions were addressed, and follow-up recommended for continued support. Total time spent on counseling: 12 minutes.   Lung Cancer Screening The patient is a current cigarette smoker and has a pack year history of 50 years. They are currently asymptomatic and between the ages of 55 and 34 years old. They meet the criteria for a low dose CT scan can for lung cancer screening. They were counseled regarding the necessity for lung cancer screening and agreeable to have this imaging performed. - Refer to lung cancer screening program for annual scans.  Orders Placed This Encounter  Procedures   Ambulatory Referral for Lung Cancer Scre    Referral Priority:  Routine    Referral Type:   Consultation    Referral Reason:   Specialty Services Required    Number of Visits Requested:   1   Pulmonary function test    Standing  Status:   Future    Expiration Date:   07/14/2025    Where should this test be performed?:   [facility]    What type of PFT is being ordered?:   Full PFT w/o Spirometry post Bronchodilator   I spent 45 minutes reviewing patient's chart including prior consultant notes, imaging, and PFTs as well as face-to-face with the patient, over half in discussion of the diagnosis and the importance of compliance with the treatment plan.  Return in about 3 months (around 10/14/2024).  Cassi Jenne, MD "

## 2024-07-14 ENCOUNTER — Encounter: Payer: Self-pay | Admitting: Pulmonary Disease

## 2024-07-14 ENCOUNTER — Ambulatory Visit: Admitting: Pulmonary Disease

## 2024-07-14 ENCOUNTER — Encounter: Payer: Self-pay | Admitting: Emergency Medicine

## 2024-07-14 ENCOUNTER — Other Ambulatory Visit: Payer: Self-pay | Admitting: "Endocrinology

## 2024-07-14 ENCOUNTER — Other Ambulatory Visit: Payer: Self-pay | Admitting: Family Medicine

## 2024-07-14 VITALS — BP 159/80 | HR 94 | Ht 67.0 in | Wt 130.4 lb

## 2024-07-14 DIAGNOSIS — Z122 Encounter for screening for malignant neoplasm of respiratory organs: Secondary | ICD-10-CM

## 2024-07-14 DIAGNOSIS — J432 Centrilobular emphysema: Secondary | ICD-10-CM | POA: Diagnosis not present

## 2024-07-14 DIAGNOSIS — M545 Low back pain, unspecified: Secondary | ICD-10-CM

## 2024-07-14 DIAGNOSIS — F17210 Nicotine dependence, cigarettes, uncomplicated: Secondary | ICD-10-CM | POA: Diagnosis not present

## 2024-07-14 MED ORDER — NICOTINE 21 MG/24HR TD PT24: MEDICATED_PATCH | TRANSDERMAL | 0 refills | Status: DC

## 2024-07-14 MED ORDER — NICOTINE POLACRILEX 4 MG MT GUM: CHEWING_GUM | OROMUCOSAL | 0 refills | Status: DC

## 2024-07-14 MED ORDER — STIOLTO RESPIMAT 2.5-2.5 MCG/ACT IN AERS: 2.0000 | INHALATION_SPRAY | Freq: Every day | RESPIRATORY_TRACT | Status: DC

## 2024-07-14 MED ORDER — NICOTINE 14 MG/24HR TD PT24: MEDICATED_PATCH | TRANSDERMAL | 0 refills | Status: DC

## 2024-07-14 MED ORDER — NICOTINE 7 MG/24HR TD PT24: MEDICATED_PATCH | TRANSDERMAL | 0 refills | Status: DC

## 2024-07-14 MED ORDER — STIOLTO RESPIMAT 2.5-2.5 MCG/ACT IN AERS: 2.0000 | INHALATION_SPRAY | Freq: Every day | RESPIRATORY_TRACT | 6 refills | Status: DC

## 2024-07-14 NOTE — Patient Instructions (Signed)
  VISIT SUMMARY: During your visit, we discussed your ongoing shortness of breath, cough, and wheezing, which are likely due to emphysema from smoking. We also talked about your smoking habits and the importance of quitting, as well as the need for lung cancer screening.  YOUR PLAN: CENTRILOBULAR EMPHYSEMA WITH CHRONIC SYMPTOMS: You have chronic shortness of breath, cough, and wheezing due to emphysema from smoking. -We will order a breathing test to confirm the COPD diagnosis. -You are prescribed a Cialto inhaler, two puffs in the morning. A sample was provided, and its usage was demonstrated. -Continue using the albuterol  inhaler as needed for shortness of breath. -We discussed the possibility of a pulmonary rehab program in the future.  TOBACCO USE DISORDER: You have a long-term smoking habit, currently smoking half a pack per day. -We will submit an order for nicotine  patches to the pharmacy. -You should obtain nicotine  lozenges over the counter. -We provided information on 1-800-QUIT-NOW for free resources and support.  SCREENING FOR LUNG CANCER: You qualify for annual lung cancer screening due to your age and smoking history. -We will refer you to a lung cancer screening program for annual scans.   Contains text generated by [employer].

## 2024-07-18 ENCOUNTER — Telehealth: Payer: Self-pay | Admitting: Pulmonary Disease

## 2024-07-18 NOTE — Telephone Encounter (Signed)
 Spoke with patient regarding the Tuesday 10/24/24 8:30 am PFT testing at [facility] time is 8:15 am---1st floor registration desk for check in.  Follow up with Dr. Catherine is scheduled for 10/24/24 at 10:00 am.  Will mail information to patient and he voiced his understanding

## 2024-07-24 ENCOUNTER — Other Ambulatory Visit: Payer: Self-pay | Admitting: *Deleted

## 2024-07-24 ENCOUNTER — Telehealth: Payer: Self-pay | Admitting: *Deleted

## 2024-07-24 DIAGNOSIS — Z87891 Personal history of nicotine dependence: Secondary | ICD-10-CM

## 2024-07-24 DIAGNOSIS — Z122 Encounter for screening for malignant neoplasm of respiratory organs: Secondary | ICD-10-CM

## 2024-07-24 DIAGNOSIS — F17210 Nicotine dependence, cigarettes, uncomplicated: Secondary | ICD-10-CM

## 2024-07-24 NOTE — Telephone Encounter (Signed)
 Lung Cancer Screening Narrative/Criteria Questionnaire (Cigarette Smokers Only- No Cigars/Pipes/vapes)   Chase Briggs   SDMV:08/02/24 9:45- [name]                                           Dec 10, 1950              LDCT: 08/03/24 8:30- [facility]    73 y.o.   Phone: [phone]  Lung Screening Narrative (confirm age 64-77 yrs Medicare / 50-80 yrs Private pay insurance)   Insurance information:Tricare/ MCR   Referring Provider:Patel   This screening involves an initial phone call with a team member from our program. It is called a shared decision making visit. The initial meeting is required by insurance and Medicare to make sure you understand the program. This appointment takes about 15-20 minutes to complete. The CT scan will completed at a separate date/time. This scan takes about 5-10 minutes to complete and you may eat and drink before and after the scan.  Criteria questions for Lung Cancer Screening:   Are you a current or former smoker? Current Age began smoking: 20   If you are a former smoker, what year did you quit smoking? (within 15 yrs)   To calculate your smoking history, I need an accurate estimate of how many packs of cigarettes you smoked per day and for how many years. (Not just the number of PPD you are now smoking)   Years smoking 53 x Packs per day 1/2 - 1 = Pack years 40   (at least 20 pack yrs)   (Make sure they understand that we need to know how much they have smoked in the past, not just the number of PPD they are smoking now)  Do you have a personal history of cancer?  No    Do you have a family history of cancer? Yes  (cancer type and and relative) Sister (leukemia)  Are you coughing up blood?  No  Have you had unexplained weight loss of 15 lbs or more in the last 6 months? No  It looks like you meet all criteria.     Additional information: N/A

## 2024-07-31 DIAGNOSIS — B351 Tinea unguium: Secondary | ICD-10-CM | POA: Diagnosis not present

## 2024-07-31 DIAGNOSIS — M79674 Pain in right toe(s): Secondary | ICD-10-CM | POA: Diagnosis not present

## 2024-07-31 DIAGNOSIS — E1142 Type 2 diabetes mellitus with diabetic polyneuropathy: Secondary | ICD-10-CM | POA: Diagnosis not present

## 2024-07-31 DIAGNOSIS — L851 Acquired keratosis [keratoderma] palmaris et plantaris: Secondary | ICD-10-CM | POA: Diagnosis not present

## 2024-07-31 DIAGNOSIS — E1151 Type 2 diabetes mellitus with diabetic peripheral angiopathy without gangrene: Secondary | ICD-10-CM | POA: Diagnosis not present

## 2024-08-01 ENCOUNTER — Encounter: Payer: Self-pay | Admitting: Internal Medicine

## 2024-08-01 ENCOUNTER — Ambulatory Visit (INDEPENDENT_AMBULATORY_CARE_PROVIDER_SITE_OTHER): Admitting: Internal Medicine

## 2024-08-01 VITALS — BP 132/70 | HR 94 | Ht 67.0 in | Wt 132.0 lb

## 2024-08-01 DIAGNOSIS — E1169 Type 2 diabetes mellitus with other specified complication: Secondary | ICD-10-CM

## 2024-08-01 DIAGNOSIS — E1159 Type 2 diabetes mellitus with other circulatory complications: Secondary | ICD-10-CM | POA: Diagnosis not present

## 2024-08-01 DIAGNOSIS — G4700 Insomnia, unspecified: Secondary | ICD-10-CM

## 2024-08-01 DIAGNOSIS — E782 Mixed hyperlipidemia: Secondary | ICD-10-CM

## 2024-08-01 DIAGNOSIS — J432 Centrilobular emphysema: Secondary | ICD-10-CM

## 2024-08-01 DIAGNOSIS — N400 Enlarged prostate without lower urinary tract symptoms: Secondary | ICD-10-CM | POA: Diagnosis not present

## 2024-08-01 DIAGNOSIS — Z23 Encounter for immunization: Secondary | ICD-10-CM

## 2024-08-01 DIAGNOSIS — I739 Peripheral vascular disease, unspecified: Secondary | ICD-10-CM

## 2024-08-01 DIAGNOSIS — E1151 Type 2 diabetes mellitus with diabetic peripheral angiopathy without gangrene: Secondary | ICD-10-CM

## 2024-08-01 DIAGNOSIS — I10 Essential (primary) hypertension: Secondary | ICD-10-CM

## 2024-08-01 DIAGNOSIS — Z794 Long term (current) use of insulin: Secondary | ICD-10-CM

## 2024-08-01 NOTE — Assessment & Plan Note (Signed)
 S/p right and left iliac stent placement Followed by vascular surgery and cardiology On aspirin, Plavix and statin

## 2024-08-01 NOTE — Assessment & Plan Note (Signed)
 Lab Results  Component Value Date   HGBA1C 6.3 02/28/2024   Associated with HTN and PAD Well-controlled with Lantus  12 units every morning and Glipizide  2.5 mg QD, followed by Dr. Monte Antonio Advised to follow diabetic diet On statin and ACEi F/u CMP and lipid panel Diabetic eye exam: Advised to follow up with Ophthalmology for diabetic eye exam

## 2024-08-01 NOTE — Assessment & Plan Note (Signed)
 BP Readings from Last 1 Encounters:  08/01/24 132/70   Elevated today as he has not taken his medications today Well-controlled with amlodipine  10 mg QD, lisinopril  40 mg QD and clonidine  0.1 mg BID at home Followed by cardiology Counseled for compliance with the medications Advised DASH diet and moderate exercise/walking, at least 150 mins/week

## 2024-08-01 NOTE — Assessment & Plan Note (Signed)
 On Lipitor 10 mg QD Check lipid profile

## 2024-08-01 NOTE — Assessment & Plan Note (Signed)
 CT abdomen in 2024 showed ring enhancing prostatic mass Had prostate biopsy on 07/29/23 - was bengin Followed by urology Check PSA

## 2024-08-01 NOTE — Assessment & Plan Note (Addendum)
 Better with Remeron  15 mg at bedtime Sleep hygiene material provided Has tried trazodone  100 mg nightly, still had difficulty maintaining sleep

## 2024-08-01 NOTE — Assessment & Plan Note (Signed)
 Overall better controlled, has mild dyspnea on exertion Has Albuterol  PRN for dyspnea or wheezing Has Stiolto as maintenance inhaler, was evaluated by pulmonology- Dr. Alghanim

## 2024-08-01 NOTE — Patient Instructions (Addendum)
 Please continue to take medications as prescribed.  Please continue to follow low salt diet and perform moderate exercise/walking as tolerated.  Please get fasting blood tests done before specialist visits.  Please consider getting Shingrix and Tdap vaccine at local pharmacy.

## 2024-08-01 NOTE — Progress Notes (Signed)
 Established Patient Office Visit  Subjective:  Patient ID: Chase Briggs, male    DOB: 10-08-1950  Age: 73 y.o. MRN: [id number]  CC:  Chief Complaint  Patient presents with   Hypertension    Follow up   Insomnia    Follow up     HPI Chase Briggs is a 73 y.o. male with past medical history of HTN, PAD, allergic rhinitis, type II DM, HLD, insomnia, anemia and tobacco abuse who presents for f/u of his chronic medical conditions.  HTN: His blood pressure was elevated today, but remains well-controlled at home. He has not taken his medicines today. He takes amlodipine  10 mg QD, lisinopril  40 mg QD and clonidine  0.1 mg BID.  Denies any chest pain currently.  He reports intermittent exertional dyspnea and palpitations.  Followed by cardiology as well.  He has been taking Remeron  for improving appetite and sleep quality.  He still has not started protein supplement.  His appetite has improved, but he reports that he is picky eater. He has gained about 10 lbs since 11/24.  Prostatic mass: He had biopsy of prostate in 11/24, which was benign.  He denies any dysuria, hematuria, urinary hesitancy or resistance.  PAD: Has had right and left iliac stent placement, followed by vascular surgery.  He takes aspirin , Plavix  and statin.  Denies any recent worsening of claudication symptoms.  Type II DM with HLD: He takes Lantus  12 units in the morning and glipizide  2.5 mg QD, followed by Dr. Lenis. HbA1c was 6.3 in 06/25.  Denies any fatigue, polyuria or polydipsia.  He takes Lipitor for HLD. Denies any fever, chills, chronic cough, hemoptysis, or night sweats.    Past Medical History:  Diagnosis Date   Diabetes mellitus    Fatty liver    GERD (gastroesophageal reflux disease)    HTN (hypertension)    Hyperlipidemia    Insomnia    Renal insufficiency     Past Surgical History:  Procedure Laterality Date   ABDOMINAL AORTOGRAM W/LOWER EXTREMITY N/A 08/22/2018   Procedure:  ABDOMINAL AORTOGRAM W/LOWER EXTREMITY;  Surgeon: Sheree Penne Bruckner, MD;  Location: [facility] INVASIVE CV LAB;  Service: Cardiovascular;  Laterality: N/A;   BACK SURGERY     CARPAL TUNNEL WITH CUBITAL TUNNEL Right 03/21/2019   Procedure: RIGHT CARPAL TUNNEL AND CUBITAL  TUNNEL RELEASES;  Surgeon: Murrell Kuba, MD;  Location: [facility];  Service: Orthopedics;  Laterality: Right;  AXILLARY BLOCK   COLONOSCOPY  01/2007   Dr. Mavis, reviewed report, mild diverticulosis. Prep adequate. Next TCS due 01/2017   ESOPHAGOGASTRODUODENOSCOPY N/A 10/21/2012   DOQ:[id number] GASTRITIS   MASS EXCISION Right 10/26/2012   Procedure: EXCISION MASS;  Surgeon: Oneil DELENA Mavis, MD;  Location: [facility];  Service: General;  Laterality: Right;   PERIPHERAL VASCULAR INTERVENTION  08/22/2018   Procedure: PERIPHERAL VASCULAR INTERVENTION;  Surgeon: Sheree Penne Bruckner, MD;  [facility] INVASIVE CV LAB;  Service: Cardiovascular;;  bilateral external iliac stents   scalp mass  4-5 yrs ago   Dr Mavis COWBOY NERVE TRANSPOSITION Right 03/21/2019   Procedure: DECOMPRESSION RIGHT ULNAR NERVE;  Surgeon: Murrell Kuba, MD;  Location: [facility];  Service: Orthopedics;  Laterality: Right;    Family History  Problem Relation Age of Onset   Diabetes Mother    Diabetes Father    Diabetes Brother    Colon cancer Neg Hx    Liver disease Neg Hx     Social History  Socioeconomic History   Marital status: Married    Spouse name: Not on file   Number of children: 2   Years of education: Not on file   Highest education level: Not on file  Occupational History   Occupation: reitred    Comment: [employer], heavy lifting  Tobacco Use   Smoking status: Every Day    Current packs/day: 0.50    Average packs/day: 0.5 packs/day for 54.9 years (27.4 ttl pk-yrs)    Types: Cigarettes    Start date: 73   Smokeless tobacco: Never  Vaping Use   Vaping status: Never Used  Substance and Sexual Activity    Alcohol use: No    Comment: hx heavy etoh x 3 yrs, quit 30+ yrs ago   Drug use: No   Sexual activity: Yes    Birth control/protection: None  Other Topics Concern   Not on file  Social History Narrative   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 Highest level of education: 12th grade. Married for 12 years. Has 2 sons (33 and 21). Lives with wife.   Social Drivers of Corporate Investment Banker Strain: Low Risk  (05/25/2024)   Overall Financial Resource Strain (CARDIA)    Difficulty of Paying Living Expenses: Not hard at all  Food Insecurity: No Food Insecurity (05/25/2024)   Hunger Vital Sign    Worried About Running Out of Food in the Last Year: Never true    Ran Out of Food in the Last Year: Never true  Transportation Needs: No Transportation Needs (05/25/2024)   PRAPARE - Administrator, Civil Service (Medical): No    Lack of Transportation (Non-Medical): No  Physical Activity: Sufficiently Active (05/25/2024)   Exercise Vital Sign    Days of Exercise per Week: 7 days    Minutes of Exercise per Session: 30 min  Stress: No Stress Concern Present (05/25/2024)   [employer] of Occupational Health - Occupational Stress Questionnaire    Feeling of Stress: Not at all  Social Connections: Socially Integrated (05/25/2024)   Social Connection and Isolation Panel    Frequency of Communication with Friends and Family: More than three times a week    Frequency of Social Gatherings with Friends and Family: More than three times a week    Attends Religious Services: More than 4 times per year    Active Member of [employer] or Organizations: Yes    Attends Engineer, Structural: More than 4 times per year    Marital Status: Married  Catering Manager Violence: Not At Risk (05/25/2024)   Humiliation, Afraid, Rape, and Kick questionnaire    Fear of Current or Ex-Partner: No    Emotionally Abused: No    Physically Abused: No    Sexually Abused: No    Outpatient Medications  Prior to Visit  Medication Sig Dispense Refill   albuterol  (VENTOLIN  HFA) 108 (90 Base) MCG/ACT inhaler Inhale 2 puffs into the lungs every 6 (six) hours as needed for wheezing or shortness of breath. 18 g 0   amLODipine  (NORVASC ) 10 MG tablet TAKE 1 TABLET(10 MG) BY MOUTH DAILY 90 tablet 3   aspirin  EC 81 MG tablet Take 1 tablet (81 mg total) by mouth daily with breakfast. 30 tablet 11   atorvastatin  (LIPITOR) 10 MG tablet TAKE 1 TABLET(10 MG) BY MOUTH DAILY 90 tablet 1   Cholecalciferol (VITAMIN D ) 125 MCG (5000 UT) CAPS Take 2,000 Units by mouth daily. 30 capsule 3   cloNIDine  (CATAPRES )  0.1 MG tablet TAKE 1 TABLET(0.1 MG) BY MOUTH TWICE DAILY 180 tablet 3   clopidogrel  (PLAVIX ) 75 MG tablet TAKE 1 TABLET BY MOUTH DAILY 90 tablet 3   EPINEPHrine  0.3 mg/0.3 mL IJ SOAJ injection Inject 0.3 mg into the muscle as needed for anaphylaxis. 1 each 1   famotidine  (PEPCID ) 20 MG tablet Take 1 tablet (20 mg total) by mouth 2 (two) times daily. 60 tablet 2   ferrous sulfate  325 (65 FE) MG EC tablet Take 325 mg by mouth daily with breakfast.     glipiZIDE  (GLUCOTROL  XL) 2.5 MG 24 hr tablet TAKE 1 TABLET(2.5 MG) BY MOUTH DAILY WITH BREAKFAST 90 tablet 0   glucose blood test strip 1 each by Other route 2 (two) times daily. Use as instructed bid E11.65 Relion Premier 100 each 5   insulin  glargine (LANTUS  SOLOSTAR) 100 UNIT/ML Solostar Pen Inject 12 Units into the skin at bedtime. 15 mL 0   Lancets MISC 1 each by Does not apply route 2 (two) times daily. E11.65 Relion Premier 100 each 5   lisinopril  (ZESTRIL ) 40 MG tablet TAKE 1 TABLET(40 MG) BY MOUTH DAILY 90 tablet 1   mirtazapine  (REMERON ) 15 MG tablet TAKE 1 TABLET(15 MG) BY MOUTH AT BEDTIME 30 tablet 3   mupirocin  ointment (BACTROBAN ) 2 % Apply 1 Application topically 2 (two) times daily. Over upper lip area. 22 g 0   nicotine  (NICODERM CQ  - DOSED IN MG/24 HOURS) 14 mg/24hr patch RX #2 Weeks 5-6: 14 mg x 1 patch daily. Wear for 24 hours. If you have sleep  disturbances, remove at bedtime. 14 patch 0   nicotine  (NICODERM CQ  - DOSED IN MG/24 HOURS) 21 mg/24hr patch RX #1 Weeks 1-4: 21 mg x 1 patch daily. Wear for 24 hours. If you have sleep disturbances, remove at bedtime. 28 patch 0   nicotine  (NICODERM CQ  - DOSED IN MG/24 HR) 7 mg/24hr patch RX #3 Weeks 7-8: 7 mg x 1 patch daily. Wear for 24 hours. If you have sleep disturbances, remove at bedtime. 14 patch 0   nicotine  polacrilex (NICORETTE ) 4 MG gum RX #1 Weeks 1-6: 1 piece every 1-2 hours.Use at least 9 pieces of gum per day for the first 6 weeks. Max 24 pieces per day. 1008 each 0   SURE COMFORT PEN NEEDLES 32G X 4 MM MISC USE ONCE DAILY 100 each 3   Tiotropium Bromide-Olodaterol (STIOLTO RESPIMAT ) 2.5-2.5 MCG/ACT AERS Inhale 2 puffs into the lungs daily. 4 each 6   Tiotropium Bromide-Olodaterol (STIOLTO RESPIMAT ) 2.5-2.5 MCG/ACT AERS Inhale 2 puffs into the lungs daily.     tiZANidine  (ZANAFLEX ) 4 MG tablet TAKE 1 TABLET(4 MG) BY MOUTH TWICE DAILY AS NEEDED FOR MUSCLE SPASMS 60 tablet 2   No facility-administered medications prior to visit.    No Known Allergies  ROS Review of Systems  Constitutional:  Positive for fatigue. Negative for chills and fever.  HENT:  Negative for congestion, postnasal drip and sore throat.   Respiratory:  Negative for cough and shortness of breath.   Cardiovascular:  Negative for chest pain and palpitations.  Gastrointestinal:  Positive for constipation. Negative for diarrhea and vomiting.  Genitourinary:  Negative for dysuria and hematuria.  Musculoskeletal:  Positive for back pain. Negative for neck pain and neck stiffness.  Skin:  Negative for rash.  Neurological:  Negative for dizziness and weakness.  Psychiatric/Behavioral:  Positive for sleep disturbance. Negative for agitation and behavioral problems.       Objective:  Physical Exam Vitals reviewed.  Constitutional:      General: He is not in acute distress.    Appearance: He is not  diaphoretic.  HENT:     Head: Normocephalic and atraumatic.     Nose: No congestion.     Mouth/Throat:     Mouth: Mucous membranes are moist.  Eyes:     General: No scleral icterus.    Extraocular Movements: Extraocular movements intact.  Cardiovascular:     Rate and Rhythm: Normal rate. Rhythm irregular.     Heart sounds: Normal heart sounds. No murmur heard. Pulmonary:     Breath sounds: Normal breath sounds. No wheezing or rales.  Musculoskeletal:     Cervical back: Neck supple. No tenderness.     Right lower leg: No edema.     Left lower leg: No edema.  Skin:    General: Skin is warm.     Findings: No rash.     Comments: Clubbing of fingernails  Neurological:     General: No focal deficit present.     Mental Status: He is alert and oriented to person, place, and time.     Cranial Nerves: No cranial nerve deficit.     Sensory: No sensory deficit.     Motor: No weakness.  Psychiatric:        Mood and Affect: Mood normal.        Behavior: Behavior normal.     BP 132/70 (BP Location: Left Arm) Comment: At home  Pulse 94   Ht 5' 7 (1.702 m)   Wt 132 lb (59.9 kg)   SpO2 100%   BMI 20.67 kg/m  Wt Readings from Last 3 Encounters:  08/01/24 132 lb (59.9 kg)  07/14/24 130 lb 6.4 oz (59.1 kg)  05/25/24 128 lb (58.1 kg)    Lab Results  Component Value Date   TSH 0.450 02/29/2024   Lab Results  Component Value Date   WBC 6.2 02/29/2024   HGB 15.6 02/29/2024   HCT 47.0 02/29/2024   MCV 96 02/29/2024   PLT 218 02/29/2024   Lab Results  Component Value Date   NA 140 02/29/2024   K 4.8 02/29/2024   CO2 23 02/29/2024   GLUCOSE 134 (H) 02/29/2024   BUN 9 02/29/2024   CREATININE 1.16 02/29/2024   BILITOT 0.5 02/29/2024   ALKPHOS 129 (H) 02/29/2024   AST 14 02/29/2024   ALT 9 02/29/2024   PROT 7.8 02/29/2024   ALBUMIN 4.1 02/29/2024   CALCIUM  9.9 02/29/2024   ANIONGAP 11 07/29/2023   EGFR 67 02/29/2024   Lab Results  Component Value Date   CHOL 87 (L)  08/23/2023   Lab Results  Component Value Date   HDL 40 08/23/2023   Lab Results  Component Value Date   LDLCALC 36 08/23/2023   Lab Results  Component Value Date   TRIG 36 08/23/2023   Lab Results  Component Value Date   CHOLHDL 2.2 08/23/2023   Lab Results  Component Value Date   HGBA1C 6.3 02/28/2024      Assessment & Plan:   Problem List Items Addressed This Visit       Cardiovascular and Mediastinum   Essential hypertension - Primary   BP Readings from Last 1 Encounters:  08/01/24 132/70   Elevated today as he has not taken his medications today Well-controlled with amlodipine  10 mg QD, lisinopril  40 mg QD and clonidine  0.1 mg BID at home Followed by cardiology Counseled for compliance  with the medications Advised DASH diet and moderate exercise/walking, at least 150 mins/week      Relevant Orders   TSH + free T4   PAD (peripheral artery disease)   S/p right and left iliac stent placement Followed by vascular surgery and cardiology On aspirin , Plavix  and statin        Respiratory   Centrilobular emphysema (HCC)   Overall better controlled, has mild dyspnea on exertion Has Albuterol  PRN for dyspnea or wheezing Has Stiolto as maintenance inhaler, was evaluated by pulmonology- Dr. Catherine        Endocrine   Type 2 diabetes mellitus with other specified complication [facility])   Lab Results  Component Value Date   HGBA1C 6.3 02/28/2024   Associated with HTN and PAD Well-controlled with Lantus  12 units every morning and Glipizide  2.5 mg QD, followed by Dr. Lenis Advised to follow diabetic diet On statin and ACEi F/u CMP and lipid panel Diabetic eye exam: Advised to follow up with Ophthalmology for diabetic eye exam      Relevant Orders   CMP14+EGFR     Genitourinary   BPH (benign prostatic hyperplasia)   CT abdomen in 2024 showed ring enhancing prostatic mass Had prostate biopsy on 07/29/23 - was bengin Followed by urology Check PSA       Relevant Orders   PSA     Other   Insomnia (Chronic)   Better with Remeron  15 mg at bedtime Sleep hygiene material provided Has tried trazodone  100 mg nightly, still had difficulty maintaining sleep      Mixed hyperlipidemia   On Lipitor 10 mg QD Check lipid profile      Relevant Orders   Lipid Profile   Other Visit Diagnoses       Encounter for immunization       Relevant Orders   Flu vaccine HIGH DOSE PF(Fluzone Trivalent) (Completed)         No orders of the defined types were placed in this encounter.   Follow-up: Return in about 6 months (around 01/29/2025) for HTN.    Suzzane MARLA Blanch, MD

## 2024-08-02 ENCOUNTER — Encounter: Payer: Self-pay | Admitting: Adult Health

## 2024-08-02 ENCOUNTER — Ambulatory Visit: Admitting: Adult Health

## 2024-08-02 DIAGNOSIS — F17210 Nicotine dependence, cigarettes, uncomplicated: Secondary | ICD-10-CM | POA: Diagnosis not present

## 2024-08-02 NOTE — Progress Notes (Signed)
  Virtual Visit via Telephone Note  I connected with Chase Briggs , 08/02/24 9:56 AM by a telemedicine application and verified that I am speaking with the correct person using two identifiers.  Location: Patient: home Provider: home   I discussed the limitations of evaluation and management by telemedicine and the availability of in person appointments. The patient expressed understanding and agreed to proceed.   Shared Decision Making Visit Lung Cancer Screening Program [phone])   Eligibility: 73 y.o. Pack Years Smoking History Calculation = 40 pack years  (# packs/per year x # years smoked) Recent History of coughing up blood  no Unexplained weight loss? no ( >Than 15 pounds within the last 6 months ) Prior History Lung / other cancer no (Diagnosis within the last 5 years already requiring surveillance chest CT Scans). Smoking Status Current Smoker  Visit Components: Discussion included one or more decision making aids. YES Discussion included risk/benefits of screening. YES Discussion included potential follow up diagnostic testing for abnormal scans. YES Discussion included meaning and risk of over diagnosis. YES Discussion included meaning and risk of False Positives. YES Discussion included meaning of total radiation exposure. YES  Counseling Included: Importance of adherence to annual lung cancer LDCT screening. YES Impact of comorbidities on ability to participate in the program. YES Ability and willingness to under diagnostic treatment. YES  Smoking Cessation Counseling: Current Smokers:  Discussed importance of smoking cessation. yes Information about tobacco cessation classes and interventions provided to patient. yes Patient provided with ticket for LDCT Scan. yes Symptomatic Patient. NO Diagnosis Code: Tobacco Use Z72.0 Asymptomatic Patient yes  Counseling - 4 minutes of smoking cessation counseling (CT Chest Lung Cancer Screening Low Dose W/O  CM) [id number]  Smoking/Tobacco Cessation Counseling Chase Briggs is a current user of tobacco or nicotine  products. He is not ready to quit at this time. Counseling provided today addressed the risks of continued use and the benefits of cessation. Discussed tobacco/nicotine  use history, readiness to quit, and evidence-based treatment options including behavioral strategies, support resources, and pharmacologic therapies. Provided encouragement and educational materials on steps and resources to quit smoking. Patient questions were addressed, and follow-up recommended for continued support. Total time spent on counseling: 3 minutes.    Z12.2-Screening of respiratory organs Z87.891-Personal history of nicotine  dependence   Chase Briggs 08/02/24

## 2024-08-02 NOTE — Patient Instructions (Signed)

## 2024-08-03 ENCOUNTER — Ambulatory Visit (HOSPITAL_COMMUNITY)
Admission: RE | Admit: 2024-08-03 | Discharge: 2024-08-03 | Disposition: A | Discharge status: Home / Self Care | Source: Ambulatory Visit | Attending: Acute Care | Admitting: Acute Care

## 2024-08-03 DIAGNOSIS — F17210 Nicotine dependence, cigarettes, uncomplicated: Secondary | ICD-10-CM | POA: Insufficient documentation

## 2024-08-03 DIAGNOSIS — Z122 Encounter for screening for malignant neoplasm of respiratory organs: Secondary | ICD-10-CM | POA: Insufficient documentation

## 2024-08-03 DIAGNOSIS — Z87891 Personal history of nicotine dependence: Secondary | ICD-10-CM | POA: Insufficient documentation

## 2024-08-08 ENCOUNTER — Other Ambulatory Visit: Payer: Self-pay | Admitting: Internal Medicine

## 2024-08-08 DIAGNOSIS — J432 Centrilobular emphysema: Secondary | ICD-10-CM

## 2024-08-13 ENCOUNTER — Other Ambulatory Visit: Payer: Self-pay | Admitting: Internal Medicine

## 2024-08-13 DIAGNOSIS — I739 Peripheral vascular disease, unspecified: Secondary | ICD-10-CM

## 2024-08-14 ENCOUNTER — Other Ambulatory Visit: Payer: Self-pay | Admitting: Acute Care

## 2024-08-14 DIAGNOSIS — Z87891 Personal history of nicotine dependence: Secondary | ICD-10-CM

## 2024-08-14 DIAGNOSIS — F17210 Nicotine dependence, cigarettes, uncomplicated: Secondary | ICD-10-CM

## 2024-08-14 DIAGNOSIS — Z122 Encounter for screening for malignant neoplasm of respiratory organs: Secondary | ICD-10-CM

## 2024-08-16 ENCOUNTER — Other Ambulatory Visit: Payer: Self-pay | Admitting: Internal Medicine

## 2024-08-16 DIAGNOSIS — I739 Peripheral vascular disease, unspecified: Secondary | ICD-10-CM

## 2024-08-21 ENCOUNTER — Other Ambulatory Visit

## 2024-08-21 DIAGNOSIS — E1169 Type 2 diabetes mellitus with other specified complication: Secondary | ICD-10-CM | POA: Diagnosis not present

## 2024-08-21 DIAGNOSIS — N400 Enlarged prostate without lower urinary tract symptoms: Secondary | ICD-10-CM | POA: Diagnosis not present

## 2024-08-21 DIAGNOSIS — E782 Mixed hyperlipidemia: Secondary | ICD-10-CM | POA: Diagnosis not present

## 2024-08-21 DIAGNOSIS — Z794 Long term (current) use of insulin: Secondary | ICD-10-CM | POA: Diagnosis not present

## 2024-08-21 DIAGNOSIS — I10 Essential (primary) hypertension: Secondary | ICD-10-CM | POA: Diagnosis not present

## 2024-08-21 LAB — HEPATIC FUNCTION PANEL
ALT: 14 U/L (ref 10–40)
AST: 18 (ref 14–40)
Alkaline Phosphatase: 127 — AB (ref 25–125)
Bilirubin, Total: 0.4

## 2024-08-21 LAB — TSH: TSH: 0.75 (ref 0.41–5.90)

## 2024-08-21 LAB — BASIC METABOLIC PANEL WITH GFR
BUN: 9 (ref 4–21)
CO2: 25 — AB (ref 13–22)
Chloride: 102 (ref 99–108)
Creatinine: 1.2 (ref 0.6–1.3)
Glucose: 122
Potassium: 4.6 meq/L (ref 3.5–5.1)
Sodium: 142 (ref 137–147)

## 2024-08-21 LAB — COMPREHENSIVE METABOLIC PANEL WITH GFR
Albumin: 4.3 (ref 3.5–5.0)
Calcium: 9.9 (ref 8.7–10.7)
Globulin: 3.4

## 2024-08-21 LAB — LIPID PANEL
Cholesterol: 103 (ref 0–200)
HDL: 50 (ref 35–70)
LDL Cholesterol: 43
Triglycerides: 31 — AB (ref 40–160)

## 2024-08-21 LAB — PSA: PSA: 1.3

## 2024-08-22 ENCOUNTER — Ambulatory Visit: Payer: Self-pay | Admitting: Internal Medicine

## 2024-08-22 LAB — LIPID PANEL
Chol/HDL Ratio: 2.1 ratio (ref 0.0–5.0)
Cholesterol, Total: 103 mg/dL (ref 100–199)
HDL: 50 mg/dL (ref >39)
LDL Chol Calc (NIH): 43 mg/dL (ref 0–99)
Triglycerides: 31 mg/dL (ref 0–149)
VLDL Cholesterol Cal: 10 mg/dL (ref 5–40)

## 2024-08-22 LAB — CMP14+EGFR
ALT: 14 IU/L (ref 0–44)
AST: 18 IU/L (ref 0–40)
Albumin: 4.3 g/dL (ref 3.8–4.8)
Alkaline Phosphatase: 127 IU/L — ABNORMAL HIGH (ref 47–123)
BUN/Creatinine Ratio: 8 — ABNORMAL LOW (ref 10–24)
BUN: 9 mg/dL (ref 8–27)
Bilirubin Total: 0.4 mg/dL (ref 0.0–1.2)
CO2: 25 mmol/L (ref 20–29)
Calcium: 9.9 mg/dL (ref 8.6–10.2)
Chloride: 102 mmol/L (ref 96–106)
Creatinine, Ser: 1.2 mg/dL (ref 0.76–1.27)
Globulin, Total: 3.4 g/dL (ref 1.5–4.5)
Glucose: 122 mg/dL — ABNORMAL HIGH (ref 70–99)
Potassium: 4.6 mmol/L (ref 3.5–5.2)
Sodium: 142 mmol/L (ref 134–144)
Total Protein: 7.7 g/dL (ref 6.0–8.5)
eGFR: 64 mL/min/1.73 (ref >59)

## 2024-08-22 LAB — TSH+FREE T4
Free T4: 1.32 ng/dL (ref 0.82–1.77)
TSH: 0.745 u[IU]/mL (ref 0.450–4.500)

## 2024-08-22 LAB — PSA: Prostate Specific Ag, Serum: 1.3 ng/mL (ref 0.0–4.0)

## 2024-08-25 ENCOUNTER — Ambulatory Visit: Admitting: Urology

## 2024-08-25 VITALS — BP 189/77 | HR 101

## 2024-08-25 DIAGNOSIS — N401 Enlarged prostate with lower urinary tract symptoms: Secondary | ICD-10-CM

## 2024-08-25 DIAGNOSIS — N138 Other obstructive and reflux uropathy: Secondary | ICD-10-CM

## 2024-08-25 DIAGNOSIS — R351 Nocturia: Secondary | ICD-10-CM | POA: Diagnosis not present

## 2024-08-25 DIAGNOSIS — R972 Elevated prostate specific antigen [PSA]: Secondary | ICD-10-CM

## 2024-08-25 LAB — MICROSCOPIC EXAMINATION

## 2024-08-25 LAB — URINALYSIS, ROUTINE W REFLEX MICROSCOPIC
Bilirubin, UA: NEGATIVE
Glucose, UA: NEGATIVE
Ketones, UA: NEGATIVE
Nitrite, UA: POSITIVE — AB
Specific Gravity, UA: 1.01 (ref 1.005–1.030)
Urobilinogen, Ur: 0.2 mg/dL (ref 0.2–1.0)
pH, UA: 6.5 (ref 5.0–7.5)

## 2024-08-25 NOTE — Progress Notes (Unsigned)
 08/25/2024 8:53 AM   Chase Briggs 04/26/1951 [id number]  Referring provider: Tobie Suzzane POUR, MD [address] [place],  [place] [address]  No chief complaint on file.   HPI: PSA 1.3. IPSS 7 QOl 2 on no BPH therapy. Nocturia 1-2x depending on fluid consumption   PMH: Past Medical History:  Diagnosis Date   Diabetes mellitus    Fatty liver    GERD (gastroesophageal reflux disease)    HTN (hypertension)    Hyperlipidemia    Insomnia    Renal insufficiency     Surgical History\\ Home Medications:  Allergies as of 08/25/2024   No Known Allergies      Medication List        Accurate as of August 25, 2024  8:53 AM. If you have any questions, ask your nurse or doctor.          albuterol  108 (90 Base) MCG/ACT inhaler Commonly known as: VENTOLIN  HFA INHALE 2 PUFFS INTO THE LUNGS EVERY 6 HOURS AS NEEDED FOR WHEEZING OR SHORTNESS OF BREATH   amLODipine  10 MG tablet Commonly known as: NORVASC  TAKE 1 TABLET(10 MG) BY MOUTH DAILY   aspirin  EC 81 MG tablet Take 1 tablet (81 mg total) by mouth daily with breakfast.   atorvastatin  10 MG tablet Commonly known as: LIPITOR TAKE 1 TABLET(10 MG) BY MOUTH DAILY   cloNIDine  0.1 MG tablet Commonly known as: CATAPRES  TAKE 1 TABLET(0.1 MG) BY MOUTH TWICE DAILY   clopidogrel  75 MG tablet Commonly known as: PLAVIX  TAKE 1 TABLET BY MOUTH DAILY   EPINEPHrine  0.3 mg/0.3 mL Soaj injection Commonly known as: EPI-PEN Inject 0.3 mg into the muscle as needed for anaphylaxis.   famotidine  20 MG tablet Commonly known as: Pepcid  Take 1 tablet (20 mg total) by mouth 2 (two) times daily.   ferrous sulfate  325 (65 FE) MG EC tablet Take 325 mg by mouth daily with breakfast.   glipiZIDE  2.5 MG 24 hr tablet Commonly known as: GLUCOTROL  XL TAKE 1 TABLET(2.5 MG) BY MOUTH DAILY WITH BREAKFAST   glucose blood test strip 1 each by Other route 2 (two) times daily. Use as instructed bid E11.65 [facility]   Lancets  Misc 1 each by Does not apply route 2 (two) times daily. E11.65 [facility] Premier   Lantus  SoloStar 100 UNIT/ML Solostar Pen Generic drug: insulin  glargine Inject 12 Units into the skin at bedtime.   lisinopril  40 MG tablet Commonly known as: ZESTRIL  TAKE 1 TABLET(40 MG) BY MOUTH DAILY   mirtazapine  15 MG tablet Commonly known as: REMERON  TAKE 1 TABLET(15 MG) BY MOUTH AT BEDTIME   mupirocin  ointment 2 % Commonly known as: BACTROBAN  Apply 1 Application topically 2 (two) times daily. Over upper lip area.   nicotine  21 mg/24hr patch Commonly known as: NICODERM CQ  - dosed in mg/24 hours RX #1 Weeks 1-4: 21 mg x 1 patch daily. Wear for 24 hours. If you have sleep disturbances, remove at bedtime.   nicotine  14 mg/24hr patch Commonly known as: NICODERM CQ  - dosed in mg/24 hours RX #2 Weeks 5-6: 14 mg x 1 patch daily. Wear for 24 hours. If you have sleep disturbances, remove at bedtime.   nicotine  7 mg/24hr patch Commonly known as: NICODERM CQ  - dosed in mg/24 hr RX #3 Weeks 7-8: 7 mg x 1 patch daily. Wear for 24 hours. If you have sleep disturbances, remove at bedtime.   nicotine  polacrilex 4 MG gum Commonly known as: NICORETTE  RX #1 Weeks 1-6: 1  piece every 1-2 hours.Use at least 9 pieces of gum per day for the first 6 weeks. Max 24 pieces per day.   Stiolto Respimat  2.5-2.5 MCG/ACT Aers Generic drug: Tiotropium Bromide-Olodaterol Inhale 2 puffs into the lungs daily.   Stiolto Respimat  2.5-2.5 MCG/ACT Aers Generic drug: Tiotropium Bromide-Olodaterol Inhale 2 puffs into the lungs daily.   Sure Comfort Pen Needles 32G X 4 MM Misc Generic drug: Insulin  Pen Needle USE ONCE DAILY   tiZANidine  4 MG tablet Commonly known as: ZANAFLEX  TAKE 1 TABLET(4 MG) BY MOUTH TWICE DAILY AS NEEDED FOR MUSCLE SPASMS   Vitamin D  125 MCG (5000 UT) Caps Take 2,000 Units by mouth daily.        Allergies: Allergies[1]  Family History: Family History  Problem Relation Age of Onset    Diabetes Mother    Diabetes Father    Diabetes Brother    Colon cancer Neg Hx    Liver disease Neg Hx     Social History:  reports that he has been smoking cigarettes. He started smoking about 54 years ago. He has a 27.5 pack-year smoking history. He has never used smokeless tobacco. He reports that he does not drink alcohol and does not use drugs.  ROS: All other review of systems were reviewed and are negative except what is noted above in HPI  Physical Exam: BP (!) 189/77   Pulse (!) 101   Constitutional:  Alert and oriented, No acute distress. HEENT: [place] AT, moist mucus membranes.  Trachea midline, no masses. Cardiovascular: No clubbing, cyanosis, or edema. Respiratory: Normal respiratory effort, no increased work of breathing. GI: Abdomen is soft, nontender, nondistended, no abdominal masses GU: No CVA tenderness.  Lymph: No cervical or inguinal lymphadenopathy. Skin: No rashes, bruises or suspicious lesions. Neurologic: Grossly intact, no focal deficits, moving all 4 extremities. Psychiatric: Normal mood and affect.  Laboratory Data: Lab Results  Component Value Date   WBC 6.2 02/29/2024   HGB 15.6 02/29/2024   HCT 47.0 02/29/2024   MCV 96 02/29/2024   PLT 218 02/29/2024    Lab Results  Component Value Date   CREATININE 1.20 08/21/2024    No results found for: PSA  No results found for: TESTOSTERONE  Lab Results  Component Value Date   HGBA1C 6.3 02/28/2024    Urinalysis    Component Value Date/Time   COLORURINE STRAW (A) 06/09/2023 1030   APPEARANCEUR Clear 02/02/2024 0904   LABSPEC 1.014 06/09/2023 1030   PHURINE 7.0 06/09/2023 1030   GLUCOSEU Negative 02/02/2024 0904   HGBUR NEGATIVE 06/09/2023 1030   BILIRUBINUR Negative 02/02/2024 0904   KETONESUR NEGATIVE 06/09/2023 1030   PROTEINUR Trace 02/02/2024 0904   PROTEINUR NEGATIVE 06/09/2023 1030   UROBILINOGEN 1.0 03/06/2022 0829   UROBILINOGEN 0.2 02/19/2014 1340   NITRITE Negative  02/02/2024 0904   NITRITE NEGATIVE 06/09/2023 1030   LEUKOCYTESUR Trace (A) 02/02/2024 0904   LEUKOCYTESUR NEGATIVE 06/09/2023 1030    Lab Results  Component Value Date   LABMICR See below: 02/02/2024   WBCUA 0-5 02/02/2024   LABEPIT 0-10 02/02/2024   MUCUS Present 09/23/2020   BACTERIA Many (A) 02/02/2024    Pertinent Imaging: *** No results found for this or any previous visit.  Results for orders placed during the hospital encounter of 06/09/23  US  Venous Img Lower Bilateral (DVT)  Narrative CLINICAL DATA:  Shortness of breath. Bilateral lower extremity pain. Evaluate for DVT.  EXAM: BILATERAL LOWER EXTREMITY VENOUS DOPPLER ULTRASOUND  TECHNIQUE: Gray-scale sonography with graded  compression, as well as color Doppler and duplex ultrasound were performed to evaluate the lower extremity deep venous systems from the level of the common femoral vein and including the common femoral, femoral, profunda femoral, popliteal and calf veins including the posterior tibial, peroneal and gastrocnemius veins when visible. The superficial great saphenous vein was also interrogated. Spectral Doppler was utilized to evaluate flow at rest and with distal augmentation maneuvers in the common femoral, femoral and popliteal veins.  COMPARISON:  Bilateral lower extremity venous Doppler ultrasound-10/15/2022 (negative)  FINDINGS: RIGHT LOWER EXTREMITY  Common Femoral Vein: No evidence of thrombus. Normal compressibility, respiratory phasicity and response to augmentation.  Saphenofemoral Junction: No evidence of thrombus. Normal compressibility and flow on color Doppler imaging.  Profunda Femoral Vein: No evidence of thrombus. Normal compressibility and flow on color Doppler imaging.  Femoral Vein: No evidence of thrombus. Normal compressibility, respiratory phasicity and response to augmentation.  Popliteal Vein: No evidence of thrombus. Normal compressibility, respiratory  phasicity and response to augmentation.  Calf Veins: No evidence of thrombus. Normal compressibility and flow on color Doppler imaging.  Superficial Great Saphenous Vein: No evidence of thrombus. Normal compressibility.  Other Findings:  None.  LEFT LOWER EXTREMITY  Common Femoral Vein: No evidence of thrombus. Normal compressibility, respiratory phasicity and response to augmentation.  Saphenofemoral Junction: No evidence of thrombus. Normal compressibility and flow on color Doppler imaging.  Profunda Femoral Vein: No evidence of thrombus. Normal compressibility and flow on color Doppler imaging.  Femoral Vein: No evidence of thrombus. Normal compressibility, respiratory phasicity and response to augmentation.  Popliteal Vein: No evidence of thrombus. Normal compressibility, respiratory phasicity and response to augmentation.  Calf Veins: No evidence of thrombus. Normal compressibility and flow on color Doppler imaging.  Superficial Great Saphenous Vein: No evidence of thrombus. Normal compressibility.  Other Findings:  None.  IMPRESSION: No evidence of DVT within either lower extremity.   Electronically Signed By: Norleen Roulette M.D. On: 06/11/2023 16:27  No results found for this or any previous visit.  No results found for this or any previous visit.  No results found for this or any previous visit.  No results found for this or any previous visit.  No results found for this or any previous visit.  Results for orders placed during the hospital encounter of 01/28/21  CT Renal Stone Study  Narrative CLINICAL DATA:  Left-sided flank pain and dysuria  EXAM: CT ABDOMEN AND PELVIS WITHOUT CONTRAST  TECHNIQUE: Multidetector CT imaging of the abdomen and pelvis was performed following the standard protocol without IV contrast.  COMPARISON:  10/30/2017  FINDINGS: Lower chest: Emphysematous changes are noted. No focal infiltrate or effusion is  seen.  Hepatobiliary: Gallbladder is within normal limits. Area of focal fatty infiltration is noted along the falciform ligament. No other focal abnormality is noted.  Pancreas: Unremarkable. No pancreatic ductal dilatation or surrounding inflammatory changes.  Spleen: Normal in size without focal abnormality.  Adrenals/Urinary Tract: Adrenal glands are within normal limits. Right kidney is well visualized without renal calculi or obstructive changes. The left kidney demonstrates some increased perinephric stranding. A few tiny left renal stones are noted. No stones are identified. The bladder is incompletely distended.  Stomach/Bowel: Scattered diverticular change of the colon is noted. The appendix is well visualized and within normal limits. Small bowel and stomach are unremarkable.  Vascular/Lymphatic: Aortic atherosclerosis. No enlarged abdominal or pelvic lymph nodes.  Reproductive: Prostate is unremarkable.  Other: No abdominal wall hernia or abnormality. No abdominopelvic ascites.  Musculoskeletal: Degenerative changes of lumbar spine are noted.  IMPRESSION: A few tiny nonobstructing left renal calculi.  Mild diverticular change without diverticulitis.  Mild fatty infiltration of the liver along the falciform ligament.  Aortic Atherosclerosis (ICD10-I70.0) and Emphysema (ICD10-J43.9).   Electronically Signed By: Oneil Devonshire M.D. On: 01/28/2021 15:18   Assessment & Plan:    1. Elevated PSA (Primary) Followup 1 year with PSA - Urinalysis, Routine w reflex microscopic  2. Benign prostatic hyperplasia with urinary obstruction ***  3. Nocturia ***   No follow-ups on file.  Belvie Clara, MD  [facility] [place]      [1] No Known Allergies

## 2024-08-29 ENCOUNTER — Ambulatory Visit: Admitting: "Endocrinology

## 2024-08-29 ENCOUNTER — Encounter: Payer: Self-pay | Admitting: "Endocrinology

## 2024-08-29 VITALS — BP 132/84 | HR 44 | Resp 18 | Ht 67.0 in | Wt 132.0 lb

## 2024-08-29 DIAGNOSIS — I10 Essential (primary) hypertension: Secondary | ICD-10-CM | POA: Diagnosis not present

## 2024-08-29 DIAGNOSIS — E1169 Type 2 diabetes mellitus with other specified complication: Secondary | ICD-10-CM

## 2024-08-29 DIAGNOSIS — Z794 Long term (current) use of insulin: Secondary | ICD-10-CM | POA: Diagnosis not present

## 2024-08-29 DIAGNOSIS — E782 Mixed hyperlipidemia: Secondary | ICD-10-CM

## 2024-08-29 LAB — URINE CULTURE

## 2024-08-29 LAB — POCT GLYCOSYLATED HEMOGLOBIN (HGB A1C): Hemoglobin A1C: 6.3 % — AB (ref 4.0–5.6)

## 2024-08-29 NOTE — Progress Notes (Signed)
 08/29/2024   Endocrinology follow-up note   Subjective:    Patient ID: Chase Briggs, male    DOB: September 22, 1950, PCP Tobie Suzzane POUR, MD   Past Medical History:  Diagnosis Date   Diabetes mellitus    Fatty liver    GERD (gastroesophageal reflux disease)    HTN (hypertension)    Hyperlipidemia    Insomnia    Renal insufficiency    Past Surgical History:  Procedure Laterality Date   ABDOMINAL AORTOGRAM W/LOWER EXTREMITY N/A 08/22/2018   Procedure: ABDOMINAL AORTOGRAM W/LOWER EXTREMITY;  Surgeon: Sheree Penne Bruckner, MD;  Location: [facility] INVASIVE CV LAB;  Service: Cardiovascular;  Laterality: N/A;   BACK SURGERY     CARPAL TUNNEL WITH CUBITAL TUNNEL Right 03/21/2019   Procedure: RIGHT CARPAL TUNNEL AND CUBITAL  TUNNEL RELEASES;  Surgeon: Murrell Kuba, MD;  Location: [facility];  Service: Orthopedics;  Laterality: Right;  AXILLARY BLOCK   COLONOSCOPY  01/2007   Dr. Mavis, reviewed report, mild diverticulosis. Prep adequate. Next TCS due 01/2017   ESOPHAGOGASTRODUODENOSCOPY N/A 10/21/2012   DOQ:[id number] GASTRITIS   MASS EXCISION Right 10/26/2012   Procedure: EXCISION MASS;  Surgeon: Oneil DELENA Mavis, MD;  Location: [facility];  Service: General;  Laterality: Right;   PERIPHERAL VASCULAR INTERVENTION  08/22/2018   Procedure: PERIPHERAL VASCULAR INTERVENTION;  Surgeon: Sheree Penne Bruckner, MD;  Location: [facility] INVASIVE CV LAB;  Service: Cardiovascular;;  bilateral external iliac stents   scalp mass  4-5 yrs ago   Dr Mavis COWBOY NERVE TRANSPOSITION Right 03/21/2019   Procedure: DECOMPRESSION RIGHT ULNAR NERVE;  Surgeon: Murrell Kuba, MD;  Location: [facility];  Service: Orthopedics;  Laterality: Right;   Social History   Socioeconomic History   Marital status: Married    Spouse name: Not on file   Number of children: 2   Years of education: Not on file   Highest education level: Not on file  Occupational History   Occupation: reitred     Comment: [employer], heavy lifting  Tobacco Use   Smoking status: Every Day    Current packs/day: 0.50    Average packs/day: 0.5 packs/day for 55.0 years (27.5 ttl pk-yrs)    Types: Cigarettes    Start date: 38   Smokeless tobacco: Never  Vaping Use   Vaping status: Never Used  Substance and Sexual Activity   Alcohol use: No    Comment: hx heavy etoh x 3 yrs, quit 30+ yrs ago   Drug use: No   Sexual activity: Yes    Birth control/protection: None  Other Topics Concern   Not on file  Social History Narrative   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 Highest level of education: 12th grade. Married for 12 years. Has 2 sons (33 and 21). Lives with wife.   Social Drivers of Health   Tobacco Use: High Risk (08/29/2024)   Patient History    Smoking Tobacco Use: Every Day    Smokeless Tobacco Use: Never    Passive Exposure: Not on file  Financial Resource Strain: Low Risk (05/25/2024)   Overall Financial Resource Strain (CARDIA)    Difficulty of Paying Living Expenses: Not hard at all  Food Insecurity: No Food Insecurity (05/25/2024)   Epic    Worried About Programme Researcher, Broadcasting/film/video in the Last Year: Never true    Ran Out of Food in the Last Year: Never true  Transportation Needs: No Transportation Needs (05/25/2024)   Epic    Lack  of Transportation (Medical): No    Lack of Transportation (Non-Medical): No  Physical Activity: Sufficiently Active (05/25/2024)   Exercise Vital Sign    Days of Exercise per Week: 7 days    Minutes of Exercise per Session: 30 min  Stress: No Stress Concern Present (05/25/2024)   [employer] of Occupational Health - Occupational Stress Questionnaire    Feeling of Stress: Not at all  Social Connections: Socially Integrated (05/25/2024)   Social Connection and Isolation Panel    Frequency of Communication with Friends and Family: More than three times a week    Frequency of Social Gatherings with Friends and Family: More than three times a week    Attends  Religious Services: More than 4 times per year    Active Member of Clubs or Organizations: Yes    Attends Banker Meetings: More than 4 times per year    Marital Status: Married  Depression (PHQ2-9): Low Risk (08/01/2024)   Depression (PHQ2-9)    PHQ-2 Score: 0  Alcohol Screen: Low Risk (05/25/2024)   Alcohol Screen    Last Alcohol Screening Score (AUDIT): 0  Housing: Low Risk (05/25/2024)   Epic    Unable to Pay for Housing in the Last Year: No    Number of Times Moved in the Last Year: 0    Homeless in the Last Year: No  Utilities: Not At Risk (05/25/2024)   Epic    Threatened with loss of utilities: No  Health Literacy: Adequate Health Literacy (05/25/2024)   B1300 Health Literacy    Frequency of need for help with medical instructions: Never   Outpatient Encounter Medications as of 08/29/2024  Medication Sig   albuterol  (VENTOLIN  HFA) 108 (90 Base) MCG/ACT inhaler INHALE 2 PUFFS INTO THE LUNGS EVERY 6 HOURS AS NEEDED FOR WHEEZING OR SHORTNESS OF BREATH   amLODipine  (NORVASC ) 10 MG tablet TAKE 1 TABLET(10 MG) BY MOUTH DAILY   aspirin  EC 81 MG tablet Take 1 tablet (81 mg total) by mouth daily with breakfast.   atorvastatin  (LIPITOR) 10 MG tablet TAKE 1 TABLET(10 MG) BY MOUTH DAILY   Cholecalciferol (VITAMIN D ) 125 MCG (5000 UT) CAPS Take 2,000 Units by mouth daily.   cloNIDine  (CATAPRES ) 0.1 MG tablet TAKE 1 TABLET(0.1 MG) BY MOUTH TWICE DAILY   clopidogrel  (PLAVIX ) 75 MG tablet TAKE 1 TABLET BY MOUTH DAILY   EPINEPHrine  0.3 mg/0.3 mL IJ SOAJ injection Inject 0.3 mg into the muscle as needed for anaphylaxis.   famotidine  (PEPCID ) 20 MG tablet Take 1 tablet (20 mg total) by mouth 2 (two) times daily.   ferrous sulfate  325 (65 FE) MG EC tablet Take 325 mg by mouth daily with breakfast.   glipiZIDE  (GLUCOTROL  XL) 2.5 MG 24 hr tablet TAKE 1 TABLET(2.5 MG) BY MOUTH DAILY WITH BREAKFAST   glucose blood test strip 1 each by Other route 2 (two) times daily. Use as instructed bid  E11.65 Relion Premier   insulin  glargine (LANTUS  SOLOSTAR) 100 UNIT/ML Solostar Pen Inject 12 Units into the skin at bedtime.   Lancets MISC 1 each by Does not apply route 2 (two) times daily. E11.65 Relion Premier   lisinopril  (ZESTRIL ) 40 MG tablet TAKE 1 TABLET(40 MG) BY MOUTH DAILY   mirtazapine  (REMERON ) 15 MG tablet TAKE 1 TABLET(15 MG) BY MOUTH AT BEDTIME   mupirocin  ointment (BACTROBAN ) 2 % Apply 1 Application topically 2 (two) times daily. Over upper lip area.   nicotine  (NICODERM CQ  - DOSED IN MG/24 HOURS) 14 mg/24hr  patch RX #2 Weeks 5-6: 14 mg x 1 patch daily. Wear for 24 hours. If you have sleep disturbances, remove at bedtime.   nicotine  (NICODERM CQ  - DOSED IN MG/24 HOURS) 21 mg/24hr patch RX #1 Weeks 1-4: 21 mg x 1 patch daily. Wear for 24 hours. If you have sleep disturbances, remove at bedtime.   nicotine  (NICODERM CQ  - DOSED IN MG/24 HR) 7 mg/24hr patch RX #3 Weeks 7-8: 7 mg x 1 patch daily. Wear for 24 hours. If you have sleep disturbances, remove at bedtime.   nicotine  polacrilex (NICORETTE ) 4 MG gum RX #1 Weeks 1-6: 1 piece every 1-2 hours.Use at least 9 pieces of gum per day for the first 6 weeks. Max 24 pieces per day.   SURE COMFORT PEN NEEDLES 32G X 4 MM MISC USE ONCE DAILY   Tiotropium Bromide-Olodaterol (STIOLTO RESPIMAT ) 2.5-2.5 MCG/ACT AERS Inhale 2 puffs into the lungs daily.   Tiotropium Bromide-Olodaterol (STIOLTO RESPIMAT ) 2.5-2.5 MCG/ACT AERS Inhale 2 puffs into the lungs daily.   tiZANidine  (ZANAFLEX ) 4 MG tablet TAKE 1 TABLET(4 MG) BY MOUTH TWICE DAILY AS NEEDED FOR MUSCLE SPASMS   No facility-administered encounter medications on file as of 08/29/2024.   ALLERGIES: No Known Allergies VACCINATION STATUS: Immunization History  Administered Date(s) Administered   Fluad Quad(high Dose 65+) 08/27/2021, 07/24/2022   Fluad Trivalent(High Dose 65+) 06/08/2023   INFLUENZA, HIGH DOSE SEASONAL PF 08/01/2024   Moderna Sars-Covid-2 Vaccination 01/04/2020, 02/06/2020    PNEUMOCOCCAL CONJUGATE-20 07/24/2022    Diabetes He presents for his follow-up diabetic visit. He has type 2 diabetes mellitus. Onset time: He was diagnosed at approximate age of 35 years. His disease course has been stable. There are no hypoglycemic associated symptoms. Pertinent negatives for hypoglycemia include no confusion, headaches, pallor or seizures. Pertinent negatives for diabetes include no chest pain, no fatigue, no polydipsia, no polyphagia, no polyuria and no weakness. There are no hypoglycemic complications. Symptoms are stable (He recently lost control of his glycemia due to exposure to heavy dose steroids.). Risk factors for coronary artery disease include diabetes mellitus, dyslipidemia, hypertension, male sex, sedentary lifestyle and tobacco exposure. He is compliant with treatment most of the time. His weight is stable. He is following a generally unhealthy diet. When asked about meal planning, he reported none. He has not had a previous visit with a dietitian. His home blood glucose trend is fluctuating minimally. His breakfast blood glucose range is generally 90-110 mg/dl. His bedtime blood glucose range is generally 110-130 mg/dl. His overall blood glucose range is 110-130 mg/dl. Chase Briggs presents with controlled glycemic profile without hypoglycemia.  His point-of-care A1c is stable at 6.3%.     ) An ACE inhibitor/angiotensin II receptor blocker is being taken. Eye exam is current.  Hypertension This is a chronic problem. The current episode started more than 1 year ago. The problem has been gradually improving since onset. The problem is uncontrolled. Pertinent negatives include no chest pain, headaches, neck pain, palpitations or shortness of breath. Risk factors for coronary artery disease include smoking/tobacco exposure, sedentary lifestyle, dyslipidemia, family history and diabetes mellitus. Past treatments include ACE inhibitors, calcium  channel blockers and central alpha  agonists. The current treatment provides no improvement. Compliance problems include psychosocial issues.  Hypertensive end-organ damage includes kidney disease. Identifiable causes of hypertension include chronic renal disease.  Hyperlipidemia This is a chronic problem. The current episode started more than 1 year ago. Exacerbating diseases include chronic renal disease and diabetes. Pertinent negatives include no chest pain, myalgias  or shortness of breath. Current antihyperlipidemic treatment includes statins. Compliance problems include psychosocial issues.  Risk factors for coronary artery disease include dyslipidemia, diabetes mellitus, male sex, hypertension and a sedentary lifestyle.    Objective:    BP 132/84   Pulse (!) 44   Resp 18   Ht 5' 7 (1.702 m)   Wt 132 lb (59.9 kg)   SpO2 97%   BMI 20.67 kg/m   Wt Readings from Last 3 Encounters:  08/29/24 132 lb (59.9 kg)  08/01/24 132 lb (59.9 kg)  07/14/24 130 lb 6.4 oz (59.1 kg)     Physical Exam- Limited  Constitutional:  Body mass index is 20.67 kg/m. , not in acute distress, normal state of mind  Reluctant affect, not ready to quit smoking  Complete Blood Count (Most recent): Lab Results  Component Value Date   WBC 6.2 02/29/2024   HGB 15.6 02/29/2024   HCT 47.0 02/29/2024   MCV 96 02/29/2024   PLT 218 02/29/2024   Chemistry (most recent): Lab Results  Component Value Date   NA 142 08/21/2024   K 4.6 08/21/2024   CL 102 08/21/2024   CO2 25 08/21/2024   BUN 9 08/21/2024   CREATININE 1.20 08/21/2024   Diabetic Labs (most recent): Lab Results  Component Value Date   HGBA1C 6.3 (A) 08/29/2024   HGBA1C 6.3 02/28/2024   HGBA1C 6.3 (H) 06/09/2023   MICROALBUR 150 08/23/2020   MICROALBUR 9.9 08/20/2017   Lipid Panel     Component Value Date/Time   CHOL 103 08/21/2024 0806   TRIG 31 08/21/2024 0806   HDL 50 08/21/2024 0806   CHOLHDL 2.1 08/21/2024 0806   CHOLHDL 2.1 04/23/2022 0142   VLDL 4 04/23/2022  0142   LDLCALC 43 08/21/2024 0806   LDLCALC 56 12/12/2019 0905     Assessment & Plan:   1. Type 2 diabetes mellitus with microalbuminuria , with long-term current use of insulin  (HCC)  Chase Briggs presents with controlled glycemic profile without hypoglycemia.  His point-of-care A1c is stable at 6.3%.    He does not have any hypoglycemic episodes.  He is a chronic heavy smoker, remains at a high risk for more acute and chronic complications of diabetes which include CAD, CVA, CKD, retinopathy, and neuropathy. These are all discussed in detail with the patient.    Recent labs reviewed, stage 3 renal insufficiency. - I have re-counseled the patient on diet management  by adopting a carbohydrate restricted / protein rich  Diet.  - he acknowledges that there is a room for improvement in his food and drink choices. - Suggestion is made for him to avoid simple carbohydrates  from his diet including Cakes, Sweet Desserts, Ice Cream, Soda (diet and regular), Sweet Tea, Candies, Chips, Cookies, Store Bought Juices, Alcohol in Excess of  1-2 drinks a day, Artificial Sweeteners,  Coffee Creamer, and Sugar-free Products, Lemonade. This will help patient to have more stable blood glucose profile and potentially avoid unintended weight gain.   - Patient is advised to stick to a routine mealtimes to eat 3 meals  a day and avoid unnecessary snacks ( to snack only to correct hypoglycemia).  - I have approached patient with the following individualized plan to manage diabetes and patient agrees.  - He has benefited from insulin  initiation.   - Based on  his presentation with controlled glycemic profile, he will not need prandial insulin  for now.  He is advised to continue Lantus  12 units daily with breakfast.  He is encouraged and agrees to  continue monitoring blood glucose at least twice a day-daily before breakfast and at bedtime.    - He will call clinic if he registers blood glucose readings less  than 70 or greater than 200 mg/dL.  -   He has stage 3 renal insufficiency which has improved over time, will stay off of metformin for now.   -He is benefiting from low-dose glipizide .  He is advised to continue glipizide  2.5 mg XL p.o. daily at breakfast.     - Patient is not a candidate for  incretin therapy, nor SGLT2 inhibitors.  His previously documented mild renal insufficiency has resolved. - Patient specific target  for A1c; LDL, HDL, Triglycerides, were discussed in detail.  2) BP/HTN:  His blood pressure is controlled to target. He has 3 medications including losartan, clonidine , and amlodipine .  He continues to smoke heavily.   The patient was counseled on the dangers of tobacco use, and was advised to quit.  Reviewed strategies to maximize success, including removing cigarettes and smoking materials from environment.   3) Lipids/HPL: His most recent lipid panel showed LDL controlled at 43.  He is not on statins for now.     4)  Weight/Diet: His BMI is 20.67 kg/m -he is not a candidate for weight loss.     - He would benefit from addition of more complex carbohydrates and protein intake, information provided. CDE consult is requested.  5. Vitamin D  deficiency  - He he is on ongoing supplement with vitamin D3 5000 units daily.    6) Chronic Care/Health Maintenance:  -Patient is on ACEI/ARB and Statin medications and encouraged to continue to follow up with Ophthalmology, Podiatrist at least yearly or according to recommendations, and advised to  Quit  smoking. I have recommended yearly flu vaccine and pneumonia vaccination at least every 5 years; moderate intensity exercise for up to 150 minutes weekly; and  sleep for at least 7 hours a day. -He is counseled extensively for smoking cessation. The patient was counseled on the dangers of tobacco use, and was advised to quit.  Reviewed strategies to maximize success, including removing cigarettes and smoking materials from  environment.   - I advised patient to maintain close follow up with Tobie Suzzane POUR, MD for primary care needs.  - I have advised him to avoid over-the-counter pain medications to avoid additional risk for renal insufficiency.   I spent  25  minutes in the care of the patient today including review of labs from CMP, Lipids, Thyroid  Function, Hematology (current and previous including abstractions from other facilities); face-to-face time discussing  his blood glucose readings/logs, discussing hypoglycemia and hyperglycemia episodes and symptoms, medications doses, his options of short and long term treatment based on the latest standards of care / guidelines;  discussion about incorporating lifestyle medicine;  and documenting the encounter. Risk reduction counseling performed per USPSTF guidelines to reduce cardiovascular risk factors.     Please refer to Patient Instructions for Blood Glucose Monitoring and Insulin /Medications Dosing Guide  in media tab for additional information. Please  also refer to  Patient Self Inventory in the Media  tab for reviewed elements of pertinent patient history.  Chase Briggs participated in the discussions, expressed understanding, and voiced agreement with the above plans.  All questions were answered to his satisfaction. he is encouraged to contact clinic should he have any questions or concerns prior to his return visit.   Follow up plan: -Return  in about 6 months (around 02/27/2025) for Bring Meter/CGM Device/Logs- A1c in Office.  Ranny Earl, MD Phone: [phone]  Fax: [phone]  This note was partially dictated with voice recognition software. Similar sounding words can be transcribed inadequately or may not  be corrected upon review.  08/29/2024, 12:36 PM

## 2024-08-30 ENCOUNTER — Ambulatory Visit: Payer: Self-pay

## 2024-08-30 ENCOUNTER — Other Ambulatory Visit: Payer: Self-pay | Admitting: "Endocrinology

## 2024-08-30 MED ORDER — NITROFURANTOIN MONOHYD MACRO 100 MG PO CAPS: 100.0000 mg | ORAL_CAPSULE | Freq: Two times a day (BID) | ORAL | 0 refills | Status: DC

## 2024-08-30 NOTE — Telephone Encounter (Signed)
 Wife made aware of positive urine culture and abt sent to pharmacy per Dr. Sherrilee.

## 2024-09-05 ENCOUNTER — Encounter: Payer: Self-pay | Admitting: Urology

## 2024-09-05 NOTE — Patient Instructions (Signed)

## 2024-09-21 ENCOUNTER — Other Ambulatory Visit: Payer: Self-pay

## 2024-09-21 ENCOUNTER — Ambulatory Visit

## 2024-09-21 VITALS — BP 162/82 | HR 48 | Temp 98.1°F | Ht 67.0 in | Wt 130.0 lb

## 2024-09-21 DIAGNOSIS — I10 Essential (primary) hypertension: Secondary | ICD-10-CM

## 2024-09-21 DIAGNOSIS — R590 Localized enlarged lymph nodes: Secondary | ICD-10-CM

## 2024-09-21 DIAGNOSIS — H6592 Unspecified nonsuppurative otitis media, left ear: Secondary | ICD-10-CM

## 2024-09-21 DIAGNOSIS — R053 Chronic cough: Secondary | ICD-10-CM

## 2024-09-21 DIAGNOSIS — J432 Centrilobular emphysema: Secondary | ICD-10-CM

## 2024-09-21 MED ORDER — AMOXICILLIN-POT CLAVULANATE 875-125 MG PO TABS: 1.0000 | ORAL_TABLET | Freq: Two times a day (BID) | ORAL | 0 refills | Status: AC

## 2024-09-21 MED ORDER — BREZTRI AEROSPHERE 160-9-4.8 MCG/ACT IN AERO: 2.0000 | INHALATION_SPRAY | Freq: Two times a day (BID) | RESPIRATORY_TRACT | 6 refills | Status: DC

## 2024-09-21 MED ORDER — FLUTICASONE PROPIONATE 50 MCG/ACT NA SUSP: 2.0000 | Freq: Every day | NASAL | 6 refills | Status: AC

## 2024-09-21 MED ORDER — STIOLTO RESPIMAT 2.5-2.5 MCG/ACT IN AERS: 2.0000 | INHALATION_SPRAY | Freq: Every day | RESPIRATORY_TRACT | 6 refills | Status: DC

## 2024-09-21 NOTE — Progress Notes (Cosign Needed Addendum)
 "  Acute Office Visit  Subjective:     Patient ID: Chase Briggs, male    DOB: March 26, 1951, 74 y.o.   MRN: [id number]  Chief Complaint  Patient presents with   left jaw pain     Pain w/ movement started today    HPI Patient is in today for left jaw pain that started this morning.  Patient also endorses a persistent cough that he has had for over 2 weeks.  Reports shortness of breath and wheezing intermittently.  Describes the cough is nonproductive and is severe at nighttime.  Has stopped smoking to improve symptoms.  Has been using albuterol  inhaler 2-3 times daily.  Was seen by pulmonology on 07/14/2024, and was prescribed Stiolto inhaler for daily use.  Patient says that he used 1 inhaler but did not pick it up after this prescription.  Denies fever, chills, body aches, or any other associating symptoms.  Describes jaw pain as an aching pain that is mainly under his left jaw.  Has pain with chewing and movement of the jaw.  Reports that he had grinded his teeth in the past but he does have dentures.  Has a history of high blood pressure.  Does take his blood pressure medications as prescribed.  Takes his blood pressure at home and says that his numbers are 140 systolic.  Denies blurred vision or headaches.  Review of Systems  Constitutional:  Negative for chills, fatigue and fever.  HENT:  Negative for congestion, ear discharge, ear pain, rhinorrhea, sinus pressure, sinus pain and sore throat.   Eyes:  Negative for visual disturbance.  Respiratory:  Positive for cough, shortness of breath and wheezing. Negative for chest tightness.   Cardiovascular:  Negative for chest pain, palpitations and leg swelling.  Neurological:  Negative for dizziness, numbness and headaches.       Objective:    Today's Vitals   09/21/24 1606 09/21/24 1620  BP: (!) 149/70 (!) 162/82  Pulse: (!) 48   Temp: 98.1 F (36.7 C)   SpO2: 93%   Weight: 130 lb (59 kg)   Height: 5' 7 (1.702 m)    Body  mass index is 20.36 kg/m.   Physical Exam Vitals and nursing note reviewed.  Constitutional:      General: He is not in acute distress.    Appearance: Normal appearance. He is not ill-appearing.  HENT:     Right Ear: No middle ear effusion. Tympanic membrane is retracted. Tympanic membrane is not erythematous or bulging.     Left Ear: A middle ear effusion is present. Tympanic membrane is not erythematous, retracted or bulging.  Cardiovascular:     Rate and Rhythm: Normal rate and regular rhythm.     Heart sounds: Normal heart sounds, S1 normal and S2 normal. No murmur heard. Pulmonary:     Effort: Pulmonary effort is normal. No respiratory distress.     Breath sounds: Normal breath sounds. No wheezing.  Musculoskeletal:     Right lower leg: No edema.     Left lower leg: No edema.  Lymphadenopathy:     Cervical: Cervical adenopathy present.  Neurological:     Mental Status: He is alert.  Psychiatric:        Mood and Affect: Mood normal.        Behavior: Behavior normal.        Thought Content: Thought content normal.        Judgment: Judgment normal.    No results found for  any visits on 09/21/24.    Assessment & Plan:  1. Anterior cervical adenopathy (Primary) -Discussed with patient that left superior cervical lymph node was inflamed at this time, and this could be the source of jaw pain.  Advised patient that this could be due to a previous sickness.  Advised patient to monitor this and it should resolve in 2 weeks. Agrees.   2. Fluid level behind tympanic membrane of left ear -Recommended Flonase  administration to help with congestion and middle ear effusion at this time. - Instructed patient on the use of Flonase .  Patient agrees with plan.  3. Centrilobular emphysema (HCC) Patient looks good on exam today.  Patient has history of emphysema.  Due to duration of symptoms, will treat like a COPD exacerbation at this time.  Treating with antibiotics and Breztri  inhaler at  this time.  Discussed with patient.  Agrees with plan. -Advised patient to use albuterol  nebulizer only as needed for acute shortness of breath or wheezing. Discussed that daily or excessive use may lead to decreased bronchodilator effectiveness over time. Advised patient that if they are using their albuterol  more than twice per week to notify the office, as this may be a sign that their COPD is not being controlled. Patient agrees.  -Prescribed antibiotics due to concern for infectious trigger contributing to exacerbation. Risks, benefits, and side effects reviewed. Patient verbalized understanding.  -ED precautions reviewed including continuous shortness of breath or wheezing despite albuterol  inhaler use or cyanosis, and to seek immediate care if experiencing any of these symptoms.    4. Essential hypertension -Patient hypertensive on exam today.  Patient taking antihypertensive medications as prescribed.  Advised patient to monitor blood pressure at home and to let his PCP know if systolic blood pressure is >150.  Patient agrees with plan.   Meds ordered this encounter  Medications   fluticasone  (FLONASE ) 50 MCG/ACT nasal spray    Sig: Place 2 sprays into both nostrils daily.    Dispense:  16 g    Refill:  6   DISCONTD: Tiotropium Bromide-Olodaterol (STIOLTO RESPIMAT ) 2.5-2.5 MCG/ACT AERS    Sig: Inhale 2 puffs into the lungs daily.    Dispense:  4 each    Refill:  6   budesonide-glycopyrrolate -formoterol (BREZTRI  AEROSPHERE) 160-9-4.8 MCG/ACT AERO inhaler    Sig: Inhale 2 puffs into the lungs 2 (two) times daily.    Dispense:  10.7 g    Refill:  6   amoxicillin -clavulanate (AUGMENTIN ) 875-125 MG tablet    Sig: Take 1 tablet by mouth 2 (two) times daily for 5 days.    Dispense:  10 tablet    Refill:  0     Return if symptoms worsen or fail to improve.   Damien KATHEE Pringle, FNP  "

## 2024-09-21 NOTE — Patient Instructions (Addendum)
-  Take symbicort inhaler daily -Take albuterol  as needed, if taking more than 3 times per week let your doctor know.  --Check blood pressure. Keep under 150 for the top number.  -Use flonase  nasal spray daily.

## 2024-09-28 ENCOUNTER — Encounter: Payer: Self-pay | Admitting: Internal Medicine

## 2024-09-28 ENCOUNTER — Ambulatory Visit: Admitting: Internal Medicine

## 2024-09-28 ENCOUNTER — Other Ambulatory Visit: Payer: Self-pay | Admitting: Internal Medicine

## 2024-09-28 VITALS — BP 138/84 | HR 60 | Ht 67.0 in | Wt 132.4 lb

## 2024-09-28 DIAGNOSIS — Z794 Long term (current) use of insulin: Secondary | ICD-10-CM | POA: Diagnosis not present

## 2024-09-28 DIAGNOSIS — J3089 Other allergic rhinitis: Secondary | ICD-10-CM | POA: Diagnosis not present

## 2024-09-28 DIAGNOSIS — B370 Candidal stomatitis: Secondary | ICD-10-CM

## 2024-09-28 DIAGNOSIS — I10 Essential (primary) hypertension: Secondary | ICD-10-CM

## 2024-09-28 DIAGNOSIS — J432 Centrilobular emphysema: Secondary | ICD-10-CM | POA: Diagnosis not present

## 2024-09-28 DIAGNOSIS — E1169 Type 2 diabetes mellitus with other specified complication: Secondary | ICD-10-CM | POA: Diagnosis not present

## 2024-09-28 MED ORDER — NYSTATIN 100000 UNIT/ML MT SUSP: 5.0000 mL | Freq: Three times a day (TID) | OROMUCOSAL | 0 refills | Status: AC

## 2024-09-28 MED ORDER — NYSTATIN 100000 UNIT/ML MT SUSP: 5.0000 mL | Freq: Three times a day (TID) | OROMUCOSAL | 0 refills | Status: DC

## 2024-09-28 NOTE — Progress Notes (Signed)
 "  Acute Office Visit  Subjective:    Patient ID: Chase Briggs, male    DOB: February 24, 1951, 74 y.o.   MRN: [id number]  Chief Complaint  Patient presents with   Mouth Lesions    Pt c/o sores in mouth, states he was taking steroid medication that may have caused this. (Bretzeri and nasal spray)    HPI Patient is in today for complaint of mouth sores and whitish patches in the oral cavity, near the lips.  Of note, he was recently given Breztri  for COPD by NP at a different practice, but he has not been rinsing his mouth after each use.  He was previously doing well with Stiolto inhaler, he has been evaluated by pulmonology as well.  Past Medical History:  Diagnosis Date   Diabetes mellitus    Fatty liver    GERD (gastroesophageal reflux disease)    HTN (hypertension)    Hyperlipidemia    Insomnia    Renal insufficiency     Past Surgical History:  Procedure Laterality Date   ABDOMINAL AORTOGRAM W/LOWER EXTREMITY N/A 08/22/2018   Procedure: ABDOMINAL AORTOGRAM W/LOWER EXTREMITY;  Surgeon: Sheree Penne Bruckner, MD;  Location: [facility] INVASIVE CV LAB;  Service: Cardiovascular;  Laterality: N/A;   BACK SURGERY     CARPAL TUNNEL WITH CUBITAL TUNNEL Right 03/21/2019   Procedure: RIGHT CARPAL TUNNEL AND CUBITAL  TUNNEL RELEASES;  Surgeon: Murrell Kuba, MD;  Location: [facility];  Service: Orthopedics;  Laterality: Right;  AXILLARY BLOCK   COLONOSCOPY  01/2007   Dr. Mavis, reviewed report, mild diverticulosis. Prep adequate. Next TCS due 01/2017   ESOPHAGOGASTRODUODENOSCOPY N/A 10/21/2012   DOQ:[id number] GASTRITIS   MASS EXCISION Right 10/26/2012   Procedure: EXCISION MASS;  Surgeon: Oneil DELENA Mavis, MD;  Location: [facility];  Service: General;  Laterality: Right;   PERIPHERAL VASCULAR INTERVENTION  08/22/2018   Procedure: PERIPHERAL VASCULAR INTERVENTION;  Surgeon: Sheree Penne Bruckner, MD;  Location: [facility] INVASIVE CV LAB;  Service: Cardiovascular;;  bilateral external iliac  stents   scalp mass  4-5 yrs ago   Dr Mavis COWBOY NERVE TRANSPOSITION Right 03/21/2019   Procedure: DECOMPRESSION RIGHT ULNAR NERVE;  Surgeon: Murrell Kuba, MD;  Location: [facility];  Service: Orthopedics;  Laterality: Right;    Family History  Problem Relation Age of Onset   Diabetes Mother    Diabetes Father    Diabetes Brother    Colon cancer Neg Hx    Liver disease Neg Hx     Social History   Socioeconomic History   Marital status: Married    Spouse name: Not on file   Number of children: 2   Years of education: Not on file   Highest education level: Not on file  Occupational History   Occupation: reitred    Comment: [employer], heavy lifting  Tobacco Use   Smoking status: Every Day    Current packs/day: 0.50    Average packs/day: 0.5 packs/day for 55.0 years (27.5 ttl pk-yrs)    Types: Cigarettes    Start date: 10   Smokeless tobacco: Never  Vaping Use   Vaping status: Never Used  Substance and Sexual Activity   Alcohol use: No    Comment: hx heavy etoh x 3 yrs, quit 30+ yrs ago   Drug use: No   Sexual activity: Yes    Birth control/protection: None  Other Topics Concern   Not on file  Social History Narrative   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 Highest level of education: 12th grade. Married for 12 years. Has 2 sons (33 and 21). Lives with wife.   Social Drivers of Health   Tobacco Use: High Risk (09/28/2024)   Patient History    Smoking Tobacco Use: Every Day    Smokeless Tobacco Use: Never    Passive Exposure: Not on file  Financial Resource Strain: Low Risk (05/25/2024)   Overall Financial Resource Strain (CARDIA)    Difficulty of Paying Living Expenses: Not hard at all  Food Insecurity: No Food Insecurity (05/25/2024)   Epic    Worried About Programme Researcher, Broadcasting/film/video in the Last Year: Never true    Ran Out of Food in the Last Year: Never true  Transportation Needs: No Transportation Needs (05/25/2024)   Epic    Lack of Transportation  (Medical): No    Lack of Transportation (Non-Medical): No  Physical Activity: Sufficiently Active (05/25/2024)   Exercise Vital Sign    Days of Exercise per Week: 7 days    Minutes of Exercise per Session: 30 min  Stress: No Stress Concern Present (05/25/2024)   [employer] of Occupational Health - Occupational Stress Questionnaire    Feeling of Stress: Not at all  Social Connections: Socially Integrated (05/25/2024)   Social Connection and Isolation Panel    Frequency of Communication with Friends and Family: More than three times a week    Frequency of Social Gatherings with Friends and Family: More than three times a week    Attends Religious Services: More than 4 times per year    Active Member of Clubs or Organizations: Yes    Attends Banker Meetings: More than 4 times per year    Marital Status: Married  Catering Manager Violence: Not At Risk (05/25/2024)   Epic    Fear of Current or Ex-Partner: No    Emotionally Abused: No    Physically Abused: No    Sexually Abused: No  Depression (PHQ2-9): Low Risk (09/28/2024)   Depression (PHQ2-9)    PHQ-2 Score: 2  Recent Concern: Depression (PHQ2-9) - Medium Risk (09/21/2024)   Depression (PHQ2-9)    PHQ-2 Score: 5  Alcohol Screen: Low Risk (05/25/2024)   Alcohol Screen    Last Alcohol Screening Score (AUDIT): 0  Housing: Low Risk (05/25/2024)   Epic    Unable to Pay for Housing in the Last Year: No    Number of Times Moved in the Last Year: 0    Homeless in the Last Year: No  Utilities: Not At Risk (05/25/2024)   Epic    Threatened with loss of utilities: No  Health Literacy: Adequate Health Literacy (05/25/2024)   B1300 Health Literacy    Frequency of need for help with medical instructions: Never    Outpatient Medications Prior to Visit  Medication Sig Dispense Refill   albuterol  (VENTOLIN  HFA) 108 (90 Base) MCG/ACT inhaler INHALE 2 PUFFS INTO THE LUNGS EVERY 6 HOURS AS NEEDED FOR WHEEZING OR SHORTNESS OF BREATH  17 g 0   amLODipine  (NORVASC ) 10 MG tablet TAKE 1 TABLET(10 MG) BY MOUTH DAILY 90 tablet 3   aspirin  EC 81 MG tablet Take 1 tablet (81 mg total) by mouth daily with breakfast. 30 tablet 11   atorvastatin  (LIPITOR) 10 MG tablet TAKE 1 TABLET(10 MG) BY MOUTH DAILY 90 tablet 1   Cholecalciferol (VITAMIN D ) 125 MCG (5000 UT) CAPS Take 2,000 Units by mouth daily. 30 capsule 3   cloNIDine  (CATAPRES ) 0.1 MG  tablet TAKE 1 TABLET(0.1 MG) BY MOUTH TWICE DAILY 180 tablet 3   clopidogrel  (PLAVIX ) 75 MG tablet TAKE 1 TABLET BY MOUTH DAILY 90 tablet 3   EPINEPHrine  0.3 mg/0.3 mL IJ SOAJ injection Inject 0.3 mg into the muscle as needed for anaphylaxis. 1 each 1   ferrous sulfate  325 (65 FE) MG EC tablet Take 325 mg by mouth daily with breakfast.     fluticasone  (FLONASE ) 50 MCG/ACT nasal spray Place 2 sprays into both nostrils daily. 16 g 6   glipiZIDE  (GLUCOTROL  XL) 2.5 MG 24 hr tablet TAKE 1 TABLET(2.5 MG) BY MOUTH DAILY WITH BREAKFAST 90 tablet 0   glucose blood test strip 1 each by Other route 2 (two) times daily. Use as instructed bid E11.65 Relion Premier 100 each 5   Lancets MISC 1 each by Does not apply route 2 (two) times daily. E11.65 Relion Premier 100 each 5   LANTUS  SOLOSTAR 100 UNIT/ML Solostar Pen INJECT 12 UNITS UNDER THE SKIN AT BEDTIME 15 mL 2   lisinopril  (ZESTRIL ) 40 MG tablet TAKE 1 TABLET(40 MG) BY MOUTH DAILY 90 tablet 1   mirtazapine  (REMERON ) 15 MG tablet TAKE 1 TABLET(15 MG) BY MOUTH AT BEDTIME 30 tablet 3   mupirocin  ointment (BACTROBAN ) 2 % Apply 1 Application topically 2 (two) times daily. Over upper lip area. 22 g 0   SURE COMFORT PEN NEEDLES 32G X 4 MM MISC USE ONCE DAILY 100 each 3   Tiotropium Bromide-Olodaterol (STIOLTO RESPIMAT ) 2.5-2.5 MCG/ACT AERS Inhale 2 puffs into the lungs daily.     tiZANidine  (ZANAFLEX ) 4 MG tablet TAKE 1 TABLET(4 MG) BY MOUTH TWICE DAILY AS NEEDED FOR MUSCLE SPASMS 60 tablet 2   budesonide-glycopyrrolate -formoterol (BREZTRI  AEROSPHERE) 160-9-4.8  MCG/ACT AERO inhaler Inhale 2 puffs into the lungs 2 (two) times daily. 10.7 g 6   famotidine  (PEPCID ) 20 MG tablet Take 1 tablet (20 mg total) by mouth 2 (two) times daily. 60 tablet 2   nicotine  (NICODERM CQ  - DOSED IN MG/24 HOURS) 14 mg/24hr patch RX #2 Weeks 5-6: 14 mg x 1 patch daily. Wear for 24 hours. If you have sleep disturbances, remove at bedtime. 14 patch 0   nicotine  (NICODERM CQ  - DOSED IN MG/24 HOURS) 21 mg/24hr patch RX #1 Weeks 1-4: 21 mg x 1 patch daily. Wear for 24 hours. If you have sleep disturbances, remove at bedtime. 28 patch 0   nicotine  (NICODERM CQ  - DOSED IN MG/24 HR) 7 mg/24hr patch RX #3 Weeks 7-8: 7 mg x 1 patch daily. Wear for 24 hours. If you have sleep disturbances, remove at bedtime. 14 patch 0   nicotine  polacrilex (NICORETTE ) 4 MG gum RX #1 Weeks 1-6: 1 piece every 1-2 hours.Use at least 9 pieces of gum per day for the first 6 weeks. Max 24 pieces per day. 1008 each 0   nitrofurantoin , macrocrystal-monohydrate, (MACROBID ) 100 MG capsule Take 1 capsule (100 mg total) by mouth 2 (two) times daily. 14 capsule 0   No facility-administered medications prior to visit.    Allergies[1]  Review of Systems  Constitutional:  Positive for fatigue. Negative for chills and fever.  HENT:  Positive for mouth sores. Negative for congestion, postnasal drip and sore throat.   Respiratory:  Negative for cough and shortness of breath.   Cardiovascular:  Negative for chest pain and palpitations.  Gastrointestinal:  Positive for constipation. Negative for diarrhea and vomiting.  Genitourinary:  Negative for dysuria and hematuria.  Musculoskeletal:  Positive for back pain. Negative  for neck pain and neck stiffness.  Skin:  Negative for rash.  Neurological:  Negative for dizziness and weakness.  Psychiatric/Behavioral:  Positive for sleep disturbance. Negative for agitation and behavioral problems.        Objective:    Physical Exam Vitals reviewed.  Constitutional:       General: He is not in acute distress.    Appearance: He is not diaphoretic.  HENT:     Head: Normocephalic and atraumatic.     Nose: No congestion.     Mouth/Throat:     Mouth: Mucous membranes are moist. Oral lesions (Whitish patches near upper and lower lips) present.  Eyes:     General: No scleral icterus.    Extraocular Movements: Extraocular movements intact.  Cardiovascular:     Rate and Rhythm: Normal rate. Rhythm irregular.     Heart sounds: Normal heart sounds. No murmur heard. Pulmonary:     Breath sounds: Normal breath sounds. No wheezing or rales.  Musculoskeletal:     Cervical back: Neck supple. No tenderness.     Right lower leg: No edema.     Left lower leg: No edema.  Skin:    General: Skin is warm.     Findings: No rash.     Comments: Clubbing of fingernails  Neurological:     General: No focal deficit present.     Mental Status: He is alert and oriented to person, place, and time.     Sensory: No sensory deficit.     Motor: No weakness.  Psychiatric:        Mood and Affect: Mood normal.        Behavior: Behavior normal.     BP 138/84 (BP Location: Left Arm)   Pulse 60   Ht 5' 7 (1.702 m)   Wt 132 lb 6.4 oz (60.1 kg)   SpO2 94%   BMI 20.74 kg/m  Wt Readings from Last 3 Encounters:  09/28/24 132 lb 6.4 oz (60.1 kg)  09/21/24 130 lb (59 kg)  08/29/24 132 lb (59.9 kg)        Assessment & Plan:   Problem List Items Addressed This Visit       Cardiovascular and Mediastinum   Essential hypertension   BP Readings from Last 1 Encounters:  09/28/24 138/84   Elevated today as he has not taken his medications today Usually well-controlled with amlodipine  10 mg QD, lisinopril  40 mg QD and clonidine  0.1 mg BID at home Followed by cardiology Counseled for compliance with the medications Advised DASH diet and moderate exercise/walking, at least 150 mins/week        Respiratory   Allergic rhinitis   His nasal congestion and postnasal drip likely  due to seasonal allergies, continue Flonase  for now      Centrilobular emphysema (HCC)   Overall better controlled, has mild dyspnea on exertion recently Has Albuterol  PRN for dyspnea or wheezing DC Breztri , likely has oral candidiasis due to inhaled corticosteroid Restart Stiolto as maintenance inhaler, was evaluated by pulmonology- Dr. Catherine      Relevant Medications   magic mouthwash (nystatin , lidocaine , diphenhydrAMINE, alum & mag hydroxide) suspension   Tiotropium Bromide-Olodaterol (STIOLTO RESPIMAT ) 2.5-2.5 MCG/ACT AERS     Digestive   Oral candidiasis - Primary   Magic mouthwash containing nystatin  suspension -swish and spit prescribed Advised to rinse mouth after each use of inhaler      Relevant Medications   magic mouthwash (nystatin , lidocaine , diphenhydrAMINE, alum & mag hydroxide)  suspension     Endocrine   Type 2 diabetes mellitus with other specified complication (HCC)   Lab Results  Component Value Date   HGBA1C 6.3 (A) 08/29/2024   Associated with HTN and PAD Well-controlled with Lantus  12 units every morning and Glipizide  2.5 mg QD, followed by Dr. Lenis Advised to follow diabetic diet On statin and ACEi F/u CMP and lipid panel Diabetic eye exam: Advised to follow up with Ophthalmology for diabetic eye exam        Meds ordered this encounter  Medications   DISCONTD: magic mouthwash (nystatin , lidocaine , diphenhydrAMINE, alum & mag hydroxide) suspension    Sig: Swish and spit 5 mLs 3 (three) times daily.    Dispense:  180 mL    Refill:  0   magic mouthwash (nystatin , lidocaine , diphenhydrAMINE, alum & mag hydroxide) suspension    Sig: Swish and spit 5 mLs 3 (three) times daily.    Dispense:  180 mL    Refill:  0     Kacy Hegna MARLA Blanch, MD     [1] No Known Allergies  "

## 2024-09-28 NOTE — Patient Instructions (Signed)
 Please stop using Breztri  for now.  Please continue using Stiolto for now.  Please start using mouthwash for mouth sores.

## 2024-09-28 NOTE — Assessment & Plan Note (Signed)
 His nasal congestion and postnasal drip likely due to seasonal allergies, continue Flonase  for now

## 2024-09-28 NOTE — Assessment & Plan Note (Signed)
 BP Readings from Last 1 Encounters:  09/28/24 138/84   Elevated today as he has not taken his medications today Usually well-controlled with amlodipine  10 mg QD, lisinopril  40 mg QD and clonidine  0.1 mg BID at home Followed by cardiology Counseled for compliance with the medications Advised DASH diet and moderate exercise/walking, at least 150 mins/week

## 2024-09-28 NOTE — Assessment & Plan Note (Signed)
 Magic mouthwash containing nystatin  suspension -swish and spit prescribed Advised to rinse mouth after each use of inhaler

## 2024-09-28 NOTE — Assessment & Plan Note (Addendum)
 Overall better controlled, has mild dyspnea on exertion recently Has Albuterol  PRN for dyspnea or wheezing DC Breztri , likely has oral candidiasis due to inhaled corticosteroid Restart Stiolto as maintenance inhaler, was evaluated by pulmonology- Dr. Catherine

## 2024-09-28 NOTE — Assessment & Plan Note (Signed)
 Lab Results  Component Value Date   HGBA1C 6.3 (A) 08/29/2024   Associated with HTN and PAD Well-controlled with Lantus  12 units every morning and Glipizide  2.5 mg QD, followed by Dr. Lenis Advised to follow diabetic diet On statin and ACEi F/u CMP and lipid panel Diabetic eye exam: Advised to follow up with Ophthalmology for diabetic eye exam

## 2024-10-02 ENCOUNTER — Other Ambulatory Visit: Payer: Self-pay | Admitting: Internal Medicine

## 2024-10-02 DIAGNOSIS — G4700 Insomnia, unspecified: Secondary | ICD-10-CM

## 2024-10-14 ENCOUNTER — Other Ambulatory Visit: Payer: Self-pay | Admitting: "Endocrinology

## 2024-10-19 ENCOUNTER — Other Ambulatory Visit: Payer: Self-pay | Admitting: Family Medicine

## 2024-10-19 ENCOUNTER — Other Ambulatory Visit: Payer: Self-pay | Admitting: "Endocrinology

## 2024-10-19 DIAGNOSIS — M545 Low back pain, unspecified: Secondary | ICD-10-CM

## 2024-10-24 ENCOUNTER — Ambulatory Visit: Admitting: Pulmonary Disease

## 2024-10-24 ENCOUNTER — Ambulatory Visit (HOSPITAL_COMMUNITY)

## 2025-01-29 ENCOUNTER — Ambulatory Visit: Admitting: Internal Medicine

## 2025-02-27 ENCOUNTER — Ambulatory Visit: Admitting: "Endocrinology

## 2025-05-29 ENCOUNTER — Ambulatory Visit

## 2025-08-06 ENCOUNTER — Other Ambulatory Visit

## 2025-08-15 ENCOUNTER — Ambulatory Visit: Admitting: Urology
